# Patient Record
Sex: Female | Born: 1935 | Race: White | Hispanic: No | Smoking: Former smoker
Health system: Southern US, Community
[De-identification: ages and names within clinical notes are randomized; demographics above are authoritative.]

## PROBLEM LIST (undated history)

## (undated) DIAGNOSIS — R600 Localized edema: Secondary | ICD-10-CM

## (undated) DIAGNOSIS — K5909 Other constipation: Secondary | ICD-10-CM

## (undated) DIAGNOSIS — M810 Age-related osteoporosis without current pathological fracture: Secondary | ICD-10-CM

## (undated) DIAGNOSIS — L570 Actinic keratosis: Secondary | ICD-10-CM

## (undated) DIAGNOSIS — I340 Nonrheumatic mitral (valve) insufficiency: Secondary | ICD-10-CM

## (undated) DIAGNOSIS — M659 Unspecified synovitis and tenosynovitis, unspecified site: Secondary | ICD-10-CM

## (undated) DIAGNOSIS — M1990 Unspecified osteoarthritis, unspecified site: Secondary | ICD-10-CM

## (undated) DIAGNOSIS — H3530 Unspecified macular degeneration: Secondary | ICD-10-CM

## (undated) DIAGNOSIS — I5032 Chronic diastolic (congestive) heart failure: Secondary | ICD-10-CM

## (undated) DIAGNOSIS — K219 Gastro-esophageal reflux disease without esophagitis: Secondary | ICD-10-CM

## (undated) DIAGNOSIS — E871 Hypo-osmolality and hyponatremia: Secondary | ICD-10-CM

## (undated) DIAGNOSIS — E785 Hyperlipidemia, unspecified: Secondary | ICD-10-CM

## (undated) DIAGNOSIS — I10 Essential (primary) hypertension: Secondary | ICD-10-CM

## (undated) DIAGNOSIS — M549 Dorsalgia, unspecified: Secondary | ICD-10-CM

## (undated) HISTORY — DX: Localized edema: R60.0

## (undated) HISTORY — PX: INGUINAL HERNIA REPAIR: SUR1180

## (undated) HISTORY — DX: Actinic keratosis: L57.0

## (undated) HISTORY — DX: Other constipation: K59.09

## (undated) HISTORY — PX: SYNOVECTOMY: SHX133

## (undated) HISTORY — DX: Unspecified synovitis and tenosynovitis, unspecified site: M65.90

## (undated) HISTORY — DX: Nonrheumatic mitral (valve) insufficiency: I34.0

## (undated) HISTORY — PX: REPLACEMENT TOTAL KNEE BILATERAL: SUR1225

## (undated) HISTORY — DX: Age-related osteoporosis without current pathological fracture: M81.0

## (undated) HISTORY — DX: Dorsalgia, unspecified: M54.9

## (undated) HISTORY — DX: Unspecified macular degeneration: H35.30

## (undated) HISTORY — PX: OTHER SURGICAL HISTORY: SHX169

## (undated) HISTORY — DX: Unspecified osteoarthritis, unspecified site: M19.90

## (undated) HISTORY — DX: Synovitis and tenosynovitis, unspecified: M65.9

## (undated) HISTORY — PX: CARPAL TUNNEL RELEASE: SHX101

## (undated) HISTORY — DX: Hyperlipidemia, unspecified: E78.5

## (undated) HISTORY — PX: TONSILLECTOMY: SUR1361

## (undated) HISTORY — DX: Gastro-esophageal reflux disease without esophagitis: K21.9

## (undated) HISTORY — DX: Hypo-osmolality and hyponatremia: E87.1

---

## 1998-05-09 ENCOUNTER — Ambulatory Visit (HOSPITAL_COMMUNITY): Admission: RE | Admit: 1998-05-09 | Discharge: 1998-05-09 | Payer: Self-pay | Admitting: Internal Medicine

## 1998-06-03 ENCOUNTER — Emergency Department (HOSPITAL_COMMUNITY): Admission: EM | Admit: 1998-06-03 | Discharge: 1998-06-03 | Payer: Self-pay | Admitting: Emergency Medicine

## 1998-06-14 ENCOUNTER — Ambulatory Visit (HOSPITAL_COMMUNITY): Admission: RE | Admit: 1998-06-14 | Discharge: 1998-06-14 | Payer: Self-pay | Admitting: *Deleted

## 1999-07-23 ENCOUNTER — Encounter: Payer: Self-pay | Admitting: Internal Medicine

## 1999-07-23 ENCOUNTER — Ambulatory Visit (HOSPITAL_COMMUNITY): Admission: RE | Admit: 1999-07-23 | Discharge: 1999-07-23 | Payer: Self-pay | Admitting: Internal Medicine

## 1999-08-18 ENCOUNTER — Ambulatory Visit (HOSPITAL_COMMUNITY): Admission: RE | Admit: 1999-08-18 | Discharge: 1999-08-18 | Payer: Self-pay | Admitting: *Deleted

## 1999-09-29 ENCOUNTER — Encounter: Payer: Self-pay | Admitting: *Deleted

## 1999-09-29 ENCOUNTER — Ambulatory Visit (HOSPITAL_COMMUNITY): Admission: RE | Admit: 1999-09-29 | Discharge: 1999-09-29 | Payer: Self-pay | Admitting: *Deleted

## 2000-05-20 ENCOUNTER — Ambulatory Visit (HOSPITAL_COMMUNITY): Admission: RE | Admit: 2000-05-20 | Discharge: 2000-05-20 | Payer: Self-pay | Admitting: *Deleted

## 2000-05-20 ENCOUNTER — Encounter: Payer: Self-pay | Admitting: *Deleted

## 2001-06-09 ENCOUNTER — Encounter: Payer: Self-pay | Admitting: Orthopedic Surgery

## 2001-06-14 ENCOUNTER — Inpatient Hospital Stay (HOSPITAL_COMMUNITY): Admission: RE | Admit: 2001-06-14 | Discharge: 2001-06-20 | Payer: Self-pay | Admitting: Orthopedic Surgery

## 2001-06-20 ENCOUNTER — Inpatient Hospital Stay
Admission: RE | Admit: 2001-06-20 | Discharge: 2001-06-27 | Payer: Self-pay | Admitting: Physical Medicine & Rehabilitation

## 2001-07-13 ENCOUNTER — Encounter: Admission: RE | Admit: 2001-07-13 | Discharge: 2001-08-05 | Payer: Self-pay | Admitting: Orthopedic Surgery

## 2001-12-15 ENCOUNTER — Encounter: Payer: Self-pay | Admitting: Orthopedic Surgery

## 2001-12-20 ENCOUNTER — Inpatient Hospital Stay (HOSPITAL_COMMUNITY): Admission: RE | Admit: 2001-12-20 | Discharge: 2001-12-26 | Payer: Self-pay | Admitting: Orthopedic Surgery

## 2001-12-26 ENCOUNTER — Inpatient Hospital Stay (HOSPITAL_COMMUNITY)
Admission: RE | Admit: 2001-12-26 | Discharge: 2002-01-03 | Payer: Self-pay | Admitting: Physical Medicine & Rehabilitation

## 2002-01-18 ENCOUNTER — Encounter: Admission: RE | Admit: 2002-01-18 | Discharge: 2002-04-18 | Payer: Self-pay | Admitting: Orthopedic Surgery

## 2002-02-24 ENCOUNTER — Encounter: Admission: RE | Admit: 2002-02-24 | Discharge: 2002-02-24 | Payer: Self-pay | Admitting: Internal Medicine

## 2002-02-24 ENCOUNTER — Encounter: Payer: Self-pay | Admitting: Internal Medicine

## 2002-08-01 ENCOUNTER — Ambulatory Visit (HOSPITAL_BASED_OUTPATIENT_CLINIC_OR_DEPARTMENT_OTHER): Admission: RE | Admit: 2002-08-01 | Discharge: 2002-08-01 | Payer: Self-pay | Admitting: Orthopedic Surgery

## 2002-08-17 ENCOUNTER — Other Ambulatory Visit: Admission: RE | Admit: 2002-08-17 | Discharge: 2002-08-17 | Payer: Self-pay | Admitting: Internal Medicine

## 2003-12-21 ENCOUNTER — Observation Stay (HOSPITAL_COMMUNITY): Admission: RE | Admit: 2003-12-21 | Discharge: 2003-12-23 | Payer: Self-pay | Admitting: General Surgery

## 2004-04-24 ENCOUNTER — Ambulatory Visit (HOSPITAL_COMMUNITY): Admission: RE | Admit: 2004-04-24 | Discharge: 2004-04-24 | Payer: Self-pay | Admitting: Internal Medicine

## 2004-06-09 ENCOUNTER — Ambulatory Visit (HOSPITAL_COMMUNITY): Admission: RE | Admit: 2004-06-09 | Discharge: 2004-06-09 | Payer: Self-pay | Admitting: *Deleted

## 2004-07-27 ENCOUNTER — Emergency Department (HOSPITAL_COMMUNITY): Admission: EM | Admit: 2004-07-27 | Discharge: 2004-07-27 | Payer: Self-pay | Admitting: Emergency Medicine

## 2004-08-31 ENCOUNTER — Emergency Department (HOSPITAL_COMMUNITY): Admission: EM | Admit: 2004-08-31 | Discharge: 2004-08-31 | Payer: Self-pay | Admitting: Emergency Medicine

## 2005-10-09 ENCOUNTER — Other Ambulatory Visit: Admission: RE | Admit: 2005-10-09 | Discharge: 2005-10-09 | Payer: Self-pay | Admitting: Internal Medicine

## 2005-12-08 ENCOUNTER — Encounter: Admission: RE | Admit: 2005-12-08 | Discharge: 2005-12-08 | Payer: Self-pay | Admitting: Internal Medicine

## 2006-07-15 ENCOUNTER — Emergency Department (HOSPITAL_COMMUNITY): Admission: EM | Admit: 2006-07-15 | Discharge: 2006-07-15 | Payer: Self-pay | Admitting: Emergency Medicine

## 2007-01-04 ENCOUNTER — Ambulatory Visit (HOSPITAL_COMMUNITY): Admission: RE | Admit: 2007-01-04 | Discharge: 2007-01-04 | Payer: Self-pay | Admitting: *Deleted

## 2007-09-22 ENCOUNTER — Encounter: Admission: RE | Admit: 2007-09-22 | Discharge: 2007-09-22 | Payer: Self-pay | Admitting: Orthopedic Surgery

## 2007-09-27 ENCOUNTER — Encounter (INDEPENDENT_AMBULATORY_CARE_PROVIDER_SITE_OTHER): Payer: Self-pay | Admitting: Orthopedic Surgery

## 2007-09-27 ENCOUNTER — Ambulatory Visit (HOSPITAL_BASED_OUTPATIENT_CLINIC_OR_DEPARTMENT_OTHER): Admission: RE | Admit: 2007-09-27 | Discharge: 2007-09-27 | Payer: Self-pay | Admitting: Orthopedic Surgery

## 2009-02-19 ENCOUNTER — Ambulatory Visit (HOSPITAL_COMMUNITY): Admission: RE | Admit: 2009-02-19 | Discharge: 2009-02-19 | Payer: Self-pay | Admitting: *Deleted

## 2009-09-09 ENCOUNTER — Encounter: Payer: Self-pay | Admitting: Cardiovascular Disease

## 2010-01-07 ENCOUNTER — Encounter: Payer: Self-pay | Admitting: Cardiovascular Disease

## 2010-01-14 ENCOUNTER — Encounter: Admission: RE | Admit: 2010-01-14 | Discharge: 2010-01-14 | Payer: Self-pay | Admitting: Internal Medicine

## 2010-01-14 ENCOUNTER — Encounter: Payer: Self-pay | Admitting: Cardiovascular Disease

## 2010-01-15 DIAGNOSIS — K219 Gastro-esophageal reflux disease without esophagitis: Secondary | ICD-10-CM

## 2010-01-15 DIAGNOSIS — Z8719 Personal history of other diseases of the digestive system: Secondary | ICD-10-CM

## 2010-01-15 DIAGNOSIS — R0789 Other chest pain: Secondary | ICD-10-CM

## 2010-01-15 DIAGNOSIS — M810 Age-related osteoporosis without current pathological fracture: Secondary | ICD-10-CM

## 2010-01-15 DIAGNOSIS — M1990 Unspecified osteoarthritis, unspecified site: Secondary | ICD-10-CM

## 2010-01-15 DIAGNOSIS — M549 Dorsalgia, unspecified: Secondary | ICD-10-CM | POA: Insufficient documentation

## 2010-01-16 ENCOUNTER — Ambulatory Visit: Payer: Self-pay | Admitting: Cardiovascular Disease

## 2010-01-16 DIAGNOSIS — R011 Cardiac murmur, unspecified: Secondary | ICD-10-CM | POA: Insufficient documentation

## 2010-01-22 ENCOUNTER — Encounter: Admission: RE | Admit: 2010-01-22 | Discharge: 2010-01-22 | Payer: Self-pay | Admitting: Gastroenterology

## 2010-02-03 ENCOUNTER — Telehealth (INDEPENDENT_AMBULATORY_CARE_PROVIDER_SITE_OTHER): Payer: Self-pay | Admitting: Radiology

## 2010-02-04 ENCOUNTER — Ambulatory Visit: Payer: Self-pay

## 2010-02-04 ENCOUNTER — Encounter: Payer: Self-pay | Admitting: Cardiovascular Disease

## 2010-02-04 ENCOUNTER — Ambulatory Visit: Payer: Self-pay | Admitting: Cardiovascular Disease

## 2010-02-04 ENCOUNTER — Ambulatory Visit (HOSPITAL_COMMUNITY): Admission: RE | Admit: 2010-02-04 | Discharge: 2010-02-04 | Payer: Self-pay | Admitting: Cardiovascular Disease

## 2010-02-10 ENCOUNTER — Ambulatory Visit: Payer: Self-pay | Admitting: Cardiovascular Disease

## 2010-02-10 DIAGNOSIS — I059 Rheumatic mitral valve disease, unspecified: Secondary | ICD-10-CM | POA: Insufficient documentation

## 2010-02-24 ENCOUNTER — Emergency Department (HOSPITAL_COMMUNITY): Admission: EM | Admit: 2010-02-24 | Discharge: 2010-02-25 | Payer: Self-pay | Admitting: Emergency Medicine

## 2010-12-21 ENCOUNTER — Encounter: Payer: Self-pay | Admitting: *Deleted

## 2010-12-30 NOTE — Assessment & Plan Note (Signed)
Summary: NP6/ CHESTPRESSURE/ PT HAS MEDICARE. & CONT/ GD   Visit Type:  new pt visit Primary Provider:  Merri Brunette  CC:  chest tightness...sob....denies any edema.  History of Present Illness: 77 yo WF with history of hyperlipidemia and multiple medical problems as listed below who is here today for further evaluation of chest pressure. She first noticed it three weeks ago. It is described as a pressure that occurs at rest. There is no radiation of the pain. She has noticed some SOB lately although this is not related to the chest pressure. She seems to be more easily fatigued now with minimal exertion.  She denies any dizziness, near syncope or syncope. There has been some nausea. She has had no previous coronary workup but per report she had an echo in 2003 that showed mild mitral stenosis. She is seeing GI tomorrow for further workup of abdominal pain.   Current Medications (verified): 1)  Hydrocodone-Acetaminophen 7.5-500 Mg Tabs (Hydrocodone-Acetaminophen) .Marland Kitchen.. 1 Tab Four Times Daily 2)  Prevacid 30 Mg Cpdr (Lansoprazole) .Marland Kitchen.. 1 Tab Once Daily 3)  Zyrtec Allergy 10 Mg Tabs (Cetirizine Hcl) .Marland Kitchen.. 1 Tab Once Daily 4)  Preservision/lutein  Caps (Multiple Vitamins-Minerals) .... 2 Caps Once Daily 5)  Citrucel 500 Mg Tabs (Methylcellulose (Laxative)) .... 2 Tab Once Daily 6)  Vitamin D 1000 Unit Tabs (Cholecalciferol) .Marland Kitchen.. 1 Tab Once Daily 7)  Multivitamins   Tabs (Multiple Vitamin) .Marland Kitchen.. 1 Tab Once Daily 8)  Vitamin B-12 500 Mcg Tabs (Cyanocobalamin) .Marland Kitchen.. 1 Tab Once Daily 9)  Magnesium 250 Mg Tabs (Magnesium) .Marland Kitchen.. 1 Tab Once Daily 10)  Fish Oil 1000 Mg Caps (Omega-3 Fatty Acids) .Marland Kitchen.. 1 Cap Once Daily  Allergies: 1)  ! Asa 2)  ! Penicillin 3)  ! Sulfa 4)  ! Morphine  Past History:  Past Medical History: Current Problems:  Hyperlipidemia Mild mitral regurgitation by echo 2003 Precancerous area of cervix. Allergic rhinitis Actinic keratosis Myxomatous tenosynovitis Chronic  constipation GERD (ICD-530.81) OSTEOARTHRITIS (ICD-715.90) OSTEOPOROSIS (ICD-733.00) BACK PAIN, CHRONIC (ICD-724.5) Macular degeneration  Past Surgical History: Synovectomy of right long finger Laparoscopic preperitoneal repair of bilateral inguinal hernias Bilateral carpal tunnel repair Left and  right knee replacement Tonsillectomy  Family History: Reviewed history from 01/15/2010 and no changes required.  Her mother passed away at age 52 with old age after fracturing her hip.   Her father died at the age of 36 with heart attack. She has two brothers who passed away both from MI; one at age 25, one at age 55. She has two sisters who are alive and well.  Social History: Reviewed history from 01/15/2010 and no changes required. Divorced.  Four children No tobacco use. 2 glasses of wine per week.  No illicit drug use.   Born in [place].  She currently lives with her son.  Review of Systems       The patient complains of fatigue, chest pain, shortness of breath, abdominal pain, nausea, and joint pain.  The patient denies malaise, fever, weight gain/loss, vision loss, decreased hearing, hoarseness, palpitations, prolonged cough, wheezing, sleep apnea, coughing up blood, blood in stool, vomiting, diarrhea, heartburn, incontinence, blood in urine, muscle weakness, leg swelling, rash, skin lesions, headache, fainting, dizziness, depression, anxiety, enlarged lymph nodes, easy bruising or bleeding, and environmental allergies.    Vital Signs:  Patient profile:   77 year old female Height:      61.5 inches Weight:      155 pounds BMI:     28.92 Pulse  rate:   88 / minute Pulse rhythm:   regular BP sitting:   130 / 90  (left arm) Cuff size:   regular  Vitals Entered By: Danielle Rankin, CMA (January 16, 2010 10:17 AM)  Physical Exam  General:  General: Well developed, well nourished, NAD HEENT: OP clear, mucus membranes moist SKIN: warm, dry Neuro: No focal  deficits Musculoskeletal: Muscle strength 5/5 all ext Psychiatric: Mood and affect normal Neck: No JVD, no carotid bruits, no thyromegaly, no lymphadenopathy. Lungs:Clear bilaterally, no wheezes, rhonci, crackles CV: RRR with soft systolic  murmur at the LSB. NO  gallops rubs Abdomen: soft, NT, ND, BS present Extremities: No edema, pulses 2+.    EKG  Procedure date:  01/14/2010  Findings:      NSR, rate 76 bpm. LAD. No ischemic changes.   Impression & Recommendations:  Problem # 1:  CHEST DISCOMFORT (ICD-786.59) Typical and atypical features. She does have a history of hyperlipidemia and a strong family history of CAD. Will arrange Lexiscan stress myoview to assess for coronary ischemia. Her symptoms may be related to her chronic GI issues which include GERD.  Orders: Echocardiogram (Echo) Nuclear Stress Test (Nuc Stress Test)  Problem # 2:  MURMUR (ICD-785.2) Previous documentation of mild mitral regurgitation. Currently has soft systolic murmur. Will get echocardiogram to evaluate further.  Patient Instructions: 1)  Your physician recommends that you schedule a follow-up appointment in: 3-4 weeks 2)  Your physician has requested that you have an echocardiogram.  Echocardiography is a painless test that uses sound waves to create images of your heart. It provides your doctor with information about the size and shape of your heart and how well your heart's chambers and valves are working.  This procedure takes approximately one hour. There are no restrictions for this procedure. 3)  Your physician has requested that you have an adenosine myoview.  For further information please visit [email or web address]  Please follow instruction sheet, as given.

## 2010-12-30 NOTE — Letter (Signed)
Summary: [facility] Office Note  [facility] Office Note   Imported By: Roderic Ovens 02/26/2010 12:01:35  _____________________________________________________________________  External Attachment:    Type:   Image     Comment:   External Document

## 2010-12-30 NOTE — Letter (Signed)
Summary: [facility] Office Note  [facility] Office Note   Imported By: Roderic Ovens 02/26/2010 11:59:01  _____________________________________________________________________  External Attachment:    Type:   Image     Comment:   External Document

## 2010-12-30 NOTE — Assessment & Plan Note (Signed)
Summary: f/u echo/myoview done 02-04-10 ok per pat   Visit Type:  f/u echo/myoview Primary Provider:  Merri Brunette  CC:  Occasional chest pain.  History of Present Illness: 77 yo WF with history of hyperlipidemia and multiple medical problems as listed below who is here today for further evaluation of chest pressure. She first noticed it three weeks ago. It is described as a pressure that occurs at rest. There is no radiation of the pain. She has noticed some SOB lately although this is not related to the chest pressure. She seems to be more easily fatigued now with minimal exertion.  She denies any dizziness, near syncope or syncope. There has been some nausea. She has had no previous coronary workup but per report she had an echo in 2003 that showed mild mitral stenosis.   She is here today for follow up. She has had no change in her symptoms. She is still having occasional burning in her chest related to meals. She is seeing Dr. Dulce Sellar from GI for further workup. Her stress test showed no ischemia. Her echo is outlined below.    Current Medications (verified): 1)  Hydrocodone-Acetaminophen 7.5-500 Mg Tabs (Hydrocodone-Acetaminophen) .Marland Kitchen.. 1 Tab Four Times Daily 2)  Zyrtec Allergy 10 Mg Tabs (Cetirizine Hcl) .Marland Kitchen.. 1 Tab Once Daily 3)  Preservision/lutein  Caps (Multiple Vitamins-Minerals) .Marland Kitchen.. 1 Cap Two Times A Day 4)  Citracal/vitamin D 250-200 Mg-Unit Tabs (Calcium Citrate-Vitamin D) .Marland Kitchen.. 1 Tab Two Times A Day 5)  Vitamin D 1000 Unit Tabs (Cholecalciferol) .Marland Kitchen.. 1 Tab Once Daily 6)  Multivitamins   Tabs (Multiple Vitamin) .Marland Kitchen.. 1 Tab Once Daily 7)  Vitamin B-12 500 Mcg Tabs (Cyanocobalamin) .Marland Kitchen.. 1 Tab Once Daily 8)  Magnesium 250 Mg Tabs (Magnesium) .Marland Kitchen.. 1 Tab Once Daily 9)  Fish Oil 1000 Mg Caps (Omega-3 Fatty Acids) .Marland Kitchen.. 1 Cap Once Daily 10)  Benefiber  Powd (Wheat Dextrin) .... 2 Tsp Two Times A Day  Allergies: 1)  ! Asa 2)  ! Penicillin 3)  ! Sulfa 4)  ! Morphine  Past  History:  Past Medical History: Reviewed history from 01/16/2010 and no changes required. Current Problems:  Hyperlipidemia Mild mitral regurgitation by echo 2003 Precancerous area of cervix. Allergic rhinitis Actinic keratosis Myxomatous tenosynovitis Chronic constipation GERD (ICD-530.81) OSTEOARTHRITIS (ICD-715.90) OSTEOPOROSIS (ICD-733.00) BACK PAIN, CHRONIC (ICD-724.5) Macular degeneration  Social History: Reviewed history from 01/16/2010 and no changes required. Divorced.  Four children No tobacco use. 2 glasses of wine per week.  No illicit drug use.   Born in [place].  She currently lives with her son.  Review of Systems       The patient complains of heartburn.  The patient denies fatigue, malaise, fever, weight gain/loss, vision loss, decreased hearing, hoarseness, chest pain, palpitations, shortness of breath, prolonged cough, wheezing, sleep apnea, coughing up blood, abdominal pain, blood in stool, nausea, vomiting, diarrhea, incontinence, blood in urine, muscle weakness, joint pain, leg swelling, rash, skin lesions, headache, fainting, dizziness, depression, anxiety, enlarged lymph nodes, easy bruising or bleeding, and environmental allergies.    Vital Signs:  Patient profile:   77 year old female Height:      61.5 inches Weight:      155 pounds BMI:     28.92 Pulse rate:   88 / minute Pulse rhythm:   regular BP sitting:   114 / 80  (left arm) Cuff size:   large  Vitals Entered By: Danielle Rankin, CMA (February 10, 2010 3:42 PM)  Physical Exam  General:  General: Well developed, well nourished, NAD Psychiatric: Mood and affect normal Neck: No JVD, no carotid bruits, no thyromegaly, no lymphadenopathy. Lungs:Clear bilaterally, no wheezes, rhonci, crackles CV: RRR no murmurs, gallops rubs Abdomen: soft, NT, ND, BS present Extremities: No edema, pulses 2+.    Echocardiogram  Procedure date:  02/04/2010  Findings:      - Left ventricle: The cavity size was  normal. There was mild focal       basal hypertrophy of the septum. Systolic function was normal. The       estimated ejection fraction was in the range of 55% to 60%. Wall       motion was normal; there were no regional wall motion       abnormalities. Doppler parameters are consistent with abnormal       left ventricular relaxation (grade 1 diastolic dysfunction).     - Mitral valve: Mildly to moderately calcified annulus. Mild       regurgitation.     - Atrial septum: A patent foramen ovale cannot be excluded.     - Pulmonary arteries: PA peak pressure: 32mm Hg (S).  Nuclear Study  Procedure date:  02/04/2010  Findings:      Stress Procedure   The patient received IV Lexiscan 0.4 mg over 15-seconds.  Myoview injected at 30-seconds.  There were no significant changes and freq pvs in pretest and recovery with infusion.  Quantitative spect images were obtained after a 45 minute delay.  QPS  Raw Data Images:  Normal; no motion artifact; normal heart/lung ratio. Stress Images:  NI: Uniform and normal uptake of tracer in all myocardial segments. Rest Images:  Normal homogeneous uptake in all areas of the myocardium. Subtraction (SDS):  Normal Transient Ischemic Dilatation:  1.03  (Normal <1.22)  Lung/Heart Ratio:  .30  (Normal <0.45)  Quantitative Gated Spect Images  QGS EDV:  65 ml QGS ESV:  18 ml QGS EF:  72 % QGS cine images:  normal  Findings  Normal nuclear study   Overall Impression   Exercise Capacity: Lexiscan BP Response: Normal blood pressure response. Clinical Symptoms: Dyspnea ECG Impression: No significant ST segment change suggestive of ischemia. Overall Impression: Normal stress nuclear study.  Impression & Recommendations:  Problem # 1:  CHEST DISCOMFORT (ICD-786.59) No evidence of ischemia on stress testing. She does have mild, grade 1 diastolic dysfunction but normal LV systolic function. Her blood pressure is well controlled. No further workup at this  time.   Problem # 2:  MITRAL VALVE DISORDERS (ICD-424.0) Moderate annular calcification with mild regurgitation. Will repeat echo one year.   Patient Instructions: 1)  Your physician recommends that you schedule a follow-up appointment in: 12 months 2)  Your physician has requested that you have an echocardiogram.  Echocardiography is a painless test that uses sound waves to create images of your heart. It provides your doctor with information about the size and shape of your heart and how well your heart's chambers and valves are working.  This procedure takes approximately one hour. There are no restrictions for this procedure. To be done in 12 months

## 2010-12-30 NOTE — Assessment & Plan Note (Signed)
Summary: Cardiology Nuclear Study  Nuclear Med Background Indications for Stress Test: Evaluation for Ischemia   History: Echo  History Comments: 2003- Echo- Mild MR  Symptoms: Chest Pressure, Fatigue with Exertion, Palpitations, SOB    Nuclear Pre-Procedure Cardiac Risk Factors: Family History - CAD, Lipids Caffeine/Decaff Intake: None NPO After: 6:00 PM Lungs: clear IV 0.9% NS with Angio Cath: 22g     IV Site: (R) AC IV Started by: Irean Hong RN Chest Size (in) 38     Cup Size D     Height (in): 61.5 Weight (lb): 150 BMI: 27.98  Nuclear Med Study 1 or 2 day study:  1 day     Stress Test Type:  Eugenie Birks Reading MD:  Charlton Haws, MD     Referring MD:  C.McAlhany Resting Radionuclide:  Technetium 57m Tetrofosmin     Resting Radionuclide Dose:  11.0 mCi  Stress Radionuclide:  Technetium 37m Tetrofosmin     Stress Radionuclide Dose:  33.0 mCi   Stress Protocol   Lexiscan: 0.4 mg   Stress Test Technologist:  Milana Na EMT-P     Nuclear Technologist:  Burna Mortimer Deal RT-N  Rest Procedure  Myocardial perfusion imaging was performed at rest 45 minutes following the intravenous administration of Myoview Technetium 74m Tetrofosmin.  Stress Procedure  The patient received IV Lexiscan 0.4 mg over 15-seconds.  Myoview injected at 30-seconds.  There were no significant changes and freq pvs in pretest and recovery with infusion.  Quantitative spect images were obtained after a 45 minute delay.  QPS Raw Data Images:  Normal; no motion artifact; normal heart/lung ratio. Stress Images:  NI: Uniform and normal uptake of tracer in all myocardial segments. Rest Images:  Normal homogeneous uptake in all areas of the myocardium. Subtraction (SDS):  Normal Transient Ischemic Dilatation:  1.03  (Normal <1.22)  Lung/Heart Ratio:  .30  (Normal <0.45)  Quantitative Gated Spect Images QGS EDV:  65 ml QGS ESV:  18 ml QGS EF:  72 % QGS cine images:  normal  Findings Normal nuclear  study      Overall Impression  Exercise Capacity: Lexiscan BP Response: Normal blood pressure response. Clinical Symptoms: Dyspnea ECG Impression: No significant ST segment change suggestive of ischemia. Overall Impression: Normal stress nuclear study.  Appended Document: Cardiology Nuclear Study Normal. cdm  Appended Document: Cardiology Nuclear Study Dr. Clifton James reviewed with pt at office visit 02/10/10

## 2010-12-30 NOTE — Progress Notes (Signed)
Summary: Nuc Pre-Procedure  Phone Note Outgoing Call Call back at Home Phone [phone]   Call placed by: Harlow Asa, CNMT Call placed to: Patient Reason for Call: Confirm/change Appt Summary of Call: Left message with information on Myoview Information Sheet (see scanned document for details).      Nuclear Med Background Indications for Stress Test: Evaluation for Ischemia   History: Echo  History Comments: 2003- Echo- Mild MR  Symptoms: Chest Pressure, Fatigue with Exertion, SOB    Nuclear Pre-Procedure Cardiac Risk Factors: Family History - CAD, Lipids Height (in): 61.5

## 2011-02-03 ENCOUNTER — Ambulatory Visit (HOSPITAL_COMMUNITY): Payer: Medicare Other | Attending: Cardiovascular Disease

## 2011-02-03 DIAGNOSIS — I059 Rheumatic mitral valve disease, unspecified: Secondary | ICD-10-CM | POA: Insufficient documentation

## 2011-02-03 DIAGNOSIS — R079 Chest pain, unspecified: Secondary | ICD-10-CM | POA: Insufficient documentation

## 2011-02-03 DIAGNOSIS — E785 Hyperlipidemia, unspecified: Secondary | ICD-10-CM | POA: Insufficient documentation

## 2011-02-03 DIAGNOSIS — R011 Cardiac murmur, unspecified: Secondary | ICD-10-CM | POA: Insufficient documentation

## 2011-02-03 DIAGNOSIS — R072 Precordial pain: Secondary | ICD-10-CM

## 2011-02-18 ENCOUNTER — Encounter: Payer: Self-pay | Admitting: Cardiovascular Disease

## 2011-02-22 LAB — URINE CULTURE
Colony Count: NO GROWTH
Culture: NO GROWTH

## 2011-02-22 LAB — URINE MICROSCOPIC-ADD ON

## 2011-02-22 LAB — POCT I-STAT, CHEM 8
BUN: 11 mg/dL (ref 6–23)
Hemoglobin: 13.6 g/dL (ref 12.0–15.0)
Potassium: 3.3 mEq/L — ABNORMAL LOW (ref 3.5–5.1)
Sodium: 133 mEq/L — ABNORMAL LOW (ref 135–145)
TCO2: 26 mmol/L (ref 0–100)

## 2011-02-22 LAB — URINALYSIS, ROUTINE W REFLEX MICROSCOPIC
Bilirubin Urine: NEGATIVE
Glucose, UA: NEGATIVE mg/dL
Ketones, ur: NEGATIVE mg/dL
Nitrite: NEGATIVE
Protein, ur: NEGATIVE mg/dL
Specific Gravity, Urine: 1.011 (ref 1.005–1.030)
Urobilinogen, UA: 0.2 mg/dL (ref 0.0–1.0)
pH: 8 (ref 5.0–8.0)

## 2011-02-22 LAB — DIFFERENTIAL
Basophils Absolute: 0.1 10*3/uL (ref 0.0–0.1)
Eosinophils Relative: 0 % (ref 0–5)
Lymphocytes Relative: 19 % (ref 12–46)
Lymphs Abs: 1.8 10*3/uL (ref 0.7–4.0)
Neutrophils Relative %: 73 % (ref 43–77)

## 2011-02-22 LAB — CBC
HCT: 37.2 % (ref 36.0–46.0)
Hemoglobin: 12.8 g/dL (ref 12.0–15.0)
MCHC: 34.4 g/dL (ref 30.0–36.0)
MCV: 91 fL (ref 78.0–100.0)
Platelets: 304 10*3/uL (ref 150–400)
RBC: 4.09 MIL/uL (ref 3.87–5.11)
RDW: 12.6 % (ref 11.5–15.5)
WBC: 9.4 10*3/uL (ref 4.0–10.5)

## 2011-02-25 ENCOUNTER — Ambulatory Visit: Payer: Medicare Other | Admitting: Cardiovascular Disease

## 2011-03-02 ENCOUNTER — Ambulatory Visit (INDEPENDENT_AMBULATORY_CARE_PROVIDER_SITE_OTHER): Payer: Medicare Other | Admitting: Cardiovascular Disease

## 2011-03-02 ENCOUNTER — Encounter: Payer: Self-pay | Admitting: Cardiovascular Disease

## 2011-03-02 VITALS — BP 130/70 | HR 76 | Resp 18 | Ht 61.0 in | Wt 153.0 lb

## 2011-03-02 DIAGNOSIS — I059 Rheumatic mitral valve disease, unspecified: Secondary | ICD-10-CM

## 2011-03-02 NOTE — Assessment & Plan Note (Signed)
Stable mitral valve disease. No change since last year. I would not recommend any repeat echo as her mitral valve disease is mild. I will see her back as needed.

## 2011-03-02 NOTE — Progress Notes (Signed)
History of Present Illness:77 yo WF with history of hyperlipidemia and multiple medical problems who is here today for follow up.  I saw her one year ago at which time she had c/o resting chest pressure.  It was  described as a pressure that occured at rest. There was no radiation of the pain. Stress test showed no ischemia. Echo showed normal LV systolic function with mitral annular calcification and mild MR. She had a repeat echo last month that shows normal LV function and stable mitral valve disease. She tells me that she feels well. She denies any chest pain, SOB, dizziness, near syncope or syncope. She is mostly limited by her back pain. She recently had cataract surgery on her right eye and it went well.    Past Medical History  Diagnosis Date  . Hyperlipidemia   . Mitral regurgitation     mild by echo 2003  . Allergic rhinitis   . Actinic keratosis   . Tenosynovitis     myxomatous  . Chronic constipation   . GERD (gastroesophageal reflux disease)   . Osteoarthritis   . Back pain   . Macular degeneration     Past Surgical History  Procedure Date  . Synovectomy     of right long finger  . Hernial repain   . Inguinal hernia repair     laparoscopic preperitoneal repair of bilateral inguinal hernias  . Carpal tunnel release     bilateral  . Replacement total knee bilateral   . Tonisellectomy   . Tonisillectomy   . Tonsillectomy     Current Outpatient Prescriptions  Medication Sig Dispense Refill  . Calcium-Magnesium-Vitamin D (CITRACAL CALCIUM+D) 600-40-500 MG-MG-UNIT TB24 Take 2 capsules by mouth 2 (two) times daily.        . cetirizine (ZYRTEC) 10 MG tablet Take 10 mg by mouth daily.        . fish oil-omega-3 fatty acids 1000 MG capsule Take 1 g by mouth daily.        Marland Kitchen HYDROcodone-acetaminophen (LORTAB) 7.5-500 MG per tablet 1 tablet. 1 tab four times daily       . Magnesium 250 MG TABS Take by mouth.        . Multiple Vitamin (MULTIVITAMIN) capsule Take 1 capsule by  mouth daily.        . Multiple Vitamins-Minerals (VISION-VITE PRESERVE PO) Take by mouth.        . olmesartan (BENICAR) 20 MG tablet Take 20 mg by mouth daily.        Marland Kitchen omeprazole (PRILOSEC) 20 MG capsule Take 20 mg by mouth daily.        . Ranibizumab (LUCENTIS) 0.5 MG/0.05ML SOLN Inject into the eye. Every 6 weeks        . vitamin B-12 (CYANOCOBALAMIN) 500 MCG tablet Take 500 mcg by mouth daily.        . Cholecalciferol (VITAMIN D3) 1000 UNITS tablet Take 1,000 Units by mouth daily.        . Multiple Vitamins-Minerals (PRESERVISION AREDS 2 PO) Take by mouth 2 (two) times daily.        . Wheat Dextrin (BENEFIBER DRINK MIX PO) Take by mouth. 2 tsp two times a day       . DISCONTD: Calcium Citrate-Vitamin D 250-200 MG-UNIT TABS Take by mouth. 1 tab two times a day         Allergies  Allergen Reactions  . Aspirin   . Morphine   . Penicillins   . Sulfonamide  Derivatives     History   Social History  . Marital Status: Divorced    Spouse Name: N/A    Number of Children: 4  . Years of Education: N/A   Occupational History  . Not on file.   Social History Main Topics  . Smoking status: Never Smoker   . Smokeless tobacco: Not on file  . Alcohol Use: 1.2 oz/week    2 Glasses of wine per week     2 glasses per week  . Drug Use: No  . Sexually Active:    Other Topics Concern  . Not on file   Social History Narrative  . No narrative on file    Family History  Problem Relation Age of Onset  . Heart attack Father   . Heart attack Brother     Review of Systems:  No chest pain, SOB, palpitations, dizziness,  near syncope or syncope.  No PND, orthopnea, or Lower extremity edema.   BP 160/79  Pulse 76  Resp 18  Ht 5\' 1"  (1.549 m)  Wt 153 lb (69.4 kg)  BMI 28.91 kg/m2  Physical Examination: General: Well developed, well nourished, NAD HEENT: OP clear, mucus membranes moist Musculoskeletal: Muscle strength 5/5 all ext Psychiatric: Mood and affect normal Neck: No JVD,  no carotid bruits, no thyromegaly, no lymphadenopathy. Lungs:Clear bilaterally, no wheezes, rhonci, crackles Cardiovascular: Regular rate and rhythm. No murmurs, gallops or rubs. Abdomen:Soft. Bowel sounds present. Non-tender.  Extremities: No lower extremity edema. Pulses are 1 + in the bilateral DP/PT.  Echo: 02/03/11 Left ventricle: The cavity size was normal. Wall thickness was       normal. Systolic function was vigorous. The estimated ejection       fraction was in the range of 65% to 70%. Wall motion was normal;       there were no regional wall motion abnormalities. Doppler       parameters are consistent with abnormal left ventricular       relaxation (grade 1 diastolic dysfunction).     - Mitral valve: Mild regurgitation.     - Left atrium: The atrium was mildly dilated.

## 2011-03-05 ENCOUNTER — Other Ambulatory Visit: Payer: Self-pay | Admitting: Surgery

## 2011-04-14 NOTE — Op Note (Signed)
[name], [name]               ACCOUNT NO.:  [id number]   MEDICAL RECORD NO.:  [id number]          PATIENT TYPE:  AMB   LOCATION:  ENDO                         FACILITY:  [facility]   PHYSICIAN:  Georgiana Spinner, M.D.    DATE OF BIRTH:  03-04-1926   DATE OF PROCEDURE:  02/19/2009  DATE OF DISCHARGE:                               OPERATIVE REPORT   PROCEDURE:  Upper endoscopy.   INDICATIONS:  GI bleeding.   ANESTHESIA:  Fentanyl 50 mcg, Versed 4 mg.   PROCEDURE:  With the patient mildly sedated in the left lateral  decubitus position, the Pentax videoscopic endoscope was inserted in the  mouth passed under direct vision through the esophagus which appeared  normal into the stomach through a large hiatal hernia.  Fundus, body,  antrum, duodenal bulb, second portion duodenum all appeared normal.  From this point the endoscope was slowly withdrawn taking  circumferential views of duodenal mucosa until the endoscope pulled back  into stomach, placed in retroflexion to view the stomach from below.  The endoscope was straightened and withdrawn taking circumferential  views remaining gastric and esophageal mucosa.  The patient's vital  signs, pulse oximeter remained stable.  The patient tolerated procedure  well without apparent complication.   FINDINGS:  Large hiatal hernia, otherwise negative examination.   PLAN:  Have patient follow up with me as an outpatient.           ______________________________  Georgiana Spinner, M.D.     GMO/MEDQ  D:  02/19/2009  T:  02/19/2009  Job:  [id number]

## 2011-04-14 NOTE — Op Note (Signed)
[name], [name]               ACCOUNT NO.:  [id number]   MEDICAL RECORD NO.:  [id number]          PATIENT TYPE:  AMB   LOCATION:  DSC                          FACILITY:  [facility]   PHYSICIAN:  Alexandra Fitch. Cox, M.D. DATE OF BIRTH:  01-16-1926   DATE OF PROCEDURE:  09/27/2007  DATE OF DISCHARGE:                               OPERATIVE REPORT   DIAGNOSIS:  Chronic synovitis, right long finger with significant loss  of range of motion of right long finger interphalangeal joints due to  background osteoarthrosis coupled with profound synovitis.   POSTOPERATIVE DIAGNOSIS:  Rule out atypical mycobacterial infection  versus chronic inflammatory tenosynovitis.   OPERATION:  Synovectomy of right long finger superficialis and profundus  tendons with synovial biopsy and cultures of synovium for AFB and fungal  smear and culture.   OPERATING SURGEON:  Josephine Igo, M.D.   ASSISTANT:  Alexandra Maduro Dasnoit PA-C.   ANESTHESIA:  General by LMA.   SUPERVISING ANESTHESIOLOGIST:  Dr. Gypsy Balsam.   INDICATIONS:  [name] is a 77 year old woman who is referred for  evaluation and management of a chronic right long finger stiffness.  She  was noted to have swelling and a soft tissue mass in her palm overlying  the flexor sheath.  She was referred by primary care physician, Dr.  Renne Cox for evaluation and management of this predicament.   In the office she was noted to have profound synovitis and marked  stiffness of her fingers.  She has rather dramatic osteoarthrosis of the  small joints of her hands with hypertrophic osteophytes limiting flexion  of all fingers.   Due to her mass and our concern that she may have an atypical infection  versus mitotic lesion, she is brought to the operating room at this time  for synovial biopsy and culture.  Preoperatively she was advised of the  potential risks, benefits of surgery.  She understands our goal was to  sample her synovium and send it for both  histopathologic evaluation and  culture to rule out atypical mycobacteria or fungal infection.  There  are not any significant signs of an acute bacterial infection.  We will  also perform a mechanical synovectomy in an effort to improve range of  motion.   After questions were invited and answered.  She is brought to the  operating room at this time.   PROCEDURE:  [name] was brought to the operating room and placed  in supine position on the operating room and placed supine position on  the table.   Following induction of general anesthesia by LMA technique, the right  arm was prepped with Betadine soap solution, sterilely draped.  Preoperative antibiotics were not provided pending cultures.   Following routine Betadine scrub and paint, the right arm was draped  with a sterile stockinette and impervious arthroscopy drapes.  The arm  was exsanguinated with Esmarch bandage and an arterial tourniquet on the  proximal brachium inflated to 240 mmHg and later elevated to 260 mmHg  due to mild systolic hypertension.   The procedure commenced with planning Brunner zigzag incision exposing  the right long finger flexor sheath from the base of the A4 pulley  distally to the mid palm.   Subcutaneous tissues were carefully divided taking care to release the  palmar fascia.  The flexor sheath was noted to be invested with a  inflammatory tenosynovium and areas of myxoid degenerative change and  cystic myxoid collection.   Several of the myxoid cysts were dissected free en bloc and sent for  pathologic evaluation in formalin.   The flexor tendons were identified proximal to the A1 pulley and a  complete tenosynovectomy accomplished between the synovial fold proximal  to the A1 pulley and the C1 pulley distally.   The superficialis tendon was noted be very edematous with synovium  infiltrating in the inter bundle regions typical of a rheumatoid  insurgency.   The synovium on the  superficialis and profundus tendons was collected  and a portion between saline for the AFB and fungal smears/ cultures in  formalin for routine histopathology.   After the complete synovectomy range of motion the finger was improved,  however, the tendons remained rather swollen and did not move well to  the pulleys.   The synovium had the appearance of either chronic tenosynovitis with  inflammatory even granulomatous features or an atypical infection.   The wound was checked for bleeding points followed by repair of the skin  with corner sutures of 5-0 nylon and mattress sutures of 5-0 nylon.   The wound was dressed with Xeroflo sterile gauze and a voluminous Kerlix  and Ace wrap dressing.   We will begin [name] on doxycycline 100 mg p.o. b.i.d. as a  therapeutic antibiotic pending our culture results.  We may need to  speak to the infectious disease service pending after the laboratory has  a chance to process for AFB fungal smears and cultures.   She was awakened from general anesthesia and transferred to recovery  with stable signs.  She will be discharged with prescriptions for  Vicodin 5 mg one p.o. 4 to 6 hours as needed for pain, 20 tablets  without refill and doxycycline 100 mg one p.o. b.i.d. times 15 days  pending culture results.      [name], M.D.  Electronically Signed     RVS/MEDQ  D:  09/27/2007  T:  09/27/2007  Job:  [id number]   cc:   Soyla Murphy. Renne Cox, M.D.

## 2011-04-17 NOTE — Op Note (Signed)
[place]  Patient:    [name], [name] Visit Number: [id number] MRN: [id number]          Service Type: SUR Location: [id number] Attending Physician:  Cornell Barman Dictated by:   Lenard Galloway Chaney Malling, M.D. Proc. Date: 12/20/01 Admit Date:  12/20/2001                             Operative Report  PREOPERATIVE DIAGNOSIS:  Severe osteoarthritis, left knee.  POSTOPERATIVE DIAGNOSIS:  Severe osteoarthritis, left knee.  PROCEDURE:  Total knee replacement on the left using DePuy components.  All components glued in.  A #3 cemented key tibial tray with a large left cemented femoral component and a rotating three-peg cemented patella, large size, with a 10 mm poly insert.  ANESTHESIA:  General.  SURGEON:  Rodney A. Chaney Malling, M.D.  ASSISTANT:  Arnoldo Morale, P.A.  DRAINS:  Hemovac.  COMPLICATIONS:  None.  DESCRIPTION OF PROCEDURE:  Patient placed on the operating table in a supine position with the pneumatic tourniquet about the left upper thigh.  The entire left lower extremity was prepped with Duraprep and draped out in the usual manner.  The leg was then wrapped out with an Esmarch and the tourniquet was elevated.  An incision made starting above the patella and then carried down to the tibial tubercle.  Skin edges were retracted.  Bleeders were coagulated. A long medial parapatellar incision was then made, and the patella was everted laterally.  Massive osteophytes were seen around the margins of the patella and on both the medial and lateral femoral condyle.  Rongeurs were used to debride this area.  The knee was flexed 90 degrees.  The fat pad was excised. Both the medial and lateral meniscus were excised.  Tibial guide #1 was placed over the anterior aspect of the tibia and placed at what was felt to be the appropriate cutting level.  This was fixed with fixation pins.  The alignment tower showed good alignment of the tibia.   The cutting block was put in place. Then the capture guide was applied.  The proximal tibia was then amputated, and excellent flush cut was achieved.  Minimum bone was suctioned.  Loose bodies were seen, and these were removed.  Femoral guide #1 was placed over the anterior aspect of the femur, and a drill hole was placed.  The intramedullary rod was inserted, and the C clamp was applied to the cutting block with one end placed against the flush cut surface of the tibia.  This lined up in the proper rotation the femoral component.  This was fixed with pin fixation pins.  Anterior and posterior cuts made on the distal femur. Femoral guide #2 was placed over the anterior aspect of the femur, held in position with the intramedullary rod.  Four degrees of valgus was set into this guide.  This was fixed in position with the guide pins, and the femur was amputated.  Once this was completed, the spacer block was inserted and there was an excellent balancing of the ligaments, both in flexion and extension. Collateral ligaments were equally balanced.  Femoral guide #3 was then placed over the distal end of the femur.  This was held in position and locked in position with the fixation pins.  Appropriate cuts were made, and the femoral guide was removed.  Using curved osteotomes, the posterior condyles were removed and large osteophytes removed, and  large loose bodies in the popliteal area above the condyles were then removed.  The tibia was then subluxed anteriorly with a McCale, and a size 3 trial was placed on the proximal end of the tibia.  This was fixed in position with fixation pins.  The tower was inserted, and a drill hole was placed in the proximal tibia.  The tower was removed, and the trial tibial peg was inserted.  A 10 mm spacer trial was put in place and the femoral component placed over the distal end of the femur, and the knee was articulated.  The knee had full flexion, full  extension, excellent stability to varus and valgus stress.  There was no tendency to sublux or stick the poly insert.  The patella was then everted and the patellar guide clamp was placed over the posterior aspect of the patella, and the posterior portion of the patella was amputated.  Three drill holes were placed in the cut surface of the patella, and the patella trial was inserted. The knee was then put through a full range of motion and the knee completely articulated.  A lateral release was not felt to be necessary, as the patella tracked very nicely.  Excellent position of the components and excellent stability were noted.  All the trial components were removed.  A pulsating lavage was inserted, all the debris was removed.  Glue was mixed, and each of the components was inserted sequentially.  Glue was placed on the proximal cut end of the tibia and the tibial component impacted and excess glue removed.  Glue was then placed on the posterior runners of the femoral component and on the distal femur.  The femoral component was then inserted and excess glue was removed.  The trial poly was inserted, and the knee was articulated and held in extension.  Glue was placed in the posterior aspect of the patella and the patellar prosthesis was then inserted and held in place with fixation clamp.  All excess glue was removed. Once the glue had hardened, an osteotome was used to remove all glue that had seeped out from the sides of the components.  All debris was removed very nicely.  At this point the trial poly was removed, the tourniquet was dropped, and all bleeders were coagulated.  A fairly dry field was obtained.  A Hemovac drain was then placed in the knee.  The final poly was then inserted and articulated with all the components.  Again the knee was put through a full range of motion.  There was excellent stability with a full range of motion. With the knee flexed 90 degrees,  interrupted heavy Ethibond suture was used to close the long median parapatellar incision.  Vicryl was used to close the subcutaneous tissue, and the skin was closed with stainless steel staples.  Throughout the procedure the knee had been irrigated with copious amounts of antibiotic solution.  Sterile dressings were applied.  The patient returned to the recovery room in excellent condition.  Technically this procedure went extremely well. Dictated by:   Lenard Galloway Chaney Malling, M.D. Attending Physician:  Cornell Barman DD:  12/20/01 TD:  12/21/01 Job: [id number]

## 2011-04-17 NOTE — Op Note (Signed)
[name], [name]                         ACCOUNT NO.:  [id number]   MEDICAL RECORD NO.:  [id number]                   PATIENT TYPE:  AMB   LOCATION:  DAY                                  FACILITY:  [facility]   PHYSICIAN:  Angelia Mould. Derrell Lolling, M.D.             DATE OF BIRTH:  09/11/1926   DATE OF PROCEDURE:  12/21/2003  DATE OF DISCHARGE:                                 OPERATIVE REPORT   PREOPERATIVE DIAGNOSIS:  Bilateral inguinal hernias.   POSTOPERATIVE DIAGNOSIS:  Bilateral inguinal hernias.   OPERATION PERFORMED:  Laparoscopic preperitoneal repair of bilateral  inguinal hernias with polypropylene mesh.   SURGEON:  Angelia Mould. Derrell Lolling, M.D.   OPERATIVE INDICATIONS:  This is a 77 year old white female, who was recently  diagnosed as having left inguinal hernia.  She had noted a bulge and some  burning sensation in the left groin.  On exam, she has a moderately enlarged  left inguinal hernia that is reducible and when standing, she has a smaller  right inguinal hernia.  She is brought to the operating room electively for  repair.   OPERATIVE TECHNIQUE:  Following the induction of general endotracheal  anesthesia, the patient's bladder was emptied with a Foley catheter.  The  abdomen and groin areas were prepped and draped in a sterile fashion.  Marcaine 0.5% was used as a local infiltration anesthetic.  A transverse  incision was made at the inferior rim of the umbilicus.  The fascia was  exposed and incised transversely, exposing the medial border of the left  rectus muscle.  The left rectus muscle was retracted laterally, and we were  able to bluntly dissect into the left rectus sheath.  A dissector balloon  was inserted into the left rectus sheath in the midline and down to the  symphysis pubis.  The video cam was inserted, and the dissector balloon was  inflated under direct vision.  The balloon deployed nicely bilaterally.  We  held the balloon in place for about 5 minutes  and then removed the balloon.  Insufflating trocar was inserted, and the __________ balloon was inflated,  and it was secured.  It was connected to the insufflator at 12 mmHg.  Preperitoneal space insufflated nicely, and we had good visualization.  We  had to put a 5 mm trocar in the midline just below the umbilicus and had to  use that to sweep the peritoneal edge away from the abdominal wall laterally  on both sides.  We then put 5 mm trocars, one in the right lower quadrant  and one in the left lower quadrant.   We identified the symphysis pubis and then cleaned off Cooper's ligaments on  both sides.  We cleaned the peritoneal edge away from the abdominal wall  muscles in the right lower quadrant and the left lower quadrant.  We then  identified the hernias on both sides.  She had a very  large indirect hernia  on the left side and a smaller indirect hernia on the right.  I really did  not see any evidence of direct hernia.  On the left side, we very carefully  dissected the hernia sac out of the inguinal canal.  This took a lot of  work, but ultimately we were able to dissect this without injuring the  peritoneum, and we were able to dissect the peritoneum all the way back up  above the level of the anterior-superior iliac spine.  The round ligament  was identified, isolated, secured with metal clips, and divided.  On the  right side, the dissection was similar, but the hernia sac was smaller.  We  were able to dissect the hernia sac on the right side out of the inguinal  canal and completely dissect it up superiorly above the level of the  anterior-superior iliac spine.  On the right side, we also secured the round  ligament with multiple metal clips and divided it.   On the left side, we had to sacrifice the inferior epigastric vessels  because they were draping into the operative field.  It was secured with  metal clips and divided.   At this point, we had both hernias reduced and  the anatomy dissected out.  We used a large size sheet of Bard 3D polypropylene mesh on each side.  We  placed the mesh on the left side first, making sure to overlap the midline a  little bit and overlap down on top of and below the Cooper's ligament.  The  mesh was deployed laterally as well.  We secured the mesh to the superior  rim of the symphysis pubis and the superior rim of Cooper's ligament.  We  then secured the mesh laterally, being very careful to palpate the tacker  through the abdominal wall to be sure that we stay above the ileopubic  tract.  The 5 mm tacker was then used to secure the superior rim of the mesh  across the posterior belly of the rectus muscle medially and the transversus  abdominis and internal oblique laterally.  On the right side, we inserted  another large sheet of 3D polypropylene mesh.  We overlapped the mesh in the  midline a little bit and secured it with the 5 mm tacker.  We secured this  mesh to the superior rim of Cooper's ligaments with the 5 mm tacker.  The  mesh on this side was also secured laterally with a similar palpation  technique and then the superior rim of this mesh was secured with the 5 mm  tacker.   We examined the repair, and we had excellent coverage of the mesh on both  sides.  There was no bleeding.  The pneumoperitoneum was released.  The  trocars were removed.  It should be noted that during the case, we got a  small hole in the peritoneum high on the right side, and we had to release  the pneumoperitoneum with a Veress needle in the right upper quadrant, and  that worked quite well.   Once all the pneumoperitoneum was released, we closed the fascia at the  umbilicus with two interrupted figure-of-eight sutures of 0 Vicryl.  The  skin incisions were closed with subcuticular sutures of 4-0 Vicryl and Steri-  Strips.  Clean bandages were placed and the patient taken to recovery room in stable condition.  The estimated blood loss  was about 20 mL.  Complications none.  Sponge, needle, and instrument counts were correct.                                               Angelia Mould. Derrell Lolling, M.D.    HMI/MEDQ  D:  12/21/2003  T:  12/21/2003  Job:  [id number]   cc:   Soyla Murphy. Renne Crigler, M.D.  [address]  [place]  [place] [address]  Fax: [phone]

## 2011-04-17 NOTE — Discharge Summary (Signed)
[place]  Patient:    [name], [name]                        MRN: [id number] Adm. Date:  [id number] Disc. Date: [id number] Attending:  Faith Rogue T Dictator:   Junie Bame, P.A. CC:         Lenard Galloway Chaney Malling, M.D.  Soyla Murphy. Renne Crigler, M.D.   Discharge Summary  DISCHARGE DIAGNOSES: 1. Status post right total knee replacement. 2. Anemia. 3. Hiatal hernia.  HISTORY OF PRESENT ILLNESS:  The patient is a 77 year old, white female admitted on July 16, with progressive bilateral knee pain, right greater than left.  The patient underwent right total knee replacement on July 16, by Dr. Chaney Malling due to end-stage DJD.  The patient was placed on Arixtra for DVT prophylaxis.  The patients postoperative complications include anemia requiring transfusion significant for left extremity edema.  PT report indicated that the patient is 50% postural weightbearing.  She is able to move three feet with standard walker with knee immobilizer.  PAST MEDICAL HISTORY: 1. Hiatal hernia. 2. DJD.  PAST SURGICAL HISTORY:  Tonsillectomy.  ALLERGIES:  PENICILLIN, ASPIRIN and SULFA.  PRIMARY CARE [place] [facility]:  Dr. Renne Crigler.  FAMILY HISTORY:  Noncontributory.  MEDICATIONS: 1. Zantac q.d. 2. Calcitrex 600 mg q.d. 3. Multivitamin.  SOCIAL HISTORY:  The patient lives with son in [place].  She was independent prior to admission.  She lives in a two-level home with four steps to entry.  She cannot stay on the first floor.  Her sister can aide and assist as needed.  Her son works mostly during the day.  HOSPITAL COURSE:  [name] was admitted to [facility] where she received PT and OT as tolerated.  Her hospital course was significant for anemia, constipation and elevated LFTs.  The patient remained on Trinsicon b.i.d. during the entire stay in rehabilitation for anemia.  She was on Arixtra for approximately seven days for DVT prophylaxis.  She was placed on Senokot S  two tablets q.h.s. for constipation.  At the time of admission, her LFTs were elevated with SGOT 46 and SGPT 65.  Repeat comprehensive metabolic panel was performed and SGOTs began to decrease.  There was no other major complications during her hospital stay while she was in rehabilitation.  Latest labs indicated that her potassium was 4.0, sodium 134, glucose 102, BUN 10, creatinine 0.6.  AST 36, ALT 46, Alk phos 117.  White count was 9.8, hemoglobin 9.8, hematocrit 28.2 and platelet count of 734.  At the time of discharge, her vital signs were stable.  Physical exam was unremarkable.  Her wound demonstrated no signs of infection.  Her staples were removed and Steri-Strips in place.  At the time of discharge, CPM indicated that she had 90 degrees of flexion, 0 extension.  PT report indicated that the patient was ambulating modified independently 100 feet with standard walker and could transfer sit to stand with modified independent with standard walker.  She would perform all ADLs modified independently.  She was discharged to home with her family.  DISCHARGE MEDICATIONS: 1. Ranitidine 300 mg in a.m. 2. Trinsicon one tablet twice daily. 3. Oxycodone 5-10 mg one to two tablets every four to six hours as needed for    pain. 4. Caltrate 600 mg with Vitamin D two tablets daily.  ACTIVITY:  She is to use her walker.  DIET:  No restrictions.  SPECIAL INSTRUCTIONS:  She is to  have [facility] for PT and OT.  FOLLOWUP:  Follow up with Dr. Chaney Malling within two weeks.  Follow up with Dr. Riley Kill as needed.  Follow up with Dr. Renne Crigler, her primary care physician, in four to six weeks. DD:  07/03/01 TD:  07/05/01 Job: [id number]

## 2011-04-17 NOTE — Op Note (Signed)
[place]. [facility]  Patient:    [name], [name]                        MRN: [id number] Proc. Date: 06/14/01 Adm. Date:  [id number] Attending:  Cornell Barman                           Operative Report  PREOPERATIVE DIAGNOSIS:  Severe osteoarthritis right knee.  POSTOPERATIVE DIAGNOSIS:  Severe osteoarthritis right knee.  OPERATION:  Total knee replacement on the right.  All components glued in.  A large right cemented femoral LCS component with a size 4 cement Kiel tibial tray with a 10 mm LCS poly insert and a rotating three peg cemented patellar insert.  SURGEON:  Lenard Galloway. Chaney Malling, M.D.  ASSISTANT:  Jamelle Rushing, P.A.  ANESTHESIA:  General  DESCRIPTION OF PROCEDURE:  The patient was placed on the operative table in the supine position.  The pneumatic tourniquet right upper thigh.  Right leg was prepped with Duraprep and draped out in the usual manner.  Vi-drape were placed over the operative site.  Right lower extremity was then wrapped out with an Esmarch and tourniquet was elevated.  A long incision was made starting above the patella and carried down to the tip of the tibial tubercle. Skin edges were retracted.  A long median parapatellar incision was then made. The patella was everted laterally.  There were huge osteophytes on both femoral condyles around the margin of the patella.  This was all debrided with a saw and rongeurs.  Once this was completed, the knee was flexed 90 degrees. Both the medial and lateral meniscus were excised.  A cutting block for the proximal tibia was applied with the external guide.  This put it at appropriate level and held the position. Fixation pins were then used to fix the cutting block. The external guide was removed and the alignment rod was inserted.  This showed excellent alignment of the tibial cutting tray. Retraction were placed on both sides of the knee and proximal end of the tibia was  amputated.  An excellent flush cut was achieved.  Attention was then turned to the distal femur.  The knee flexed 90 degrees the femoral cutting block #1 was placed over the anterior aspect of the femur. A drill hole was placed and intermedullary rod inserted.  Using the C clamp into the distal femoral cutting block, this was placed on the cut surface of the tibia.  The knee flexed 90 degrees.  This set up a rotation and a distal femoral block very nicely.  The block was then pinned into position. Using a capture guide the anterior and posterior cuts were made.  The knee flexed, a 10 mm spacer block was inserted and there was excellent balancing of both the medial and lateral ligaments.  Cutting block #2 was then placed on the anterior surface of the femur held in position with intramedullary rod.  This up to 4 degrees with obvious good fixation pins were applied.  The distal end of the femur was then amputated and the spacer block was inserted.  In full extension both the medial and lateral collateral ligaments were again perfectly balanced both in flexion and pin extension.  Femoral guide #3 was placed over the distal end of the femur locked in place with fixation pins and appropriate cuts were made. Attention was then turned to the  proximal tibia which was subluxed forward with a McKale retractor.  A size 4 tray seemed to fit properly.  This was held in position with fixation pins.  The tower was applied and the hole was made for the Kiel.  The Kiel wedge then passed down to slots in the plate and driven home.  All the components were removed.  Using pulse lavage all debride was removed.  The trial femoral component and trial tibial plate was put in place and 10 mm insert was applied.  The knee was put through a full range of motion.  There was slight hyperextension and full flexion, excellent stability.  No tendency of the poly to spurt out.  Attention was then turned to the posterior  aspect of the patella.  Patella clamps were then placed and the posterior aspect of the patella was amputated.  Three holes were placed over the posterior aspect of the patella using a patella drill guide.  The trial patella was inserted and the knee was put through a full range of motion.  A lateral release was done and there was excellent tracking of the patella.  All the components fit beautifully.  All the components were then removed.  A pulse lavage was used again. All debride was removed.  Glue was mixed.  Glue was placed over the proximal portion of the tibia first.  The #4 Kiel tibial tray was then inserted and this was impacted.  Excess glue was removed.  The poly was inserted over the proximal end of the tibial tray. Glue was placed over the distal end of the femur and a posterior runners of the femoral component.  The femoral component was then driven home and a nice tight flush fit was achieved.  Excess glue was removed.  The knee was put in full extension and excess glue was removed.  The patella prosthesis was then placed on the posterior aspect of the cut surface of the patella and glue was applied.  Patella clamp applied.  Excess glue was removed.  Once the glue had hardened, any excess glue was removed with a small osteotome.  Any excess glue was removed with a small osteotome and a small rongeur.  The knee was irrigated with copious amounts of antibiotic solution.  Excellent range of motion and full flexion and full extension and excellent balance of the ligaments was achieved.  The poly trial was then removed and tourniquet was deflated and all bleeders were coagulated.  The final poly was then inserted and the knee was put through a full range of motion once again.  Hemovac drain was inserted.  The long median parapatellar incision was closed with interrupted Ethibon sutures.  Vicryl was used to close the subcutaneous tissues and stainless steel staples were used to  close the skin.  Sterile dressings were applied and the patient returned to the recovery room in excellent condition.  The procedure went extremely well.  Drains were Hemovac. Complications were none. DD:  06/14/01 TD:  06/14/01  Job: [id number]

## 2011-04-17 NOTE — Procedures (Signed)
[facility]  Patient:    [name], [name]                        MRN: [id number] Adm. Date:  [id number] Disc. Date: [id number] Attending:  Sabino Gasser                           Procedure Report  PROCEDURE:  Endoscopy with Savary dilation.  INDICATIONS:  Dysphagia with previous dilation a few years ago.  ANESTHESIA:  Demerol 40 mg, Versed 5 mg intravenously in divided dose. Operative antibiotics were given.  DESCRIPTION OF PROCEDURE:  With the patient mildly sedated in the left lateral decubitus position in room #10 of radiology at [facility], the Olympus videoscopic endoscope was inserted in the mouth and passed easily under direct vision through the esophagus.  Distal esophagus was approached, and the stricture was seen and photographed.  We advanced through this into the stomach.  Hiatal hernia, fundus, body, antrum all were well visualized and appeared normal.  Photographs were taken as we entered into the duodenum.  The bulb and second portion also appeared normal, were photographed.  From this point, the endoscope was slowly withdrawn, taking circumferential views of the entire duodenal mucosa until the endoscope had been pulled back into the stomach, placed in retroflexion to view the stomach from below, and a hiatal hernia was seen and photographed.  The endoscope was then straightened and a guidewire was passed.  The endoscope was withdrawn.  Subsequently a 14, 17, and 19 Savary dilator were passed easily.  There was heme seen on the last dilator.  The guidewire was removed with the last.  The endoscope was reinserted.  There was mild amount of blood in the esophagus but otherwise no trauma seen.  The endoscope was withdrawn, taking circumferential views of the remaining esophageal mucosa, which otherwise appeared normal.  The patients vital signs and pulse oximetry remained stable.  The patient tolerated the procedure well without apparent  complications.  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  PLAN:  Clear liquids for today and advance diet as followed.  Follow patient as an outpatient. DD:  05/20/00 TD:  05/22/00 Job: [id number]

## 2011-04-17 NOTE — Discharge Summary (Signed)
[place]. [facility]  Patient:    [name], RILES Visit Number: [id number] MRN: [id number]          Service Type: [facility] Location: [id number] Attending Physician:  Herold Harms Dictated by:   Arnoldo Morale, P.A.C. Admit Date:  12/26/2001 Discharge Date: 01/03/2002   CC:         Soyla Murphy. Renne Crigler, M.D.   Discharge Summary  ADMISSION DIAGNOSES: 1. End-stage osteoarthritis left knee with slight varus deformity. 2. History of right knee replacement. 3. Degenerative disc disease in cervical and lumbar spine. 4. Hiatal hernia. 5. Gastroesophageal reflux disease. 6. Questionable urinary tract infection on admission.  DISCHARGE DIAGNOSES: 1. End-stage osteoarthritis left knee with varus deformity. 2. Left lower extremity deep venous thrombosis. 3. Acute blood loss anemia secondary to surgery. 4. Constipation. 5. Nausea secondary to Percocet. 6. Degenerative disc disease of cervical and lumbar spine. 7. Hiatal hernia. 8. Gastroesophageal reflux disease.  SURGICAL PROCEDURE:  On December 20, 2001, Ms. Kalama underwent a left total knee arthroplasty by Lenard Galloway. Chaney Malling, M.D., assisted by Arnoldo Morale, P.A.C.  COMPLICATIONS:  None.  CONSULTS: 1. Anesthesia consult for femoral catheter analgesia on December 21, 2001. 2. Rehab medicine consult on December 21, 2001. 3. Occupational therapy consult December 24, 2001.  HISTORY OF PRESENT ILLNESS:  This 77 year old white female patient presented to Dr. Chaney Malling with a history of a right knee replacement by him in July of 2002.  She did well with the right knee but was complaining of increasing left knee pain. At this point the pain has been getting progressively worse for the last four years.  It is an intermittent ache which becomes sharp at times located diffusely about the joint with some radiation proximally into the thigh and distally into the calf.  It increases with any prolonged standing and  decreases with rest. The knee catches, grinds and locks.  There is no swelling or giving way. She is not currently using any assistive devices but the pain has gotten severe enough that it has failed conservative treatment and that is why she is presenting for a left total knee replacement.  HOSPITAL COURSE:  The patient tolerated her surgical procedure well without immediate postoperative complications. She was subsequently transferred to 5000.  On postop day #1, she did complain of some nausea.  Her medications were adjusted. She did complain of some mild left calf pain but that had been present preoperatively. Leg was otherwise neurovascularly intact.  Hemoglobin 8.1, hematocrit 23.4.  UA showed possible UTI on admission so her antibiotics were continued and she was transfused two units of autologous blood.  She was continued on the femoral catheter for pain control.  On postop day #2, T-max was 99.4, vitals were stable.  Hemoglobin had improved to 9.4 with hematocrit of 27.3.  Potassium was low at 3.2.  Left knee wound was well-approximated with staples. She did have a positive Homans sign and calf tenderness and Doppler was obtained which showed a left lower extremity DVT. She was switched from Arixtra to treatment of Lovenox until her Coumadin would be therapeutic. She was started on Coumadin at that time.  On postop day #3, T-max 99.5, vital signs stable.  The left knee incision was unchanged. Still positive Homans sign on the left leg.  K-pad was continued to the left leg and she was continued on therapy.  She continued to do well over the next several days. Her calf pain seemed to decrease. Her labs remained  stable.  Temperature remained low.  On December 26, 2001, she did complain of some nausea which we attributed to the Percocet and she was switched to Vicodin.  Her Coumadin was getting closer to therapeutic range and it was advised to discontinue the Lovenox once that  was therapeutic. Bed became available in rehab at that time and she was transferred to rehab.  DISCHARGE INSTRUCTIONS: 1. Diet: She can continue her current hospitalization diet. 2. Medications:  She is to continue her current hospitalization medications    with adjustments to be made per the rehab physicians.  Lovenox can be    discontinued once Coumadin is therapeutic. 3. Activity:  She is to be out of bed, weightbearing as tolerated on the left    leg with the use of the walker. She is to continue PT and OT per rehab    protocols. 4. Wound care:  Knee incision needs to be cleaned with Betadine q.d. and a    dry dressing applied. K-pad to the calf.  Staples can be removed on    postop day #14 with Steri-Strips with benzoin applied. If she is still    in the rehab at that time, it can be done there; otherwise she needs to    follow up with Dr. Chaney Malling at that time. 5. Follow-up:  She needs to follow up with Dr. Chaney Malling either on postop day    #14 if she still has staples in place, or about two weeks after her    discharge from rehab if staples are removed while she is in rehab. 6. Please notify Dr. Chaney Malling of temperature greater than 101.5, chills, pain    unrelieved by pain medications, or foul smelling drainage from the wound.  LABORATORY DATA:  Bilateral lower extremity venous Doppler done on December 22, 2001, showed a DVT visualized in the popliteal vein on the left.  On December 15, 2001, hemoglobin 11.9, hematocrit 34.7, platelets 435.  On December 21, 2001, hemoglobin 8.1, hematocrit 23.4.  On December 22, 2001, white count 11.5, hemoglobin 9.4, hematocrit 27.3.  On December 23, 2001, hemoglobin 9.5, hematocrit 28.  On December 26, 2001, white count 8.8, hemoglobin 8.9, hematocrit 25.9 and platelets 382.  On December 15, 2001, PT 13.3, INR 1.  On December 26, 2001, PT 18.2, INR 1.7.  On December 21, 2001, glucose 143, calcium 8.3.  On December 22, 2001, sodium 134, potassium  3.2, glucose 125, BUN 5, creatinine 0.6.  On December 23, 2001, BUN 4.   On December 20, 2001, urine was hazy with large leukocyte esterase, few epithelials, 7 to 10 white cells, 0 to 2 red cells and few bacteria.  Repeat UA on December 26, 2001, showed trace hemoglobin, large leukocyte esterase, too numerous to count white cells, no red cells and few bacteria.  Urine culture on December 26, 2001, showed greater than 100,000 colonies/mL of E. coli which was sensitive to all antibiotics tested. Dictated by:   Arnoldo Morale, P.A.C. Attending Physician:  Herold Harms DD:  01/26/02 TD:  01/26/02 Job: [id number]

## 2011-04-17 NOTE — H&P (Signed)
[place]. [facility]  Patient:    [name], [name] Visit Number: [id number] MRN: [id number]          Service Type: Attending:  Lenard Galloway. Chaney Malling, M.D. Dictated by:   Arnoldo Morale, P.A. Adm. Date:  12/20/01                           History and Physical  DATE OF BIRTH:  1926/06/09.  CHIEF COMPLAINT:  Progressively worsening left knee pain for the last four years.  HISTORY OF PRESENT ILLNESS:  This 77 year old white female patient presents to Dr. Chaney Malling with a history of a right knee replacement by Dr. Chaney Malling on June 14, 2001.  She has been doing well with her right knee but has been complaining of increasing left knee pain.  At this point, the left knee has been bothering her for about four years, but it has been getting progressively worse.  She has had no previous surgery or injury to her left knee.  The pain is described as an intermittent ache which becomes sharp at times located diffusely about the joint with occasional radiation proximally into the thigh and distally into the calf.  The pain increases with any prolonged standing and decreases with rest.  The knee does catch, grind and lock at times.  There is not swelling or giving away.  She does complain of occasional pain at night.  She is currently taking two Tylenol Arthritis twice a day and that provides a moderate amount of relief.  She is not currently using any assistant devices but does use a cane just when needed.  ALLERGIES: 1. PENICILLIN causes a rash. 2. ASPIRIN causes edema and a rash. 3. SULFA causes a rash. 4. MORPHINE SULFATE causes severe nausea and vomiting.  CURRENT MEDICATIONS: 1. Protonix 40 mg one tablet p.o. q.d. 2. Caltrate Plus D 600 mg one tablet p.o. b.i.d. 3. Tylenol Arthritis two tablets p.o. b.i.d. 4. One-A-Day Multivitamin one tablet p.o. q.d. 5. Ferrous sulfate 325 mg one tablet p.o. b.i.d.  PAST MEDICAL HISTORY:  She was diagnosed with a  hiatal hernia about 15 years ago.  She has been treated for gastroesophageal reflux disease for four years. She does have osteoporosis and arthritis in multiple joints.  She denies any history of diabetes mellitus, hypertension, thyroid disease, peptic ulcer disease, heart disease, asthma, or any other chronic medical condition other than noted previously.  PAST SURGICAL HISTORY: 1. Tonsillectomy in 1938. 2. Right total knee arthroplasty on June 14, 2001 at [facility] by    Dr. Rinaldo Ratel.  SOCIAL HISTORY:  She denies any history of cigarette smoking, alcohol use or drug use.  She is divorced and has four grown children.  She currently lives in a two-story house with her son, but she will be staying on the first floor after her surgery.  She is a retired housewife.  Her medical doctor is Dr. Merri Brunette and his phone number is [phone].  FAMILY HISTORY:  Her mother passed away at age 74 with old age after fracturing her hip.  Her father died at the age of 67 with heart attack.  She has two brothers who passed away both from MI; one at age 14, one at age 70. She has two sisters who are alive and well; one age 36 and one age 6.  Her four children are all alive and healthy; one daughter age 75, a son age  50 and 15 and a daughter age 86.  REVIEW OF SYSTEMS:  She does complain of occasional cough due to her hiatal hernia.  She has occasional cervical spine pain and low lumbar pain due to degenerative disc disease.  She has a full upper plate of dentures and a lower partial plate.  She uses glasses for reading. She does have problems with reflux.  She has occasional constipation which she treats with over-the-counter medications.  She does have small bilateral cataracts.  She complains of hay fever in the spring.  She is complaining recently of some mild shortness of breath with one flight of stairs.  She does have carpal tunnel syndrome in both wrists and wears splints at  night but says the left one is much worse than the right.  All other systems are negative and noncontributory at this time.  PHYSICAL EXAMINATION:  GENERAL:  This is a well-developed, well-nourished, overweight, white female in no acute distress.  She has a slight limp on the left.  Mood and affect are appropriate.  Talks easily with the examiner.  Height 5 feet 2-1/2 inches, weight 155 pounds.  BMI is 27.  VITAL SIGNS: Temperature 97.6 degrees Fahrenheit, pulse 80, respirations 14, and blood pressure 146/74.  HEENT:  Normocephalic, atraumatic without frontal or maxillary sinus tenderness to palpation.  Conjunctiva pink, sclera non-icteric.  PERRLA.  EOMs intact.  No visible external ear deformities.  Hearing grossly intact.  She has a lot of dry, flaky skin in the auricle of both ears.  Tympanic membranes are pearly gray bilaterally with good light reflex.  Nose - The nasal septum midline.  Nasal mucosa pink and moist without exudates or polyps noted. Buccal mucosa pink and moist.  Dentures intact to the upper jaw line.  No obvious bleeding or bruising in the mouth.  Tongue and uvula midline.  Tongue without fasciculations and uvula rises equally with phonation.  NECK:  No visible masses or lesions noted.  Trachea midline.  No palpable lymphadenopathy nor thyromegaly.  Carotids +2 bilaterally without bruits. She has slightly decreased lateral bending to both sides and that does cause pain but otherwise full range of motion.  Mild pain with palpation in the mid thoracic and also the cervical vertebra.  CARDIOVASCULAR:  Heart rate and rhythm regular.  S1, S2 present without rubs, clicks or murmurs noted.  RESPIRATORY:  Respirations even and unlabored.  Breath sounds clear to auscultation bilaterally without rales or wheezes noted.  ABDOMEN:  Rounded abdominal contour.  Bowel sounds present x 4 quadrants. Soft, nontender to palpation without hepatosplenomegaly nor CVA tenderness  to palpation.  Femoral pulses are +2 bilaterally.  Again, some mild tenderness to  palpation in the mid thoracic spine and also cervical and low lumbar spine.  BREASTS/GU/RECTAL/PELVIC:  These exams deferred at this time.  MUSCULOSKELETAL EXAM:  No obvious deformities.  Bilateral upper extremities with full range of motion in these extremities without pain.  Radial pulses +2 bilaterally.  She has full range of motion of her hips, ankles and toes bilaterally.  She has mild +1 edema or her lower extremities, but this is nonpitting.  DP and PT pulses are +2 bilaterally.  Her right knee has a well healed midline incision.  There is no erythema or ecchymosis.  There is no effusion of the knee.  No crepitus with range of motion.  She has some mild tenderness to palpation just distal to the lateral joint line on the right. She has full extension and  flexion to 115 degrees.  She opens just slightly both to medial and lateral joint line with varus and valgus stress.  Left knee appears to be in a slight varus position.  The skin is intact and there is no other deformity noted of the knee.  She has a large amount of crepitus with range of motion of the left knee and has full extension but flexion to 110 degrees.  She has pain with palpation both on the medial and lateral joint line but the medial is much more tender than the lateral.  She also seems to open up slightly more on the medial joint line than laterally.  NEUROLOGIC:  Alert and oriented x 3.  Cranial nerves II through XII grossly intact.  Strength 5/5 bilateral upper and lower extremities.  Rapid alternating movements intact.  Sensation intact to light touch.  Deep tendon reflexes 2+ bilateral upper and lower extremities.  RADIOLOGIC FINDINGS:  X-rays taken of the left knee in December 2002 show severe end-stage osteoarthritis with huge osteophytes noted throughout the knee.  IMPRESSION: 1. End-stage osteoarthritis, left knee with a  slight varus deformity. Status    post right knee replacement. 2. Degenerative disc disease both cervical and lumbar spine. 3. Hiatal hernia. 4. Gastroesophageal reflux disease.  PLAN:  Ms. Knickerbocker will be admitted to [facility] on December 20, 2001 where she will undergo a left total knee arthroplasty by Dr. Rinaldo Ratel. She will undergo all the routine preop of laboratory tests and studies prior to this procedure.  If we have any medical problems while she is hospitalized, we will consult Dr. Renne Crigler. Dictated by:   Arnoldo Morale, P.A. Attending:  Lenard Galloway Chaney Malling, M.D. DD:  12/15/01 TD:  12/15/01 Job: [id number]

## 2011-04-17 NOTE — Op Note (Signed)
[name], [name]                           ACCOUNT NO.:  [id number]   MEDICAL RECORD NO.:  [id number]                   PATIENT TYPE:  AMB   LOCATION:  DSC                                  FACILITY:  [facility]   PHYSICIAN:  Katy Fitch. Naaman Plummer., M.D.          DATE OF BIRTH:  1926/11/07   DATE OF PROCEDURE:  08/01/2002  DATE OF DISCHARGE:                                 OPERATIVE REPORT   PREOPERATIVE DIAGNOSIS:  Chronic entrapped neuropathy median nerve, left  carpal tunnel.   POSTOPERATIVE DIAGNOSIS:  Chronic entrapped neuropathy median nerve, left  carpal tunnel.   PROCEDURE:  Release of left transverse carpal ligament.   SURGEON:  Katy Fitch. Sypher, M.D.   ASSISTANT:  Jonni Sanger, P.A.   ANESTHESIA:  IV regional at proximal forearm level supervised by the  anesthesiologist, Dr. Katrinka Blazing.   INDICATIONS:  The patient is a 77 year old woman who has a history of  bilateral carpal tunnel syndrome.   She has recently undergone right carpal tunnel release.   Clinical examination at this time revealed evidence of similar entrapped  neuropathy on the left.  Due to  a failure to respond to nonoperative  measures, she is brought to the operating at this time for release of her  left transverse carpal ligament and revealed evidence of similar entrapped  neuropathy on the left.  Due to a failure to respond to nonoperative  measures she is brought to the operating room at this time for release of  her left transverse carpal ligament.   DESCRIPTION OF PROCEDURE:  The patient was brought to the operating room and  placed in the supine position on the operating table.  Following placement  of an IV regional block, the left arm was prepped with Betadine soap and  solution and sterilely draped.  When anesthesia was satisfactory, the  procedure commenced with a short incision in the line of the ring finger and  the palm.  The subcutaneous tissues were carefully divided in the line of  the palmar fascia.  This was split longitudinally to the level of the common  sensory branch of the median nerve.  These were followed back to the  transverse carpal ligament being careful to isolate the median nerve.   Bleeding points along the margin of the carpal ligament were  electrocauterized with bipolar current followed by repair of the skin with  intradermal 0 Prolene suture.   Compressive dressing was applied with volar plaster splint and the hand in 5  degrees of dorsiflexion.    AFTER CARE:  She chooses to use Tylenol and/or ibuprofen.  She has been  given prescription analgesic medication in the past and has not required  medication.  Katy Fitch Naaman Plummer., M.D.    RVS/MEDQ  D:  08/01/2002  T:  08/01/2002  Job:  [id number]   cc:   Anesthesia Department
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
## 2011-04-17 NOTE — Op Note (Signed)
[name], CARA                         ACCOUNT NO.:  [id number]   MEDICAL RECORD NO.:  [id number]                   PATIENT TYPE:  AMB   LOCATION:  ENDO                                 FACILITY:  [facility]   PHYSICIAN:  Georgiana Spinner, M.D.                 DATE OF BIRTH:  August 18, 1926   DATE OF PROCEDURE:  06/09/2004  DATE OF DISCHARGE:                                 OPERATIVE REPORT   PROCEDURE:  Upper endoscopy.   INDICATIONS:  Dysphagia.   ANESTHESIA:  Demerol 50 mg, Versed 5 mg.   DESCRIPTION OF PROCEDURE:  With the patient mildly sedated in the left  lateral decubitus position, the Olympus videoscopic endoscope was inserted  in the mouth and passed under direct vision through the esophagus, which  appeared normal except for the most distal part of the esophagus which  appeared actually somewhat inflamed.  The patient is on a PPI.  The  fundus,  body, antrum, duodenal bulb, and second portion of the duodenum all appeared  normal.  From this point, the endoscope was slowly withdrawn taking  circumferential views of the duodenal mucosa until the endoscope was pulled  back into the stomach, placed in retroflexion and viewed the stomach from  below.  The endoscope was then straightened, withdrawn, taking  circumferential views of the remaining gastric and esophageal mucosa.  The  endoscope was then advanced distally.  Under fluoroscopic control a  guidewire was passed and subsequently Savary dilators, 14 and 17, were  passed rather easily without heme on any of the dilators at the latter.  The  guidewire was removed.  The endoscope was reinserted.  Small amount of blood  in the distal esophagus.  That is to be expected with this amount of  dilation.  The endoscope was withdrawn.  The patient's vital signs and pulse  oximetry remained stable.  The patient tolerated the procedure well without  apparent complication.   FINDINGS:  Inflammatory changes in the distal esophagus from  what appeared  to be reflux esophagitis, dilated to 14 and 17 Savary.   PLAN:  Await clinical response.  The patient will call me for outpatient  followup.                                               Georgiana Spinner, M.D.    GMO/MEDQ  D:  06/09/2004  T:  06/09/2004  Job:  [id number]

## 2011-04-17 NOTE — Op Note (Signed)
NAMEROONEY, GLADWIN NO.:  [id number]   MEDICAL RECORD NO.:  [id number]          PATIENT TYPE:  AMB   LOCATION:  ENDO                         FACILITY:  [facility]   PHYSICIAN:  Georgiana Spinner, M.D.    DATE OF BIRTH:  19-Jul-1926   DATE OF PROCEDURE:  01/04/2007  DATE OF DISCHARGE:                               OPERATIVE REPORT   SURGEON:  Georgiana Spinner, M.D.   PROCEDURE:  Colonoscopy.   INDICATIONS:  Colon cancer screening.   ANESTHESIA:  Fentanyl 75 mcg, Versed 6 mg.   DESCRIPTION OF PROCEDURE:  With the patient mildly sedated in the left  lateral decubitus position, the Pentax videoscopic colonoscope was  inserted into the rectum and passed under direct vision, and with  pressure applied, we reached the cecum, identified by the ileocecal  valve and appendiceal orifice, both of which were photographed.  From  this point, the colonoscope was slowly withdrawn, taking circumferential  views of the colonic mucosa,  stopping in the rectum, which appeared  normal on direct, and showed hemorrhoids on retroflex view.  The  endoscope was straightened and withdrawn.  The patient's vital signs and  pulse oximetry remained stable.  The patient tolerated the procedure  well and without apparent complications.   FINDINGS:  Internal hemorrhoids, otherwise, an unremarkable examination.   PLAN:  Consider repeat examination in 5 to 10 years.           ______________________________  Georgiana Spinner, M.D.     GMO/MEDQ  D:  01/04/2007  T:  01/04/2007  Job:  [id number]

## 2011-04-17 NOTE — Discharge Summary (Signed)
[place]  Patient:    [name], [name] Visit Number: [id number] MRN: [id number]          Service Type: [facility] Location: 4100 [id number] Attending Physician:  Herold Harms Dictated by:   Dian Situ, PA Admit Date:  12/26/2001 Discharge Date: 01/03/2002   CC:         Soyla Murphy. Renne Crigler, M.D.  Rodney A. Chaney Malling, M.D.   Discharge Summary  DISCHARGE DIAGNOSES: 1. Left total knee replacement. 2. Left popliteal deep venous thrombosis. 3. Thrombocytosis. 4. Escherichia coli urinary tract infection.  HISTORY OF PRESENT ILLNESS:  [name] is a 77 year old female with history of GERD, OA, status post right total knee replacement and now with left knee pain secondary to severe OA.  The patient elected to undergo left total knee replacement on January 21, by Dr. Chaney Malling.  Postoperatively, she is weightbearing as tolerated and was on Arixtra initially for DVT prophylaxis. She did develop some calf pain with Dopplers revealing left popliteal DVT. She was changed over to Coumadin with cross-cover for Lovenox with an INR therapeutic.  INR continues to remain at 1.7 today.  Nausea has been an issue and the patient is treated with Reglan, questioned to be the cause of this, and the patients medications have been adjusted.  Currently, she is supervision for transfers, close supervision for ambulating 40 feet with standard walker, knee flexion at 55 degrees.  She was transferred for progressive therapies.  PAST MEDICAL HISTORY: 1. GERD. 2. OA. 3. Osteoporosis. 4. Shortness of breath with exertion. 5. Occasional hemorrhoids.  ALLERGIES:  ASPIRIN, PENICILLIN, SULFA AND MORPHINE.  SOCIAL HISTORY:  The patient is retired and lives in two-level home with three steps at entry.  She has a son who lives with her, but works during the day. She does not use any tobacco or alcohol.  HOSPITAL COURSE:  [name] was admitted to  rehabilitation on December 26, 2001, for inpatient therapies to consist of PT/OT daily.  Past admission, she was maintained on weightbearing as tolerated status on left lower extremity.  She has been cross-covered with Lovenox as her INR has been slow to rise.  Labs done past admission showed hemoglobin 9.6, hematocrit 28.6, white count 11.0, platelets 197.  Sodium was 133, potassium 3.6, chloride 96, CO2 29, BUN 7, creatinine 0.6, glucose 131.  Mild elevation of LFTs noted with SGOT at 41, SGPT 46, Alk phos 172.  Urine culture done showed a greater than 100,000 colonies of E. coli and she was started on Cipro and has been treated with seven-day course of antibiotics.  Her INR did become therapeutic, however, on February 1, it was noted to drop to 1.6.  She was resumed on Lovenox with INR slowly rising to 1.7 on February 4.  A repeat CBC from February 4, showed some thrombocytosis with platelets at 1027.  Pathology reviewed with smear and overall findings were consistent with reactive thrombocytosis.  Her H&H were noted to be improved at 11.8 and 34.6.  The patient will be discharged on Lovenox 70 mg subcu q.12h. and Coumadin 10 mg a day.  She is to follow up at Coumadin clinic on Thursday.  Repeat INR to be done by the pharmacist.  Follow and adjust Coumadin to at least March 20, 2002, for treatment of her DVT.  The patient progressed well in her therapy.  Knee flexion was at 84 degrees.  She was modified independent for transfers, modified independent for ambulating 150 feet  with standard walker.  She is able to navigate stairs with assist. She was modified independent for ADLs including light kitchen tasks.  Further followup therapies to include home health PT by [facility].  On January 03, 2002, the patient was discharged to home.  DISCHARGE MEDICATIONS: 1. Caltrate D one b.i.d. 2. Ferrous sulfate 225 mg b.i.d. 3. Robaxin 500 mg q.i.d. 4. Coumadin 5 mg p.o. in the evening  today, and 1.5 mg on Wednesday p.m. 5. Protonix 40 mg per day. 6. Vicodin 1-2 mg p.o. q.4-6h. p.r.n. pain. 7. Lovenox 70 mg subcu b.i.d.  SPECIAL INSTRUCTIONS:  Go by Dr. Shellia Cleverly office Thursday at 8:30 a.m. to Coumadin clinic for protime draw.  At that time, further instructions regarding Lovenox and Coumadin to be given.  FOLLOWUP:  The patient is to follow up with Dr. Chaney Malling in two weeks. Followup with Dr. Renne Crigler for a routine check. Dictated by:   Dian Situ, PA Attending Physician:  Herold Harms DD:  02/22/02 TD:  02/23/02 Job: [id number]

## 2011-09-09 LAB — FUNGUS CULTURE W SMEAR: Fungal Smear: NONE SEEN

## 2011-09-09 LAB — POCT HEMOGLOBIN-HEMACUE
Hemoglobin: 12.9
Operator id: 123881

## 2011-09-09 LAB — AFB CULTURE WITH SMEAR (NOT AT ARMC): Acid Fast Smear: NONE SEEN

## 2011-12-10 DIAGNOSIS — H353290 Exudative age-related macular degeneration, unspecified eye, stage unspecified: Secondary | ICD-10-CM | POA: Diagnosis not present

## 2011-12-10 DIAGNOSIS — H31019 Macula scars of posterior pole (postinflammatory) (post-traumatic), unspecified eye: Secondary | ICD-10-CM | POA: Diagnosis not present

## 2011-12-10 DIAGNOSIS — H2510 Age-related nuclear cataract, unspecified eye: Secondary | ICD-10-CM | POA: Diagnosis not present

## 2012-01-21 DIAGNOSIS — H31019 Macula scars of posterior pole (postinflammatory) (post-traumatic), unspecified eye: Secondary | ICD-10-CM | POA: Diagnosis not present

## 2012-01-21 DIAGNOSIS — H2510 Age-related nuclear cataract, unspecified eye: Secondary | ICD-10-CM | POA: Diagnosis not present

## 2012-01-21 DIAGNOSIS — H353290 Exudative age-related macular degeneration, unspecified eye, stage unspecified: Secondary | ICD-10-CM | POA: Diagnosis not present

## 2012-01-21 DIAGNOSIS — Z961 Presence of intraocular lens: Secondary | ICD-10-CM | POA: Diagnosis not present

## 2012-01-27 DIAGNOSIS — R609 Edema, unspecified: Secondary | ICD-10-CM | POA: Diagnosis not present

## 2012-01-27 DIAGNOSIS — I10 Essential (primary) hypertension: Secondary | ICD-10-CM | POA: Diagnosis not present

## 2012-02-02 DIAGNOSIS — I10 Essential (primary) hypertension: Secondary | ICD-10-CM | POA: Diagnosis not present

## 2012-02-09 DIAGNOSIS — Z1231 Encounter for screening mammogram for malignant neoplasm of breast: Secondary | ICD-10-CM | POA: Diagnosis not present

## 2012-02-22 DIAGNOSIS — M19019 Primary osteoarthritis, unspecified shoulder: Secondary | ICD-10-CM | POA: Diagnosis not present

## 2012-02-22 DIAGNOSIS — M810 Age-related osteoporosis without current pathological fracture: Secondary | ICD-10-CM | POA: Diagnosis not present

## 2012-03-02 DIAGNOSIS — N952 Postmenopausal atrophic vaginitis: Secondary | ICD-10-CM | POA: Diagnosis not present

## 2012-03-02 DIAGNOSIS — N3020 Other chronic cystitis without hematuria: Secondary | ICD-10-CM | POA: Diagnosis not present

## 2012-03-02 DIAGNOSIS — R3915 Urgency of urination: Secondary | ICD-10-CM | POA: Diagnosis not present

## 2012-03-03 DIAGNOSIS — Z961 Presence of intraocular lens: Secondary | ICD-10-CM | POA: Diagnosis not present

## 2012-03-03 DIAGNOSIS — H31019 Macula scars of posterior pole (postinflammatory) (post-traumatic), unspecified eye: Secondary | ICD-10-CM | POA: Diagnosis not present

## 2012-03-03 DIAGNOSIS — H2510 Age-related nuclear cataract, unspecified eye: Secondary | ICD-10-CM | POA: Diagnosis not present

## 2012-03-03 DIAGNOSIS — H353290 Exudative age-related macular degeneration, unspecified eye, stage unspecified: Secondary | ICD-10-CM | POA: Diagnosis not present

## 2012-03-08 DIAGNOSIS — M810 Age-related osteoporosis without current pathological fracture: Secondary | ICD-10-CM | POA: Diagnosis not present

## 2012-04-05 DIAGNOSIS — E039 Hypothyroidism, unspecified: Secondary | ICD-10-CM | POA: Diagnosis not present

## 2012-04-05 DIAGNOSIS — I10 Essential (primary) hypertension: Secondary | ICD-10-CM | POA: Diagnosis not present

## 2012-04-11 DIAGNOSIS — M1990 Unspecified osteoarthritis, unspecified site: Secondary | ICD-10-CM | POA: Diagnosis not present

## 2012-04-11 DIAGNOSIS — Z0000 Encounter for general adult medical examination without abnormal findings: Secondary | ICD-10-CM | POA: Diagnosis not present

## 2012-04-11 DIAGNOSIS — M899 Disorder of bone, unspecified: Secondary | ICD-10-CM | POA: Diagnosis not present

## 2012-04-11 DIAGNOSIS — I10 Essential (primary) hypertension: Secondary | ICD-10-CM | POA: Diagnosis not present

## 2012-04-11 DIAGNOSIS — M949 Disorder of cartilage, unspecified: Secondary | ICD-10-CM | POA: Diagnosis not present

## 2012-04-11 DIAGNOSIS — M545 Low back pain: Secondary | ICD-10-CM | POA: Diagnosis not present

## 2012-04-14 DIAGNOSIS — Z961 Presence of intraocular lens: Secondary | ICD-10-CM | POA: Diagnosis not present

## 2012-04-14 DIAGNOSIS — H2510 Age-related nuclear cataract, unspecified eye: Secondary | ICD-10-CM | POA: Diagnosis not present

## 2012-04-14 DIAGNOSIS — H353290 Exudative age-related macular degeneration, unspecified eye, stage unspecified: Secondary | ICD-10-CM | POA: Diagnosis not present

## 2012-04-14 DIAGNOSIS — H31019 Macula scars of posterior pole (postinflammatory) (post-traumatic), unspecified eye: Secondary | ICD-10-CM | POA: Diagnosis not present

## 2012-05-18 DIAGNOSIS — J019 Acute sinusitis, unspecified: Secondary | ICD-10-CM | POA: Diagnosis not present

## 2012-05-26 DIAGNOSIS — H31019 Macula scars of posterior pole (postinflammatory) (post-traumatic), unspecified eye: Secondary | ICD-10-CM | POA: Diagnosis not present

## 2012-05-26 DIAGNOSIS — H2510 Age-related nuclear cataract, unspecified eye: Secondary | ICD-10-CM | POA: Diagnosis not present

## 2012-05-26 DIAGNOSIS — H353290 Exudative age-related macular degeneration, unspecified eye, stage unspecified: Secondary | ICD-10-CM | POA: Diagnosis not present

## 2012-05-26 DIAGNOSIS — Z961 Presence of intraocular lens: Secondary | ICD-10-CM | POA: Diagnosis not present

## 2012-06-06 DIAGNOSIS — R300 Dysuria: Secondary | ICD-10-CM | POA: Diagnosis not present

## 2012-06-06 DIAGNOSIS — B373 Candidiasis of vulva and vagina: Secondary | ICD-10-CM | POA: Diagnosis not present

## 2012-06-06 DIAGNOSIS — R609 Edema, unspecified: Secondary | ICD-10-CM | POA: Diagnosis not present

## 2012-06-09 ENCOUNTER — Inpatient Hospital Stay (HOSPITAL_COMMUNITY)
Admission: EM | Admit: 2012-06-09 | Discharge: 2012-06-13 | DRG: 481 | Disposition: A | Discharge status: Skilled Nursing Facility | Payer: Medicare Other | Attending: Internal Medicine | Admitting: Internal Medicine

## 2012-06-09 ENCOUNTER — Encounter (HOSPITAL_COMMUNITY): Payer: Self-pay | Admitting: Emergency Medicine

## 2012-06-09 ENCOUNTER — Emergency Department (HOSPITAL_COMMUNITY): Payer: Medicare Other

## 2012-06-09 ENCOUNTER — Inpatient Hospital Stay (HOSPITAL_COMMUNITY): Payer: Medicare Other | Admitting: Anesthesiology

## 2012-06-09 ENCOUNTER — Encounter (HOSPITAL_COMMUNITY): Payer: Self-pay | Admitting: Anesthesiology

## 2012-06-09 ENCOUNTER — Inpatient Hospital Stay (HOSPITAL_COMMUNITY): Payer: Medicare Other

## 2012-06-09 ENCOUNTER — Encounter (HOSPITAL_COMMUNITY)
Admission: EM | Disposition: A | Discharge status: Skilled Nursing Facility | Payer: Self-pay | Source: Home / Self Care | Attending: Internal Medicine

## 2012-06-09 DIAGNOSIS — Z882 Allergy status to sulfonamides status: Secondary | ICD-10-CM

## 2012-06-09 DIAGNOSIS — D72829 Elevated white blood cell count, unspecified: Secondary | ICD-10-CM

## 2012-06-09 DIAGNOSIS — R0789 Other chest pain: Secondary | ICD-10-CM

## 2012-06-09 DIAGNOSIS — Z87891 Personal history of nicotine dependence: Secondary | ICD-10-CM

## 2012-06-09 DIAGNOSIS — Q233 Congenital mitral insufficiency: Secondary | ICD-10-CM | POA: Diagnosis not present

## 2012-06-09 DIAGNOSIS — M159 Polyosteoarthritis, unspecified: Secondary | ICD-10-CM | POA: Diagnosis not present

## 2012-06-09 DIAGNOSIS — Z880 Allergy status to penicillin: Secondary | ICD-10-CM

## 2012-06-09 DIAGNOSIS — R52 Pain, unspecified: Secondary | ICD-10-CM | POA: Diagnosis not present

## 2012-06-09 DIAGNOSIS — R011 Cardiac murmur, unspecified: Secondary | ICD-10-CM

## 2012-06-09 DIAGNOSIS — Z79899 Other long term (current) drug therapy: Secondary | ICD-10-CM

## 2012-06-09 DIAGNOSIS — E871 Hypo-osmolality and hyponatremia: Secondary | ICD-10-CM

## 2012-06-09 DIAGNOSIS — S72143A Displaced intertrochanteric fracture of unspecified femur, initial encounter for closed fracture: Secondary | ICD-10-CM | POA: Diagnosis not present

## 2012-06-09 DIAGNOSIS — K5900 Constipation, unspecified: Secondary | ICD-10-CM | POA: Diagnosis present

## 2012-06-09 DIAGNOSIS — W010XXA Fall on same level from slipping, tripping and stumbling without subsequent striking against object, initial encounter: Secondary | ICD-10-CM | POA: Diagnosis present

## 2012-06-09 DIAGNOSIS — M25559 Pain in unspecified hip: Secondary | ICD-10-CM | POA: Diagnosis not present

## 2012-06-09 DIAGNOSIS — H3530 Unspecified macular degeneration: Secondary | ICD-10-CM | POA: Diagnosis present

## 2012-06-09 DIAGNOSIS — I059 Rheumatic mitral valve disease, unspecified: Secondary | ICD-10-CM

## 2012-06-09 DIAGNOSIS — Z886 Allergy status to analgesic agent status: Secondary | ICD-10-CM

## 2012-06-09 DIAGNOSIS — M67919 Unspecified disorder of synovium and tendon, unspecified shoulder: Secondary | ICD-10-CM | POA: Diagnosis not present

## 2012-06-09 DIAGNOSIS — M810 Age-related osteoporosis without current pathological fracture: Secondary | ICD-10-CM

## 2012-06-09 DIAGNOSIS — Z8249 Family history of ischemic heart disease and other diseases of the circulatory system: Secondary | ICD-10-CM

## 2012-06-09 DIAGNOSIS — M549 Dorsalgia, unspecified: Secondary | ICD-10-CM | POA: Diagnosis not present

## 2012-06-09 DIAGNOSIS — Z01818 Encounter for other preprocedural examination: Secondary | ICD-10-CM | POA: Diagnosis not present

## 2012-06-09 DIAGNOSIS — D62 Acute posthemorrhagic anemia: Secondary | ICD-10-CM | POA: Diagnosis not present

## 2012-06-09 DIAGNOSIS — S72009A Fracture of unspecified part of neck of unspecified femur, initial encounter for closed fracture: Secondary | ICD-10-CM

## 2012-06-09 DIAGNOSIS — I517 Cardiomegaly: Secondary | ICD-10-CM | POA: Diagnosis not present

## 2012-06-09 DIAGNOSIS — Z9181 History of falling: Secondary | ICD-10-CM | POA: Diagnosis not present

## 2012-06-09 DIAGNOSIS — Z888 Allergy status to other drugs, medicaments and biological substances status: Secondary | ICD-10-CM | POA: Diagnosis not present

## 2012-06-09 DIAGNOSIS — Z96659 Presence of unspecified artificial knee joint: Secondary | ICD-10-CM | POA: Diagnosis not present

## 2012-06-09 DIAGNOSIS — Y9229 Other specified public building as the place of occurrence of the external cause: Secondary | ICD-10-CM

## 2012-06-09 DIAGNOSIS — S73006A Unspecified dislocation of unspecified hip, initial encounter: Secondary | ICD-10-CM | POA: Diagnosis not present

## 2012-06-09 DIAGNOSIS — Z8719 Personal history of other diseases of the digestive system: Secondary | ICD-10-CM

## 2012-06-09 DIAGNOSIS — R0989 Other specified symptoms and signs involving the circulatory and respiratory systems: Secondary | ICD-10-CM | POA: Diagnosis not present

## 2012-06-09 DIAGNOSIS — I10 Essential (primary) hypertension: Secondary | ICD-10-CM | POA: Diagnosis present

## 2012-06-09 DIAGNOSIS — M1990 Unspecified osteoarthritis, unspecified site: Secondary | ICD-10-CM

## 2012-06-09 DIAGNOSIS — S329XXA Fracture of unspecified parts of lumbosacral spine and pelvis, initial encounter for closed fracture: Secondary | ICD-10-CM | POA: Diagnosis not present

## 2012-06-09 DIAGNOSIS — G8929 Other chronic pain: Secondary | ICD-10-CM | POA: Diagnosis present

## 2012-06-09 DIAGNOSIS — J984 Other disorders of lung: Secondary | ICD-10-CM | POA: Diagnosis not present

## 2012-06-09 DIAGNOSIS — Z5189 Encounter for other specified aftercare: Secondary | ICD-10-CM | POA: Diagnosis not present

## 2012-06-09 DIAGNOSIS — K219 Gastro-esophageal reflux disease without esophagitis: Secondary | ICD-10-CM

## 2012-06-09 DIAGNOSIS — E785 Hyperlipidemia, unspecified: Secondary | ICD-10-CM | POA: Diagnosis present

## 2012-06-09 DIAGNOSIS — R296 Repeated falls: Secondary | ICD-10-CM | POA: Diagnosis not present

## 2012-06-09 DIAGNOSIS — K449 Diaphragmatic hernia without obstruction or gangrene: Secondary | ICD-10-CM | POA: Diagnosis not present

## 2012-06-09 DIAGNOSIS — M25519 Pain in unspecified shoulder: Secondary | ICD-10-CM | POA: Diagnosis not present

## 2012-06-09 DIAGNOSIS — S72142A Displaced intertrochanteric fracture of left femur, initial encounter for closed fracture: Secondary | ICD-10-CM

## 2012-06-09 DIAGNOSIS — S72009D Fracture of unspecified part of neck of unspecified femur, subsequent encounter for closed fracture with routine healing: Secondary | ICD-10-CM | POA: Diagnosis not present

## 2012-06-09 HISTORY — DX: Essential (primary) hypertension: I10

## 2012-06-09 LAB — URINALYSIS, ROUTINE W REFLEX MICROSCOPIC
Bilirubin Urine: NEGATIVE
Glucose, UA: NEGATIVE mg/dL
Ketones, ur: NEGATIVE mg/dL
Nitrite: NEGATIVE
pH: 6.5 (ref 5.0–8.0)

## 2012-06-09 LAB — COMPREHENSIVE METABOLIC PANEL
ALT: 11 U/L (ref 0–35)
AST: 20 U/L (ref 0–37)
Alkaline Phosphatase: 69 U/L (ref 39–117)
CO2: 25 mEq/L (ref 19–32)
Chloride: 97 mEq/L (ref 96–112)
GFR calc non Af Amer: 82 mL/min — ABNORMAL LOW (ref >90)
Potassium: 4.7 mEq/L (ref 3.5–5.1)
Sodium: 133 mEq/L — ABNORMAL LOW (ref 135–145)
Total Bilirubin: 0.3 mg/dL (ref 0.3–1.2)

## 2012-06-09 LAB — ABO/RH: ABO/RH(D): O POS

## 2012-06-09 LAB — CBC
Platelets: 307 10*3/uL (ref 150–400)
RBC: 3.86 MIL/uL — ABNORMAL LOW (ref 3.87–5.11)
RDW: 12.3 % (ref 11.5–15.5)
WBC: 16.7 10*3/uL — ABNORMAL HIGH (ref 4.0–10.5)

## 2012-06-09 LAB — DIFFERENTIAL
Basophils Absolute: 0 10*3/uL (ref 0.0–0.1)
Lymphocytes Relative: 7 % — ABNORMAL LOW (ref 12–46)
Neutro Abs: 14.4 10*3/uL — ABNORMAL HIGH (ref 1.7–7.7)

## 2012-06-09 SURGERY — FIXATION, FRACTURE, INTERTROCHANTERIC, WITH INTRAMEDULLARY ROD
Anesthesia: General | Site: Hip | Laterality: Left | Wound class: Clean

## 2012-06-09 MED ORDER — MORPHINE SULFATE 2 MG/ML IJ SOLN: 0.5000 mg | INTRAMUSCULAR | Status: DC | PRN

## 2012-06-09 MED ORDER — SCOPOLAMINE 1 MG/3DAYS TD PT72
1.0000 | MEDICATED_PATCH | TRANSDERMAL | Status: DC
  Administered 2012-06-12: 1.5 mg via TRANSDERMAL
  Filled 2012-06-09: qty 1

## 2012-06-09 MED ORDER — CEFAZOLIN SODIUM-DEXTROSE 2-3 GM-% IV SOLR
2.0000 g | Freq: Four times a day (QID) | INTRAVENOUS | Status: AC
  Administered 2012-06-10 (×2): 2 g via INTRAVENOUS
  Filled 2012-06-09 (×2): qty 50

## 2012-06-09 MED ORDER — ENOXAPARIN SODIUM 40 MG/0.4ML ~~LOC~~ SOLN
40.0000 mg | SUBCUTANEOUS | Status: DC
  Administered 2012-06-10 – 2012-06-13 (×4): 40 mg via SUBCUTANEOUS
  Filled 2012-06-09 (×4): qty 0.4

## 2012-06-09 MED ORDER — CEFAZOLIN SODIUM 1-5 GM-% IV SOLN
INTRAVENOUS | Status: DC | PRN
  Administered 2012-06-09: 2 g via INTRAVENOUS

## 2012-06-09 MED ORDER — FENTANYL CITRATE 0.05 MG/ML IJ SOLN
50.0000 ug | Freq: Once | INTRAMUSCULAR | Status: AC
  Administered 2012-06-09: 50 ug via INTRAVENOUS
  Filled 2012-06-09: qty 2

## 2012-06-09 MED ORDER — 0.9 % SODIUM CHLORIDE (POUR BTL) OPTIME
TOPICAL | Status: DC | PRN
  Administered 2012-06-09: 1000 mL

## 2012-06-09 MED ORDER — HYDROMORPHONE HCL PF 1 MG/ML IJ SOLN: 1.0000 mg | Freq: Once | INTRAMUSCULAR | Status: DC

## 2012-06-09 MED ORDER — ONDANSETRON HCL 4 MG PO TABS: 4.0000 mg | ORAL_TABLET | Freq: Four times a day (QID) | ORAL | Status: DC | PRN

## 2012-06-09 MED ORDER — ACETAMINOPHEN 325 MG PO TABS: 650.0000 mg | ORAL_TABLET | Freq: Four times a day (QID) | ORAL | Status: DC | PRN

## 2012-06-09 MED ORDER — SODIUM CHLORIDE 0.9 % IJ SOLN
3.0000 mL | Freq: Two times a day (BID) | INTRAMUSCULAR | Status: DC
  Administered 2012-06-10 – 2012-06-13 (×6): 3 mL via INTRAVENOUS

## 2012-06-09 MED ORDER — SCOPOLAMINE 1 MG/3DAYS TD PT72
MEDICATED_PATCH | TRANSDERMAL | Status: DC | PRN
  Administered 2012-06-09: 1 via TRANSDERMAL

## 2012-06-09 MED ORDER — HYDROCODONE-ACETAMINOPHEN 5-325 MG PO TABS: 1.0000 | ORAL_TABLET | Freq: Four times a day (QID) | ORAL | Status: DC | PRN

## 2012-06-09 MED ORDER — HYDROMORPHONE HCL PF 1 MG/ML IJ SOLN: 0.2500 mg | INTRAMUSCULAR | Status: DC | PRN

## 2012-06-09 MED ORDER — FENTANYL CITRATE 0.05 MG/ML IJ SOLN
INTRAMUSCULAR | Status: DC | PRN
  Administered 2012-06-09: 100 ug via INTRAVENOUS
  Administered 2012-06-09 (×7): 50 ug via INTRAVENOUS

## 2012-06-09 MED ORDER — ONDANSETRON HCL 4 MG/2ML IJ SOLN
INTRAMUSCULAR | Status: DC | PRN
  Administered 2012-06-09: 4 mg via INTRAVENOUS

## 2012-06-09 MED ORDER — HYDROCODONE-ACETAMINOPHEN 5-325 MG PO TABS
1.0000 | ORAL_TABLET | ORAL | Status: DC | PRN
  Administered 2012-06-10: 2 via ORAL
  Filled 2012-06-09 (×2): qty 2

## 2012-06-09 MED ORDER — PHENOL 1.4 % MT LIQD: 1.0000 | OROMUCOSAL | Status: DC | PRN

## 2012-06-09 MED ORDER — HYDROMORPHONE HCL PF 1 MG/ML IJ SOLN
0.5000 mg | INTRAMUSCULAR | Status: DC | PRN
  Administered 2012-06-09 – 2012-06-13 (×12): 0.5 mg via INTRAVENOUS
  Filled 2012-06-09 (×13): qty 1

## 2012-06-09 MED ORDER — DOCUSATE SODIUM 100 MG PO CAPS
100.0000 mg | ORAL_CAPSULE | Freq: Two times a day (BID) | ORAL | Status: DC
  Administered 2012-06-09 – 2012-06-13 (×7): 100 mg via ORAL
  Filled 2012-06-09 (×9): qty 1

## 2012-06-09 MED ORDER — EPHEDRINE SULFATE 50 MG/ML IJ SOLN
INTRAMUSCULAR | Status: DC | PRN
  Administered 2012-06-09: 10 mg via INTRAVENOUS

## 2012-06-09 MED ORDER — PHENYLEPHRINE HCL 10 MG/ML IJ SOLN
INTRAMUSCULAR | Status: DC | PRN
  Administered 2012-06-09 (×9): 80 ug via INTRAVENOUS

## 2012-06-09 MED ORDER — LORAZEPAM 2 MG/ML IJ SOLN: 1.0000 mg | Freq: Once | INTRAMUSCULAR | Status: DC | PRN

## 2012-06-09 MED ORDER — MENTHOL 3 MG MT LOZG
1.0000 | LOZENGE | OROMUCOSAL | Status: DC | PRN
  Administered 2012-06-10: 3 mg via ORAL
  Filled 2012-06-09 (×2): qty 9

## 2012-06-09 MED ORDER — CALCIUM CARBONATE-VITAMIN D 500-200 MG-UNIT PO TABS
2.0000 | ORAL_TABLET | Freq: Two times a day (BID) | ORAL | Status: DC
  Administered 2012-06-10 – 2012-06-13 (×6): 2 via ORAL
  Filled 2012-06-09 (×9): qty 2

## 2012-06-09 MED ORDER — FERROUS SULFATE 325 (65 FE) MG PO TABS
325.0000 mg | ORAL_TABLET | Freq: Three times a day (TID) | ORAL | Status: DC
  Administered 2012-06-10 – 2012-06-13 (×11): 325 mg via ORAL
  Filled 2012-06-09 (×13): qty 1

## 2012-06-09 MED ORDER — ACETAMINOPHEN 650 MG RE SUPP: 650.0000 mg | Freq: Four times a day (QID) | RECTAL | Status: DC | PRN

## 2012-06-09 MED ORDER — METOCLOPRAMIDE HCL 5 MG/ML IJ SOLN
5.0000 mg | Freq: Three times a day (TID) | INTRAMUSCULAR | Status: DC | PRN
  Administered 2012-06-12 – 2012-06-13 (×3): 10 mg via INTRAVENOUS
  Filled 2012-06-09 (×3): qty 2

## 2012-06-09 MED ORDER — SODIUM CHLORIDE 0.9 % IV SOLN
INTRAVENOUS | Status: DC
  Administered 2012-06-10 – 2012-06-13 (×3): via INTRAVENOUS

## 2012-06-09 MED ORDER — SODIUM CHLORIDE 0.9 % IV SOLN: 250.0000 mL | INTRAVENOUS | Status: DC | PRN

## 2012-06-09 MED ORDER — LACTATED RINGERS IV SOLN
INTRAVENOUS | Status: DC | PRN
  Administered 2012-06-09 (×2): via INTRAVENOUS

## 2012-06-09 MED ORDER — PROPOFOL 10 MG/ML IV EMUL
INTRAVENOUS | Status: DC | PRN
  Administered 2012-06-09: 140 mg via INTRAVENOUS

## 2012-06-09 MED ORDER — HALOPERIDOL LACTATE 5 MG/ML IJ SOLN: 1.0000 mg | Freq: Four times a day (QID) | INTRAMUSCULAR | Status: DC | PRN

## 2012-06-09 MED ORDER — ONDANSETRON HCL 4 MG/2ML IJ SOLN: 4.0000 mg | Freq: Four times a day (QID) | INTRAMUSCULAR | Status: DC | PRN

## 2012-06-09 MED ORDER — ONDANSETRON HCL 4 MG/2ML IJ SOLN
4.0000 mg | Freq: Four times a day (QID) | INTRAMUSCULAR | Status: DC | PRN
  Administered 2012-06-11 – 2012-06-13 (×6): 4 mg via INTRAVENOUS
  Filled 2012-06-09 (×8): qty 2

## 2012-06-09 MED ORDER — LIDOCAINE HCL (CARDIAC) 20 MG/ML IV SOLN
INTRAVENOUS | Status: DC | PRN
  Administered 2012-06-09: 80 mg via INTRAVENOUS

## 2012-06-09 MED ORDER — SUCCINYLCHOLINE CHLORIDE 20 MG/ML IJ SOLN
INTRAMUSCULAR | Status: DC | PRN
  Administered 2012-06-09: 100 mg via INTRAVENOUS

## 2012-06-09 MED ORDER — POLYETHYLENE GLYCOL 3350 17 G PO PACK
17.0000 g | PACK | Freq: Every day | ORAL | Status: DC | PRN
  Administered 2012-06-12: 17 g via ORAL
  Filled 2012-06-09: qty 1

## 2012-06-09 MED ORDER — METHOCARBAMOL 500 MG PO TABS
500.0000 mg | ORAL_TABLET | Freq: Four times a day (QID) | ORAL | Status: DC | PRN
  Administered 2012-06-11 – 2012-06-12 (×4): 500 mg via ORAL
  Filled 2012-06-09 (×3): qty 1

## 2012-06-09 MED ORDER — METHOCARBAMOL 100 MG/ML IJ SOLN
500.0000 mg | Freq: Four times a day (QID) | INTRAVENOUS | Status: DC | PRN
  Administered 2012-06-13 (×2): 500 mg via INTRAVENOUS
  Filled 2012-06-09 (×4): qty 5

## 2012-06-09 MED ORDER — PANTOPRAZOLE SODIUM 40 MG PO TBEC
40.0000 mg | DELAYED_RELEASE_TABLET | Freq: Every day | ORAL | Status: DC
  Administered 2012-06-10 – 2012-06-13 (×4): 40 mg via ORAL
  Filled 2012-06-09 (×2): qty 1

## 2012-06-09 MED ORDER — SENNA 8.6 MG PO TABS
1.0000 | ORAL_TABLET | Freq: Two times a day (BID) | ORAL | Status: DC
  Administered 2012-06-09 – 2012-06-13 (×7): 8.6 mg via ORAL
  Filled 2012-06-09 (×9): qty 1

## 2012-06-09 MED ORDER — SCOPOLAMINE 1 MG/3DAYS TD PT72
1.0000 | MEDICATED_PATCH | TRANSDERMAL | Status: DC
  Administered 2012-06-09: 1.5 mg via TRANSDERMAL

## 2012-06-09 MED ORDER — SODIUM CHLORIDE 0.9 % IJ SOLN
3.0000 mL | INTRAMUSCULAR | Status: DC | PRN
  Administered 2012-06-11 (×2): 3 mL via INTRAVENOUS

## 2012-06-09 MED ORDER — LACTATED RINGERS IV SOLN
INTRAVENOUS | Status: DC
  Administered 2012-06-09: 18:00:00 via INTRAVENOUS

## 2012-06-09 MED ORDER — CALCIUM-MAGNESIUM-VITAMIN D ER 600-40-500 MG-MG-UNIT PO TB24: 2.0000 | ORAL_TABLET | Freq: Two times a day (BID) | ORAL | Status: DC

## 2012-06-09 MED ORDER — MAGNESIUM CHLORIDE 64 MG PO TBEC
1.0000 | DELAYED_RELEASE_TABLET | Freq: Two times a day (BID) | ORAL | Status: DC
  Administered 2012-06-09 – 2012-06-13 (×7): 64 mg via ORAL
  Filled 2012-06-09 (×9): qty 1

## 2012-06-09 MED ORDER — TRIMETHOPRIM 100 MG PO TABS
100.0000 mg | ORAL_TABLET | Freq: Every day | ORAL | Status: DC
  Administered 2012-06-10 – 2012-06-13 (×4): 100 mg via ORAL
  Filled 2012-06-09 (×5): qty 1

## 2012-06-09 MED ORDER — METOCLOPRAMIDE HCL 10 MG PO TABS: 5.0000 mg | ORAL_TABLET | Freq: Three times a day (TID) | ORAL | Status: DC | PRN

## 2012-06-09 SURGICAL SUPPLY — 44 items
BANDAGE CONFORM 3  STR LF (GAUZE/BANDAGES/DRESSINGS) IMPLANT
BIT DRILL 4.3MMS DISTAL GRDTED (BIT) ×2 IMPLANT
BLADE SURG 15 STRL LF DISP TIS (BLADE) ×1 IMPLANT
BLADE SURG 15 STRL SS (BLADE) ×1
CHLORAPREP W/TINT 26ML (MISCELLANEOUS) ×2 IMPLANT
CLOTH BEACON ORANGE TIMEOUT ST (SAFETY) ×2 IMPLANT
COVER MAYO STAND STRL (DRAPES) ×2 IMPLANT
DRAPE STERI IOBAN 125X83 (DRAPES) ×2 IMPLANT
DRILL 4.3MMS DISTAL GRADUATED (BIT) ×4
DRSG ADAPTIC 3X8 NADH LF (GAUZE/BANDAGES/DRESSINGS) IMPLANT
DRSG MEPILEX BORDER 4X4 (GAUZE/BANDAGES/DRESSINGS) ×2 IMPLANT
DRSG MEPILEX BORDER 4X8 (GAUZE/BANDAGES/DRESSINGS) IMPLANT
ELECT REM PT RETURN 9FT ADLT (ELECTROSURGICAL) ×2
ELECTRODE REM PT RTRN 9FT ADLT (ELECTROSURGICAL) ×1 IMPLANT
GLOVE BIO SURGEON STRL SZ8 (GLOVE) ×2 IMPLANT
GLOVE BIOGEL PI IND STRL 8 (GLOVE) ×1 IMPLANT
GLOVE BIOGEL PI INDICATOR 8 (GLOVE) ×1
GOWN STRL NON-REIN LRG LVL3 (GOWN DISPOSABLE) ×4 IMPLANT
GOWN STRL REIN XL XLG (GOWN DISPOSABLE) ×2 IMPLANT
GUIDEPIN 3.2X17.5 THRD DISP (PIN) ×2 IMPLANT
GUIDEWIRE BALL NOSE 100CM (WIRE) ×2 IMPLANT
HIP FRAC NAIL LEFT 11X360MM (Orthopedic Implant) ×2 IMPLANT
KIT BASIN OR (CUSTOM PROCEDURE TRAY) ×2 IMPLANT
KIT ROOM TURNOVER OR (KITS) ×2 IMPLANT
MANIFOLD NEPTUNE II (INSTRUMENTS) ×2 IMPLANT
NAIL HIP FRAC LEFT 11X360MM (Orthopedic Implant) ×1 IMPLANT
NS IRRIG 1000ML POUR BTL (IV SOLUTION) ×2 IMPLANT
PACK GENERAL/GYN (CUSTOM PROCEDURE TRAY) IMPLANT
PACK TOTAL JOINT (CUSTOM PROCEDURE TRAY) ×2 IMPLANT
PAD ARMBOARD 7.5X6 YLW CONV (MISCELLANEOUS) ×4 IMPLANT
SCREW ANTI ROTATION 85MM (Screw) ×2 IMPLANT
SCREW BONE CORTICAL 5.0X38 (Screw) ×2 IMPLANT
SCREW DRILL BIT ANIT ROTATION (BIT) ×2 IMPLANT
SCREW LAG 10.5MMX105MM HFN (Screw) ×2 IMPLANT
SPONGE LAP 18X18 X RAY DECT (DISPOSABLE) ×2 IMPLANT
STAPLER VISISTAT 35W (STAPLE) ×4 IMPLANT
STRIP CLOSURE SKIN 1/2X4 (GAUZE/BANDAGES/DRESSINGS) ×2 IMPLANT
SUT MNCRL AB 3-0 PS2 18 (SUTURE) ×2 IMPLANT
SUT VIC AB 0 CT1 27 (SUTURE) ×1
SUT VIC AB 0 CT1 27XBRD ANBCTR (SUTURE) ×1 IMPLANT
SUT VIC AB 2-0 CT1 27 (SUTURE) ×1
SUT VIC AB 2-0 CT1 TAPERPNT 27 (SUTURE) ×1 IMPLANT
TRAY FOLEY CATH 14FR (SET/KITS/TRAYS/PACK) IMPLANT
WATER STERILE IRR 1000ML POUR (IV SOLUTION) ×2 IMPLANT

## 2012-06-09 NOTE — H&P (Signed)
[name] is an 78 y.o. female.   Chief Complaint: left hip pain HPI: 78 y/o female with left hip pain after a fall from standing height earlier this afternoon.  She c/o dull aching pain that is mild and constant but gets worse and sharp with motion.  No h/o previous hip fx.  No h/o smoking or diabetes.  She last ate at 10:00.  She takes no blood thinners.  Normally walks with a walker but was using a cane and carrying something at the time of her fall.  She denies pain elsewhere.  Her PCP is Virgilio Belling, MD.  Past Medical History  Diagnosis Date  . Hyperlipidemia   . Mitral regurgitation     mild by echo 2003  . Allergic rhinitis   . Actinic keratosis   . Tenosynovitis     myxomatous  . Chronic constipation   . GERD (gastroesophageal reflux disease)   . Osteoarthritis   . Back pain   . Macular degeneration   . Hypertension     Past Surgical History  Procedure Date  . Synovectomy     of right long finger  . Hernial repain   . Inguinal hernia repair     laparoscopic preperitoneal repair of bilateral inguinal hernias  . Carpal tunnel release     bilateral  . Replacement total knee bilateral   . Tonisellectomy   . Tonisillectomy   . Tonsillectomy     Family History  Problem Relation Age of Onset  . Heart attack Father   . Heart attack Brother    Social History:  reports that she has never smoked. She does not have any smokeless tobacco history on file. She reports that she drinks about 1.2 ounces of alcohol per week. She reports that she does not use illicit drugs.  Allergies:  Allergies  Allergen Reactions  . Aspirin     Unknown  . Boniva (Ibandronic Acid)     Unknown  . Morphine     Unknown  . Nitrofuran Derivatives     Unknown  . Penicillins     Unknown  . Sulfonamide Derivatives     Unknown     (Not in a hospital admission)  Results for orders placed during the hospital encounter of 06/09/12 (from the past 48 hour(s))  CBC     Status: Abnormal   Collection Time   06/09/12  4:02 PM      Component Value Range Comment   WBC 16.7 (*) 4.0 - 10.5 K/uL    RBC 3.86 (*) 3.87 - 5.11 MIL/uL    Hemoglobin 11.5 (*) 12.0 - 15.0 g/dL    HCT 11.9 (*) 14.7 - 46.0 %    MCV 86.8  78.0 - 100.0 fL    MCH 29.8  26.0 - 34.0 pg    MCHC 34.3  30.0 - 36.0 g/dL    RDW 82.9  56.2 - 13.0 %    Platelets 307  150 - 400 K/uL   DIFFERENTIAL     Status: Abnormal   Collection Time   06/09/12  4:02 PM      Component Value Range Comment   Neutrophils Relative 87 (*) 43 - 77 %    Neutro Abs 14.4 (*) 1.7 - 7.7 K/uL    Lymphocytes Relative 7 (*) 12 - 46 %    Lymphs Abs 1.2  0.7 - 4.0 K/uL    Monocytes Relative 6  3 - 12 %    Monocytes Absolute 1.0  0.1 - 1.0 K/uL    Eosinophils Relative 0  0 - 5 %    Eosinophils Absolute 0.0  0.0 - 0.7 K/uL    Basophils Relative 0  0 - 1 %    Basophils Absolute 0.0  0.0 - 0.1 K/uL   COMPREHENSIVE METABOLIC PANEL     Status: Abnormal   Collection Time   06/09/12  4:02 PM      Component Value Range Comment   Sodium 133 (*) 135 - 145 mEq/L    Potassium 4.7  3.5 - 5.1 mEq/L    Chloride 97  96 - 112 mEq/L    CO2 25  19 - 32 mEq/L    Glucose, Bld 140 (*) 70 - 99 mg/dL    BUN 12  6 - 23 mg/dL    Creatinine, Ser 5.95  0.50 - 1.10 mg/dL    Calcium 9.1  8.4 - 63.8 mg/dL    Total Protein 6.8  6.0 - 8.3 g/dL    Albumin 3.9  3.5 - 5.2 g/dL    AST 20  0 - 37 U/L    ALT 11  0 - 35 U/L    Alkaline Phosphatase 69  39 - 117 U/L    Total Bilirubin 0.3  0.3 - 1.2 mg/dL    GFR calc non Af Amer 82 (*) >90 mL/min    GFR calc Af Amer >90  >90 mL/min   URINALYSIS, ROUTINE W REFLEX MICROSCOPIC     Status: Normal   Collection Time   06/09/12  4:12 PM      Component Value Range Comment   Color, Urine YELLOW  YELLOW    APPearance CLEAR  CLEAR    Specific Gravity, Urine 1.010  1.005 - 1.030    pH 6.5  5.0 - 8.0    Glucose, UA NEGATIVE  NEGATIVE mg/dL    Hgb urine dipstick NEGATIVE  NEGATIVE    Bilirubin Urine NEGATIVE  NEGATIVE    Ketones,  ur NEGATIVE  NEGATIVE mg/dL    Protein, ur NEGATIVE  NEGATIVE mg/dL    Urobilinogen, UA 0.2  0.0 - 1.0 mg/dL    Nitrite NEGATIVE  NEGATIVE    Leukocytes, UA NEGATIVE  NEGATIVE MICROSCOPIC NOT DONE ON URINES WITH NEGATIVE PROTEIN, BLOOD, LEUKOCYTES, NITRITE, OR GLUCOSE <1000 mg/dL.   Dg Chest 1 View  06/09/2012  *RADIOLOGY REPORT*  Clinical Data: Recent fall, hip fracture  CHEST - 1 VIEW  Comparison: Chest x-ray of 09/22/2007  Findings: No active infiltrate or effusion is seen. There is cardiomegaly present.  There is a large retrocardiac density present most consistent with a large hiatal hernia which has increased in size in the intervening years.  There is a thoracolumbar scoliosis present with the bones being diffusely osteopenic.  Degenerative change is noted in the right shoulder as well.  IMPRESSION:  1.  No active lung disease. 2.  Moderate to large hiatal hernia. 3.  Cardiomegaly.  Original Report Authenticated By: Juline Patch, M.D.   Dg Hip Complete Left  06/09/2012  *RADIOLOGY REPORT*  Clinical Data: Fall and left hip pain.  LEFT HIP - COMPLETE 2+ VIEW  Comparison: Acute abdominal series 02/25/2010  Findings: AP view of the pelvis and two views of the left hip demonstrate a displaced and mildly comminuted fracture of the left proximal femur.  There is an intertrochanteric component to the fracture but there may also be a fracture involving the femoral neck.  The lateral view is limited but  the left hip appears to be located.  Marked degenerative changes at the pubic symphysis. Evidence for scoliosis in the lower lumbar spine and diffuse osteopenia.  Pelvic bony ring appears to be intact.  IMPRESSION: Displaced and mildly comminuted fracture of the proximal left femur as described.  Original Report Authenticated By: Richarda Overlie, M.D.   Ct Head Wo Contrast  14-Jun-2012  *RADIOLOGY REPORT*  Clinical Data: Larey Seat.  Fracture right hip.  CT HEAD WITHOUT CONTRAST  Technique:  Contiguous axial images  were obtained from the base of the skull through the vertex without contrast.  Comparison: 02/25/2010.  Findings: No skull fracture or intracranial hemorrhage.  Global atrophy without hydrocephalus.  Small vessel disease type changes without CT evidence of large acute infarct.  Vascular calcifications.  No intracranial mass lesion detected on this unenhanced exam.  IMPRESSION: No skull fracture or intracranial hemorrhage.  Original Report Authenticated By: Fuller Canada, M.D.    ROS  No recent f/c/n/v/wt loss.  Blood pressure 153/70, pulse 83, temperature 97 F (36.1 C), temperature source Oral, resp. rate 17, SpO2 97.00%. Physical Exam wn wd elderly female in nad.  A and O x 4.  Mood and affect normal. EOMI.  Respirations unlabored.  L LE shortened and externally rotated.  2+ dp and pt pulses.  Feels LT dorsally and plantarly at foot.  No lymphadenopathy.    Assessment/Plan Left hip intertroch - to OR for IM nail.  The risks and benefits of the alternative treatment options have been discussed in detail.  The patient wishes to proceed with surgery and specifically understands risks of bleeding, infection, nerve damage, blood clots, need for additional surgery, amputation and death.   Toni Arthurs June 14, 2012, 5:11 PM

## 2012-06-09 NOTE — ED Provider Notes (Signed)
I saw and evaluated the patient, reviewed the resident's note and I agree with the findings and plan.  Derwood Kaplan, MD 06/09/12 1952

## 2012-06-09 NOTE — H&P (Signed)
[facility] History and Physical  VALOREE AGENT ZOX:[id number] DOB: [birth year] DOA: 06/09/2012   PCP: Londell Moh, MD   Chief Complaint: fall  HPI:  There is an 78 year old female with past medical history of hyperlipidemia call mitral regurgitation and chronic constipation. She was she was walking out of the store when she slept to the floor. She was she did not lose consciousness: There was no loss of control sphincters, no loss of consciousness. She relates no trauma to the head appeared no prodromal symptoms. After fall she started having pain in her left hip. Not able to move secondary to pain she noticed that her leg was externally rotated and shorter than the right.  Review of Systems:  Constitutional:  No weight loss, night sweats, Fevers, chills, fatigue.  HEENT:  No headaches, Difficulty swallowing,Tooth/dental problems,Sore throat,  No sneezing, itching, ear ache, nasal congestion, post nasal drip,  Cardio-vascular:  No chest pain, Orthopnea, PND, swelling in lower extremities, anasarca, dizziness, palpitations  GI:  No heartburn, indigestion, abdominal pain, nausea, vomiting, diarrhea, change in bowel habits, loss of appetite  Resp:  No shortness of breath with exertion or at rest. No excess mucus, no productive cough, No non-productive cough, No coughing up of blood.No change in color of mucus.No wheezing.No chest wall deformity  Skin:  no rash or lesions.  GU:  no dysuria, change in color of urine, no urgency or frequency. No flank pain.  Musculoskeletal:  No joint pain or swelling. No decreased range of motion. No back pain.  Psych:  No change in mood or affect. No depression or anxiety. No memory loss.    Past Medical History  Diagnosis Date  . Hyperlipidemia   . Mitral regurgitation     mild by echo 2003  . Allergic rhinitis   . Actinic keratosis   . Tenosynovitis     myxomatous  . Chronic constipation   . GERD (gastroesophageal reflux  disease)   . Osteoarthritis   . Back pain   . Macular degeneration   . Hypertension    Past Surgical History  Procedure Date  . Synovectomy     of right long finger  . Hernial repain   . Inguinal hernia repair     laparoscopic preperitoneal repair of bilateral inguinal hernias  . Carpal tunnel release     bilateral  . Replacement total knee bilateral   . Tonisellectomy   . Tonisillectomy   . Tonsillectomy    Social History:  reports that she has quit smoking. Her smoking use included Cigarettes. She has a 2 pack-year smoking history. She does not have any smokeless tobacco history on file. She reports that she drinks about 1.2 ounces of alcohol per week. She reports that she does not use illicit drugs.  Allergies  Allergen Reactions  . Aspirin     Unknown  . Boniva (Ibandronic Acid)     Unknown  . Morphine     Unknown  . Nitrofuran Derivatives     Unknown  . Penicillins     Unknown  . Sulfonamide Derivatives     Unknown    Family History  Problem Relation Age of Onset  . Heart attack Father   . Heart attack Brother     Prior to Admission medications   Medication Sig Start Date End Date Taking? Authorizing Provider  Calcium-Magnesium-Vitamin D (CITRACAL CALCIUM+D) 600-40-500 MG-MG-UNIT TB24 Take 2 capsules by mouth 2 (two) times daily.     Yes Historical Provider, MD  cetirizine (  ZYRTEC) 10 MG tablet Take 10 mg by mouth daily.     Yes Historical Provider, MD  Cholecalciferol (VITAMIN D3) 1000 UNITS tablet Take 1,000 Units by mouth daily.     Yes Historical Provider, MD  fish oil-omega-3 fatty acids 1000 MG capsule Take 1 g by mouth daily.     Yes Historical Provider, MD  HYDROcodone-acetaminophen (LORTAB) 7.5-500 MG per tablet 1 tablet. 1 tab four times daily    Yes Historical Provider, MD  Magnesium 250 MG TABS Take 1 tablet by mouth.    Yes Historical Provider, MD  Multiple Vitamin (MULTIVITAMIN) capsule Take 1 capsule by mouth daily.    Yes Historical Provider,  MD  Multiple Vitamins-Minerals (PRESERVISION AREDS 2 PO) Take 1 tablet by mouth 2 (two) times daily.    Yes Historical Provider, MD  Multiple Vitamins-Minerals (VISION-VITE PRESERVE PO) Take by mouth.     Yes Historical Provider, MD  olmesartan (BENICAR) 20 MG tablet Take 20 mg by mouth daily.     Yes Historical Provider, MD  omeprazole (PRILOSEC) 20 MG capsule Take 20 mg by mouth daily.     Yes Historical Provider, MD  Ranibizumab (LUCENTIS) 0.5 MG/0.05ML SOLN Inject into the eye. Every 6 weeks     Yes Historical Provider, MD  trimethoprim (TRIMPEX) 100 MG tablet Take 100 mg by mouth daily.   Yes Historical Provider, MD  vitamin B-12 (CYANOCOBALAMIN) 500 MCG tablet Take 500 mcg by mouth daily.     Yes Historical Provider, MD  Wheat Dextrin (BENEFIBER DRINK MIX PO) Take by mouth. 2 tsp two times a day    Yes Historical Provider, MD   Physical Exam: Filed Vitals:   06/09/12 1423 06/09/12 1634 06/09/12 1700  BP: 181/101 153/70 141/61  Pulse: 75 83 89  Temp: 97 F (36.1 C)    TempSrc: Oral    Resp: 20 17 16   SpO2: 98% 97% 99%   BP 141/61  Pulse 89  Temp 97 F (36.1 C) (Oral)  Resp 16  SpO2 99%  General Appearance:    Alert, cooperative, no distress, appears stated age  Head:    Normocephalic, without obvious abnormality, atraumatic  Eyes:    PERRL, conjunctiva/corneas clear, EOM's intact, fundi    benign, both eyes  Ears:    Normal TM's and external ear canals, both ears  Nose:   Nares normal, septum midline, mucosa normal, no drainage    or sinus tenderness  Throat:   Lips, mucosa, and tongue normal; teeth and gums normal  Neck:   Supple, symmetrical, trachea midline, no adenopathy;    thyroid:  no enlargement/tenderness/nodules; no carotid   bruit or JVD  Back:     Symmetric, no curvature, ROM normal, no CVA tenderness  Lungs:     Clear to auscultation bilaterally, respirations unlabored  Chest Wall:    No tenderness or deformity   Heart:    Regular rate and rhythm, S1 and S2  normal, no murmur, rub   or gallop     Abdomen:     Soft, non-tender, bowel sounds active all four quadrants,    no masses, no organomegaly        Extremities:   left leg is externally rotated and shorter than the right.   Pulses:   2+ and symmetric all extremities  Skin:   Skin color, texture, turgor normal, no rashes or lesions  Lymph nodes:   Cervical, supraclavicular, and axillary nodes normal  Neurologic:   CNII-XII intact, normal strength,  sensation and reflexes    throughout    Labs on Admission:  Basic Metabolic Panel:  Lab 06/09/12 1610  NA 133*  K 4.7  CL 97  CO2 25  GLUCOSE 140*  BUN 12  CREATININE 0.58  CALCIUM 9.1  MG --  PHOS --   Liver Function Tests:  Lab 06/09/12 1602  AST 20  ALT 11  ALKPHOS 69  BILITOT 0.3  PROT 6.8  ALBUMIN 3.9   No results found for this basename: LIPASE:5,AMYLASE:5 in the last 168 hours No results found for this basename: AMMONIA:5 in the last 168 hours CBC:  Lab 06/09/12 1602  WBC 16.7*  NEUTROABS 14.4*  HGB 11.5*  HCT 33.5*  MCV 86.8  PLT 307   Cardiac Enzymes: No results found for this basename: CKTOTAL:5,CKMB:5,CKMBINDEX:5,TROPONINI:5 in the last 168 hours BNP: No components found with this basename: POCBNP:5 CBG: No results found for this basename: GLUCAP:5 in the last 168 hours  Radiological Exams on Admission: Dg Chest 1 View  06/09/2012  *RADIOLOGY REPORT*  Clinical Data: Recent fall, hip fracture  CHEST - 1 VIEW  Comparison: Chest x-ray of 09/22/2007  Findings: No active infiltrate or effusion is seen. There is cardiomegaly present.  There is a large retrocardiac density present most consistent with a large hiatal hernia which has increased in size in the intervening years.  There is a thoracolumbar scoliosis present with the bones being diffusely osteopenic.  Degenerative change is noted in the right shoulder as well.  IMPRESSION:  1.  No active lung disease. 2.  Moderate to large hiatal hernia. 3.   Cardiomegaly.  Original Report Authenticated By: Juline Patch, M.D.   Dg Hip Complete Left  06/09/2012  *RADIOLOGY REPORT*  Clinical Data: Fall and left hip pain.  LEFT HIP - COMPLETE 2+ VIEW  Comparison: Acute abdominal series 02/25/2010  Findings: AP view of the pelvis and two views of the left hip demonstrate a displaced and mildly comminuted fracture of the left proximal femur.  There is an intertrochanteric component to the fracture but there may also be a fracture involving the femoral neck.  The lateral view is limited but the left hip appears to be located.  Marked degenerative changes at the pubic symphysis. Evidence for scoliosis in the lower lumbar spine and diffuse osteopenia.  Pelvic bony ring appears to be intact.  IMPRESSION: Displaced and mildly comminuted fracture of the proximal left femur as described.  Original Report Authenticated By: Richarda Overlie, M.D.   Ct Head Wo Contrast  06/09/2012  *RADIOLOGY REPORT*  Clinical Data: Larey Seat.  Fracture right hip.  CT HEAD WITHOUT CONTRAST  Technique:  Contiguous axial images were obtained from the base of the skull through the vertex without contrast.  Comparison: 02/25/2010.  Findings: No skull fracture or intracranial hemorrhage.  Global atrophy without hydrocephalus.  Small vessel disease type changes without CT evidence of large acute infarct.  Vascular calcifications.  No intracranial mass lesion detected on this unenhanced exam.  IMPRESSION: No skull fracture or intracranial hemorrhage.  Original Report Authenticated By: Fuller Canada, M.D.    EKG: Independently reviewed. Normal sinus rhythm with axis deviation with T wave inversions  Assessment/Plan: Principal Problem:  *Hip fx: I agree with the orthopedic to proceed with surgery. Further management per orthopedic. Anticoagulation, pain management per orthopedic service.  Preop evaluation: Patient is an 78 year old female no anginal symptoms, no past medical history of coronary artery  disease. No arrhythmias or past medical history of arrhythmias. She  can walk more than a block without any difficulties or shortness of breath. She does have a past medical history of tobacco abuse. She is not on home oxygen. Her renal function and liver functions are within normal limits. She is in low to moderate risk for any cardiopulmonary complications. I've discussed with the family the risk and benefits. The family understands and would like to proceed with surgery. I think the benefits of surgery outweighed the risk of any complications.  Also start her on MiraLax here in the hospital. She's had problems with her constipation. She is on narcotics and we'll proceed with surgery. She might get additional records which might make her constipation worse.  -She is also an 78 year old female stool have to monitor for delirium. Each eye Zobel when necessary if needed at a low dose.  Leukocytosis: -This most likely stress de margination she relates no dysuria, shortness or breath, or cough. They'll continue to monitor her fever curve. Will not start antibiotics at this time.  BACK PAIN, CHRONIC She is whole or in part from 4 times a day for chronic back pain.   Time spend: Greater than 45 minutes Code Status: Full code Family Communication: Patient and family Disposition Plan: Inpatient  Marinda Elk, MD  [facility] Pager [phone]  If 7PM-7AM, please contact night-coverage [email or web address] Password [facility] 06/09/2012, 5:53 PM

## 2012-06-09 NOTE — ED Notes (Signed)
Pt was walking at the drug store with her cane. She was carrying a package and stumbled, falling to the floor. Pt states that she did not hit her head and denies LOC.Pt states that she landed on her left hip. Pt complains of left hip pain.

## 2012-06-09 NOTE — ED Notes (Signed)
Pt received 150 mcg fentanyl PTA for pain by EMS

## 2012-06-09 NOTE — ED Notes (Signed)
Ortho MD, Dr. Hewitt at bedside. 

## 2012-06-09 NOTE — ED Notes (Signed)
Hospitalist MD at bedside. 

## 2012-06-09 NOTE — Progress Notes (Signed)
[name], is a 77 y.o. female,   MRN: [id number]  -  DOB - 04-Jan-1926  Outpatient Primary MD for the patient is Londell Moh, MD  in for    Chief Complaint  Patient presents with  . Fall     Blood pressure 153/70, pulse 83, temperature 97 F (36.1 C), temperature source Oral, resp. rate 17, SpO2 97.00%.  Principal Problem:  *Hip fx Active Problems:  GERD  BACK PAIN, CHRONIC   78 yo female fall from home after tripping while carrying packages and not using her walker. Xray yields fx femur. Hx HTN, mild mitral regurgitation and gerd. PCP Dr. Renne Crigler. Ortho consulting and plan to take to OR 6pm.  VSS, pain controlled.

## 2012-06-09 NOTE — ED Notes (Signed)
Placed a foley cath in patient clear yellow urine in return a 18 french cath was placed

## 2012-06-09 NOTE — Anesthesia Preprocedure Evaluation (Addendum)
Anesthesia Evaluation  Patient identified by MRN, date of birth, ID band Patient awake    Reviewed: Allergy & Precautions, H&P , NPO status , Patient's Chart, lab work & pertinent test results  History of Anesthesia Complications (+) PONV  Airway Mallampati: I TM Distance: >3 FB Neck ROM: Full    Dental  (+) Upper Dentures   Pulmonary    Pulmonary exam normal       Cardiovascular hypertension, + Valvular Problems/Murmurs  Mitral calcification mild MR stable   Ef NL    Neuro/Psych  Neuromuscular disease    GI/Hepatic GERD-  ,  Endo/Other    Renal/GU      Musculoskeletal   Abdominal (+) + obese,   Peds  Hematology   Anesthesia Other Findings   Reproductive/Obstetrics                         Anesthesia Physical Anesthesia Plan  ASA: III  Anesthesia Plan: General   Post-op Pain Management:    Induction: Intravenous  Airway Management Planned: Oral ETT  Additional Equipment:   Intra-op Plan:   Post-operative Plan: Extubation in OR  Informed Consent: I have reviewed the patients History and Physical, chart, labs and discussed the procedure including the risks, benefits and alternatives for the proposed anesthesia with the patient or authorized representative who has indicated his/her understanding and acceptance.     Plan Discussed with: CRNA, Surgeon and Anesthesiologist  Anesthesia Plan Comments:        Anesthesia Quick Evaluation

## 2012-06-09 NOTE — ED Notes (Addendum)
Pt fell from standing position after loosing her balance no loc. Pt is know c/o left hip pain. Pt has rotation of that leg. EMS gave total fentanyl 150 mcg IV enroute

## 2012-06-09 NOTE — Brief Op Note (Signed)
06/09/2012  9:07 PM  PATIENT:  [name]  78 y.o. female  PRE-OPERATIVE DIAGNOSIS: left hip intertrochanteric fracture  POST-OPERATIVE DIAGNOSIS: same  Procedure(s): 1.  IM nailing of left hip intertroch fracture 2.  Fluoro > 1 hour  SURGEON:  Toni Arthurs, MD  ASSISTANT: n/a  ANESTHESIA:   General  EBL:  300 cc  TOURNIQUET:  n/a  COMPLICATIONS:  None apparent  DISPOSITION:  Extubated, awake and stable to recovery.  DICTATION ID:   [id number]

## 2012-06-09 NOTE — ED Provider Notes (Signed)
History     CSN: [id number]  Arrival date & time 06/09/12  1412   First MD Initiated Contact with Patient 06/09/12 1422      Chief Complaint  Patient presents with  . Fall    (Consider location/radiation/quality/duration/timing/severity/associated sxs/prior treatment) HPI  Past Medical History  Diagnosis Date  . Hyperlipidemia   . Mitral regurgitation     mild by echo 2003  . Allergic rhinitis   . Actinic keratosis   . Tenosynovitis     myxomatous  . Chronic constipation   . GERD (gastroesophageal reflux disease)   . Osteoarthritis   . Back pain   . Macular degeneration   . Hypertension     Past Surgical History  Procedure Date  . Synovectomy     of right long finger  . Hernial repain   . Inguinal hernia repair     laparoscopic preperitoneal repair of bilateral inguinal hernias  . Carpal tunnel release     bilateral  . Replacement total knee bilateral   . Tonisellectomy   . Tonisillectomy   . Tonsillectomy     Family History  Problem Relation Age of Onset  . Heart attack Father   . Heart attack Brother     History  Substance Use Topics  . Smoking status: Never Smoker   . Smokeless tobacco: Not on file  . Alcohol Use: 1.2 oz/week    2 Glasses of wine per week     2 glasses per week    OB History    Grav Para Term Preterm Abortions TAB SAB Ect Mult Living                  Review of Systems  Allergies  Aspirin; Boniva; Morphine; Nitrofuran derivatives; Penicillins; and Sulfonamide derivatives  Home Medications   Current Outpatient Rx  Name Route Sig Dispense Refill  . CALCIUM-MAGNESIUM-VITAMIN D ER 600-40-500 MG-MG-UNIT PO TB24 Oral Take 2 capsules by mouth 2 (two) times daily.      Marland Kitchen CETIRIZINE HCL 10 MG PO TABS Oral Take 10 mg by mouth daily.      Marland Kitchen VITAMIN D3 1000 UNITS PO TABS Oral Take 1,000 Units by mouth daily.      . OMEGA-3 FATTY ACIDS 1000 MG PO CAPS Oral Take 1 g by mouth daily.      Marland Kitchen HYDROCODONE-ACETAMINOPHEN 7.5-500 MG PO  TABS  1 tablet. 1 tab four times daily     . MAGNESIUM 250 MG PO TABS Oral Take 1 tablet by mouth.     . MULTIVITAMINS PO CAPS Oral Take 1 capsule by mouth daily.     Marland Kitchen PRESERVISION AREDS 2 PO Oral Take 1 tablet by mouth 2 (two) times daily.     Marland Kitchen VISION-VITE PRESERVE PO Oral Take by mouth.      . OLMESARTAN MEDOXOMIL 20 MG PO TABS Oral Take 20 mg by mouth daily.      Marland Kitchen OMEPRAZOLE 20 MG PO CPDR Oral Take 20 mg by mouth daily.      Marland Kitchen RANIBIZUMAB 0.5 MG/0.05ML IO SOLN Intraocular Inject into the eye. Every 6 weeks      . TRIMETHOPRIM 100 MG PO TABS Oral Take 100 mg by mouth daily.    Marland Kitchen VITAMIN B-12 500 MCG PO TABS Oral Take 500 mcg by mouth daily.      . BENEFIBER DRINK MIX PO Oral Take by mouth. 2 tsp two times a day       BP 153/70  Pulse  83  Temp 97 F (36.1 C) (Oral)  Resp 17  SpO2 97%  Physical Exam  ED Course  Procedures (including critical care time)  Labs Reviewed  CBC - Abnormal; Notable for the following:    WBC 16.7 (*)     RBC 3.86 (*)     Hemoglobin 11.5 (*)     HCT 33.5 (*)     All other components within normal limits  DIFFERENTIAL - Abnormal; Notable for the following:    Neutrophils Relative 87 (*)     Neutro Abs 14.4 (*)     Lymphocytes Relative 7 (*)     All other components within normal limits  URINALYSIS, ROUTINE W REFLEX MICROSCOPIC  COMPREHENSIVE METABOLIC PANEL  TYPE AND SCREEN   Dg Chest 1 View  06/09/2012  *RADIOLOGY REPORT*  Clinical Data: Recent fall, hip fracture  CHEST - 1 VIEW  Comparison: Chest x-ray of 09/22/2007  Findings: No active infiltrate or effusion is seen. There is cardiomegaly present.  There is a large retrocardiac density present most consistent with a large hiatal hernia which has increased in size in the intervening years.  There is a thoracolumbar scoliosis present with the bones being diffusely osteopenic.  Degenerative change is noted in the right shoulder as well.  IMPRESSION:  1.  No active lung disease. 2.  Moderate to  large hiatal hernia. 3.  Cardiomegaly.  Original Report Authenticated By: Juline Patch, M.D.   Dg Hip Complete Left  06/09/2012  *RADIOLOGY REPORT*  Clinical Data: Fall and left hip pain.  LEFT HIP - COMPLETE 2+ VIEW  Comparison: Acute abdominal series 02/25/2010  Findings: AP view of the pelvis and two views of the left hip demonstrate a displaced and mildly comminuted fracture of the left proximal femur.  There is an intertrochanteric component to the fracture but there may also be a fracture involving the femoral neck.  The lateral view is limited but the left hip appears to be located.  Marked degenerative changes at the pubic symphysis. Evidence for scoliosis in the lower lumbar spine and diffuse osteopenia.  Pelvic bony ring appears to be intact.  IMPRESSION: Displaced and mildly comminuted fracture of the proximal left femur as described.  Original Report Authenticated By: Richarda Overlie, M.D.   Ct Head Wo Contrast  06/09/2012  *RADIOLOGY REPORT*  Clinical Data: Larey Seat.  Fracture right hip.  CT HEAD WITHOUT CONTRAST  Technique:  Contiguous axial images were obtained from the base of the skull through the vertex without contrast.  Comparison: 02/25/2010.  Findings: No skull fracture or intracranial hemorrhage.  Global atrophy without hydrocephalus.  Small vessel disease type changes without CT evidence of large acute infarct.  Vascular calcifications.  No intracranial mass lesion detected on this unenhanced exam.  IMPRESSION: No skull fracture or intracranial hemorrhage.  Original Report Authenticated By: Fuller Canada, M.D.     1. Intertrochanteric fracture of left hip       MDM  Pt comes in post fall. Pt has left lower extremity deformity and her hip is externally rotated. X-ray shows comminuted fracture of the left femur. Ortho consulted. Neuro-vascularly intact.  Derwood Kaplan, MD 06/09/12 1909

## 2012-06-09 NOTE — Transfer of Care (Signed)
Immediate Anesthesia Transfer of Care Note  Patient: [name]  Procedure(s) Performed: Procedure(s) (LRB): INTRAMEDULLARY (IM) NAIL INTERTROCHANTERIC (Left)  Patient Location: PACU  Anesthesia Type: General  Level of Consciousness: awake, alert , oriented and patient cooperative  Airway & Oxygen Therapy: Patient Spontanous Breathing and Patient connected to nasal cannula oxygen  Post-op Assessment: Report given to PACU RN, Post -op Vital signs reviewed and stable and Patient moving all extremities  Post vital signs: Reviewed and stable  Complications: No apparent anesthesia complications

## 2012-06-09 NOTE — ED Notes (Signed)
Left leg is shortened and internally rotated. Left pedal pulses intact and strong. Sensation present in left lower extremity and pt can move toes and foot. Pt cannot move left leg due to pain.

## 2012-06-09 NOTE — Anesthesia Postprocedure Evaluation (Signed)
  Anesthesia Post-op Note  Patient: [name]  Procedure(s) Performed: Procedure(s) (LRB): INTRAMEDULLARY (IM) NAIL INTERTROCHANTERIC (Left)  Patient Location: PACU  Anesthesia Type: General  Level of Consciousness: awake  Airway and Oxygen Therapy: Patient Spontanous Breathing  Post-op Pain: mild  Post-op Assessment: Post-op Vital signs reviewed, Patient's Cardiovascular Status Stable, Respiratory Function Stable, Patent Airway, No signs of Nausea or vomiting and Pain level controlled  Post-op Vital Signs: stable  Complications: No apparent anesthesia complications

## 2012-06-09 NOTE — ED Provider Notes (Signed)
History     CSN: [id number]  Arrival date & time 06/09/12  1412   First MD Initiated Contact with Patient 06/09/12 1422      Chief Complaint  Patient presents with  . Fall    (Consider location/radiation/quality/duration/timing/severity/associated sxs/prior treatment) Patient is a 78 y.o. female presenting with fall. The history is provided by the patient.  Fall The accident occurred less than 1 hour ago. The fall occurred while walking. Distance fallen: from standing. She landed on concrete. There was no blood loss. The point of impact was the left hip. The pain is present in the left hip. The pain is moderate. She was not ambulatory at the scene. Pertinent negatives include no fever, no numbness, no abdominal pain, no nausea, no vomiting, no headaches, no loss of consciousness and no tingling. The symptoms are aggravated by pressure on the injury. Treatments tried: narcotics. The treatment provided moderate relief.    Past Medical History  Diagnosis Date  . Hyperlipidemia   . Mitral regurgitation     mild by echo 2003  . Allergic rhinitis   . Actinic keratosis   . Tenosynovitis     myxomatous  . Chronic constipation   . GERD (gastroesophageal reflux disease)   . Osteoarthritis   . Back pain   . Macular degeneration   . Hypertension     Past Surgical History  Procedure Date  . Synovectomy     of right long finger  . Hernial repain   . Inguinal hernia repair     laparoscopic preperitoneal repair of bilateral inguinal hernias  . Carpal tunnel release     bilateral  . Replacement total knee bilateral   . Tonisellectomy   . Tonisillectomy   . Tonsillectomy     Family History  Problem Relation Age of Onset  . Heart attack Father   . Heart attack Brother     History  Substance Use Topics  . Smoking status: Never Smoker   . Smokeless tobacco: Not on file  . Alcohol Use: 1.2 oz/week    2 Glasses of wine per week     2 glasses per week    OB History    Grav  Para Term Preterm Abortions TAB SAB Ect Mult Living                  Review of Systems  Constitutional: Negative for fever and chills.  HENT: Negative for neck pain and neck stiffness.   Eyes: Negative for visual disturbance.  Respiratory: Negative for shortness of breath.   Cardiovascular: Negative for chest pain.  Gastrointestinal: Negative for nausea, vomiting and abdominal pain.  Musculoskeletal: Negative for back pain.  Skin: Negative for pallor and wound.  Neurological: Negative for tingling, loss of consciousness, syncope, weakness, numbness and headaches.  All other systems reviewed and are negative.    Allergies  Aspirin; Morphine; Penicillins; and Sulfonamide derivatives  Home Medications   Current Outpatient Rx  Name Route Sig Dispense Refill  . CALCIUM-MAGNESIUM-VITAMIN D ER 600-40-500 MG-MG-UNIT PO TB24 Oral Take 2 capsules by mouth 2 (two) times daily.      Marland Kitchen CETIRIZINE HCL 10 MG PO TABS Oral Take 10 mg by mouth daily.      Marland Kitchen VITAMIN D3 1000 UNITS PO TABS Oral Take 1,000 Units by mouth daily.      . OMEGA-3 FATTY ACIDS 1000 MG PO CAPS Oral Take 1 g by mouth daily.      Marland Kitchen HYDROCODONE-ACETAMINOPHEN 7.5-500 MG PO TABS  1 tablet. 1 tab four times daily     . MAGNESIUM 250 MG PO TABS Oral Take by mouth.      . MULTIVITAMINS PO CAPS Oral Take 1 capsule by mouth daily.      Marland Kitchen PRESERVISION AREDS 2 PO Oral Take by mouth 2 (two) times daily.      Marland Kitchen VISION-VITE PRESERVE PO Oral Take by mouth.      . OLMESARTAN MEDOXOMIL 20 MG PO TABS Oral Take 20 mg by mouth daily.      Marland Kitchen OMEPRAZOLE 20 MG PO CPDR Oral Take 20 mg by mouth daily.      Marland Kitchen RANIBIZUMAB 0.5 MG/0.05ML IO SOLN Intraocular Inject into the eye. Every 6 weeks      . VITAMIN B-12 500 MCG PO TABS Oral Take 500 mcg by mouth daily.      . BENEFIBER DRINK MIX PO Oral Take by mouth. 2 tsp two times a day       BP 181/101  Pulse 75  Temp 97 F (36.1 C) (Oral)  Resp 20  SpO2 98%  Physical Exam  Nursing note and  vitals reviewed. Constitutional: She is oriented to person, place, and time. She appears well-developed and well-nourished. No distress.  HENT:  Head: Normocephalic and atraumatic.  Eyes: Pupils are equal, round, and reactive to light.  Neck: Full passive range of motion without pain. No spinous process tenderness present.  Cardiovascular: Normal rate, regular rhythm, normal heart sounds and intact distal pulses.   Pulmonary/Chest: Effort normal and breath sounds normal. No respiratory distress.  Abdominal: Soft. Bowel sounds are normal. She exhibits no distension. There is no tenderness.  Musculoskeletal:       Legs: Neurological: She is alert and oriented to person, place, and time.  Skin: Skin is warm and dry.  Psychiatric: She has a normal mood and affect.    ED Course  Procedures (including critical care time)  Labs Reviewed  CBC - Abnormal; Notable for the following:    WBC 16.7 (*)     RBC 3.86 (*)     Hemoglobin 11.5 (*)     HCT 33.5 (*)     All other components within normal limits  DIFFERENTIAL - Abnormal; Notable for the following:    Neutrophils Relative 87 (*)     Neutro Abs 14.4 (*)     Lymphocytes Relative 7 (*)     All other components within normal limits  COMPREHENSIVE METABOLIC PANEL  TYPE AND SCREEN  URINALYSIS, ROUTINE W REFLEX MICROSCOPIC   Dg Chest 1 View  06/09/2012  *RADIOLOGY REPORT*  Clinical Data: Recent fall, hip fracture  CHEST - 1 VIEW  Comparison: Chest x-ray of 09/22/2007  Findings: No active infiltrate or effusion is seen. There is cardiomegaly present.  There is a large retrocardiac density present most consistent with a large hiatal hernia which has increased in size in the intervening years.  There is a thoracolumbar scoliosis present with the bones being diffusely osteopenic.  Degenerative change is noted in the right shoulder as well.  IMPRESSION:  1.  No active lung disease. 2.  Moderate to large hiatal hernia. 3.  Cardiomegaly.  Original  Report Authenticated By: Juline Patch, M.D.   Dg Hip Complete Left  06/09/2012  *RADIOLOGY REPORT*  Clinical Data: Fall and left hip pain.  LEFT HIP - COMPLETE 2+ VIEW  Comparison: Acute abdominal series 02/25/2010  Findings: AP view of the pelvis and two views of the left hip demonstrate  a displaced and mildly comminuted fracture of the left proximal femur.  There is an intertrochanteric component to the fracture but there may also be a fracture involving the femoral neck.  The lateral view is limited but the left hip appears to be located.  Marked degenerative changes at the pubic symphysis. Evidence for scoliosis in the lower lumbar spine and diffuse osteopenia.  Pelvic bony ring appears to be intact.  IMPRESSION: Displaced and mildly comminuted fracture of the proximal left femur as described.  Original Report Authenticated By: Richarda Overlie, M.D.   Ct Head Wo Contrast  06/09/2012  *RADIOLOGY REPORT*  Clinical Data: Larey Seat.  Fracture right hip.  CT HEAD WITHOUT CONTRAST  Technique:  Contiguous axial images were obtained from the base of the skull through the vertex without contrast.  Comparison: 02/25/2010.  Findings: No skull fracture or intracranial hemorrhage.  Global atrophy without hydrocephalus.  Small vessel disease type changes without CT evidence of large acute infarct.  Vascular calcifications.  No intracranial mass lesion detected on this unenhanced exam.  IMPRESSION: No skull fracture or intracranial hemorrhage.  Original Report Authenticated By: Fuller Canada, M.D.     1. Intertrochanteric fracture of left hip       MDM  This is an 78 year old female who presents EMS after a fall. The patient states that she was using her tea instead of her walker because she was carrying a package from the drug store and it was raining out, she lost her balance and fell to the left side landing on her left hip. She was unable to stand back up to 2 her left hip pain. She denies hitting her head or any  other injuries at this point in time. Exam is remarkable for tenderness to the anterior left hip which is shortened and externally rotated. She is neurovascularly intact distally to this. Will get x-rays of the hip to evaluate, as well as a CT of the head due to her age and having a traumatic mechanism.  Tissues acute hip fracture, CT head unremarkable. We'll remainder of labs as well as chest x-ray for preop workup. Orthopedics was consulted regarding operative repair, and [facility] was admitted regarding admission. The patient and her family were updated with this plan.      Theotis Burrow, MD 06/09/12 1654

## 2012-06-10 LAB — CBC
HCT: 24.9 % — ABNORMAL LOW (ref 36.0–46.0)
MCH: 30 pg (ref 26.0–34.0)
MCHC: 34.5 g/dL (ref 30.0–36.0)
MCV: 86.8 fL (ref 78.0–100.0)
Platelets: 248 10*3/uL (ref 150–400)
RDW: 12.5 % (ref 11.5–15.5)
WBC: 9.1 10*3/uL (ref 4.0–10.5)

## 2012-06-10 LAB — COMPREHENSIVE METABOLIC PANEL
ALT: 9 U/L (ref 0–35)
Albumin: 3 g/dL — ABNORMAL LOW (ref 3.5–5.2)
Alkaline Phosphatase: 52 U/L (ref 39–117)
BUN: 12 mg/dL (ref 6–23)
Chloride: 95 mEq/L — ABNORMAL LOW (ref 96–112)
Glucose, Bld: 135 mg/dL — ABNORMAL HIGH (ref 70–99)
Potassium: 4.7 mEq/L (ref 3.5–5.1)
Sodium: 129 mEq/L — ABNORMAL LOW (ref 135–145)
Total Bilirubin: 0.5 mg/dL (ref 0.3–1.2)
Total Protein: 5.4 g/dL — ABNORMAL LOW (ref 6.0–8.3)

## 2012-06-10 MED ORDER — OXYCODONE-ACETAMINOPHEN 5-325 MG PO TABS
1.0000 | ORAL_TABLET | ORAL | Status: DC | PRN
  Administered 2012-06-10 – 2012-06-12 (×5): 2 via ORAL
  Administered 2012-06-12: 1 via ORAL
  Filled 2012-06-10 (×2): qty 2
  Filled 2012-06-10: qty 1
  Filled 2012-06-10 (×4): qty 2

## 2012-06-10 NOTE — Clinical Social Work Psychosocial (Signed)
     Clinical Social Work Department BRIEF PSYCHOSOCIAL ASSESSMENT 06/10/2012  Patient:  [name], [name]     Account Number:  [id number]     Admit date:  06/09/2012  Clinical Social Worker:  Burnard Hawthorne  Date/Time:  06/10/2012 03:48 PM  Referred by:  Physician  Date Referred:  06/10/2012 Referred for  SNF Placement   Other Referral:   Interview type:  Other - See comment Other interview type:   Met with patent and daughter [name]    PSYCHOSOCIAL DATA Living Status:  WITH ADULT CHILDREN Admitted from facility:   Level of care:   Primary support name:  [name] Primary support relationship to patient:  CHILD, ADULT Degree of support available:   Very invovled and supportive;  patinet lived with son who is also very supportive but he works during the day.    CURRENT CONCERNS Current Concerns  Post-Acute Placement   Other Concerns:    SOCIAL WORK ASSESSMENT / PLAN CSW referred for short term SNF. Met with patient and her daughter [name] this afernoon. Patient lives with her son but has maintained a high level of independence-- still drives etc.  Patient requests short term SNF. Discussed bed search process and patient/daughter requrests [facility] .  Fl2 placed on chart for MD's signature. Bed search will be initated.  Spoke with Dr. Victorino Dike- orthopedics- anticipates stability for d/c on Monday 06/12/12.   Assessment/plan status:  Psychosocial Support/Ongoing Assessment of Needs Other assessment/ plan:   Information/referral to community resources:   SNF bed list provided  Discussed after care issues once she completes SNF rehab- she states she has all DME at home. May need HH at d/c.    PATIENTS/FAMILYS RESPONSE TO PLAN OF CARE: Patient is alert and oriented- very pleasant lady. She requests short term SNF and plans to return home with her son after rehab. Daughter is fully supportive of this plan and states siblings are also in agreement.

## 2012-06-10 NOTE — Progress Notes (Signed)
[facility] PROGRESS NOTE  [name] ZOX:[id number] DOB: 27-Nov-1926 DOA: 06/09/2012   Assessment/Plan: Patient Active Hospital Problem List: Hip fx (06/09/2012) -s/p intramedullary nailing 7/11. -pain controllled.  GERD (01/15/2010) -continue PPI.  Leukocytosis (06/09/2012) Resolved.  Chronic Back pain  -continue narcotics.  Disposition Plan: SNF     LOS: 1 day   Procedures:  intramedullary nailing 7/11.  Antibiotics:  none   Subjective: Pain under controlled. Able to move with PT.very confident  Objective: Filed Vitals:   06/09/12 2145 06/09/12 2156 06/10/12 0558 06/10/12 0800  BP:  156/90 108/63   Pulse:  102 94   Temp: 97.8 F (36.6 C) 97.8 F (36.6 C) 97.6 F (36.4 C)   TempSrc:   Oral   Resp:  16 16 16   SpO2:  100% 98% 96%    Intake/Output Summary (Last 24 hours) at 06/10/12 1156 Last data filed at 06/10/12 0600  Gross per 24 hour  Intake   2400 ml  Output   1600 ml  Net    800 ml   Weight change:   Exam:  General: Alert, awake, oriented x3, in no acute distress.  HEENT: No bruits, no goiter.  Heart: Regular rate and rhythm, without murmurs, rubs, gallops.  Lungs: Good air movement, bilateral air movement.  Abdomen: Soft, nontender, nondistended, positive bowel sounds.  Neuro: Grossly intact, nonfocal.   Data Reviewed: Basic Metabolic Panel:  Lab 06/10/12 8119 06/09/12 1602  NA 129* 133*  K 4.7 4.7  CL 95* 97  CO2 25 25  GLUCOSE 135* 140*  BUN 12 12  CREATININE 0.55 0.58  CALCIUM 8.1* 9.1  MG -- --  PHOS -- --   Liver Function Tests:  Lab 06/10/12 0620 06/09/12 1602  AST 21 20  ALT 9 11  ALKPHOS 52 69  BILITOT 0.5 0.3  PROT 5.4* 6.8  ALBUMIN 3.0* 3.9   No results found for this basename: LIPASE:5,AMYLASE:5 in the last 168 hours No results found for this basename: AMMONIA:5 in the last 168 hours CBC:  Lab 06/10/12 0620 06/09/12 1602  WBC 9.1 16.7*  NEUTROABS -- 14.4*  HGB 8.6* 11.5*  HCT 24.9* 33.5*    MCV 86.8 86.8  PLT 248 307   Cardiac Enzymes: No results found for this basename: CKTOTAL:5,CKMB:5,CKMBINDEX:5,TROPONINI:5 in the last 168 hours BNP: No components found with this basename: POCBNP:5 CBG: No results found for this basename: GLUCAP:5 in the last 168 hours  No results found for this or any previous visit (from the past 240 hour(s)).   Studies: Dg Chest 1 View  06/09/2012  *RADIOLOGY REPORT*  Clinical Data: Recent fall, hip fracture  CHEST - 1 VIEW  Comparison: Chest x-ray of 09/22/2007  Findings: No active infiltrate or effusion is seen. There is cardiomegaly present.  There is a large retrocardiac density present most consistent with a large hiatal hernia which has increased in size in the intervening years.  There is a thoracolumbar scoliosis present with the bones being diffusely osteopenic.  Degenerative change is noted in the right shoulder as well.  IMPRESSION:  1.  No active lung disease. 2.  Moderate to large hiatal hernia. 3.  Cardiomegaly.  Original Report Authenticated By: Juline Patch, M.D.   Dg Hip Complete Left  06/09/2012  *RADIOLOGY REPORT*  Clinical Data: Fall and left hip pain.  LEFT HIP - COMPLETE 2+ VIEW  Comparison: Acute abdominal series 02/25/2010  Findings: AP view of the pelvis and two views of the left hip demonstrate a  displaced and mildly comminuted fracture of the left proximal femur.  There is an intertrochanteric component to the fracture but there may also be a fracture involving the femoral neck.  The lateral view is limited but the left hip appears to be located.  Marked degenerative changes at the pubic symphysis. Evidence for scoliosis in the lower lumbar spine and diffuse osteopenia.  Pelvic bony ring appears to be intact.  IMPRESSION: Displaced and mildly comminuted fracture of the proximal left femur as described.  Original Report Authenticated By: Richarda Overlie, M.D.   Dg Hip Operative Left  06/09/2012  *RADIOLOGY REPORT*  Clinical Data:  Left-sided intramedullary nail placement.  OPERATIVE LEFT HIP  Comparison: Left hip radiographs performed earlier today at 02:55 p.m.  Findings: There has been interval placement of an intramedullary rod and screws through the proximal left femur.  The hardware appears intact.  Alignment is improved, though there is persistent posterior displacement at the fracture site.  No new fractures are seen.  The soft tissues are difficult to fully assess.  A total of four fluoroscopic C-arm images are provided for evaluation.  IMPRESSION: Successful placement of intramedullary rod and screws through the proximal left femur.  Hardware appears intact, with improved alignment, though mild posterior displacement is seen.  No new fractures seen.  Original Report Authenticated By: Tonia Ghent, M.D.   Ct Head Wo Contrast  06/09/2012  *RADIOLOGY REPORT*  Clinical Data: Larey Seat.  Fracture right hip.  CT HEAD WITHOUT CONTRAST  Technique:  Contiguous axial images were obtained from the base of the skull through the vertex without contrast.  Comparison: 02/25/2010.  Findings: No skull fracture or intracranial hemorrhage.  Global atrophy without hydrocephalus.  Small vessel disease type changes without CT evidence of large acute infarct.  Vascular calcifications.  No intracranial mass lesion detected on this unenhanced exam.  IMPRESSION: No skull fracture or intracranial hemorrhage.  Original Report Authenticated By: Fuller Canada, M.D.    Scheduled Meds:   . calcium-vitamin D  2 tablet Oral BID   And  . magnesium chloride  1 tablet Oral BID  .  ceFAZolin (ANCEF) IV  2 g Intravenous Q6H  . docusate sodium  100 mg Oral BID  . enoxaparin (LOVENOX) injection  40 mg Subcutaneous Q24H  . fentaNYL  50 mcg Intravenous Once  . ferrous sulfate  325 mg Oral TID PC  . pantoprazole  40 mg Oral Q1200  . scopolamine  1 patch Transdermal Q72H  . senna  1 tablet Oral BID  . sodium chloride  3 mL Intravenous Q12H  . trimethoprim  100  mg Oral Daily  . DISCONTD: Calcium-Magnesium-Vitamin D  2 capsule Oral BID  . DISCONTD:  HYDROmorphone (DILAUDID) injection  1 mg Intravenous Once  . DISCONTD: scopolamine  1 patch Transdermal Q72H   Continuous Infusions:   . sodium chloride    . DISCONTD: lactated ringers 50 mL/hr at 06/09/12 1810    [name], MD  [facility] Pager [phone]  If 7PM-7AM, please contact night-coverage [email or web address] Password [facility] 06/10/2012, 11:56 AM

## 2012-06-10 NOTE — Evaluation (Signed)
Physical Therapy Evaluation Patient Details Name: [name] MRN: [id number] DOB: May 11, 1926 Today's Date: 06/10/2012 Time: 4098-1191 PT Time Calculation (min): 29 min  PT Assessment / Plan / Recommendation Clinical Impression  Pt s/p fall presenting wit IM nail to L hip. Pt OOB mobilty tolerance limited by pain and fear of falling. Patient and family agree to rehab facility due to patient independent PTA but now requires maxA for all mobility. Social Work notified.    PT Assessment  Patient needs continued PT services    Follow Up Recommendations  Skilled nursing facility;Supervision/Assistance - 24 hour    Barriers to Discharge Decreased caregiver support pt alone during the day, pt independent PTA    Equipment Recommendations  Defer to next venue    Recommendations for Other Services     Frequency Min 5X/week    Precautions / Restrictions Precautions Precautions: Fall Restrictions LLE Weight Bearing: Weight bearing as tolerated   Pertinent Vitals/Pain 5/10 L hip pain, increased with mobility however pt did not rate      Mobility  Bed Mobility Bed Mobility: Supine to Sit Supine to Sit: 1: +2 Total assist;With rails;HOB elevated Supine to Sit: Patient Percentage: 40% Details for Bed Mobility Assistance: max directional v/c's, assist for trunk elevation and L LE management Transfers Transfers: Sit to Stand;Stand to Sit;Stand Pivot Transfers Sit to Stand: 1: +2 Total assist;With upper extremity assist;From bed Sit to Stand: Patient Percentage: 50% Stand to Sit: 1: +2 Total assist;Without upper extremity assist;To chair/3-in-1 Stand to Sit: Patient Percentage: 50% Stand Pivot Transfers: 1: +2 Total assist (with RW) Stand Pivot Transfers: Patient Percentage: 50% Details for Transfer Assistance: pt with increased anxiety re: falling, max directional v/c's for walker management and sequencing. max v/c's to increase UE WBing, assist to move L  LE Ambulation/Gait Ambulation/Gait Assistance: Not tested (comment) (pt unable to tolerate at this time)    Exercises General Exercises - Lower Extremity Quad Sets: AROM;Left;10 reps;Supine   PT Diagnosis: Difficulty walking;Generalized weakness;Acute pain  PT Problem List: Decreased strength;Decreased range of motion;Decreased activity tolerance;Decreased balance;Decreased mobility PT Treatment Interventions: DME instruction;Gait training;Stair training;Functional mobility training;Therapeutic activities;Therapeutic exercise   PT Goals Acute Rehab PT Goals PT Goal Formulation: With patient Time For Goal Achievement: 06/24/12 Potential to Achieve Goals: Good Pt will go Supine/Side to Sit: with supervision;with HOB 0 degrees PT Goal: Supine/Side to Sit - Progress: Goal set today Pt will go Sit to Stand: with supervision;with upper extremity assist (up to RW.) PT Goal: Sit to Stand - Progress: Goal set today Pt will Transfer Bed to Chair/Chair to Bed: with supervision (with RW.) PT Transfer Goal: Bed to Chair/Chair to Bed - Progress: Goal set today Pt will Ambulate: 16 - 50 feet;with supervision;with rolling walker PT Goal: Ambulate - Progress: Goal set today  Visit Information  Last PT Received On: 06/10/12 Assistance Needed: +2    Subjective Data  Subjective: Pt received supine in bed  with report of 5/10 L hip pain.   Prior Functioning  Home Living Lives With: Son Available Help at Discharge:  (son works during the day, only there at night) Type of Home: House Home Access: Stairs to enter Secretary/administrator of Steps: 4 Entrance Stairs-Rails: Can reach both Home Layout: Able to live on main level with bedroom/bathroom Bathroom Shower/Tub: Health visitor: Handicapped height Bathroom Accessibility: Yes How Accessible: Accessible via walker Home Adaptive Equipment: Walker - rolling;Bedside commode/3-in-1 Additional Comments: pt used RW and cane at home  PTA Prior Function Level of  Independence: Independent with assistive device(s) Able to Take Stairs?: Yes Driving: Yes Vocation: Retired Musician: No difficulties Dominant Hand: Right    Cognition  Overall Cognitive Status: Appears within functional limits for tasks assessed/performed Arousal/Alertness: Awake/alert Orientation Level: Appears intact for tasks assessed Behavior During Session: [facility] for tasks performed    Extremity/Trunk Assessment Right Upper Extremity Assessment RUE ROM/Strength/Tone: Within functional levels Left Upper Extremity Assessment LUE ROM/Strength/Tone: Within functional levels Right Lower Extremity Assessment RLE ROM/Strength/Tone: Within functional levels Left Lower Extremity Assessment LLE ROM/Strength/Tone: Deficits;Due to pain LLE ROM/Strength/Tone Deficits: minimal quad set, limited voluntary L LE mvmt due to pain Trunk Assessment Trunk Assessment: Kyphotic   Balance    End of Session PT - End of Session Equipment Utilized During Treatment: Gait belt Activity Tolerance: Patient limited by pain Patient left: in chair;with call bell/phone within reach;with family/visitor present Nurse Communication: Mobility status;Weight bearing status  GP     Marcene Brawn 06/10/2012, 9:44 AM  Lewis Shock, PT, DPT Pager #: [phone] Office #: [phone]

## 2012-06-10 NOTE — Op Note (Signed)
[name], [name] NO.:  [id number]  MEDICAL RECORD NO.:  [id number]  LOCATION:  5N25C                        FACILITY:  [facility]  PHYSICIAN:  Toni Arthurs, MD        DATE OF BIRTH:  February 11, 1926  DATE OF PROCEDURE:  06/09/2012 DATE OF DISCHARGE:                              OPERATIVE REPORT   PREOPERATIVE DIAGNOSIS:  Left hip intertrochanteric fracture.  POSTOPERATIVE DIAGNOSIS:  Left hip intertrochanteric fracture.  PROCEDURES: 1. Intramedullary nailing of left hip intertrochanteric fracture. 2. Intraoperative interpretation of fluoroscopic imaging greater than     1 hour.  SURGEON:  Toni Arthurs, MD  ANESTHESIA:  General.  ESTIMATED BLOOD LOSS:  300 mL.  TOURNIQUET TIME:  Zero.  COMPLICATIONS:  None apparent.  DISPOSITION:  Extubated, awake and stable to recovery.  INDICATIONS FOR PROCEDURE:  The patient is an 78 year old woman who fell today at the drug store from standing height.  She was brought to the emergency room by EMS.  X-rays revealed a left hip intertrochanteric fracture.  She presents now for operative treatment of this displaced hip fracture.  She understands the risks and benefits, the alternative treatment options and elects surgical treatment.  She specifically understands the risks of bleeding, infection, nerve damage, blood clots, need for additional surgery, amputation, and death.  PROCEDURE IN DETAIL:  After preoperative consent was obtained and the correct operative site was identified, the patient was brought to the operating room supine on a gurney.  General anesthesia was induced. Preoperative antibiotics were administered.  The patient was then moved on the fracture table.  The left lower extremity was positioned in a traction boot.  The right lower extremity was positioned flexed, externally rotated and abducted in a well leg holder.  A reduction maneuver was performed with traction, external rotation, and abduction, and  then the hip was repositioned into internal rotation and adduction. AP and lateral fluoroscopic images were obtained showing appropriate alignment in the coronal plane, but flexion of the proximal fragment. The hip was flexed slightly more and posterior pressure was placed on the proximal portion of the thigh.  This reduced the fracture to an acceptable alignment.  The left lower extremity was then prepped and draped in standard sterile fashion.  A surgical time-out was taken.  A longitudinal incision was made 3 fingerbreadths proximal from the tip of the greater trochanter.  Blunt dissection was carried through the subcutaneous tissue.  The gluteal fascia was incised.  Blunt dissection was carried down through the gluteal musculature to the tip of the greater trochanter.  The awl was used to locate the tip of the greater trochanter and a guidewire was inserted in line with the femoral canal. The starter reamer was used to open the proximal end of the femoral canal.  At this point, a reduction tool was inserted into the medullary canal, and an attempt was made to position the flexed fragment in line with the distal fragment.  This was unsuccessful.  A stab incision was then made over the anterior aspect of the thigh.  Blunt dissection was carried down through the skin and subcutaneous tissue to the level of the proximal fragment.  A  ball-spike pusher was then inserted through the incision to the anterior aspect of the proximal femoral fragment. The fracture was then reduced and the guidepin was advanced across the fracture site.  A 12.5-mm reamer was then advanced down the femoral canal with ease.  The guidepin was measured and a 360-mm nail was selected.  It was inserted down through the femoral canal across the fracture site.  The fracture fragment was still slightly flexed and this was held in a reduced position as the nail was passed across the fracture site.  Once the fracture was  seated appropriately on the AP view, the lateral alignment was obtained on the lateral view.  A stab incision was then made at the lateral aspect of the thigh and the drill sleeve was inserted through the subcutaneous tissue at the lateral aspect of the femur.  The drill sleeve was seated.  A guidepin was then inserted up to the lateral aspect of the femur up to the femoral neck and into the femoral head.  Appropriate position was confirmed on AP and lateral fluoroscopic images.  A 105-mm screw was selected.  The guidepin was overdrilled and the screw was inserted.  It was inserted to the appropriate depth while holding the fracture in a reduced position. Given the rotational nature of this fracture, the decision was made at a second derotation screw.  The targeting jig was again used to align the more proximal screw hole.  It was drilled and 85-mm derotation screw was inserted.  Again, AP and lateral views confirmed appropriate position of both screws.  The traction was released and the fracture was allowed to impact.  Distally, the perfect circle technique was used to insert a single interlocking screw from lateral to medial in the static hole. Final AP and lateral views of the fracture and of the hip showed appropriate position and length of all hardware and acceptable reduction of the fracture.  Distally, the AP and lateral views were obtained showing appropriate position and length of the nail and the interlocking screw.  All wounds were irrigated copiously.  The gluteal fascia was repaired with 0 Vicryl simple sutures.  Subcutaneous tissue was approximated with inverted simple sutures of 2-0 Vicryl.  The skin was closed with staples.  The remaining incisions were closed in similar fashion.  Sterile dressings were applied.  The patient was awakened by Anesthesia and transported to recovery room in stable condition.  FOLLOWUP PLAN:  The patient will be weightbearing as tolerated on  her left lower extremity.  She will be admitted to the Internal Medicine Service and will begin physical therapy and occupational therapy tomorrow.     Toni Arthurs, MD     JH/MEDQ  D:  06/09/2012  T:  06/10/2012  Job:  [id number]  cc:   Soyla Murphy. Renne Crigler, M.D.

## 2012-06-10 NOTE — Progress Notes (Signed)
Orthopedic Tech Progress Note Patient Details:  [name] 1926-09-06 [id number]  Patient ID: [name], female   DOB: 1926-07-01, 78 y.o.   MRN: [id number] OHF applied to pt bed.  Mica Ramdass T 06/10/2012, 10:31 AM

## 2012-06-10 NOTE — Progress Notes (Signed)
CARE MANAGEMENT NOTE 06/10/2012  Patient:  [name], [name]   Account Number:  [id number]  Date Initiated:  06/10/2012  Documentation initiated by:  Vance Peper  Subjective/Objective Assessment:   78 yr old female s/p left hip fracture.     Action/Plan:   Patient wil be going to [facility] for shortterm rehab. Social worker aware.   Anticipated DC Date:  06/13/2012   Anticipated DC Plan:  SKILLED NURSING FACILITY      DC Planning Services  CM consult      Choice offered to / List presented to:             Status of service:  Completed, signed off  Discharge Disposition:  SKILLED NURSING FACILITY

## 2012-06-10 NOTE — Clinical Social Work Placement (Addendum)
    Clinical Social Work Department CLINICAL SOCIAL WORK PLACEMENT NOTE 06/10/2012  Patient:  ANGELICIA, LESSNER  Account Number:  [id number] Admit date:  06/09/2012  Clinical Social Worker:  Lupita Leash Abbegail Matuska, BSW  Date/time:  06/10/2012 04:00 PM  Clinical Social Work is seeking post-discharge placement for this patient at the following level of care:   SKILLED NURSING   (*CSW will update this form in Epic as items are completed)   06/10/2012  Patient/family provided with [facility] Department of Clinical Social Work's list of facilities offering this level of care within the geographic area requested by the patient (or if unable, by the patient's family).  06/10/2012  Patient/family informed of their freedom to choose among providers that offer the needed level of care, that participate in Medicare, Medicaid or managed care program needed by the patient, have an available bed and are willing to accept the patient.  06/10/2012  Patient/family informed of [facility]' ownership interest in [facility], as well as of the fact that they are under no obligation to receive care at this facility.  PASARR submitted to EDS on 06/10/2012 PASARR number received from EDS on 06/10/2012  FL2 transmitted to all facilities in geographic area requested by pt/family on  06/10/2012 FL2 transmitted to all facilities within larger geographic area on   Patient informed that his/her managed care company has contracts with or will negotiate with  certain facilities, including the following:   NA- has Medicare     Patient/family informed of bed offers received:  05/1212 Patient chooses bed at  [facility]  Physician recommends and patient chooses bed at    Patient to be transferred to [facility]  on  06/13/12 Patient to be transferred to facility by ambulance Sharin Mons)  The following physician request were entered in Epic:   Additional Comments: DC to SNF today,  Patient and daughter are very happy about d/c plan. Daughter went to SNF to sign paper work. Notified SNF and nursing of above.   Lorri Frederick. Janeene Sand, LCSWA  [phone]

## 2012-06-10 NOTE — Progress Notes (Signed)
UR COMPLETED  

## 2012-06-10 NOTE — Progress Notes (Signed)
Subjective: 1 Day Post-Op Procedure(s) (LRB): INTRAMEDULLARY (IM) NAIL INTERTROCHANTERIC (Left) Patient reports pain as mild.  Denies numbness, tingling or weakness in L LE.  Objective: Vital signs in last 24 hours: Temp:  [97.5 F (36.4 C)-97.8 F (36.6 C)] 97.6 F (36.4 C) (07/12 0558) Pulse Rate:  [83-102] 94  (07/12 0558) Resp:  [16-17] 17  (07/12 1200) BP: (108-156)/(61-90) 108/63 mmHg (07/12 0558) SpO2:  [96 %-100 %] 97 % (07/12 1200)  Intake/Output from previous day: 07/11 0701 - 07/12 0700 In: 2400 [I.V.:2350; IV Piggyback:50] Out: 1600 [Urine:1350; Blood:250] Intake/Output this shift:     Basename 06/10/12 0620 06/09/12 1602  HGB 8.6* 11.5*    Basename 06/10/12 0620 06/09/12 1602  WBC 9.1 16.7*  RBC 2.87* 3.86*  HCT 24.9* 33.5*  PLT 248 307    Basename 06/10/12 0620 06/09/12 1602  NA 129* 133*  K 4.7 4.7  CL 95* 97  CO2 25 25  BUN 12 12  CREATININE 0.55 0.58  GLUCOSE 135* 140*  CALCIUM 8.1* 9.1   No results found for this basename: LABPT:2,INR:2 in the last 72 hours  wound dressed and dry.  NVI L LE.  Assessment/Plan: 1 Day Post-Op Procedure(s) (LRB): INTRAMEDULLARY (IM) NAIL INTERTROCHANTERIC (Left) Up with therapy  WBAT on L LE.  OK to start anticoagulation.  Pt will need lovenox for 2 weeks post op.  From ortho perspective pt can be discharged when stable.  Will need to follow up with me in two weeks for staple removal.  Gemma Ruan 06/10/2012, 3:13 PM

## 2012-06-11 ENCOUNTER — Inpatient Hospital Stay (HOSPITAL_COMMUNITY): Payer: Medicare Other

## 2012-06-11 DIAGNOSIS — E871 Hypo-osmolality and hyponatremia: Secondary | ICD-10-CM | POA: Diagnosis present

## 2012-06-11 DIAGNOSIS — Z8719 Personal history of other diseases of the digestive system: Secondary | ICD-10-CM

## 2012-06-11 DIAGNOSIS — M549 Dorsalgia, unspecified: Secondary | ICD-10-CM

## 2012-06-11 LAB — URINALYSIS, ROUTINE W REFLEX MICROSCOPIC
Bilirubin Urine: NEGATIVE
Glucose, UA: NEGATIVE mg/dL
Hgb urine dipstick: NEGATIVE
Specific Gravity, Urine: 1.013 (ref 1.005–1.030)

## 2012-06-11 LAB — CBC
HCT: 19.7 % — ABNORMAL LOW (ref 36.0–46.0)
Hemoglobin: 6.8 g/dL — CL (ref 12.0–15.0)
MCHC: 34.5 g/dL (ref 30.0–36.0)
RBC: 2.26 MIL/uL — ABNORMAL LOW (ref 3.87–5.11)

## 2012-06-11 LAB — BASIC METABOLIC PANEL
BUN: 10 mg/dL (ref 6–23)
CO2: 25 mEq/L (ref 19–32)
Glucose, Bld: 120 mg/dL — ABNORMAL HIGH (ref 70–99)
Potassium: 4.1 mEq/L (ref 3.5–5.1)
Sodium: 126 mEq/L — ABNORMAL LOW (ref 135–145)

## 2012-06-11 MED ORDER — IRBESARTAN 75 MG PO TABS
75.0000 mg | ORAL_TABLET | Freq: Every day | ORAL | Status: DC
  Administered 2012-06-11 – 2012-06-12 (×2): 75 mg via ORAL
  Filled 2012-06-11 (×3): qty 1

## 2012-06-11 NOTE — Progress Notes (Signed)
Occupational Therapy Evaluation Patient Details Name: [name] MRN: [id number] DOB: 1926-07-14 Today's Date: 06/11/2012 Time: 1640-1700 OT Time Calculation (min): 20 min  OT Assessment / Plan / Recommendation Clinical Impression  78 yo s/p fall with resulting L hip fracture. IM nail. WBAT. Pt will bnefit from skilled OT services to max independence  with ADL and functional mobility for ADL to facilitate d/C to next venue of care. Pt will need rehab at SNF before returning home to live with son.    OT Assessment  Patient needs continued OT Services    Follow Up Recommendations  Skilled nursing facility    Barriers to Discharge Decreased caregiver support    Equipment Recommendations  Defer to next venue    Recommendations for Other Services    Frequency  Min 2X/week    Precautions / Restrictions Precautions Precautions: Fall Restrictions Weight Bearing Restrictions: Yes LLE Weight Bearing: Weight bearing as tolerated   Pertinent Vitals/Pain 3.    ADL  Eating/Feeding: Performed;Independent Where Assessed - Eating/Feeding: Chair Grooming: Simulated;Set up Where Assessed - Grooming: Supported sitting Upper Body Bathing: Simulated;Minimal assistance Where Assessed - Upper Body Bathing: Unsupported sitting Lower Body Bathing: Simulated;+1 Total assistance Where Assessed - Lower Body Bathing: Supported sit to stand Upper Body Dressing: Simulated;Minimal assistance Where Assessed - Upper Body Dressing: Unsupported sitting Lower Body Dressing: Simulated;+1 Total assistance Where Assessed - Lower Body Dressing: Supported sit to stand Toilet Transfer: Performed;+2 Total assistance Toilet Transfer: Patient Percentage: 70% Statistician Method: Surveyor, minerals: Materials engineer and Hygiene: Performed;+1 Total assistance Where Assessed - Glass blower/designer Manipulation and Hygiene: Standing Transfers/Ambulation  Related to ADLs: total A +2. Pt requires max vc to position self in RW. ADL Comments: A for LB ADL. Will benefit from use of AE    OT Diagnosis: Generalized weakness;Acute pain  OT Problem List: Decreased strength;Decreased range of motion;Decreased activity tolerance;Impaired balance (sitting and/or standing);Decreased knowledge of use of DME or AE;Decreased knowledge of precautions;Pain OT Treatment Interventions: Self-care/ADL training;Therapeutic exercise;Energy conservation;DME and/or AE instruction;Therapeutic activities;Patient/family education;Balance training   OT Goals Acute Rehab OT Goals OT Goal Formulation: With patient Time For Goal Achievement: 06/18/12 Potential to Achieve Goals: Good ADL Goals Pt Will Transfer to Toilet: with mod assist;3-in-1;with cueing (comment type and amount) ADL Goal: Toilet Transfer - Progress: Goal set today Pt Will Perform Toileting - Hygiene: with min assist;Sit to stand from 3-in-1/toilet ADL Goal: Toileting - Hygiene - Progress: Goal set today Additional ADL Goal #1: complete bed mobility with mod A in preparation for ADL retraining. ADL Goal: Additional Goal #1 - Progress: Goal set today  Visit Information  Last OT Received On: 06/11/12 Assistance Needed: +2    Subjective Data      Prior Functioning  Vision/Perception  Home Living Lives With: Son Available Help at Discharge:  (son works during the day, only there at night) Type of Home: House Home Access: Stairs to enter Secretary/administrator of Steps: 4 Entrance Stairs-Rails: Can reach both Home Layout: Able to live on main level with bedroom/bathroom Bathroom Shower/Tub: Health visitor: Handicapped height Bathroom Accessibility: Yes How Accessible: Accessible via walker Home Adaptive Equipment: Walker - rolling;Bedside commode/3-in-1 Additional Comments: pt used RW and cane at home PTA Prior Function Level of Independence: Independent with assistive  device(s) Able to Take Stairs?: Yes Driving: Yes Vocation: Retired Musician: No difficulties Dominant Hand: Right      Cognition  Overall Cognitive Status: Appears within functional limits  for tasks assessed/performed Arousal/Alertness: Awake/alert Orientation Level: Appears intact for tasks assessed Behavior During Session: [facility] for tasks performed Cognition - Other Comments: pt reports she has been experiencing some confusion - most likely related to pain meds    Extremity/Trunk Assessment Right Upper Extremity Assessment RUE ROM/Strength/Tone: [facility] for tasks assessed Left Upper Extremity Assessment LUE ROM/Strength/Tone: WFL for tasks assessed Trunk Assessment Trunk Assessment: Kyphotic (forward head)   Mobility Bed Mobility Bed Mobility: Not assessed Transfers Transfers: Sit to Stand;Stand to Sit Sit to Stand: 1: +2 Total assist;With upper extremity assist;From chair/3-in-1 Sit to Stand: Patient Percentage: 60% Stand to Sit: With upper extremity assist;To chair/3-in-1 Stand to Sit: Patient Percentage: 60% Details for Transfer Assistance: vc for technique, hand placement and position inside of walker   Exercise General Exercises - Lower Extremity Ankle Circles/Pumps: AROM;Both;10 reps;Seated Quad Sets: AROM;Left;10 reps;Supine Long Arc Quad: Left;AROM;10 reps;Seated  Balance Balance Balance Assessed:  (poor static standing)  End of Session OT - End of Session Equipment Utilized During Treatment: Gait belt Activity Tolerance: Patient limited by fatigue;Patient limited by pain Patient left: with call bell/phone within reach;with nursing in room;Other (comment) (3 in 1) Nurse Communication: Mobility status  GO     Rosabella Edgin,HILLARY 06/11/2012, 5:54 PM [facility], OTR/L  [phone] 06/11/2012

## 2012-06-11 NOTE — Progress Notes (Addendum)
[facility] PROGRESS NOTE  [name] NFA:[id number] DOB: [birth year] DOA: 06/09/2012   Assessment/Plan: Patient Active Hospital Problem List: Hip fx (06/09/2012) -s/p intramedullary nailing 7/11. -pain controllled. -Pt consulted, will need SNF.  Hyponatremia: -check Urinary sodium, creatinine, urine osmolarity and KVO IV fluids. -b-met in am.  GERD (01/15/2010) -continue PPI.  Leukocytosis (06/09/2012) Resolved.  Chronic Back pain  -continue narcotics.  Disposition Plan: SNF     LOS: 2 days   Procedures:  intramedullary nailing 7/11.  Antibiotics:  none   Subjective: Pain under controlled. No complains  Objective: Filed Vitals:   06/10/12 2056 06/11/12 0504 06/11/12 0900 06/11/12 0925  BP: 103/54 126/55 107/52 112/58  Pulse: 79 90 97 96  Temp: 98.4 F (36.9 C) 97.9 F (36.6 C) 98.7 F (37.1 C) 98.2 F (36.8 C)  TempSrc: Oral Oral Oral Oral  Resp: 18 18 18 18   SpO2: 97% 98%      Intake/Output Summary (Last 24 hours) at 06/11/12 1210 Last data filed at 06/11/12 4696  Gross per 24 hour  Intake    120 ml  Output    500 ml  Net   -380 ml   Weight change:   Exam:  General: Alert, awake, oriented x3, in no acute distress.  HEENT: No bruits, no goiter.  Heart: Regular rate and rhythm, without murmurs, rubs, gallops.  Lungs: Good air movement, bilateral air movement.     Data Reviewed: Basic Metabolic Panel:  Lab 06/11/12 2952 06/10/12 0620 06/09/12 1602  NA 126* 129* 133*  K 4.1 4.7 --  CL 93* 95* 97  CO2 25 25 25   GLUCOSE 120* 135* 140*  BUN 10 12 12   CREATININE 0.50 0.55 0.58  CALCIUM 8.0* 8.1* 9.1  MG -- -- --  PHOS -- -- --   Liver Function Tests:  Lab 06/10/12 0620 06/09/12 1602  AST 21 20  ALT 9 11  ALKPHOS 52 69  BILITOT 0.5 0.3  PROT 5.4* 6.8  ALBUMIN 3.0* 3.9   No results found for this basename: LIPASE:5,AMYLASE:5 in the last 168 hours No results found for this basename: AMMONIA:5 in the last 168  hours CBC:  Lab 06/11/12 0610 06/10/12 0620 06/09/12 1602  WBC 11.5* 9.1 16.7*  NEUTROABS -- -- 14.4*  HGB 6.8* 8.6* 11.5*  HCT 19.7* 24.9* 33.5*  MCV 87.2 86.8 86.8  PLT 202 248 307   Cardiac Enzymes: No results found for this basename: CKTOTAL:5,CKMB:5,CKMBINDEX:5,TROPONINI:5 in the last 168 hours BNP: No components found with this basename: POCBNP:5 CBG: No results found for this basename: GLUCAP:5 in the last 168 hours  No results found for this or any previous visit (from the past 240 hour(s)).   Studies: Dg Chest 1 View  06/09/2012  *RADIOLOGY REPORT*  Clinical Data: Recent fall, hip fracture  CHEST - 1 VIEW  Comparison: Chest x-ray of 09/22/2007  Findings: No active infiltrate or effusion is seen. There is cardiomegaly present.  There is a large retrocardiac density present most consistent with a large hiatal hernia which has increased in size in the intervening years.  There is a thoracolumbar scoliosis present with the bones being diffusely osteopenic.  Degenerative change is noted in the right shoulder as well.  IMPRESSION:  1.  No active lung disease. 2.  Moderate to large hiatal hernia. 3.  Cardiomegaly.  Original Report Authenticated By: Juline Patch, M.D.   Dg Hip Complete Left  06/09/2012  *RADIOLOGY REPORT*  Clinical Data: Fall and left hip pain.  LEFT HIP - COMPLETE 2+ VIEW  Comparison: Acute abdominal series 02/25/2010  Findings: AP view of the pelvis and two views of the left hip demonstrate a displaced and mildly comminuted fracture of the left proximal femur.  There is an intertrochanteric component to the fracture but there may also be a fracture involving the femoral neck.  The lateral view is limited but the left hip appears to be located.  Marked degenerative changes at the pubic symphysis. Evidence for scoliosis in the lower lumbar spine and diffuse osteopenia.  Pelvic bony ring appears to be intact.  IMPRESSION: Displaced and mildly comminuted fracture of the  proximal left femur as described.  Original Report Authenticated By: Richarda Overlie, M.D.   Dg Hip Operative Left  06/09/2012  *RADIOLOGY REPORT*  Clinical Data: Left-sided intramedullary nail placement.  OPERATIVE LEFT HIP  Comparison: Left hip radiographs performed earlier today at 02:55 p.m.  Findings: There has been interval placement of an intramedullary rod and screws through the proximal left femur.  The hardware appears intact.  Alignment is improved, though there is persistent posterior displacement at the fracture site.  No new fractures are seen.  The soft tissues are difficult to fully assess.  A total of four fluoroscopic C-arm images are provided for evaluation.  IMPRESSION: Successful placement of intramedullary rod and screws through the proximal left femur.  Hardware appears intact, with improved alignment, though mild posterior displacement is seen.  No new fractures seen.  Original Report Authenticated By: Tonia Ghent, M.D.   Ct Head Wo Contrast  06/09/2012  *RADIOLOGY REPORT*  Clinical Data: Larey Seat.  Fracture right hip.  CT HEAD WITHOUT CONTRAST  Technique:  Contiguous axial images were obtained from the base of the skull through the vertex without contrast.  Comparison: 02/25/2010.  Findings: No skull fracture or intracranial hemorrhage.  Global atrophy without hydrocephalus.  Small vessel disease type changes without CT evidence of large acute infarct.  Vascular calcifications.  No intracranial mass lesion detected on this unenhanced exam.  IMPRESSION: No skull fracture or intracranial hemorrhage.  Original Report Authenticated By: Fuller Canada, M.D.    Scheduled Meds:    . calcium-vitamin D  2 tablet Oral BID   And  . magnesium chloride  1 tablet Oral BID  . docusate sodium  100 mg Oral BID  . enoxaparin (LOVENOX) injection  40 mg Subcutaneous Q24H  . ferrous sulfate  325 mg Oral TID PC  . pantoprazole  40 mg Oral Q1200  . scopolamine  1 patch Transdermal Q72H  . senna  1  tablet Oral BID  . sodium chloride  3 mL Intravenous Q12H  . trimethoprim  100 mg Oral Daily   Continuous Infusions:    . sodium chloride 100 mL/hr at 06/10/12 1317    [name], MD  [facility] Pager [phone]  If 7PM-7AM, please contact night-coverage [email or web address] Password [email or web address] 06/11/2012, 12:10 PM

## 2012-06-11 NOTE — Progress Notes (Signed)
Subjective: 2 Days Post-Op Procedure(s) (LRB): INTRAMEDULLARY (IM) NAIL INTERTROCHANTERIC (Left) Patient reports pain as mild.    Objective: Vital signs in last 24 hours: Temp:  [97.9 F (36.6 C)-98.7 F (37.1 C)] 98.2 F (36.8 C) (07/13 0925) Pulse Rate:  [79-97] 96  (07/13 0925) Resp:  [16-18] 18  (07/13 0925) BP: (103-126)/(52-58) 112/58 mmHg (07/13 0925) SpO2:  [97 %-98 %] 98 % (07/13 0504)  Intake/Output from previous day: 07/12 0701 - 07/13 0700 In: -  Out: 500 [Urine:500] Intake/Output this shift: Total I/O In: 120 [P.O.:120] Out: -    Basename 06/11/12 0610 06/10/12 0620 06/09/12 1602  HGB 6.8* 8.6* 11.5*    Basename 06/11/12 0610 06/10/12 0620  WBC 11.5* 9.1  RBC 2.26* 2.87*  HCT 19.7* 24.9*  PLT 202 248    Basename 06/11/12 0610 06/10/12 0620  NA 126* 129*  K 4.1 4.7  CL 93* 95*  CO2 25 25  BUN 10 12  CREATININE 0.50 0.55  GLUCOSE 120* 135*  CALCIUM 8.0* 8.1*   No results found for this basename: LABPT:2,INR:2 in the last 72 hours  Neurologically intact Dorsiflexion/Plantar flexion intact Incision: no drainage Compartment soft  Assessment/Plan: 2 Days Post-Op Procedure(s) (LRB): INTRAMEDULLARY (IM) NAIL INTERTROCHANTERIC (Left) Advance diet Up with therapy Discharge to SNF early next week  Hidaya Daniel V 06/11/2012, 11:03 AM

## 2012-06-11 NOTE — Progress Notes (Signed)
Physical Therapy Treatment Patient Details Name: [name] MRN: [id number] DOB: 04/05/1926 Today's Date: 06/11/2012 Time: 6295-2841 PT Time Calculation (min): 43 min  PT Assessment / Plan / Recommendation Comments on Treatment Session  Pt continues to be limited by fear and pain.  Needs constant cues, assist and encouragement through out limited treatment.  Agree with need for SNF upon d/c.  Pt with decreased Hgb this am and received blood.    Follow Up Recommendations  Skilled nursing facility;Supervision/Assistance - 24 hour    Barriers to Discharge        Equipment Recommendations  Defer to next venue    Recommendations for Other Services    Frequency Min 5X/week   Plan Discharge plan remains appropriate;Frequency remains appropriate    Precautions / Restrictions Precautions Precautions: Fall Restrictions Weight Bearing Restrictions: Yes LLE Weight Bearing: Weight bearing as tolerated   Pertinent Vitals/Pain Premedicated-10/10 L hip with mobility.    Mobility  Bed Mobility Bed Mobility: Not assessed Transfers Transfers: Sit to Stand;Stand to Sit;Stand Pivot Transfers Sit to Stand: 1: +2 Total assist;With upper extremity assist;With armrests;From chair/3-in-1 Sit to Stand: Patient Percentage: 50% Stand to Sit: 1: +2 Total assist;Without upper extremity assist;To chair/3-in-1 Stand to Sit: Patient Percentage: 50% Stand Pivot Transfers: 1: +2 Total assist Stand Pivot Transfers: Patient Percentage: 50% Details for Transfer Assistance: Increased anxiety.  Constant cues for technique and safety as well as significant physical assist.  Leans posteriorly with standing.  Cues for upright posture.  Needs assist to advance L LE. Ambulation/Gait Ambulation/Gait Assistance: Not tested (comment) (Pt unable)    Exercises General Exercises - Lower Extremity Ankle Circles/Pumps: AROM;Both;10 reps;Seated Quad Sets: AROM;Left;10 reps;Supine Long Arc Quad: Left;AROM;10  reps;Seated  PT Goals Acute Rehab PT Goals Time For Goal Achievement: 06/24/12 Potential to Achieve Goals: Good PT Goal: Sit to Stand - Progress: Progressing toward goal PT Transfer Goal: Bed to Chair/Chair to Bed - Progress: Progressing toward goal  Visit Information  Last PT Received On: 06/11/12 Assistance Needed: +2    Subjective Data  Subjective: "I just don't think I can do it." LK:[id number]   Cognition  Overall Cognitive Status: Appears within functional limits for tasks assessed/performed Arousal/Alertness: Awake/alert Orientation Level: Appears intact for tasks assessed Behavior During Session: [facility] for tasks performed Cognition - Other Comments: Daughter reports pt has been having some confusion today with decreased orientation.         End of Session PT - End of Session Equipment Utilized During Treatment: Gait belt Activity Tolerance: Patient limited by pain Patient left: in chair;with call bell/phone within reach;with family/visitor present Nurse Communication: Mobility status   Newell Coral 06/11/2012, 2:26 PM  Newell Coral, PTA Acute Rehab [phone] (office)

## 2012-06-12 LAB — PREPARE RBC (CROSSMATCH)

## 2012-06-12 LAB — BASIC METABOLIC PANEL
BUN: 9 mg/dL (ref 6–23)
CO2: 27 mEq/L (ref 19–32)
CO2: 26 mEq/L (ref 19–32)
Chloride: 94 mEq/L — ABNORMAL LOW (ref 96–112)
Chloride: 92 mEq/L — ABNORMAL LOW (ref 96–112)
GFR calc Af Amer: >90 mL/min (ref >90)
GFR calc non Af Amer: 87 mL/min — ABNORMAL LOW (ref >90)
Glucose, Bld: 110 mg/dL — ABNORMAL HIGH (ref 70–99)
Glucose, Bld: 116 mg/dL — ABNORMAL HIGH (ref 70–99)
Potassium: 4.3 mEq/L (ref 3.5–5.1)
Potassium: 4 mEq/L (ref 3.5–5.1)
Sodium: 128 mEq/L — ABNORMAL LOW (ref 135–145)

## 2012-06-12 LAB — CBC
HCT: 19.2 % — ABNORMAL LOW (ref 36.0–46.0)
HCT: 20.8 % — ABNORMAL LOW (ref 36.0–46.0)
Hemoglobin: 6.7 g/dL — CL (ref 12.0–15.0)
Hemoglobin: 7.2 g/dL — ABNORMAL LOW (ref 12.0–15.0)
MCH: 30.3 pg (ref 26.0–34.0)
MCV: 87.7 fL (ref 78.0–100.0)
RBC: 2.19 MIL/uL — ABNORMAL LOW (ref 3.87–5.11)
RBC: 2.38 MIL/uL — ABNORMAL LOW (ref 3.87–5.11)
WBC: 10.4 10*3/uL (ref 4.0–10.5)

## 2012-06-12 LAB — OSMOLALITY, URINE: Osmolality, Ur: 343 mOsm/kg — ABNORMAL LOW (ref 390–1090)

## 2012-06-12 MED ORDER — ACETAMINOPHEN 325 MG PO TABS
650.0000 mg | ORAL_TABLET | Freq: Once | ORAL | Status: AC
  Administered 2012-06-12: 650 mg via ORAL
  Filled 2012-06-12: qty 2

## 2012-06-12 MED ORDER — DIPHENHYDRAMINE HCL 25 MG PO CAPS
25.0000 mg | ORAL_CAPSULE | Freq: Once | ORAL | Status: AC
  Administered 2012-06-12: 25 mg via ORAL
  Filled 2012-06-12: qty 1

## 2012-06-12 NOTE — Progress Notes (Signed)
Subjective: 3 Days Post-Op Procedure(s) (LRB): INTRAMEDULLARY (IM) NAIL INTERTROCHANTERIC (Left) Patient reports pain as mild.    Objective: Vital signs in last 24 hours: Temp:  [97.5 F (36.4 C)-98.9 F (37.2 C)] 97.5 F (36.4 C) (07/14 0520) Pulse Rate:  [90-97] 90  (07/14 0520) Resp:  [16-18] 16  (07/14 0520) BP: (107-128)/(51-67) 116/67 mmHg (07/14 0520) SpO2:  [93 %-99 %] 96 % (07/14 0520)  Intake/Output from previous day: 07/13 0701 - 07/14 0700 In: 600 [P.O.:600] Out: -  Intake/Output this shift:     Basename 06/12/12 0600 06/11/12 0610 06/10/12 0620 06/09/12 1602  HGB 6.7* 6.8* 8.6* 11.5*    Basename 06/12/12 0600 06/11/12 0610  WBC 10.4 11.5*  RBC 2.19* 2.26*  HCT 19.2* 19.7*  PLT 200 202    Basename 06/11/12 0610 06/10/12 0620  NA 126* 129*  K 4.1 4.7  CL 93* 95*  CO2 25 25  BUN 10 12  CREATININE 0.50 0.55  GLUCOSE 120* 135*  CALCIUM 8.0* 8.1*   No results found for this basename: LABPT:2,INR:2 in the last 72 hours  Neurologically intact Intact pulses distally Incision: dressing C/D/I No cellulitis present Compartment soft No significant swelling in thigh  Assessment/Plan: 3 Days Post-Op Procedure(s) (LRB): INTRAMEDULLARY (IM) NAIL INTERTROCHANTERIC (Left) Up with therapy Transfuse 2 more units PRBCs for post-op blood loss anemia. Hb did not improve despite 2 units yesterday. I do not feel that she has any active bleeding in her thigh as it is not excessively swollen. Does not need any further imaging of thigh. Will transfuse 2 additional units of PRBCs today and recheck Hb in AM.  Ollen Gross V 06/12/2012, 7:27 AM

## 2012-06-12 NOTE — Progress Notes (Signed)
PT Cancellation Note  Treatment cancelled today due to medical issues with patient which prohibited therapy. Hgb < 7.0 following transfusion previous date, 2 units pending today. Will follow up tomorrow and continue mobility retraining as pt able to medically tolerate.  Narda [facility] 06/12/2012, 10:23 AM

## 2012-06-13 DIAGNOSIS — S7290XA Unspecified fracture of unspecified femur, initial encounter for closed fracture: Secondary | ICD-10-CM | POA: Diagnosis not present

## 2012-06-13 DIAGNOSIS — S72009A Fracture of unspecified part of neck of unspecified femur, initial encounter for closed fracture: Secondary | ICD-10-CM | POA: Diagnosis not present

## 2012-06-13 DIAGNOSIS — I059 Rheumatic mitral valve disease, unspecified: Secondary | ICD-10-CM | POA: Diagnosis not present

## 2012-06-13 DIAGNOSIS — Q233 Congenital mitral insufficiency: Secondary | ICD-10-CM | POA: Diagnosis not present

## 2012-06-13 DIAGNOSIS — E785 Hyperlipidemia, unspecified: Secondary | ICD-10-CM | POA: Diagnosis not present

## 2012-06-13 DIAGNOSIS — K219 Gastro-esophageal reflux disease without esophagitis: Secondary | ICD-10-CM | POA: Diagnosis not present

## 2012-06-13 DIAGNOSIS — H3530 Unspecified macular degeneration: Secondary | ICD-10-CM | POA: Diagnosis present

## 2012-06-13 DIAGNOSIS — I10 Essential (primary) hypertension: Secondary | ICD-10-CM | POA: Diagnosis present

## 2012-06-13 DIAGNOSIS — M549 Dorsalgia, unspecified: Secondary | ICD-10-CM | POA: Diagnosis not present

## 2012-06-13 DIAGNOSIS — M25559 Pain in unspecified hip: Secondary | ICD-10-CM | POA: Diagnosis not present

## 2012-06-13 DIAGNOSIS — K5900 Constipation, unspecified: Secondary | ICD-10-CM | POA: Diagnosis not present

## 2012-06-13 DIAGNOSIS — Z79899 Other long term (current) drug therapy: Secondary | ICD-10-CM | POA: Diagnosis not present

## 2012-06-13 DIAGNOSIS — T84498A Other mechanical complication of other internal orthopedic devices, implants and grafts, initial encounter: Secondary | ICD-10-CM | POA: Diagnosis not present

## 2012-06-13 DIAGNOSIS — S72143A Displaced intertrochanteric fracture of unspecified femur, initial encounter for closed fracture: Secondary | ICD-10-CM | POA: Diagnosis not present

## 2012-06-13 DIAGNOSIS — Z87891 Personal history of nicotine dependence: Secondary | ICD-10-CM | POA: Diagnosis not present

## 2012-06-13 DIAGNOSIS — Z96659 Presence of unspecified artificial knee joint: Secondary | ICD-10-CM | POA: Diagnosis not present

## 2012-06-13 DIAGNOSIS — S72009D Fracture of unspecified part of neck of unspecified femur, subsequent encounter for closed fracture with routine healing: Secondary | ICD-10-CM | POA: Diagnosis not present

## 2012-06-13 DIAGNOSIS — S73006A Unspecified dislocation of unspecified hip, initial encounter: Secondary | ICD-10-CM | POA: Diagnosis not present

## 2012-06-13 DIAGNOSIS — S79919A Unspecified injury of unspecified hip, initial encounter: Secondary | ICD-10-CM | POA: Diagnosis not present

## 2012-06-13 DIAGNOSIS — D72829 Elevated white blood cell count, unspecified: Secondary | ICD-10-CM | POA: Diagnosis not present

## 2012-06-13 DIAGNOSIS — Z5189 Encounter for other specified aftercare: Secondary | ICD-10-CM | POA: Diagnosis not present

## 2012-06-13 DIAGNOSIS — E871 Hypo-osmolality and hyponatremia: Secondary | ICD-10-CM | POA: Diagnosis not present

## 2012-06-13 DIAGNOSIS — D62 Acute posthemorrhagic anemia: Secondary | ICD-10-CM | POA: Diagnosis not present

## 2012-06-13 DIAGNOSIS — T84049A Periprosthetic fracture around unspecified internal prosthetic joint, initial encounter: Secondary | ICD-10-CM | POA: Diagnosis not present

## 2012-06-13 LAB — TYPE AND SCREEN
Unit division: 0
Unit division: 0

## 2012-06-13 LAB — CBC
HCT: 26.7 % — ABNORMAL LOW (ref 36.0–46.0)
Hemoglobin: 9.4 g/dL — ABNORMAL LOW (ref 12.0–15.0)
MCHC: 35.2 g/dL (ref 30.0–36.0)
MCV: 87.5 fL (ref 78.0–100.0)

## 2012-06-13 MED ORDER — POLYETHYLENE GLYCOL 3350 17 G PO PACK: 17.0000 g | PACK | Freq: Every day | ORAL | Status: AC | PRN

## 2012-06-13 MED ORDER — OLMESARTAN MEDOXOMIL 20 MG PO TABS: 20.0000 mg | ORAL_TABLET | Freq: Every day | ORAL | Status: DC

## 2012-06-13 MED ORDER — SODIUM CHLORIDE 0.9 % IV BOLUS (SEPSIS)
500.0000 mL | Freq: Once | INTRAVENOUS | Status: AC
  Administered 2012-06-13: 500 mL via INTRAVENOUS

## 2012-06-13 MED ORDER — OXYCODONE-ACETAMINOPHEN 5-325 MG PO TABS: 1.0000 | ORAL_TABLET | ORAL | Status: AC | PRN

## 2012-06-13 MED ORDER — FERROUS SULFATE 325 (65 FE) MG PO TABS: 325.0000 mg | ORAL_TABLET | Freq: Three times a day (TID) | ORAL | Status: DC

## 2012-06-13 MED ORDER — WHITE PETROLATUM GEL
Status: AC
  Administered 2012-06-13: 01:00:00
  Filled 2012-06-13: qty 5

## 2012-06-13 NOTE — Progress Notes (Signed)
Subjective: 4 Days Post-Op Procedure(s) (LRB): INTRAMEDULLARY (IM) NAIL INTERTROCHANTERIC (Left) Patient reports pain as mild.  She has struggled with PT so far.  No SOB /CP.  2 u PRBCs yesterday.  Objective: Vital signs in last 24 hours: Temp:  [98.1 F (36.7 C)-98.9 F (37.2 C)] 98.1 F (36.7 C) (07/15 0625) Pulse Rate:  [73-88] 73  (07/15 0625) Resp:  [16-18] 16  (07/15 0625) BP: (116-130)/(55-65) 116/60 mmHg (07/15 0625) SpO2:  [93 %-98 %] 93 % (07/15 0625)  Intake/Output from previous day:   Intake/Output this shift:     Basename 06/13/12 0500 06/12/12 0914 06/12/12 0600 06/11/12 0610  HGB 9.4* 7.2* 6.7* 6.8*    Basename 06/13/12 0500 06/12/12 0914  WBC 9.1 11.3*  RBC 3.05* 2.38*  HCT 26.7* 20.8*  PLT 221 220    Basename 06/12/12 0914 06/12/12 0600  NA 128* 128*  K 4.0 4.3  CL 92* 94*  CO2 26 27  BUN 9 9  CREATININE 0.49* 0.52  GLUCOSE 116* 110*  CALCIUM 8.4 8.1*   No results found for this basename: LABPT:2,INR:2 in the last 72 hours  L thigh wounds healing well.  No sigsn of infection.  NVI at L foot.  Assessment/Plan: 4 Days Post-Op Procedure(s) (LRB): INTRAMEDULLARY (IM) NAIL INTERTROCHANTERIC (Left) Up with therapy  Pt is WBAT on L LE.  She'll need to f/u with me in two weeks in clinic.  F/u info in d/c section of epic.  She will need two weeks of lovenox 40 mg Robertson qday post op and should then switch to aspirin 325 mg po bid for a month.  I'll continue to follow while she's in house.  Toni Arthurs 06/13/2012, 7:47 AM

## 2012-06-13 NOTE — Progress Notes (Signed)
Physical Therapy Treatment Patient Details Name: [name] MRN: [id number] DOB: February 24, 1926 Today's Date: 06/13/2012 Time: 4098-1191 PT Time Calculation (min): 29 min  PT Assessment / Plan / Recommendation Comments on Treatment Session  Pt able to make progress and ambulate this session with max cues for proper sequence.  Continue to agree with SNF upon d/c.    Follow Up Recommendations  Skilled nursing facility;Supervision/Assistance - 24 hour    Barriers to Discharge        Equipment Recommendations  Defer to next venue    Recommendations for Other Services    Frequency Min 5X/week   Plan Discharge plan remains appropriate;Frequency remains appropriate    Precautions / Restrictions Precautions Precautions: Fall Restrictions Weight Bearing Restrictions: Yes LLE Weight Bearing: Weight bearing as tolerated   Pertinent Vitals/Pain 3/10 left LE pain    Mobility  Bed Mobility Bed Mobility: Supine to Sit Supine to Sit: 1: +2 Total assist;With rails;HOB elevated Supine to Sit: Patient Percentage: 40% Details for Bed Mobility Assistance: Max cues for proper technique and (A) with trunk and LE OOB Transfers Transfers: Sit to Stand;Stand to Sit;Stand Pivot Transfers Sit to Stand: 1: +2 Total assist;With upper extremity assist;From chair/3-in-1 Sit to Stand: Patient Percentage: 60% Stand to Sit: With upper extremity assist;To chair/3-in-1 Stand to Sit: Patient Percentage: 60% Details for Transfer Assistance: VCs for proper technique with hand placement and LE placement.   Ambulation/Gait Ambulation/Gait Assistance: 1: +2 Total assist Ambulation/Gait: Patient Percentage: 60% Ambulation Distance (Feet): 8 Feet Assistive device: Rolling walker Ambulation/Gait Assistance Details: (A) to maintain balance with max cues for RW placement and management, VCs for proper step sequence and LE placement. Gait Pattern: Step-to pattern;Decreased stance time - right;Decreased stride  length;Shuffle;Antalgic;Trunk flexed    Exercises General Exercises - Lower Extremity Ankle Circles/Pumps: AROM;Both;10 reps;Seated Quad Sets: AROM;Left;10 reps;Supine Short Arc Quad: Strengthening;Left;10 reps   PT Diagnosis:    PT Problem List:   PT Treatment Interventions:     PT Goals Acute Rehab PT Goals PT Goal Formulation: With patient Time For Goal Achievement: 06/24/12 Potential to Achieve Goals: Good Pt will go Supine/Side to Sit: with supervision;with HOB 0 degrees PT Goal: Supine/Side to Sit - Progress: Progressing toward goal Pt will go Sit to Stand: with supervision;with upper extremity assist PT Goal: Sit to Stand - Progress: Progressing toward goal Pt will Transfer Bed to Chair/Chair to Bed: with supervision PT Transfer Goal: Bed to Chair/Chair to Bed - Progress: Progressing toward goal Pt will Ambulate: 16 - 50 feet;with supervision;with rolling walker PT Goal: Ambulate - Progress: Progressing toward goal  Visit Information  Last PT Received On: 06/13/12 Assistance Needed: +2    Subjective Data      Cognition  Overall Cognitive Status: Appears within functional limits for tasks assessed/performed Arousal/Alertness: Awake/alert Orientation Level: Appears intact for tasks assessed Behavior During Session: [facility] for tasks performed    Balance  Balance Balance Assessed: Yes Static Sitting Balance Static Sitting - Balance Support: Feet supported Static Sitting - Level of Assistance: 5: Stand by assistance  End of Session PT - End of Session Equipment Utilized During Treatment: Gait belt Activity Tolerance: Patient limited by pain Patient left: in chair;with call bell/phone within reach;with family/visitor present Nurse Communication: Mobility status   GP     [name] 06/13/2012, 10:40 AM Jake Shark, PT DPT [phone]

## 2012-06-13 NOTE — Discharge Summary (Signed)
Physician Discharge Summary  [name]:[id number] DOB: 1925/12/25 DOA: 06/09/2012  PCP: Londell Moh, MD  Admit date: 06/09/2012 Discharge date: 06/13/2012  Discharge Diagnoses:  Principal Problem:  *Hyponatremia Active Problems:  GERD  BACK PAIN, CHRONIC  Hip fx  Leukocytosis   Discharge Condition: Stable ford/c  Disposition:  Follow-up Information    Follow up with Toni Arthurs, MD. Schedule an appointment as soon as possible for a visit in 2 weeks. (hospital follow up)    Contact information:   [address], [address] [place] [address] [phone]          Diet: Heart healthy diet  History of present illness:  78 year old female with past medical history of hyperlipidemia call mitral regurgitation and chronic constipation. She was she was walking out of the store when she slept to the floor. She was she did not lose consciousness: There was no loss of control sphincters, no loss of consciousness. She relates no trauma to the head appeared no prodromal symptoms.  After fall she started having pain in her left hip. Not able to move secondary to pain she noticed that her leg was externally rotated and shorter than the right.   Hospital Course:  Principal Problem:  *Hyponatremia: After surgery her sodium started to trend down as she was not eating and drinking. Her fractional excretion of sodium was done which was less than 1%. Her blood pressure was soft and her urine output was minimal. Her blood pressure medications were held she was given a bolus of normal saline. She had complained of some dizziness on the day of discharge upon standing. After the bolus of normal saline was given does resolve. We'll hold her Avapro for 3 or 4 more days and she will start this as an outpatient.   BACK PAIN, CHRONIC No changes were made she was continue narcotics.   Hip fx She was admitted to the hospital preop evaluation the her low to moderate  risk for any cardiopulmonary complications. This was discussed with the family orthopedic was consulted they recommended intramedullary nailing intertrochanteric on the left femur. After surgery her hemoglobin came down she she was transfused to packs of red blood cell was in her hemoglobin got was 6.7 after transfusion was 9.4. She tolerated this well physical therapy was consulted they recommended skilled nursing facility. The patient agreed. Discharge Exam: Filed Vitals:   06/13/12 0625  BP: 116/60  Pulse: 73  Temp: 98.1 F (36.7 C)  Resp: 16   Filed Vitals:   06/12/12 1300 06/12/12 1600 06/12/12 2045 06/13/12 0625  BP: 120/65  130/56 116/60  Pulse: 88  75 73  Temp: 98.7 F (37.1 C)  98.4 F (36.9 C) 98.1 F (36.7 C)  TempSrc:   Oral Oral  Resp: 16 18 16 16   SpO2:  98% 96% 93%   General: Awake alert and oriented times Cardiovascular: Regular rate and rhythm with positive S1 and S2 Respiratory: Good air movement clear to  Discharge Instructions  Discharge Orders    Future Orders Please Complete By Expires   Diet - low sodium heart healthy      Increase activity slowly        Medication List  As of 06/13/2012  9:54 AM   TAKE these medications         BENEFIBER DRINK MIX PO   Take by mouth. 2 tsp two times a day      cetirizine 10 MG tablet   Commonly known as:  ZYRTEC   Take 10 mg by mouth daily.      cholecalciferol 1000 UNITS tablet   Commonly known as: VITAMIN D   Take 1,000 Units by mouth daily.      CITRACAL CALCIUM+D 600-40-500 MG-MG-UNIT Tb24   Generic drug: Calcium-Magnesium-Vitamin D   Take 2 capsules by mouth 2 (two) times daily.      ferrous sulfate 325 (65 FE) MG tablet   Take 1 tablet (325 mg total) by mouth 3 (three) times daily with meals.      fish oil-omega-3 fatty acids 1000 MG capsule   Take 1 g by mouth daily.      HYDROcodone-acetaminophen 7.5-500 MG per tablet   Commonly known as: LORTAB   1 tablet. 1 tab four times daily       LUCENTIS 0.5 MG/0.05ML Soln   Generic drug: Ranibizumab   Inject into the eye. Every 6 weeks        Magnesium 250 MG Tabs   Take 1 tablet by mouth.      multivitamin capsule   Take 1 capsule by mouth daily.      olmesartan 20 MG tablet   Commonly known as: BENICAR   Take 1 tablet (20 mg total) by mouth daily.   Start taking on: 06/16/2012      omeprazole 20 MG capsule   Commonly known as: PRILOSEC   Take 20 mg by mouth daily.      oxyCODONE-acetaminophen 5-325 MG per tablet   Commonly known as: PERCOCET   Take 1-2 tablets by mouth every 4 (four) hours as needed.      polyethylene glycol packet   Commonly known as: MIRALAX / GLYCOLAX   Take 17 g by mouth daily as needed.      VISION-VITE PRESERVE PO   Take by mouth.      PRESERVISION AREDS 2 PO   Take 1 tablet by mouth 2 (two) times daily.      trimethoprim 100 MG tablet   Commonly known as: TRIMPEX   Take 100 mg by mouth daily.      vitamin B-12 500 MCG tablet   Commonly known as: CYANOCOBALAMIN   Take 500 mcg by mouth daily.              The results of significant diagnostics from this hospitalization (including imaging, microbiology, ancillary and laboratory) are listed below for reference.    Significant Diagnostic Studies: Dg Chest 1 View  06/09/2012  *RADIOLOGY REPORT*  Clinical Data: Recent fall, hip fracture  CHEST - 1 VIEW  Comparison: Chest x-ray of 09/22/2007  Findings: No active infiltrate or effusion is seen. There is cardiomegaly present.  There is a large retrocardiac density present most consistent with a large hiatal hernia which has increased in size in the intervening years.  There is a thoracolumbar scoliosis present with the bones being diffusely osteopenic.  Degenerative change is noted in the right shoulder as well.  IMPRESSION:  1.  No active lung disease. 2.  Moderate to large hiatal hernia. 3.  Cardiomegaly.  Original Report Authenticated By: Juline Patch, M.D.   Dg Hip Complete  Left  06/09/2012  *RADIOLOGY REPORT*  Clinical Data: Fall and left hip pain.  LEFT HIP - COMPLETE 2+ VIEW  Comparison: Acute abdominal series 02/25/2010  Findings: AP view of the pelvis and two views of the left hip demonstrate a displaced and mildly comminuted fracture of the left proximal femur.  There is an intertrochanteric component to  the fracture but there may also be a fracture involving the femoral neck.  The lateral view is limited but the left hip appears to be located.  Marked degenerative changes at the pubic symphysis. Evidence for scoliosis in the lower lumbar spine and diffuse osteopenia.  Pelvic bony ring appears to be intact.  IMPRESSION: Displaced and mildly comminuted fracture of the proximal left femur as described.  Original Report Authenticated By: Richarda Overlie, M.D.   Dg Hip Operative Left  06/09/2012  *RADIOLOGY REPORT*  Clinical Data: Left-sided intramedullary nail placement.  OPERATIVE LEFT HIP  Comparison: Left hip radiographs performed earlier today at 02:55 p.m.  Findings: There has been interval placement of an intramedullary rod and screws through the proximal left femur.  The hardware appears intact.  Alignment is improved, though there is persistent posterior displacement at the fracture site.  No new fractures are seen.  The soft tissues are difficult to fully assess.  A total of four fluoroscopic C-arm images are provided for evaluation.  IMPRESSION: Successful placement of intramedullary rod and screws through the proximal left femur.  Hardware appears intact, with improved alignment, though mild posterior displacement is seen.  No new fractures seen.  Original Report Authenticated By: Tonia Ghent, M.D.   Ct Head Wo Contrast  06/09/2012  *RADIOLOGY REPORT*  Clinical Data: Larey Seat.  Fracture right hip.  CT HEAD WITHOUT CONTRAST  Technique:  Contiguous axial images were obtained from the base of the skull through the vertex without contrast.  Comparison: 02/25/2010.  Findings:  No skull fracture or intracranial hemorrhage.  Global atrophy without hydrocephalus.  Small vessel disease type changes without CT evidence of large acute infarct.  Vascular calcifications.  No intracranial mass lesion detected on this unenhanced exam.  IMPRESSION: No skull fracture or intracranial hemorrhage.  Original Report Authenticated By: Fuller Canada, M.D.   Dg Chest Port 1 View  06/11/2012  *RADIOLOGY REPORT*  Clinical Data: Rule out aspiration.  Gastroesophageal reflux disease.  PORTABLE CHEST - 1 VIEW  Comparison: 06/09/2012  Findings: Mild hyperinflation. Midline trachea.  Mild S-shaped thoracolumbar spine curvature.  Mild cardiomegaly with atherosclerosis in the transverse aorta.  Moderate to large hiatal hernia. No pleural effusion or pneumothorax.  No congestive failure.  There is slight increased retrocardiac density when compared to the prior exam.  The right lung is clear.  IMPRESSION: Moderate to large hiatal hernia.  Increased density within the adjacent left lower lobe.  This could represent atelectasis/volume loss.  However, early infection or aspiration could look similar. Consider short-term radiographic follow-up.  Original Report Authenticated By: Consuello Bossier, M.D.    Microbiology: No results found for this or any previous visit (from the past 240 hour(s)).   Labs: Basic Metabolic Panel:  Lab 06/12/12 3086 06/12/12 0600 06/11/12 0610 06/10/12 0620 06/09/12 1602  NA 128* 128* 126* 129* 133*  K 4.0 4.3 -- -- --  CL 92* 94* 93* 95* 97  CO2 26 27 25 25 25   GLUCOSE 116* 110* 120* 135* 140*  BUN 9 9 10 12 12   CREATININE 0.49* 0.52 0.50 0.55 0.58  CALCIUM 8.4 8.1* 8.0* 8.1* 9.1  MG -- -- -- -- --  PHOS -- -- -- -- --   Liver Function Tests:  Lab 06/10/12 0620 06/09/12 1602  AST 21 20  ALT 9 11  ALKPHOS 52 69  BILITOT 0.5 0.3  PROT 5.4* 6.8  ALBUMIN 3.0* 3.9   No results found for this basename: LIPASE:5,AMYLASE:5 in the last 168  hours No results found for  this basename: AMMONIA:5 in the last 168 hours CBC:  Lab 06/13/12 0500 06/12/12 0914 06/12/12 0600 06/11/12 0610 06/10/12 0620 06/09/12 1602  WBC 9.1 11.3* 10.4 11.5* 9.1 --  NEUTROABS -- -- -- -- -- 14.4*  HGB 9.4* 7.2* 6.7* 6.8* 8.6* --  HCT 26.7* 20.8* 19.2* 19.7* 24.9* --  MCV 87.5 87.4 87.7 87.2 86.8 --  PLT 221 220 200 202 248 --   Cardiac Enzymes: No results found for this basename: CKTOTAL:5,CKMB:5,CKMBINDEX:5,TROPONINI:5 in the last 168 hours BNP: No components found with this basename: POCBNP:5 CBG: No results found for this basename: GLUCAP:5 in the last 168 hours  Time coordinating discharge: Greater than 45 minutes  Signed:  Marinda Elk  [facility] 06/13/2012, 9:54 AM

## 2012-06-16 DIAGNOSIS — S72009D Fracture of unspecified part of neck of unspecified femur, subsequent encounter for closed fracture with routine healing: Secondary | ICD-10-CM | POA: Diagnosis not present

## 2012-06-16 DIAGNOSIS — K219 Gastro-esophageal reflux disease without esophagitis: Secondary | ICD-10-CM | POA: Diagnosis not present

## 2012-06-16 DIAGNOSIS — M549 Dorsalgia, unspecified: Secondary | ICD-10-CM | POA: Diagnosis not present

## 2012-06-16 DIAGNOSIS — D72829 Elevated white blood cell count, unspecified: Secondary | ICD-10-CM | POA: Diagnosis not present

## 2012-06-17 DIAGNOSIS — S72143A Displaced intertrochanteric fracture of unspecified femur, initial encounter for closed fracture: Secondary | ICD-10-CM | POA: Diagnosis not present

## 2012-06-18 DIAGNOSIS — K5900 Constipation, unspecified: Secondary | ICD-10-CM | POA: Diagnosis not present

## 2012-06-18 DIAGNOSIS — S72009D Fracture of unspecified part of neck of unspecified femur, subsequent encounter for closed fracture with routine healing: Secondary | ICD-10-CM | POA: Diagnosis not present

## 2012-06-24 ENCOUNTER — Encounter (HOSPITAL_COMMUNITY): Payer: Self-pay | Admitting: Pharmacy Technician

## 2012-06-24 ENCOUNTER — Encounter (HOSPITAL_COMMUNITY): Payer: Self-pay | Admitting: *Deleted

## 2012-06-24 NOTE — Progress Notes (Signed)
Received call from Donney Rankins, LPN at [facility] that she received the pre-op instructions for patient.

## 2012-06-24 NOTE — H&P (Signed)
[name] is an 78 y.o. female.    Chief Complaint:  Left hip pain , post ORIF   HPI: Pt is a 78 y.o. female complaining of left hip pain.  Patient fell from standing height on 06/09/12. She c/o dull aching pain that was mild and constant but gets worse and sharp with motion. Dr Victorino Dike preformed a IM nailing of the left hip on 06/09/12, she tolerated the procedure well. After leaving the hospital she did well at first then, but then started to have pain in the left hip. The pain increased to the point of not being able to walk on the left leg. Dr. Charlann Boxer saw the patient in the office and it was decided that the only way to fix the hip was with a different procedure. Risks, benefits and expectations were discussed with the patient. Patient understand the risks, benefits and expectations and wishes to proceed with conversion of the left IM nailing to a left total hip arthroplasty.   PCP:  Londell Moh, MD  D/C Plans:   SNF - return to [facility]  Post-op Meds:  No Rx given   Tranexamic Acid:   Not to be given  Decadron:   To be given  PMH: Past Medical History  Diagnosis Date  . Hyperlipidemia   . Mitral regurgitation     mild by echo 2003  . Allergic rhinitis   . Actinic keratosis   . Tenosynovitis     myxomatous  . Chronic constipation   . GERD (gastroesophageal reflux disease)   . Osteoarthritis   . Back pain   . Macular degeneration   . Hypertension     PSH: Past Surgical History  Procedure Date  . Synovectomy     of right long finger  . Hernial repain   . Inguinal hernia repair     laparoscopic preperitoneal repair of bilateral inguinal hernias  . Carpal tunnel release     bilateral  . Replacement total knee bilateral   . Tonisellectomy   . Tonisillectomy   . Tonsillectomy     Social History:  reports that she has quit smoking. Her smoking use included Cigarettes. She has a 2 pack-year smoking history. She does not have any smokeless tobacco history on  file. She reports that she drinks about 1.2 ounces of alcohol per week. She reports that she does not use illicit drugs.  Allergies:  Allergies  Allergen Reactions  . Aspirin   . Boniva (Ibandronic Acid)   . Morphine   . Nitrofuran Derivatives   . Penicillins   . Sulfonamide Derivatives     Medications: No current facility-administered medications for this encounter.   Current Outpatient Prescriptions  Medication Sig Dispense Refill  . cetirizine (ZYRTEC) 10 MG tablet Take 10 mg by mouth daily.       . Cholecalciferol (VITAMIN D3) 1000 UNITS tablet Take 1,000 Units by mouth daily.       . fish oil-omega-3 fatty acids 1000 MG capsule Take 1 g by mouth daily.        Marland Kitchen HYDROcodone-acetaminophen (LORTAB) 7.5-500 MG per tablet 1 tablet. 1 tab four times daily      . Magnesium 250 MG TABS Take 1 tablet by mouth daily.       . Multiple Vitamin (MULTIVITAMIN) capsule Take 1 capsule by mouth daily.       . Multiple Vitamins-Minerals (PRESERVISION AREDS 2 PO) Take 1 tablet by mouth 2 (two) times daily.       Marland Kitchen  trimethoprim (TRIMPEX) 100 MG tablet Take 100 mg by mouth daily.      . vitamin B-12 (CYANOCOBALAMIN) 500 MCG tablet Take 500 mcg by mouth daily.       . Wheat Dextrin (BENEFIBER DRINK MIX PO) Take by mouth. 2 tsp two times a day      . enoxaparin (LOVENOX) 40 MG/0.4ML injection Inject 40 mg into the skin See admin instructions. Daily for 2 weeks-stop on 06/27/12      . esomeprazole (NEXIUM) 40 MG capsule Take 40 mg by mouth daily before breakfast.      . ferrous sulfate 325 (65 FE) MG tablet Take 1 tablet (325 mg total) by mouth 3 (three) times daily with meals.      Marland Kitchen olmesartan (BENICAR) 20 MG tablet Take 1 tablet (20 mg total) by mouth daily.      Marland Kitchen oxyCODONE-acetaminophen (PERCOCET) 5-325 MG per tablet Take 1-2 tablets by mouth every 4 (four) hours as needed.  30 tablet  0  . senna-docusate (SENOKOT-S) 8.6-50 MG per tablet Take 1 tablet by mouth 2 (two) times daily.          ROS: Review of Systems  Constitutional: Positive for malaise/fatigue.  HENT: Negative.   Respiratory: Negative.   Cardiovascular: Negative.   Gastrointestinal: Positive for constipation.  Genitourinary: Negative.   Musculoskeletal: Positive for joint pain (left hip).  Skin: Negative.   Neurological: Positive for weakness.  Endo/Heme/Allergies: Negative.   Psychiatric/Behavioral: Negative.      Physical Exam: Blood pressure 116/60, pulse 73, temperature 98.1 F (36.7 C), temperature source Oral, resp. rate 18, SpO2 99.00%. Physical Exam  Constitutional: She is oriented to person, place, and time and well-developed, well-nourished, and in no distress. No distress.  HENT:  Head: Normocephalic and atraumatic.  Nose: Nose normal.  Mouth/Throat: Oropharynx is clear and moist.  Eyes: Pupils are equal, round, and reactive to light.  Neck: Neck supple. No JVD present. No tracheal deviation present. No thyromegaly present.  Cardiovascular: Normal rate, regular rhythm and intact distal pulses.   Pulmonary/Chest: Effort normal and breath sounds normal. No stridor. No respiratory distress. She has no wheezes. She has no rales. She exhibits no tenderness.  Abdominal: Soft. Bowel sounds are normal. There is no tenderness. There is no guarding.  Musculoskeletal:       Left hip: She exhibits decreased range of motion, decreased strength, tenderness, bony tenderness, deformity and laceration. She exhibits no swelling.  Lymphadenopathy:    She has no cervical adenopathy.  Neurological: She is alert and oriented to person, place, and time.  Skin: Skin is warm and dry.  Psychiatric: Affect normal.      Assessment/Plan Assessment:   Left hip pain , post ORIF    Plan: Patient will undergo a conversion of left hip IM nailing to a total hip arthroplasty on 06/28/2012 per Dr. Charlann Boxer at [facility] Risks benefits and expectation were discussed with the patient. Patient understand  risks, benefits and expectation and wishes to proceed.   Anastasio Auerbach Taiwana Willison   PAC  06/24/2012, 3:33 PM

## 2012-06-24 NOTE — Pre-Procedure Instructions (Signed)
Used Nursing home Medication list and information from [facility] to do medical history-did not talk to patient.

## 2012-06-24 NOTE — Pre-Procedure Instructions (Signed)
Spoke with daughter Malachi Bonds Alcindor will Albertina Parr [phone], shortstay and will bring healthcare poa papers with her

## 2012-06-28 ENCOUNTER — Encounter (HOSPITAL_COMMUNITY): Payer: Self-pay | Admitting: *Deleted

## 2012-06-28 ENCOUNTER — Encounter (HOSPITAL_COMMUNITY)
Admission: RE | Disposition: A | Discharge status: Skilled Nursing Facility | Payer: Self-pay | Source: Ambulatory Visit | Attending: Orthopedic Surgery

## 2012-06-28 ENCOUNTER — Ambulatory Visit (HOSPITAL_COMMUNITY): Payer: Medicare Other

## 2012-06-28 ENCOUNTER — Ambulatory Visit (HOSPITAL_COMMUNITY): Payer: Medicare Other | Admitting: *Deleted

## 2012-06-28 ENCOUNTER — Inpatient Hospital Stay (HOSPITAL_COMMUNITY)
Admission: RE | Admit: 2012-06-28 | Discharge: 2012-07-01 | DRG: 470 | Disposition: A | Discharge status: Skilled Nursing Facility | Payer: Medicare Other | Source: Ambulatory Visit | Attending: Orthopedic Surgery | Admitting: Orthopedic Surgery

## 2012-06-28 DIAGNOSIS — Z87891 Personal history of nicotine dependence: Secondary | ICD-10-CM

## 2012-06-28 DIAGNOSIS — H3530 Unspecified macular degeneration: Secondary | ICD-10-CM | POA: Diagnosis present

## 2012-06-28 DIAGNOSIS — K5900 Constipation, unspecified: Secondary | ICD-10-CM | POA: Diagnosis not present

## 2012-06-28 DIAGNOSIS — Z79899 Other long term (current) drug therapy: Secondary | ICD-10-CM | POA: Diagnosis not present

## 2012-06-28 DIAGNOSIS — S72009D Fracture of unspecified part of neck of unspecified femur, subsequent encounter for closed fracture with routine healing: Secondary | ICD-10-CM | POA: Diagnosis not present

## 2012-06-28 DIAGNOSIS — S7290XA Unspecified fracture of unspecified femur, initial encounter for closed fracture: Secondary | ICD-10-CM | POA: Diagnosis not present

## 2012-06-28 DIAGNOSIS — D62 Acute posthemorrhagic anemia: Secondary | ICD-10-CM | POA: Diagnosis not present

## 2012-06-28 DIAGNOSIS — Z96659 Presence of unspecified artificial knee joint: Secondary | ICD-10-CM | POA: Diagnosis not present

## 2012-06-28 DIAGNOSIS — S79929A Unspecified injury of unspecified thigh, initial encounter: Secondary | ICD-10-CM | POA: Diagnosis not present

## 2012-06-28 DIAGNOSIS — T84049A Periprosthetic fracture around unspecified internal prosthetic joint, initial encounter: Secondary | ICD-10-CM | POA: Diagnosis not present

## 2012-06-28 DIAGNOSIS — J309 Allergic rhinitis, unspecified: Secondary | ICD-10-CM | POA: Diagnosis not present

## 2012-06-28 DIAGNOSIS — T84498A Other mechanical complication of other internal orthopedic devices, implants and grafts, initial encounter: Principal | ICD-10-CM | POA: Diagnosis present

## 2012-06-28 DIAGNOSIS — K219 Gastro-esophageal reflux disease without esophagitis: Secondary | ICD-10-CM | POA: Diagnosis present

## 2012-06-28 DIAGNOSIS — L570 Actinic keratosis: Secondary | ICD-10-CM | POA: Diagnosis not present

## 2012-06-28 DIAGNOSIS — D649 Anemia, unspecified: Secondary | ICD-10-CM | POA: Diagnosis not present

## 2012-06-28 DIAGNOSIS — I059 Rheumatic mitral valve disease, unspecified: Secondary | ICD-10-CM | POA: Diagnosis present

## 2012-06-28 DIAGNOSIS — M659 Synovitis and tenosynovitis, unspecified: Secondary | ICD-10-CM | POA: Diagnosis not present

## 2012-06-28 DIAGNOSIS — I10 Essential (primary) hypertension: Secondary | ICD-10-CM | POA: Diagnosis present

## 2012-06-28 DIAGNOSIS — Y831 Surgical operation with implant of artificial internal device as the cause of abnormal reaction of the patient, or of later complication, without mention of misadventure at the time of the procedure: Secondary | ICD-10-CM | POA: Diagnosis present

## 2012-06-28 DIAGNOSIS — S72009A Fracture of unspecified part of neck of unspecified femur, initial encounter for closed fracture: Secondary | ICD-10-CM | POA: Diagnosis not present

## 2012-06-28 DIAGNOSIS — Z5189 Encounter for other specified aftercare: Secondary | ICD-10-CM | POA: Diagnosis not present

## 2012-06-28 DIAGNOSIS — M549 Dorsalgia, unspecified: Secondary | ICD-10-CM | POA: Diagnosis not present

## 2012-06-28 DIAGNOSIS — M1990 Unspecified osteoarthritis, unspecified site: Secondary | ICD-10-CM | POA: Diagnosis not present

## 2012-06-28 DIAGNOSIS — E785 Hyperlipidemia, unspecified: Secondary | ICD-10-CM | POA: Diagnosis present

## 2012-06-28 DIAGNOSIS — M25559 Pain in unspecified hip: Secondary | ICD-10-CM | POA: Diagnosis not present

## 2012-06-28 DIAGNOSIS — Z96649 Presence of unspecified artificial hip joint: Secondary | ICD-10-CM

## 2012-06-28 LAB — HEMOGLOBIN AND HEMATOCRIT, BLOOD: Hemoglobin: 8.6 g/dL — ABNORMAL LOW (ref 12.0–15.0)

## 2012-06-28 LAB — URINALYSIS, ROUTINE W REFLEX MICROSCOPIC
Glucose, UA: NEGATIVE mg/dL
Protein, ur: 100 mg/dL — AB
Specific Gravity, Urine: 1.016 (ref 1.005–1.030)
pH: 7 (ref 5.0–8.0)

## 2012-06-28 LAB — BASIC METABOLIC PANEL
BUN: 15 mg/dL (ref 6–23)
CO2: 26 mEq/L (ref 19–32)
Chloride: 96 mEq/L (ref 96–112)
Creatinine, Ser: 0.57 mg/dL (ref 0.50–1.10)
Glucose, Bld: 102 mg/dL — ABNORMAL HIGH (ref 70–99)

## 2012-06-28 LAB — PROTIME-INR: Prothrombin Time: 13.4 seconds (ref 11.6–15.2)

## 2012-06-28 LAB — URINE MICROSCOPIC-ADD ON

## 2012-06-28 LAB — CBC
Platelets: 596 10*3/uL — ABNORMAL HIGH (ref 150–400)
RBC: 3.94 MIL/uL (ref 3.87–5.11)
RDW: 13.8 % (ref 11.5–15.5)
WBC: 9.9 10*3/uL (ref 4.0–10.5)

## 2012-06-28 SURGERY — CONVERSION, PREVIOUS HIP SURGERY, TO TOTAL HIP ARTHROPLASTY
Anesthesia: General | Site: Hip | Laterality: Left | Wound class: Clean

## 2012-06-28 MED ORDER — ACETAMINOPHEN 10 MG/ML IV SOLN
INTRAVENOUS | Status: AC
  Filled 2012-06-28: qty 100

## 2012-06-28 MED ORDER — MENTHOL 3 MG MT LOZG
1.0000 | LOZENGE | OROMUCOSAL | Status: DC | PRN
  Filled 2012-06-28: qty 9

## 2012-06-28 MED ORDER — DEXAMETHASONE SODIUM PHOSPHATE 10 MG/ML IJ SOLN
INTRAMUSCULAR | Status: DC | PRN
  Administered 2012-06-28: 10 mg via INTRAVENOUS

## 2012-06-28 MED ORDER — LACTATED RINGERS IV BOLUS (SEPSIS)
500.0000 mL | Freq: Once | INTRAVENOUS | Status: DC
  Administered 2012-06-28: 17:00:00 via INTRAVENOUS

## 2012-06-28 MED ORDER — HYDROMORPHONE HCL PF 1 MG/ML IJ SOLN
0.2500 mg | INTRAMUSCULAR | Status: DC | PRN
  Administered 2012-06-28 (×2): 0.5 mg via INTRAVENOUS

## 2012-06-28 MED ORDER — MAGNESIUM OXIDE 400 (241.3 MG) MG PO TABS
200.0000 mg | ORAL_TABLET | Freq: Every day | ORAL | Status: DC
  Administered 2012-06-28: 200 mg via ORAL
  Filled 2012-06-28 (×3): qty 0.5

## 2012-06-28 MED ORDER — LIDOCAINE HCL (CARDIAC) 20 MG/ML IV SOLN
INTRAVENOUS | Status: DC | PRN
  Administered 2012-06-28: 80 mg via INTRAVENOUS

## 2012-06-28 MED ORDER — MAGNESIUM 250 MG PO TABS: 1.0000 | ORAL_TABLET | Freq: Every day | ORAL | Status: DC

## 2012-06-28 MED ORDER — NEOSTIGMINE METHYLSULFATE 1 MG/ML IJ SOLN
INTRAMUSCULAR | Status: DC | PRN
  Administered 2012-06-28: 3 mg via INTRAVENOUS

## 2012-06-28 MED ORDER — HYDROMORPHONE HCL PF 1 MG/ML IJ SOLN
0.5000 mg | INTRAMUSCULAR | Status: DC | PRN
  Administered 2012-06-29 (×2): 0.5 mg via INTRAVENOUS
  Filled 2012-06-28 (×2): qty 1

## 2012-06-28 MED ORDER — ONDANSETRON HCL 4 MG/2ML IJ SOLN
INTRAMUSCULAR | Status: DC | PRN
  Administered 2012-06-28: 4 mg via INTRAVENOUS

## 2012-06-28 MED ORDER — CHLORHEXIDINE GLUCONATE 4 % EX LIQD: 60.0000 mL | Freq: Once | CUTANEOUS | Status: DC

## 2012-06-28 MED ORDER — METHOCARBAMOL 500 MG PO TABS
500.0000 mg | ORAL_TABLET | Freq: Four times a day (QID) | ORAL | Status: DC | PRN
  Administered 2012-06-29 – 2012-06-30 (×2): 500 mg via ORAL
  Filled 2012-06-28 (×2): qty 1

## 2012-06-28 MED ORDER — CLINDAMYCIN PHOSPHATE 600 MG/50ML IV SOLN
600.0000 mg | Freq: Four times a day (QID) | INTRAVENOUS | Status: AC
  Administered 2012-06-28 – 2012-06-29 (×2): 600 mg via INTRAVENOUS
  Filled 2012-06-28 (×2): qty 50

## 2012-06-28 MED ORDER — METHOCARBAMOL 100 MG/ML IJ SOLN
500.0000 mg | Freq: Four times a day (QID) | INTRAVENOUS | Status: DC | PRN
  Filled 2012-06-28: qty 5

## 2012-06-28 MED ORDER — EPHEDRINE SULFATE 50 MG/ML IJ SOLN
INTRAMUSCULAR | Status: DC | PRN
  Administered 2012-06-28: 5 mg via INTRAVENOUS

## 2012-06-28 MED ORDER — CIPROFLOXACIN IN D5W 400 MG/200ML IV SOLN
INTRAVENOUS | Status: AC
  Filled 2012-06-28: qty 200

## 2012-06-28 MED ORDER — FENTANYL CITRATE 0.05 MG/ML IJ SOLN
50.0000 ug | INTRAMUSCULAR | Status: DC | PRN
  Administered 2012-06-28: 25 ug via INTRAVENOUS

## 2012-06-28 MED ORDER — CHLORHEXIDINE GLUCONATE 4 % EX LIQD
60.0000 mL | Freq: Once | CUTANEOUS | Status: DC
  Filled 2012-06-28: qty 60

## 2012-06-28 MED ORDER — PROMETHAZINE HCL 25 MG/ML IJ SOLN: 6.2500 mg | INTRAMUSCULAR | Status: DC | PRN

## 2012-06-28 MED ORDER — LACTATED RINGERS IV SOLN: INTRAVENOUS | Status: DC

## 2012-06-28 MED ORDER — SODIUM CHLORIDE 0.9 % IV SOLN
100.0000 mL/h | INTRAVENOUS | Status: DC
  Administered 2012-06-28 – 2012-07-01 (×6): 100 mL/h via INTRAVENOUS
  Filled 2012-06-28 (×16): qty 1000

## 2012-06-28 MED ORDER — FENTANYL CITRATE 0.05 MG/ML IJ SOLN
INTRAMUSCULAR | Status: DC | PRN
  Administered 2012-06-28 (×3): 50 ug via INTRAVENOUS
  Administered 2012-06-28: 100 ug via INTRAVENOUS

## 2012-06-28 MED ORDER — HYDROMORPHONE HCL PF 1 MG/ML IJ SOLN
INTRAMUSCULAR | Status: AC
  Filled 2012-06-28: qty 1

## 2012-06-28 MED ORDER — METOCLOPRAMIDE HCL 5 MG/ML IJ SOLN
5.0000 mg | Freq: Three times a day (TID) | INTRAMUSCULAR | Status: DC | PRN
  Administered 2012-06-29: 10 mg via INTRAVENOUS
  Filled 2012-06-28: qty 2

## 2012-06-28 MED ORDER — BISACODYL 5 MG PO TBEC
5.0000 mg | DELAYED_RELEASE_TABLET | Freq: Every day | ORAL | Status: DC | PRN
  Filled 2012-06-28: qty 1

## 2012-06-28 MED ORDER — METOCLOPRAMIDE HCL 5 MG PO TABS
5.0000 mg | ORAL_TABLET | Freq: Three times a day (TID) | ORAL | Status: DC | PRN
  Filled 2012-06-28: qty 2

## 2012-06-28 MED ORDER — ONDANSETRON HCL 4 MG PO TABS
4.0000 mg | ORAL_TABLET | Freq: Four times a day (QID) | ORAL | Status: DC | PRN
  Filled 2012-06-28: qty 1

## 2012-06-28 MED ORDER — MIDAZOLAM HCL 2 MG/2ML IJ SOLN
1.0000 mg | INTRAMUSCULAR | Status: DC | PRN
  Filled 2012-06-28: qty 2

## 2012-06-28 MED ORDER — PHENYLEPHRINE HCL 10 MG/ML IJ SOLN
INTRAMUSCULAR | Status: DC | PRN
  Administered 2012-06-28: 80 ug via INTRAVENOUS
  Administered 2012-06-28: 40 ug via INTRAVENOUS

## 2012-06-28 MED ORDER — PROPOFOL 10 MG/ML IV BOLUS
INTRAVENOUS | Status: DC | PRN
  Administered 2012-06-28: 160 mg via INTRAVENOUS

## 2012-06-28 MED ORDER — 0.9 % SODIUM CHLORIDE (POUR BTL) OPTIME
TOPICAL | Status: DC | PRN
  Administered 2012-06-28: 2000 mL

## 2012-06-28 MED ORDER — CLINDAMYCIN PHOSPHATE 900 MG/50ML IV SOLN
INTRAVENOUS | Status: AC
  Filled 2012-06-28: qty 50

## 2012-06-28 MED ORDER — RIVAROXABAN 10 MG PO TABS
10.0000 mg | ORAL_TABLET | ORAL | Status: DC
  Administered 2012-06-29 – 2012-07-01 (×3): 10 mg via ORAL
  Filled 2012-06-28 (×4): qty 1

## 2012-06-28 MED ORDER — FENTANYL CITRATE 0.05 MG/ML IJ SOLN
INTRAMUSCULAR | Status: AC
  Filled 2012-06-28: qty 2

## 2012-06-28 MED ORDER — DOCUSATE SODIUM 100 MG PO CAPS
100.0000 mg | ORAL_CAPSULE | Freq: Two times a day (BID) | ORAL | Status: DC
  Administered 2012-06-28 – 2012-07-01 (×4): 100 mg via ORAL
  Filled 2012-06-28 (×7): qty 1

## 2012-06-28 MED ORDER — FLEET ENEMA 7-19 GM/118ML RE ENEM
1.0000 | ENEMA | Freq: Once | RECTAL | Status: AC | PRN
  Filled 2012-06-28: qty 1

## 2012-06-28 MED ORDER — IRBESARTAN 150 MG PO TABS
150.0000 mg | ORAL_TABLET | Freq: Every day | ORAL | Status: DC
  Administered 2012-06-28: 150 mg via ORAL
  Filled 2012-06-28 (×3): qty 1

## 2012-06-28 MED ORDER — OXYCODONE HCL 5 MG PO TABS
5.0000 mg | ORAL_TABLET | ORAL | Status: DC
  Administered 2012-06-28 – 2012-06-30 (×5): 5 mg via ORAL
  Administered 2012-06-30: 10 mg via ORAL
  Administered 2012-06-30: 5 mg via ORAL
  Administered 2012-06-30: 10 mg via ORAL
  Administered 2012-06-30: 5 mg via ORAL
  Administered 2012-06-30: 10 mg via ORAL
  Administered 2012-07-01 (×3): 5 mg via ORAL
  Filled 2012-06-28: qty 1
  Filled 2012-06-28 (×2): qty 2
  Filled 2012-06-28 (×6): qty 1
  Filled 2012-06-28: qty 2
  Filled 2012-06-28 (×5): qty 1
  Filled 2012-06-28: qty 2

## 2012-06-28 MED ORDER — DIPHENHYDRAMINE HCL 25 MG PO CAPS
25.0000 mg | ORAL_CAPSULE | Freq: Four times a day (QID) | ORAL | Status: DC | PRN
  Filled 2012-06-28: qty 1

## 2012-06-28 MED ORDER — POLYETHYLENE GLYCOL 3350 17 G PO PACK
17.0000 g | PACK | Freq: Two times a day (BID) | ORAL | Status: DC
  Administered 2012-06-29 – 2012-07-01 (×3): 17 g via ORAL
  Filled 2012-06-28 (×7): qty 1

## 2012-06-28 MED ORDER — MUPIROCIN 2 % EX OINT
1.0000 "application " | TOPICAL_OINTMENT | Freq: Once | CUTANEOUS | Status: AC
  Administered 2012-06-28: 1 via TOPICAL
  Filled 2012-06-28: qty 22

## 2012-06-28 MED ORDER — PHENOL 1.4 % MT LIQD: 1.0000 | OROMUCOSAL | Status: DC | PRN

## 2012-06-28 MED ORDER — HYDROMORPHONE HCL PF 1 MG/ML IJ SOLN
INTRAMUSCULAR | Status: DC | PRN
  Administered 2012-06-28: 0.5 mg via INTRAVENOUS
  Administered 2012-06-28: 1 mg via INTRAVENOUS
  Administered 2012-06-28: 0.5 mg via INTRAVENOUS

## 2012-06-28 MED ORDER — TRIMETHOPRIM 100 MG PO TABS
100.0000 mg | ORAL_TABLET | Freq: Every day | ORAL | Status: DC
  Administered 2012-06-28 – 2012-07-01 (×3): 100 mg via ORAL
  Filled 2012-06-28 (×5): qty 1

## 2012-06-28 MED ORDER — FERROUS SULFATE 325 (65 FE) MG PO TABS
325.0000 mg | ORAL_TABLET | Freq: Three times a day (TID) | ORAL | Status: DC
  Administered 2012-06-28 – 2012-07-01 (×8): 325 mg via ORAL
  Filled 2012-06-28 (×12): qty 1

## 2012-06-28 MED ORDER — SODIUM CHLORIDE 0.9 % IV SOLN: INTRAVENOUS | Status: DC

## 2012-06-28 MED ORDER — PANTOPRAZOLE SODIUM 40 MG PO PACK
80.0000 mg | PACK | Freq: Every day | ORAL | Status: DC
  Administered 2012-06-29 – 2012-06-30 (×2): 80 mg via ORAL
  Filled 2012-06-28 (×6): qty 40

## 2012-06-28 MED ORDER — PANTOPRAZOLE SODIUM 40 MG PO TBEC
80.0000 mg | DELAYED_RELEASE_TABLET | Freq: Every day | ORAL | Status: DC
  Filled 2012-06-28: qty 2

## 2012-06-28 MED ORDER — VANCOMYCIN HCL IN DEXTROSE 1-5 GM/200ML-% IV SOLN: 1000.0000 mg | INTRAVENOUS | Status: DC

## 2012-06-28 MED ORDER — CLINDAMYCIN PHOSPHATE 900 MG/50ML IV SOLN
900.0000 mg | INTRAVENOUS | Status: AC
  Administered 2012-06-28: 900 mg via INTRAVENOUS

## 2012-06-28 MED ORDER — ACETAMINOPHEN 10 MG/ML IV SOLN
1000.0000 mg | Freq: Four times a day (QID) | INTRAVENOUS | Status: AC
  Administered 2012-06-28 – 2012-06-29 (×4): 1000 mg via INTRAVENOUS
  Filled 2012-06-28 (×7): qty 100

## 2012-06-28 MED ORDER — ACETAMINOPHEN 10 MG/ML IV SOLN
INTRAVENOUS | Status: DC | PRN
  Administered 2012-06-28: 1000 mg via INTRAVENOUS

## 2012-06-28 MED ORDER — ZOLPIDEM TARTRATE 5 MG PO TABS
5.0000 mg | ORAL_TABLET | Freq: Every evening | ORAL | Status: DC | PRN
  Filled 2012-06-28: qty 1

## 2012-06-28 MED ORDER — ROCURONIUM BROMIDE 100 MG/10ML IV SOLN
INTRAVENOUS | Status: DC | PRN
  Administered 2012-06-28: 40 mg via INTRAVENOUS

## 2012-06-28 MED ORDER — LACTATED RINGERS IV SOLN
INTRAVENOUS | Status: DC
  Administered 2012-06-28 (×2): via INTRAVENOUS
  Administered 2012-06-28: 1000 mL via INTRAVENOUS
  Administered 2012-06-28: 15:00:00 via INTRAVENOUS

## 2012-06-28 MED ORDER — ALUM & MAG HYDROXIDE-SIMETH 200-200-20 MG/5ML PO SUSP
30.0000 mL | ORAL | Status: DC | PRN
  Administered 2012-06-29: 30 mL via ORAL
  Filled 2012-06-28 (×2): qty 30

## 2012-06-28 MED ORDER — CIPROFLOXACIN IN D5W 400 MG/200ML IV SOLN
INTRAVENOUS | Status: DC | PRN
  Administered 2012-06-28: 400 mg via INTRAVENOUS

## 2012-06-28 MED ORDER — MEPERIDINE HCL 50 MG/ML IJ SOLN: 6.2500 mg | INTRAMUSCULAR | Status: DC | PRN

## 2012-06-28 MED ORDER — ONDANSETRON HCL 4 MG/2ML IJ SOLN
4.0000 mg | Freq: Four times a day (QID) | INTRAMUSCULAR | Status: DC | PRN
  Administered 2012-06-28 – 2012-06-29 (×2): 4 mg via INTRAVENOUS
  Filled 2012-06-28 (×2): qty 2

## 2012-06-28 MED ORDER — GLYCOPYRROLATE 0.2 MG/ML IJ SOLN
INTRAMUSCULAR | Status: DC | PRN
  Administered 2012-06-28: .4 mg via INTRAVENOUS

## 2012-06-28 MED ORDER — LORATADINE 10 MG PO TABS
10.0000 mg | ORAL_TABLET | Freq: Every day | ORAL | Status: DC
  Administered 2012-06-30 – 2012-07-01 (×2): 10 mg via ORAL
  Filled 2012-06-28 (×4): qty 1

## 2012-06-28 SURGICAL SUPPLY — 53 items
BAG ZIPLOCK 12X15 (MISCELLANEOUS) ×2 IMPLANT
BLADE SAW SGTL 18X1.27X75 (BLADE) ×2 IMPLANT
BRUSH FEMORAL CANAL (MISCELLANEOUS) ×2 IMPLANT
CLOTH BEACON ORANGE TIMEOUT ST (SAFETY) ×2 IMPLANT
DRAPE INCISE IOBAN 85X60 (DRAPES) ×2 IMPLANT
DRAPE ORTHO SPLIT 77X108 STRL (DRAPES) ×2
DRAPE POUCH INSTRU U-SHP 10X18 (DRAPES) ×2 IMPLANT
DRAPE SURG 17X11 SM STRL (DRAPES) ×2 IMPLANT
DRAPE SURG ORHT 6 SPLT 77X108 (DRAPES) ×2 IMPLANT
DRAPE U-SHAPE 47X51 STRL (DRAPES) ×2 IMPLANT
DRSG EMULSION OIL 3X16 NADH (GAUZE/BANDAGES/DRESSINGS) ×2 IMPLANT
DRSG MEPILEX BORDER 4X12 (GAUZE/BANDAGES/DRESSINGS) ×2 IMPLANT
DRSG MEPILEX BORDER 4X4 (GAUZE/BANDAGES/DRESSINGS) ×2 IMPLANT
DRSG MEPILEX BORDER 4X8 (GAUZE/BANDAGES/DRESSINGS) ×2 IMPLANT
DURAPREP 26ML APPLICATOR (WOUND CARE) ×2 IMPLANT
ELECT BLADE TIP CTD 4 INCH (ELECTRODE) ×2 IMPLANT
ELECT REM PT RETURN 9FT ADLT (ELECTROSURGICAL) ×2
ELECTRODE REM PT RTRN 9FT ADLT (ELECTROSURGICAL) ×1 IMPLANT
EVACUATOR 1/8 PVC DRAIN (DRAIN) ×2 IMPLANT
FACESHIELD LNG OPTICON STERILE (SAFETY) ×8 IMPLANT
GLOVE BIOGEL PI IND STRL 7.5 (GLOVE) ×1 IMPLANT
GLOVE BIOGEL PI IND STRL 8 (GLOVE) ×1 IMPLANT
GLOVE BIOGEL PI INDICATOR 7.5 (GLOVE) ×1
GLOVE BIOGEL PI INDICATOR 8 (GLOVE) ×1
GLOVE ORTHO TXT STRL SZ7.5 (GLOVE) ×4 IMPLANT
GLOVE SURG ORTHO 8.0 STRL STRW (GLOVE) ×2 IMPLANT
GOWN BRE IMP PREV XXLGXLNG (GOWN DISPOSABLE) ×4 IMPLANT
GOWN STRL NON-REIN LRG LVL3 (GOWN DISPOSABLE) ×2 IMPLANT
HANDPIECE INTERPULSE COAX TIP (DISPOSABLE) ×1
KIT BASIN OR (CUSTOM PROCEDURE TRAY) ×2 IMPLANT
MANIFOLD NEPTUNE II (INSTRUMENTS) ×2 IMPLANT
NS IRRIG 1000ML POUR BTL (IV SOLUTION) ×4 IMPLANT
PACK TOTAL JOINT (CUSTOM PROCEDURE TRAY) ×2 IMPLANT
POSITIONER SURGICAL ARM (MISCELLANEOUS) ×2 IMPLANT
PRESSURIZER FEMORAL UNIV (MISCELLANEOUS) ×2 IMPLANT
REMOVER STAPLE SKIN (DISPOSABLE) ×2 IMPLANT
SET HNDPC FAN SPRY TIP SCT (DISPOSABLE) ×1 IMPLANT
SPONGE GAUZE 4X4 12PLY (GAUZE/BANDAGES/DRESSINGS) ×2 IMPLANT
SPONGE LAP 18X18 X RAY DECT (DISPOSABLE) ×2 IMPLANT
SPONGE LAP 4X18 X RAY DECT (DISPOSABLE) ×2 IMPLANT
STAPLER VISISTAT (STAPLE) ×2 IMPLANT
STAPLER VISISTAT 35W (STAPLE) ×2 IMPLANT
SUCTION FRAZIER TIP 10 FR DISP (SUCTIONS) ×2 IMPLANT
SUT VIC AB 1 CT1 36 (SUTURE) ×6 IMPLANT
SUT VIC AB 2-0 CT1 27 (SUTURE) ×6
SUT VIC AB 2-0 CT1 TAPERPNT 27 (SUTURE) ×3 IMPLANT
SUT VIC AB 2-0 SH 27 (SUTURE) ×1
SUT VIC AB 2-0 SH 27X BRD (SUTURE) ×1 IMPLANT
SUT VLOC 180 0 24IN GS25 (SUTURE) ×6 IMPLANT
TOWEL OR 17X26 10 PK STRL BLUE (TOWEL DISPOSABLE) ×4 IMPLANT
TOWER CARTRIDGE SMART MIX (DISPOSABLE) ×2 IMPLANT
TRAY FOLEY CATH 14FRSI W/METER (CATHETERS) ×2 IMPLANT
WATER STERILE IRR 1500ML POUR (IV SOLUTION) ×4 IMPLANT

## 2012-06-28 NOTE — Anesthesia Postprocedure Evaluation (Signed)
  Anesthesia Post-op Note  Patient: [name]  Procedure(s) Performed: Procedure(s) (LRB): CONVERSION TO TOTAL HIP (Left)  Patient Location: PACU  Anesthesia Type: General  Level of Consciousness: awake and alert   Airway and Oxygen Therapy: Patient Spontanous Breathing  Post-op Pain: mild  Post-op Assessment: Post-op Vital signs reviewed, Patient's Cardiovascular Status Stable, Respiratory Function Stable, Patent Airway and No signs of Nausea or vomiting  Post-op Vital Signs: stable  Complications: No apparent anesthesia complications

## 2012-06-28 NOTE — Brief Op Note (Signed)
06/28/2012  5:41 PM  PATIENT:  [name]  78 y.o. female  PRE-OPERATIVE DIAGNOSIS:  Failed Left Hip IM Nailing related to comminuted left greater trochanter  POST-OPERATIVE DIAGNOSIS:   Failed Left Hip IM Nailing related to comminuted left greater trochanter  PROCEDURE:  Procedure(s) (LRB): CONVERSION of failed previous left hip surgery TO left TOTAL HIP Replacement(Left)  SURGEON:  Surgeon(s) and Role:    * [name], MD - Primary  PHYSICIAN ASSISTANT: Lanney Gins, PA-C  ANESTHESIA:   general  EBL:  Total I/O In: 4590 [I.V.:3900; Other:190; IV Piggyback:500] Out: 900 [Urine:300; Blood:600]  BLOOD ADMINISTERED:none  DRAINS: (1 medium) Hemovact drain(s) in the left hip with  Suction Open   LOCAL MEDICATIONS USED:  NONE  SPECIMEN:  No Specimen  DISPOSITION OF SPECIMEN:  N/A  COUNTS:  YES  TOURNIQUET:  * No tourniquets in log *  DICTATION: .Other Dictation: Dictation Number [id number]  PLAN OF CARE: Admit to inpatient   PATIENT DISPOSITION:  PACU - hemodynamically stable.   Delay start of Pharmacological VTE agent (>24hrs) due to surgical blood loss or risk of bleeding: no

## 2012-06-28 NOTE — H&P (View-Only) (Signed)
[name] is an 78 y.o. female.    Chief Complaint:  Left hip pain , post ORIF   HPI: Pt is a 78 y.o. female complaining of left hip pain.  Patient fell from standing height on 06/09/12. She c/o dull aching pain that was mild and constant but gets worse and sharp with motion. Dr Hewitt preformed a IM nailing of the left hip on 06/09/12, she tolerated the procedure well. After leaving the hospital she did well at first then, but then started to have pain in the left hip. The pain increased to the point of not being able to walk on the left leg. Dr. Olin saw the patient in the office and it was decided that the only way to fix the hip was with a different procedure. Risks, benefits and expectations were discussed with the patient. Patient understand the risks, benefits and expectations and wishes to proceed with conversion of the left IM nailing to a left total hip arthroplasty.   PCP:  PHARR,WALTER DAVIDSON, MD  D/C Plans:   SNF - return to [facility]  Post-op Meds:  No Rx given   Tranexamic Acid:   Not to be given  Decadron:   To be given  PMH: Past Medical History  Diagnosis Date  . Hyperlipidemia   . Mitral regurgitation     mild by echo 2003  . Allergic rhinitis   . Actinic keratosis   . Tenosynovitis     myxomatous  . Chronic constipation   . GERD (gastroesophageal reflux disease)   . Osteoarthritis   . Back pain   . Macular degeneration   . Hypertension     PSH: Past Surgical History  Procedure Date  . Synovectomy     of right long finger  . Hernial repain   . Inguinal hernia repair     laparoscopic preperitoneal repair of bilateral inguinal hernias  . Carpal tunnel release     bilateral  . Replacement total knee bilateral   . Tonisellectomy   . Tonisillectomy   . Tonsillectomy     Social History:  reports that she has quit smoking. Her smoking use included Cigarettes. She has a 2 pack-year smoking history. She does not have any smokeless tobacco history on  file. She reports that she drinks about 1.2 ounces of alcohol per week. She reports that she does not use illicit drugs.  Allergies:  Allergies  Allergen Reactions  . Aspirin   . Boniva (Ibandronic Acid)   . Morphine   . Nitrofuran Derivatives   . Penicillins   . Sulfonamide Derivatives     Medications: No current facility-administered medications for this encounter.   Current Outpatient Prescriptions  Medication Sig Dispense Refill  . cetirizine (ZYRTEC) 10 MG tablet Take 10 mg by mouth daily.       . Cholecalciferol (VITAMIN D3) 1000 UNITS tablet Take 1,000 Units by mouth daily.       . fish oil-omega-3 fatty acids 1000 MG capsule Take 1 g by mouth daily.        . HYDROcodone-acetaminophen (LORTAB) 7.5-500 MG per tablet 1 tablet. 1 tab four times daily      . Magnesium 250 MG TABS Take 1 tablet by mouth daily.       . Multiple Vitamin (MULTIVITAMIN) capsule Take 1 capsule by mouth daily.       . Multiple Vitamins-Minerals (PRESERVISION AREDS 2 PO) Take 1 tablet by mouth 2 (two) times daily.       .   trimethoprim (TRIMPEX) 100 MG tablet Take 100 mg by mouth daily.      . vitamin B-12 (CYANOCOBALAMIN) 500 MCG tablet Take 500 mcg by mouth daily.       . Wheat Dextrin (BENEFIBER DRINK MIX PO) Take by mouth. 2 tsp two times a day      . enoxaparin (LOVENOX) 40 MG/0.4ML injection Inject 40 mg into the skin See admin instructions. Daily for 2 weeks-stop on 06/27/12      . esomeprazole (NEXIUM) 40 MG capsule Take 40 mg by mouth daily before breakfast.      . ferrous sulfate 325 (65 FE) MG tablet Take 1 tablet (325 mg total) by mouth 3 (three) times daily with meals.      . olmesartan (BENICAR) 20 MG tablet Take 1 tablet (20 mg total) by mouth daily.      . oxyCODONE-acetaminophen (PERCOCET) 5-325 MG per tablet Take 1-2 tablets by mouth every 4 (four) hours as needed.  30 tablet  0  . senna-docusate (SENOKOT-S) 8.6-50 MG per tablet Take 1 tablet by mouth 2 (two) times daily.          ROS: Review of Systems  Constitutional: Positive for malaise/fatigue.  HENT: Negative.   Respiratory: Negative.   Cardiovascular: Negative.   Gastrointestinal: Positive for constipation.  Genitourinary: Negative.   Musculoskeletal: Positive for joint pain (left hip).  Skin: Negative.   Neurological: Positive for weakness.  Endo/Heme/Allergies: Negative.   Psychiatric/Behavioral: Negative.      Physical Exam: Blood pressure 116/60, pulse 73, temperature 98.1 F (36.7 C), temperature source Oral, resp. rate 18, SpO2 99.00%. Physical Exam  Constitutional: She is oriented to person, place, and time and well-developed, well-nourished, and in no distress. No distress.  HENT:  Head: Normocephalic and atraumatic.  Nose: Nose normal.  Mouth/Throat: Oropharynx is clear and moist.  Eyes: Pupils are equal, round, and reactive to light.  Neck: Neck supple. No JVD present. No tracheal deviation present. No thyromegaly present.  Cardiovascular: Normal rate, regular rhythm and intact distal pulses.   Pulmonary/Chest: Effort normal and breath sounds normal. No stridor. No respiratory distress. She has no wheezes. She has no rales. She exhibits no tenderness.  Abdominal: Soft. Bowel sounds are normal. There is no tenderness. There is no guarding.  Musculoskeletal:       Left hip: She exhibits decreased range of motion, decreased strength, tenderness, bony tenderness, deformity and laceration. She exhibits no swelling.  Lymphadenopathy:    She has no cervical adenopathy.  Neurological: She is alert and oriented to person, place, and time.  Skin: Skin is warm and dry.  Psychiatric: Affect normal.      Assessment/Plan Assessment:   Left hip pain , post ORIF    Plan: Patient will undergo a conversion of left hip IM nailing to a total hip arthroplasty on 06/28/2012 per Dr. Olin at [facility]. Risks benefits and expectation were discussed with the patient. Patient understand  risks, benefits and expectation and wishes to proceed.   Virtie Bungert S. Kaelan Emami   PAC  06/24/2012, 3:33 PM   

## 2012-06-28 NOTE — Plan of Care (Signed)
Problem: Consults Goal: Total Joint Replacement Patient Education See Patient Education Module for education specifics. Outcome: Progressing progressing   Problem: Phase I Progression Outcomes Goal: Pain controlled with appropriate interventions Outcome: Progressing progressing  Goal: Initial discharge plan identified Outcome: Progressing SNF Goal: Hemodynamically stable Outcome: Progressing progressing

## 2012-06-28 NOTE — Transfer of Care (Signed)
Immediate Anesthesia Transfer of Care Note  Patient: [name]  Procedure(s) Performed: Procedure(s) (LRB): CONVERSION TO TOTAL HIP (Left)  Patient Location: PACU  Anesthesia Type: General  Level of Consciousness: awake, alert  and oriented  Airway & Oxygen Therapy: Patient Spontanous Breathing and Patient connected to face mask oxygen  Post-op Assessment: Report given to PACU RN and Post -op Vital signs reviewed and stable  Post vital signs: Reviewed and stable  Complications: No apparent anesthesia complications

## 2012-06-28 NOTE — Interval H&P Note (Signed)
History and Physical Interval Note:  06/28/2012 12:14 PM  [name]  has presented today for surgery, with the diagnosis of Failed Left Hip IM Nailing  The various methods of treatment have been discussed with the patient and family. After consideration of risks, benefits and other options for treatment, the patient has consented to  Procedure(s) (LRB): LEFT  CONVERSION OF PREVIOUS LEFT HIP SURGERY TO A TOTAL HIP REPLACEMENT (Left) as a surgical intervention .  The patient's history has been reviewed, patient examined, no change in status, stable for surgery.  I have reviewed the patient's chart and labs.  Questions were answered to the patient's satisfaction.     [name]

## 2012-06-28 NOTE — Anesthesia Preprocedure Evaluation (Addendum)
Anesthesia Evaluation  Patient identified by MRN, date of birth, ID band Patient awake    Reviewed: Allergy & Precautions, H&P , NPO status , Patient's Chart, lab work & pertinent test results  Airway Mallampati: II TM Distance: >3 FB Neck ROM: Full    Dental No notable dental hx. (+) Poor Dentition   Pulmonary neg pulmonary ROS,  breath sounds clear to auscultation  Pulmonary exam normal       Cardiovascular hypertension, Pt. on medications negative cardio ROS  - Valvular Problems/MurmursRhythm:Regular Rate:Normal     Neuro/Psych negative neurological ROS  negative psych ROS   GI/Hepatic negative GI ROS, Neg liver ROS,   Endo/Other  negative endocrine ROS  Renal/GU negative Renal ROS  negative genitourinary   Musculoskeletal negative musculoskeletal ROS (+)   Abdominal   Peds negative pediatric ROS (+)  Hematology negative hematology ROS (+)   Anesthesia Other Findings   Reproductive/Obstetrics negative OB ROS                          Anesthesia Physical Anesthesia Plan  ASA: II  Anesthesia Plan: General   Post-op Pain Management:    Induction: Intravenous  Airway Management Planned: Oral ETT  Additional Equipment:   Intra-op Plan:   Post-operative Plan: Extubation in OR  Informed Consent: I have reviewed the patients History and Physical, chart, labs and discussed the procedure including the risks, benefits and alternatives for the proposed anesthesia with the patient or authorized representative who has indicated his/her understanding and acceptance.   Dental advisory given  Plan Discussed with: CRNA  Anesthesia Plan Comments:         Anesthesia Quick Evaluation                                   Anesthesia Evaluation  Patient identified by MRN, date of birth, ID band Patient awake    Reviewed: Allergy & Precautions, H&P , NPO status , Patient's Chart, lab  work & pertinent test results  History of Anesthesia Complications (+) PONV  Airway Mallampati: I TM Distance: >3 FB Neck ROM: Full    Dental  (+) Upper Dentures   Pulmonary    Pulmonary exam normal       Cardiovascular hypertension, + Valvular Problems/Murmurs  Mitral calcification mild MR stable   Ef NL    Neuro/Psych  Neuromuscular disease    GI/Hepatic GERD-  ,  Endo/Other    Renal/GU      Musculoskeletal   Abdominal (+) + obese,   Peds  Hematology   Anesthesia Other Findings   Reproductive/Obstetrics                         Anesthesia Physical Anesthesia Plan  ASA: III  Anesthesia Plan: General   Post-op Pain Management:    Induction: Intravenous  Airway Management Planned: Oral ETT  Additional Equipment:   Intra-op Plan:   Post-operative Plan: Extubation in OR  Informed Consent: I have reviewed the patients History and Physical, chart, labs and discussed the procedure including the risks, benefits and alternatives for the proposed anesthesia with the patient or authorized representative who has indicated his/her understanding and acceptance.     Plan Discussed with: CRNA, Surgeon and Anesthesiologist  Anesthesia Plan Comments:        Anesthesia Quick Evaluation

## 2012-06-28 NOTE — Progress Notes (Signed)
Pt has been on the bedpan 2x while in short stay and had been unable to void. She stated she is not uncomfortable and that she voided last time at 0400 today

## 2012-06-29 LAB — BASIC METABOLIC PANEL
CO2: 27 mEq/L (ref 19–32)
Chloride: 96 mEq/L (ref 96–112)
Glucose, Bld: 145 mg/dL — ABNORMAL HIGH (ref 70–99)
Potassium: 5.3 mEq/L — ABNORMAL HIGH (ref 3.5–5.1)
Sodium: 129 mEq/L — ABNORMAL LOW (ref 135–145)

## 2012-06-29 LAB — CBC
Hemoglobin: 8.2 g/dL — ABNORMAL LOW (ref 12.0–15.0)
MCH: 30.1 pg (ref 26.0–34.0)
RBC: 2.72 MIL/uL — ABNORMAL LOW (ref 3.87–5.11)
WBC: 13.2 10*3/uL — ABNORMAL HIGH (ref 4.0–10.5)

## 2012-06-29 MED ORDER — MAGNESIUM OXIDE 400 (241.3 MG) MG PO TABS
200.0000 mg | ORAL_TABLET | Freq: Every day | ORAL | Status: DC
  Administered 2012-06-29 – 2012-06-30 (×2): 200 mg via ORAL
  Filled 2012-06-29 (×3): qty 0.5

## 2012-06-29 MED ORDER — PROMETHAZINE HCL 25 MG RE SUPP: 12.5000 mg | Freq: Four times a day (QID) | RECTAL | Status: DC | PRN

## 2012-06-29 MED ORDER — IRBESARTAN 150 MG PO TABS
150.0000 mg | ORAL_TABLET | Freq: Every day | ORAL | Status: DC
  Administered 2012-06-29 – 2012-06-30 (×2): 150 mg via ORAL
  Filled 2012-06-29 (×3): qty 1

## 2012-06-29 MED ORDER — PROMETHAZINE HCL 25 MG PO TABS: 12.5000 mg | ORAL_TABLET | Freq: Four times a day (QID) | ORAL | Status: DC | PRN

## 2012-06-29 MED ORDER — PROMETHAZINE HCL 25 MG/ML IJ SOLN: 12.5000 mg | Freq: Four times a day (QID) | INTRAMUSCULAR | Status: DC | PRN

## 2012-06-29 NOTE — Op Note (Signed)
[name], [name] NO.:  [id number]  MEDICAL RECORD NO.:  [id number]  LOCATION:  1540                         FACILITY:  [facility]  PHYSICIAN:  Madlyn Frankel. Charlann Boxer, M.D.  DATE OF BIRTH:  Jun 01, 1926  DATE OF PROCEDURE:  06/28/2012 DATE OF DISCHARGE:                              OPERATIVE REPORT   PREOPERATIVE DIAGNOSIS:  Failed left previous hip surgery related to advanced comminution of the greater trochanter portion of an intertroch fracture.  POSTOPERATIVE DIAGNOSIS:  Failed left previous hip surgery related to advanced comminution of the greater trochanter portion of an intertroch fracture.  PROCEDURE:  Conversion of previous left hip surgery to a left total hip replacement.  COMPONENTS USED:  A DePuy hip system, size 52 mm pinnacle shell 36+ 4 neutral AltrX liner and size 18 small stature AML prosthetic stem with 36+ 5 metal ball.  SURGEON:  Madlyn Frankel. Charlann Boxer, MD  ASSISTANT:  Lanney Gins, PA-C  Note, Mr. [name] was present for the entirety of the case, utilized for management of the operative extremity and protection of vital soft tissues, primary wound closure, and general facilitation of the case.  ANESTHESIA:  General.  SPECIMEN:  None.  BLOOD LOSS:  About 600 mL.  DRAINS:  One medium Hemovac.  COMPLICATION:  None apparent.  INDICATION FOR THE PROCEDURE:  Ms. [name] is an 78 year old female with a comminuted left peritrochanteric femur fractures fixed by my partners. She was seen in the office with early followup with early and advanced collapse and separation of fracture fragment.  After reviewing Ms. [name]'s family current situation, the options were discussed and continued observation versus proceeding with surgery to correct this problem.  Given the potential for nonunion, but also significant malunion with the painful hardware and the potential need for future surgery down the road.  They elected at this point to proceed with  conversion to a left total hip replacement in an earlier stage and discussed the risk of infection, DVT, component failure, dislocation, need for future revision surgery.  Consent was obtained for benefit of pain relief and improved function as she had been nonfunctioning for past 2 weeks or so.  PROCEDURE IN DETAIL:  The patient was brought to operative theater. Once adequate anesthesia, preop antibiotics administered, she was positioned into the right lateral decubitus position with left side up. The left lower extremity was then prepped and draped in sterile fashion. Time-out was performed identifying the patient, planned procedure, and extremity.  Please note the patient's old staples were removed.  Her hip wounds were cleaned and scrubbed of any potential fragmentation or of any skin debris.  The left hip was then prepped and draped in  sterile fashion.  Following a time-out, I basically demarcated an incision in the tendon of the other incisions that was for posterior approach, I did incorporate, the incision was used for the lag screw to revise the scar.  Soft tissue dissection was carried down to the iliotibial band, gluteal fascia which was then incised for posterior approach.  The area of the lag screw and anti-rotational screw was identified and the anti-rotational screw removed and other lag screw exposed.  Please note that prior to  this, larger proximal incision, a distal interlock screw was removed through a smaller incision distally and without difficulty.  At this point, the proximal femur was exposed.  The comminuted segments were identified but however with external rotation, I was able to expose the hip joint itself.  Once the hip was exposed and an L capsulotomy was made, I did went ahead and dislocated the hip joint to make certain that we dislocate easily related to the nonunited segments.  At this point, within the greater trochanteric segments, I was  able to identify very easily the proximal aspect of the femoral nail.  The extraction device was screwed into the top portion of the femoral nail. Once this was in place, the lag screw was removed without difficulty. The nail was then backed out without complication.  At this point, attention was now directed to preparation of the femoral canal with the femur exposed and retractors placed, I debrided a fibrinous material into the proximal femur and then began reaming with 11 mm reamer and actually I had reamed up to about a 16.5 mm before I got decent bite within the proximal femur to isthmus region. I reamed up to a 17.5 and selected 18 AML stem size.  I then broached with the 15 small statured broach, then a 16.5, then 18.  At this point, I packed off the femur and attended to the acetabulum.  Acetabular retractors were placed.  Labrum was debrided and began reaming with a 47 reamer, reamed up to 51 reamer with good bony bed preparation.  The 52 pinnacle cup was selected, impacted, and fixed with a single cancellous screw 36.  Hole eliminator was placed and a 36+ 4 neutral AltrX liner was positioned.  At this point, I did trial reduction with the 18 small stature broach in place, a standard neck.  Trial reduction was carried out with 36+ 1.5 ball.  Upon anteversion at about 50 degrees, the hip was very stable, hip flexion and internal rotation, I did not sense any evidence of any subluxation or impingement anteriorly.  There was none posteriorly with extension and external rotation.  Given all these findings, the trial components were removed.  The final 18 small stature AML stem was opened and following irrigation of the canal, it was impacted and eventually sat at the level where the broach was.  Based on this and a repeat trial reduction, I selected 36 +5 metal ball which was impacted on the clean and dry trunnion, the hip was reduced.  There was some comminuted portion in the  greater trochanter or maybe even some of the anteriorly comminuted trochanteric segment which I removed anteriorly to try to prevent impingement even though there is none, no signs of any subluxation or any further bony overgrowth that could cause complication.  This was removed through the posterior aspect of the exposure.  Following irrigation of the hip joint, I reapproximated the posterior capsule to the superior leaflet using #1 Vicryl.  The remaining wound was closed over the site of the iliotibial band and gluteal fascia were then reapproximated using #1 Vicryl and 0 V-Loc suture.  The remaining wound was closed with 2-0 Vicryl and the subcutaneous layer was placed on the distal interlock incision as well as in the proximal aspect.  The skin edges were reapproximated using staples.  The hip was then cleaned, dried, and dressed sterilely using Mepilex dressing.  She was then brought to the recovery room in stable condition, tolerating procedure well.  We will plan to have her being partial weightbearing for 4-6 weeks.  We will follow in the hospital anywhere from 2-3 days.  She will return to skilled nursing facility for postoperative therapy and recovery.     Madlyn Frankel Charlann Boxer, M.D.     MDO/MEDQ  D:  06/28/2012  T:  06/29/2012  Job:  [id number]

## 2012-06-29 NOTE — Progress Notes (Signed)
Clinical Social Work Department BRIEF PSYCHOSOCIAL ASSESSMENT 06/29/2012  Patient:  [name],[name]     Account Number:  [id number]     Admit date:  06/28/2012  Clinical Social Worker:  Catie Chiao, LCSW  Date/Time:  06/29/2012 02:12 PM  Referred by:  Physician  Date Referred:  06/29/2012 Referred for  SNF Placement   Other Referral:   Interview type:  Patient Other interview type:    PSYCHOSOCIAL DATA Living Status:  ALONE Admitted from facility:   Level of care:   Primary support name:  [name] Primary support relationship to patient:  CHILD, ADULT Degree of support available:   supportive    CURRENT CONCERNS Current Concerns  Post-Acute Placement   Other Concerns:    SOCIAL WORK ASSESSMENT / PLAN Pt is an 78 yr old female admitted from [facility] ( [facility]. CSW met with pt and family to assist with d/c planning. Pt plans to return to [facility] following hospital d/c. CSW contacted SNF and confirmed d/c plan. CSW will continue to follow to assist with d/c to SNF.   Assessment/plan status:  Psychosocial Support/Ongoing Assessment of Needs Other assessment/ plan:   Information/referral to community resources:   None needed at this time.    PATIENT'S/FAMILY'S RESPONSE TO PLAN OF CARE: Pt is looking forward to returning to [facility] when ready for d/c.     Sonny Anthes LCSW [phone]    

## 2012-06-29 NOTE — Progress Notes (Signed)
  Subjective: 1 Day Post-Op Procedure(s) (LRB): CONVERSION TO TOTAL HIP (Left)   Patient reports pain as mild, pain well controlled. Patient is having some nausea,but otherwise no events.  Objective:   VITALS:   Filed Vitals:   06/29/12 1200  BP: 99/44  Pulse: 85  Temp: 97.9 F (36.6 C)  Resp: 18    Neurovascular intact Dorsiflexion/Plantar flexion intact Incision: dressing C/D/I No cellulitis present Compartment soft  LABS  Basename 06/29/12 0412 06/28/12 1644 06/28/12 0945  HGB 8.2* 8.6* 12.1  HCT 24.8* 25.4* 35.7*  WBC 13.2* -- 9.9  PLT 453* -- 596*     Basename 06/29/12 0412 06/28/12 0945  NA 129* 133*  K 5.3* 4.5  BUN 13 15  CREATININE 0.55 0.57  GLUCOSE 145* 102*     Assessment/Plan: 1 Day Post-Op Procedure(s) (LRB): CONVERSION TO TOTAL HIP (Left)   HV drain d/c'ed Foley cath d/c'ed Advance diet Up with therapy D/C IV fluids Plan os eventually for discharge to SNF   Anastasio Auerbach. Catharina Pica   PAC  06/29/2012, 12:31 PM

## 2012-06-29 NOTE — Progress Notes (Signed)
Clinical Social Work Department BRIEF PSYCHOSOCIAL ASSESSMENT 06/29/2012  Patient:  [name], [name]     Account Number:  [id number]     Admit date:  06/28/2012  Clinical Social Worker:  Candie Chroman  Date/Time:  06/29/2012 02:12 PM  Referred by:  Physician  Date Referred:  06/29/2012 Referred for  SNF Placement   Other Referral:   Interview type:  Patient Other interview type:    PSYCHOSOCIAL DATA Living Status:  ALONE Admitted from facility:   Level of care:   Primary support name:  Ronalee Belts Primary support relationship to patient:  CHILD, ADULT Degree of support available:   supportive    CURRENT CONCERNS Current Concerns  Post-Acute Placement   Other Concerns:    SOCIAL WORK ASSESSMENT / PLAN Pt is an 78 yr old female admitted from [facility] ( [facility]. CSW met with pt and family to assist with d/c planning. Pt plans to return to [facility] following hospital d/c. CSW contacted SNF and confirmed d/c plan. CSW will continue to follow to assist with d/c to SNF.   Assessment/plan status:  Psychosocial Support/Ongoing Assessment of Needs Other assessment/ plan:   Information/referral to community resources:   None needed at this time.    PATIENT'S/FAMILY'S RESPONSE TO PLAN OF CARE: Pt is looking forward to returning to [facility] when ready for d/c.     Cori Razor LCSW [phone]

## 2012-06-29 NOTE — Evaluation (Signed)
Physical Therapy Evaluation Patient Details Name: [name] MRN: [id number] DOB: 12-05-1925 Today's Date: 06/29/2012 Time: 0913-0950 PT Time Calculation (min): 37 min  PT Assessment / Plan / Recommendation Clinical Impression  78 y.o. female who had a IM nailing of the left hip on 06/09/12 which failed.  She now is POD #1 for a conversion to L THA. Pt had been bed bound at [facility] for past 10 days, per daughters, after L hip failed. +2 total assist for supine to sit and sit to stand with a RW. Pt unable to pivot or take a step in standing. She became dizzy in standing so was returned to bed. She would benefit from acute PT to maximize safety and independence with mobility. Plan is to return to SNF.     PT Assessment  Patient needs continued PT services    Follow Up Recommendations  Skilled nursing facility    Barriers to Discharge None      Equipment Recommendations  Defer to next venue    Recommendations for Other Services OT consult   Frequency Min 6X/week    Precautions / Restrictions Precautions Precautions: Posterior Hip Restrictions Weight Bearing Restrictions: Yes LLE Weight Bearing: Partial weight bearing LLE Partial Weight Bearing Percentage or Pounds: 50   Pertinent Vitals/Pain *BP lying 107/62, HR 89, SaO2 99% on RA BP sitting 116/73, HR 86, SaO2 99% on RA** Noted Hgb 8.2 this morning Pain 4/10 L hip, ice applied      Mobility  Bed Mobility Bed Mobility: Supine to Sit Supine to Sit: 1: +2 Total assist Supine to Sit: Patient Percentage: 30% Transfers Transfers: Sit to Stand;Stand to Sit Sit to Stand: Patient Percentage: 30% Stand to Sit: 1: +2 Total assist Stand to Sit: Patient Percentage: 50% Details for Transfer Assistance: pt required assist to achieve upright standing position with RW, stood for 90 sec then became dizzy and was returned to bed. Unable to weight shift to take a step with RW. Assessed orthostatic BP (see docflowsheets), no drop in BP  with supine to sit. Ambulation/Gait Ambulation/Gait Assistance: Not tested (comment)    Exercises Total Joint Exercises Ankle Circles/Pumps: AROM;Both;10 reps;Supine Heel Slides: AAROM;Left;10 reps;Supine Hip ABduction/ADduction: AAROM;Left;10 reps;Supine   PT Diagnosis: Difficulty walking;Generalized weakness;Acute pain  PT Problem List: Decreased strength;Decreased activity tolerance;Decreased mobility;Pain;Decreased knowledge of precautions;Decreased knowledge of use of DME PT Treatment Interventions: Gait training;DME instruction;Therapeutic activities;Therapeutic exercise;Patient/family education   PT Goals Acute Rehab PT Goals PT Goal Formulation: With patient Time For Goal Achievement: 07/01/12 Potential to Achieve Goals: Fair Pt will go Supine/Side to Sit: with HOB 0 degrees;with mod assist PT Goal: Supine/Side to Sit - Progress: Goal set today Pt will go Sit to Stand: with upper extremity assist;with mod assist PT Goal: Sit to Stand - Progress: Goal set today Pt will Transfer Bed to Chair/Chair to Bed: with mod assist PT Transfer Goal: Bed to Chair/Chair to Bed - Progress: Goal set today Pt will Ambulate: with rolling walker;1 - 15 feet;with mod assist PT Goal: Ambulate - Progress: Goal set today  Visit Information  Last PT Received On: 06/29/12 Assistance Needed: +2    Subjective Data  Subjective: I've been nauseous all night.  Patient Stated Goal: to walk   Prior Functioning  Home Living Lives With: Son Available Help at Discharge:  (Pt was admitted from [facility] where she was receiving ST rehab) Type of Home: House Home Access: Stairs to enter [employer] of Steps: 4 Entrance Stairs-Rails: Can reach both Home Layout: Able to  live on main level with bedroom/bathroom Bathroom Shower/Tub: Health visitor: Handicapped height Bathroom Accessibility: Yes How Accessible: Accessible via walker Home Adaptive Equipment: Walker -  rolling;Bedside commode/3-in-1 Additional Comments: pt used RW and cane at home prior to hip fx earlier this month. She was at [facility] SNF for rehab  where about 10 days ago her hip ORIF failed and she's been on bedrest since then. Pt plans to return to SNF, then home. Prior Function Level of Independence: Independent with assistive device(s) Able to Take Stairs?: Yes Driving: Yes Vocation: Retired Musician: No difficulties Dominant Hand: Right    Cognition  Overall Cognitive Status: Appears within functional limits for tasks assessed/performed Arousal/Alertness: Awake/alert Orientation Level: Appears intact for tasks assessed Behavior During Session: [facility] for tasks performed    Extremity/Trunk Assessment Right Upper Extremity Assessment RUE ROM/Strength/Tone: Within functional levels Left Upper Extremity Assessment LUE ROM/Strength/Tone: Within functional levels Right Lower Extremity Assessment RLE ROM/Strength/Tone: Within functional levels Left Lower Extremity Assessment LLE ROM/Strength/Tone: Deficits;Due to pain LLE ROM/Strength/Tone Deficits: minimal quad set, limited voluntary L LE mvmt due to pain, AAROM L hip limited approx 50% due to pain Trunk Assessment Trunk Assessment: Kyphotic   Balance Balance Balance Assessed: Yes Static Sitting Balance Static Sitting - Balance Support: Feet supported;Bilateral upper extremity supported Static Sitting - Level of Assistance: 5: Stand by assistance Static Sitting - Comment/# of Minutes: 3  End of Session PT - End of Session Equipment Utilized During Treatment: Gait belt Activity Tolerance: Patient limited by fatigue;Patient limited by pain Patient left: in bed;with call bell/phone within reach;with family/visitor present Nurse Communication: Mobility status  GP     Ralene Bathe Kistler 06/29/2012, 11:29 AM  [phone]

## 2012-06-29 NOTE — Progress Notes (Signed)
Physical Therapy Treatment Patient Details Name: [name] MRN: [id number] DOB: 1926-02-10 Today's Date: 06/29/2012 Time: 3086-5784 PT Time Calculation (min): 10 min  PT Assessment / Plan / Recommendation Comments on Treatment Session  Ther ex performed in bed, pt too fatigued to get OOB. Will work on bed mobility and transfers tomorrow.    Follow Up Recommendations  Skilled nursing facility    Barriers to Discharge None      Equipment Recommendations  Defer to next venue    Recommendations for Other Services OT consult  Frequency Min 6X/week   Plan Discharge plan remains appropriate;Frequency remains appropriate    Precautions / Restrictions Precautions Precautions: Posterior Hip Restrictions Weight Bearing Restrictions: Yes LLE Weight Bearing: Partial weight bearing LLE Partial Weight Bearing Percentage or Pounds: 50   Pertinent Vitals/Pain **3/10 L hip, premedicated, ice applied*       Exercises Total Joint Exercises Ankle Circles/Pumps: AROM;Both;10 reps;Supine Quad Sets: AROM;Both;10 reps;Supine Towel Squeeze: AROM;Both;10 reps;Supine Short Arc Quad: AAROM;Left;10 reps;Supine Heel Slides: AAROM;Left;10 reps;Supine Hip ABduction/ADduction: AAROM;Left;10 reps;Supine   PT Diagnosis: Difficulty walking;Generalized weakness;Acute pain  PT Problem List: Decreased strength;Decreased activity tolerance;Decreased mobility;Pain;Decreased knowledge of precautions;Decreased knowledge of use of DME PT Treatment Interventions: Gait training;DME instruction;Therapeutic activities;Therapeutic exercise;Patient/family education   PT Goals Acute Rehab PT Goals PT Goal Formulation: With patient Time For Goal Achievement: 07/01/12 Potential to Achieve Goals: Fair Pt will go Supine/Side to Sit: with HOB 0 degrees;with mod assist PT Goal: Supine/Side to Sit - Progress: Goal set today Pt will go Sit to Stand: with upper extremity assist;with mod assist PT Goal: Sit to Stand -  Progress: Goal set today Pt will Transfer Bed to Chair/Chair to Bed: with mod assist PT Transfer Goal: Bed to Chair/Chair to Bed - Progress: Goal set today Pt will Ambulate: with rolling walker;1 - 15 feet;with mod assist PT Goal: Ambulate - Progress: Goal set today  Visit Information  Last PT Received On: 06/29/12 Assistance Needed: +2    Subjective Data  Subjective: I'm ready for a nap.  Patient Stated Goal: be able to walk   Cognition  Overall Cognitive Status: Appears within functional limits for tasks assessed/performed Arousal/Alertness: Awake/alert Orientation Level: Appears intact for tasks assessed Behavior During Session: [facility] for tasks performed    Balance  Balance Balance Assessed: Yes Static Sitting Balance Static Sitting - Balance Support: Feet supported;Bilateral upper extremity supported Static Sitting - Level of Assistance: 5: Stand by assistance Static Sitting - Comment/# of Minutes: 3  End of Session PT - End of Session Equipment Utilized During Treatment: Gait belt Activity Tolerance: Patient limited by fatigue;Patient limited by pain Patient left: in bed;with call bell/phone within reach;with family/visitor present Nurse Communication: Mobility status   GP     Ralene Bathe Kistler 06/29/2012, 1:41 PM [phone]

## 2012-06-30 LAB — BASIC METABOLIC PANEL
BUN: 8 mg/dL (ref 6–23)
GFR calc Af Amer: >90 mL/min (ref >90)
GFR calc non Af Amer: >90 mL/min (ref >90)
Potassium: 4 mEq/L (ref 3.5–5.1)
Sodium: 132 mEq/L — ABNORMAL LOW (ref 135–145)

## 2012-06-30 LAB — CBC
HCT: 23.9 % — ABNORMAL LOW (ref 36.0–46.0)
MCHC: 32.6 g/dL (ref 30.0–36.0)
Platelets: 461 10*3/uL — ABNORMAL HIGH (ref 150–400)
RDW: 14.1 % (ref 11.5–15.5)

## 2012-06-30 MED ORDER — CIPROFLOXACIN HCL 500 MG PO TABS
500.0000 mg | ORAL_TABLET | ORAL | Status: DC
  Administered 2012-06-30 – 2012-07-01 (×2): 500 mg via ORAL
  Filled 2012-06-30 (×4): qty 1

## 2012-06-30 MED ORDER — FLEET ENEMA 7-19 GM/118ML RE ENEM
1.0000 | ENEMA | Freq: Every day | RECTAL | Status: DC | PRN
  Administered 2012-06-30: 1 via RECTAL
  Filled 2012-06-30: qty 1

## 2012-06-30 NOTE — Plan of Care (Signed)
Problem: Phase II Progression Outcomes Goal: Ambulates Outcome: Not Progressing Pt only able to transfer to recliner.  Mobility limited by dizziness and L LE pain.

## 2012-06-30 NOTE — Progress Notes (Signed)
Physical Therapy Treatment Patient Details Name: [name] MRN: [id number] DOB: 04-Jun-1926 Today's Date: 06/30/2012 Time: 1012-1032 PT Time Calculation (min): 20 min  PT Assessment / Plan / Recommendation Comments on Treatment Session  Pt continues to report dizziness with standing however reporting dizziness resolved once pumping ankles in sitting/reclined.  Pt able to assist more with mobility today however still requiring +2 assist and limited by pain.  Pt assisted to [facility] however unable to void/BM so recliner brought behind pt.    Follow Up Recommendations  Skilled nursing facility    Barriers to Discharge        Equipment Recommendations  Defer to next venue    Recommendations for Other Services    Frequency     Plan Discharge plan remains appropriate;Frequency remains appropriate    Precautions / Restrictions Precautions Precautions: Posterior Hip Restrictions Weight Bearing Restrictions: Yes LLE Weight Bearing: Partial weight bearing LLE Partial Weight Bearing Percentage or Pounds: 50   Pertinent Vitals/Pain 6/10 L LE pain, repositioned, RN made aware    Mobility  Bed Mobility Bed Mobility: Supine to Sit Supine to Sit: HOB elevated;With rails;1: +2 Total assist Supine to Sit: Patient Percentage: 60% Details for Bed Mobility Assistance: verbal cues for technique and increased time to allow pt to assist, assist for L LE, trunk, and scooting hips to EOB Transfers Transfers: Sit to Stand;Stand to Sit Sit to Stand: 1: +2 Total assist;With upper extremity assist;From chair/3-in-1;From bed Sit to Stand: Patient Percentage: 50% Stand to Sit: 1: +2 Total assist;To chair/3-in-1;With upper extremity assist Stand to Sit: Patient Percentage: 40% Stand Pivot Transfers: 1: +2 Total assist Stand Pivot Transfers: Patient Percentage: 60% Details for Transfer Assistance: verbal cues for safe technique while keeping hip precautions, assist for weakness and pain, used RW for pivot,  pt mainly WBing through R LE with verbal cues to use UEs through RW to assist for L LE pain Ambulation/Gait Ambulation/Gait Assistance: Not tested (comment)    Exercises     PT Diagnosis:    PT Problem List:   PT Treatment Interventions:     PT Goals Acute Rehab PT Goals PT Goal: Supine/Side to Sit - Progress: Progressing toward goal PT Goal: Sit to Stand - Progress: Progressing toward goal PT Transfer Goal: Bed to Chair/Chair to Bed - Progress: Progressing toward goal  Visit Information  Last PT Received On: 06/30/12 Assistance Needed: +2    Subjective Data  Subjective: pt agreeable to use BSC then sit up in recliner   Cognition  Overall Cognitive Status: Appears within functional limits for tasks assessed/performed Arousal/Alertness: Awake/alert Orientation Level: Appears intact for tasks assessed Behavior During Session: [facility] for tasks performed    Balance     End of Session PT - End of Session Equipment Utilized During Treatment: Gait belt Activity Tolerance: Patient limited by fatigue;Patient limited by pain Patient left: in chair;with call bell/phone within reach;with family/visitor present Nurse Communication: Patient requests pain meds (lift pad under pt in recliner)   GP     Coriana Angello,KATHrine E 06/30/2012, 11:03 AM Pager: [phone]

## 2012-06-30 NOTE — Progress Notes (Signed)
Physical Therapy Treatment Note   06/30/12 1400  PT Visit Information  Last PT Received On 06/30/12  Assistance Needed +2  PT Time Calculation  PT Start Time 1350  PT Stop Time 1401  PT Time Calculation (min) 11 min  Subjective Data  Subjective Pt too tired for transfers but agreeable to exercises in recliner.  Precautions  Precautions Posterior Hip  Restrictions  Weight Bearing Restrictions Yes  LLE Weight Bearing PWB  LLE Partial Weight Bearing Percentage or Pounds 50  Cognition  Overall Cognitive Status Appears within functional limits for tasks assessed/performed  Total Joint Exercises  Ankle Circles/Pumps AROM;Both;10 reps  [employer] AROM;Strengthening;Both;20 reps  Short Arc [employer];Left;10 reps;Supine  Heel Slides AAROM;Left;10 reps;Supine;Limitations  Hip ABduction/ADduction AAROM;Left;10 reps;Supine  Gluteal Sets Strengthening;Both;10 reps  Heel Slides Limitations within precautions  PT - End of Session  Activity Tolerance Patient limited by fatigue  Patient left in chair;with call bell/phone within reach;with family/visitor present  PT - Assessment/Plan  Comments on Treatment Session Pt reports too fatigued for transfers but agreeable to exercises.  Reviewed posterior hip precautions with pt.  Also placed chair under leg rest to keep LEs elevated and for pt safety (not slip out of recliner).  Daughter in room for treatment.   Pillow also placed between pt's knees to cue for precautions as pt planned on taking a nap after therapy.  PT Plan Discharge plan remains appropriate;Frequency remains appropriate  Follow Up Recommendations Skilled nursing facility  Equipment Recommended Defer to next venue  Acute Rehab PT Goals  PT Goal: Supine/Side to Sit - Progress Progressing toward goal  PT General Charges  $$ ACUTE PT VISIT 1 Procedure  PT Treatments  $Therapeutic Exercise 8-22 mins    Zenovia Jarred, PT Pager: [phone]

## 2012-06-30 NOTE — Progress Notes (Signed)
Patient ID: [name], female   DOB: 1926/09/18, 78 y.o.   MRN: [id number] Subjective: 2 Days Post-Op Procedure(s) (LRB): CONVERSION TO TOTAL HIP (Left)    Patient reports pain as moderate in left hip area.  Limited activity yesterday, limited activity past 2 weeks   Objective:   VITALS:   Filed Vitals:   06/30/12 0541  BP: 141/58  Pulse: 96  Temp: 98.9 F (37.2 C)  Resp: 16    Neurologically intact Incision: dressing C/D/I  LABS  Basename 06/30/12 0418 06/29/12 0412 06/28/12 1644 06/28/12 0945  HGB 7.8* 8.2* 8.6* --  HCT 23.9* 24.8* 25.4* --  WBC 12.8* 13.2* -- 9.9  PLT 461* 453* -- 596*     Basename 06/30/12 0418 06/29/12 0412 06/28/12 0945  NA 132* 129* 133*  K 4.0 5.3* 4.5  BUN 8 13 15   CREATININE 0.43* 0.55 0.57  GLUCOSE 115* 145* 102*     Basename 06/28/12 0945  LABPT --  INR 1.00     Assessment/Plan: 2 Days Post-Op Procedure(s) (LRB): CONVERSION TO TOTAL HIP (Left)   Advance diet Up with therapy Discharge to SNF probably tomorrow

## 2012-06-30 NOTE — Progress Notes (Signed)
Occupational Therapy Note Order noted. See pt planning SNF. Will defer OT to SNF.  Judithann Sauger OTR/L [phone] 06/30/2012

## 2012-07-01 DIAGNOSIS — S72009A Fracture of unspecified part of neck of unspecified femur, initial encounter for closed fracture: Secondary | ICD-10-CM | POA: Diagnosis not present

## 2012-07-01 DIAGNOSIS — M1990 Unspecified osteoarthritis, unspecified site: Secondary | ICD-10-CM | POA: Diagnosis not present

## 2012-07-01 DIAGNOSIS — D649 Anemia, unspecified: Secondary | ICD-10-CM | POA: Diagnosis not present

## 2012-07-01 DIAGNOSIS — D62 Acute posthemorrhagic anemia: Secondary | ICD-10-CM

## 2012-07-01 DIAGNOSIS — K5900 Constipation, unspecified: Secondary | ICD-10-CM | POA: Diagnosis not present

## 2012-07-01 DIAGNOSIS — K219 Gastro-esophageal reflux disease without esophagitis: Secondary | ICD-10-CM | POA: Diagnosis not present

## 2012-07-01 DIAGNOSIS — J309 Allergic rhinitis, unspecified: Secondary | ICD-10-CM | POA: Diagnosis not present

## 2012-07-01 DIAGNOSIS — M549 Dorsalgia, unspecified: Secondary | ICD-10-CM | POA: Diagnosis not present

## 2012-07-01 DIAGNOSIS — M659 Synovitis and tenosynovitis, unspecified: Secondary | ICD-10-CM | POA: Diagnosis not present

## 2012-07-01 DIAGNOSIS — T84498A Other mechanical complication of other internal orthopedic devices, implants and grafts, initial encounter: Secondary | ICD-10-CM | POA: Diagnosis not present

## 2012-07-01 DIAGNOSIS — I059 Rheumatic mitral valve disease, unspecified: Secondary | ICD-10-CM | POA: Diagnosis not present

## 2012-07-01 DIAGNOSIS — Z96649 Presence of unspecified artificial hip joint: Secondary | ICD-10-CM | POA: Diagnosis not present

## 2012-07-01 DIAGNOSIS — S72009D Fracture of unspecified part of neck of unspecified femur, subsequent encounter for closed fracture with routine healing: Secondary | ICD-10-CM | POA: Diagnosis not present

## 2012-07-01 DIAGNOSIS — L270 Generalized skin eruption due to drugs and medicaments taken internally: Secondary | ICD-10-CM | POA: Diagnosis not present

## 2012-07-01 DIAGNOSIS — Z5189 Encounter for other specified aftercare: Secondary | ICD-10-CM | POA: Diagnosis not present

## 2012-07-01 DIAGNOSIS — E785 Hyperlipidemia, unspecified: Secondary | ICD-10-CM | POA: Diagnosis not present

## 2012-07-01 DIAGNOSIS — M25559 Pain in unspecified hip: Secondary | ICD-10-CM | POA: Diagnosis not present

## 2012-07-01 DIAGNOSIS — L570 Actinic keratosis: Secondary | ICD-10-CM | POA: Diagnosis not present

## 2012-07-01 DIAGNOSIS — I10 Essential (primary) hypertension: Secondary | ICD-10-CM | POA: Diagnosis not present

## 2012-07-01 LAB — URINE CULTURE: Colony Count: >=100000

## 2012-07-01 LAB — CBC
MCH: 31 pg (ref 26.0–34.0)
MCHC: 33.8 g/dL (ref 30.0–36.0)
Platelets: 381 10*3/uL (ref 150–400)
RBC: 2.13 MIL/uL — ABNORMAL LOW (ref 3.87–5.11)

## 2012-07-01 LAB — PREPARE RBC (CROSSMATCH)

## 2012-07-01 MED ORDER — ENOXAPARIN SODIUM 40 MG/0.4ML ~~LOC~~ SOLN: 40.0000 mg | SUBCUTANEOUS | Status: DC

## 2012-07-01 MED ORDER — ACETAMINOPHEN 325 MG PO TABS
650.0000 mg | ORAL_TABLET | Freq: Once | ORAL | Status: AC
  Administered 2012-07-01: 650 mg via ORAL
  Filled 2012-07-01: qty 2

## 2012-07-01 MED ORDER — DSS 100 MG PO CAPS: 100.0000 mg | ORAL_CAPSULE | Freq: Two times a day (BID) | ORAL | Status: AC

## 2012-07-01 MED ORDER — OXYCODONE HCL 5 MG PO TABS: 5.0000 mg | ORAL_TABLET | ORAL | Status: AC | PRN

## 2012-07-01 MED ORDER — CIPROFLOXACIN HCL 500 MG PO TABS: 500.0000 mg | ORAL_TABLET | Freq: Two times a day (BID) | ORAL | Status: AC

## 2012-07-01 MED ORDER — METHOCARBAMOL 500 MG PO TABS: 500.0000 mg | ORAL_TABLET | Freq: Four times a day (QID) | ORAL | Status: AC | PRN

## 2012-07-01 MED ORDER — DIPHENHYDRAMINE HCL 25 MG PO CAPS
25.0000 mg | ORAL_CAPSULE | Freq: Once | ORAL | Status: AC
  Administered 2012-07-01: 25 mg via ORAL
  Filled 2012-07-01: qty 1

## 2012-07-01 MED ORDER — FUROSEMIDE 10 MG/ML IJ SOLN
10.0000 mg | Freq: Once | INTRAMUSCULAR | Status: DC
  Filled 2012-07-01: qty 1

## 2012-07-01 MED ORDER — POLYETHYLENE GLYCOL 3350 17 G PO PACK: 17.0000 g | PACK | Freq: Two times a day (BID) | ORAL | Status: AC

## 2012-07-01 MED ORDER — FUROSEMIDE 10 MG/ML IJ SOLN
10.0000 mg | Freq: Once | INTRAMUSCULAR | Status: AC
  Administered 2012-07-01: 10 mg via INTRAVENOUS
  Filled 2012-07-01: qty 1

## 2012-07-01 MED ORDER — PROMETHAZINE HCL 12.5 MG PO TABS: 12.5000 mg | ORAL_TABLET | Freq: Four times a day (QID) | ORAL | Status: DC | PRN

## 2012-07-01 MED ORDER — DIPHENHYDRAMINE HCL 25 MG PO CAPS: 25.0000 mg | ORAL_CAPSULE | Freq: Four times a day (QID) | ORAL | Status: DC | PRN

## 2012-07-01 NOTE — Progress Notes (Signed)
Pt d/c to [facility] ( [facility] this afternoon via [facility] transport. Pt / family were aware of d/c plan .  Asher Muir Hyrum Shaneyfelt LCSW [phone]

## 2012-07-01 NOTE — Progress Notes (Signed)
Physical Therapy Treatment Patient Details Name: [name] MRN: [id number] DOB: 1926/09/03 Today's Date: 07/01/2012 Time: 1400-1410 PT Time Calculation (min): 10 min  PT Assessment / Plan / Recommendation Comments on Treatment Session  Pt on 2nd unit of blood but agreeable to exercises.  Pt reports d/c to [facility] after finishing blood.    Follow Up Recommendations  Skilled nursing facility    Barriers to Discharge        Equipment Recommendations  Defer to next venue    Recommendations for Other Services    Frequency     Plan Discharge plan remains appropriate;Frequency remains appropriate    Precautions / Restrictions Precautions Precautions: Posterior Hip Restrictions Weight Bearing Restrictions: Yes LLE Weight Bearing: Partial weight bearing LLE Partial Weight Bearing Percentage or Pounds: 50   Pertinent Vitals/Pain 2/10 L hip pain, RN medicated pt just prior to arrival, declined ice, pillow positioned between knees to help maintain posterior hip precautions    Mobility       Exercises Total Joint Exercises Ankle Circles/Pumps: AROM;Both;20 reps Quad Sets: Strengthening;Both;20 reps Gluteal Sets: Strengthening;Both;20 reps Short Arc Quad[employer]bara[employer] Cox;Left;Supine;15 reps Heel Slides: AAROM;Left;Supine;Limitations;15 reps Heel Slides Limitations: within precautions Hip ABduction/ADduction: AAROM;Left;Supine;15 reps   PT Diagnosis:    PT Problem List:   PT Treatment Interventions:     PT Goals Acute Rehab PT Goals PT Goal: Supine/Side to Sit - Progress: Progressing toward goal  Visit Information  Last PT Received On: 07/01/12 Assistance Needed: +2    Subjective Data  Subjective: Pt receiving 2nd unit of PRBCs   Cognition  Overall Cognitive Status: Appears within functional limits for tasks assessed/performed    Balance     End of Session PT - End of Session Activity Tolerance: Patient tolerated treatment well Patient left: in bed;with call bell/phone  within reach;with family/visitor present   GP     [name],KATHrine E 07/01/2012, 2:28 PM Pager: [phone]

## 2012-07-01 NOTE — Progress Notes (Signed)
Report called in to [facility]. Went over discharge instructions with patient. Patient verbalized understanding. Awaiting ambulance for transportation to [facility].

## 2012-07-01 NOTE — Progress Notes (Signed)
  Subjective: 3 Days Post-Op Procedure(s) (LRB): CONVERSION TO TOTAL HIP (Left)   Patient reports pain as mild, pain well controlled. Little weak and H&H down. Otherwise no other events. Transfuse to units of blood and if feeling good will be ready to be discharged to SNF.  Objective:   VITALS:   Filed Vitals:   07/01/12 0800  BP: 113/65  Pulse: 90  Temp: 97.7 F (36.5 C)   Resp: 16    Neurovascular intact Dorsiflexion/Plantar flexion intact Incision: dressing C/D/I No cellulitis present Compartment soft  LABS  Basename 07/01/12 0435 06/30/12 0418 06/29/12 0412  HGB 6.6* 7.8* 8.2*  HCT 19.5* 23.9* 24.8*  WBC 13.2* 12.8* 13.2*  PLT 381 461* 453*     Basename 06/30/12 0418 06/29/12 0412 06/28/12 0945  NA 132* 129* 133*  K 4.0 5.3* 4.5  BUN 8 13 15   CREATININE 0.43* 0.55 0.57  GLUCOSE 115* 145* 102*     Assessment/Plan: 3 Days Post-Op Procedure(s) (LRB): CONVERSION TO TOTAL HIP (Left)  Transfuse 2 units of blood Up with therapy Discharge to SNF Follow up in 2 weeks at [facility].  Follow-up Information    Follow up with OLIN,Raylon Lamson D in 2 weeks.   Contact information:   [facility] [address] [place] [address] [phone]             Anastasio Auerbach. Laurissa Cowper   PAC  07/01/2012, 9:12 AM

## 2012-07-01 NOTE — Discharge Summary (Signed)
Physician Discharge Summary  Patient ID: JOELEE SNOKE MRN: [id number] DOB/AGE: 76/04/1926 76 y.o.  Admit date: 06/28/2012 Discharge date:  07/01/2012  Procedures:  Procedure(s) (LRB): CONVERSION TO TOTAL HIP (Left)  Attending Physician:  Dr. Durene Romans   Admission Diagnoses:   Left hip pain , post ORIF   Discharge Diagnoses:  Principal Problem:  *S/P left TH revision Active Problems:  Acute blood loss anemia Hyperlipidemia   Mitral regurgitation   Allergic rhinitis   Actinic keratosis   Tenosynovitis - myxomatous   Chronic constipation   GERD (gastroesophageal reflux disease)   Osteoarthritis   Back pain   Macular degeneration   Hypertension  HPI: Pt is a 78 y.o. female complaining of left hip pain. Patient fell from standing height on 06/09/12. She c/o dull aching pain that was mild and constant but gets worse and sharp with motion. Dr Victorino Dike preformed a IM nailing of the left hip on 06/09/12, she tolerated the procedure well. After leaving the hospital she did well at first then, but then started to have pain in the left hip. The pain increased to the point of not being able to walk on the left leg. Dr. Charlann Boxer saw the patient in the office and it was decided that the only way to fix the hip was with a different procedure. Risks, benefits and expectations were discussed with the patient. Patient understand the risks, benefits and expectations and wishes to proceed with conversion of the left IM nailing to a left total hip arthroplasty.   PCP: Londell Moh, MD   Discharged Condition: good  Hospital Course:  Patient underwent the above stated procedure on 06/28/2012. Patient tolerated the procedure well and brought to the recovery room in good condition and subsequently to the floor.  POD #1 BP: 99/44 ; Pulse: 85 ; Temp: 97.9 F (36.6 C) ; Resp: 18  Pt's foley was removed, as well as the hemovac drain removed. IV was changed to a saline lock. Patient reports pain as  mild, pain well controlled. Patient is having some nausea,but otherwise no events. Neurovascular intact, dorsiflexion/plantar flexion intact, incision: dressing C/D/I, no cellulitis present and compartment soft.   LABS  Basename  06/29/12  0412  HGB  8.2  HCT  24.8   POD #2  BP: 141/58 ; Pulse: 96 ; Temp: 98.9 F (37.2 C) ; Resp: 16 Patient reports pain as moderate in left hip area. Limited activity yesterday, limited activity past 2 weeks  Neurovascular intact, dorsiflexion/plantar flexion intact, incision: dressing C/D/I, no cellulitis present and compartment soft.   LABS  Basename  06/30/12  0418  HGB  7.8  HCT  23.9   POD #3  BP: 113/65 ; Pulse: 90 ; Temp: 97.7 F (36.5 C) ; Resp: 16  Patient reports pain as mild, pain well controlled. Little weak and H&H down. Otherwise no other events. Transfuse to units of blood and if feeling good will be ready to be discharged to SNF. Neurovascular intact, dorsiflexion/plantar flexion intact, incision: dressing C/D/I, no cellulitis present and compartment soft.   LABS  Basename  07/01/12 0435   HGB  6.6  HCT  19.5   Discharge Exam: General appearance: alert, cooperative and no distress Extremities: Homans sign is negative, no sign of DVT, no edema, redness or tenderness in the calves or thighs and no ulcers, gangrene or trophic changes  Disposition: Skilled Nursing Facility with follow up in 2 weeks   Follow-up Information    Follow up with [name],  MD. Schedule an appointment as soon as possible for a visit in 2 weeks.   Contact information:   [facility] [address], [address] [place] [address] [phone]          Discharge Orders    Future Orders Please Complete By Expires   Diet - low sodium heart healthy      Call MD / Call 911      Comments:   If you experience chest pain or shortness of breath, CALL 911 and be transported to the hospital emergency room.  If you develope  a fever above 101 F, pus (white drainage) or increased drainage or redness at the wound, or calf pain, call your surgeon's office.   Discharge instructions      Comments:   Daily dressing changes with gauze and tape. Keep the area dry and clean until follow up. Follow up in 2 weeks at [facility]. Call with any questions or concerns.   Constipation Prevention      Comments:   Drink plenty of fluids.  Prune juice may be helpful.  You may use a stool softener, such as Colace (over the counter) 100 mg twice a day.  Use MiraLax (over the counter) for constipation as needed.   Increase activity slowly as tolerated      Driving restrictions      Comments:   No driving for 4 weeks   Change dressing      Comments:   Daily dressing changes with 4x4 guaze and tape. Keep the area dry and clean.   TED hose      Comments:   Use stockings (TED hose) for 2 weeks on both leg(s).  You may remove them at night for sleeping.      Current Discharge Medication List    START taking these medications   Details  ciprofloxacin (CIPRO) 500 MG tablet Take 1 tablet (500 mg total) by mouth 2 (two) times daily. Qty: 10 tablet, Refills: 0    diphenhydrAMINE (BENADRYL) 25 mg capsule Take 1 capsule (25 mg total) by mouth every 6 (six) hours as needed for itching, allergies or sleep. Qty: 30 capsule    docusate sodium 100 MG CAPS Take 100 mg by mouth 2 (two) times daily. Qty: 10 capsule    methocarbamol (ROBAXIN) 500 MG tablet Take 1 tablet (500 mg total) by mouth every 6 (six) hours as needed (muscle spasms). Qty: 50 tablet, Refills: 0    oxyCODONE (OXY IR/ROXICODONE) 5 MG immediate release tablet Take 1-3 tablets (5-15 mg total) by mouth every 4 (four) hours as needed for pain. Qty: 120 tablet, Refills: 0    polyethylene glycol (MIRALAX / GLYCOLAX) packet Take 17 g by mouth 2 (two) times daily. Qty: 14 each    promethazine (PHENERGAN) 12.5 MG tablet Take 1-2 tablets (12.5-25 mg total) by mouth  every 6 (six) hours as needed for nausea. Qty: 30 tablet      CONTINUE these medications which have CHANGED   Details  enoxaparin (LOVENOX) 40 MG/0.4ML injection Inject 0.4 mLs (40 mg total) into the skin See admin instructions. Daily for 2 weeks-stop on 06/27/12 Qty: 14 Syringe, Refills: 0   Comments: For 2 weeks      CONTINUE these medications which have NOT CHANGED   Details  cetirizine (ZYRTEC) 10 MG tablet Take 10 mg by mouth daily.     Cholecalciferol (VITAMIN D3) 1000 UNITS tablet Take 1,000 Units by mouth daily.  esomeprazole (NEXIUM) 40 MG capsule Take 40 mg by mouth daily before breakfast.    ferrous sulfate 325 (65 FE) MG tablet Take 1 tablet (325 mg total) by mouth 3 (three) times daily with meals.    fish oil-omega-3 fatty acids 1000 MG capsule Take 1 g by mouth daily.      Magnesium 250 MG TABS Take 1 tablet by mouth daily.     Multiple Vitamin (MULTIVITAMIN) capsule Take 1 capsule by mouth daily.     Multiple Vitamins-Minerals (PRESERVISION AREDS 2 PO) Take 1 tablet by mouth 2 (two) times daily.     olmesartan (BENICAR) 20 MG tablet Take 1 tablet (20 mg total) by mouth daily.    senna-docusate (SENOKOT-S) 8.6-50 MG per tablet Take 1 tablet by mouth 2 (two) times daily.    trimethoprim (TRIMPEX) 100 MG tablet Take 100 mg by mouth daily.    vitamin B-12 (CYANOCOBALAMIN) 500 MCG tablet Take 500 mcg by mouth daily.     Wheat Dextrin (BENEFIBER DRINK MIX PO) Take by mouth. 2 tsp two times a day      STOP taking these medications     HYDROcodone-acetaminophen (LORTAB) 7.5-500 MG per tablet Comments:  Reason for Stopping:           Signed: Anastasio Auerbach. Becka Lagasse   PAC  07/01/2012, 9:27 AM

## 2012-07-02 LAB — TYPE AND SCREEN
ABO/RH(D): O POS
Unit division: 0

## 2012-07-14 DIAGNOSIS — K219 Gastro-esophageal reflux disease without esophagitis: Secondary | ICD-10-CM | POA: Diagnosis not present

## 2012-07-14 DIAGNOSIS — Z96649 Presence of unspecified artificial hip joint: Secondary | ICD-10-CM | POA: Diagnosis not present

## 2012-07-14 DIAGNOSIS — I10 Essential (primary) hypertension: Secondary | ICD-10-CM | POA: Diagnosis not present

## 2012-08-04 DIAGNOSIS — L270 Generalized skin eruption due to drugs and medicaments taken internally: Secondary | ICD-10-CM | POA: Diagnosis not present

## 2012-08-10 DIAGNOSIS — M25559 Pain in unspecified hip: Secondary | ICD-10-CM | POA: Diagnosis not present

## 2012-08-24 DIAGNOSIS — S72009D Fracture of unspecified part of neck of unspecified femur, subsequent encounter for closed fracture with routine healing: Secondary | ICD-10-CM | POA: Diagnosis not present

## 2012-08-26 DIAGNOSIS — IMO0002 Reserved for concepts with insufficient information to code with codable children: Secondary | ICD-10-CM | POA: Diagnosis not present

## 2012-08-26 DIAGNOSIS — R269 Unspecified abnormalities of gait and mobility: Secondary | ICD-10-CM | POA: Diagnosis not present

## 2012-08-26 DIAGNOSIS — M545 Low back pain: Secondary | ICD-10-CM | POA: Diagnosis not present

## 2012-08-26 DIAGNOSIS — M6281 Muscle weakness (generalized): Secondary | ICD-10-CM | POA: Diagnosis not present

## 2012-08-26 DIAGNOSIS — Z471 Aftercare following joint replacement surgery: Secondary | ICD-10-CM | POA: Diagnosis not present

## 2012-08-30 DIAGNOSIS — IMO0002 Reserved for concepts with insufficient information to code with codable children: Secondary | ICD-10-CM | POA: Diagnosis not present

## 2012-08-30 DIAGNOSIS — R269 Unspecified abnormalities of gait and mobility: Secondary | ICD-10-CM | POA: Diagnosis not present

## 2012-08-30 DIAGNOSIS — Z471 Aftercare following joint replacement surgery: Secondary | ICD-10-CM | POA: Diagnosis not present

## 2012-08-30 DIAGNOSIS — M6281 Muscle weakness (generalized): Secondary | ICD-10-CM | POA: Diagnosis not present

## 2012-08-30 DIAGNOSIS — M545 Low back pain: Secondary | ICD-10-CM | POA: Diagnosis not present

## 2012-08-31 DIAGNOSIS — M545 Low back pain: Secondary | ICD-10-CM | POA: Diagnosis not present

## 2012-08-31 DIAGNOSIS — M6281 Muscle weakness (generalized): Secondary | ICD-10-CM | POA: Diagnosis not present

## 2012-08-31 DIAGNOSIS — Z471 Aftercare following joint replacement surgery: Secondary | ICD-10-CM | POA: Diagnosis not present

## 2012-08-31 DIAGNOSIS — R269 Unspecified abnormalities of gait and mobility: Secondary | ICD-10-CM | POA: Diagnosis not present

## 2012-08-31 DIAGNOSIS — IMO0002 Reserved for concepts with insufficient information to code with codable children: Secondary | ICD-10-CM | POA: Diagnosis not present

## 2012-09-02 DIAGNOSIS — M545 Low back pain: Secondary | ICD-10-CM | POA: Diagnosis not present

## 2012-09-02 DIAGNOSIS — R269 Unspecified abnormalities of gait and mobility: Secondary | ICD-10-CM | POA: Diagnosis not present

## 2012-09-02 DIAGNOSIS — IMO0002 Reserved for concepts with insufficient information to code with codable children: Secondary | ICD-10-CM | POA: Diagnosis not present

## 2012-09-02 DIAGNOSIS — M6281 Muscle weakness (generalized): Secondary | ICD-10-CM | POA: Diagnosis not present

## 2012-09-02 DIAGNOSIS — Z471 Aftercare following joint replacement surgery: Secondary | ICD-10-CM | POA: Diagnosis not present

## 2012-09-06 DIAGNOSIS — IMO0002 Reserved for concepts with insufficient information to code with codable children: Secondary | ICD-10-CM | POA: Diagnosis not present

## 2012-09-06 DIAGNOSIS — Z471 Aftercare following joint replacement surgery: Secondary | ICD-10-CM | POA: Diagnosis not present

## 2012-09-06 DIAGNOSIS — M545 Low back pain: Secondary | ICD-10-CM | POA: Diagnosis not present

## 2012-09-06 DIAGNOSIS — M6281 Muscle weakness (generalized): Secondary | ICD-10-CM | POA: Diagnosis not present

## 2012-09-06 DIAGNOSIS — R269 Unspecified abnormalities of gait and mobility: Secondary | ICD-10-CM | POA: Diagnosis not present

## 2012-09-07 DIAGNOSIS — R269 Unspecified abnormalities of gait and mobility: Secondary | ICD-10-CM | POA: Diagnosis not present

## 2012-09-07 DIAGNOSIS — Z471 Aftercare following joint replacement surgery: Secondary | ICD-10-CM | POA: Diagnosis not present

## 2012-09-07 DIAGNOSIS — M6281 Muscle weakness (generalized): Secondary | ICD-10-CM | POA: Diagnosis not present

## 2012-09-07 DIAGNOSIS — IMO0002 Reserved for concepts with insufficient information to code with codable children: Secondary | ICD-10-CM | POA: Diagnosis not present

## 2012-09-07 DIAGNOSIS — M545 Low back pain: Secondary | ICD-10-CM | POA: Diagnosis not present

## 2012-09-08 ENCOUNTER — Other Ambulatory Visit: Payer: Self-pay | Admitting: Rheumatology

## 2012-09-08 DIAGNOSIS — M7989 Other specified soft tissue disorders: Secondary | ICD-10-CM | POA: Diagnosis not present

## 2012-09-08 DIAGNOSIS — M79606 Pain in leg, unspecified: Secondary | ICD-10-CM

## 2012-09-08 DIAGNOSIS — Z471 Aftercare following joint replacement surgery: Secondary | ICD-10-CM | POA: Diagnosis not present

## 2012-09-08 DIAGNOSIS — M6281 Muscle weakness (generalized): Secondary | ICD-10-CM | POA: Diagnosis not present

## 2012-09-08 DIAGNOSIS — R269 Unspecified abnormalities of gait and mobility: Secondary | ICD-10-CM | POA: Diagnosis not present

## 2012-09-08 DIAGNOSIS — M545 Low back pain: Secondary | ICD-10-CM | POA: Diagnosis not present

## 2012-09-08 DIAGNOSIS — M79609 Pain in unspecified limb: Secondary | ICD-10-CM | POA: Diagnosis not present

## 2012-09-08 DIAGNOSIS — IMO0002 Reserved for concepts with insufficient information to code with codable children: Secondary | ICD-10-CM | POA: Diagnosis not present

## 2012-09-09 ENCOUNTER — Ambulatory Visit
Admission: RE | Admit: 2012-09-09 | Discharge: 2012-09-09 | Disposition: A | Discharge status: Home / Self Care | Payer: Medicare Other | Source: Ambulatory Visit | Attending: Rheumatology | Admitting: Rheumatology

## 2012-09-09 DIAGNOSIS — M79606 Pain in leg, unspecified: Secondary | ICD-10-CM

## 2012-09-09 DIAGNOSIS — L03039 Cellulitis of unspecified toe: Secondary | ICD-10-CM | POA: Diagnosis not present

## 2012-09-09 DIAGNOSIS — M79609 Pain in unspecified limb: Secondary | ICD-10-CM | POA: Diagnosis not present

## 2012-09-12 DIAGNOSIS — M6281 Muscle weakness (generalized): Secondary | ICD-10-CM | POA: Diagnosis not present

## 2012-09-12 DIAGNOSIS — Z471 Aftercare following joint replacement surgery: Secondary | ICD-10-CM | POA: Diagnosis not present

## 2012-09-12 DIAGNOSIS — M545 Low back pain: Secondary | ICD-10-CM | POA: Diagnosis not present

## 2012-09-12 DIAGNOSIS — IMO0002 Reserved for concepts with insufficient information to code with codable children: Secondary | ICD-10-CM | POA: Diagnosis not present

## 2012-09-12 DIAGNOSIS — R269 Unspecified abnormalities of gait and mobility: Secondary | ICD-10-CM | POA: Diagnosis not present

## 2012-09-14 DIAGNOSIS — Z471 Aftercare following joint replacement surgery: Secondary | ICD-10-CM | POA: Diagnosis not present

## 2012-09-14 DIAGNOSIS — R269 Unspecified abnormalities of gait and mobility: Secondary | ICD-10-CM | POA: Diagnosis not present

## 2012-09-14 DIAGNOSIS — M545 Low back pain: Secondary | ICD-10-CM | POA: Diagnosis not present

## 2012-09-14 DIAGNOSIS — M6281 Muscle weakness (generalized): Secondary | ICD-10-CM | POA: Diagnosis not present

## 2012-09-14 DIAGNOSIS — IMO0002 Reserved for concepts with insufficient information to code with codable children: Secondary | ICD-10-CM | POA: Diagnosis not present

## 2012-09-19 DIAGNOSIS — Z9660 Presence of unspecified orthopedic joint implant: Secondary | ICD-10-CM | POA: Diagnosis not present

## 2012-09-19 DIAGNOSIS — T84049A Periprosthetic fracture around unspecified internal prosthetic joint, initial encounter: Secondary | ICD-10-CM | POA: Diagnosis not present

## 2012-09-20 DIAGNOSIS — I10 Essential (primary) hypertension: Secondary | ICD-10-CM | POA: Diagnosis not present

## 2012-09-20 DIAGNOSIS — R609 Edema, unspecified: Secondary | ICD-10-CM | POA: Diagnosis not present

## 2012-09-21 DIAGNOSIS — M6281 Muscle weakness (generalized): Secondary | ICD-10-CM | POA: Diagnosis not present

## 2012-09-21 DIAGNOSIS — IMO0002 Reserved for concepts with insufficient information to code with codable children: Secondary | ICD-10-CM | POA: Diagnosis not present

## 2012-09-21 DIAGNOSIS — M545 Low back pain: Secondary | ICD-10-CM | POA: Diagnosis not present

## 2012-09-21 DIAGNOSIS — Z471 Aftercare following joint replacement surgery: Secondary | ICD-10-CM | POA: Diagnosis not present

## 2012-09-21 DIAGNOSIS — R269 Unspecified abnormalities of gait and mobility: Secondary | ICD-10-CM | POA: Diagnosis not present

## 2012-09-22 DIAGNOSIS — H353290 Exudative age-related macular degeneration, unspecified eye, stage unspecified: Secondary | ICD-10-CM | POA: Diagnosis not present

## 2012-09-24 DIAGNOSIS — IMO0002 Reserved for concepts with insufficient information to code with codable children: Secondary | ICD-10-CM | POA: Diagnosis not present

## 2012-09-24 DIAGNOSIS — M6281 Muscle weakness (generalized): Secondary | ICD-10-CM | POA: Diagnosis not present

## 2012-09-24 DIAGNOSIS — Z471 Aftercare following joint replacement surgery: Secondary | ICD-10-CM | POA: Diagnosis not present

## 2012-09-24 DIAGNOSIS — R269 Unspecified abnormalities of gait and mobility: Secondary | ICD-10-CM | POA: Diagnosis not present

## 2012-09-24 DIAGNOSIS — M545 Low back pain: Secondary | ICD-10-CM | POA: Diagnosis not present

## 2012-10-04 DIAGNOSIS — R609 Edema, unspecified: Secondary | ICD-10-CM | POA: Diagnosis not present

## 2012-10-04 DIAGNOSIS — Z79899 Other long term (current) drug therapy: Secondary | ICD-10-CM | POA: Diagnosis not present

## 2012-10-10 DIAGNOSIS — R609 Edema, unspecified: Secondary | ICD-10-CM | POA: Diagnosis not present

## 2012-10-10 DIAGNOSIS — Z79899 Other long term (current) drug therapy: Secondary | ICD-10-CM | POA: Diagnosis not present

## 2012-11-01 DIAGNOSIS — M25519 Pain in unspecified shoulder: Secondary | ICD-10-CM | POA: Diagnosis not present

## 2012-11-01 DIAGNOSIS — M1990 Unspecified osteoarthritis, unspecified site: Secondary | ICD-10-CM | POA: Diagnosis not present

## 2012-11-10 DIAGNOSIS — H353290 Exudative age-related macular degeneration, unspecified eye, stage unspecified: Secondary | ICD-10-CM | POA: Diagnosis not present

## 2012-11-15 DIAGNOSIS — I10 Essential (primary) hypertension: Secondary | ICD-10-CM | POA: Diagnosis not present

## 2012-11-17 DIAGNOSIS — I10 Essential (primary) hypertension: Secondary | ICD-10-CM | POA: Diagnosis not present

## 2012-11-17 DIAGNOSIS — E871 Hypo-osmolality and hyponatremia: Secondary | ICD-10-CM | POA: Diagnosis not present

## 2012-11-17 DIAGNOSIS — R609 Edema, unspecified: Secondary | ICD-10-CM | POA: Diagnosis not present

## 2012-12-15 DIAGNOSIS — M1990 Unspecified osteoarthritis, unspecified site: Secondary | ICD-10-CM | POA: Diagnosis not present

## 2012-12-15 DIAGNOSIS — M25519 Pain in unspecified shoulder: Secondary | ICD-10-CM | POA: Diagnosis not present

## 2013-01-05 DIAGNOSIS — H353290 Exudative age-related macular degeneration, unspecified eye, stage unspecified: Secondary | ICD-10-CM | POA: Diagnosis not present

## 2013-01-06 DIAGNOSIS — K625 Hemorrhage of anus and rectum: Secondary | ICD-10-CM | POA: Diagnosis not present

## 2013-01-06 DIAGNOSIS — B373 Candidiasis of vulva and vagina: Secondary | ICD-10-CM | POA: Diagnosis not present

## 2013-01-06 DIAGNOSIS — L538 Other specified erythematous conditions: Secondary | ICD-10-CM | POA: Diagnosis not present

## 2013-01-06 DIAGNOSIS — K644 Residual hemorrhoidal skin tags: Secondary | ICD-10-CM | POA: Diagnosis not present

## 2013-01-10 ENCOUNTER — Encounter (INDEPENDENT_AMBULATORY_CARE_PROVIDER_SITE_OTHER): Payer: Self-pay

## 2013-01-24 ENCOUNTER — Ambulatory Visit (INDEPENDENT_AMBULATORY_CARE_PROVIDER_SITE_OTHER): Payer: Medicare Other | Admitting: General Surgery

## 2013-01-24 ENCOUNTER — Encounter (INDEPENDENT_AMBULATORY_CARE_PROVIDER_SITE_OTHER): Payer: Self-pay | Admitting: General Surgery

## 2013-01-24 VITALS — BP 178/88 | HR 106 | Temp 99.0°F | Resp 18 | Ht 61.0 in | Wt 161.0 lb

## 2013-01-24 DIAGNOSIS — K645 Perianal venous thrombosis: Secondary | ICD-10-CM | POA: Insufficient documentation

## 2013-01-24 NOTE — Patient Instructions (Signed)
You have a resolving thrombosed external hemorrhoid on the left side. Apply witch hazel to this area 2-4 times a day until the swelling decreases. Continue to use the hemorrhoid medicine. Clean the area after bowel movements with warm water on very soft tissue paper.

## 2013-01-24 NOTE — Progress Notes (Signed)
Patient ID: [name], female   DOB: 1926-05-14, 79 y.o.   MRN: [id number]  No chief complaint on file.   HPI [name] is a 79 y.o. female.   HPI  She is referred by Dr. Renne Crigler for further evaluation and treatment of bleeding external hemorrhoids. She had an episode very early this month where she was having some rectal bleeding after bowel movements. She saw him and was put on some hemorrhoid medication. Since that time she has improved and now no longer has any bleeding. She stated that the process started when she felt a firm, hard, painful swelling in the anal area prior to the bleeding.  Past Medical History  Diagnosis Date  . Hyperlipidemia   . Mitral regurgitation     mild by echo 2003  . Allergic rhinitis   . Actinic keratosis   . Tenosynovitis     myxomatous  . Chronic constipation   . GERD (gastroesophageal reflux disease)   . Osteoarthritis   . Back pain   . Macular degeneration   . Hypertension   . Osteoarthrosis   . Osteoporosis   . Hyponatremia   . Leg edema     Past Surgical History  Procedure Laterality Date  . Synovectomy      of right long finger  . Hernial repain    . Inguinal hernia repair      laparoscopic preperitoneal repair of bilateral inguinal hernias  . Carpal tunnel release      bilateral  . Replacement total knee bilateral    . Tonisellectomy    . Tonisillectomy    . Tonsillectomy      Family History  Problem Relation Age of Onset  . Heart attack Father   . Heart attack Brother     Social History History  Substance Use Topics  . Smoking status: Former Smoker -- 1.00 packs/day for 2 years    Types: Cigarettes  . Smokeless tobacco: Not on file  . Alcohol Use: 1.2 oz/week    2 Glasses of wine per week     Comment: 2 glasses per week    Allergies  Allergen Reactions  . Aspirin     Unknown  . Boniva (Ibandronic Acid)     Unknown  . Morphine     Unknown  . Nitrofuran Derivatives     Unknown  . Penicillins    Unknown  . Sulfonamide Derivatives     Unknown    Current Outpatient Prescriptions  Medication Sig Dispense Refill  . cetirizine (ZYRTEC) 10 MG tablet Take 10 mg by mouth daily.       . Cholecalciferol (VITAMIN D3) 1000 UNITS tablet Take 1,000 Units by mouth daily.       Marland Kitchen enoxaparin (LOVENOX) 40 MG/0.4ML injection Inject 0.4 mLs (40 mg total) into the skin See admin instructions. Daily for 2 weeks-stop on 06/27/12  14 Syringe  0  . esomeprazole (NEXIUM) 40 MG capsule Take 40 mg by mouth daily before breakfast.      . ferrous sulfate 325 (65 FE) MG tablet Take 1 tablet (325 mg total) by mouth 3 (three) times daily with meals.      . fish oil-omega-3 fatty acids 1000 MG capsule Take 1 g by mouth daily.        . Magnesium 250 MG TABS Take 1 tablet by mouth daily.       . Multiple Vitamin (MULTIVITAMIN) capsule Take 1 capsule by mouth daily.       Marland Kitchen  Multiple Vitamins-Minerals (PRESERVISION AREDS 2 PO) Take 1 tablet by mouth 2 (two) times daily.       Marland Kitchen olmesartan (BENICAR) 20 MG tablet Take 1 tablet (20 mg total) by mouth daily.      . Ranibizumab (LUCENTIS IO) Inject into the eye. Injection in right eye      . senna-docusate (SENOKOT-S) 8.6-50 MG per tablet Take 1 tablet by mouth 2 (two) times daily.      Marland Kitchen trimethoprim (TRIMPEX) 100 MG tablet Take 100 mg by mouth daily.      . vitamin B-12 (CYANOCOBALAMIN) 500 MCG tablet Take 500 mcg by mouth daily.       . Wheat Dextrin (BENEFIBER DRINK MIX PO) Take by mouth. 2 tsp two times a day      . diphenhydrAMINE (BENADRYL) 25 mg capsule Take 1 capsule (25 mg total) by mouth every 6 (six) hours as needed for itching, allergies or sleep.  30 capsule    . promethazine (PHENERGAN) 12.5 MG tablet Take 1-2 tablets (12.5-25 mg total) by mouth every 6 (six) hours as needed for nausea.  30 tablet     No current facility-administered medications for this visit.    Review of Systems Review of Systems  Cardiovascular: Negative.   Gastrointestinal: Positive  for constipation and anal bleeding.    Blood pressure 178/88, pulse 106, temperature 99 F (37.2 C), temperature source Temporal, resp. rate 18, height 5\' 1"  (1.549 m), weight 161 lb (73.029 kg).  Physical Exam Physical Exam  Constitutional:  Elderly female in NAD.  Genitourinary:  Left-sided external swelling with no redness and no tenderness. No fissure. No mass on digital rectal exam.  Anoscopy demonstrates a small internal hemorrhoid left lateral position. No mass.  Musculoskeletal:  Limited mobility-uses rolling walker.    Data Reviewed Notes from Dr. Renne Crigler  Assessment    Resolving thrombosed external hemorrhoid     Plan    Apply witch hazel to area 2-4 times a day. Continue using hemorrhoidal cream. No need for any procedures at this time. Return visit as needed.        Clide Remmers J 01/24/2013, 2:28 PM

## 2013-02-16 DIAGNOSIS — M19019 Primary osteoarthritis, unspecified shoulder: Secondary | ICD-10-CM | POA: Diagnosis not present

## 2013-02-16 DIAGNOSIS — M25519 Pain in unspecified shoulder: Secondary | ICD-10-CM | POA: Diagnosis not present

## 2013-02-28 DIAGNOSIS — Z1231 Encounter for screening mammogram for malignant neoplasm of breast: Secondary | ICD-10-CM | POA: Diagnosis not present

## 2013-03-02 DIAGNOSIS — H353290 Exudative age-related macular degeneration, unspecified eye, stage unspecified: Secondary | ICD-10-CM | POA: Diagnosis not present

## 2013-04-11 DIAGNOSIS — Z0000 Encounter for general adult medical examination without abnormal findings: Secondary | ICD-10-CM | POA: Diagnosis not present

## 2013-04-11 DIAGNOSIS — E7800 Pure hypercholesterolemia, unspecified: Secondary | ICD-10-CM | POA: Diagnosis not present

## 2013-04-11 DIAGNOSIS — I10 Essential (primary) hypertension: Secondary | ICD-10-CM | POA: Diagnosis not present

## 2013-04-11 DIAGNOSIS — Z79899 Other long term (current) drug therapy: Secondary | ICD-10-CM | POA: Diagnosis not present

## 2013-04-11 DIAGNOSIS — N390 Urinary tract infection, site not specified: Secondary | ICD-10-CM | POA: Diagnosis not present

## 2013-04-12 DIAGNOSIS — N3020 Other chronic cystitis without hematuria: Secondary | ICD-10-CM | POA: Diagnosis not present

## 2013-04-13 DIAGNOSIS — M810 Age-related osteoporosis without current pathological fracture: Secondary | ICD-10-CM | POA: Diagnosis not present

## 2013-04-18 DIAGNOSIS — M19019 Primary osteoarthritis, unspecified shoulder: Secondary | ICD-10-CM | POA: Diagnosis not present

## 2013-04-18 DIAGNOSIS — J309 Allergic rhinitis, unspecified: Secondary | ICD-10-CM | POA: Diagnosis not present

## 2013-04-18 DIAGNOSIS — M159 Polyosteoarthritis, unspecified: Secondary | ICD-10-CM | POA: Diagnosis not present

## 2013-04-18 DIAGNOSIS — I10 Essential (primary) hypertension: Secondary | ICD-10-CM | POA: Diagnosis not present

## 2013-04-18 DIAGNOSIS — M810 Age-related osteoporosis without current pathological fracture: Secondary | ICD-10-CM | POA: Diagnosis not present

## 2013-04-20 DIAGNOSIS — M25519 Pain in unspecified shoulder: Secondary | ICD-10-CM | POA: Diagnosis not present

## 2013-04-20 DIAGNOSIS — M19019 Primary osteoarthritis, unspecified shoulder: Secondary | ICD-10-CM | POA: Diagnosis not present

## 2013-04-27 DIAGNOSIS — H353290 Exudative age-related macular degeneration, unspecified eye, stage unspecified: Secondary | ICD-10-CM | POA: Diagnosis not present

## 2013-05-23 DIAGNOSIS — Z79899 Other long term (current) drug therapy: Secondary | ICD-10-CM | POA: Diagnosis not present

## 2013-05-23 DIAGNOSIS — E871 Hypo-osmolality and hyponatremia: Secondary | ICD-10-CM | POA: Diagnosis not present

## 2013-05-30 DIAGNOSIS — Z79899 Other long term (current) drug therapy: Secondary | ICD-10-CM | POA: Diagnosis not present

## 2013-05-30 DIAGNOSIS — E871 Hypo-osmolality and hyponatremia: Secondary | ICD-10-CM | POA: Diagnosis not present

## 2013-06-07 DIAGNOSIS — E871 Hypo-osmolality and hyponatremia: Secondary | ICD-10-CM | POA: Diagnosis not present

## 2013-06-09 DIAGNOSIS — E871 Hypo-osmolality and hyponatremia: Secondary | ICD-10-CM | POA: Diagnosis not present

## 2013-06-20 DIAGNOSIS — D485 Neoplasm of uncertain behavior of skin: Secondary | ICD-10-CM | POA: Diagnosis not present

## 2013-06-20 DIAGNOSIS — L570 Actinic keratosis: Secondary | ICD-10-CM | POA: Diagnosis not present

## 2013-06-21 DIAGNOSIS — E871 Hypo-osmolality and hyponatremia: Secondary | ICD-10-CM | POA: Diagnosis not present

## 2013-07-06 DIAGNOSIS — H353290 Exudative age-related macular degeneration, unspecified eye, stage unspecified: Secondary | ICD-10-CM | POA: Diagnosis not present

## 2013-07-27 DIAGNOSIS — M25519 Pain in unspecified shoulder: Secondary | ICD-10-CM | POA: Diagnosis not present

## 2013-07-27 DIAGNOSIS — M19019 Primary osteoarthritis, unspecified shoulder: Secondary | ICD-10-CM | POA: Diagnosis not present

## 2013-08-14 DIAGNOSIS — H353190 Nonexudative age-related macular degeneration, unspecified eye, stage unspecified: Secondary | ICD-10-CM | POA: Diagnosis not present

## 2013-08-14 DIAGNOSIS — Z961 Presence of intraocular lens: Secondary | ICD-10-CM | POA: Diagnosis not present

## 2013-08-14 DIAGNOSIS — H31019 Macula scars of posterior pole (postinflammatory) (post-traumatic), unspecified eye: Secondary | ICD-10-CM | POA: Diagnosis not present

## 2013-08-14 DIAGNOSIS — Z23 Encounter for immunization: Secondary | ICD-10-CM | POA: Diagnosis not present

## 2013-08-14 DIAGNOSIS — H2510 Age-related nuclear cataract, unspecified eye: Secondary | ICD-10-CM | POA: Diagnosis not present

## 2013-08-28 DIAGNOSIS — B373 Candidiasis of vulva and vagina: Secondary | ICD-10-CM | POA: Diagnosis not present

## 2013-08-31 DIAGNOSIS — N390 Urinary tract infection, site not specified: Secondary | ICD-10-CM | POA: Diagnosis not present

## 2013-08-31 DIAGNOSIS — H2510 Age-related nuclear cataract, unspecified eye: Secondary | ICD-10-CM | POA: Diagnosis not present

## 2013-08-31 DIAGNOSIS — R350 Frequency of micturition: Secondary | ICD-10-CM | POA: Diagnosis not present

## 2013-08-31 DIAGNOSIS — H31019 Macula scars of posterior pole (postinflammatory) (post-traumatic), unspecified eye: Secondary | ICD-10-CM | POA: Diagnosis not present

## 2013-08-31 DIAGNOSIS — H353290 Exudative age-related macular degeneration, unspecified eye, stage unspecified: Secondary | ICD-10-CM | POA: Diagnosis not present

## 2013-08-31 DIAGNOSIS — Z961 Presence of intraocular lens: Secondary | ICD-10-CM | POA: Diagnosis not present

## 2013-09-21 DIAGNOSIS — M48061 Spinal stenosis, lumbar region without neurogenic claudication: Secondary | ICD-10-CM | POA: Diagnosis not present

## 2013-09-21 DIAGNOSIS — G894 Chronic pain syndrome: Secondary | ICD-10-CM | POA: Diagnosis not present

## 2013-09-21 DIAGNOSIS — M5137 Other intervertebral disc degeneration, lumbosacral region: Secondary | ICD-10-CM | POA: Diagnosis not present

## 2013-09-21 DIAGNOSIS — Z79899 Other long term (current) drug therapy: Secondary | ICD-10-CM | POA: Diagnosis not present

## 2013-09-28 ENCOUNTER — Other Ambulatory Visit: Payer: Self-pay | Admitting: Pain Medicine

## 2013-09-28 DIAGNOSIS — M79604 Pain in right leg: Secondary | ICD-10-CM

## 2013-09-28 DIAGNOSIS — M545 Low back pain, unspecified: Secondary | ICD-10-CM

## 2013-09-28 DIAGNOSIS — M1990 Unspecified osteoarthritis, unspecified site: Secondary | ICD-10-CM

## 2013-10-11 ENCOUNTER — Ambulatory Visit
Admission: RE | Admit: 2013-10-11 | Discharge: 2013-10-11 | Disposition: A | Discharge status: Home / Self Care | Payer: Private Health Insurance - Indemnity | Source: Ambulatory Visit | Attending: Pain Medicine | Admitting: Pain Medicine

## 2013-10-11 DIAGNOSIS — M5126 Other intervertebral disc displacement, lumbar region: Secondary | ICD-10-CM | POA: Diagnosis not present

## 2013-10-11 DIAGNOSIS — M47817 Spondylosis without myelopathy or radiculopathy, lumbosacral region: Secondary | ICD-10-CM | POA: Diagnosis not present

## 2013-10-11 DIAGNOSIS — M545 Low back pain: Secondary | ICD-10-CM

## 2013-10-11 DIAGNOSIS — M79604 Pain in right leg: Secondary | ICD-10-CM

## 2013-10-11 DIAGNOSIS — M1990 Unspecified osteoarthritis, unspecified site: Secondary | ICD-10-CM

## 2013-10-19 DIAGNOSIS — H31019 Macula scars of posterior pole (postinflammatory) (post-traumatic), unspecified eye: Secondary | ICD-10-CM | POA: Diagnosis not present

## 2013-10-19 DIAGNOSIS — Z961 Presence of intraocular lens: Secondary | ICD-10-CM | POA: Diagnosis not present

## 2013-10-19 DIAGNOSIS — H2510 Age-related nuclear cataract, unspecified eye: Secondary | ICD-10-CM | POA: Diagnosis not present

## 2013-10-19 DIAGNOSIS — H353290 Exudative age-related macular degeneration, unspecified eye, stage unspecified: Secondary | ICD-10-CM | POA: Diagnosis not present

## 2013-10-20 DIAGNOSIS — Z79899 Other long term (current) drug therapy: Secondary | ICD-10-CM | POA: Diagnosis not present

## 2013-10-20 DIAGNOSIS — G894 Chronic pain syndrome: Secondary | ICD-10-CM | POA: Diagnosis not present

## 2013-10-20 DIAGNOSIS — G5790 Unspecified mononeuropathy of unspecified lower limb: Secondary | ICD-10-CM | POA: Diagnosis not present

## 2013-10-20 DIAGNOSIS — M48061 Spinal stenosis, lumbar region without neurogenic claudication: Secondary | ICD-10-CM | POA: Diagnosis not present

## 2013-10-25 DIAGNOSIS — M19019 Primary osteoarthritis, unspecified shoulder: Secondary | ICD-10-CM | POA: Diagnosis not present

## 2013-10-25 DIAGNOSIS — M25519 Pain in unspecified shoulder: Secondary | ICD-10-CM | POA: Diagnosis not present

## 2013-11-13 DIAGNOSIS — M48062 Spinal stenosis, lumbar region with neurogenic claudication: Secondary | ICD-10-CM | POA: Diagnosis not present

## 2013-11-13 DIAGNOSIS — M5137 Other intervertebral disc degeneration, lumbosacral region: Secondary | ICD-10-CM | POA: Diagnosis not present

## 2013-11-13 DIAGNOSIS — M546 Pain in thoracic spine: Secondary | ICD-10-CM | POA: Diagnosis not present

## 2013-12-14 DIAGNOSIS — Z961 Presence of intraocular lens: Secondary | ICD-10-CM | POA: Diagnosis not present

## 2013-12-14 DIAGNOSIS — H31019 Macula scars of posterior pole (postinflammatory) (post-traumatic), unspecified eye: Secondary | ICD-10-CM | POA: Diagnosis not present

## 2013-12-14 DIAGNOSIS — H2510 Age-related nuclear cataract, unspecified eye: Secondary | ICD-10-CM | POA: Diagnosis not present

## 2013-12-14 DIAGNOSIS — H353290 Exudative age-related macular degeneration, unspecified eye, stage unspecified: Secondary | ICD-10-CM | POA: Diagnosis not present

## 2013-12-19 DIAGNOSIS — M4120 Other idiopathic scoliosis, site unspecified: Secondary | ICD-10-CM | POA: Diagnosis not present

## 2013-12-19 DIAGNOSIS — Z79899 Other long term (current) drug therapy: Secondary | ICD-10-CM | POA: Diagnosis not present

## 2013-12-19 DIAGNOSIS — G894 Chronic pain syndrome: Secondary | ICD-10-CM | POA: Diagnosis not present

## 2013-12-19 DIAGNOSIS — M48062 Spinal stenosis, lumbar region with neurogenic claudication: Secondary | ICD-10-CM | POA: Diagnosis not present

## 2014-01-15 DIAGNOSIS — M25519 Pain in unspecified shoulder: Secondary | ICD-10-CM | POA: Diagnosis not present

## 2014-01-15 DIAGNOSIS — M1990 Unspecified osteoarthritis, unspecified site: Secondary | ICD-10-CM | POA: Diagnosis not present

## 2014-01-30 DIAGNOSIS — M25519 Pain in unspecified shoulder: Secondary | ICD-10-CM | POA: Diagnosis not present

## 2014-01-30 DIAGNOSIS — M1990 Unspecified osteoarthritis, unspecified site: Secondary | ICD-10-CM | POA: Diagnosis not present

## 2014-02-08 DIAGNOSIS — H353290 Exudative age-related macular degeneration, unspecified eye, stage unspecified: Secondary | ICD-10-CM | POA: Diagnosis not present

## 2014-02-15 DIAGNOSIS — IMO0001 Reserved for inherently not codable concepts without codable children: Secondary | ICD-10-CM | POA: Diagnosis not present

## 2014-02-15 DIAGNOSIS — M48062 Spinal stenosis, lumbar region with neurogenic claudication: Secondary | ICD-10-CM | POA: Diagnosis not present

## 2014-02-15 DIAGNOSIS — M1990 Unspecified osteoarthritis, unspecified site: Secondary | ICD-10-CM | POA: Diagnosis not present

## 2014-02-15 DIAGNOSIS — G894 Chronic pain syndrome: Secondary | ICD-10-CM | POA: Diagnosis not present

## 2014-02-20 DIAGNOSIS — M169 Osteoarthritis of hip, unspecified: Secondary | ICD-10-CM | POA: Diagnosis not present

## 2014-02-20 DIAGNOSIS — M25559 Pain in unspecified hip: Secondary | ICD-10-CM | POA: Diagnosis not present

## 2014-02-20 DIAGNOSIS — M1610 Unilateral primary osteoarthritis, unspecified hip: Secondary | ICD-10-CM | POA: Diagnosis not present

## 2014-02-20 DIAGNOSIS — M5137 Other intervertebral disc degeneration, lumbosacral region: Secondary | ICD-10-CM | POA: Diagnosis not present

## 2014-03-07 DIAGNOSIS — M25559 Pain in unspecified hip: Secondary | ICD-10-CM | POA: Diagnosis not present

## 2014-03-27 DIAGNOSIS — M5137 Other intervertebral disc degeneration, lumbosacral region: Secondary | ICD-10-CM | POA: Diagnosis not present

## 2014-04-05 DIAGNOSIS — H353290 Exudative age-related macular degeneration, unspecified eye, stage unspecified: Secondary | ICD-10-CM | POA: Diagnosis not present

## 2014-04-11 DIAGNOSIS — N952 Postmenopausal atrophic vaginitis: Secondary | ICD-10-CM | POA: Diagnosis not present

## 2014-04-11 DIAGNOSIS — R3915 Urgency of urination: Secondary | ICD-10-CM | POA: Diagnosis not present

## 2014-04-11 DIAGNOSIS — N3020 Other chronic cystitis without hematuria: Secondary | ICD-10-CM | POA: Diagnosis not present

## 2014-04-12 DIAGNOSIS — IMO0001 Reserved for inherently not codable concepts without codable children: Secondary | ICD-10-CM | POA: Diagnosis not present

## 2014-04-12 DIAGNOSIS — R609 Edema, unspecified: Secondary | ICD-10-CM | POA: Diagnosis not present

## 2014-04-12 DIAGNOSIS — M5137 Other intervertebral disc degeneration, lumbosacral region: Secondary | ICD-10-CM | POA: Diagnosis not present

## 2014-04-12 DIAGNOSIS — G894 Chronic pain syndrome: Secondary | ICD-10-CM | POA: Diagnosis not present

## 2014-04-12 DIAGNOSIS — M47817 Spondylosis without myelopathy or radiculopathy, lumbosacral region: Secondary | ICD-10-CM | POA: Diagnosis not present

## 2014-04-19 DIAGNOSIS — N390 Urinary tract infection, site not specified: Secondary | ICD-10-CM | POA: Diagnosis not present

## 2014-04-19 DIAGNOSIS — R300 Dysuria: Secondary | ICD-10-CM | POA: Diagnosis not present

## 2014-04-19 DIAGNOSIS — I10 Essential (primary) hypertension: Secondary | ICD-10-CM | POA: Diagnosis not present

## 2014-04-19 DIAGNOSIS — M810 Age-related osteoporosis without current pathological fracture: Secondary | ICD-10-CM | POA: Diagnosis not present

## 2014-04-26 DIAGNOSIS — E871 Hypo-osmolality and hyponatremia: Secondary | ICD-10-CM | POA: Diagnosis not present

## 2014-04-26 DIAGNOSIS — R609 Edema, unspecified: Secondary | ICD-10-CM | POA: Diagnosis not present

## 2014-04-26 DIAGNOSIS — I10 Essential (primary) hypertension: Secondary | ICD-10-CM | POA: Diagnosis not present

## 2014-05-03 DIAGNOSIS — M25519 Pain in unspecified shoulder: Secondary | ICD-10-CM | POA: Diagnosis not present

## 2014-05-03 DIAGNOSIS — M810 Age-related osteoporosis without current pathological fracture: Secondary | ICD-10-CM | POA: Diagnosis not present

## 2014-05-03 DIAGNOSIS — M19019 Primary osteoarthritis, unspecified shoulder: Secondary | ICD-10-CM | POA: Diagnosis not present

## 2014-05-05 ENCOUNTER — Emergency Department (HOSPITAL_COMMUNITY): Payer: Medicare Other

## 2014-05-05 ENCOUNTER — Inpatient Hospital Stay (HOSPITAL_COMMUNITY)
Admission: EM | Admit: 2014-05-05 | Discharge: 2014-05-10 | DRG: 483 | Disposition: A | Discharge status: Skilled Nursing Facility | Payer: Medicare Other | Attending: Internal Medicine | Admitting: Internal Medicine

## 2014-05-05 ENCOUNTER — Encounter (HOSPITAL_COMMUNITY): Payer: Self-pay | Admitting: Emergency Medicine

## 2014-05-05 DIAGNOSIS — S51009A Unspecified open wound of unspecified elbow, initial encounter: Secondary | ICD-10-CM | POA: Diagnosis not present

## 2014-05-05 DIAGNOSIS — Z96659 Presence of unspecified artificial knee joint: Secondary | ICD-10-CM

## 2014-05-05 DIAGNOSIS — E785 Hyperlipidemia, unspecified: Secondary | ICD-10-CM | POA: Diagnosis present

## 2014-05-05 DIAGNOSIS — K449 Diaphragmatic hernia without obstruction or gangrene: Secondary | ICD-10-CM | POA: Diagnosis present

## 2014-05-05 DIAGNOSIS — D62 Acute posthemorrhagic anemia: Secondary | ICD-10-CM

## 2014-05-05 DIAGNOSIS — R42 Dizziness and giddiness: Secondary | ICD-10-CM | POA: Diagnosis present

## 2014-05-05 DIAGNOSIS — M810 Age-related osteoporosis without current pathological fracture: Secondary | ICD-10-CM | POA: Diagnosis present

## 2014-05-05 DIAGNOSIS — R112 Nausea with vomiting, unspecified: Secondary | ICD-10-CM | POA: Diagnosis not present

## 2014-05-05 DIAGNOSIS — E871 Hypo-osmolality and hyponatremia: Secondary | ICD-10-CM | POA: Diagnosis not present

## 2014-05-05 DIAGNOSIS — M25529 Pain in unspecified elbow: Secondary | ICD-10-CM | POA: Diagnosis not present

## 2014-05-05 DIAGNOSIS — S42309A Unspecified fracture of shaft of humerus, unspecified arm, initial encounter for closed fracture: Secondary | ICD-10-CM

## 2014-05-05 DIAGNOSIS — K219 Gastro-esophageal reflux disease without esophagitis: Secondary | ICD-10-CM | POA: Diagnosis present

## 2014-05-05 DIAGNOSIS — Z87891 Personal history of nicotine dependence: Secondary | ICD-10-CM | POA: Diagnosis not present

## 2014-05-05 DIAGNOSIS — R404 Transient alteration of awareness: Secondary | ICD-10-CM | POA: Diagnosis not present

## 2014-05-05 DIAGNOSIS — S59909A Unspecified injury of unspecified elbow, initial encounter: Secondary | ICD-10-CM | POA: Diagnosis not present

## 2014-05-05 DIAGNOSIS — D72829 Elevated white blood cell count, unspecified: Secondary | ICD-10-CM

## 2014-05-05 DIAGNOSIS — I059 Rheumatic mitral valve disease, unspecified: Secondary | ICD-10-CM | POA: Diagnosis present

## 2014-05-05 DIAGNOSIS — S42293A Other displaced fracture of upper end of unspecified humerus, initial encounter for closed fracture: Secondary | ICD-10-CM | POA: Diagnosis not present

## 2014-05-05 DIAGNOSIS — Z96649 Presence of unspecified artificial hip joint: Secondary | ICD-10-CM

## 2014-05-05 DIAGNOSIS — S0993XA Unspecified injury of face, initial encounter: Secondary | ICD-10-CM | POA: Diagnosis not present

## 2014-05-05 DIAGNOSIS — T4275XA Adverse effect of unspecified antiepileptic and sedative-hypnotic drugs, initial encounter: Secondary | ICD-10-CM | POA: Diagnosis not present

## 2014-05-05 DIAGNOSIS — H3530 Unspecified macular degeneration: Secondary | ICD-10-CM | POA: Diagnosis present

## 2014-05-05 DIAGNOSIS — I519 Heart disease, unspecified: Secondary | ICD-10-CM | POA: Diagnosis present

## 2014-05-05 DIAGNOSIS — I209 Angina pectoris, unspecified: Secondary | ICD-10-CM | POA: Diagnosis not present

## 2014-05-05 DIAGNOSIS — M1990 Unspecified osteoarthritis, unspecified site: Secondary | ICD-10-CM

## 2014-05-05 DIAGNOSIS — I10 Essential (primary) hypertension: Secondary | ICD-10-CM | POA: Diagnosis not present

## 2014-05-05 DIAGNOSIS — G8918 Other acute postprocedural pain: Secondary | ICD-10-CM | POA: Diagnosis not present

## 2014-05-05 DIAGNOSIS — Z8249 Family history of ischemic heart disease and other diseases of the circulatory system: Secondary | ICD-10-CM

## 2014-05-05 DIAGNOSIS — S42143A Displaced fracture of glenoid cavity of scapula, unspecified shoulder, initial encounter for closed fracture: Secondary | ICD-10-CM | POA: Diagnosis not present

## 2014-05-05 DIAGNOSIS — Y92009 Unspecified place in unspecified non-institutional (private) residence as the place of occurrence of the external cause: Secondary | ICD-10-CM

## 2014-05-05 DIAGNOSIS — M25519 Pain in unspecified shoulder: Secondary | ICD-10-CM | POA: Diagnosis not present

## 2014-05-05 DIAGNOSIS — M549 Dorsalgia, unspecified: Secondary | ICD-10-CM

## 2014-05-05 DIAGNOSIS — S59919A Unspecified injury of unspecified forearm, initial encounter: Secondary | ICD-10-CM | POA: Diagnosis not present

## 2014-05-05 DIAGNOSIS — S42153A Displaced fracture of neck of scapula, unspecified shoulder, initial encounter for closed fracture: Secondary | ICD-10-CM | POA: Diagnosis not present

## 2014-05-05 DIAGNOSIS — S199XXA Unspecified injury of neck, initial encounter: Secondary | ICD-10-CM | POA: Diagnosis not present

## 2014-05-05 DIAGNOSIS — T502X5A Adverse effect of carbonic-anhydrase inhibitors, benzothiadiazides and other diuretics, initial encounter: Secondary | ICD-10-CM | POA: Diagnosis present

## 2014-05-05 DIAGNOSIS — S81009A Unspecified open wound, unspecified knee, initial encounter: Secondary | ICD-10-CM | POA: Diagnosis not present

## 2014-05-05 DIAGNOSIS — R0789 Other chest pain: Secondary | ICD-10-CM

## 2014-05-05 DIAGNOSIS — Z8719 Personal history of other diseases of the digestive system: Secondary | ICD-10-CM

## 2014-05-05 DIAGNOSIS — S0990XA Unspecified injury of head, initial encounter: Secondary | ICD-10-CM | POA: Diagnosis not present

## 2014-05-05 DIAGNOSIS — I517 Cardiomegaly: Secondary | ICD-10-CM | POA: Diagnosis not present

## 2014-05-05 DIAGNOSIS — T148XXA Other injury of unspecified body region, initial encounter: Secondary | ICD-10-CM | POA: Diagnosis not present

## 2014-05-05 DIAGNOSIS — W19XXXA Unspecified fall, initial encounter: Secondary | ICD-10-CM | POA: Diagnosis present

## 2014-05-05 DIAGNOSIS — N390 Urinary tract infection, site not specified: Secondary | ICD-10-CM | POA: Diagnosis present

## 2014-05-05 DIAGNOSIS — S72009A Fracture of unspecified part of neck of unspecified femur, initial encounter for closed fracture: Secondary | ICD-10-CM

## 2014-05-05 DIAGNOSIS — Z9181 History of falling: Secondary | ICD-10-CM | POA: Diagnosis not present

## 2014-05-05 DIAGNOSIS — S42209A Unspecified fracture of upper end of unspecified humerus, initial encounter for closed fracture: Secondary | ICD-10-CM | POA: Diagnosis not present

## 2014-05-05 DIAGNOSIS — K5900 Constipation, unspecified: Secondary | ICD-10-CM | POA: Diagnosis present

## 2014-05-05 DIAGNOSIS — S81809A Unspecified open wound, unspecified lower leg, initial encounter: Secondary | ICD-10-CM | POA: Diagnosis not present

## 2014-05-05 DIAGNOSIS — Z5189 Encounter for other specified aftercare: Secondary | ICD-10-CM | POA: Diagnosis not present

## 2014-05-05 DIAGNOSIS — S43429A Sprain of unspecified rotator cuff capsule, initial encounter: Secondary | ICD-10-CM | POA: Diagnosis present

## 2014-05-05 DIAGNOSIS — R011 Cardiac murmur, unspecified: Secondary | ICD-10-CM

## 2014-05-05 DIAGNOSIS — K645 Perianal venous thrombosis: Secondary | ICD-10-CM

## 2014-05-05 DIAGNOSIS — S298XXA Other specified injuries of thorax, initial encounter: Secondary | ICD-10-CM | POA: Diagnosis not present

## 2014-05-05 DIAGNOSIS — M7150 Other bursitis, not elsewhere classified, unspecified site: Secondary | ICD-10-CM | POA: Diagnosis present

## 2014-05-05 DIAGNOSIS — E782 Mixed hyperlipidemia: Secondary | ICD-10-CM | POA: Diagnosis not present

## 2014-05-05 DIAGNOSIS — S6990XA Unspecified injury of unspecified wrist, hand and finger(s), initial encounter: Secondary | ICD-10-CM | POA: Diagnosis not present

## 2014-05-05 DIAGNOSIS — S42409A Unspecified fracture of lower end of unspecified humerus, initial encounter for closed fracture: Secondary | ICD-10-CM | POA: Diagnosis not present

## 2014-05-05 DIAGNOSIS — S42309D Unspecified fracture of shaft of humerus, unspecified arm, subsequent encounter for fracture with routine healing: Secondary | ICD-10-CM | POA: Diagnosis not present

## 2014-05-05 LAB — CBC WITH DIFFERENTIAL/PLATELET
BASOS PCT: 0 % (ref 0–1)
Basophils Absolute: 0 10*3/uL (ref 0.0–0.1)
EOS PCT: 0 % (ref 0–5)
Eosinophils Absolute: 0 10*3/uL (ref 0.0–0.7)
HCT: 34.6 % — ABNORMAL LOW (ref 36.0–46.0)
HEMOGLOBIN: 12 g/dL (ref 12.0–15.0)
Lymphocytes Relative: 7 % — ABNORMAL LOW (ref 12–46)
Lymphs Abs: 1.1 10*3/uL (ref 0.7–4.0)
MCH: 31.7 pg (ref 26.0–34.0)
MCHC: 34.7 g/dL (ref 30.0–36.0)
MCV: 91.5 fL (ref 78.0–100.0)
MONOS PCT: 4 % (ref 3–12)
Monocytes Absolute: 0.6 10*3/uL (ref 0.1–1.0)
NEUTROS PCT: 89 % — AB (ref 43–77)
Neutro Abs: 13.3 10*3/uL — ABNORMAL HIGH (ref 1.7–7.7)
PLATELETS: 369 10*3/uL (ref 150–400)
RBC: 3.78 MIL/uL — AB (ref 3.87–5.11)
RDW: 12.4 % (ref 11.5–15.5)
WBC: 15 10*3/uL — AB (ref 4.0–10.5)

## 2014-05-05 LAB — URINALYSIS, ROUTINE W REFLEX MICROSCOPIC
Bilirubin Urine: NEGATIVE
GLUCOSE, UA: NEGATIVE mg/dL
Hgb urine dipstick: NEGATIVE
Ketones, ur: NEGATIVE mg/dL
Nitrite: NEGATIVE
Protein, ur: NEGATIVE mg/dL
Specific Gravity, Urine: 1.017 (ref 1.005–1.030)
Urobilinogen, UA: 0.2 mg/dL (ref 0.0–1.0)
pH: 6 (ref 5.0–8.0)

## 2014-05-05 LAB — BASIC METABOLIC PANEL
BUN: 23 mg/dL (ref 6–23)
CALCIUM: 9.2 mg/dL (ref 8.4–10.5)
CHLORIDE: 96 meq/L (ref 96–112)
CO2: 21 meq/L (ref 19–32)
Creatinine, Ser: 0.61 mg/dL (ref 0.50–1.10)
GFR calc Af Amer: >90 mL/min (ref >90)
GFR calc non Af Amer: 80 mL/min — ABNORMAL LOW (ref >90)
Glucose, Bld: 142 mg/dL — ABNORMAL HIGH (ref 70–99)
POTASSIUM: 4.1 meq/L (ref 3.7–5.3)
SODIUM: 131 meq/L — AB (ref 137–147)

## 2014-05-05 LAB — I-STAT TROPONIN, ED: TROPONIN I, POC: 0.02 ng/mL (ref 0.00–0.08)

## 2014-05-05 LAB — URINE MICROSCOPIC-ADD ON

## 2014-05-05 LAB — PRO B NATRIURETIC PEPTIDE: PRO B NATRI PEPTIDE: 535.7 pg/mL — AB (ref 0–450)

## 2014-05-05 MED ORDER — ENOXAPARIN SODIUM 40 MG/0.4ML ~~LOC~~ SOLN
40.0000 mg | SUBCUTANEOUS | Status: DC
  Administered 2014-05-05 – 2014-05-09 (×4): 40 mg via SUBCUTANEOUS
  Filled 2014-05-05 (×7): qty 0.4

## 2014-05-05 MED ORDER — FENTANYL CITRATE 0.05 MG/ML IJ SOLN
50.0000 ug | Freq: Once | INTRAMUSCULAR | Status: AC
  Administered 2014-05-05: 50 ug via INTRAVENOUS
  Filled 2014-05-05: qty 2

## 2014-05-05 MED ORDER — ONDANSETRON HCL 4 MG PO TABS: 4.0000 mg | ORAL_TABLET | Freq: Four times a day (QID) | ORAL | Status: DC | PRN

## 2014-05-05 MED ORDER — SODIUM CHLORIDE 0.9 % IV SOLN
INTRAVENOUS | Status: DC
  Administered 2014-05-05: 23:00:00 via INTRAVENOUS

## 2014-05-05 MED ORDER — FENTANYL CITRATE 0.05 MG/ML IJ SOLN
12.5000 ug | INTRAMUSCULAR | Status: DC | PRN
  Administered 2014-05-05 – 2014-05-07 (×6): 12.5 ug via INTRAVENOUS
  Filled 2014-05-05 (×10): qty 2

## 2014-05-05 MED ORDER — LORATADINE 10 MG PO TABS: 10.0000 mg | ORAL_TABLET | Freq: Every day | ORAL | Status: DC

## 2014-05-05 MED ORDER — CALCIUM CARBONATE-VITAMIN D 500-200 MG-UNIT PO TABS
2.0000 | ORAL_TABLET | Freq: Two times a day (BID) | ORAL | Status: DC
  Administered 2014-05-05 – 2014-05-10 (×9): 2 via ORAL
  Filled 2014-05-05 (×12): qty 2

## 2014-05-05 MED ORDER — OXYCODONE-ACETAMINOPHEN 5-325 MG PO TABS: 1.0000 | ORAL_TABLET | ORAL | Status: DC | PRN

## 2014-05-05 MED ORDER — LORATADINE 10 MG PO TABS
10.0000 mg | ORAL_TABLET | Freq: Every day | ORAL | Status: DC
  Administered 2014-05-05: 10 mg via ORAL
  Filled 2014-05-05 (×2): qty 1

## 2014-05-05 MED ORDER — CALCIUM CITRATE-VITAMIN D 315-200 MG-UNIT PO TABS: 2.0000 | ORAL_TABLET | Freq: Two times a day (BID) | ORAL | Status: DC

## 2014-05-05 MED ORDER — OXYCODONE-ACETAMINOPHEN 5-325 MG PO TABS
1.0000 | ORAL_TABLET | ORAL | Status: DC | PRN
  Administered 2014-05-07: 1 via ORAL
  Administered 2014-05-07 – 2014-05-09 (×3): 2 via ORAL
  Administered 2014-05-09: 1 via ORAL
  Filled 2014-05-05 (×3): qty 1
  Filled 2014-05-05 (×2): qty 2
  Filled 2014-05-05: qty 1
  Filled 2014-05-05: qty 2
  Filled 2014-05-05: qty 1
  Filled 2014-05-05: qty 2

## 2014-05-05 MED ORDER — HYDROMORPHONE HCL PF 1 MG/ML IJ SOLN
1.0000 mg | INTRAMUSCULAR | Status: DC | PRN
  Administered 2014-05-06: 1 mg via INTRAVENOUS
  Filled 2014-05-05: qty 1

## 2014-05-05 MED ORDER — PANTOPRAZOLE SODIUM 40 MG PO TBEC
40.0000 mg | DELAYED_RELEASE_TABLET | Freq: Every day | ORAL | Status: DC
  Administered 2014-05-06 – 2014-05-10 (×5): 40 mg via ORAL
  Filled 2014-05-05 (×5): qty 1

## 2014-05-05 MED ORDER — ONDANSETRON HCL 4 MG/2ML IJ SOLN
4.0000 mg | Freq: Four times a day (QID) | INTRAMUSCULAR | Status: DC | PRN
  Filled 2014-05-05: qty 2

## 2014-05-05 NOTE — ED Provider Notes (Signed)
Medical screening examination/treatment/procedure(s) were conducted as a shared visit with non-physician practitioner(s) and myself.  I personally evaluated the patient during the encounter.   EKG Interpretation   Date/Time:  Saturday May 05 2014 15:36:11 EDT Ventricular Rate:  110 PR Interval:  174 QRS Duration: 102 QT Interval:  325 QTC Calculation: 440 R Axis:   -49 Text Interpretation:  Sinus tachycardia Abnormal R-wave progression, late  transition Inferior infarct, old Baseline wander in lead(s) V3 V4 V5 V6  compared to prior, rate increased Confirmed by [facility]  MD, TREY ([id number]) on  05/05/2014 4:01:69 PM      80 year old female presenting after a fall. She states she has been dizzy since starting taking the Lasix, and this contributed to her fall today. She did not lose consciousness either causing fall or as a result of it. On exam, nontoxic, not distressed, heart sounds normal with tachycardic rate regular rhythm, lungs clear to auscultation bilaterally, soft and nontender, C-spine nontender, right shoulder arm and elbow are tender to palpation without obvious deformity. 2+ right radial pulses. Plan labs and imaging.  Plain film shows right humerus fracture. Admitted by hospitalist.  Clinical Impression: 1. Dizziness   2. Humeral head fracture       Houston Siren III, MD 05/06/14 [phone]

## 2014-05-05 NOTE — H&P (Signed)
[facility] History and Physical  [name] IOX:[id number] DOB: 01-03-1926 DOA: 05/05/2014  Referring physician: EDP PCP: Horatio Pel, MD   Chief Complaint:  Dizziness and fall today.  HPI: [name] is a 80 y.o. female with h/o MR, hyperlipidemia, hypertension, comes in for persistent dizziness since three weeks and a fall today. As per the patient, she reports the above symptoms and fell today. She denies loss of consciousness. She reports pain in the right arm On arrival to ED, she was found to have leukocytosis,. Her x rays of the right arm revealed comminuted fracture of the humeral head and neck. Her right arm was put in the sling and she was referred to medical service for admission for dizziness. She also reported that she had pedal edema since many months and her PCP started her on lasix three weeks and since then she has been feeling dizziness.  She currently denies any other complaints other than pain in the right arm. Orthopedics was consulted by EDP, who will see th epatient in the morning.  Review of Systems:  See hpi otherwise negative.   Past Medical History  Diagnosis Date  . Hyperlipidemia   . Mitral regurgitation     mild by echo 2003  . Allergic rhinitis   . Actinic keratosis   . Tenosynovitis     myxomatous  . Chronic constipation   . GERD (gastroesophageal reflux disease)   . Osteoarthritis   . Back pain   . Macular degeneration   . Hypertension   . Osteoarthrosis   . Osteoporosis   . Hyponatremia   . Leg edema    Past Surgical History  Procedure Laterality Date  . Synovectomy      of right long finger  . Hernial repain    . Inguinal hernia repair      laparoscopic preperitoneal repair of bilateral inguinal hernias  . Carpal tunnel release      bilateral  . Replacement total knee bilateral    . Tonisellectomy    . Tonisillectomy    . Tonsillectomy     Social History:  reports that she has quit smoking. Her smoking use  included Cigarettes. She has a 2 pack-year smoking history. She does not have any smokeless tobacco history on file. She reports that she drinks about 1.2 ounces of alcohol per week. She reports that she does not use illicit drugs.  Allergies  Allergen Reactions  . Aspirin     Unknown  . Boniva [Ibandronic Acid]     Unknown  . Morphine     Unknown  . Nitrofuran Derivatives     Unknown  . Penicillins     Unknown  . Sulfonamide Derivatives     Unknown    Family History  Problem Relation Age of Onset  . Heart attack Father   . Heart attack Brother      Prior to Admission medications   Medication Sig Start Date End Date Taking? Authorizing Provider  calcium citrate-vitamin D (CITRACAL+D) 315-200 MG-UNIT per tablet Take 2 tablets by mouth 2 (two) times daily.   Yes Historical Provider, MD  cetirizine (ZYRTEC) 10 MG tablet Take 10 mg by mouth daily.    Yes Historical Provider, MD  cetirizine (ZYRTEC) 10 MG tablet Take 5-10 mg by mouth 2 (two) times daily. Take 1/2 tablet ( 5mg ) every morning and 1 tablet (10mg ) at bedtime.   Yes Historical Provider, MD  diclofenac sodium (VOLTAREN) 1 % GEL Apply topically 2 (two)  times daily.   Yes Historical Provider, MD  furosemide (LASIX) 20 MG tablet Take 20 mg by mouth daily.   Yes Historical Provider, MD  magnesium hydroxide (PHILLIPS CHEWS) 311 MG CHEW chewable tablet Chew 311 mg by mouth daily as needed (constipation.).   Yes Historical Provider, MD  Multiple Vitamin (MULTIVITAMIN) capsule Take 1 capsule by mouth daily.    Yes Historical Provider, MD  Multiple Vitamins-Minerals (PRESERVISION AREDS 2 PO) Take 1 tablet by mouth 2 (two) times daily.    Yes Historical Provider, MD  omeprazole (PRILOSEC) 20 MG capsule Take 20 mg by mouth daily.   Yes Historical Provider, MD  oxyCODONE-acetaminophen (PERCOCET/ROXICET) 5-325 MG per tablet Take 1 tablet by mouth every 4 (four) hours as needed for severe pain.   Yes Historical Provider, MD  PRESCRIPTION  MEDICATION Apply topically daily as needed (vaginal yeast infection.).    Yes Historical Provider, MD  PRESCRIPTION MEDICATION Place 1 application around the anus daily as needed (itching).   Yes Historical Provider, MD  Probiotic Product ([place]) Take 1 tablet by mouth daily.   Yes Historical Provider, MD  Ranibizumab (LUCENTIS IO) Inject into the eye every 8 (eight) weeks. Injection in right eye   Yes Historical Provider, MD  trimethoprim (TRIMPEX) 100 MG tablet Take 100 mg by mouth daily. UTI prophylaxis.   Yes Historical Provider, MD  zoledronic acid (RECLAST) 5 MG/100ML SOLN injection Inject 5 mg into the vein. Once annually.   Yes Historical Provider, MD  diphenhydrAMINE (BENADRYL) 25 mg capsule Take 1 capsule (25 mg total) by mouth every 6 (six) hours as needed for itching, allergies or sleep. 07/01/12 07/11/12  Alexandra Passy Babish, PA-C  ferrous sulfate 325 (65 FE) MG tablet Take 1 tablet (325 mg total) by mouth 3 (three) times daily with meals. 06/13/12 06/13/13  Charlynne Cousins, MD  promethazine (PHENERGAN) 12.5 MG tablet Take 1-2 tablets (12.5-25 mg total) by mouth every 6 (six) hours as needed for nausea. 07/01/12 07/08/12  Pricilla Loveless, PA-C   Physical Exam: Filed Vitals:   05/05/14 2146  BP: 168/84  Pulse: 92  Temp: 98.2 F (36.8 C)  Resp: 20    BP 168/84  Pulse 92  Temp(Src) 98.2 F (36.8 C) (Oral)  Resp 20  Ht 5\' 1"  (1.549 m)  Wt 69.718 kg (153 lb 11.2 oz)  BMI 29.06 kg/m2  SpO2 98%  General:  Appears calm and comfortable Eyes: PERRL, normal lids, irises & conjunctiva Neck: no LAD, masses or thyromegaly Cardiovascular: RRR, no m/r/g. No LE edema. Respiratory: CTA bilaterally, no w/r/r. Normal respiratory effort. Abdomen: soft, ntnd Skin: no rash or induration seen on limited exam Musculoskeletal: Right UE in sling, painful RUE movements Neurologic: grossly non-focal.          Labs on Admission:  Basic Metabolic Panel:  Recent  Labs Lab 05/05/14 1610  NA 131*  K 4.1  CL 96  CO2 21  GLUCOSE 142*  BUN 23  CREATININE 0.61  CALCIUM 9.2   Liver Function Tests: No results found for this basename: AST, ALT, ALKPHOS, BILITOT, PROT, ALBUMIN,  in the last 168 hours No results found for this basename: LIPASE, AMYLASE,  in the last 168 hours No results found for this basename: AMMONIA,  in the last 168 hours CBC:  Recent Labs Lab 05/05/14 1610  WBC 15.0*  NEUTROABS 13.3*  HGB 12.0  HCT 34.6*  MCV 91.5  PLT 369   Cardiac Enzymes: No results found for  this basename: CKTOTAL, CKMB, CKMBINDEX, TROPONINI,  in the last 168 hours  BNP (last 3 results)  Recent Labs  05/05/14 1610  PROBNP 535.7*   CBG: No results found for this basename: GLUCAP,  in the last 168 hours  Radiological Exams on Admission: Dg Chest 2 View  05/05/2014   CLINICAL DATA:  Right shoulder pain status post fall history of previous tobacco use  EXAM: CHEST  2 VIEW  COMPARISON:  Portable chest x-ray of June 11, 2012 and June 09, 2012  FINDINGS: The right lung is adequately inflated and clear. There is chronically increased density at the left lung base likely reflecting a large hiatal hernia -partially intrathoracic stomach. The pulmonary vascularity is not engorged. There is stable significant dextroscoliosis of the mid thoracic spine. There are degenerative changes of the left shoulder. There is an acute impacted, comminuted, and displaced fracture of the humeral head on the right.  IMPRESSION: 1. There is no active cardiopulmonary disease. 2. There is an acute fracture of the humeral head and neck on the right. 3. A large hiatal hernia-partially intrathoracic stomach is suspected and appears stable.   Electronically Signed   By: David  Martinique   On: 05/05/2014 17:04   Dg Shoulder Right  05/05/2014   CLINICAL DATA:  Fall.  Right shoulder injury and pain.  EXAM: RIGHT SHOULDER - 2+ VIEW  COMPARISON:  None.  FINDINGS: A highly comminuted and  displaced fracture is seen involving the humeral head and neck. No evidence of dislocation. Generalized osteopenia noted. Degenerative changes also seen involving the glenohumeral and acromioclavicular joints.  IMPRESSION: Comminuted and displaced fracture involving the humeral head and neck.   Electronically Signed   By: Earle Gell M.D.   On: 05/05/2014 17:00   Dg Elbow 2 Views Right  05/05/2014   CLINICAL DATA:  Fall.  Right elbow injury and pain.  EXAM: RIGHT ELBOW - 2 VIEW  COMPARISON:  None.  FINDINGS: There is no evidence of acute fracture, dislocation, or joint effusion. Osteoarthritis seen mainly involving the lateral joint space. Soft tissues are unremarkable.  IMPRESSION: No acute findings.  Degenerative joint disease.   Electronically Signed   By: Earle Gell M.D.   On: 05/05/2014 17:02   Ct Head Wo Contrast  05/05/2014   CLINICAL DATA:  Status post fall with dizziness and right shoulder injury  EXAM: CT HEAD WITHOUT CONTRAST  CT CERVICAL SPINE WITHOUT CONTRAST  TECHNIQUE: Multidetector CT imaging of the head and cervical spine was performed following the standard protocol without intravenous contrast. Multiplanar CT image reconstructions of the cervical spine were also generated.  COMPARISON:  Noncontrast CT scan of brain dated June 09, 2012  FINDINGS: CT HEAD FINDINGS  The ventricles are normal in size and position. There is mild age appropriate diffuse cerebral and cerebellar atrophy. There is decreased density in the deep white matter of both cerebral hemispheres consistent with chronic small vessel ischemic type change. An old lacunar infarction in the anterior limb of the right internal capsule is present. There is no acute intracranial hemorrhage.  At bone window settings the observed portions of the paranasal sinuses and mastoid air cells are clear. There is no acute skull fracture.  CT CERVICAL SPINE FINDINGS  There is mild loss of the normal cervical lordosis. The vertebral bodies are  preserved in height. There is disc space narrowing at C3-4 through C6-7. Large anterior and smaller posterior osteophytes are present. The prevertebral soft tissue spaces are normal. There is facet  joint hypertrophy at multiple levels. The odontoid is intact. The bony ring at each cervical level is normal.  The pulmonary apices are clear.  IMPRESSION: 1. There is no evidence of an acute ischemic or hemorrhagic event. 2. There are stable age related small vessel ischemic changes. 3. There is no acute cervical spine fracture nor dislocation. Significant degenerative disc and facet joint changes are present at multiple levels.   Electronically Signed   By: David  [place]   On: 05/05/2014 17:19   Ct Cervical Spine Wo Contrast  05/05/2014   CLINICAL DATA:  Status post fall with dizziness and right shoulder injury  EXAM: CT HEAD WITHOUT CONTRAST  CT CERVICAL SPINE WITHOUT CONTRAST  TECHNIQUE: Multidetector CT imaging of the head and cervical spine was performed following the standard protocol without intravenous contrast. Multiplanar CT image reconstructions of the cervical spine were also generated.  COMPARISON:  Noncontrast CT scan of brain dated June 09, 2012  FINDINGS: CT HEAD FINDINGS  The ventricles are normal in size and position. There is mild age appropriate diffuse cerebral and cerebellar atrophy. There is decreased density in the deep white matter of both cerebral hemispheres consistent with chronic small vessel ischemic type change. An old lacunar infarction in the anterior limb of the right internal capsule is present. There is no acute intracranial hemorrhage.  At bone window settings the observed portions of the paranasal sinuses and mastoid air cells are clear. There is no acute skull fracture.  CT CERVICAL SPINE FINDINGS  There is mild loss of the normal cervical lordosis. The vertebral bodies are preserved in height. There is disc space narrowing at C3-4 through C6-7. Large anterior and smaller  posterior osteophytes are present. The prevertebral soft tissue spaces are normal. There is facet joint hypertrophy at multiple levels. The odontoid is intact. The bony ring at each cervical level is normal.  The pulmonary apices are clear.  IMPRESSION: 1. There is no evidence of an acute ischemic or hemorrhagic event. 2. There are stable age related small vessel ischemic changes. 3. There is no acute cervical spine fracture nor dislocation. Significant degenerative disc and facet joint changes are present at multiple levels.   Electronically Signed   By: David  [place]   On: 05/05/2014 17:19   Ct Shoulder Right Wo Contrast  05/05/2014   CLINICAL DATA:  Known right shoulder fracture  EXAM: CT OF THE RIGHT SHOULDER WITHOUT CONTRAST  TECHNIQUE: Multidetector CT imaging was performed according to the standard protocol. Multiplanar CT image reconstructions were also generated.  COMPARISON:  Right shoulder two-view series of today's date  FINDINGS: There is a comminuted impacted fracture of the humeral head and neck. There is a small free bone fragment just inferior to the acromion. There are pre-existing degenerative changes of the articular cortex of the humeral head. The bony glenoid exhibits degenerative change and a minimally distracted fracture of the anterior lip. There are degenerative changes of the [facility] joint. The acromion and visualized portions of the clavicle exhibit no acute abnormalities. There is degenerative change of the right sternoclavicular joint. The body of the scapula is intact where visualized.  There is a joint effusion.  There is a hematoma deep to the deltoid.  IMPRESSION: 1. There is a comminuted impacted fracture of the humeral head and neck. 2. There is a minimally distracted fracture of the anterior lip of the glenoid. 3. There is a hematoma deep to the deltoid muscle.   Electronically Signed   By: David  [place]  On: 05/05/2014 21:01   Dg Humerus Right  05/05/2014   CLINICAL DATA:  Pain  post trauma  EXAM: RIGHT HUMERUS - 2+ VIEW  COMPARISON:  None.  FINDINGS: Frontal, Y scapular, and axillary views were obtained. There is a comminuted fracture of the proximal humeral metaphysis with marked displacement of the humeral head inferior to the humeral shaft with rotation and multiple fragments surrounding the proximal humeral shaft. There is no gross dislocation. Bones appear osteoporotic.  IMPRESSION: Extensively comminuted fracture of the proximal right humerus near the epiphysis -metaphysis junction with marked rotation and inferior displacement of the humeral head with respect to the remainder the bone.   Electronically Signed   By: Lowella Grip M.D.   On: 05/05/2014 17:02    EKG: sinus tachycardia at 110/min  Assessment/Plan Active Problems:   GERD   Leukocytosis   Dizziness   Fall  1. Dizziness and fall: Admit to telemetry and evaluate for arrythmia's.  Patient denies any chest pain or sob or palpitations. Serial troponins and echocardiogram.  Stop lasix and get orthostatic vital signs.  Gentle hydration.   2. Hyponatremia: - ? Dehydration. Gentle hydration and repeat bmp in am.   3. Right humerus fractures: Right arm in sling. Pain control and orthopedics consulted.   4. Leukocytosis:  - she is afebrile, UA and CXR negative for infection.  - no indication of antibiotics.   DVT prophylaxis.      Code Status: presumed full code.  Family Communication: discussed with family members at bedside Disposition Plan: admit to telemetry  Time spent: 65 min  Hosie Poisson [facility] Pager [phone]  **Disclaimer: This note may have been dictated with voice recognition software. Similar sounding words can inadvertently be transcribed and this note may contain transcription errors which may not have been corrected upon publication of note.**

## 2014-05-05 NOTE — ED Notes (Signed)
PA at bedside.

## 2014-05-05 NOTE — ED Notes (Signed)
Bed: WA08 Expected date:  Expected time:  Means of arrival:  Comments: EMS-fall 

## 2014-05-05 NOTE — ED Notes (Addendum)
Pt from home via [facility] c/o right shoulder pain an swelling r/t to a fall. Pt became dizzy while leaning over trash can to throw away food. She fell onto right shoulder. She denies hitting head pt ha NO LOC. She does report that she just had fluid drained off of her right shoulder on Thursday in which she has done often. Pain 6/10. Arm in sling from [facility]. Pt alert and oriented and normally walks with a walker. 20g left hand and 150 mcg of fentanyl given en-route

## 2014-05-05 NOTE — ED Notes (Signed)
Pt sts that she started lasix 3 weeks ago and ever since has been having issues with dizziness and balance. Pt got dizzy today and lost her balance and fell onto the floor on her right side from a standing position. Pt denies LOC, hitting her head. Pt c/o R shoulder and arm pain. Pt denies hip pain. Pt. Has small skin tears on R elbow and L lower leg.

## 2014-05-05 NOTE — ED Provider Notes (Signed)
CSN: [id number]     Arrival date & time 05/05/14  1526 History   First MD Initiated Contact with Patient 05/05/14 1541     Chief Complaint  Patient presents with  . Fall  . Dizziness  . Shoulder Injury     (Consider location/radiation/quality/duration/timing/severity/associated sxs/prior Treatment) HPI Comments: Patient presents to the ED with a chief complaint of fall.  She states that she was walking with a walker today, when she let go, became dizzy, and fell to the ground.  She states that she has been feeling weaker than usual since starting lasix prescribed by her PCP for LE edema.  She states that she has also been feeling dizzy.  She states that she did not pass out, and does not recall hitting her head.  She complains of right shoulder, and arm pain.  She also complains of mild pain in her neck.  She has taken oxycodone with no relief.  The pain is worsened with movement and palpation.  It is improved with rest.  The pain does not radiate.  Currently, she rates the pain as an 8/10.  She denies chest pain, SOB, or abdominal pain.  The history is provided by the patient. No language interpreter was used.    Past Medical History  Diagnosis Date  . Hyperlipidemia   . Mitral regurgitation     mild by echo 2003  . Allergic rhinitis   . Actinic keratosis   . Tenosynovitis     myxomatous  . Chronic constipation   . GERD (gastroesophageal reflux disease)   . Osteoarthritis   . Back pain   . Macular degeneration   . Hypertension   . Osteoarthrosis   . Osteoporosis   . Hyponatremia   . Leg edema    Past Surgical History  Procedure Laterality Date  . Synovectomy      of right long finger  . Hernial repain    . Inguinal hernia repair      laparoscopic preperitoneal repair of bilateral inguinal hernias  . Carpal tunnel release      bilateral  . Replacement total knee bilateral    . Tonisellectomy    . Tonisillectomy    . Tonsillectomy     Family History  Problem Relation  Age of Onset  . Heart attack Father   . Heart attack Brother    History  Substance Use Topics  . Smoking status: Former Smoker -- 1.00 packs/day for 2 years    Types: Cigarettes  . Smokeless tobacco: Not on file  . Alcohol Use: 1.2 oz/week    2 Glasses of wine per week     Comment: 2 glasses per week   OB History   Grav Para Term Preterm Abortions TAB SAB Ect Mult Living                 Review of Systems  Constitutional: Negative for fever and chills.  Respiratory: Negative for shortness of breath.   Cardiovascular: Negative for chest pain.  Gastrointestinal: Negative for nausea, vomiting, diarrhea and constipation.  Genitourinary: Negative for dysuria.  Musculoskeletal: Positive for arthralgias, gait problem and joint swelling.  All other systems reviewed and are negative.     Allergies  Aspirin; Boniva; Morphine; Nitrofuran derivatives; Penicillins; and Sulfonamide derivatives  Home Medications   Prior to Admission medications   Medication Sig Start Date End Date Taking? Authorizing Provider  cetirizine (ZYRTEC) 10 MG tablet Take 10 mg by mouth daily.     Historical Provider,  MD  Cholecalciferol (VITAMIN D3) 1000 UNITS tablet Take 1,000 Units by mouth daily.     Historical Provider, MD  diphenhydrAMINE (BENADRYL) 25 mg capsule Take 1 capsule (25 mg total) by mouth every 6 (six) hours as needed for itching, allergies or sleep. 07/01/12 07/11/12  Lucille Passy Babish, PA-C  enoxaparin (LOVENOX) 40 MG/0.4ML injection Inject 0.4 mLs (40 mg total) into the skin See admin instructions. Daily for 2 weeks-stop on 06/27/12 07/01/12   Lucille Passy Babish, PA-C  esomeprazole (NEXIUM) 40 MG capsule Take 40 mg by mouth daily before breakfast.    Historical Provider, MD  ferrous sulfate 325 (65 FE) MG tablet Take 1 tablet (325 mg total) by mouth 3 (three) times daily with meals. 06/13/12 06/13/13  Charlynne Cousins, MD  fish oil-omega-3 fatty acids 1000 MG capsule Take 1 g by mouth daily.       Historical Provider, MD  Magnesium 250 MG TABS Take 1 tablet by mouth daily.     Historical Provider, MD  Multiple Vitamin (MULTIVITAMIN) capsule Take 1 capsule by mouth daily.     Historical Provider, MD  Multiple Vitamins-Minerals (PRESERVISION AREDS 2 PO) Take 1 tablet by mouth 2 (two) times daily.     Historical Provider, MD  olmesartan (BENICAR) 20 MG tablet Take 1 tablet (20 mg total) by mouth daily. 06/16/12   Charlynne Cousins, MD  promethazine (PHENERGAN) 12.5 MG tablet Take 1-2 tablets (12.5-25 mg total) by mouth every 6 (six) hours as needed for nausea. 07/01/12 07/08/12  Lucille Passy Babish, PA-C  Ranibizumab (LUCENTIS IO) Inject into the eye. Injection in right eye    Historical Provider, MD  senna-docusate (SENOKOT-S) 8.6-50 MG per tablet Take 1 tablet by mouth 2 (two) times daily.    Historical Provider, MD  trimethoprim (TRIMPEX) 100 MG tablet Take 100 mg by mouth daily.    Historical Provider, MD  vitamin B-12 (CYANOCOBALAMIN) 500 MCG tablet Take 500 mcg by mouth daily.     Historical Provider, MD  Wheat Dextrin (BENEFIBER DRINK MIX PO) Take by mouth. 2 tsp two times a day    Historical Provider, MD   BP 164/87  Pulse 117  Temp(Src) 98.7 F (37.1 C) (Oral)  Resp 17  SpO2 95% Physical Exam  Nursing note and vitals reviewed. Constitutional: She is oriented to person, place, and time. She appears well-developed and well-nourished.  HENT:  Head: Normocephalic and atraumatic.  Eyes: Conjunctivae and EOM are normal. Pupils are equal, round, and reactive to light.  Neck: Normal range of motion. Neck supple.  Cardiovascular: Regular rhythm.  Exam reveals no gallop and no friction rub.   No murmur heard. Tachycardic  Intact distal pulses with brisk cap refill  Pulmonary/Chest: Effort normal and breath sounds normal. No respiratory distress. She has no wheezes. She has no rales. She exhibits no tenderness.  Abdominal: Soft. She exhibits no distension and no mass. There is no  tenderness. There is no rebound and no guarding.  No focal abdominal tenderness, no RLQ tenderness or pain at McBurney's point, no RUQ tenderness or Murphy's sign, no left-sided abdominal tenderness, no fluid wave, or signs of peritonitis   Musculoskeletal: Normal range of motion. She exhibits no edema and no tenderness.  Right shoulder, upper arm, and elbow ttp, ROM and strength deferred, no obvious bony abnormality or deformity  Cervical spine mildly ttp, but no bony step-offs or other deformity  Moves all other extremities  Minimal LE edema  Neurological: She is alert and oriented  to person, place, and time.  Sensation intact throughout  Skin: Skin is warm and dry.  Psychiatric: She has a normal mood and affect. Her behavior is normal. Judgment and thought content normal.    ED Course  Procedures (including critical care time) Results for orders placed during the hospital encounter of 05/05/14  CBC WITH DIFFERENTIAL      Result Value Ref Range   WBC 15.0 (*) 4.0 - 10.5 K/uL   RBC 3.78 (*) 3.87 - 5.11 MIL/uL   Hemoglobin 12.0  12.0 - 15.0 g/dL   HCT 34.6 (*) 36.0 - 46.0 %   MCV 91.5  78.0 - 100.0 fL   MCH 31.7  26.0 - 34.0 pg   MCHC 34.7  30.0 - 36.0 g/dL   RDW 12.4  11.5 - 15.5 %   Platelets 369  150 - 400 K/uL   Neutrophils Relative % 89 (*) 43 - 77 %   Lymphocytes Relative 7 (*) 12 - 46 %   Monocytes Relative 4  3 - 12 %   Eosinophils Relative 0  0 - 5 %   Basophils Relative 0  0 - 1 %   Neutro Abs 13.3 (*) 1.7 - 7.7 K/uL   Lymphs Abs 1.1  0.7 - 4.0 K/uL   Monocytes Absolute 0.6  0.1 - 1.0 K/uL   Eosinophils Absolute 0.0  0.0 - 0.7 K/uL   Basophils Absolute 0.0  0.0 - 0.1 K/uL   Smear Review LARGE PLATELETS PRESENT    BASIC METABOLIC PANEL      Result Value Ref Range   Sodium 131 (*) 137 - 147 mEq/L   Potassium 4.1  3.7 - 5.3 mEq/L   Chloride 96  96 - 112 mEq/L   CO2 21  19 - 32 mEq/L   Glucose, Bld 142 (*) 70 - 99 mg/dL   BUN 23  6 - 23 mg/dL   Creatinine, Ser  0.61  0.50 - 1.10 mg/dL   Calcium 9.2  8.4 - 10.5 mg/dL   GFR calc non Af Amer 80 (*) >90 mL/min   GFR calc Af Amer >90  >90 mL/min  PRO B NATRIURETIC PEPTIDE      Result Value Ref Range   Pro B Natriuretic peptide (BNP) 535.7 (*) 0 - 450 pg/mL  I-STAT TROPOININ, ED      Result Value Ref Range   Troponin i, poc 0.02  0.00 - 0.08 ng/mL   Comment 3            Dg Chest 2 View  05/05/2014   CLINICAL DATA:  Right shoulder pain status post fall history of previous tobacco use  EXAM: CHEST  2 VIEW  COMPARISON:  Portable chest x-ray of June 11, 2012 and June 09, 2012  FINDINGS: The right lung is adequately inflated and clear. There is chronically increased density at the left lung base likely reflecting a large hiatal hernia -partially intrathoracic stomach. The pulmonary vascularity is not engorged. There is stable significant dextroscoliosis of the mid thoracic spine. There are degenerative changes of the left shoulder. There is an acute impacted, comminuted, and displaced fracture of the humeral head on the right.  IMPRESSION: 1. There is no active cardiopulmonary disease. 2. There is an acute fracture of the humeral head and neck on the right. 3. A large hiatal hernia-partially intrathoracic stomach is suspected and appears stable.   Electronically Signed   By: David  Martinique   On: 05/05/2014 17:04   Dg Shoulder  Right  05/05/2014   CLINICAL DATA:  Fall.  Right shoulder injury and pain.  EXAM: RIGHT SHOULDER - 2+ VIEW  COMPARISON:  None.  FINDINGS: A highly comminuted and displaced fracture is seen involving the humeral head and neck. No evidence of dislocation. Generalized osteopenia noted. Degenerative changes also seen involving the glenohumeral and acromioclavicular joints.  IMPRESSION: Comminuted and displaced fracture involving the humeral head and neck.   Electronically Signed   By: Earle Gell M.D.   On: 05/05/2014 17:00   Dg Elbow 2 Views Right  05/05/2014   CLINICAL DATA:  Fall.  Right elbow  injury and pain.  EXAM: RIGHT ELBOW - 2 VIEW  COMPARISON:  None.  FINDINGS: There is no evidence of acute fracture, dislocation, or joint effusion. Osteoarthritis seen mainly involving the lateral joint space. Soft tissues are unremarkable.  IMPRESSION: No acute findings.  Degenerative joint disease.   Electronically Signed   By: Earle Gell M.D.   On: 05/05/2014 17:02   Ct Head Wo Contrast  05/05/2014   CLINICAL DATA:  Status post fall with dizziness and right shoulder injury  EXAM: CT HEAD WITHOUT CONTRAST  CT CERVICAL SPINE WITHOUT CONTRAST  TECHNIQUE: Multidetector CT imaging of the head and cervical spine was performed following the standard protocol without intravenous contrast. Multiplanar CT image reconstructions of the cervical spine were also generated.  COMPARISON:  Noncontrast CT scan of brain dated June 09, 2012  FINDINGS: CT HEAD FINDINGS  The ventricles are normal in size and position. There is mild age appropriate diffuse cerebral and cerebellar atrophy. There is decreased density in the deep white matter of both cerebral hemispheres consistent with chronic small vessel ischemic type change. An old lacunar infarction in the anterior limb of the right internal capsule is present. There is no acute intracranial hemorrhage.  At bone window settings the observed portions of the paranasal sinuses and mastoid air cells are clear. There is no acute skull fracture.  CT CERVICAL SPINE FINDINGS  There is mild loss of the normal cervical lordosis. The vertebral bodies are preserved in height. There is disc space narrowing at C3-4 through C6-7. Large anterior and smaller posterior osteophytes are present. The prevertebral soft tissue spaces are normal. There is facet joint hypertrophy at multiple levels. The odontoid is intact. The bony ring at each cervical level is normal.  The pulmonary apices are clear.  IMPRESSION: 1. There is no evidence of an acute ischemic or hemorrhagic event. 2. There are stable age  related small vessel ischemic changes. 3. There is no acute cervical spine fracture nor dislocation. Significant degenerative disc and facet joint changes are present at multiple levels.   Electronically Signed   By: David  Martinique   On: 05/05/2014 17:19   Ct Cervical Spine Wo Contrast  05/05/2014   CLINICAL DATA:  Status post fall with dizziness and right shoulder injury  EXAM: CT HEAD WITHOUT CONTRAST  CT CERVICAL SPINE WITHOUT CONTRAST  TECHNIQUE: Multidetector CT imaging of the head and cervical spine was performed following the standard protocol without intravenous contrast. Multiplanar CT image reconstructions of the cervical spine were also generated.  COMPARISON:  Noncontrast CT scan of brain dated June 09, 2012  FINDINGS: CT HEAD FINDINGS  The ventricles are normal in size and position. There is mild age appropriate diffuse cerebral and cerebellar atrophy. There is decreased density in the deep white matter of both cerebral hemispheres consistent with chronic small vessel ischemic type change. An old lacunar infarction in  the anterior limb of the right internal capsule is present. There is no acute intracranial hemorrhage.  At bone window settings the observed portions of the paranasal sinuses and mastoid air cells are clear. There is no acute skull fracture.  CT CERVICAL SPINE FINDINGS  There is mild loss of the normal cervical lordosis. The vertebral bodies are preserved in height. There is disc space narrowing at C3-4 through C6-7. Large anterior and smaller posterior osteophytes are present. The prevertebral soft tissue spaces are normal. There is facet joint hypertrophy at multiple levels. The odontoid is intact. The bony ring at each cervical level is normal.  The pulmonary apices are clear.  IMPRESSION: 1. There is no evidence of an acute ischemic or hemorrhagic event. 2. There are stable age related small vessel ischemic changes. 3. There is no acute cervical spine fracture nor dislocation.  Significant degenerative disc and facet joint changes are present at multiple levels.   Electronically Signed   By: David  Martinique   On: 05/05/2014 17:19   Dg Humerus Right  05/05/2014   CLINICAL DATA:  Pain post trauma  EXAM: RIGHT HUMERUS - 2+ VIEW  COMPARISON:  None.  FINDINGS: Frontal, Y scapular, and axillary views were obtained. There is a comminuted fracture of the proximal humeral metaphysis with marked displacement of the humeral head inferior to the humeral shaft with rotation and multiple fragments surrounding the proximal humeral shaft. There is no gross dislocation. Bones appear osteoporotic.  IMPRESSION: Extensively comminuted fracture of the proximal right humerus near the epiphysis -metaphysis junction with marked rotation and inferior displacement of the humeral head with respect to the remainder the bone.   Electronically Signed   By: Lowella Grip M.D.   On: 05/05/2014 17:02   LACERATION REPAIR Performed by: Montine Circle Authorized by: Montine Circle Consent: Verbal consent obtained. Risks and benefits: risks, benefits and alternatives were discussed Consent given by: patient Patient identity confirmed: provided demographic data Prepped and Draped in normal sterile fashion Wound explored  Laceration Location:right elbow  Laceration Length: 3 cm  No Foreign Bodies seen or palpated  Anesthesia: local infiltration  Local anesthetic: lidocaine 2% without epinephrine  Anesthetic total: 3 ml  Irrigation method: syringe Amount of cleaning: standard  Skin closure: 4-0 prolene  Number of sutures: 4  Technique: interrupted  Patient tolerance: Patient tolerated the procedure well with no immediate complications.  LACERATION REPAIR Performed by: Montine Circle Authorized by: Montine Circle Consent: Verbal consent obtained. Risks and benefits: risks, benefits and alternatives were discussed Consent given by: patient Patient identity confirmed: provided  demographic data Prepped and Draped in normal sterile fashion Wound explored  Laceration Location: left shin  Laceration Length: 1cm  No Foreign Bodies seen or palpated  Anesthesia:none  Local anesthetic: none  Anesthetic total: none Irrigation method: syringe Amount of cleaning: standard  Skin closure: dermabond  Number of sutures: dermabond  Technique: dermabond  Patient tolerance: Patient tolerated the procedure well with no immediate complications.   EKG Interpretation   Date/Time:  Saturday May 05 2014 15:36:11 EDT Ventricular Rate:  110 PR Interval:  174 QRS Duration: 102 QT Interval:  325 QTC Calculation: 440 R Axis:   -49 Text Interpretation:  Sinus tachycardia Abnormal R-wave progression, late  transition Inferior infarct, old Baseline wander in lead(s) V3 V4 V5 V6  compared to prior, rate increased Confirmed by [facility]  MD, TREY ([id number]) on  05/05/2014 4:01:25 PM      MDM   Final diagnoses:  Dizziness  Humeral  head fracture   Patient with dizziness x2 weeks, also complaining of fall. Check labs, and will reevaluate.  Suspect that the patient's dizziness is medication related. She does have a right humerus fracture. Have discussed this with Dr. Doran Durand.  Will admit to medicine for the dizziness, I have also called Dr. Doran Durand back, and informed him that the patient will be admitted. Orthopedics will see the patient tomorrow sometime.     Montine Circle, PA-C 05/05/14 [place], PA-C 05/06/14 [phone]

## 2014-05-06 DIAGNOSIS — S42309A Unspecified fracture of shaft of humerus, unspecified arm, initial encounter for closed fracture: Secondary | ICD-10-CM | POA: Diagnosis present

## 2014-05-06 DIAGNOSIS — N390 Urinary tract infection, site not specified: Secondary | ICD-10-CM | POA: Diagnosis present

## 2014-05-06 DIAGNOSIS — I517 Cardiomegaly: Secondary | ICD-10-CM

## 2014-05-06 DIAGNOSIS — I10 Essential (primary) hypertension: Secondary | ICD-10-CM | POA: Diagnosis present

## 2014-05-06 LAB — BASIC METABOLIC PANEL
BUN: 15 mg/dL (ref 6–23)
CO2: 25 mEq/L (ref 19–32)
Calcium: 8.8 mg/dL (ref 8.4–10.5)
Chloride: 95 mEq/L — ABNORMAL LOW (ref 96–112)
Creatinine, Ser: 0.44 mg/dL — ABNORMAL LOW (ref 0.50–1.10)
GFR calc Af Amer: >90 mL/min (ref >90)
GFR, EST NON AFRICAN AMERICAN: 89 mL/min — AB (ref >90)
GLUCOSE: 92 mg/dL (ref 70–99)
POTASSIUM: 4 meq/L (ref 3.7–5.3)
Sodium: 131 mEq/L — ABNORMAL LOW (ref 137–147)

## 2014-05-06 LAB — TROPONIN I
Troponin I: <0.3 ng/mL (ref <0.30)
Troponin I: <0.3 ng/mL (ref <0.30)

## 2014-05-06 MED ORDER — SIMETHICONE 80 MG PO CHEW
80.0000 mg | CHEWABLE_TABLET | Freq: Four times a day (QID) | ORAL | Status: DC | PRN
Start: 2014-05-06 — End: 2014-05-08
  Administered 2014-05-06 (×2): 80 mg via ORAL
  Filled 2014-05-06: qty 1

## 2014-05-06 MED ORDER — IRBESARTAN 75 MG PO TABS
75.0000 mg | ORAL_TABLET | Freq: Every day | ORAL | Status: DC
  Administered 2014-05-06 – 2014-05-10 (×4): 75 mg via ORAL
  Filled 2014-05-06 (×5): qty 1

## 2014-05-06 MED ORDER — HYDRALAZINE HCL 20 MG/ML IJ SOLN
2.0000 mg | Freq: Four times a day (QID) | INTRAMUSCULAR | Status: DC | PRN
  Filled 2014-05-06: qty 0.1

## 2014-05-06 MED ORDER — SENNOSIDES-DOCUSATE SODIUM 8.6-50 MG PO TABS
1.0000 | ORAL_TABLET | Freq: Two times a day (BID) | ORAL | Status: DC
  Administered 2014-05-06: 1 via ORAL
  Filled 2014-05-06 (×3): qty 1

## 2014-05-06 MED ORDER — CETIRIZINE HCL 10 MG PO TABS
10.0000 mg | ORAL_TABLET | Freq: Every day | ORAL | Status: DC
  Administered 2014-05-06 – 2014-05-09 (×3): 10 mg via ORAL
  Filled 2014-05-06 (×8): qty 1

## 2014-05-06 MED ORDER — CETIRIZINE HCL 10 MG PO TABS
5.0000 mg | ORAL_TABLET | Freq: Every day | ORAL | Status: DC
  Administered 2014-05-07 – 2014-05-10 (×4): 5 mg via ORAL
  Filled 2014-05-06 (×5): qty 1

## 2014-05-06 MED ORDER — NON FORMULARY: 5.0000 mg | Freq: Two times a day (BID) | Status: DC

## 2014-05-06 MED ORDER — CIPROFLOXACIN IN D5W 400 MG/200ML IV SOLN
400.0000 mg | Freq: Two times a day (BID) | INTRAVENOUS | Status: DC
  Administered 2014-05-06 – 2014-05-09 (×7): 400 mg via INTRAVENOUS
  Filled 2014-05-06 (×9): qty 200

## 2014-05-06 NOTE — Plan of Care (Signed)
Problem: Phase I Progression Outcomes Goal: Voiding-avoid urinary catheter unless indicated Outcome: Progressing Foley cath for urinary retention

## 2014-05-06 NOTE — Progress Notes (Signed)
PROGRESS NOTE  [name] CVE:[id number] DOB: 09-15-1926 DOA: 05/05/2014 PCP: Horatio Pel, MD  HPI: [name] is a 80 y.o. female with h/o MR, hyperlipidemia, hypertension, comes in for persistent dizziness since three weeks and a fall today. As per the patient, she reports the above symptoms and fell today. She denies loss of consciousness. She reports pain in the right arm On arrival to ED, she was found to have leukocytosis,. Her x rays of the right arm revealed comminuted fracture of the humeral head and neck. Her right arm was put in the sling and she was referred to medical service for admission for dizziness. She also reported that she had pedal edema since many months and her PCP started her on lasix three weeks and since then she has been feeling dizziness. She currently denies any other complaints other than pain in the right arm. Orthopedics was consulted by EDP, who will see th epatient in the morning.   Assessment/Plan: Dizziness and fall:  - Admited to telemetry. Her dizziness started since starting her Lasix 2 weeks ago for LE swelling.  - stop Lasix, add back Avapro on which she was up until 2 weeks ago - cycle CEs, 2D echo pending.   HTN - add back Avapro since she is hypertensive this morning   Hyponatremia:  - ? Dehydration. Gentle hydration and repeat bmp today.   Right humerus fractures:  Right arm in sling. Pain control and orthopedics consulted.  - has nausea with dilaudid, will avoid and use fentanyl which she tolerates  Leukocytosis:  - she is afebrile, UA with leukocytes and patient with dysuria symptoms, will start Ciprofloxacin (allergy to penicillin), send urine culture - CXR without evidence of infection  Diet: regular Fluids: NS at 50 cc/h DVT Prophylaxis: Lovenox  Code Status: Full Family Communication: d/w patient  Disposition Plan: inpatient  Consultants:  Orthopedic surgery   Procedures:  None     Antibiotics Ciprofloxacin 6/7 >>  HPI/Subjective: - feeling a bit nauseated this morning after her pain medicine.  Objective: Filed Vitals:   05/05/14 1915 05/05/14 2121 05/05/14 2146 05/06/14 0614  BP: 149/76 157/76 168/84 176/91  Pulse: 96 92 92 77  Temp:  98.1 F (36.7 C) 98.2 F (36.8 C) 98.3 F (36.8 C)  TempSrc:  Oral Oral Oral  Resp: 18 18 20 20   Height:   5\' 1"  (1.549 m)   Weight:   69.718 kg (153 lb 11.2 oz)   SpO2: 97% 99% 98% 95%    Intake/Output Summary (Last 24 hours) at 05/06/14 0804 Last data filed at 05/06/14 0258  Gross per 24 hour  Intake   2150 ml  Output    900 ml  Net   1250 ml   Filed Weights   05/05/14 2146  Weight: 69.718 kg (153 lb 11.2 oz)    Exam:  General:  NAD, right arm in sling  Cardiovascular: regular rate and rhythm, without MRG  Respiratory: good air movement, clear to auscultation throughout, no wheezing, ronchi or rales  Abdomen: soft, not tender to palpation, positive bowel sounds  MSK: trace peripheral edema  Neuro: non focal  Data Reviewed: Basic Metabolic Panel:  Recent Labs Lab 05/05/14 1610  NA 131*  K 4.1  CL 96  CO2 21  GLUCOSE 142*  BUN 23  CREATININE 0.61  CALCIUM 9.2   CBC:  Recent Labs Lab 05/05/14 1610  WBC 15.0*  NEUTROABS 13.3*  HGB 12.0  HCT 34.6*  MCV 91.5  PLT 369   Cardiac Enzymes:  Recent Labs Lab 05/06/14 0041 05/06/14 0712  TROPONINI <0.30 <0.30   BNP (last 3 results)  Recent Labs  05/05/14 1610  PROBNP 535.7*   Studies: Dg Chest 2 View  05/05/2014   CLINICAL DATA:  Right shoulder pain status post fall history of previous tobacco use  EXAM: CHEST  2 VIEW  COMPARISON:  Portable chest x-ray of June 11, 2012 and June 09, 2012  FINDINGS: The right lung is adequately inflated and clear. There is chronically increased density at the left lung base likely reflecting a large hiatal hernia -partially intrathoracic stomach. The pulmonary vascularity is not engorged. There  is stable significant dextroscoliosis of the mid thoracic spine. There are degenerative changes of the left shoulder. There is an acute impacted, comminuted, and displaced fracture of the humeral head on the right.  IMPRESSION: 1. There is no active cardiopulmonary disease. 2. There is an acute fracture of the humeral head and neck on the right. 3. A large hiatal hernia-partially intrathoracic stomach is suspected and appears stable.   Electronically Signed   By: David  Martinique   On: 05/05/2014 17:04   Dg Shoulder Right  05/05/2014   CLINICAL DATA:  Fall.  Right shoulder injury and pain.  EXAM: RIGHT SHOULDER - 2+ VIEW  COMPARISON:  None.  FINDINGS: A highly comminuted and displaced fracture is seen involving the humeral head and neck. No evidence of dislocation. Generalized osteopenia noted. Degenerative changes also seen involving the glenohumeral and acromioclavicular joints.  IMPRESSION: Comminuted and displaced fracture involving the humeral head and neck.   Electronically Signed   By: Earle Gell M.D.   On: 05/05/2014 17:00   Dg Elbow 2 Views Right  05/05/2014   CLINICAL DATA:  Fall.  Right elbow injury and pain.  EXAM: RIGHT ELBOW - 2 VIEW  COMPARISON:  None.  FINDINGS: There is no evidence of acute fracture, dislocation, or joint effusion. Osteoarthritis seen mainly involving the lateral joint space. Soft tissues are unremarkable.  IMPRESSION: No acute findings.  Degenerative joint disease.   Electronically Signed   By: Earle Gell M.D.   On: 05/05/2014 17:02   Ct Head Wo Contrast  05/05/2014   CLINICAL DATA:  Status post fall with dizziness and right shoulder injury  EXAM: CT HEAD WITHOUT CONTRAST  CT CERVICAL SPINE WITHOUT CONTRAST  TECHNIQUE: Multidetector CT imaging of the head and cervical spine was performed following the standard protocol without intravenous contrast. Multiplanar CT image reconstructions of the cervical spine were also generated.  COMPARISON:  Noncontrast CT scan of brain dated  June 09, 2012  FINDINGS: CT HEAD FINDINGS  The ventricles are normal in size and position. There is mild age appropriate diffuse cerebral and cerebellar atrophy. There is decreased density in the deep white matter of both cerebral hemispheres consistent with chronic small vessel ischemic type change. An old lacunar infarction in the anterior limb of the right internal capsule is present. There is no acute intracranial hemorrhage.  At bone window settings the observed portions of the paranasal sinuses and mastoid air cells are clear. There is no acute skull fracture.  CT CERVICAL SPINE FINDINGS  There is mild loss of the normal cervical lordosis. The vertebral bodies are preserved in height. There is disc space narrowing at C3-4 through C6-7. Large anterior and smaller posterior osteophytes are present. The prevertebral soft tissue spaces are normal. There is facet joint hypertrophy at multiple levels. The odontoid is intact. The bony  ring at each cervical level is normal.  The pulmonary apices are clear.  IMPRESSION: 1. There is no evidence of an acute ischemic or hemorrhagic event. 2. There are stable age related small vessel ischemic changes. 3. There is no acute cervical spine fracture nor dislocation. Significant degenerative disc and facet joint changes are present at multiple levels.   Electronically Signed   By: David  Martinique   On: 05/05/2014 17:19   Ct Cervical Spine Wo Contrast  05/05/2014   CLINICAL DATA:  Status post fall with dizziness and right shoulder injury  EXAM: CT HEAD WITHOUT CONTRAST  CT CERVICAL SPINE WITHOUT CONTRAST  TECHNIQUE: Multidetector CT imaging of the head and cervical spine was performed following the standard protocol without intravenous contrast. Multiplanar CT image reconstructions of the cervical spine were also generated.  COMPARISON:  Noncontrast CT scan of brain dated June 09, 2012  FINDINGS: CT HEAD FINDINGS  The ventricles are normal in size and position. There is mild age  appropriate diffuse cerebral and cerebellar atrophy. There is decreased density in the deep white matter of both cerebral hemispheres consistent with chronic small vessel ischemic type change. An old lacunar infarction in the anterior limb of the right internal capsule is present. There is no acute intracranial hemorrhage.  At bone window settings the observed portions of the paranasal sinuses and mastoid air cells are clear. There is no acute skull fracture.  CT CERVICAL SPINE FINDINGS  There is mild loss of the normal cervical lordosis. The vertebral bodies are preserved in height. There is disc space narrowing at C3-4 through C6-7. Large anterior and smaller posterior osteophytes are present. The prevertebral soft tissue spaces are normal. There is facet joint hypertrophy at multiple levels. The odontoid is intact. The bony ring at each cervical level is normal.  The pulmonary apices are clear.  IMPRESSION: 1. There is no evidence of an acute ischemic or hemorrhagic event. 2. There are stable age related small vessel ischemic changes. 3. There is no acute cervical spine fracture nor dislocation. Significant degenerative disc and facet joint changes are present at multiple levels.   Electronically Signed   By: David  Martinique   On: 05/05/2014 17:19   Ct Shoulder Right Wo Contrast  05/05/2014   CLINICAL DATA:  Known right shoulder fracture  EXAM: CT OF THE RIGHT SHOULDER WITHOUT CONTRAST  TECHNIQUE: Multidetector CT imaging was performed according to the standard protocol. Multiplanar CT image reconstructions were also generated.  COMPARISON:  Right shoulder two-view series of today's date  FINDINGS: There is a comminuted impacted fracture of the humeral head and neck. There is a small free bone fragment just inferior to the acromion. There are pre-existing degenerative changes of the articular cortex of the humeral head. The bony glenoid exhibits degenerative change and a minimally distracted fracture of the  anterior lip. There are degenerative changes of the [facility] joint. The acromion and visualized portions of the clavicle exhibit no acute abnormalities. There is degenerative change of the right sternoclavicular joint. The body of the scapula is intact where visualized.  There is a joint effusion.  There is a hematoma deep to the deltoid.  IMPRESSION: 1. There is a comminuted impacted fracture of the humeral head and neck. 2. There is a minimally distracted fracture of the anterior lip of the glenoid. 3. There is a hematoma deep to the deltoid muscle.   Electronically Signed   By: David  Martinique   On: 05/05/2014 21:01   Dg Humerus Right  05/05/2014   CLINICAL DATA:  Pain post trauma  EXAM: RIGHT HUMERUS - 2+ VIEW  COMPARISON:  None.  FINDINGS: Frontal, Y scapular, and axillary views were obtained. There is a comminuted fracture of the proximal humeral metaphysis with marked displacement of the humeral head inferior to the humeral shaft with rotation and multiple fragments surrounding the proximal humeral shaft. There is no gross dislocation. Bones appear osteoporotic.  IMPRESSION: Extensively comminuted fracture of the proximal right humerus near the epiphysis -metaphysis junction with marked rotation and inferior displacement of the humeral head with respect to the remainder the bone.   Electronically Signed   By: Lowella Grip M.D.   On: 05/05/2014 17:02    Scheduled Meds: . calcium-vitamin D  2 tablet Oral BID  . enoxaparin (LOVENOX) injection  40 mg Subcutaneous Q24H  . loratadine  10 mg Oral Daily  . pantoprazole  40 mg Oral Daily   Continuous Infusions: . sodium chloride 50 mL/hr at 05/06/14 0400    Active Problems:   GERD   Leukocytosis   Hyponatremia   Dizziness   Fall   UTI (urinary tract infection)   Essential hypertension, benign   Humerus fracture  Time spent: 35  This note has been created with Surveyor, quantity. Any transcriptional  errors are unintentional.   Marzetta Board, MD [facility] Pager [phone]. If 7 PM - 7 AM, please contact night-coverage at [email or web address], password [facility] 05/06/2014, 8:04 AM  LOS: 1 day

## 2014-05-06 NOTE — Progress Notes (Signed)
  Echocardiogram 2D Echocardiogram has been performed.  [name] 05/06/2014, 9:14 AM

## 2014-05-06 NOTE — Progress Notes (Signed)
Pt has sling on Rt. Arm and elevated on pillow. Hand warm to touch can move fingers, no edema noted in the hand. Pt rating her pain in Rt. Arm 5-7. Appetite fair. Family at bedside. Power of attorney papers brought in by son and copy put in chart. Dsg to Rt. Shift clean and dry. Pt c/o a lot of gas, belching frequently. Mylicon given. Repositioned q 2 hr.

## 2014-05-06 NOTE — Plan of Care (Signed)
Problem: Phase I Progression Outcomes Goal: OOB as tolerated unless otherwise ordered Outcome: Progressing Ordered for bedrest today

## 2014-05-06 NOTE — Progress Notes (Signed)
Pt has tried to void three times without results.  On fourth try she voided 250.  She states she has not voided since fall around 2 pm yesterday.  Bladder scan showed about 500 left in bladder after she did void.  Left message with on call NP for possible in and out cath.  Waiting for return call. In and out cath done in ED due to her being unable to void at that time too.

## 2014-05-06 NOTE — Consult Note (Signed)
Reason for Consult: Right proximal humerus fracture Referring Physician:  Cruzita Lederer, MD  [name] is an 80 y.o. female.  HPI: [name] is a 80 y.o. female with h/o MR, hyperlipidemia, hypertension, comes in for persistent dizziness since three weeks and a fall today. As per the patient, she reports the above symptoms and fell today. She denies loss of consciousness. She reports pain in the right arm On arrival to ED, she was found to have leukocytosis,. Her x rays of the right arm revealed comminuted fracture of the humeral head and neck. Her right arm was put in the sling and she was referred to medical service for admission for dizziness. She also reported that she had pedal edema since many months and her PCP started her on lasix three weeks and since then she has been feeling dizziness. She currently denies any other complaints other than pain in the right arm.   Orthopaedics consulted. [name] patient of mine for lower extremity issues  Review of Systems:  See HPI otherwise negative.    Past Medical History  Diagnosis Date  . Hyperlipidemia   . Mitral regurgitation     mild by echo 2003  . Allergic rhinitis   . Actinic keratosis   . Tenosynovitis     myxomatous  . Chronic constipation   . GERD (gastroesophageal reflux disease)   . Osteoarthritis   . Back pain   . Macular degeneration   . Hypertension   . Osteoarthrosis   . Osteoporosis   . Hyponatremia   . Leg edema     Past Surgical History  Procedure Laterality Date  . Synovectomy      of right long finger  . Hernial repain    . Inguinal hernia repair      laparoscopic preperitoneal repair of bilateral inguinal hernias  . Carpal tunnel release      bilateral  . Replacement total knee bilateral    . Tonisellectomy    . Tonisillectomy    . Tonsillectomy      Family History  Problem Relation Age of Onset  . Heart attack Father   . Heart attack Brother     Social History:  reports that she  has quit smoking. Her smoking use included Cigarettes. She has a 2 pack-year smoking history. She does not have any smokeless tobacco history on file. She reports that she drinks about 1.2 ounces of alcohol per week. She reports that she does not use illicit drugs.  Allergies:  Allergies  Allergen Reactions  . Aspirin     Unknown  . Boniva [Ibandronic Acid]     Unknown  . Morphine     Unknown  . Nitrofuran Derivatives     Unknown  . Penicillins     Unknown  . Sulfonamide Derivatives     Unknown    Medications:  I have reviewed the patient's current medications. Scheduled: . calcium-vitamin D  2 tablet Oral BID  . enoxaparin (LOVENOX) injection  40 mg Subcutaneous Q24H  . loratadine  10 mg Oral Daily  . pantoprazole  40 mg Oral Daily    Results for orders placed during the hospital encounter of 05/05/14 (from the past 24 hour(s))  CBC WITH DIFFERENTIAL     Status: Abnormal   Collection Time    05/05/14  4:10 PM      Result Value Ref Range   WBC 15.0 (*) 4.0 - 10.5 K/uL   RBC 3.78 (*) 3.87 - 5.11 MIL/uL  Hemoglobin 12.0  12.0 - 15.0 g/dL   HCT 34.6 (*) 36.0 - 46.0 %   MCV 91.5  78.0 - 100.0 fL   MCH 31.7  26.0 - 34.0 pg   MCHC 34.7  30.0 - 36.0 g/dL   RDW 12.4  11.5 - 15.5 %   Platelets 369  150 - 400 K/uL   Neutrophils Relative % 89 (*) 43 - 77 %   Lymphocytes Relative 7 (*) 12 - 46 %   Monocytes Relative 4  3 - 12 %   Eosinophils Relative 0  0 - 5 %   Basophils Relative 0  0 - 1 %   Neutro Abs 13.3 (*) 1.7 - 7.7 K/uL   Lymphs Abs 1.1  0.7 - 4.0 K/uL   Monocytes Absolute 0.6  0.1 - 1.0 K/uL   Eosinophils Absolute 0.0  0.0 - 0.7 K/uL   Basophils Absolute 0.0  0.0 - 0.1 K/uL   Smear Review LARGE PLATELETS PRESENT    BASIC METABOLIC PANEL     Status: Abnormal   Collection Time    05/05/14  4:10 PM      Result Value Ref Range   Sodium 131 (*) 137 - 147 mEq/L   Potassium 4.1  3.7 - 5.3 mEq/L   Chloride 96  96 - 112 mEq/L   CO2 21  19 - 32 mEq/L   Glucose, Bld  142 (*) 70 - 99 mg/dL   BUN 23  6 - 23 mg/dL   Creatinine, Ser 0.61  0.50 - 1.10 mg/dL   Calcium 9.2  8.4 - 10.5 mg/dL   GFR calc non Af Amer 80 (*) >90 mL/min   GFR calc Af Amer >90  >90 mL/min  PRO B NATRIURETIC PEPTIDE     Status: Abnormal   Collection Time    05/05/14  4:10 PM      Result Value Ref Range   Pro B Natriuretic peptide (BNP) 535.7 (*) 0 - 450 pg/mL  I-STAT TROPOININ, ED     Status: None   Collection Time    05/05/14  4:32 PM      Result Value Ref Range   Troponin i, poc 0.02  0.00 - 0.08 ng/mL   Comment 3           URINALYSIS, ROUTINE W REFLEX MICROSCOPIC     Status: Abnormal   Collection Time    05/05/14  8:14 PM      Result Value Ref Range   Color, Urine YELLOW  YELLOW   APPearance CLEAR  CLEAR   Specific Gravity, Urine 1.017  1.005 - 1.030   pH 6.0  5.0 - 8.0   Glucose, UA NEGATIVE  NEGATIVE mg/dL   Hgb urine dipstick NEGATIVE  NEGATIVE   Bilirubin Urine NEGATIVE  NEGATIVE   Ketones, ur NEGATIVE  NEGATIVE mg/dL   Protein, ur NEGATIVE  NEGATIVE mg/dL   Urobilinogen, UA 0.2  0.0 - 1.0 mg/dL   Nitrite NEGATIVE  NEGATIVE   Leukocytes, UA TRACE (*) NEGATIVE  URINE MICROSCOPIC-ADD ON     Status: None   Collection Time    05/05/14  8:14 PM      Result Value Ref Range   Squamous Epithelial / LPF RARE  RARE   WBC, UA 7-10  <3 WBC/hpf   Bacteria, UA RARE  RARE   Urine-Other RARE YEAST    TROPONIN I     Status: None   Collection Time    05/06/14 12:41 AM  Result Value Ref Range   Troponin I <0.30  <0.30 ng/mL  TROPONIN I     Status: None   Collection Time    05/06/14  7:12 AM      Result Value Ref Range   Troponin I <0.30  <0.30 ng/mL    X-ray: CLINICAL DATA: Fall. Right shoulder injury and pain.  EXAM:  RIGHT SHOULDER - 2+ VIEW  COMPARISON: None.  FINDINGS:  A highly comminuted and displaced fracture is seen involving the  humeral head and neck. No evidence of dislocation. Generalized  osteopenia noted. Degenerative changes also seen  involving the  glenohumeral and acromioclavicular joints.  IMPRESSION:  Comminuted and displaced fracture involving the humeral head and  neck.  Electronically Signed  By: Earle Gell M.D.   CLINICAL DATA: Known right shoulder fracture  EXAM:  CT OF THE RIGHT SHOULDER WITHOUT CONTRAST  TECHNIQUE:  Multidetector CT imaging was performed according to the standard  protocol. Multiplanar CT image reconstructions were also generated.  COMPARISON: Right shoulder two-view series of today's date  FINDINGS:  There is a comminuted impacted fracture of the humeral head and  neck. There is a small free bone fragment just inferior to the  acromion. There are pre-existing degenerative changes of the  articular cortex of the humeral head. The bony glenoid exhibits  degenerative change and a minimally distracted fracture of the  anterior lip. There are degenerative changes of the [facility] joint. The  acromion and visualized portions of the clavicle exhibit no acute  abnormalities. There is degenerative change of the right  sternoclavicular joint. The body of the scapula is intact where  visualized.  There is a joint effusion. There is a hematoma deep to the deltoid.  IMPRESSION:  1. There is a comminuted impacted fracture of the humeral head and  neck.  2. There is a minimally distracted fracture of the anterior lip of  the glenoid.  3. There is a hematoma deep to the deltoid muscle.  Electronically Signed  By: David Martinique  On: 05/05/2014 21:01   ROS: as per admitting H&P, dizziness  Blood pressure 176/91, pulse 77, temperature 98.3 F (36.8 C), temperature source Oral, resp. rate 20, height 5\' 1"  (1.549 m), weight 69.718 kg (153 lb 11.2 oz), SpO2 95.00%.  Physical Exam Awake alert General medical exam reviewed and noted  Right arm in sling, pain with movement, NVI  Assessment/Plan: Comminuted right proximal humerus fracture  Will ask Dr. Veverly Fells to review radiographic studies to  determine if she would be best treated non-operatively versus operatively Regular diet today Plan to follow pending medical clearance  Mauri Pole 05/06/2014, 7:58 AM

## 2014-05-06 NOTE — Progress Notes (Signed)
   Subjective: Right proximal humerus fracture  Patient reports pain as mild, controlled. No events throughout the night. Denies any CP or SOB. Denies any hip pain.  States that she became dizzy and fell and caught herself with her right arm.  Objective:   VITALS:   Filed Vitals:   05/06/14 0614  BP: 176/91  Pulse: 77  Temp: 98.3 F (36.8 C)  Resp: 20    Neurovascular intact No cellulitis present Compartment soft Good sensation and motion of the fingers of the right ha   LABS  Recent Labs  05/05/14 1610  HGB 12.0  HCT 34.6*  WBC 15.0*  PLT 369     Recent Labs  05/05/14 1610  NA 131*  K 4.1  BUN 23  CREATININE 0.61  GLUCOSE 142*     Assessment/Plan: Right proximal humerus fracture    She is under the care of medicine do the the dizzy even that caused her to fall.  She denies any hip pain on exam, previous THA.  Maintain sling on the right arm until further evaluation. Discussed that we will talk with one of the shoulder doctors in the group to evaluate her humerus fx.       West Pugh Gerrard Crystal   PAC  05/06/2014, 10:22 AM

## 2014-05-07 LAB — CBC
HEMATOCRIT: 30.1 % — AB (ref 36.0–46.0)
Hemoglobin: 10.1 g/dL — ABNORMAL LOW (ref 12.0–15.0)
MCH: 31.1 pg (ref 26.0–34.0)
MCHC: 33.6 g/dL (ref 30.0–36.0)
MCV: 92.6 fL (ref 78.0–100.0)
Platelets: 275 10*3/uL (ref 150–400)
RBC: 3.25 MIL/uL — ABNORMAL LOW (ref 3.87–5.11)
RDW: 12.4 % (ref 11.5–15.5)
WBC: 11.2 10*3/uL — ABNORMAL HIGH (ref 4.0–10.5)

## 2014-05-07 LAB — URINE CULTURE
Colony Count: NO GROWTH
Culture: NO GROWTH

## 2014-05-07 LAB — BASIC METABOLIC PANEL
BUN: 10 mg/dL (ref 6–23)
CHLORIDE: 96 meq/L (ref 96–112)
CO2: 26 meq/L (ref 19–32)
Calcium: 9 mg/dL (ref 8.4–10.5)
Creatinine, Ser: 0.45 mg/dL — ABNORMAL LOW (ref 0.50–1.10)
GFR calc non Af Amer: 88 mL/min — ABNORMAL LOW (ref >90)
GLUCOSE: 102 mg/dL — AB (ref 70–99)
Potassium: 3.7 mEq/L (ref 3.7–5.3)
Sodium: 133 mEq/L — ABNORMAL LOW (ref 137–147)

## 2014-05-07 MED ORDER — SENNOSIDES-DOCUSATE SODIUM 8.6-50 MG PO TABS
2.0000 | ORAL_TABLET | Freq: Two times a day (BID) | ORAL | Status: DC
  Administered 2014-05-07 – 2014-05-10 (×7): 2 via ORAL
  Filled 2014-05-07 (×8): qty 2

## 2014-05-07 MED ORDER — GLYCERIN (LAXATIVE) 2.1 G RE SUPP
1.0000 | Freq: Every day | RECTAL | Status: DC | PRN
  Filled 2014-05-07: qty 1

## 2014-05-07 NOTE — Consult Note (Signed)
Reason for Consult:right proximal humerus fracture Referring Physician: Alvan Dame  HPI: [name] is an 81 y.o. female living at home with her son as primary caregiver and POA, fell this weekend sustaining severely displaced and comminuted right proximal humerus fracture. Has been under the care of Dr. 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  Past Medical History  Diagnosis Date  . Hyperlipidemia   . Mitral regurgitation     mild by echo 2003  . Allergic rhinitis   . Actinic keratosis   . Tenosynovitis     myxomatous  . Chronic constipation   . GERD (gastroesophageal reflux disease)   . Osteoarthritis   . Back pain   . Macular degeneration   . Hypertension   . Osteoarthrosis   . Osteoporosis   . Hyponatremia   . Leg edema     Past Surgical History  Procedure Laterality Date  . Synovectomy      of right long finger  . Hernial repain    . Inguinal hernia repair      laparoscopic preperitoneal repair of bilateral inguinal hernias  . Carpal tunnel release      bilateral  . Replacement total knee bilateral    . Tonisellectomy    . Tonisillectomy    . Tonsillectomy      Family History  Problem Relation Age of Onset  . Heart attack Father   . Heart attack Brother     Social History:  reports that she has quit smoking. Her smoking use included Cigarettes. She has a 2 pack-year smoking history. She does not have any smokeless tobacco history on file. She reports that she drinks about 1.2 ounces of alcohol per week. She reports that she does not use illicit drugs.  Allergies:  Allergies  Allergen Reactions  . Aspirin     Unknown  . Boniva [Ibandronic Acid]     Unknown  . Morphine     Unknown  . Nitrofuran Derivatives     Unknown  . Penicillins     Unknown  . Sulfonamide Derivatives     Unknown    Medications: I have reviewed the patient's current medications.  Results for orders placed during the  hospital encounter of 05/05/14 (from the past 48 hour(s))  CBC WITH DIFFERENTIAL     Status: Abnormal   Collection Time    05/05/14  4:10 PM      Result Value Ref Range   WBC 15.0 (*) 4.0 - 10.5 K/uL   RBC 3.78 (*) 3.87 - 5.11 MIL/uL   Hemoglobin 12.0  12.0 - 15.0 g/dL   HCT 34.6 (*) 36.0 - 46.0 %   MCV 91.5  78.0 - 100.0 fL   MCH 31.7  26.0 - 34.0 pg   MCHC 34.7  30.0 - 36.0 g/dL   RDW 12.4  11.5 - 15.5 %   Platelets 369  150 - 400 K/uL   Neutrophils Relative % 89 (*) 43 - 77 %   Lymphocytes Relative 7 (*) 12 - 46 %   Monocytes Relative 4  3 - 12 %   Eosinophils Relative 0  0 - 5 %   Basophils Relative 0  0 - 1 %   Neutro Abs 13.3 (*) 1.7 - 7.7 K/uL   Lymphs Abs 1.1  0.7 - 4.0 K/uL   Monocytes Absolute 0.6  0.1 - 1.0 K/uL   Eosinophils Absolute 0.0  0.0 - 0.7 K/uL   Basophils  Absolute 0.0  0.0 - 0.1 K/uL   Smear Review LARGE PLATELETS PRESENT    BASIC METABOLIC PANEL     Status: Abnormal   Collection Time    05/05/14  4:10 PM      Result Value Ref Range   Sodium 131 (*) 137 - 147 mEq/L   Potassium 4.1  3.7 - 5.3 mEq/L   Chloride 96  96 - 112 mEq/L   CO2 21  19 - 32 mEq/L   Glucose, Bld 142 (*) 70 - 99 mg/dL   BUN 23  6 - 23 mg/dL   Creatinine, Ser 0.61  0.50 - 1.10 mg/dL   Calcium 9.2  8.4 - 10.5 mg/dL   GFR calc non Af Amer 80 (*) >90 mL/min   GFR calc Af Amer >90  >90 mL/min   Comment: (NOTE)     The eGFR has been calculated using the CKD EPI equation.     This calculation has not been validated in all clinical situations.     eGFR's persistently <90 mL/min signify possible Chronic Kidney     Disease.  PRO B NATRIURETIC PEPTIDE     Status: Abnormal   Collection Time    05/05/14  4:10 PM      Result Value Ref Range   Pro B Natriuretic peptide (BNP) 535.7 (*) 0 - 450 pg/mL  I-STAT TROPOININ, ED     Status: None   Collection Time    05/05/14  4:32 PM      Result Value Ref Range   Troponin i, poc 0.02  0.00 - 0.08 ng/mL   Comment 3            Comment: Due to the  release kinetics of cTnI,     a negative result within the first hours     of the onset of symptoms does not rule out     myocardial infarction with certainty.     If myocardial infarction is still suspected,     repeat the test at appropriate intervals.  URINALYSIS, ROUTINE W REFLEX MICROSCOPIC     Status: Abnormal   Collection Time    05/05/14  8:14 PM      Result Value Ref Range   Color, Urine YELLOW  YELLOW   APPearance CLEAR  CLEAR   Specific Gravity, Urine 1.017  1.005 - 1.030   pH 6.0  5.0 - 8.0   Glucose, UA NEGATIVE  NEGATIVE mg/dL   Hgb urine dipstick NEGATIVE  NEGATIVE   Bilirubin Urine NEGATIVE  NEGATIVE   Ketones, ur NEGATIVE  NEGATIVE mg/dL   Protein, ur NEGATIVE  NEGATIVE mg/dL   Urobilinogen, UA 0.2  0.0 - 1.0 mg/dL   Nitrite NEGATIVE  NEGATIVE   Leukocytes, UA TRACE (*) NEGATIVE  URINE MICROSCOPIC-ADD ON     Status: None   Collection Time    05/05/14  8:14 PM      Result Value Ref Range   Squamous Epithelial / LPF RARE  RARE   WBC, UA 7-10  <3 WBC/hpf   Bacteria, UA RARE  RARE   Urine-Other RARE YEAST    TROPONIN I     Status: None   Collection Time    05/06/14 12:41 AM      Result Value Ref Range   Troponin I <0.30  <0.30 ng/mL   Comment:            Due to the release kinetics of cTnI,     a negative  result within the first hours     of the onset of symptoms does not rule out     myocardial infarction with certainty.     If myocardial infarction is still suspected,     repeat the test at appropriate intervals.  TROPONIN I     Status: None   Collection Time    05/06/14  7:12 AM      Result Value Ref Range   Troponin I <0.30  <0.30 ng/mL   Comment:            Due to the release kinetics of cTnI,     a negative result within the first hours     of the onset of symptoms does not rule out     myocardial infarction with certainty.     If myocardial infarction is still suspected,     repeat the test at appropriate intervals.  TROPONIN I     Status:  None   Collection Time    05/06/14 12:52 PM      Result Value Ref Range   Troponin I <0.30  <0.30 ng/mL   Comment:            Due to the release kinetics of cTnI,     a negative result within the first hours     of the onset of symptoms does not rule out     myocardial infarction with certainty.     If myocardial infarction is still suspected,     repeat the test at appropriate intervals.  BASIC METABOLIC PANEL     Status: Abnormal   Collection Time    05/06/14 12:52 PM      Result Value Ref Range   Sodium 131 (*) 137 - 147 mEq/L   Potassium 4.0  3.7 - 5.3 mEq/L   Chloride 95 (*) 96 - 112 mEq/L   CO2 25  19 - 32 mEq/L   Glucose, Bld 92  70 - 99 mg/dL   BUN 15  6 - 23 mg/dL   Creatinine, Ser 0.44 (*) 0.50 - 1.10 mg/dL   Calcium 8.8  8.4 - 10.5 mg/dL   GFR calc non Af Amer 89 (*) >90 mL/min   GFR calc Af Amer >90  >90 mL/min   Comment: (NOTE)     The eGFR has been calculated using the CKD EPI equation.     This calculation has not been validated in all clinical situations.     eGFR's persistently <90 mL/min signify possible Chronic Kidney     Disease.  BASIC METABOLIC PANEL     Status: Abnormal   Collection Time    05/07/14  4:53 AM      Result Value Ref Range   Sodium 133 (*) 137 - 147 mEq/L   Potassium 3.7  3.7 - 5.3 mEq/L   Chloride 96  96 - 112 mEq/L   CO2 26  19 - 32 mEq/L   Glucose, Bld 102 (*) 70 - 99 mg/dL   BUN 10  6 - 23 mg/dL   Creatinine, Ser 0.45 (*) 0.50 - 1.10 mg/dL   Calcium 9.0  8.4 - 10.5 mg/dL   GFR calc non Af Amer 88 (*) >90 mL/min   GFR calc Af Amer >90  >90 mL/min   Comment: (NOTE)     The eGFR has been calculated using the CKD EPI equation.     This calculation has not been validated in all clinical situations.  eGFR's persistently <90 mL/min signify possible Chronic Kidney     Disease.  CBC     Status: Abnormal   Collection Time    05/07/14  4:53 AM      Result Value Ref Range   WBC 11.2 (*) 4.0 - 10.5 K/uL   RBC 3.25 (*) 3.87 - 5.11  MIL/uL   Hemoglobin 10.1 (*) 12.0 - 15.0 g/dL   HCT 30.1 (*) 36.0 - 46.0 %   MCV 92.6  78.0 - 100.0 fL   MCH 31.1  26.0 - 34.0 pg   MCHC 33.6  30.0 - 36.0 g/dL   RDW 12.4  11.5 - 15.5 %   Platelets 275  150 - 400 K/uL   Comment: SPECIMEN CHECKED FOR CLOTS     DELTA CHECK NOTED    Dg Chest 2 View  05/05/2014   CLINICAL DATA:  Right shoulder pain status post fall history of previous tobacco use  EXAM: CHEST  2 VIEW  COMPARISON:  Portable chest x-ray of June 11, 2012 and June 09, 2012  FINDINGS: The right lung is adequately inflated and clear. There is chronically increased density at the left lung base likely reflecting a large hiatal hernia -partially intrathoracic stomach. The pulmonary vascularity is not engorged. There is stable significant dextroscoliosis of the mid thoracic spine. There are degenerative changes of the left shoulder. There is an acute impacted, comminuted, and displaced fracture of the humeral head on the right.  IMPRESSION: 1. There is no active cardiopulmonary disease. 2. There is an acute fracture of the humeral head and neck on the right. 3. A large hiatal hernia-partially intrathoracic stomach is suspected and appears stable.   Electronically Signed   By: David  [place]   On: 05/05/2014 17:04   Dg Shoulder Right  05/05/2014   CLINICAL DATA:  Fall.  Right shoulder injury and pain.  EXAM: RIGHT SHOULDER - 2+ VIEW  COMPARISON:  None.  FINDINGS: A highly comminuted and displaced fracture is seen involving the humeral head and neck. No evidence of dislocation. Generalized osteopenia noted. Degenerative changes also seen involving the glenohumeral and acromioclavicular joints.  IMPRESSION: Comminuted and displaced fracture involving the humeral head and neck.   Electronically Signed   By: Earle Gell M.D.   On: 05/05/2014 17:00   Dg Elbow 2 Views Right  05/05/2014   CLINICAL DATA:  Fall.  Right elbow injury and pain.  EXAM: RIGHT ELBOW - 2 VIEW  COMPARISON:  None.  FINDINGS: There  is no evidence of acute fracture, dislocation, or joint effusion. Osteoarthritis seen mainly involving the lateral joint space. Soft tissues are unremarkable.  IMPRESSION: No acute findings.  Degenerative joint disease.   Electronically Signed   By: Earle Gell M.D.   On: 05/05/2014 17:02   Ct Head Wo Contrast  05/05/2014   CLINICAL DATA:  Status post fall with dizziness and right shoulder injury  EXAM: CT HEAD WITHOUT CONTRAST  CT CERVICAL SPINE WITHOUT CONTRAST  TECHNIQUE: Multidetector CT imaging of the head and cervical spine was performed following the standard protocol without intravenous contrast. Multiplanar CT image reconstructions of the cervical spine were also generated.  COMPARISON:  Noncontrast CT scan of brain dated June 09, 2012  FINDINGS: CT HEAD FINDINGS  The ventricles are normal in size and position. There is mild age appropriate diffuse cerebral and cerebellar atrophy. There is decreased density in the deep white matter of both cerebral hemispheres consistent with chronic small vessel ischemic type change. An old  lacunar infarction in the anterior limb of the right internal capsule is present. There is no acute intracranial hemorrhage.  At bone window settings the observed portions of the paranasal sinuses and mastoid air cells are clear. There is no acute skull fracture.  CT CERVICAL SPINE FINDINGS  There is mild loss of the normal cervical lordosis. The vertebral bodies are preserved in height. There is disc space narrowing at C3-4 through C6-7. Large anterior and smaller posterior osteophytes are present. The prevertebral soft tissue spaces are normal. There is facet joint hypertrophy at multiple levels. The odontoid is intact. The bony ring at each cervical level is normal.  The pulmonary apices are clear.  IMPRESSION: 1. There is no evidence of an acute ischemic or hemorrhagic event. 2. There are stable age related small vessel ischemic changes. 3. There is no acute cervical spine  fracture nor dislocation. Significant degenerative disc and facet joint changes are present at multiple levels.   Electronically Signed   By: David  [place]   On: 05/05/2014 17:19   Ct Cervical Spine Wo Contrast  05/05/2014   CLINICAL DATA:  Status post fall with dizziness and right shoulder injury  EXAM: CT HEAD WITHOUT CONTRAST  CT CERVICAL SPINE WITHOUT CONTRAST  TECHNIQUE: Multidetector CT imaging of the head and cervical spine was performed following the standard protocol without intravenous contrast. Multiplanar CT image reconstructions of the cervical spine were also generated.  COMPARISON:  Noncontrast CT scan of brain dated June 09, 2012  FINDINGS: CT HEAD FINDINGS  The ventricles are normal in size and position. There is mild age appropriate diffuse cerebral and cerebellar atrophy. There is decreased density in the deep white matter of both cerebral hemispheres consistent with chronic small vessel ischemic type change. An old lacunar infarction in the anterior limb of the right internal capsule is present. There is no acute intracranial hemorrhage.  At bone window settings the observed portions of the paranasal sinuses and mastoid air cells are clear. There is no acute skull fracture.  CT CERVICAL SPINE FINDINGS  There is mild loss of the normal cervical lordosis. The vertebral bodies are preserved in height. There is disc space narrowing at C3-4 through C6-7. Large anterior and smaller posterior osteophytes are present. The prevertebral soft tissue spaces are normal. There is facet joint hypertrophy at multiple levels. The odontoid is intact. The bony ring at each cervical level is normal.  The pulmonary apices are clear.  IMPRESSION: 1. There is no evidence of an acute ischemic or hemorrhagic event. 2. There are stable age related small vessel ischemic changes. 3. There is no acute cervical spine fracture nor dislocation. Significant degenerative disc and facet joint changes are present at multiple  levels.   Electronically Signed   By: David  [place]   On: 05/05/2014 17:19   Ct Shoulder Right Wo Contrast  05/05/2014   CLINICAL DATA:  Known right shoulder fracture  EXAM: CT OF THE RIGHT SHOULDER WITHOUT CONTRAST  TECHNIQUE: Multidetector CT imaging was performed according to the standard protocol. Multiplanar CT image reconstructions were also generated.  COMPARISON:  Right shoulder two-view series of today's date  FINDINGS: There is a comminuted impacted fracture of the humeral head and neck. There is a small free bone fragment just inferior to the acromion. There are pre-existing degenerative changes of the articular cortex of the humeral head. The bony glenoid exhibits degenerative change and a minimally distracted fracture of the anterior lip. There are degenerative changes of the [facility] joint. The  acromion and visualized portions of the clavicle exhibit no acute abnormalities. There is degenerative change of the right sternoclavicular joint. The body of the scapula is intact where visualized.  There is a joint effusion.  There is a hematoma deep to the deltoid.  IMPRESSION: 1. There is a comminuted impacted fracture of the humeral head and neck. 2. There is a minimally distracted fracture of the anterior lip of the glenoid. 3. There is a hematoma deep to the deltoid muscle.   Electronically Signed   By: David  [place]   On: 05/05/2014 21:01   Dg Humerus Right  05/05/2014   CLINICAL DATA:  Pain post trauma  EXAM: RIGHT HUMERUS - 2+ VIEW  COMPARISON:  None.  FINDINGS: Frontal, Y scapular, and axillary views were obtained. There is a comminuted fracture of the proximal humeral metaphysis with marked displacement of the humeral head inferior to the humeral shaft with rotation and multiple fragments surrounding the proximal humeral shaft. There is no gross dislocation. Bones appear osteoporotic.  IMPRESSION: Extensively comminuted fracture of the proximal right humerus near the epiphysis -metaphysis junction with  marked rotation and inferior displacement of the humeral head with respect to the remainder the bone.   Electronically Signed   By: Lowella Grip M.D.   On: 05/05/2014 17:02     Vitals Temp:  [97.6 F (36.4 C)-98 F (36.7 C)] 97.7 F (36.5 C) (06/08 0700) Pulse Rate:  [71-101] 72 (06/08 0700) Resp:  [15-20] 15 (06/08 0700) BP: (147-178)/(76-86) 178/86 mmHg (06/08 0700) SpO2:  [95 %-98 %] 97 % (06/08 0521) Body mass index is 29.06 kg/(m^2).  Physical Exam: A and O, cooperative, c/o right shoulder and arm pain. Right shoulder diffusely swollen, tender, intact to light touch sensation. Compartments soft, bandage around elbow from reported abrasion/laceration, n/v intact distally.     Assessment/Plan: Impression: severely displaced and comminuted right proximal humerus fracture with underlying chronic right shoulder arthrosis Treatment: I have spoken with patient and her son [name]) regarding treatment options and risks vs benefits there of. They understand and accept and agree with plan for reverse shoulder arthroplasty, tentatively scheduled for noon tomorrow at [facility]. I have spoken with Dr. Cruzita Lederer who will arrange for transfer to hospitalist service at [facility].  [name] [name] 05/07/2014, 12:07 PM

## 2014-05-07 NOTE — Progress Notes (Signed)
Called [facility] [place], Report taken. Geraldo Docker, Student-RN

## 2014-05-07 NOTE — Progress Notes (Signed)
Clinical Social Work Department BRIEF PSYCHOSOCIAL ASSESSMENT 05/07/2014  Patient:  [name], [name]     Account Number:  [id number]     Admit date:  05/05/2014  Clinical Social Worker:  Earlie Server  Date/Time:  05/07/2014 02:30 PM  Referred by:  Physician  Date Referred:  05/07/2014 Referred for  SNF Placement   Other Referral:   Interview type:  Patient Other interview type:    PSYCHOSOCIAL DATA Living Status:  FAMILY Admitted from facility:   Level of care:   Primary support name:  Ted Primary support relationship to patient:  CHILD, ADULT Degree of support available:   Strong    CURRENT CONCERNS Current Concerns  Post-Acute Placement   Other Concerns:    SOCIAL WORK ASSESSMENT / PLAN CSW received referral in order to assist with DC planning. CSW reviewed chart and met with patient at bedside. CSW introduced myself and explained role. Patient had a friend that was visiting but reports that they have been friends for over 67 years and is happy to have her involved in assessment.    Patient reports that she lives at home with son. Patient's son works during the day and reports that she was doing well home alone during the day but is concerned about her safety now. Patient reports she worked with PT and is understanding that [place] [facility] might be needed. CSW provided SNF list and explained Medicare coverage for SNF placement. Patient has been to [facility] in the past and would prefer to return there if needed.    CSW completed FL2 and faxed out. CSW encouraged patient to review list and have alternative options in case [facility] is unable to offer. CSW will follow up with bed offers.   Assessment/plan status:  Psychosocial Support/Ongoing Assessment of Needs Other assessment/ plan:   Information/referral to community resources:   SNF list    PATIENT'S/FAMILY'S RESPONSE TO PLAN OF CARE: Patient alert and oriented. Patient engaged in assessment and reports that she is familiar  with process. Patient reports that all of her friends and family live nearby [facility] and will be able to visit. Patient reports it is important to have friends involved so that she remains positive. Patient thanked CSW for information and reports she will update son on DC plans after surgery.       [place], [place] [phone]

## 2014-05-07 NOTE — Progress Notes (Signed)
PROGRESS NOTE  [name] LTJ:[id number] DOB: 1926-07-10 DOA: 05/05/2014 PCP: Horatio Pel, MD  HPI: [name] comes in for persistent dizziness since three weeks and a fall today. As per the patient, she reports the above symptoms and fell today. She denies loss of consciousness. She reports pain in the right arm On arrival to ED, she was found to have leukocytosis,. Her x rays of the right arm revealed comminuted fracture of the humeral head and neck. Her right arm was put in the sling and she was referred to medical service for admission for dizziness. She also reported that she had pedal edema since many months and her PCP started her on lasix three weeks and since then she has been feeling dizziness. She currently denies any other complaints other than pain in the right arm. Orthopedics was consulted by EDP, who will see th epatient in the morning.   Assessment/Plan: Dizziness and fall:  - Admited to telemetry. Her dizziness started since starting her Lasix 2 weeks ago for LE swelling.  - stop Lasix, add back Avapro on which she was up until 2 weeks ago - CEs negative, 2D echo as below.   HTN - add back Avapro since she is hypertensive this morning   Hyponatremia:  - ? Dehydration. Gentle hydration and repeat bmp today.   Right humerus fractures:  Right arm in sling. Pain control and orthopedics consulted.  - has nausea with dilaudid, will avoid and use fentanyl which she tolerates - Dr. Onnie Graham plans arthroplasty at [facility] tomorrow, transfer tonight to the hospitalist service over there.   Leukocytosis:  - she is afebrile, UA with leukocytes and patient with dysuria symptoms, will start Ciprofloxacin (allergy to penicillin), send urine culture - CXR without evidence of infection - urine culture pending.   Diet: regular Fluids: NS KVO DVT Prophylaxis: Lovenox  Code Status: Full Family Communication: d/w patient and son Disposition Plan:  inpatient  Consultants:  Orthopedic surgery   Procedures:  2D echo Study Conclusions - Left ventricle: The cavity size was normal. Wall thickness was increased in a pattern of moderate LVH. Systolic function was normal. The estimated ejection fraction was in the range of 55% to 60%. Wall motion was normal; there were no regional wall motion abnormalities. Doppler parameters are consistent with abnormal left ventricular relaxation (grade 1 diastolic dysfunction). The E/e&' ratio is between 8-15, suggesting indeterminate LV filling pressure. - Aortic valve: Trileaflet; mildly thickened leaflets. There was no stenosis. There was no regurgitation. Valve area (Vmax): 2.89 cm^2. - Left atrium: The atrium was normal in size.    Antibiotics Ciprofloxacin 6/7 >>  HPI/Subjective: - feeling better this morning, right shoulder pain still  Objective: Filed Vitals:   05/06/14 0614 05/06/14 1416 05/06/14 2057 05/07/14 0521  BP: 176/91 147/78 152/76 178/83  Pulse: 77 101 78 71  Temp: 98.3 F (36.8 C) 98 F (36.7 C) 97.6 F (36.4 C) 97.8 F (36.6 C)  TempSrc: Oral Oral Oral Oral  Resp: 20 18 18 20   Height:      Weight:      SpO2: 95% 98% 95% 97%    Intake/Output Summary (Last 24 hours) at 05/07/14 0719 Last data filed at 05/07/14 0600  Gross per 24 hour  Intake 2648.33 ml  Output   1675 ml  Net 973.33 ml   Filed Weights   05/05/14 2146  Weight: 69.718 kg (153 lb 11.2 oz)    Exam:  General:  NAD, right arm in sling  Cardiovascular: regular rate and rhythm, without MRG  Respiratory: good air movement, clear to auscultation throughout, no wheezing, ronchi or rales  Abdomen: soft, not tender to palpation, positive bowel sounds  MSK: trace peripheral edema  Neuro: non focal  Data Reviewed: Basic Metabolic Panel:  Recent Labs Lab 05/05/14 1610 05/06/14 1252 05/07/14 0453  NA 131* 131* 133*  K 4.1 4.0 3.7  CL 96 95* 96  CO2 21 25 26   GLUCOSE 142* 92 102*  BUN 23 15  10   CREATININE 0.61 0.44* 0.45*  CALCIUM 9.2 8.8 9.0   CBC:  Recent Labs Lab 05/05/14 1610 05/07/14 0453  WBC 15.0* 11.2*  NEUTROABS 13.3*  --   HGB 12.0 10.1*  HCT 34.6* 30.1*  MCV 91.5 92.6  PLT 369 275   Cardiac Enzymes:  Recent Labs Lab 05/06/14 0041 05/06/14 0712 05/06/14 1252  TROPONINI <0.30 <0.30 <0.30   BNP (last 3 results)  Recent Labs  05/05/14 1610  PROBNP 535.7*   Studies: Dg Chest 2 View  05/05/2014   CLINICAL DATA:  Right shoulder pain status post fall history of previous tobacco use  EXAM: CHEST  2 VIEW  COMPARISON:  Portable chest x-ray of June 11, 2012 and June 09, 2012  FINDINGS: The right lung is adequately inflated and clear. There is chronically increased density at the left lung base likely reflecting a large hiatal hernia -partially intrathoracic stomach. The pulmonary vascularity is not engorged. There is stable significant dextroscoliosis of the mid thoracic spine. There are degenerative changes of the left shoulder. There is an acute impacted, comminuted, and displaced fracture of the humeral head on the right.  IMPRESSION: 1. There is no active cardiopulmonary disease. 2. There is an acute fracture of the humeral head and neck on the right. 3. A large hiatal hernia-partially intrathoracic stomach is suspected and appears stable.   Electronically Signed   By: David  Martinique   On: 05/05/2014 17:04   Dg Shoulder Right  05/05/2014   CLINICAL DATA:  Fall.  Right shoulder injury and pain.  EXAM: RIGHT SHOULDER - 2+ VIEW  COMPARISON:  None.  FINDINGS: A highly comminuted and displaced fracture is seen involving the humeral head and neck. No evidence of dislocation. Generalized osteopenia noted. Degenerative changes also seen involving the glenohumeral and acromioclavicular joints.  IMPRESSION: Comminuted and displaced fracture involving the humeral head and neck.   Electronically Signed   By: Earle Gell M.D.   On: 05/05/2014 17:00   Dg Elbow 2 Views  Right  05/05/2014   CLINICAL DATA:  Fall.  Right elbow injury and pain.  EXAM: RIGHT ELBOW - 2 VIEW  COMPARISON:  None.  FINDINGS: There is no evidence of acute fracture, dislocation, or joint effusion. Osteoarthritis seen mainly involving the lateral joint space. Soft tissues are unremarkable.  IMPRESSION: No acute findings.  Degenerative joint disease.   Electronically Signed   By: Earle Gell M.D.   On: 05/05/2014 17:02   Ct Head Wo Contrast  05/05/2014   CLINICAL DATA:  Status post fall with dizziness and right shoulder injury  EXAM: CT HEAD WITHOUT CONTRAST  CT CERVICAL SPINE WITHOUT CONTRAST  TECHNIQUE: Multidetector CT imaging of the head and cervical spine was performed following the standard protocol without intravenous contrast. Multiplanar CT image reconstructions of the cervical spine were also generated.  COMPARISON:  Noncontrast CT scan of brain dated June 09, 2012  FINDINGS: CT HEAD FINDINGS  The ventricles are normal in size and position. There is  mild age appropriate diffuse cerebral and cerebellar atrophy. There is decreased density in the deep white matter of both cerebral hemispheres consistent with chronic small vessel ischemic type change. An old lacunar infarction in the anterior limb of the right internal capsule is present. There is no acute intracranial hemorrhage.  At bone window settings the observed portions of the paranasal sinuses and mastoid air cells are clear. There is no acute skull fracture.  CT CERVICAL SPINE FINDINGS  There is mild loss of the normal cervical lordosis. The vertebral bodies are preserved in height. There is disc space narrowing at C3-4 through C6-7. Large anterior and smaller posterior osteophytes are present. The prevertebral soft tissue spaces are normal. There is facet joint hypertrophy at multiple levels. The odontoid is intact. The bony ring at each cervical level is normal.  The pulmonary apices are clear.  IMPRESSION: 1. There is no evidence of an acute  ischemic or hemorrhagic event. 2. There are stable age related small vessel ischemic changes. 3. There is no acute cervical spine fracture nor dislocation. Significant degenerative disc and facet joint changes are present at multiple levels.   Electronically Signed   By: David  Martinique   On: 05/05/2014 17:19   Ct Cervical Spine Wo Contrast  05/05/2014   CLINICAL DATA:  Status post fall with dizziness and right shoulder injury  EXAM: CT HEAD WITHOUT CONTRAST  CT CERVICAL SPINE WITHOUT CONTRAST  TECHNIQUE: Multidetector CT imaging of the head and cervical spine was performed following the standard protocol without intravenous contrast. Multiplanar CT image reconstructions of the cervical spine were also generated.  COMPARISON:  Noncontrast CT scan of brain dated June 09, 2012  FINDINGS: CT HEAD FINDINGS  The ventricles are normal in size and position. There is mild age appropriate diffuse cerebral and cerebellar atrophy. There is decreased density in the deep white matter of both cerebral hemispheres consistent with chronic small vessel ischemic type change. An old lacunar infarction in the anterior limb of the right internal capsule is present. There is no acute intracranial hemorrhage.  At bone window settings the observed portions of the paranasal sinuses and mastoid air cells are clear. There is no acute skull fracture.  CT CERVICAL SPINE FINDINGS  There is mild loss of the normal cervical lordosis. The vertebral bodies are preserved in height. There is disc space narrowing at C3-4 through C6-7. Large anterior and smaller posterior osteophytes are present. The prevertebral soft tissue spaces are normal. There is facet joint hypertrophy at multiple levels. The odontoid is intact. The bony ring at each cervical level is normal.  The pulmonary apices are clear.  IMPRESSION: 1. There is no evidence of an acute ischemic or hemorrhagic event. 2. There are stable age related small vessel ischemic changes. 3. There is no  acute cervical spine fracture nor dislocation. Significant degenerative disc and facet joint changes are present at multiple levels.   Electronically Signed   By: David  Martinique   On: 05/05/2014 17:19   Ct Shoulder Right Wo Contrast  05/05/2014   CLINICAL DATA:  Known right shoulder fracture  EXAM: CT OF THE RIGHT SHOULDER WITHOUT CONTRAST  TECHNIQUE: Multidetector CT imaging was performed according to the standard protocol. Multiplanar CT image reconstructions were also generated.  COMPARISON:  Right shoulder two-view series of today's date  FINDINGS: There is a comminuted impacted fracture of the humeral head and neck. There is a small free bone fragment just inferior to the acromion. There are pre-existing degenerative changes of  the articular cortex of the humeral head. The bony glenoid exhibits degenerative change and a minimally distracted fracture of the anterior lip. There are degenerative changes of the [facility] joint. The acromion and visualized portions of the clavicle exhibit no acute abnormalities. There is degenerative change of the right sternoclavicular joint. The body of the scapula is intact where visualized.  There is a joint effusion.  There is a hematoma deep to the deltoid.  IMPRESSION: 1. There is a comminuted impacted fracture of the humeral head and neck. 2. There is a minimally distracted fracture of the anterior lip of the glenoid. 3. There is a hematoma deep to the deltoid muscle.   Electronically Signed   By: David  Martinique   On: 05/05/2014 21:01   Dg Humerus Right  05/05/2014   CLINICAL DATA:  Pain post trauma  EXAM: RIGHT HUMERUS - 2+ VIEW  COMPARISON:  None.  FINDINGS: Frontal, Y scapular, and axillary views were obtained. There is a comminuted fracture of the proximal humeral metaphysis with marked displacement of the humeral head inferior to the humeral shaft with rotation and multiple fragments surrounding the proximal humeral shaft. There is no gross dislocation. Bones appear  osteoporotic.  IMPRESSION: Extensively comminuted fracture of the proximal right humerus near the epiphysis -metaphysis junction with marked rotation and inferior displacement of the humeral head with respect to the remainder the bone.   Electronically Signed   By: Lowella Grip M.D.   On: 05/05/2014 17:02    Scheduled Meds: . calcium-vitamin D  2 tablet Oral BID  . cetirizine  10 mg Oral QHS   And  . cetirizine  5 mg Oral Daily  . ciprofloxacin  400 mg Intravenous Q12H  . enoxaparin (LOVENOX) injection  40 mg Subcutaneous Q24H  . irbesartan  75 mg Oral Daily  . pantoprazole  40 mg Oral Daily  . senna-docusate  1 tablet Oral BID   Continuous Infusions: . sodium chloride 50 mL/hr at 05/07/14 0600    Active Problems:   GERD   Leukocytosis   Hyponatremia   Dizziness   Fall   UTI (urinary tract infection)   Essential hypertension, benign   Humerus fracture  Time spent: 35  This note has been created with Surveyor, quantity. Any transcriptional errors are unintentional.   Marzetta Board, MD [facility] Pager [phone]. If 7 PM - 7 AM, please contact night-coverage at [email or web address], password [facility] 05/07/2014, 7:19 AM  LOS: 2 days

## 2014-05-07 NOTE — Progress Notes (Addendum)
Clinical Social Work Department CLINICAL SOCIAL WORK PLACEMENT NOTE 05/07/2014  Patient:  [name], [name]  Account Number:  [id number] Admit date:  05/05/2014  Clinical Social Worker:  Sindy Messing, LCSW  Date/time:  05/07/2014 02:30 PM  Clinical Social Work is seeking post-discharge placement for this patient at the following level of care:   SKILLED NURSING   (*CSW will update this form in Epic as items are completed)   05/07/2014  Patient/family provided with [place] Department of Clinical Social Work's list of facilities offering this level of care within the geographic area requested by the patient (or if unable, by the patient's family).  05/07/2014  Patient/family informed of their freedom to choose among providers that offer the needed level of care, that participate in Medicare, Medicaid or managed care program needed by the patient, have an available bed and are willing to accept the patient.  05/07/2014  Patient/family informed of [facility]' ownership interest in [facility], as well as of the fact that they are under no obligation to receive care at this facility.  PASARR submitted to EDS on existing # PASARR number received on   FL2 transmitted to all facilities in geographic area requested by pt/family on  05/07/2014 FL2 transmitted to all facilities within larger geographic area on   Patient informed that his/her managed care company has contracts with or will negotiate with  certain facilities, including the following:     Patient/family informed of bed offers received: 05/10/14  Patient chooses bed at [facility] Physician recommends and patient chooses bed at    Patient to be transferred to [place] on 05/10/14   Patient to be transferred to facility by Ambulance  [facility])  Patient and family notified of transfer on 05/10/14/   Name of family member notified:  Rozann Lesches-- daughter  The following physician request  were entered in Epic:   Additional Comments: Earlie Server [phone]  05/10/14  Ok per MD for d/c today to SNF. CSW met with patient and spoke to her daughter Tonia Ghent via phone. Patient is very excited about going to [facility] and denied any concerns or questions. She stated that she has been a resident there several times so is very familiar with the placement process and denies any concerns or questions. Nursing notified to call report. CSW signing off Lorie Phenix. [place], [place]

## 2014-05-07 NOTE — Progress Notes (Signed)
Patient ID: [name], female   DOB: 1926/05/24, 80 y.o.   MRN: [id number]  Right proximal humerus fracture, comminuted Associated with dizziness at presentation  Have asked Dr. Onnie Graham to review her studies to provide disposition  For now maintain arm in sling for comfort I will provide disposition as soon as I hear back from [facility]  NWB Right upper extremity

## 2014-05-07 NOTE — Progress Notes (Signed)
Pt c/o lower abd. Pain.  Checked f/c for occulusion as there was no urine in bag.  Milked line for a while and pt had 1100 of urine drain out.  Changed drainage bag and appears to be draining properly.  Pt still c/o mild cramping and some burning at times.  Urine still light color but cloudy.

## 2014-05-07 NOTE — Evaluation (Signed)
Physical Therapy Evaluation Patient Details Name: [name] MRN: [id number] DOB: 08-Jan-1926 Today's Date: 05/07/2014   History of Present Illness  80yo female adm 05/05/14 after fall resulting in R comminuted humerus fx --decision regarding surgery still pending at time of this eval; PMHx: OA,  back pain, HTN, Bil TKAs  Clinical Impression  Pt will benefit from PT to address deficits below; surgery right UE pending input from Dr. Onnie Graham (per DR. Alvan Dame that did initial consult); Pt will likely need  SNF, she ambulated  with walker at her baseline and lives with her son but he does work; Will continue to follow;    Follow Up Recommendations SNF;Supervision/Assistance - 24 hour    Equipment Recommendations  None recommended by PT    Recommendations for Other Services       Precautions / Restrictions Precautions Precautions: Fall;Shoulder Shoulder Interventions: Shoulder sling/immobilizer Restrictions Weight Bearing Restrictions: Yes RUE Weight Bearing: Non weight bearing Other Position/Activity Restrictions: right shoulder immobilizer adjusted in bed and further adjjusted in sitting as it was placed incorrectly and  with very odd placement of straps; reviewed correct placement and NWB on R UE with pt      Mobility  Bed Mobility Overal bed mobility: Needs Assistance Bed Mobility: Supine to Sit     Supine to sit: Min assist;Mod assist     General bed mobility comments: cues for technique, NWB on RUE  Transfers Overall transfer level: Needs assistance Equipment used: 1 person hand held assist Transfers: Sit to/from [facility] Sit to Stand: Mod assist;+2 physical assistance;+2 safety/equipment;From elevated surface Stand pivot transfers: +2 physical assistance;Min assist       General transfer comment: cues for wt shift and hand placement, to control descent; pivotal steps to chair; pt fearful of falling  Ambulation/Gait                 Stairs            Wheelchair Mobility    Modified Rankin (Stroke Patients Only)       Balance Overall balance assessment: Needs assistance;History of Falls Sitting-balance support: Single extremity supported;No upper extremity supported Sitting balance-[name]: Fair     Standing balance support: Single extremity supported Standing balance-[name]: Poor                               Pertinent Vitals/Pain decr c/o pain after immobilizer put on correctly and pt able to relax into support of sling; pt was premedicated    Home Living Family/patient expects to be discharged to:: Unsure Living Arrangements: Children (with son)   Type of Home: House Home Access: Stairs to enter   [employer] of Steps: 3 Home Layout: Multi-level;Able to live on main level with bedroom/bathroom Home Equipment: Gilford Rile - 2 wheels;Bedside commode Additional Comments: amb with RW at baseline    Prior Function Level of Independence: Independent with assistive device(s)         Comments: son assist with meals, cleaning, son works     Journalist, newspaper        Extremity/Trunk Assessment   Upper Extremity Assessment: RUE deficits/detail   RUE: Unable to fully assess due to immobilization       Lower Extremity Assessment: Generalized weakness         Communication   Communication: No difficulties  Cognition Arousal/Alertness: Awake/alert Behavior During Therapy: WFL for tasks assessed/performed Overall Cognitive Status: Within Functional Limits for  tasks assessed                      General Comments      Exercises        Assessment/Plan    PT Assessment Patient needs continued PT services  PT Diagnosis Difficulty walking;Generalized weakness   PT Problem List Decreased strength;Decreased activity tolerance;Decreased balance;Decreased mobility;Decreased knowledge of precautions  PT Treatment Interventions DME instruction;Gait  training;Functional mobility training;Therapeutic activities;Therapeutic exercise;Patient/family education   PT Goals (Current goals can be found in the Care Plan section) Acute Rehab PT Goals PT Goal Formulation: With patient Time For Goal Achievement: 05/14/14 Potential to Achieve Goals: Good    Frequency Min 3X/week   Barriers to discharge        Co-evaluation               End of Session Equipment Utilized During Treatment: Gait belt;Other (comment) Activity Tolerance: Patient limited by fatigue Patient left: in chair;with call bell/phone within reach Nurse Communication: Mobility status         Time: 1601-0932 PT Time Calculation (min): 23 min   Charges:   PT Evaluation $Initial PT Evaluation Tier I: 1 Procedure PT Treatments $Therapeutic Activity: 23-37 mins   PT G Codes:          Neil Crouch 05/07/2014, 11:38 AM

## 2014-05-08 ENCOUNTER — Inpatient Hospital Stay (HOSPITAL_COMMUNITY): Payer: Medicare Other | Admitting: Anesthesiology

## 2014-05-08 ENCOUNTER — Encounter (HOSPITAL_COMMUNITY): Payer: Medicare Other | Admitting: Anesthesiology

## 2014-05-08 ENCOUNTER — Encounter (HOSPITAL_COMMUNITY): Payer: Self-pay | Admitting: Anesthesiology

## 2014-05-08 ENCOUNTER — Encounter (HOSPITAL_COMMUNITY)
Admission: EM | Disposition: A | Discharge status: Skilled Nursing Facility | Payer: Self-pay | Source: Home / Self Care | Attending: Internal Medicine

## 2014-05-08 DIAGNOSIS — I10 Essential (primary) hypertension: Secondary | ICD-10-CM

## 2014-05-08 HISTORY — PX: REVERSE SHOULDER ARTHROPLASTY: SHX5054

## 2014-05-08 LAB — SURGICAL PCR SCREEN
MRSA, PCR: NEGATIVE
Staphylococcus aureus: NEGATIVE

## 2014-05-08 SURGERY — ARTHROPLASTY, SHOULDER, TOTAL, REVERSE
Anesthesia: General | Site: Shoulder | Laterality: Right

## 2014-05-08 MED ORDER — ONDANSETRON HCL 4 MG/2ML IJ SOLN
4.0000 mg | Freq: Four times a day (QID) | INTRAMUSCULAR | Status: DC | PRN
  Administered 2014-05-08 – 2014-05-09 (×2): 4 mg via INTRAVENOUS
  Filled 2014-05-08 (×2): qty 2

## 2014-05-08 MED ORDER — OXYCODONE-ACETAMINOPHEN 5-325 MG PO TABS
1.0000 | ORAL_TABLET | ORAL | Status: DC | PRN
  Administered 2014-05-08 (×2): 2 via ORAL
  Administered 2014-05-09: 1 via ORAL
  Administered 2014-05-09 (×2): 2 via ORAL
  Administered 2014-05-10 (×2): 1 via ORAL
  Filled 2014-05-08: qty 2
  Filled 2014-05-08: qty 1
  Filled 2014-05-08 (×2): qty 2

## 2014-05-08 MED ORDER — DIPHENHYDRAMINE HCL 12.5 MG/5ML PO ELIX: 12.5000 mg | ORAL_SOLUTION | ORAL | Status: DC | PRN

## 2014-05-08 MED ORDER — CEFAZOLIN SODIUM 1-5 GM-% IV SOLN
1.0000 g | Freq: Four times a day (QID) | INTRAVENOUS | Status: AC
  Administered 2014-05-08 – 2014-05-09 (×3): 1 g via INTRAVENOUS
  Filled 2014-05-08 (×2): qty 50

## 2014-05-08 MED ORDER — LABETALOL HCL 5 MG/ML IV SOLN
INTRAVENOUS | Status: DC | PRN
  Administered 2014-05-08: 10 mg via INTRAVENOUS

## 2014-05-08 MED ORDER — ONDANSETRON HCL 4 MG/2ML IJ SOLN: 4.0000 mg | Freq: Once | INTRAMUSCULAR | Status: DC | PRN

## 2014-05-08 MED ORDER — LACTATED RINGERS IV SOLN: INTRAVENOUS | Status: DC

## 2014-05-08 MED ORDER — ONDANSETRON HCL 4 MG PO TABS: 4.0000 mg | ORAL_TABLET | Freq: Four times a day (QID) | ORAL | Status: DC | PRN

## 2014-05-08 MED ORDER — WHITE PETROLATUM GEL
Status: AC
  Administered 2014-05-08: 23:00:00
  Filled 2014-05-08: qty 5

## 2014-05-08 MED ORDER — LACTATED RINGERS IV SOLN
INTRAVENOUS | Status: DC | PRN
  Administered 2014-05-08: 12:00:00 via INTRAVENOUS

## 2014-05-08 MED ORDER — PHENOL 1.4 % MT LIQD
1.0000 | OROMUCOSAL | Status: DC | PRN
  Filled 2014-05-08: qty 177

## 2014-05-08 MED ORDER — FENTANYL CITRATE 0.05 MG/ML IJ SOLN: 25.0000 ug | INTRAMUSCULAR | Status: DC | PRN

## 2014-05-08 MED ORDER — ROCURONIUM BROMIDE 100 MG/10ML IV SOLN
INTRAVENOUS | Status: DC | PRN
  Administered 2014-05-08: 25 mg via INTRAVENOUS

## 2014-05-08 MED ORDER — PROPOFOL 10 MG/ML IV BOLUS
INTRAVENOUS | Status: AC
  Filled 2014-05-08: qty 20

## 2014-05-08 MED ORDER — BUPIVACAINE-EPINEPHRINE (PF) 0.5% -1:200000 IJ SOLN
INTRAMUSCULAR | Status: AC
  Filled 2014-05-08: qty 30

## 2014-05-08 MED ORDER — NEOSTIGMINE METHYLSULFATE 10 MG/10ML IV SOLN
INTRAVENOUS | Status: DC | PRN
  Administered 2014-05-08: 3 mg via INTRAVENOUS

## 2014-05-08 MED ORDER — DOCUSATE SODIUM 100 MG PO CAPS: 100.0000 mg | ORAL_CAPSULE | Freq: Two times a day (BID) | ORAL | Status: DC

## 2014-05-08 MED ORDER — ROCURONIUM BROMIDE 50 MG/5ML IV SOLN
INTRAVENOUS | Status: AC
  Filled 2014-05-08: qty 1

## 2014-05-08 MED ORDER — FLEET ENEMA 7-19 GM/118ML RE ENEM: 1.0000 | ENEMA | Freq: Once | RECTAL | Status: AC | PRN

## 2014-05-08 MED ORDER — BISACODYL 5 MG PO TBEC
5.0000 mg | DELAYED_RELEASE_TABLET | Freq: Every day | ORAL | Status: DC | PRN
Start: 2014-05-08 — End: 2014-05-10
  Administered 2014-05-09: 5 mg via ORAL
  Filled 2014-05-08: qty 1

## 2014-05-08 MED ORDER — FENTANYL CITRATE 0.05 MG/ML IJ SOLN
INTRAMUSCULAR | Status: AC
  Filled 2014-05-08: qty 5

## 2014-05-08 MED ORDER — LIDOCAINE HCL (CARDIAC) 20 MG/ML IV SOLN
INTRAVENOUS | Status: AC
  Filled 2014-05-08: qty 5

## 2014-05-08 MED ORDER — FENTANYL CITRATE 0.05 MG/ML IJ SOLN
INTRAMUSCULAR | Status: AC
Start: 2014-05-08 — End: 2014-05-08
  Administered 2014-05-08: 25 ug
  Filled 2014-05-08: qty 2

## 2014-05-08 MED ORDER — ACETAMINOPHEN 650 MG RE SUPP: 650.0000 mg | Freq: Four times a day (QID) | RECTAL | Status: DC | PRN

## 2014-05-08 MED ORDER — METOCLOPRAMIDE HCL 5 MG PO TABS
5.0000 mg | ORAL_TABLET | Freq: Three times a day (TID) | ORAL | Status: DC | PRN
  Filled 2014-05-08: qty 2

## 2014-05-08 MED ORDER — SUCCINYLCHOLINE CHLORIDE 20 MG/ML IJ SOLN
INTRAMUSCULAR | Status: AC
  Filled 2014-05-08: qty 1

## 2014-05-08 MED ORDER — LIDOCAINE HCL (CARDIAC) 20 MG/ML IV SOLN
INTRAVENOUS | Status: DC | PRN
  Administered 2014-05-08: 20 mg via INTRAVENOUS

## 2014-05-08 MED ORDER — POLYETHYLENE GLYCOL 3350 17 G PO PACK
17.0000 g | PACK | Freq: Every day | ORAL | Status: DC | PRN
  Administered 2014-05-09: 17 g via ORAL
  Filled 2014-05-08: qty 1

## 2014-05-08 MED ORDER — ONDANSETRON HCL 4 MG/2ML IJ SOLN
INTRAMUSCULAR | Status: DC | PRN
  Administered 2014-05-08 (×2): 4 mg via INTRAVENOUS

## 2014-05-08 MED ORDER — GLYCOPYRROLATE 0.2 MG/ML IJ SOLN
INTRAMUSCULAR | Status: DC | PRN
  Administered 2014-05-08: 0.4 mg via INTRAVENOUS

## 2014-05-08 MED ORDER — LACTATED RINGERS IV SOLN
INTRAVENOUS | Status: DC
  Administered 2014-05-08: 11:00:00 via INTRAVENOUS

## 2014-05-08 MED ORDER — BUPIVACAINE-EPINEPHRINE (PF) 0.25% -1:200000 IJ SOLN
INTRAMUSCULAR | Status: AC
  Filled 2014-05-08: qty 30

## 2014-05-08 MED ORDER — HYDROMORPHONE HCL PF 1 MG/ML IJ SOLN
0.2500 mg | INTRAMUSCULAR | Status: DC | PRN
  Administered 2014-05-08 – 2014-05-09 (×3): 1 mg via INTRAVENOUS
  Filled 2014-05-08 (×3): qty 1

## 2014-05-08 MED ORDER — METOCLOPRAMIDE HCL 5 MG/ML IJ SOLN
5.0000 mg | Freq: Three times a day (TID) | INTRAMUSCULAR | Status: DC | PRN
  Administered 2014-05-09: 10 mg via INTRAVENOUS
  Filled 2014-05-08: qty 2

## 2014-05-08 MED ORDER — ONDANSETRON HCL 4 MG/2ML IJ SOLN
INTRAMUSCULAR | Status: AC
  Filled 2014-05-08: qty 2

## 2014-05-08 MED ORDER — SODIUM CHLORIDE 0.9 % IR SOLN
Status: DC | PRN
  Administered 2014-05-08: 3000 mL

## 2014-05-08 MED ORDER — MENTHOL 3 MG MT LOZG
1.0000 | LOZENGE | OROMUCOSAL | Status: DC | PRN
  Filled 2014-05-08: qty 9

## 2014-05-08 MED ORDER — PHENYLEPHRINE HCL 10 MG/ML IJ SOLN
10.0000 mg | INTRAVENOUS | Status: DC | PRN
  Administered 2014-05-08: 50 ug/min via INTRAVENOUS

## 2014-05-08 MED ORDER — FENTANYL CITRATE 0.05 MG/ML IJ SOLN
INTRAMUSCULAR | Status: DC | PRN
  Administered 2014-05-08 (×4): 50 ug via INTRAVENOUS

## 2014-05-08 MED ORDER — CEFAZOLIN SODIUM-DEXTROSE 2-3 GM-% IV SOLR
INTRAVENOUS | Status: DC | PRN
  Administered 2014-05-08: 2 g via INTRAVENOUS

## 2014-05-08 MED ORDER — ALUM & MAG HYDROXIDE-SIMETH 200-200-20 MG/5ML PO SUSP
30.0000 mL | ORAL | Status: DC | PRN
  Filled 2014-05-08: qty 30

## 2014-05-08 MED ORDER — PROPOFOL 10 MG/ML IV BOLUS
INTRAVENOUS | Status: DC | PRN
  Administered 2014-05-08: 80 mg via INTRAVENOUS

## 2014-05-08 MED ORDER — ACETAMINOPHEN 325 MG PO TABS: 650.0000 mg | ORAL_TABLET | Freq: Four times a day (QID) | ORAL | Status: DC | PRN

## 2014-05-08 SURGICAL SUPPLY — 75 items
ADH SKN CLS LQ APL DERMABOND (GAUZE/BANDAGES/DRESSINGS) ×1
BIT DRILL 170X2.5X (BIT) IMPLANT
BIT DRL 170X2.5X (BIT)
BLADE SAW SGTL 83.5X18.5 (BLADE) ×3 IMPLANT
BRUSH FEMORAL CANAL (MISCELLANEOUS) IMPLANT
CAPT SHOULD DELTAXTEND CEM MOD ×3 IMPLANT
CEMENT BONE DEPUY (Cement) ×6 IMPLANT
CEMENT RESTRICTOR DEPUY SZ 3 (Cement) ×3 IMPLANT
CLOSURE WOUND 1/2 X4 (GAUZE/BANDAGES/DRESSINGS) ×1
COVER SURGICAL LIGHT HANDLE (MISCELLANEOUS) ×3 IMPLANT
DERMABOND ADHESIVE PROPEN (GAUZE/BANDAGES/DRESSINGS) ×2
DERMABOND ADVANCED .7 DNX6 (GAUZE/BANDAGES/DRESSINGS) ×1 IMPLANT
DRAPE INCISE IOBAN 66X45 STRL (DRAPES) ×3 IMPLANT
DRAPE SURG 17X11 SM STRL (DRAPES) ×3 IMPLANT
DRAPE U-SHAPE 47X51 STRL (DRAPES) ×3 IMPLANT
DRILL 2.5 (BIT)
DRILL BIT 7/64X5 (BIT) ×3 IMPLANT
DRSG AQUACEL AG ADV 3.5X10 (GAUZE/BANDAGES/DRESSINGS) ×3 IMPLANT
DRSG MEPILEX BORDER 4X8 (GAUZE/BANDAGES/DRESSINGS) IMPLANT
DURAPREP 26ML APPLICATOR (WOUND CARE) ×6 IMPLANT
ELECT BLADE 4.0 EZ CLEAN MEGAD (MISCELLANEOUS) ×3
ELECT CAUTERY BLADE 6.4 (BLADE) ×3 IMPLANT
ELECT REM PT RETURN 9FT ADLT (ELECTROSURGICAL) ×3
ELECTRODE BLDE 4.0 EZ CLN MEGD (MISCELLANEOUS) ×1 IMPLANT
ELECTRODE REM PT RTRN 9FT ADLT (ELECTROSURGICAL) ×1 IMPLANT
FACESHIELD WRAPAROUND (MASK) ×9 IMPLANT
GLOVE BIO SURGEON STRL SZ7.5 (GLOVE) ×3 IMPLANT
GLOVE BIO SURGEON STRL SZ8 (GLOVE) ×3 IMPLANT
GLOVE EUDERMIC 7 POWDERFREE (GLOVE) ×3 IMPLANT
GLOVE SS BIOGEL STRL SZ 7.5 (GLOVE) ×1 IMPLANT
GLOVE SUPERSENSE BIOGEL SZ 7.5 (GLOVE) ×2
GOWN STRL REUS W/ TWL LRG LVL3 (GOWN DISPOSABLE) IMPLANT
GOWN STRL REUS W/ TWL XL LVL3 (GOWN DISPOSABLE) ×2 IMPLANT
GOWN STRL REUS W/TWL LRG LVL3 (GOWN DISPOSABLE)
GOWN STRL REUS W/TWL XL LVL3 (GOWN DISPOSABLE) ×4
HANDPIECE INTERPULSE COAX TIP (DISPOSABLE)
KIT BASIN OR (CUSTOM PROCEDURE TRAY) ×3 IMPLANT
KIT ROOM TURNOVER OR (KITS) ×3 IMPLANT
MANIFOLD NEPTUNE II (INSTRUMENTS) ×3 IMPLANT
NDL SUT 6 .5 CRC .975X.05 MAYO (NEEDLE) IMPLANT
NEEDLE HYPO 25GX1X1/2 BEV (NEEDLE) IMPLANT
NEEDLE MAYO TAPER (NEEDLE)
NS IRRIG 1000ML POUR BTL (IV SOLUTION) ×3 IMPLANT
PACK SHOULDER (CUSTOM PROCEDURE TRAY) ×3 IMPLANT
PAD ARMBOARD 7.5X6 YLW CONV (MISCELLANEOUS) ×6 IMPLANT
PASSER SUT SWANSON 36MM LOOP (INSTRUMENTS) ×3 IMPLANT
PIN GUIDE 1.2 (PIN) IMPLANT
PIN GUIDE GLENOPHERE 1.5MX300M (PIN) IMPLANT
PIN METAGLENE 2.5 (PIN) IMPLANT
PRESSURIZER FEMORAL UNIV (MISCELLANEOUS) IMPLANT
SET HNDPC FAN SPRY TIP SCT (DISPOSABLE) IMPLANT
SLING ARM IMMOBILIZER LRG (SOFTGOODS) ×3 IMPLANT
SLING ARM LRG ADULT FOAM STRAP (SOFTGOODS) IMPLANT
SLING ARM XL FOAM STRAP (SOFTGOODS) ×3 IMPLANT
SPONGE LAP 18X18 X RAY DECT (DISPOSABLE) ×6 IMPLANT
SPONGE LAP 4X18 X RAY DECT (DISPOSABLE) IMPLANT
STRIP CLOSURE SKIN 1/2X4 (GAUZE/BANDAGES/DRESSINGS) ×2 IMPLANT
SUCTION FRAZIER TIP 10 FR DISP (SUCTIONS) ×3 IMPLANT
SUT BONE WAX W31G (SUTURE) IMPLANT
SUT FIBERWIRE #2 38 T-5 BLUE (SUTURE) ×12
SUT MNCRL AB 3-0 PS2 18 (SUTURE) ×3 IMPLANT
SUT VIC AB 1 CT1 27 (SUTURE) ×2
SUT VIC AB 1 CT1 27XBRD ANBCTR (SUTURE) ×1 IMPLANT
SUT VIC AB 2-0 CT1 27 (SUTURE) ×2
SUT VIC AB 2-0 CT1 TAPERPNT 27 (SUTURE) ×1 IMPLANT
SUT VIC AB 2-0 SH 27 (SUTURE)
SUT VIC AB 2-0 SH 27X BRD (SUTURE) IMPLANT
SUTURE FIBERWR #2 38 T-5 BLUE (SUTURE) ×4 IMPLANT
SYR 30ML SLIP (SYRINGE) ×3 IMPLANT
SYR CONTROL 10ML LL (SYRINGE) IMPLANT
TOWEL OR 17X24 6PK STRL BLUE (TOWEL DISPOSABLE) ×3 IMPLANT
TOWEL OR 17X26 10 PK STRL BLUE (TOWEL DISPOSABLE) ×3 IMPLANT
TOWER CARTRIDGE SMART MIX (DISPOSABLE) IMPLANT
TRAY FOLEY CATH 16FRSI W/METER (SET/KITS/TRAYS/PACK) IMPLANT
WATER STERILE IRR 1000ML POUR (IV SOLUTION) ×3 IMPLANT

## 2014-05-08 NOTE — ED Provider Notes (Signed)
Medical screening examination/treatment/procedure(s) were conducted as a shared visit with non-physician practitioner(s) and myself.  I personally evaluated the patient during the encounter.   Please see my separate note.     Houston Siren III, MD 05/08/14 [phone]

## 2014-05-08 NOTE — Progress Notes (Signed)
[facility] PROGRESS NOTE  [name] MOQ:[id number] DOB: [birth year] DOA: 05/05/2014 PCP: Horatio Pel, MD  Brief narrative 80 year old female with history of hypertension, hyperlipidemia, presented with persistent dizziness for 3 weeks duration with a fall on 6/ 6. He sustained a comminuted fracture of the right humerus. A sling was applied an orthopedic surgeon consulted. Patient her to [facility] and taken to OR on 6/9 for a right reverse shoulder arthroplasty .   Assessment/Plan Fall with dizziness Stable and angina PPD at likely dehydration secondary to Lasix started to recover for lower extremity swelling. Lasix has been discontinued. Continue avapro. Serial cardiac enzymes negative. 2-D echo shows normal EF with grade 1 diastolic dysfunction.  PT eval. Needs SNF.  Right humerus fracture Reverse arthroplasty   Hypertension Continue Avapro  Hyponatremia  Likely secondary to dehydration. improving with fluids  leucocytosis   on empiric cipro for dysuria symptoms with possible UTI. Will treat for 5 days.   DVT prophylaxis: Subcutaneous Lovenox   Code Status: full code Family Communication: will call Son to update  Disposition Plan: Needs skilled nursing facility.    Consultants:  Orthopedics (Dr. supple)  Procedures: Right reverse shoulder arthroplasty on 6/9   Antibiotics:  None  HPI/Subjective: Patient seen and examined this morning. complains of rt arm pain  Objective: Filed Vitals:   05/08/14 1419  BP:   Pulse: 77  Temp:   Resp: 15    Intake/Output Summary (Last 24 hours) at 05/08/14 1455 Last data filed at 05/08/14 1406  Gross per 24 hour  Intake   1170 ml  Output   2400 ml  Net  -1230 ml   Filed Weights   05/05/14 2146  Weight: 69.718 kg (153 lb 11.2 oz)    Exam:   General: Elderly female in no acute distress  HEENT:, No pallor, moist oral mucosa  Cardiovascular: Normal S1 and S2, no murmurs   Respiratory:  Clear to auscultation bilaterally, no added sounds  Abdomen: Soft, nontender, nondistended, bowel sounds present  Musculoskeletal: Warm, sling over right arm  CNS: AAOX3  Data Reviewed: Basic Metabolic Panel:  Recent Labs Lab 05/05/14 1610 05/06/14 1252 05/07/14 0453  NA 131* 131* 133*  K 4.1 4.0 3.7  CL 96 95* 96  CO2 21 25 26   GLUCOSE 142* 92 102*  BUN 23 15 10   CREATININE 0.61 0.44* 0.45*  CALCIUM 9.2 8.8 9.0   Liver Function Tests: No results found for this basename: AST, ALT, ALKPHOS, BILITOT, PROT, ALBUMIN,  in the last 168 hours No results found for this basename: LIPASE, AMYLASE,  in the last 168 hours No results found for this basename: AMMONIA,  in the last 168 hours CBC:  Recent Labs Lab 05/05/14 1610 05/07/14 0453  WBC 15.0* 11.2*  NEUTROABS 13.3*  --   HGB 12.0 10.1*  HCT 34.6* 30.1*  MCV 91.5 92.6  PLT 369 275   Cardiac Enzymes:  Recent Labs Lab 05/06/14 0041 05/06/14 0712 05/06/14 1252  TROPONINI <0.30 <0.30 <0.30   BNP (last 3 results)  Recent Labs  05/05/14 1610  PROBNP 535.7*   CBG: No results found for this basename: GLUCAP,  in the last 168 hours  Recent Results (from the past 240 hour(s))  URINE CULTURE     Status: None   Collection Time    05/06/14  9:41 AM      Result Value Ref Range Status   Specimen Description URINE, CLEAN CATCH   Final   Special Requests NONE  Final   Culture  Setup Time     Final   Value: 05/06/2014 21:03     Performed at [facility] Count     Final   Value: NO GROWTH     Performed at [facility]   Culture     Final   Value: NO GROWTH     Performed at [facility]   Report Status 05/07/2014 FINAL   Final  SURGICAL PCR SCREEN     Status: None   Collection Time    05/08/14  6:09 AM      Result Value Ref Range Status   MRSA, PCR NEGATIVE  NEGATIVE Final   Staphylococcus aureus NEGATIVE  NEGATIVE Final   Comment:            The Xpert SA Assay (FDA      approved for NASAL specimens     in patients over 86 years of age),     is one component of     a comprehensive surveillance     program.  Test performance has     been validated by [employer] for patients greater     than or equal to 61 year old.     It is not intended     to diagnose infection nor to     guide or monitor treatment.     Studies: No results found.  Scheduled Meds: . [MAR HOLD] calcium-vitamin D  2 tablet Oral BID  . [facility] HOLD] cetirizine  10 mg Oral QHS   And  . [MAR HOLD] cetirizine  5 mg Oral Daily  . [facility] HOLD] ciprofloxacin  400 mg Intravenous Q12H  . [MAR HOLD] enoxaparin (LOVENOX) injection  40 mg Subcutaneous Q24H  . [MAR HOLD] irbesartan  75 mg Oral Daily  . [MAR HOLD] pantoprazole  40 mg Oral Daily  . [facility] HOLD] senna-docusate  2 tablet Oral BID   Continuous Infusions: . sodium chloride 50 mL/hr at 05/07/14 0600  . lactated ringers 50 mL/hr at 05/08/14 1119      Time spent: 25 minutes    [name]  [facility] Pager [phone] If 7PM-7AM, please contact night-coverage at [email or web address], password [facility] 05/08/2014, 2:55 PM  LOS: 3 days

## 2014-05-08 NOTE — Anesthesia Preprocedure Evaluation (Signed)
Anesthesia Evaluation  Patient identified by MRN, date of birth, ID band Patient awake    Reviewed: Allergy & Precautions, H&P , NPO status , Patient's Chart, lab work & pertinent test results  Airway Mallampati: II TM Distance: >3 FB     Dental  (+) Edentulous Upper   Pulmonary former smoker,  breath sounds clear to auscultation        Cardiovascular hypertension, Rhythm:Regular Rate:Normal     Neuro/Psych    GI/Hepatic   Endo/Other    Renal/GU      Musculoskeletal   Abdominal   Peds  Hematology   Anesthesia Other Findings   Reproductive/Obstetrics                           Anesthesia Physical Anesthesia Plan  ASA: III  Anesthesia Plan: General   Post-op Pain Management:    Induction: Intravenous  Airway Management Planned: Oral ETT  Additional Equipment:   Intra-op Plan:   Post-operative Plan: Extubation in OR  Informed Consent: I have reviewed the patients History and Physical, chart, labs and discussed the procedure including the risks, benefits and alternatives for the proposed anesthesia with the patient or authorized representative who has indicated his/her understanding and acceptance.     Plan Discussed with: CRNA and Anesthesiologist  Anesthesia Plan Comments:         Anesthesia Quick Evaluation

## 2014-05-08 NOTE — Anesthesia Procedure Notes (Signed)
Anesthesia Regional Block:  Interscalene brachial plexus block  Pre-Anesthetic Checklist: ,, timeout performed, Correct Patient, Correct Site, Correct Laterality, Correct Procedure, Correct Position, site marked, Risks and benefits discussed,  Surgical consent,  Pre-op evaluation,  At surgeon's request and post-op pain management  Laterality: Right  Prep: chloraprep       Needles:  Injection technique: Single-shot  Needle Type: Echogenic Stimulator Needle     Needle Length: 9cm 9 cm Needle Gauge: 22 and 22 G    Additional Needles:  Procedures: nerve stimulator Interscalene brachial plexus block Narrative:  Start time: 05/08/2014 11:40 AM End time: 05/08/2014 11:45 AM Injection made incrementally with aspirations every 5 mL.  Performed by: Personally   Additional Notes: 15 cc 0.5% marcaine with 1:200 Epi injected easily

## 2014-05-08 NOTE — Discharge Instructions (Signed)
° °  Metta Clines. Supple, M.D., F.A.A.O.S. Orthopaedic Surgery Specializing in Arthroscopic and Reconstructive Surgery of the Shoulder and Knee [phone] [address] [place], [place] [address] - Fax [phone]   POST-OP TOTAL SHOULDER REPLACEMENT/SHOULDER HEMIARTHROPLASTY INSTRUCTIONS  1. Call the office at [phone] to schedule your first post-op appointment 10-14 days from the date of your surgery.  2. The bandage over your incision is waterproof. You may begin showering with this dressing on. You may leave this dressing on until first follow up appointment within 2 weeks. If you would like to remove it you may do so after the 5th day. Go slow and tug at the borders gently to break the bond the dressing has with the skin. The steri strips may come off with the dressing. At this point if there is no drainage it is okay to go without a bandage or you may cover it with a light guaze and tape. Leave the steri-strips in place over your incision. You can expect drainage that is bloody or yellow in nature that should gradually decrease from day of surgery. Change your dressing daily until drainage is completely resolved, then you may feel free to go without a bandage. You can also expect significant bruising around your shoulder that will drift down your arm and into your chest wall. This is very normal and should resolve over several days.   3. Wear your sling/immobilizer at all times except to perform the exercises below or to occasionally let your arm dangle by your side to stretch your elbow. You also need to sleep in your sling immobilizer until instructed otherwise.  4. Range of motion to your elbow, wrist, and hand are encouraged 3-5 times daily. Exercise to your hand and fingers helps to reduce swelling you may experience.  5. Utilize ice to the shoulder 3-5 times minimum a day and additionally if you are experiencing pain.  6. Prescriptions for a pain medication and a muscle  relaxant are provided for you. It is recommended that if you are experiencing pain that you pain medication alone is not controlling, add the muscle relaxant along with the pain medication which can give additional pain relief. The first 1-2 days is generally the most severe of your pain and then should gradually decrease. As your pain lessens it is recommended that you decrease your use of the pain medications to an "as needed basis'" only and to always comply with the recommended dosages of the pain medications.  7. Pain medications can produce constipation along with their use. If you experience this, the use of an over the counter stool softener or laxative daily is recommended.   8. For most patients, if insurance allows, home health services to include therapy has been arranged.  9. For additional questions or concerns, please do not hesitate to call the office. If after hours there is an answering service to forward your concerns to the physician on call.  POST-OP EXERCISES To be performed with therapist at facility

## 2014-05-08 NOTE — Op Note (Signed)
05/05/2014 - 05/08/2014  1:47 PM  PATIENT:   [name]  79 y.o. female  PRE-OPERATIVE DIAGNOSIS:  COMMINUTED, DISPLACED 4 PART RIGHT PROXIMAL HUMERUS FRACTURE  POST-OPERATIVE DIAGNOSIS:  same  PROCEDURE:  Right reverse shoulder arthroplasty #10 stem, +9 spacer, +9 poly, 38 eccentric glenosphere  SURGEON:  Kiarah Eckstein, Metta Clines M.D.  ASSISTANTS: Shuford pac   ANESTHESIA:   GET + ISB  EBL: 200  SPECIMEN:  none  Drains: none   PATIENT DISPOSITION:  PACU - hemodynamically stable.    PLAN OF CARE:  Admit to inpatient  Patient will require SNF post op, and son [name], [facility]) reports that she did well at [facility] after previous admission and they would like to return to [facility] if possible.  Dictation# [id number]

## 2014-05-08 NOTE — Progress Notes (Signed)
Patient has a bed at [facility] when medically stable.    [name], MSW, [place]

## 2014-05-08 NOTE — Transfer of Care (Signed)
Immediate Anesthesia Transfer of Care Note  Patient: [name]  Procedure(s) Performed: Procedure(s): REVERSE SHOULDER ARTHROPLASTY RIGHT  (Right)  Patient Location: PACU  Anesthesia Type:General and GA combined with regional for post-op pain  Level of Consciousness: awake, alert , oriented and patient cooperative  Airway & Oxygen Therapy: Patient Spontanous Breathing and Patient connected to nasal cannula oxygen  Post-op Assessment: Report given to PACU RN, Post -op Vital signs reviewed and stable and Patient moving all extremities  Post vital signs: Reviewed and stable  Complications: No apparent anesthesia complications and persistent nausea and vomiting

## 2014-05-09 ENCOUNTER — Inpatient Hospital Stay (HOSPITAL_COMMUNITY): Payer: Medicare Other

## 2014-05-09 DIAGNOSIS — D72829 Elevated white blood cell count, unspecified: Secondary | ICD-10-CM

## 2014-05-09 DIAGNOSIS — E871 Hypo-osmolality and hyponatremia: Secondary | ICD-10-CM

## 2014-05-09 LAB — HEMOGLOBIN AND HEMATOCRIT, BLOOD
HCT: 28.8 % — ABNORMAL LOW (ref 36.0–46.0)
HEMOGLOBIN: 9.7 g/dL — AB (ref 12.0–15.0)

## 2014-05-09 MED ORDER — ONDANSETRON HCL 4 MG/2ML IJ SOLN: 4.0000 mg | INTRAMUSCULAR | Status: DC | PRN

## 2014-05-09 MED ORDER — MAGNESIUM CITRATE PO SOLN
1.0000 | Freq: Once | ORAL | Status: AC
  Administered 2014-05-09: 1 via ORAL
  Filled 2014-05-09: qty 296

## 2014-05-09 MED ORDER — ONDANSETRON HCL 4 MG PO TABS
4.0000 mg | ORAL_TABLET | ORAL | Status: DC | PRN
  Administered 2014-05-09: 4 mg via ORAL
  Filled 2014-05-09: qty 1

## 2014-05-09 MED ORDER — ONDANSETRON HCL 4 MG/2ML IJ SOLN
4.0000 mg | Freq: Once | INTRAMUSCULAR | Status: AC
  Administered 2014-05-09: 4 mg via INTRAVENOUS
  Filled 2014-05-09: qty 2

## 2014-05-09 MED ORDER — CIPROFLOXACIN HCL 500 MG PO TABS
500.0000 mg | ORAL_TABLET | Freq: Two times a day (BID) | ORAL | Status: DC
  Filled 2014-05-09 (×2): qty 1

## 2014-05-09 NOTE — Progress Notes (Signed)
PT Cancellation Note  Patient Details Name: [name] MRN: [id number] DOB: 1926/02/29   Cancelled Treatment:    Reason Eval/Treat Not Completed: Medical issues which prohibited therapy. Patient politely declined OOB today due to nausea. RN stated that she was calling to get new orders for meds. Will follow up tomorrow   [name] 05/09/2014, 2:13 PM

## 2014-05-09 NOTE — Progress Notes (Signed)
[name]  MRN: [id number] DOB/Age: 08/29/1926 80 y.o. Physician: Ander Slade, M.D. 1 Day Post-Op Procedure(s) (LRB): REVERSE SHOULDER ARTHROPLASTY RIGHT  (Right)  Subjective: C/o nausea and no BM since Friday. Vomited x1 this am. Has received zofran and reglan Vital Signs Temp:  [97.6 F (36.4 C)-98.2 F (36.8 C)] 97.9 F (36.6 C) (06/10 0605) Pulse Rate:  [70-80] 75 (06/10 0605) Resp:  [10-20] 18 (06/10 0605) BP: (128-158)/(67-98) 150/72 mmHg (06/10 0605) SpO2:  [96 %-100 %] 99 % (06/10 0605)  Lab Results  Recent Labs  05/07/14 0453 05/09/14 0500  WBC 11.2*  --   HGB 10.1* 9.7*  HCT 30.1* 28.8*  PLT 275  --    BMET  Recent Labs  05/06/14 1252 05/07/14 0453  NA 131* 133*  K 4.0 3.7  CL 95* 96  CO2 25 26  GLUCOSE 92 102*  BUN 15 10  CREATININE 0.44* 0.45*  CALCIUM 8.8 9.0   INR  Date Value Ref Range Status  06/28/2012 1.00  0.00 - 1.49 Final     Exam  Right shoulder dressing dry, intact to light touch sensation about shoulder and distally, distal N/V intact  Plan Receiving bowel regimen, will d/c dilaudid and switch to po's, mobilize with PT, SW consult for d/c planning Phinneas Shakoor M 05/09/2014, 11:46 AM

## 2014-05-09 NOTE — Op Note (Signed)
[name], [name] NO.:  [id number]  MEDICAL RECORD NO.:  [id number]  LOCATION:  [id number]                        FACILITY:  [facility]  PHYSICIAN:  Metta Clines. Jatavia Keltner, M.D.  DATE OF BIRTH:  22-Jan-1926  DATE OF PROCEDURE:  05/08/2014 DATE OF DISCHARGE:                              OPERATIVE REPORT   PREOPERATIVE DIAGNOSIS:  Comminuted displaced right 4-part proximal humerus fracture.  POSTOPERATIVE DIAGNOSIS:  Comminuted displaced right 4-part proximal humerus fracture with additional findings suggestive of advanced arthrosis of the glenohumeral joint.  PROCEDURE:  Right shoulder reverse arthroplasty utilizing a cemented size 10 DePuy stem with a +9 metal spacer, +9 polyethylene insert, and a 38 eccentric glenosphere.  SURGEON:  Metta Clines. Algie Cales, M.D.  Terrence DupontOlivia Mackie A. Shuford, PA-C.  ANESTHESIA:  General endotracheal as well as an interscalene block.  ESTIMATED BLOOD LOSS:  200 mL.  DRAINS:  None.  HISTORY:  Ms. Savarino is an 80 year old female who lives independently at home, provided care by her son who unfortunately fell several days ago sustaining a severely displaced and comminuted 4-part right proximal humerus fracture.  She also shares with me preoperatively that she has been followed by Dr. Tobie Lords over the years, has had a number of injections about the right shoulder for suspected end-stage arthrosis versus perhaps rotator cuff tear arthropathy.  On physical examination, she has diffuse swelling about the right shoulder, but the skin is intact.  She did have laceration of the elbow, which has been cleaned and closed in the emergency room, appears grossly and neurovascularly intact in the right upper extremity.  Plain radiographs showed that the humeral head has been completely dislodged from the metaphysis of the humeral shaft with marked comminution and displacement.  Due to the degree of comminution and displacement, she is brought to  the operating room at this time for planned right shoulder reverse arthroplasty as described below.  I have clearly counseled Ms. Babineau as well as her son, Saba Gomm who is the power-of-attorney regarding treatment, options, and risks versus benefits thereof.  Possible surgical complications were reviewed including potential for bleeding, infection, neurovascular injury, failure of the implant, anesthetic complications, possible need for additional surgery.  They understands and accept and agree with the planned procedure.  PROCEDURE IN DETAIL:  After undergoing routine preop evaluation, the patient received prophylactic antibiotics.  An interscalene block was established in the holding area by the Anesthesia Department.  Placed supine on the operating table, underwent smooth induction of a general endotracheal anesthesia.  Placed into beach-chair position and appropriately padded and protected.  The right shoulder girdle region was then sterilely prepped and draped in standard fashion.  Time-out was called.  An anterior approach to the right shoulder was made through a 10-cm deltopectoral incision beginning at the coracoid and extending laterally and distally.  Skin flaps were elevated.  Dissection was carried deeply and electrocautery was used for hemostasis.  The cephalic vein was then identified.  The deltopectoral interval was then developed from proximal to distal with the vein retracted lateral to the deltoid. The upper cm of the pectoralis major was tenotomized to improve visualization and adhesions to the deltoid were divided  to place a Building control surveyor.  Immediately evident was significant swelling emanating from the subacromial/subdeltoid bursal region.  This is with chronic bursitis or perhaps rotator cuff tear arthropathy.  This capsule of tissue surrounding the bursa was incised and a large amount of viscous serosanguineous fluid was evacuated and significant amount of  blood clot ultimately was removed as well.  The pseudocapsule of the bursa was excised sharply.  This allowed [place] to visualize the fracture site and severe displacement of the articular segment and there was some evidence to suggest some degenerative changes involving the rotator cuff, but did not see an obvious rotator cuff tear arthropathy, but certainly the articular surface of the humeral head and glenoid suggested advanced osteoarthritis.  We were able to mobilize the tuberosities and I placed tag suture of #2 FiberWires at the bone-tendon junction of the lesser tuberosities and then used a rongeur to excise the extra bone and then used a towel clip to remove the bulk of the articular segment of the humeral head, which was markedly displaced and collapsed.  Multiple bone fragments were then excised.  Once we had the tuberosities tagged and mobilized, we then placed retractors about the glenoid and gained circumferential glenoid exposure removing the proximal stump of the biceps, which I should mention was intact and was tenotomized for later tenodesis and then performed a circumferential labral excision and gained visualization of periphery of the glenoid.  A guide pin was then placed in the center of the Metaglene, which was reamed both centrally and the hand peripheral reamer was used to remove peripheral osteophytes.  The central drill hole was then placed and our glenoid base plate was then impacted into position.  I should mention that the glenoid bone was markedly sclerotic.  The base plate was then successfully seated to the appropriate depth and then peripheral locking screws were placed inferiorly and superiorly, nonlocking screws anteriorly and posteriorly, all with excellent fixation.  The locking screws were then terminally tightened.  A 38 eccentric glenosphere was then placed over the glenoid base plate and terminally tightened much to our satisfaction.  At this point,  we returned our attention to the humeral shaft where we hand reamed up to size 10.  We performed a trial reduction with a size 10 implant showing appropriate soft tissue balance.  A distal cement plug was then placed in appropriate level, the canal was cleaned, irrigated, dried.  Cement was mixed and introduced into the canal in retrograde fashion and our size 10 stem was then introduced at 0 degrees retroversion alignment.  The cement was allowed to harden, all extra cement was removed.  We then performed trial reductions and ultimately our construct required a +9 metal insert, a +9 poly, and this was ultimately seated and a good soft tissue balance and good stability was achieved.  Once the final implants were reduced, I then repaired the tuberosities back to the metaphysis and the implant using the previously placed #2 FiberWires.  We had good reapposition. Irrigation was then completed.  Hemostasis was obtained.  The deltopectoral interval was then reapproximated with a series of figure- of-eight #1 Vicryl sutures.  2-0 Vicryl was used for the subcu layer and intracuticular 3-0 Monocryl for the skin followed by Dermabond and dry dressing.  The right arm was then placed in a sling and the patient was awakened, extubated, and taken to the recovery room in stable condition.  Jenetta Loges, PA-C was used as an Environmental consultant throughout this case and  essential for help with positioning of the patient, positioning of the extremity, management of the retractors, tissue manipulation, wound closure, and intraoperative decision making.    Metta Clines. Janette Harvie, M.D.    KMS/MEDQ  D:  05/08/2014  T:  05/09/2014  Job:  [id number]

## 2014-05-09 NOTE — Progress Notes (Signed)
PROGRESS NOTE  [name] HGD:[id number] DOB: Jan 16, 1926 DOA: 05/05/2014 PCP: Horatio Pel, MD  Assessment/Plan: Mechanical Fall with dizziness  -likely dehydration secondary to Lasix started to recover for lower extremity swelling. -Lasix has been discontinued. Continue avapro.  -Serial cardiac enzymes negative.  -2-D echo shows normal EF with grade 1 diastolic dysfunction.  -PT eval. Needs SNF.  Hyponatremia -chronic  -some improvement with IVF N/V -Likely combination of the patient's large hiatus hernia in addition to opioids -Symptomatic treatment -Small meals -Continue reglan prn and PPI -Abdominal x-ray Right humerus fracture  Reverse arthroplasty  -appreciate ortho Hypertension  -Continue Avapro  -given patient's age, aim for SBP in 150s leucocytosis  -Discontinue Cipro -No significant pyuria on UA -Likely due to acute medical condition -Repeat CBC in the morning    Family Communication:   Pt at beside Disposition Plan:   Family when medically stable       Procedures/Studies: Dg Chest 2 View  05/05/2014   CLINICAL DATA:  Right shoulder pain status post fall history of previous tobacco use  EXAM: CHEST  2 VIEW  COMPARISON:  Portable chest x-ray of June 11, 2012 and June 09, 2012  FINDINGS: The right lung is adequately inflated and clear. There is chronically increased density at the left lung base likely reflecting a large hiatal hernia -partially intrathoracic stomach. The pulmonary vascularity is not engorged. There is stable significant dextroscoliosis of the mid thoracic spine. There are degenerative changes of the left shoulder. There is an acute impacted, comminuted, and displaced fracture of the humeral head on the right.  IMPRESSION: 1. There is no active cardiopulmonary disease. 2. There is an acute fracture of the humeral head and neck on the right. 3. A large hiatal hernia-partially intrathoracic stomach is suspected and appears  stable.   Electronically Signed   By: Azavier Creson  [place]   On: 05/05/2014 17:04   Dg Shoulder Right  05/05/2014   CLINICAL DATA:  Fall.  Right shoulder injury and pain.  EXAM: RIGHT SHOULDER - 2+ VIEW  COMPARISON:  None.  FINDINGS: A highly comminuted and displaced fracture is seen involving the humeral head and neck. No evidence of dislocation. Generalized osteopenia noted. Degenerative changes also seen involving the glenohumeral and acromioclavicular joints.  IMPRESSION: Comminuted and displaced fracture involving the humeral head and neck.   Electronically Signed   By: Earle Gell M.D.   On: 05/05/2014 17:00   Dg Elbow 2 Views Right  05/05/2014   CLINICAL DATA:  Fall.  Right elbow injury and pain.  EXAM: RIGHT ELBOW - 2 VIEW  COMPARISON:  None.  FINDINGS: There is no evidence of acute fracture, dislocation, or joint effusion. Osteoarthritis seen mainly involving the lateral joint space. Soft tissues are unremarkable.  IMPRESSION: No acute findings.  Degenerative joint disease.   Electronically Signed   By: Earle Gell M.D.   On: 05/05/2014 17:02   Ct Head Wo Contrast  05/05/2014   CLINICAL DATA:  Status post fall with dizziness and right shoulder injury  EXAM: CT HEAD WITHOUT CONTRAST  CT CERVICAL SPINE WITHOUT CONTRAST  TECHNIQUE: Multidetector CT imaging of the head and cervical spine was performed following the standard protocol without intravenous contrast. Multiplanar CT image reconstructions of the cervical spine were also generated.  COMPARISON:  Noncontrast CT scan of brain dated June 09, 2012  FINDINGS: CT HEAD FINDINGS  The ventricles are normal in size and position. There is mild age  appropriate diffuse cerebral and cerebellar atrophy. There is decreased density in the deep white matter of both cerebral hemispheres consistent with chronic small vessel ischemic type change. An old lacunar infarction in the anterior limb of the right internal capsule is present. There is no acute intracranial  hemorrhage.  At bone window settings the observed portions of the paranasal sinuses and mastoid air cells are clear. There is no acute skull fracture.  CT CERVICAL SPINE FINDINGS  There is mild loss of the normal cervical lordosis. The vertebral bodies are preserved in height. There is disc space narrowing at C3-4 through C6-7. Large anterior and smaller posterior osteophytes are present. The prevertebral soft tissue spaces are normal. There is facet joint hypertrophy at multiple levels. The odontoid is intact. The bony ring at each cervical level is normal.  The pulmonary apices are clear.  IMPRESSION: 1. There is no evidence of an acute ischemic or hemorrhagic event. 2. There are stable age related small vessel ischemic changes. 3. There is no acute cervical spine fracture nor dislocation. Significant degenerative disc and facet joint changes are present at multiple levels.   Electronically Signed   By: Abygayle Deltoro  [place]   On: 05/05/2014 17:19   Ct Cervical Spine Wo Contrast  05/05/2014   CLINICAL DATA:  Status post fall with dizziness and right shoulder injury  EXAM: CT HEAD WITHOUT CONTRAST  CT CERVICAL SPINE WITHOUT CONTRAST  TECHNIQUE: Multidetector CT imaging of the head and cervical spine was performed following the standard protocol without intravenous contrast. Multiplanar CT image reconstructions of the cervical spine were also generated.  COMPARISON:  Noncontrast CT scan of brain dated June 09, 2012  FINDINGS: CT HEAD FINDINGS  The ventricles are normal in size and position. There is mild age appropriate diffuse cerebral and cerebellar atrophy. There is decreased density in the deep white matter of both cerebral hemispheres consistent with chronic small vessel ischemic type change. An old lacunar infarction in the anterior limb of the right internal capsule is present. There is no acute intracranial hemorrhage.  At bone window settings the observed portions of the paranasal sinuses and mastoid air cells  are clear. There is no acute skull fracture.  CT CERVICAL SPINE FINDINGS  There is mild loss of the normal cervical lordosis. The vertebral bodies are preserved in height. There is disc space narrowing at C3-4 through C6-7. Large anterior and smaller posterior osteophytes are present. The prevertebral soft tissue spaces are normal. There is facet joint hypertrophy at multiple levels. The odontoid is intact. The bony ring at each cervical level is normal.  The pulmonary apices are clear.  IMPRESSION: 1. There is no evidence of an acute ischemic or hemorrhagic event. 2. There are stable age related small vessel ischemic changes. 3. There is no acute cervical spine fracture nor dislocation. Significant degenerative disc and facet joint changes are present at multiple levels.   Electronically Signed   By: Evan Mackie  [place]   On: 05/05/2014 17:19   Ct Shoulder Right Wo Contrast  05/05/2014   CLINICAL DATA:  Known right shoulder fracture  EXAM: CT OF THE RIGHT SHOULDER WITHOUT CONTRAST  TECHNIQUE: Multidetector CT imaging was performed according to the standard protocol. Multiplanar CT image reconstructions were also generated.  COMPARISON:  Right shoulder two-view series of today's date  FINDINGS: There is a comminuted impacted fracture of the humeral head and neck. There is a small free bone fragment just inferior to the acromion. There are pre-existing degenerative changes of the articular  cortex of the humeral head. The bony glenoid exhibits degenerative change and a minimally distracted fracture of the anterior lip. There are degenerative changes of the [facility] joint. The acromion and visualized portions of the clavicle exhibit no acute abnormalities. There is degenerative change of the right sternoclavicular joint. The body of the scapula is intact where visualized.  There is a joint effusion.  There is a hematoma deep to the deltoid.  IMPRESSION: 1. There is a comminuted impacted fracture of the humeral head and neck. 2.  There is a minimally distracted fracture of the anterior lip of the glenoid. 3. There is a hematoma deep to the deltoid muscle.   Electronically Signed   By: Tameca Jerez  [place]   On: 05/05/2014 21:01   Dg Humerus Right  05/05/2014   CLINICAL DATA:  Pain post trauma  EXAM: RIGHT HUMERUS - 2+ VIEW  COMPARISON:  None.  FINDINGS: Frontal, Y scapular, and axillary views were obtained. There is a comminuted fracture of the proximal humeral metaphysis with marked displacement of the humeral head inferior to the humeral shaft with rotation and multiple fragments surrounding the proximal humeral shaft. There is no gross dislocation. Bones appear osteoporotic.  IMPRESSION: Extensively comminuted fracture of the proximal right humerus near the epiphysis -metaphysis junction with marked rotation and inferior displacement of the humeral head with respect to the remainder the bone.   Electronically Signed   By: Lowella Grip M.D.   On: 05/05/2014 17:02         Subjective: Patient had one episode of nausea and vomiting today. No hematemesis. Denies any chest pain, shortness breath, abdominal pain, dysuria, hematuria. No rashes. Denies fevers or chills.   Objective: Filed Vitals:   05/08/14 1619 05/08/14 2100 05/09/14 0605 05/09/14 1435  BP: 156/74 158/75 150/72 155/70  Pulse: 77 72 75 93  Temp: 97.8 F (36.6 C) 97.8 F (36.6 C) 97.9 F (36.6 C) 97.7 F (36.5 C)  TempSrc:      Resp: 14 16 18 18   Height:      Weight:      SpO2: 96% 99% 99% 100%    Intake/Output Summary (Last 24 hours) at 05/09/14 1447 Last data filed at 05/09/14 1300  Gross per 24 hour  Intake   1980 ml  Output   1500 ml  Net    480 ml   Weight change:  Exam:   General:  Pt is alert, follows commands appropriately, not in acute distress  HEENT: No icterus, No thrush,  [place]/AT  Cardiovascular: RRR, S1/S2, no rubs, no gallops  Respiratory: Bibasilar crackles, left greater than right. No wheezing. Good air  movement.  Abdomen: Soft/+BS, non tender, non distended, no guarding  Extremities: 1+LE edema, No lymphangitis, No petechiae, No rashes, no synovitis  Data Reviewed: Basic Metabolic Panel:  Recent Labs Lab 05/05/14 1610 05/06/14 1252 05/07/14 0453  NA 131* 131* 133*  K 4.1 4.0 3.7  CL 96 95* 96  CO2 21 25 26   GLUCOSE 142* 92 102*  BUN 23 15 10   CREATININE 0.61 0.44* 0.45*  CALCIUM 9.2 8.8 9.0   Liver Function Tests: No results found for this basename: AST, ALT, ALKPHOS, BILITOT, PROT, ALBUMIN,  in the last 168 hours No results found for this basename: LIPASE, AMYLASE,  in the last 168 hours No results found for this basename: AMMONIA,  in the last 168 hours CBC:  Recent Labs Lab 05/05/14 1610 05/07/14 0453 05/09/14 0500  WBC 15.0* 11.2*  --  NEUTROABS 13.3*  --   --   HGB 12.0 10.1* 9.7*  HCT 34.6* 30.1* 28.8*  MCV 91.5 92.6  --   PLT 369 275  --    Cardiac Enzymes:  Recent Labs Lab 05/06/14 0041 05/06/14 0712 05/06/14 1252  TROPONINI <0.30 <0.30 <0.30   BNP: No components found with this basename: POCBNP,  CBG: No results found for this basename: GLUCAP,  in the last 168 hours  Recent Results (from the past 240 hour(s))  URINE CULTURE     Status: None   Collection Time    05/06/14  9:41 AM      Result Value Ref Range Status   Specimen Description URINE, CLEAN CATCH   Final   Special Requests NONE   Final   Culture  Setup Time     Final   Value: 05/06/2014 21:03     Performed at [facility] Count     Final   Value: NO GROWTH     Performed at [facility]   Culture     Final   Value: NO GROWTH     Performed at [facility]   Report Status 05/07/2014 FINAL   Final  SURGICAL PCR SCREEN     Status: None   Collection Time    05/08/14  6:09 AM      Result Value Ref Range Status   MRSA, PCR NEGATIVE  NEGATIVE Final   Staphylococcus aureus NEGATIVE  NEGATIVE Final   Comment:            The Xpert SA Assay (FDA      approved for NASAL specimens     in patients over 11 years of age),     is one component of     a comprehensive surveillance     program.  Test performance has     been validated by [employer] for patients greater     than or equal to 39 year old.     It is not intended     to diagnose infection nor to     guide or monitor treatment.     Scheduled Meds: . calcium-vitamin D  2 tablet Oral BID  . cetirizine  10 mg Oral QHS   And  . cetirizine  5 mg Oral Daily  . enoxaparin (LOVENOX) injection  40 mg Subcutaneous Q24H  . irbesartan  75 mg Oral Daily  . magnesium citrate  1 Bottle Oral Once  . pantoprazole  40 mg Oral Daily  . senna-docusate  2 tablet Oral BID   Continuous Infusions: . sodium chloride 50 mL/hr at 05/07/14 0600  . lactated ringers 50 mL/hr at 05/08/14 1119  . lactated ringers 75 mL/hr at 05/08/14 1626     Durell Lofaso, DO  [facility] Pager [phone]  If 7PM-7AM, please contact night-coverage [email or web address] Password [email or web address] 05/09/2014, 2:47 PM   LOS: 4 days

## 2014-05-09 NOTE — Evaluation (Signed)
Occupational Therapy Evaluation Patient Details Name: CECILIA Cox MRN: [id number] DOB: 1926-10-21 Today's Date: 05/09/2014    History of Present Illness 80yo female adm 05/05/14 after fall resulting in R comminuted humerus fx --decision regarding surgery still pending at time of this eval; PMHx: OA,  back pain, HTN, Bil TKAs   Clinical Impression   [name] admitted with the above diagnoses and presents with below problem list. [name] will benefit from continued acute OT to address the below listed deficits and maximize independence with basic ADLs prior to d/c to next venue. [name] limited this session by dizziness, nausea, and pain in RUE and back. [name] sat EOB 2 minutes needing LUE to support sitting balance. Performed AROM of wrist and elbow only before [name] returned to supine. Currently at max A level for basic ADLs. Reviewed precautions, sling education, and safety recommendations with [name] and [name]. Educated on impact of precautions on ADL performance and techniques for assisting in completion of ADLs. OT to continue to follow and treat as appropriate.     Follow Up Recommendations  SNF    Equipment Recommendations  Other (comment) (defer needs to next venue)    Recommendations for Other Services [name] consult     Precautions / Restrictions Precautions Precautions: Fall;Shoulder Type of Shoulder Precautions: NWB, no AROM Shoulder Interventions: Shoulder sling/immobilizer;Off for dressing/bathing/exercises Precaution Comments: reviewed precautions with [name] and [name] Required Braces or Orthoses: Sling Restrictions Weight Bearing Restrictions: Yes RUE Weight Bearing: Non weight bearing      Mobility Bed Mobility Overal bed mobility: Needs Assistance Bed Mobility: Supine to Sit;Sit to Supine     Supine to sit: Max assist;HOB elevated Sit to supine: Max assist   General bed mobility comments: +2 physical A to scoot up in bed at end of session  Transfers                  General transfer comment: unable to stand due to increased nausea, pain and dizziness EOB    Balance Overall balance assessment: Needs assistance Sitting-balance support: Single extremity supported;Feet supported Sitting balance-[name]: Poor Sitting balance - Comments: unable to maintain sitting balance without LUE support       Standing balance comment: unable to assess                            ADL Overall ADL's : Needs assistance/impaired Eating/Feeding: Set up;Bed level   Grooming: Minimal assistance;Bed level   Upper Body Bathing: Bed level;Maximal assistance   Lower Body Bathing: Maximal assistance;Bed level   Upper Body Dressing : Maximal assistance;Sitting   Lower Body Dressing: Maximal assistance;Bed level     Toilet Transfer Details (indicate cue type and reason): catheter           General ADL Comments: [name] limited this session by dizziness, nausea, and pain in RUE and back. [name] sat EOB 2 minutes needing LUE to support sitting balance. Max A for basic ADLs. Reviewed precautions and safety recommendations with [name] and [name]. Educated on impact of precautions on ADL performance and techniques for assisting in completion of ADLs.      Vision                     Perception     Praxis      Pertinent Vitals/Pain 5/10. Ice applied. Notified nursing of [name] request for pain med.     Hand Dominance Right   Extremity/Trunk Assessment Upper  Extremity Assessment Upper Extremity Assessment: RUE deficits/detail RUE Deficits / Details: s/p reverse shoulder arthoplasty RUE: Unable to fully assess due to immobilization   Lower Extremity Assessment Lower Extremity Assessment: Defer to [name] evaluation   Cervical / Trunk Assessment Cervical / Trunk Assessment: Kyphotic   Communication Communication Communication: No difficulties   Cognition Arousal/Alertness: Awake/alert Behavior During Therapy: WFL for tasks assessed/performed Overall  Cognitive Status: Within Functional Limits for tasks assessed                     General Comments       Exercises Exercises: Shoulder     Shoulder Instructions Shoulder Instructions Donning/doffing shirt without moving shoulder: Maximal assistance Method for sponge bathing under operated UE: Maximal assistance Donning/doffing sling/immobilizer: Maximal assistance Correct positioning of sling/immobilizer: Maximal assistance Pendulum exercises (written home exercise program):  (n/a) ROM for elbow, wrist and digits of operated UE: Moderate assistance Sling wearing schedule (on at all times/off for ADL's): Maximal assistance Proper positioning of operated UE when showering: Moderate assistance Positioning of UE while sleeping: Maximal assistance    Home Living Family/patient expects to be discharged to:: [place]: Children;Other (Comment) (lives with son)   Type of Home: House   Entrance Stairs-Number of Steps: 3   Home Layout: Multi-level;Able to live on main level with bedroom/bathroom               Home Equipment: Gilford Rile - 2 wheels;Bedside commode   Additional Comments: used RW PTA      Prior Functioning/Environment Level of Independence: Independent with assistive device(s)        Comments: Independent with basic ADLs    OT Diagnosis: Acute pain   OT Problem List: Decreased strength;Decreased range of motion;Impaired balance (sitting and/or standing);Decreased knowledge of use of DME or AE;Decreased knowledge of precautions;Impaired UE functional use;Pain   OT Treatment/Interventions: Self-care/ADL training;Therapeutic exercise;DME and/or AE instruction;Patient/family education;Balance training;Therapeutic activities    OT Goals(Current goals can be found in the care plan section) Acute Rehab OT Goals Patient Stated Goal: not stated OT Goal Formulation: With patient/family Time For Goal Achievement:  05/16/14 Potential to Achieve Goals: Good ADL Goals [name] Will Perform Upper Body Bathing: sitting;with min guard assist [name] Will Perform Upper Body Dressing: sitting;with min guard assist [name] Will Transfer to Toilet: ambulating;with min assist [name] Will Perform Tub/Shower Transfer: with min assist;ambulating;3 in 1 [name]/caregiver will Perform Home Exercise Program: With minimal assist;Right Upper extremity Additional ADL Goal #1: [name] will perform supine<>EOB at minguard level to prepare for OOB ADLs.  OT Frequency: Min 3X/week   Barriers to D/C: Decreased caregiver support          Co-evaluation              End of Session Equipment Utilized During Treatment: Other (comment) (sling) Nurse Communication: Mobility status;Patient requests pain meds  Activity Tolerance: Patient limited by pain;Other (comment) (dizziness/nausea) Patient left: in bed;with call bell/phone within reach;with family/visitor present   Time: 0263-7858 OT Time Calculation (min): 31 min Charges:  OT General Charges $OT Visit: 1 Procedure OT Evaluation $Initial OT Evaluation Tier I: 1 Procedure OT Treatments $Self Care/Home Management : 8-22 mins G-Codes:    Hortencia Pilar June 02, 2014, 10:03 AM

## 2014-05-09 NOTE — Anesthesia Postprocedure Evaluation (Signed)
  Anesthesia Post-op Note  Patient: [name]  Procedure(s) Performed: Procedure(s): REVERSE SHOULDER ARTHROPLASTY RIGHT  (Right)  Patient Location: PACU  Anesthesia Type:General and GA combined with regional for post-op pain  Level of Consciousness: awake, alert  and oriented  Airway and Oxygen Therapy: Patient Spontanous Breathing and Patient connected to nasal cannula oxygen  Post-op Pain: none  Post-op Assessment: Post-op Vital signs reviewed, Patient's Cardiovascular Status Stable, Respiratory Function Stable, Patent Airway and Pain level controlled  Post-op Vital Signs: stable  Last Vitals:  Filed Vitals:   05/09/14 0605  BP: 150/72  Pulse: 75  Temp: 36.6 C  Resp: 18    Complications: No apparent anesthesia complications

## 2014-05-10 ENCOUNTER — Encounter (HOSPITAL_COMMUNITY): Payer: Self-pay | Admitting: Orthopedic Surgery

## 2014-05-10 DIAGNOSIS — E782 Mixed hyperlipidemia: Secondary | ICD-10-CM | POA: Diagnosis not present

## 2014-05-10 DIAGNOSIS — S42309D Unspecified fracture of shaft of humerus, unspecified arm, subsequent encounter for fracture with routine healing: Secondary | ICD-10-CM | POA: Diagnosis not present

## 2014-05-10 DIAGNOSIS — H2510 Age-related nuclear cataract, unspecified eye: Secondary | ICD-10-CM | POA: Diagnosis not present

## 2014-05-10 DIAGNOSIS — Z471 Aftercare following joint replacement surgery: Secondary | ICD-10-CM | POA: Diagnosis not present

## 2014-05-10 DIAGNOSIS — D649 Anemia, unspecified: Secondary | ICD-10-CM | POA: Diagnosis not present

## 2014-05-10 DIAGNOSIS — M25519 Pain in unspecified shoulder: Secondary | ICD-10-CM | POA: Diagnosis not present

## 2014-05-10 DIAGNOSIS — Z961 Presence of intraocular lens: Secondary | ICD-10-CM | POA: Diagnosis not present

## 2014-05-10 DIAGNOSIS — E785 Hyperlipidemia, unspecified: Secondary | ICD-10-CM | POA: Diagnosis not present

## 2014-05-10 DIAGNOSIS — E871 Hypo-osmolality and hyponatremia: Secondary | ICD-10-CM | POA: Diagnosis not present

## 2014-05-10 DIAGNOSIS — K5900 Constipation, unspecified: Secondary | ICD-10-CM | POA: Diagnosis not present

## 2014-05-10 DIAGNOSIS — Z96619 Presence of unspecified artificial shoulder joint: Secondary | ICD-10-CM | POA: Diagnosis not present

## 2014-05-10 DIAGNOSIS — S42293A Other displaced fracture of upper end of unspecified humerus, initial encounter for closed fracture: Secondary | ICD-10-CM | POA: Diagnosis not present

## 2014-05-10 DIAGNOSIS — R42 Dizziness and giddiness: Secondary | ICD-10-CM | POA: Diagnosis not present

## 2014-05-10 DIAGNOSIS — I10 Essential (primary) hypertension: Secondary | ICD-10-CM | POA: Diagnosis not present

## 2014-05-10 DIAGNOSIS — Z9181 History of falling: Secondary | ICD-10-CM | POA: Diagnosis not present

## 2014-05-10 DIAGNOSIS — D72829 Elevated white blood cell count, unspecified: Secondary | ICD-10-CM | POA: Diagnosis not present

## 2014-05-10 DIAGNOSIS — S72009D Fracture of unspecified part of neck of unspecified femur, subsequent encounter for closed fracture with routine healing: Secondary | ICD-10-CM | POA: Diagnosis not present

## 2014-05-10 DIAGNOSIS — S42309A Unspecified fracture of shaft of humerus, unspecified arm, initial encounter for closed fracture: Secondary | ICD-10-CM | POA: Diagnosis not present

## 2014-05-10 DIAGNOSIS — M549 Dorsalgia, unspecified: Secondary | ICD-10-CM | POA: Diagnosis not present

## 2014-05-10 DIAGNOSIS — N952 Postmenopausal atrophic vaginitis: Secondary | ICD-10-CM | POA: Diagnosis not present

## 2014-05-10 DIAGNOSIS — H31019 Macula scars of posterior pole (postinflammatory) (post-traumatic), unspecified eye: Secondary | ICD-10-CM | POA: Diagnosis not present

## 2014-05-10 DIAGNOSIS — M79609 Pain in unspecified limb: Secondary | ICD-10-CM | POA: Diagnosis not present

## 2014-05-10 DIAGNOSIS — G479 Sleep disorder, unspecified: Secondary | ICD-10-CM | POA: Diagnosis not present

## 2014-05-10 DIAGNOSIS — Z5189 Encounter for other specified aftercare: Secondary | ICD-10-CM | POA: Diagnosis not present

## 2014-05-10 DIAGNOSIS — H353290 Exudative age-related macular degeneration, unspecified eye, stage unspecified: Secondary | ICD-10-CM | POA: Diagnosis not present

## 2014-05-10 DIAGNOSIS — T148XXA Other injury of unspecified body region, initial encounter: Secondary | ICD-10-CM | POA: Diagnosis not present

## 2014-05-10 LAB — COMPREHENSIVE METABOLIC PANEL
ALBUMIN: 2.3 g/dL — AB (ref 3.5–5.2)
ALT: 11 U/L (ref 0–35)
AST: 18 U/L (ref 0–37)
Alkaline Phosphatase: 54 U/L (ref 39–117)
BUN: 9 mg/dL (ref 6–23)
CALCIUM: 8.2 mg/dL — AB (ref 8.4–10.5)
CO2: 28 mEq/L (ref 19–32)
CREATININE: 0.4 mg/dL — AB (ref 0.50–1.10)
Chloride: 90 mEq/L — ABNORMAL LOW (ref 96–112)
GFR calc non Af Amer: >90 mL/min (ref >90)
GLUCOSE: 105 mg/dL — AB (ref 70–99)
Potassium: 3.6 mEq/L — ABNORMAL LOW (ref 3.7–5.3)
Sodium: 128 mEq/L — ABNORMAL LOW (ref 137–147)
TOTAL PROTEIN: 5.2 g/dL — AB (ref 6.0–8.3)
Total Bilirubin: 0.5 mg/dL (ref 0.3–1.2)

## 2014-05-10 LAB — CBC
HEMATOCRIT: 23.7 % — AB (ref 36.0–46.0)
Hemoglobin: 8.2 g/dL — ABNORMAL LOW (ref 12.0–15.0)
MCH: 31.5 pg (ref 26.0–34.0)
MCHC: 34.6 g/dL (ref 30.0–36.0)
MCV: 91.2 fL (ref 78.0–100.0)
Platelets: 265 10*3/uL (ref 150–400)
RBC: 2.6 MIL/uL — AB (ref 3.87–5.11)
RDW: 12.3 % (ref 11.5–15.5)
WBC: 8.7 10*3/uL (ref 4.0–10.5)

## 2014-05-10 MED ORDER — IRBESARTAN 75 MG PO TABS: 75.0000 mg | ORAL_TABLET | Freq: Every day | ORAL | Status: DC

## 2014-05-10 MED ORDER — BISACODYL 5 MG PO TBEC: 5.0000 mg | DELAYED_RELEASE_TABLET | Freq: Every day | ORAL | Status: DC | PRN

## 2014-05-10 MED ORDER — SENNOSIDES-DOCUSATE SODIUM 8.6-50 MG PO TABS: 2.0000 | ORAL_TABLET | Freq: Two times a day (BID) | ORAL | Status: DC

## 2014-05-10 MED ORDER — POLYETHYLENE GLYCOL 3350 17 G PO PACK
17.0000 g | PACK | Freq: Every day | ORAL | Status: DC | PRN
Start: 2014-05-10 — End: 2016-05-20

## 2014-05-10 MED ORDER — OXYCODONE-ACETAMINOPHEN 5-325 MG PO TABS: 1.0000 | ORAL_TABLET | ORAL | Status: DC | PRN

## 2014-05-10 NOTE — Discharge Summary (Signed)
Physician Discharge Summary  [name] FBP:[id number] DOB: 04/08/1926 DOA: 05/05/2014  PCP: Horatio Pel, MD  Admit date: 05/05/2014 Discharge date: 05/10/2014  Recommendations for Outpatient Follow-up:  1. Pt will need to follow up with PCP in 2 weeks post discharge 2. Please obtain BMP  3. Please also check CBC    Discharge Diagnoses:  Mechanical Fall with dizziness  -likely dehydration secondary to Lasix started  for lower extremity swelling. -Lasix has been discontinued. Continue avapro.  -Serial cardiac enzymes negative.  -2-D echo shows normal EF with grade 1 diastolic dysfunction.  -PT eval. Needs SNF.  -Dizziness improved on discharge -Furosemide was discontinued and the patient's lower extremity edema actually improved during the hospitalization Hyponatremia  -chronic--128-133 -some improvement with IVF  N/V  -Likely combination of the patient's large hiatus hernia in addition to opioids  -Symptomatic treatment  -Small meals  -Continue reglan prn and PPI  -Abdominal x-ray--suggested constipation -The patient was given magnesium citrate which alleviated her abdominal discomfort after bowel movement -The patient's diet was advanced and she tolerated it  Right humerus fracture  Reverse arthroplasty  -appreciate ortho  Hypertension  -Continue Avapro  -given patient's age, aim for SBP in 150s  leucocytosis  -Discontinue Cipro  -No significant pyuria on UA  -Likely due to acute medical condition  -Repeat CBC in the morning--WBC 8.7 on the day of discharge  Discharge Condition: Stable  Disposition: Discharge to [facility] Follow-up Information   Follow up with Marin Shutter, MD. Schedule an appointment as soon as possible for a visit in 2 weeks. (For wound re-check)    Specialty:  Orthopedic Surgery   Contact information:   [address] [place] [address] [phone]       Diet: Heart healthy Wt Readings from Last 3  Encounters:  05/05/14 69.718 kg (153 lb 11.2 oz)  05/05/14 69.718 kg (153 lb 11.2 oz)  01/24/13 73.029 kg (161 lb)    History of present illness:  80 y.o. female with h/o  hyperlipidemia, hypertension, comes in for persistent dizziness since three weeks and a fall today. No syncope. As per the patient, she reports the above symptoms and fell today. She denies loss of consciousness. She reports pain in the right arm On arrival to ED, she was found to have leukocytosis,. Her x rays of the right arm revealed comminuted fracture of the humeral head and neck. Her right arm was put in the sling and she was referred to medical service for admission for dizziness. She also reported that she had pedal edema since many months and her PCP started her on lasix three weeks ago and since then she has been feeling dizziness. She currently denies any other complaints other than pain in the right arm. Orthopedics was consulted by EDP.  Dr. Onnie Graham saw pt and performed  Reverse arthroplasty on 05/08/2014. The patient did not have any operative complications. The patient participated with physical therapy. They recommended skilled nursing facility. The patient's dizziness improved after furosemide was discontinued.   her lower extremity edema actually improved during admission. Echocardiogram showed normal ejection fraction with grade 1 diastolic dysfunction.    Consultants: Ortho--Dr. Onnie Graham  Discharge Exam: Filed Vitals:   05/10/14 0617  BP: 150/76  Pulse: 82  Temp: 97.9 F (36.6 C)  Resp: 18   Filed Vitals:   05/09/14 0605 05/09/14 1435 05/09/14 2018 05/10/14 0617  BP: 150/72 155/70 152/74 150/76  Pulse: 75 93 84 82  Temp: 97.9 F (36.6 C) 97.7  F (36.5 C) 97.8 F (36.6 C) 97.9 F (36.6 C)  TempSrc:      Resp: 18 18 18 18   Height:      Weight:      SpO2: 99% 100% 99% 99%   General: A&O x 3, NAD, pleasant, cooperative Cardiovascular: RRR, no rub, no gallop, no S3 Respiratory: bibasilar  crackles. No wheezing good air movement  Abdomen:soft, nontender, nondistended, positive bowel sounds Extremities: 1+LE edema, No lymphangitis, no petechiae  Discharge Instructions      Discharge Instructions   Diet - low sodium heart healthy    Complete by:  As directed      Increase activity slowly    Complete by:  As directed             Medication List    STOP taking these medications       furosemide 20 MG tablet  Commonly known as:  LASIX      TAKE these medications       bisacodyl 5 MG EC tablet  Commonly known as:  DULCOLAX  Take 1 tablet (5 mg total) by mouth daily as needed for mild constipation or moderate constipation.     calcium citrate-vitamin D 315-200 MG-UNIT per tablet  Commonly known as:  CITRACAL+D  Take 2 tablets by mouth 2 (two) times daily.     cetirizine 10 MG tablet  Commonly known as:  ZYRTEC  Take 10 mg by mouth daily.     cetirizine 10 MG tablet  Commonly known as:  ZYRTEC  Take 5-10 mg by mouth 2 (two) times daily. Take 1/2 tablet ( 5mg ) every morning and 1 tablet (10mg ) at bedtime.     diclofenac sodium 1 % Gel  Commonly known as:  VOLTAREN  Apply topically 2 (two) times daily.     diphenhydrAMINE 25 mg capsule  Commonly known as:  BENADRYL  Take 1 capsule (25 mg total) by mouth every 6 (six) hours as needed for itching, allergies or sleep.     ferrous sulfate 325 (65 FE) MG tablet  Take 1 tablet (325 mg total) by mouth 3 (three) times daily with meals.     irbesartan 75 MG tablet  Commonly known as:  AVAPRO  Take 1 tablet (75 mg total) by mouth daily.     LUCENTIS IO  Inject into the eye every 8 (eight) weeks. Injection in right eye     magnesium hydroxide 311 MG Chew chewable tablet  Commonly known as:  PHILLIPS CHEWS  Chew 311 mg by mouth daily as needed (constipation.).     multivitamin capsule  Take 1 capsule by mouth daily.     omeprazole 20 MG capsule  Commonly known as:  PRILOSEC  Take 20 mg by mouth daily.      oxyCODONE-acetaminophen 5-325 MG per tablet  Commonly known as:  PERCOCET/ROXICET  Take 1 tablet by mouth every 4 (four) hours as needed for severe pain.     PHILLIPS COLON HEALTH PO  Take 1 tablet by mouth daily.     polyethylene glycol packet  Commonly known as:  MIRALAX / GLYCOLAX  Take 17 g by mouth daily as needed for mild constipation.     PRESCRIPTION MEDICATION  Apply topically daily as needed (vaginal yeast infection.).     PRESCRIPTION MEDICATION  Place 1 application around the anus daily as needed (itching).     PRESERVISION AREDS 2 PO  Take 1 tablet by mouth 2 (two) times daily.  promethazine 12.5 MG tablet  Commonly known as:  PHENERGAN  Take 1-2 tablets (12.5-25 mg total) by mouth every 6 (six) hours as needed for nausea.     RECLAST 5 MG/100ML Soln injection  Generic drug:  zoledronic acid  Inject 5 mg into the vein. Once annually.     senna-docusate 8.6-50 MG per tablet  Commonly known as:  Senokot-S  Take 2 tablets by mouth 2 (two) times daily.     trimethoprim 100 MG tablet  Commonly known as:  TRIMPEX  Take 100 mg by mouth daily. UTI prophylaxis.         The results of significant diagnostics from this hospitalization (including imaging, microbiology, ancillary and laboratory) are listed below for reference.    Significant Diagnostic Studies: Dg Chest 2 View  05/05/2014   CLINICAL DATA:  Right shoulder pain status post fall history of previous tobacco use  EXAM: CHEST  2 VIEW  COMPARISON:  Portable chest x-ray of June 11, 2012 and June 09, 2012  FINDINGS: The right lung is adequately inflated and clear. There is chronically increased density at the left lung base likely reflecting a large hiatal hernia -partially intrathoracic stomach. The pulmonary vascularity is not engorged. There is stable significant dextroscoliosis of the mid thoracic spine. There are degenerative changes of the left shoulder. There is an acute impacted, comminuted, and  displaced fracture of the humeral head on the right.  IMPRESSION: 1. There is no active cardiopulmonary disease. 2. There is an acute fracture of the humeral head and neck on the right. 3. A large hiatal hernia-partially intrathoracic stomach is suspected and appears stable.   Electronically Signed   By: Russel Morain  [place]   On: 05/05/2014 17:04   Dg Shoulder Right  05/05/2014   CLINICAL DATA:  Fall.  Right shoulder injury and pain.  EXAM: RIGHT SHOULDER - 2+ VIEW  COMPARISON:  None.  FINDINGS: A highly comminuted and displaced fracture is seen involving the humeral head and neck. No evidence of dislocation. Generalized osteopenia noted. Degenerative changes also seen involving the glenohumeral and acromioclavicular joints.  IMPRESSION: Comminuted and displaced fracture involving the humeral head and neck.   Electronically Signed   By: Earle Gell M.D.   On: 05/05/2014 17:00   Dg Elbow 2 Views Right  05/05/2014   CLINICAL DATA:  Fall.  Right elbow injury and pain.  EXAM: RIGHT ELBOW - 2 VIEW  COMPARISON:  None.  FINDINGS: There is no evidence of acute fracture, dislocation, or joint effusion. Osteoarthritis seen mainly involving the lateral joint space. Soft tissues are unremarkable.  IMPRESSION: No acute findings.  Degenerative joint disease.   Electronically Signed   By: Earle Gell M.D.   On: 05/05/2014 17:02   Ct Head Wo Contrast  05/05/2014   CLINICAL DATA:  Status post fall with dizziness and right shoulder injury  EXAM: CT HEAD WITHOUT CONTRAST  CT CERVICAL SPINE WITHOUT CONTRAST  TECHNIQUE: Multidetector CT imaging of the head and cervical spine was performed following the standard protocol without intravenous contrast. Multiplanar CT image reconstructions of the cervical spine were also generated.  COMPARISON:  Noncontrast CT scan of brain dated June 09, 2012  FINDINGS: CT HEAD FINDINGS  The ventricles are normal in size and position. There is mild age appropriate diffuse cerebral and cerebellar atrophy.  There is decreased density in the deep white matter of both cerebral hemispheres consistent with chronic small vessel ischemic type change. An old lacunar infarction in the anterior limb of  the right internal capsule is present. There is no acute intracranial hemorrhage.  At bone window settings the observed portions of the paranasal sinuses and mastoid air cells are clear. There is no acute skull fracture.  CT CERVICAL SPINE FINDINGS  There is mild loss of the normal cervical lordosis. The vertebral bodies are preserved in height. There is disc space narrowing at C3-4 through C6-7. Large anterior and smaller posterior osteophytes are present. The prevertebral soft tissue spaces are normal. There is facet joint hypertrophy at multiple levels. The odontoid is intact. The bony ring at each cervical level is normal.  The pulmonary apices are clear.  IMPRESSION: 1. There is no evidence of an acute ischemic or hemorrhagic event. 2. There are stable age related small vessel ischemic changes. 3. There is no acute cervical spine fracture nor dislocation. Significant degenerative disc and facet joint changes are present at multiple levels.   Electronically Signed   By: Nashiya Disbrow  [place]   On: 05/05/2014 17:19   Ct Cervical Spine Wo Contrast  05/05/2014   CLINICAL DATA:  Status post fall with dizziness and right shoulder injury  EXAM: CT HEAD WITHOUT CONTRAST  CT CERVICAL SPINE WITHOUT CONTRAST  TECHNIQUE: Multidetector CT imaging of the head and cervical spine was performed following the standard protocol without intravenous contrast. Multiplanar CT image reconstructions of the cervical spine were also generated.  COMPARISON:  Noncontrast CT scan of brain dated June 09, 2012  FINDINGS: CT HEAD FINDINGS  The ventricles are normal in size and position. There is mild age appropriate diffuse cerebral and cerebellar atrophy. There is decreased density in the deep white matter of both cerebral hemispheres consistent with chronic  small vessel ischemic type change. An old lacunar infarction in the anterior limb of the right internal capsule is present. There is no acute intracranial hemorrhage.  At bone window settings the observed portions of the paranasal sinuses and mastoid air cells are clear. There is no acute skull fracture.  CT CERVICAL SPINE FINDINGS  There is mild loss of the normal cervical lordosis. The vertebral bodies are preserved in height. There is disc space narrowing at C3-4 through C6-7. Large anterior and smaller posterior osteophytes are present. The prevertebral soft tissue spaces are normal. There is facet joint hypertrophy at multiple levels. The odontoid is intact. The bony ring at each cervical level is normal.  The pulmonary apices are clear.  IMPRESSION: 1. There is no evidence of an acute ischemic or hemorrhagic event. 2. There are stable age related small vessel ischemic changes. 3. There is no acute cervical spine fracture nor dislocation. Significant degenerative disc and facet joint changes are present at multiple levels.   Electronically Signed   By: Jahzara Slattery  [place]   On: 05/05/2014 17:19   Ct Shoulder Right Wo Contrast  05/05/2014   CLINICAL DATA:  Known right shoulder fracture  EXAM: CT OF THE RIGHT SHOULDER WITHOUT CONTRAST  TECHNIQUE: Multidetector CT imaging was performed according to the standard protocol. Multiplanar CT image reconstructions were also generated.  COMPARISON:  Right shoulder two-view series of today's date  FINDINGS: There is a comminuted impacted fracture of the humeral head and neck. There is a small free bone fragment just inferior to the acromion. There are pre-existing degenerative changes of the articular cortex of the humeral head. The bony glenoid exhibits degenerative change and a minimally distracted fracture of the anterior lip. There are degenerative changes of the [facility] joint. The acromion and visualized portions of the clavicle exhibit  no acute abnormalities. There is  degenerative change of the right sternoclavicular joint. The body of the scapula is intact where visualized.  There is a joint effusion.  There is a hematoma deep to the deltoid.  IMPRESSION: 1. There is a comminuted impacted fracture of the humeral head and neck. 2. There is a minimally distracted fracture of the anterior lip of the glenoid. 3. There is a hematoma deep to the deltoid muscle.   Electronically Signed   By: Ezra Marquess  [place]   On: 05/05/2014 21:01   Dg Abd 2 Views  05/09/2014   CLINICAL DATA:  Nausea and vomiting  EXAM: ABDOMEN - 2 VIEW  COMPARISON:  February 25, 2010  FINDINGS: Supine and upright images were obtained. There is moderate stool throughout the colon. The bowel gas pattern is unremarkable. No obstruction or free air. There is a sizable paraesophageal type hernia. There is a total hip replacement on the left. There are multiple clips in coils in the pelvis. There is advanced arthropathy in scoliosis in the lumbar spine.  IMPRESSION: Moderate stool throughout colon. Overall bowel gas pattern unremarkable.   Electronically Signed   By: Lowella Grip M.D.   On: 05/09/2014 17:30   Dg Humerus Right  05/05/2014   CLINICAL DATA:  Pain post trauma  EXAM: RIGHT HUMERUS - 2+ VIEW  COMPARISON:  None.  FINDINGS: Frontal, Y scapular, and axillary views were obtained. There is a comminuted fracture of the proximal humeral metaphysis with marked displacement of the humeral head inferior to the humeral shaft with rotation and multiple fragments surrounding the proximal humeral shaft. There is no gross dislocation. Bones appear osteoporotic.  IMPRESSION: Extensively comminuted fracture of the proximal right humerus near the epiphysis -metaphysis junction with marked rotation and inferior displacement of the humeral head with respect to the remainder the bone.   Electronically Signed   By: Lowella Grip M.D.   On: 05/05/2014 17:02     Microbiology: Recent Results (from the past 240 hour(s))    URINE CULTURE     Status: None   Collection Time    05/06/14  9:41 AM      Result Value Ref Range Status   Specimen Description URINE, CLEAN CATCH   Final   Special Requests NONE   Final   Culture  Setup Time     Final   Value: 05/06/2014 21:03     Performed at [facility] Count     Final   Value: NO GROWTH     Performed at [facility]   Culture     Final   Value: NO GROWTH     Performed at [facility]   Report Status 05/07/2014 FINAL   Final  SURGICAL PCR SCREEN     Status: None   Collection Time    05/08/14  6:09 AM      Result Value Ref Range Status   MRSA, PCR NEGATIVE  NEGATIVE Final   Staphylococcus aureus NEGATIVE  NEGATIVE Final   Comment:            The Xpert SA Assay (FDA     approved for NASAL specimens     in patients over 12 years of age),     is one component of     a comprehensive surveillance     program.  Test performance has     been validated by [employer] for patients greater  than or equal to 97 year old.     It is not intended     to diagnose infection nor to     guide or monitor treatment.     Labs: Basic Metabolic Panel:  Recent Labs Lab 05/05/14 1610 05/06/14 1252 05/07/14 0453 05/10/14 0604  NA 131* 131* 133* 128*  K 4.1 4.0 3.7 3.6*  CL 96 95* 96 90*  CO2 21 25 26 28   GLUCOSE 142* 92 102* 105*  BUN 23 15 10 9   CREATININE 0.61 0.44* 0.45* 0.40*  CALCIUM 9.2 8.8 9.0 8.2*   Liver Function Tests:  Recent Labs Lab 05/10/14 0604  AST 18  ALT 11  ALKPHOS 54  BILITOT 0.5  PROT 5.2*  ALBUMIN 2.3*   No results found for this basename: LIPASE, AMYLASE,  in the last 168 hours No results found for this basename: AMMONIA,  in the last 168 hours CBC:  Recent Labs Lab 05/05/14 1610 05/07/14 0453 05/09/14 0500 05/10/14 0604  WBC 15.0* 11.2*  --  8.7  NEUTROABS 13.3*  --   --   --   HGB 12.0 10.1* 9.7* 8.2*  HCT 34.6* 30.1* 28.8* 23.7*  MCV 91.5 92.6  --  91.2  PLT 369 275  --  265    Cardiac Enzymes:  Recent Labs Lab 05/06/14 0041 05/06/14 0712 05/06/14 1252  TROPONINI <0.30 <0.30 <0.30   BNP: No components found with this basename: POCBNP,  CBG: No results found for this basename: GLUCAP,  in the last 168 hours  Time coordinating discharge:  Greater than 30 minutes  Signed:  Westlynn Fifer, DO [facility] Pager: [phone] 05/10/2014, 9:44 AM

## 2014-05-10 NOTE — Progress Notes (Signed)
Physical Therapy Treatment Patient Details Name: [name] MRN: [id number] DOB: May 02, 1926 Today's Date: 05/10/2014    History of Present Illness 80yo female adm 05/05/14 after fall resulting in R comminuted humerus fx --decision regarding surgery still pending at time of this eval; PMHx: OA,  back pain, HTN, Bil TKAs    PT Comments    Patient agreeable to OOB to recliner. Patient very fearful of falling and requiring +2 assist for confidence. Patient planning to DC later today  Follow Up Recommendations  SNF;Supervision/Assistance - 24 hour     Equipment Recommendations  None recommended by PT    Recommendations for Other Services       Precautions / Restrictions Precautions Precautions: Fall;Shoulder Type of Shoulder Precautions: NWB, no AROM Shoulder Interventions: Shoulder sling/immobilizer;Off for dressing/bathing/exercises Required Braces or Orthoses: Sling Restrictions RUE Weight Bearing: Non weight bearing    Mobility  Bed Mobility Overal bed mobility: Needs Assistance Bed Mobility: Supine to Sit;Sit to Supine     Supine to sit: HOB elevated;Mod assist     General bed mobility comments: Mod A for LEs and for trunk support into upright sitting.   Transfers Overall transfer level: Needs assistance Equipment used: 2 person hand held assist Transfers: Sit to/from [facility] Sit to Stand: Mod assist;+2 physical assistance;+2 safety/equipment;From elevated surface Stand pivot transfers: Mod assist;+2 physical assistance       General transfer comment: A for power up into standing and for stabilization. Cues for technique and positioning with stand and sit. Patient fatiques quickly with SPT  Ambulation/Gait                 Stairs            Wheelchair Mobility    Modified Rankin (Stroke Patients Only)       Balance     Sitting balance-[name]: Fair       Standing balance-[name]: Poor                       Cognition Arousal/Alertness: Awake/alert Behavior During Therapy: WFL for tasks assessed/performed Overall Cognitive Status: Within Functional Limits for tasks assessed                      Exercises      General Comments        Pertinent Vitals/Pain no apparent distress     Home Living                      Prior Function            PT Goals (current goals can now be found in the care plan section) Progress towards PT goals: Progressing toward goals    Frequency  Min 3X/week    PT Plan Current plan remains appropriate    Co-evaluation             End of Session Equipment Utilized During Treatment: Gait belt Activity Tolerance: Patient tolerated treatment well;Patient limited by fatigue Patient left: with call bell/phone within reach;in chair     Time: 1020-1050 PT Time Calculation (min): 30 min  Charges:  $Therapeutic Activity: 23-37 mins                    G Codes:      [name] 05/10/2014, 12:42 PM 05/10/2014 [name] PTA [phone] pager [phone] office

## 2014-05-10 NOTE — Progress Notes (Signed)
Awaiting stability for d/c per MD. Patient will be placed at [facility] for short term SNF.  Fl2 on chart for MD's signature. Lorie Phenix. [place], [place]

## 2014-05-10 NOTE — Progress Notes (Signed)
[name]  MRN: [id number] DOB/Age: 02/10/1926 80 y.o. Physician: Ander Slade, M.D. 2 Days Post-Op Procedure(s) (LRB): REVERSE SHOULDER ARTHROPLASTY RIGHT  (Right)  Subjective: Feeling much better this am. "I had a good night". +BM. No nausea this am. Breakfast pending Vital Signs Temp:  [97.7 F (36.5 C)-97.9 F (36.6 C)] 97.9 F (36.6 C) (06/11 0617) Pulse Rate:  [82-93] 82 (06/11 0617) Resp:  [18] 18 (06/11 0617) BP: (150-155)/(70-76) 150/76 mmHg (06/11 0617) SpO2:  [99 %-100 %] 99 % (06/11 0617)  Lab Results  Recent Labs  05/09/14 0500 05/10/14 0604  WBC  --  8.7  HGB 9.7* 8.2*  HCT 28.8* 23.7*  PLT  --  265   BMET  Recent Labs  05/10/14 0604  NA 128*  K 3.6*  CL 90*  CO2 28  GLUCOSE 105*  BUN 9  CREATININE 0.40*  CALCIUM 8.2*   INR  Date Value Ref Range Status  06/28/2012 1.00  0.00 - 1.49 Final     Exam  RUE in sling, dressing ok, stable exam  Plan Doing well from ortho perspective. Will discuss with Dr. Carles Collet d/c plans.  Toni Hoffmeister M 05/10/2014, 7:41 AM

## 2014-05-12 DIAGNOSIS — G479 Sleep disorder, unspecified: Secondary | ICD-10-CM | POA: Diagnosis not present

## 2014-05-12 DIAGNOSIS — M79609 Pain in unspecified limb: Secondary | ICD-10-CM | POA: Diagnosis not present

## 2014-05-16 DIAGNOSIS — E871 Hypo-osmolality and hyponatremia: Secondary | ICD-10-CM | POA: Diagnosis not present

## 2014-05-21 DIAGNOSIS — Z96619 Presence of unspecified artificial shoulder joint: Secondary | ICD-10-CM | POA: Diagnosis not present

## 2014-05-31 DIAGNOSIS — Z961 Presence of intraocular lens: Secondary | ICD-10-CM | POA: Diagnosis not present

## 2014-05-31 DIAGNOSIS — H353290 Exudative age-related macular degeneration, unspecified eye, stage unspecified: Secondary | ICD-10-CM | POA: Diagnosis not present

## 2014-05-31 DIAGNOSIS — H2510 Age-related nuclear cataract, unspecified eye: Secondary | ICD-10-CM | POA: Diagnosis not present

## 2014-05-31 DIAGNOSIS — H31019 Macula scars of posterior pole (postinflammatory) (post-traumatic), unspecified eye: Secondary | ICD-10-CM | POA: Diagnosis not present

## 2014-05-31 DIAGNOSIS — G479 Sleep disorder, unspecified: Secondary | ICD-10-CM | POA: Diagnosis not present

## 2014-05-31 DIAGNOSIS — S42309A Unspecified fracture of shaft of humerus, unspecified arm, initial encounter for closed fracture: Secondary | ICD-10-CM | POA: Diagnosis not present

## 2014-05-31 DIAGNOSIS — I10 Essential (primary) hypertension: Secondary | ICD-10-CM | POA: Diagnosis not present

## 2014-05-31 DIAGNOSIS — D649 Anemia, unspecified: Secondary | ICD-10-CM | POA: Diagnosis not present

## 2014-06-18 DIAGNOSIS — Z96619 Presence of unspecified artificial shoulder joint: Secondary | ICD-10-CM | POA: Diagnosis not present

## 2014-06-18 DIAGNOSIS — Z471 Aftercare following joint replacement surgery: Secondary | ICD-10-CM | POA: Diagnosis not present

## 2014-06-21 DIAGNOSIS — K5900 Constipation, unspecified: Secondary | ICD-10-CM | POA: Diagnosis not present

## 2014-06-21 DIAGNOSIS — M549 Dorsalgia, unspecified: Secondary | ICD-10-CM | POA: Diagnosis not present

## 2014-06-21 DIAGNOSIS — N952 Postmenopausal atrophic vaginitis: Secondary | ICD-10-CM | POA: Diagnosis not present

## 2014-06-21 DIAGNOSIS — S42309A Unspecified fracture of shaft of humerus, unspecified arm, initial encounter for closed fracture: Secondary | ICD-10-CM | POA: Diagnosis not present

## 2014-07-16 DIAGNOSIS — Z471 Aftercare following joint replacement surgery: Secondary | ICD-10-CM | POA: Diagnosis not present

## 2014-07-16 DIAGNOSIS — Z96619 Presence of unspecified artificial shoulder joint: Secondary | ICD-10-CM | POA: Diagnosis not present

## 2014-07-22 DIAGNOSIS — E871 Hypo-osmolality and hyponatremia: Secondary | ICD-10-CM | POA: Diagnosis not present

## 2014-07-22 DIAGNOSIS — I10 Essential (primary) hypertension: Secondary | ICD-10-CM | POA: Diagnosis not present

## 2014-07-22 DIAGNOSIS — S72009D Fracture of unspecified part of neck of unspecified femur, subsequent encounter for closed fracture with routine healing: Secondary | ICD-10-CM | POA: Diagnosis not present

## 2014-07-22 DIAGNOSIS — N952 Postmenopausal atrophic vaginitis: Secondary | ICD-10-CM | POA: Diagnosis not present

## 2014-07-22 DIAGNOSIS — D649 Anemia, unspecified: Secondary | ICD-10-CM | POA: Diagnosis not present

## 2014-07-23 DIAGNOSIS — S42309A Unspecified fracture of shaft of humerus, unspecified arm, initial encounter for closed fracture: Secondary | ICD-10-CM | POA: Diagnosis not present

## 2014-07-23 DIAGNOSIS — I10 Essential (primary) hypertension: Secondary | ICD-10-CM | POA: Diagnosis not present

## 2014-07-26 DIAGNOSIS — H353290 Exudative age-related macular degeneration, unspecified eye, stage unspecified: Secondary | ICD-10-CM | POA: Diagnosis not present

## 2014-07-30 DIAGNOSIS — M25519 Pain in unspecified shoulder: Secondary | ICD-10-CM | POA: Diagnosis not present

## 2014-08-02 DIAGNOSIS — M5137 Other intervertebral disc degeneration, lumbosacral region: Secondary | ICD-10-CM | POA: Diagnosis not present

## 2014-08-07 DIAGNOSIS — M25519 Pain in unspecified shoulder: Secondary | ICD-10-CM | POA: Diagnosis not present

## 2014-08-09 DIAGNOSIS — M25519 Pain in unspecified shoulder: Secondary | ICD-10-CM | POA: Diagnosis not present

## 2014-08-13 DIAGNOSIS — M25519 Pain in unspecified shoulder: Secondary | ICD-10-CM | POA: Diagnosis not present

## 2014-08-15 DIAGNOSIS — M25519 Pain in unspecified shoulder: Secondary | ICD-10-CM | POA: Diagnosis not present

## 2014-08-20 DIAGNOSIS — M25519 Pain in unspecified shoulder: Secondary | ICD-10-CM | POA: Diagnosis not present

## 2014-08-22 DIAGNOSIS — M25519 Pain in unspecified shoulder: Secondary | ICD-10-CM | POA: Diagnosis not present

## 2014-08-22 DIAGNOSIS — Z23 Encounter for immunization: Secondary | ICD-10-CM | POA: Diagnosis not present

## 2014-08-30 DIAGNOSIS — M25511 Pain in right shoulder: Secondary | ICD-10-CM | POA: Diagnosis not present

## 2014-09-18 DIAGNOSIS — N390 Urinary tract infection, site not specified: Secondary | ICD-10-CM | POA: Diagnosis not present

## 2014-09-18 DIAGNOSIS — N3001 Acute cystitis with hematuria: Secondary | ICD-10-CM | POA: Diagnosis not present

## 2014-09-18 DIAGNOSIS — R300 Dysuria: Secondary | ICD-10-CM | POA: Diagnosis not present

## 2014-09-20 DIAGNOSIS — H3532 Exudative age-related macular degeneration: Secondary | ICD-10-CM | POA: Diagnosis not present

## 2014-10-04 DIAGNOSIS — R350 Frequency of micturition: Secondary | ICD-10-CM | POA: Diagnosis not present

## 2014-10-04 DIAGNOSIS — N390 Urinary tract infection, site not specified: Secondary | ICD-10-CM | POA: Diagnosis not present

## 2014-11-05 DIAGNOSIS — Z96611 Presence of right artificial shoulder joint: Secondary | ICD-10-CM | POA: Diagnosis not present

## 2014-11-05 DIAGNOSIS — Z471 Aftercare following joint replacement surgery: Secondary | ICD-10-CM | POA: Diagnosis not present

## 2014-11-15 DIAGNOSIS — H31012 Macula scars of posterior pole (postinflammatory) (post-traumatic), left eye: Secondary | ICD-10-CM | POA: Diagnosis not present

## 2014-11-15 DIAGNOSIS — Z961 Presence of intraocular lens: Secondary | ICD-10-CM | POA: Diagnosis not present

## 2014-11-15 DIAGNOSIS — H2512 Age-related nuclear cataract, left eye: Secondary | ICD-10-CM | POA: Diagnosis not present

## 2014-11-15 DIAGNOSIS — H3532 Exudative age-related macular degeneration: Secondary | ICD-10-CM | POA: Diagnosis not present

## 2014-12-12 ENCOUNTER — Other Ambulatory Visit: Payer: Self-pay | Admitting: Internal Medicine

## 2014-12-12 DIAGNOSIS — R1011 Right upper quadrant pain: Secondary | ICD-10-CM | POA: Diagnosis not present

## 2014-12-12 DIAGNOSIS — R1084 Generalized abdominal pain: Secondary | ICD-10-CM

## 2014-12-12 DIAGNOSIS — R1012 Left upper quadrant pain: Secondary | ICD-10-CM | POA: Diagnosis not present

## 2014-12-13 ENCOUNTER — Ambulatory Visit
Admission: RE | Admit: 2014-12-13 | Discharge: 2014-12-13 | Disposition: A | Discharge status: Home / Self Care | Payer: Medicare Other | Source: Ambulatory Visit | Attending: Internal Medicine | Admitting: Internal Medicine

## 2014-12-13 DIAGNOSIS — R1084 Generalized abdominal pain: Secondary | ICD-10-CM

## 2014-12-13 DIAGNOSIS — R110 Nausea: Secondary | ICD-10-CM | POA: Diagnosis not present

## 2014-12-13 DIAGNOSIS — R109 Unspecified abdominal pain: Secondary | ICD-10-CM | POA: Diagnosis not present

## 2014-12-27 DIAGNOSIS — M159 Polyosteoarthritis, unspecified: Secondary | ICD-10-CM | POA: Diagnosis not present

## 2014-12-27 DIAGNOSIS — M810 Age-related osteoporosis without current pathological fracture: Secondary | ICD-10-CM | POA: Diagnosis not present

## 2015-01-03 DIAGNOSIS — H3532 Exudative age-related macular degeneration: Secondary | ICD-10-CM | POA: Diagnosis not present

## 2015-01-22 DIAGNOSIS — M810 Age-related osteoporosis without current pathological fracture: Secondary | ICD-10-CM | POA: Diagnosis not present

## 2015-01-22 DIAGNOSIS — M159 Polyosteoarthritis, unspecified: Secondary | ICD-10-CM | POA: Diagnosis not present

## 2015-02-28 DIAGNOSIS — H3532 Exudative age-related macular degeneration: Secondary | ICD-10-CM | POA: Diagnosis not present

## 2015-03-05 DIAGNOSIS — R300 Dysuria: Secondary | ICD-10-CM | POA: Diagnosis not present

## 2015-04-11 DIAGNOSIS — R300 Dysuria: Secondary | ICD-10-CM | POA: Diagnosis not present

## 2015-04-11 DIAGNOSIS — N898 Other specified noninflammatory disorders of vagina: Secondary | ICD-10-CM | POA: Diagnosis not present

## 2015-04-16 DIAGNOSIS — N390 Urinary tract infection, site not specified: Secondary | ICD-10-CM | POA: Diagnosis not present

## 2015-04-16 DIAGNOSIS — N952 Postmenopausal atrophic vaginitis: Secondary | ICD-10-CM | POA: Diagnosis not present

## 2015-04-25 DIAGNOSIS — H3532 Exudative age-related macular degeneration: Secondary | ICD-10-CM | POA: Diagnosis not present

## 2015-04-30 DIAGNOSIS — M810 Age-related osteoporosis without current pathological fracture: Secondary | ICD-10-CM | POA: Diagnosis not present

## 2015-05-06 DIAGNOSIS — L940 Localized scleroderma [morphea]: Secondary | ICD-10-CM | POA: Diagnosis not present

## 2015-05-07 DIAGNOSIS — M810 Age-related osteoporosis without current pathological fracture: Secondary | ICD-10-CM | POA: Diagnosis not present

## 2015-05-28 DIAGNOSIS — M159 Polyosteoarthritis, unspecified: Secondary | ICD-10-CM | POA: Diagnosis not present

## 2015-06-04 DIAGNOSIS — N898 Other specified noninflammatory disorders of vagina: Secondary | ICD-10-CM | POA: Diagnosis not present

## 2015-06-04 DIAGNOSIS — L940 Localized scleroderma [morphea]: Secondary | ICD-10-CM | POA: Diagnosis not present

## 2015-06-04 DIAGNOSIS — N904 Leukoplakia of vulva: Secondary | ICD-10-CM | POA: Diagnosis not present

## 2015-06-11 DIAGNOSIS — Z0000 Encounter for general adult medical examination without abnormal findings: Secondary | ICD-10-CM | POA: Diagnosis not present

## 2015-06-11 DIAGNOSIS — E780 Pure hypercholesterolemia: Secondary | ICD-10-CM | POA: Diagnosis not present

## 2015-06-11 DIAGNOSIS — I10 Essential (primary) hypertension: Secondary | ICD-10-CM | POA: Diagnosis not present

## 2015-06-18 DIAGNOSIS — Z961 Presence of intraocular lens: Secondary | ICD-10-CM | POA: Diagnosis not present

## 2015-06-18 DIAGNOSIS — H2512 Age-related nuclear cataract, left eye: Secondary | ICD-10-CM | POA: Diagnosis not present

## 2015-06-18 DIAGNOSIS — I10 Essential (primary) hypertension: Secondary | ICD-10-CM | POA: Diagnosis not present

## 2015-06-18 DIAGNOSIS — E039 Hypothyroidism, unspecified: Secondary | ICD-10-CM | POA: Diagnosis not present

## 2015-06-18 DIAGNOSIS — H31012 Macula scars of posterior pole (postinflammatory) (post-traumatic), left eye: Secondary | ICD-10-CM | POA: Diagnosis not present

## 2015-06-18 DIAGNOSIS — I340 Nonrheumatic mitral (valve) insufficiency: Secondary | ICD-10-CM | POA: Diagnosis not present

## 2015-06-18 DIAGNOSIS — H3532 Exudative age-related macular degeneration: Secondary | ICD-10-CM | POA: Diagnosis not present

## 2015-06-18 DIAGNOSIS — Z0000 Encounter for general adult medical examination without abnormal findings: Secondary | ICD-10-CM | POA: Diagnosis not present

## 2015-07-08 DIAGNOSIS — N949 Unspecified condition associated with female genital organs and menstrual cycle: Secondary | ICD-10-CM | POA: Diagnosis not present

## 2015-07-08 DIAGNOSIS — N898 Other specified noninflammatory disorders of vagina: Secondary | ICD-10-CM | POA: Diagnosis not present

## 2015-07-08 DIAGNOSIS — R300 Dysuria: Secondary | ICD-10-CM | POA: Diagnosis not present

## 2015-07-16 DIAGNOSIS — R300 Dysuria: Secondary | ICD-10-CM | POA: Diagnosis not present

## 2015-07-16 DIAGNOSIS — R350 Frequency of micturition: Secondary | ICD-10-CM | POA: Diagnosis not present

## 2015-07-23 DIAGNOSIS — R300 Dysuria: Secondary | ICD-10-CM | POA: Diagnosis not present

## 2015-07-23 DIAGNOSIS — N952 Postmenopausal atrophic vaginitis: Secondary | ICD-10-CM | POA: Diagnosis not present

## 2015-07-23 DIAGNOSIS — N390 Urinary tract infection, site not specified: Secondary | ICD-10-CM | POA: Diagnosis not present

## 2015-07-30 DIAGNOSIS — M419 Scoliosis, unspecified: Secondary | ICD-10-CM | POA: Diagnosis not present

## 2015-07-30 DIAGNOSIS — M159 Polyosteoarthritis, unspecified: Secondary | ICD-10-CM | POA: Diagnosis not present

## 2015-07-30 DIAGNOSIS — Z79891 Long term (current) use of opiate analgesic: Secondary | ICD-10-CM | POA: Diagnosis not present

## 2015-07-30 DIAGNOSIS — M4800 Spinal stenosis, site unspecified: Secondary | ICD-10-CM | POA: Diagnosis not present

## 2015-08-04 ENCOUNTER — Observation Stay (HOSPITAL_COMMUNITY)
Admission: EM | Admit: 2015-08-04 | Discharge: 2015-08-06 | Disposition: A | Discharge status: Home / Self Care | Payer: Medicare Other | Attending: Internal Medicine | Admitting: Internal Medicine

## 2015-08-04 DIAGNOSIS — D72829 Elevated white blood cell count, unspecified: Secondary | ICD-10-CM | POA: Diagnosis not present

## 2015-08-04 DIAGNOSIS — M549 Dorsalgia, unspecified: Secondary | ICD-10-CM | POA: Diagnosis not present

## 2015-08-04 DIAGNOSIS — K449 Diaphragmatic hernia without obstruction or gangrene: Secondary | ICD-10-CM | POA: Diagnosis not present

## 2015-08-04 DIAGNOSIS — Z66 Do not resuscitate: Secondary | ICD-10-CM | POA: Insufficient documentation

## 2015-08-04 DIAGNOSIS — I10 Essential (primary) hypertension: Secondary | ICD-10-CM | POA: Diagnosis present

## 2015-08-04 DIAGNOSIS — E785 Hyperlipidemia, unspecified: Secondary | ICD-10-CM | POA: Diagnosis not present

## 2015-08-04 DIAGNOSIS — K219 Gastro-esophageal reflux disease without esophagitis: Secondary | ICD-10-CM | POA: Diagnosis not present

## 2015-08-04 DIAGNOSIS — M1990 Unspecified osteoarthritis, unspecified site: Secondary | ICD-10-CM | POA: Diagnosis present

## 2015-08-04 DIAGNOSIS — R079 Chest pain, unspecified: Secondary | ICD-10-CM | POA: Diagnosis present

## 2015-08-04 DIAGNOSIS — Z79891 Long term (current) use of opiate analgesic: Secondary | ICD-10-CM | POA: Insufficient documentation

## 2015-08-04 DIAGNOSIS — R112 Nausea with vomiting, unspecified: Secondary | ICD-10-CM | POA: Insufficient documentation

## 2015-08-04 DIAGNOSIS — Z87891 Personal history of nicotine dependence: Secondary | ICD-10-CM | POA: Diagnosis not present

## 2015-08-04 DIAGNOSIS — R0602 Shortness of breath: Secondary | ICD-10-CM | POA: Insufficient documentation

## 2015-08-04 DIAGNOSIS — Z791 Long term (current) use of non-steroidal anti-inflammatories (NSAID): Secondary | ICD-10-CM | POA: Insufficient documentation

## 2015-08-04 DIAGNOSIS — R52 Pain, unspecified: Secondary | ICD-10-CM

## 2015-08-04 DIAGNOSIS — R0789 Other chest pain: Principal | ICD-10-CM | POA: Insufficient documentation

## 2015-08-04 DIAGNOSIS — R110 Nausea: Secondary | ICD-10-CM | POA: Diagnosis not present

## 2015-08-04 DIAGNOSIS — Z79899 Other long term (current) drug therapy: Secondary | ICD-10-CM | POA: Diagnosis not present

## 2015-08-04 DIAGNOSIS — I340 Nonrheumatic mitral (valve) insufficiency: Secondary | ICD-10-CM | POA: Diagnosis not present

## 2015-08-04 MED ORDER — NITROGLYCERIN 2 % TD OINT
0.5000 [in_us] | TOPICAL_OINTMENT | Freq: Once | TRANSDERMAL | Status: AC
  Administered 2015-08-05: 0.5 [in_us] via TOPICAL
  Filled 2015-08-04: qty 1

## 2015-08-04 NOTE — ED Provider Notes (Signed)
CSN: [id number]     Arrival date & time 08/04/15  2350 History   First MD Initiated Contact with Patient 08/04/15 2352     Chief Complaint  Patient presents with  . Chest Pain     (Consider location/radiation/quality/duration/timing/severity/associated sxs/prior Treatment) HPI Comments: Patient is an 81 yo F PMHx significant for HLD, mild mitral regurgitation, HTN presenting to the ED for left sided chest pressure with radiation to the jaw that began at 9PM this evening. She endorses associated nausea without vomiting. Denies any shortness of breath. Endorses little improvement of pain after receiving 2 sublingual measures glycerin. Unable to receive aspirin due to allergy. Currently rates her pain a 9 out of 10. No history of cardiac catheterization or MIs.   Past Medical History  Diagnosis Date  . Hyperlipidemia   . Mitral regurgitation     mild by echo 2003  . Allergic rhinitis   . Actinic keratosis   . Tenosynovitis     myxomatous  . Chronic constipation   . GERD (gastroesophageal reflux disease)   . Osteoarthritis   . Back pain   . Macular degeneration   . Hypertension   . Osteoarthrosis   . Osteoporosis   . Hyponatremia   . Leg edema    Past Surgical History  Procedure Laterality Date  . Synovectomy      of right long finger  . Hernial repain    . Inguinal hernia repair      laparoscopic preperitoneal repair of bilateral inguinal hernias  . Carpal tunnel release      bilateral  . Replacement total knee bilateral    . Tonisellectomy    . Tonisillectomy    . Tonsillectomy    . Reverse shoulder arthroplasty Right 05/08/2014    Procedure: REVERSE SHOULDER ARTHROPLASTY RIGHT ;  Surgeon: Marin Shutter, MD;  Location: [facility];  Service: Orthopedics;  Laterality: Right;   Family History  Problem Relation Age of Onset  . Heart attack Father   . Heart attack Brother    Social History  Substance Use Topics  . Smoking status: Former Smoker -- 1.00 packs/day for 2 years     Types: Cigarettes  . Smokeless tobacco: None  . Alcohol Use: 1.2 oz/week    2 Glasses of wine per week     Comment: 2 glasses per week   OB History    No data available     Review of Systems  Respiratory: Negative for shortness of breath.   Cardiovascular: Positive for chest pain. Negative for leg swelling.  Gastrointestinal: Positive for nausea.  All other systems reviewed and are negative.     Allergies  Aspirin; Boniva; Morphine; Nitrofuran derivatives; Penicillins; and Sulfonamide derivatives  Home Medications   Prior to Admission medications   Medication Sig Start Date End Date Taking? Authorizing Provider  bisacodyl (DULCOLAX) 5 MG EC tablet Take 1 tablet (5 mg total) by mouth daily as needed for mild constipation or moderate constipation. 05/10/14  Yes Orson Eva, MD  calcium citrate-vitamin D (CITRACAL+D) 315-200 MG-UNIT per tablet Take 1 tablet by mouth daily.    Yes Historical Provider, MD  cetirizine (ZYRTEC) 10 MG tablet Take 15 mg by mouth daily.    Yes Historical Provider, MD  clobetasol cream (TEMOVATE) 2.50 % Apply 1 application topically 2 (two) times a week.   Yes Historical Provider, MD  conjugated estrogens (PREMARIN) vaginal cream Place 1 Applicatorful vaginally 3 (three) times a week.   Yes Historical Provider, MD  diclofenac sodium (VOLTAREN) 1 % GEL Apply 2 g topically 2 (two) times daily as needed (pain).    Yes Historical Provider, MD  irbesartan (AVAPRO) 75 MG tablet Take 1 tablet (75 mg total) by mouth daily. 05/10/14  Yes Orson Eva, MD  Multiple Vitamin (MULTIVITAMIN) capsule Take 1 capsule by mouth daily.    Yes Historical Provider, MD  Multiple Vitamins-Minerals (PRESERVISION AREDS 2 PO) Take 1 tablet by mouth 2 (two) times daily.    Yes Historical Provider, MD  oxyCODONE-acetaminophen (PERCOCET/ROXICET) 5-325 MG per tablet Take 1-2 tablets by mouth every 4 (four) hours as needed for severe pain. 05/10/14  Yes Orson Eva, MD  polyethylene glycol  (MIRALAX / GLYCOLAX) packet Take 17 g by mouth daily as needed for mild constipation. 05/10/14  Yes Orson Eva, MD  PRESCRIPTION MEDICATION Apply topically daily as needed (vaginal yeast infection.).    Yes Historical Provider, MD  PRESCRIPTION MEDICATION Place 1 application around the anus daily as needed (itching).   Yes Historical Provider, MD  Probiotic Product (Palo) Take 1 tablet by mouth daily.   Yes Historical Provider, MD  Ranibizumab (LUCENTIS IO) Inject into the eye every 8 (eight) weeks. Injection in right eye   Yes Historical Provider, MD  trimethoprim (TRIMPEX) 100 MG tablet Take 100 mg by mouth daily. UTI prophylaxis.   Yes Historical Provider, MD  zoledronic acid (RECLAST) 5 MG/100ML SOLN injection Inject 5 mg into the vein. Once annually.   Yes Historical Provider, MD  promethazine (PHENERGAN) 12.5 MG tablet Take 1-2 tablets (12.5-25 mg total) by mouth every 6 (six) hours as needed for nausea. 07/01/12 07/08/12  Danae Orleans, PA-C   BP 153/72 mmHg  Pulse 96  Temp(Src) 98.1 F (36.7 C) (Oral)  Resp 20  SpO2 96% Physical Exam  Constitutional: She is oriented to person, place, and time. She appears well-developed and well-nourished. She appears distressed.  HENT:  Head: Normocephalic and atraumatic.  Eyes: Conjunctivae are normal.  Neck: Neck supple.  Cardiovascular: Normal rate, regular rhythm and normal heart sounds.   Pulmonary/Chest: Effort normal and breath sounds normal.  Abdominal: Soft. There is no tenderness.  Neurological: She is alert and oriented to person, place, and time.  Skin: Skin is warm. She is diaphoretic.  Nursing note and vitals reviewed.   ED Course  Procedures (including critical care time) Medications  nitroGLYCERIN (NITROGLYN) 2 % ointment 0.5 inch (0.5 inches Topical Given 08/05/15 0013)  promethazine (PHENERGAN) injection 12.5 mg (12.5 mg Intravenous Given 08/05/15 0005)  fentaNYL (SUBLIMAZE) injection 100 mcg (50 mcg Intravenous  Given 08/05/15 0110)  fentaNYL (SUBLIMAZE) 100 MCG/2ML injection (  Duplicate 04/02/26 0623)  iohexol (OMNIPAQUE) 350 MG/ML injection 100 mL (100 mLs Intravenous Contrast Given 08/05/15 0156)  fentaNYL (SUBLIMAZE) injection 50 mcg ( Intravenous Duplicate 06/05/27 3151)  promethazine (PHENERGAN) injection 12.5 mg (12.5 mg Intravenous Given 08/05/15 0421)    Labs Review Labs Reviewed  COMPREHENSIVE METABOLIC PANEL - Abnormal; Notable for the following:    Sodium 134 (*)    Chloride 97 (*)    Glucose, Bld 149 (*)    All other components within normal limits  CBC WITH DIFFERENTIAL/PLATELET - Abnormal; Notable for the following:    WBC 11.3 (*)    Neutrophils Relative % 82 (*)    Neutro Abs 9.1 (*)    Lymphocytes Relative 11 (*)    All other components within normal limits  I-STAT TROPOININ, ED  I-STAT CREATININE, ED  Randolm Idol, ED  Imaging Review Dg Chest Port 1 View  08/05/2015   CLINICAL DATA:  Chest pain and shortness of breath tonight.  EXAM: PORTABLE CHEST - 1 VIEW  COMPARISON:  05/05/2014  FINDINGS: Shallow inspiration. Heart size and pulmonary vascularity are normal for technique. Large esophageal hiatal hernia behind the heart. Linear atelectasis in the lung bases. No focal consolidation. No blunting of costophrenic angles. No pneumothorax. Calcification of the aorta. Thoracic scoliosis convex towards the right. Postoperative changes in the right shoulder. Degenerative changes in the left shoulder.  IMPRESSION: No evidence of active pulmonary disease. Esophageal hiatal hernia behind the heart.   Electronically Signed   By: Lucienne Capers M.D.   On: 08/05/2015 01:32   Ct Angio Chest Aorta W/cm &/or Wo/cm  08/05/2015   CLINICAL DATA:  Chest pain and upper back pain.  EXAM: CT ANGIOGRAPHY CHEST, ABDOMEN AND PELVIS  TECHNIQUE: Multidetector CT imaging through the chest, abdomen and pelvis was performed using the standard protocol during bolus administration of intravenous contrast.  Multiplanar reconstructed images and MIPs were obtained and reviewed to evaluate the vascular anatomy.  CONTRAST:  142mL OMNIPAQUE IOHEXOL 350 MG/ML SOLN  COMPARISON:  None.  FINDINGS: CTA CHEST FINDINGS  Noncontrast axial images are obtained. Examination is limited due to motion artifact. Calcification of the thoracic aorta. Calcification of coronary arteries. No aortic dilatation. No evidence of intramural hematoma.  Subsequent images obtained during arterial phase of contrast administration. Heart size is normal. Normal caliber thoracic aorta. No evidence of aortic dissection. Great vessel origins are patent. Motion artifact limits examination. There is a large esophageal hiatal hernia with a large amount of fluid-filled stomach present in the left lower chest. Esophagus is mildly dilated with fluid in the esophagus possibly due to reflux. This does not appear to represent gastric volvulus.  Atelectasis or consolidation in the left lung base. Slight fibrosis in the lungs. No pleural effusions. No pneumothorax. Postoperative changes in the right shoulder.  Review of the MIP images confirms the above findings.  CTA ABDOMEN AND PELVIS FINDINGS  Tortuous abdominal aorta with scattered calcifications. Calcification in the major vessel origins. No evidence of aortic dissection or aneurysm. The abdominal aorta, celiac axis, superior mesenteric artery, single bilateral renal arteries, inferior mesenteric artery, and bilateral iliac, external iliac, internal iliac, and common femoral arteries are patent.  The liver, spleen, gallbladder, pancreas, adrenal glands, kidneys, inferior vena cava, and retroperitoneal lymph nodes are unremarkable. Small bowel and colon are not abnormally distended. No free air or free fluid in the abdomen.  Pelvis: Appendix is normal. Bladder wall is not thickened. No pelvic mass or lymphadenopathy. Focal loculated fluid collection in the left pelvis probably represents a bladder diverticulum  although visualization is limited due to streak artifact from left hip arthroplasty. Postoperative changes consistent with mesh repair of the anterior abdominal wall. No residual or recurrent hernia is identified.  Bones: Diffuse bone demineralization. Thoracolumbar scoliosis with prominent degenerative changes throughout. No destructive bone lesions.  Review of the MIP images confirms the above findings.  IMPRESSION: No evidence of dissection of the thoracic or of the abdominal aorta. No aneurysm or significant vascular occlusion.  Large hiatal hernia with most of the stomach in the chest. Stomach is fluid filled with possible reflux into the esophagus. No definite finding of obstruction or volvulus. Atelectasis or consolidation in the left lung base. Probable bladder diverticulum in the pelvis.   Electronically Signed   By: Lucienne Capers M.D.   On: 08/05/2015 03:07   Ct  Cta Abd/pel W/cm &/or W/o Cm  08/05/2015   CLINICAL DATA:  Chest pain and upper back pain.  EXAM: CT ANGIOGRAPHY CHEST, ABDOMEN AND PELVIS  TECHNIQUE: Multidetector CT imaging through the chest, abdomen and pelvis was performed using the standard protocol during bolus administration of intravenous contrast. Multiplanar reconstructed images and MIPs were obtained and reviewed to evaluate the vascular anatomy.  CONTRAST:  125mL OMNIPAQUE IOHEXOL 350 MG/ML SOLN  COMPARISON:  None.  FINDINGS: CTA CHEST FINDINGS  Noncontrast axial images are obtained. Examination is limited due to motion artifact. Calcification of the thoracic aorta. Calcification of coronary arteries. No aortic dilatation. No evidence of intramural hematoma.  Subsequent images obtained during arterial phase of contrast administration. Heart size is normal. Normal caliber thoracic aorta. No evidence of aortic dissection. Great vessel origins are patent. Motion artifact limits examination. There is a large esophageal hiatal hernia with a large amount of fluid-filled stomach present  in the left lower chest. Esophagus is mildly dilated with fluid in the esophagus possibly due to reflux. This does not appear to represent gastric volvulus.  Atelectasis or consolidation in the left lung base. Slight fibrosis in the lungs. No pleural effusions. No pneumothorax. Postoperative changes in the right shoulder.  Review of the MIP images confirms the above findings.  CTA ABDOMEN AND PELVIS FINDINGS  Tortuous abdominal aorta with scattered calcifications. Calcification in the major vessel origins. No evidence of aortic dissection or aneurysm. The abdominal aorta, celiac axis, superior mesenteric artery, single bilateral renal arteries, inferior mesenteric artery, and bilateral iliac, external iliac, internal iliac, and common femoral arteries are patent.  The liver, spleen, gallbladder, pancreas, adrenal glands, kidneys, inferior vena cava, and retroperitoneal lymph nodes are unremarkable. Small bowel and colon are not abnormally distended. No free air or free fluid in the abdomen.  Pelvis: Appendix is normal. Bladder wall is not thickened. No pelvic mass or lymphadenopathy. Focal loculated fluid collection in the left pelvis probably represents a bladder diverticulum although visualization is limited due to streak artifact from left hip arthroplasty. Postoperative changes consistent with mesh repair of the anterior abdominal wall. No residual or recurrent hernia is identified.  Bones: Diffuse bone demineralization. Thoracolumbar scoliosis with prominent degenerative changes throughout. No destructive bone lesions.  Review of the MIP images confirms the above findings.  IMPRESSION: No evidence of dissection of the thoracic or of the abdominal aorta. No aneurysm or significant vascular occlusion.  Large hiatal hernia with most of the stomach in the chest. Stomach is fluid filled with possible reflux into the esophagus. No definite finding of obstruction or volvulus. Atelectasis or consolidation in the left  lung base. Probable bladder diverticulum in the pelvis.   Electronically Signed   By: Lucienne Capers M.D.   On: 08/05/2015 03:07   I have personally reviewed and evaluated these images and lab results as part of my medical decision-making.   EKG Interpretation   Date/Time:  Monday August 05 2015 01:10:49 EDT Ventricular Rate:  89 PR Interval:  189 QRS Duration: 88 QT Interval:  368 QTC Calculation: 448 R Axis:   -45 Text Interpretation:  Sinus rhythm Ventricular premature complex Probable  left atrial enlargement Left anterior fascicular block Low voltage,  precordial leads Abnormal R-wave progression, early transition Consider  anterior infarct No significant change since last tracing Confirmed by  WARD,  DO, KRISTEN ([id number]) on 08/05/2015 3:16:26 AM      MDM   Final diagnoses:  Pain  Acute chest pain    Filed Vitals:  08/05/15 0531  BP: 153/72  Pulse: 96  Temp: 98.1 F (36.7 C)  Resp: 20   Afebrile, NAD, non-toxic appearing, AAOx4.   Concern for cardiac etiology of Chest Pain. Hospitalist has been consulted and will see patient in the ED for likely admit. Pt does not meet criteria for CP protocol and a further evaluation is recommended. Pt has been re-evaluated prior to consult and VSS, NAD, heart RRR,  lungs CTAB. CT scans without evidence of dissection. No acute abnormalities found on EKG and first round of cardiac enzymes negative. This case was discussed with Dr. Leonides Schanz who has seen the patient and agrees with plan to admit.     Baron Sane, PA-C 08/05/15 [id number]

## 2015-08-04 NOTE — ED Notes (Signed)
Per EMS: Pt was at home eating dinner with new onset chest pain that radiates to L arm and jaw. Pt has been N/V x 3, EMS gave 8mg  zofran enroute. Pt denies any SOB. EMS gave nitro x2, did NOT give ASA, due to allergy. Pt BP 207/113 upon EMS arrival, on arrival here, pt BP = 165/99. Pt at 9/10 pain.

## 2015-08-05 ENCOUNTER — Emergency Department (HOSPITAL_COMMUNITY): Payer: Medicare Other

## 2015-08-05 ENCOUNTER — Encounter (HOSPITAL_COMMUNITY): Payer: Self-pay

## 2015-08-05 DIAGNOSIS — R079 Chest pain, unspecified: Secondary | ICD-10-CM | POA: Diagnosis not present

## 2015-08-05 DIAGNOSIS — R0602 Shortness of breath: Secondary | ICD-10-CM | POA: Diagnosis not present

## 2015-08-05 DIAGNOSIS — M549 Dorsalgia, unspecified: Secondary | ICD-10-CM | POA: Diagnosis not present

## 2015-08-05 DIAGNOSIS — R0789 Other chest pain: Secondary | ICD-10-CM | POA: Diagnosis not present

## 2015-08-05 DIAGNOSIS — I10 Essential (primary) hypertension: Secondary | ICD-10-CM | POA: Diagnosis not present

## 2015-08-05 LAB — COMPREHENSIVE METABOLIC PANEL
ALT: 19 U/L (ref 14–54)
AST: 31 U/L (ref 15–41)
Albumin: 4 g/dL (ref 3.5–5.0)
Alkaline Phosphatase: 71 U/L (ref 38–126)
Anion gap: 10 (ref 5–15)
BILIRUBIN TOTAL: 0.4 mg/dL (ref 0.3–1.2)
BUN: 15 mg/dL (ref 6–20)
CO2: 27 mmol/L (ref 22–32)
Calcium: 9.3 mg/dL (ref 8.9–10.3)
Chloride: 97 mmol/L — ABNORMAL LOW (ref 101–111)
Creatinine, Ser: 0.66 mg/dL (ref 0.44–1.00)
GFR calc Af Amer: >60 mL/min (ref >60)
GFR calc non Af Amer: >60 mL/min (ref >60)
GLUCOSE: 149 mg/dL — AB (ref 65–99)
Potassium: 3.8 mmol/L (ref 3.5–5.1)
Sodium: 134 mmol/L — ABNORMAL LOW (ref 135–145)
TOTAL PROTEIN: 6.8 g/dL (ref 6.5–8.1)

## 2015-08-05 LAB — I-STAT TROPONIN, ED
Troponin i, poc: 0.01 ng/mL (ref 0.00–0.08)
Troponin i, poc: 0.01 ng/mL (ref 0.00–0.08)

## 2015-08-05 LAB — CBC WITH DIFFERENTIAL/PLATELET
BASOS ABS: 0 10*3/uL (ref 0.0–0.1)
Basophils Relative: 0 % (ref 0–1)
EOS PCT: 0 % (ref 0–5)
Eosinophils Absolute: 0 10*3/uL (ref 0.0–0.7)
HCT: 37.4 % (ref 36.0–46.0)
Hemoglobin: 12.6 g/dL (ref 12.0–15.0)
LYMPHS ABS: 1.2 10*3/uL (ref 0.7–4.0)
Lymphocytes Relative: 11 % — ABNORMAL LOW (ref 12–46)
MCH: 30.9 pg (ref 26.0–34.0)
MCHC: 33.7 g/dL (ref 30.0–36.0)
MCV: 91.7 fL (ref 78.0–100.0)
MONO ABS: 0.8 10*3/uL (ref 0.1–1.0)
Monocytes Relative: 7 % (ref 3–12)
Neutro Abs: 9.1 10*3/uL — ABNORMAL HIGH (ref 1.7–7.7)
Neutrophils Relative %: 82 % — ABNORMAL HIGH (ref 43–77)
PLATELETS: 300 10*3/uL (ref 150–400)
RBC: 4.08 MIL/uL (ref 3.87–5.11)
RDW: 12.7 % (ref 11.5–15.5)
WBC: 11.3 10*3/uL — ABNORMAL HIGH (ref 4.0–10.5)

## 2015-08-05 LAB — CBC
HCT: 36.8 % (ref 36.0–46.0)
HEMOGLOBIN: 12.6 g/dL (ref 12.0–15.0)
MCH: 30.9 pg (ref 26.0–34.0)
MCHC: 34.2 g/dL (ref 30.0–36.0)
MCV: 90.2 fL (ref 78.0–100.0)
Platelets: 286 10*3/uL (ref 150–400)
RBC: 4.08 MIL/uL (ref 3.87–5.11)
RDW: 12.7 % (ref 11.5–15.5)
WBC: 13.6 10*3/uL — ABNORMAL HIGH (ref 4.0–10.5)

## 2015-08-05 LAB — LIPASE, BLOOD: LIPASE: 23 U/L (ref 22–51)

## 2015-08-05 LAB — CREATININE, SERUM
CREATININE: 0.69 mg/dL (ref 0.44–1.00)
GFR calc Af Amer: >60 mL/min (ref >60)
GFR calc non Af Amer: >60 mL/min (ref >60)

## 2015-08-05 LAB — I-STAT CREATININE, ED: CREATININE: 0.7 mg/dL (ref 0.44–1.00)

## 2015-08-05 LAB — TROPONIN I
TROPONIN I: 0.05 ng/mL — AB (ref <0.031)
Troponin I: 0.03 ng/mL (ref <0.031)

## 2015-08-05 MED ORDER — POLYETHYLENE GLYCOL 3350 17 G PO PACK
17.0000 g | PACK | Freq: Every day | ORAL | Status: DC | PRN
Start: 2015-08-05 — End: 2015-08-06
  Administered 2015-08-05: 17 g via ORAL
  Filled 2015-08-05: qty 1

## 2015-08-05 MED ORDER — IRBESARTAN 75 MG PO TABS
75.0000 mg | ORAL_TABLET | Freq: Every day | ORAL | Status: DC
  Administered 2015-08-05 – 2015-08-06 (×2): 75 mg via ORAL
  Filled 2015-08-05 (×2): qty 1

## 2015-08-05 MED ORDER — CALCIUM CARBONATE-VITAMIN D 500-200 MG-UNIT PO TABS
1.0000 | ORAL_TABLET | Freq: Every day | ORAL | Status: DC
  Administered 2015-08-06: 1 via ORAL
  Filled 2015-08-05 (×3): qty 1

## 2015-08-05 MED ORDER — FENTANYL CITRATE (PF) 100 MCG/2ML IJ SOLN
50.0000 ug | Freq: Once | INTRAMUSCULAR | Status: AC
  Administered 2015-08-05: 50 ug via INTRAVENOUS

## 2015-08-05 MED ORDER — SUCRALFATE 1 G PO TABS
1.0000 g | ORAL_TABLET | Freq: Three times a day (TID) | ORAL | Status: DC
  Administered 2015-08-05 – 2015-08-06 (×4): 1 g via ORAL
  Filled 2015-08-05 (×8): qty 1

## 2015-08-05 MED ORDER — IOHEXOL 350 MG/ML SOLN
100.0000 mL | Freq: Once | INTRAVENOUS | Status: AC | PRN
  Administered 2015-08-05: 100 mL via INTRAVENOUS

## 2015-08-05 MED ORDER — FENTANYL CITRATE (PF) 100 MCG/2ML IJ SOLN
100.0000 ug | Freq: Once | INTRAMUSCULAR | Status: AC
  Administered 2015-08-05: 50 ug via INTRAVENOUS

## 2015-08-05 MED ORDER — CLOPIDOGREL BISULFATE 75 MG PO TABS
75.0000 mg | ORAL_TABLET | Freq: Every day | ORAL | Status: DC
  Administered 2015-08-05 – 2015-08-06 (×2): 75 mg via ORAL
  Filled 2015-08-05 (×3): qty 1

## 2015-08-05 MED ORDER — PROMETHAZINE HCL 25 MG/ML IJ SOLN
12.5000 mg | Freq: Once | INTRAMUSCULAR | Status: AC
  Administered 2015-08-05: 12.5 mg via INTRAVENOUS
  Filled 2015-08-05: qty 1

## 2015-08-05 MED ORDER — LORATADINE 10 MG PO TABS
10.0000 mg | ORAL_TABLET | Freq: Every day | ORAL | Status: DC
  Administered 2015-08-06: 10 mg via ORAL
  Filled 2015-08-05 (×2): qty 1

## 2015-08-05 MED ORDER — BISACODYL 5 MG PO TBEC: 5.0000 mg | DELAYED_RELEASE_TABLET | Freq: Every day | ORAL | Status: DC | PRN

## 2015-08-05 MED ORDER — PANTOPRAZOLE SODIUM 40 MG IV SOLR
40.0000 mg | Freq: Two times a day (BID) | INTRAVENOUS | Status: DC
  Administered 2015-08-05 – 2015-08-06 (×3): 40 mg via INTRAVENOUS
  Filled 2015-08-05 (×6): qty 40

## 2015-08-05 MED ORDER — OXYCODONE-ACETAMINOPHEN 5-325 MG PO TABS
1.0000 | ORAL_TABLET | ORAL | Status: DC | PRN
  Administered 2015-08-05 – 2015-08-06 (×3): 2 via ORAL
  Filled 2015-08-05 (×3): qty 2

## 2015-08-05 MED ORDER — FENTANYL CITRATE (PF) 100 MCG/2ML IJ SOLN: 25.0000 ug | INTRAMUSCULAR | Status: DC | PRN

## 2015-08-05 MED ORDER — ENOXAPARIN SODIUM 40 MG/0.4ML ~~LOC~~ SOLN
40.0000 mg | Freq: Every day | SUBCUTANEOUS | Status: DC
  Administered 2015-08-05 – 2015-08-06 (×2): 40 mg via SUBCUTANEOUS
  Filled 2015-08-05 (×3): qty 0.4

## 2015-08-05 MED ORDER — SODIUM CHLORIDE 0.9 % IV SOLN
INTRAVENOUS | Status: AC
  Administered 2015-08-05 – 2015-08-06 (×2): via INTRAVENOUS

## 2015-08-05 MED ORDER — DICLOFENAC SODIUM 1 % TD GEL
2.0000 g | Freq: Two times a day (BID) | TRANSDERMAL | Status: DC | PRN
  Filled 2015-08-05: qty 100

## 2015-08-05 MED ORDER — TRIMETHOPRIM 100 MG PO TABS
100.0000 mg | ORAL_TABLET | Freq: Every day | ORAL | Status: DC
  Administered 2015-08-05 – 2015-08-06 (×2): 100 mg via ORAL
  Filled 2015-08-05 (×2): qty 1

## 2015-08-05 MED ORDER — ACETAMINOPHEN 325 MG PO TABS
650.0000 mg | ORAL_TABLET | ORAL | Status: DC | PRN
  Administered 2015-08-05 – 2015-08-06 (×3): 650 mg via ORAL
  Filled 2015-08-05 (×3): qty 2

## 2015-08-05 MED ORDER — NITROGLYCERIN 0.4 MG SL SUBL: 0.4000 mg | SUBLINGUAL_TABLET | SUBLINGUAL | Status: DC | PRN

## 2015-08-05 MED ORDER — SODIUM CHLORIDE 0.9 % IV SOLN
8.0000 mg | Freq: Four times a day (QID) | INTRAVENOUS | Status: DC | PRN
  Administered 2015-08-05: 8 mg via INTRAVENOUS
  Filled 2015-08-05 (×2): qty 4

## 2015-08-05 MED ORDER — FENTANYL CITRATE (PF) 100 MCG/2ML IJ SOLN
INTRAMUSCULAR | Status: AC
  Filled 2015-08-05: qty 2

## 2015-08-05 MED ORDER — ONDANSETRON HCL 4 MG/2ML IJ SOLN
4.0000 mg | Freq: Four times a day (QID) | INTRAMUSCULAR | Status: DC | PRN
  Administered 2015-08-05: 4 mg via INTRAVENOUS
  Filled 2015-08-05: qty 2

## 2015-08-05 MED ORDER — CLOBETASOL PROPIONATE 0.05 % EX CREA
1.0000 "application " | TOPICAL_CREAM | CUTANEOUS | Status: DC
  Administered 2015-08-06: 1 via TOPICAL
  Filled 2015-08-05: qty 15

## 2015-08-05 NOTE — ED Notes (Signed)
Patient unable to get comfortable  Lying in the bed and will "jump" at times.  Continues to c/o chest pain that travels up to her left shoulder and neck.  Continues to dry heave and will "spit" out moderate amt of thick sputum occasionally becomes yellow tinged.  Patient and daughter and son states that the pain started after eating bar b que chicken, roll and apple pie.

## 2015-08-05 NOTE — Progress Notes (Signed)
Pt tolerating clear liquids without any further n/v. Will cont to monitor.

## 2015-08-05 NOTE — ED Notes (Signed)
PA at bedside.

## 2015-08-05 NOTE — Progress Notes (Signed)
08/05/2015 1030 Pt. With continued complaints of nausea. Noted MD increasing Zofran to 8 mg IV q 6 h prn, however, last dose administered at 0630. Verbal order Dr. Olevia Bowens ok to administer increased dose now due to pt. Continued symptoms. Orders enacted. Pt. Updated on plan of care. Will continue to closely monitor patient. Abiageal Blowe, Arville Lime

## 2015-08-05 NOTE — Progress Notes (Signed)
Pt nausea much improved and pt is resting comfortably. VSS. Pt NPO but if able to tolerate po's later will pg MD and try clear liquids.  MD Olevia Bowens pg'd about troponin 0.05. Will continue to monitor.  Darel Hong M

## 2015-08-05 NOTE — H&P (Signed)
[facility] History and Physical  SHERRE WOOTON LPF:[id number] DOB: [birth year] DOA: 08/04/2015  Referring physician: Dr. Leonides Schanz. PCP: Horatio Pel, MD  Specialists: None.  Chief Complaint: Chest pain.  HPI: [name] is a 81 y.o. female with history of hypertension and osteoarthritis presents to the ER from the nursing home after patient was complaining of chest pain. Patient's symptoms started last night around 9 PM with persistent chest pain which was retrosternal radiating to the back. Patient also has his pain increases on deep breathing. CT available chest and abdomen was unremarkable except for showing large hiatal hernia with almost whole of the stomach in it. Cardiac markers were negative EKG was unremarkable and patient will be admitted for further observation of chest pain. Patient states she has been having some nausea but denies any vomiting or diarrhea.   Review of Systems: As presented in the history of presenting illness, rest negative.  Past Medical History  Diagnosis Date  . Hyperlipidemia   . Mitral regurgitation     mild by echo 2003  . Allergic rhinitis   . Actinic keratosis   . Tenosynovitis     myxomatous  . Chronic constipation   . GERD (gastroesophageal reflux disease)   . Osteoarthritis   . Back pain   . Macular degeneration   . Hypertension   . Osteoarthrosis   . Osteoporosis   . Hyponatremia   . Leg edema    Past Surgical History  Procedure Laterality Date  . Synovectomy      of right long finger  . Hernial repain    . Inguinal hernia repair      laparoscopic preperitoneal repair of bilateral inguinal hernias  . Carpal tunnel release      bilateral  . Replacement total knee bilateral    . Tonisellectomy    . Tonisillectomy    . Tonsillectomy    . Reverse shoulder arthroplasty Right 05/08/2014    Procedure: REVERSE SHOULDER ARTHROPLASTY RIGHT ;  Surgeon: Marin Shutter, MD;  Location: [facility];  Service: Orthopedics;   Laterality: Right;   Social History:  reports that she has quit smoking. Her smoking use included Cigarettes. She has a 2 pack-year smoking history. She does not have any smokeless tobacco history on file. She reports that she drinks about 1.2 oz of alcohol per week. She reports that she does not use illicit drugs. Where does patient live nursing home. Can patient participate in ADLs? Yes.  Allergies  Allergen Reactions  . Aspirin     Unknown  . Boniva [Ibandronic Acid]     Unknown  . Morphine     Unknown  . Nitrofuran Derivatives     Unknown  . Penicillins     Unknown reaction . Received Ancef without problem   . Sulfonamide Derivatives     Unknown    Family History:  Family History  Problem Relation Age of Onset  . Heart attack Father   . Heart attack Brother       Prior to Admission medications   Medication Sig Start Date End Date Taking? Authorizing Provider  bisacodyl (DULCOLAX) 5 MG EC tablet Take 1 tablet (5 mg total) by mouth daily as needed for mild constipation or moderate constipation. 05/10/14  Yes Orson Eva, MD  calcium citrate-vitamin D (CITRACAL+D) 315-200 MG-UNIT per tablet Take 1 tablet by mouth daily.    Yes Historical Provider, MD  cetirizine (ZYRTEC) 10 MG tablet Take 15 mg by mouth daily.  Yes Historical Provider, MD  clobetasol cream (TEMOVATE) 9.60 % Apply 1 application topically 2 (two) times a week.   Yes Historical Provider, MD  conjugated estrogens (PREMARIN) vaginal cream Place 1 Applicatorful vaginally 3 (three) times a week.   Yes Historical Provider, MD  diclofenac sodium (VOLTAREN) 1 % GEL Apply 2 g topically 2 (two) times daily as needed (pain).    Yes Historical Provider, MD  irbesartan (AVAPRO) 75 MG tablet Take 1 tablet (75 mg total) by mouth daily. 05/10/14  Yes Orson Eva, MD  Multiple Vitamin (MULTIVITAMIN) capsule Take 1 capsule by mouth daily.    Yes Historical Provider, MD  Multiple Vitamins-Minerals (PRESERVISION AREDS 2 PO) Take 1  tablet by mouth 2 (two) times daily.    Yes Historical Provider, MD  oxyCODONE-acetaminophen (PERCOCET/ROXICET) 5-325 MG per tablet Take 1-2 tablets by mouth every 4 (four) hours as needed for severe pain. 05/10/14  Yes Orson Eva, MD  polyethylene glycol (MIRALAX / GLYCOLAX) packet Take 17 g by mouth daily as needed for mild constipation. 05/10/14  Yes Orson Eva, MD  PRESCRIPTION MEDICATION Apply topically daily as needed (vaginal yeast infection.).    Yes Historical Provider, MD  PRESCRIPTION MEDICATION Place 1 application around the anus daily as needed (itching).   Yes Historical Provider, MD  Probiotic Product ([place]) Take 1 tablet by mouth daily.   Yes Historical Provider, MD  Ranibizumab (LUCENTIS IO) Inject into the eye every 8 (eight) weeks. Injection in right eye   Yes Historical Provider, MD  trimethoprim (TRIMPEX) 100 MG tablet Take 100 mg by mouth daily. UTI prophylaxis.   Yes Historical Provider, MD  zoledronic acid (RECLAST) 5 MG/100ML SOLN injection Inject 5 mg into the vein. Once annually.   Yes Historical Provider, MD  promethazine (PHENERGAN) 12.5 MG tablet Take 1-2 tablets (12.5-25 mg total) by mouth every 6 (six) hours as needed for nausea. 07/01/12 07/08/12  Danae Orleans, PA-C    Physical Exam: Filed Vitals:   08/05/15 0315 08/05/15 0345 08/05/15 0415 08/05/15 0531  BP: 154/85 166/86 158/80 153/72  Pulse: 99 97 91 96  Temp:    98.1 F (36.7 C)  TempSrc:    Oral  Resp: 20 15 22 20   SpO2: 94% 100% 100% 96%     General:  Moderately built and nourished.  Eyes: Anicteric no pallor.  ENT: No discharge from the ears eyes nose and mouth.  Neck: No mass felt.  Cardiovascular: S1 and S2 heard.  Respiratory: No rhonchi or crepitations.  Abdomen: Soft nontender bowel sounds present.  Skin: No rash.  Musculoskeletal: No edema.  Psychiatric: Appears normal.  Neurologic: Alert awake oriented to time place and person. Moves all extremities.  Labs on  Admission:  Basic Metabolic Panel:  Recent Labs Lab 08/05/15 0012 08/05/15 0125  NA 134*  --   K 3.8  --   CL 97*  --   CO2 27  --   GLUCOSE 149*  --   BUN 15  --   CREATININE 0.66 0.70  CALCIUM 9.3  --    Liver Function Tests:  Recent Labs Lab 08/05/15 0012  AST 31  ALT 19  ALKPHOS 71  BILITOT 0.4  PROT 6.8  ALBUMIN 4.0   No results for input(s): LIPASE, AMYLASE in the last 168 hours. No results for input(s): AMMONIA in the last 168 hours. CBC:  Recent Labs Lab 08/05/15 0012  WBC 11.3*  NEUTROABS 9.1*  HGB 12.6  HCT 37.4  MCV  91.7  PLT 300   Cardiac Enzymes: No results for input(s): CKTOTAL, CKMB, CKMBINDEX, TROPONINI in the last 168 hours.  BNP (last 3 results) No results for input(s): BNP in the last 8760 hours.  ProBNP (last 3 results) No results for input(s): PROBNP in the last 8760 hours.  CBG: No results for input(s): GLUCAP in the last 168 hours.  Radiological Exams on Admission: Dg Chest Port 1 View  08/05/2015   CLINICAL DATA:  Chest pain and shortness of breath tonight.  EXAM: PORTABLE CHEST - 1 VIEW  COMPARISON:  05/05/2014  FINDINGS: Shallow inspiration. Heart size and pulmonary vascularity are normal for technique. Large esophageal hiatal hernia behind the heart. Linear atelectasis in the lung bases. No focal consolidation. No blunting of costophrenic angles. No pneumothorax. Calcification of the aorta. Thoracic scoliosis convex towards the right. Postoperative changes in the right shoulder. Degenerative changes in the left shoulder.  IMPRESSION: No evidence of active pulmonary disease. Esophageal hiatal hernia behind the heart.   Electronically Signed   By: Lucienne Capers M.D.   On: 08/05/2015 01:32   Ct Angio Chest Aorta W/cm &/or Wo/cm  08/05/2015   CLINICAL DATA:  Chest pain and upper back pain.  EXAM: CT ANGIOGRAPHY CHEST, ABDOMEN AND PELVIS  TECHNIQUE: Multidetector CT imaging through the chest, abdomen and pelvis was performed using the  standard protocol during bolus administration of intravenous contrast. Multiplanar reconstructed images and MIPs were obtained and reviewed to evaluate the vascular anatomy.  CONTRAST:  183mL OMNIPAQUE IOHEXOL 350 MG/ML SOLN  COMPARISON:  None.  FINDINGS: CTA CHEST FINDINGS  Noncontrast axial images are obtained. Examination is limited due to motion artifact. Calcification of the thoracic aorta. Calcification of coronary arteries. No aortic dilatation. No evidence of intramural hematoma.  Subsequent images obtained during arterial phase of contrast administration. Heart size is normal. Normal caliber thoracic aorta. No evidence of aortic dissection. Great vessel origins are patent. Motion artifact limits examination. There is a large esophageal hiatal hernia with a large amount of fluid-filled stomach present in the left lower chest. Esophagus is mildly dilated with fluid in the esophagus possibly due to reflux. This does not appear to represent gastric volvulus.  Atelectasis or consolidation in the left lung base. Slight fibrosis in the lungs. No pleural effusions. No pneumothorax. Postoperative changes in the right shoulder.  Review of the MIP images confirms the above findings.  CTA ABDOMEN AND PELVIS FINDINGS  Tortuous abdominal aorta with scattered calcifications. Calcification in the major vessel origins. No evidence of aortic dissection or aneurysm. The abdominal aorta, celiac axis, superior mesenteric artery, single bilateral renal arteries, inferior mesenteric artery, and bilateral iliac, external iliac, internal iliac, and common femoral arteries are patent.  The liver, spleen, gallbladder, pancreas, adrenal glands, kidneys, inferior vena cava, and retroperitoneal lymph nodes are unremarkable. Small bowel and colon are not abnormally distended. No free air or free fluid in the abdomen.  Pelvis: Appendix is normal. Bladder wall is not thickened. No pelvic mass or lymphadenopathy. Focal loculated fluid  collection in the left pelvis probably represents a bladder diverticulum although visualization is limited due to streak artifact from left hip arthroplasty. Postoperative changes consistent with mesh repair of the anterior abdominal wall. No residual or recurrent hernia is identified.  Bones: Diffuse bone demineralization. Thoracolumbar scoliosis with prominent degenerative changes throughout. No destructive bone lesions.  Review of the MIP images confirms the above findings.  IMPRESSION: No evidence of dissection of the thoracic or of the abdominal aorta. No aneurysm  or significant vascular occlusion.  Large hiatal hernia with most of the stomach in the chest. Stomach is fluid filled with possible reflux into the esophagus. No definite finding of obstruction or volvulus. Atelectasis or consolidation in the left lung base. Probable bladder diverticulum in the pelvis.   Electronically Signed   By: Lucienne Capers M.D.   On: 08/05/2015 03:07   Ct Cta Abd/pel W/cm &/or W/o Cm  08/05/2015   CLINICAL DATA:  Chest pain and upper back pain.  EXAM: CT ANGIOGRAPHY CHEST, ABDOMEN AND PELVIS  TECHNIQUE: Multidetector CT imaging through the chest, abdomen and pelvis was performed using the standard protocol during bolus administration of intravenous contrast. Multiplanar reconstructed images and MIPs were obtained and reviewed to evaluate the vascular anatomy.  CONTRAST:  174mL OMNIPAQUE IOHEXOL 350 MG/ML SOLN  COMPARISON:  None.  FINDINGS: CTA CHEST FINDINGS  Noncontrast axial images are obtained. Examination is limited due to motion artifact. Calcification of the thoracic aorta. Calcification of coronary arteries. No aortic dilatation. No evidence of intramural hematoma.  Subsequent images obtained during arterial phase of contrast administration. Heart size is normal. Normal caliber thoracic aorta. No evidence of aortic dissection. Great vessel origins are patent. Motion artifact limits examination. There is a large  esophageal hiatal hernia with a large amount of fluid-filled stomach present in the left lower chest. Esophagus is mildly dilated with fluid in the esophagus possibly due to reflux. This does not appear to represent gastric volvulus.  Atelectasis or consolidation in the left lung base. Slight fibrosis in the lungs. No pleural effusions. No pneumothorax. Postoperative changes in the right shoulder.  Review of the MIP images confirms the above findings.  CTA ABDOMEN AND PELVIS FINDINGS  Tortuous abdominal aorta with scattered calcifications. Calcification in the major vessel origins. No evidence of aortic dissection or aneurysm. The abdominal aorta, celiac axis, superior mesenteric artery, single bilateral renal arteries, inferior mesenteric artery, and bilateral iliac, external iliac, internal iliac, and common femoral arteries are patent.  The liver, spleen, gallbladder, pancreas, adrenal glands, kidneys, inferior vena cava, and retroperitoneal lymph nodes are unremarkable. Small bowel and colon are not abnormally distended. No free air or free fluid in the abdomen.  Pelvis: Appendix is normal. Bladder wall is not thickened. No pelvic mass or lymphadenopathy. Focal loculated fluid collection in the left pelvis probably represents a bladder diverticulum although visualization is limited due to streak artifact from left hip arthroplasty. Postoperative changes consistent with mesh repair of the anterior abdominal wall. No residual or recurrent hernia is identified.  Bones: Diffuse bone demineralization. Thoracolumbar scoliosis with prominent degenerative changes throughout. No destructive bone lesions.  Review of the MIP images confirms the above findings.  IMPRESSION: No evidence of dissection of the thoracic or of the abdominal aorta. No aneurysm or significant vascular occlusion.  Large hiatal hernia with most of the stomach in the chest. Stomach is fluid filled with possible reflux into the esophagus. No definite  finding of obstruction or volvulus. Atelectasis or consolidation in the left lung base. Probable bladder diverticulum in the pelvis.   Electronically Signed   By: Lucienne Capers M.D.   On: 08/05/2015 03:07    EKG: Independently reviewed. Normal sinus rhythm.  Assessment/Plan Principal Problem:   Chest pain Active Problems:   Osteoarthritis   Essential hypertension, benign   1. Chest pain - has both atypical and typical symptoms. Patient has painful also be from large hiatal hernia with almost whole stomach in it. At this time we will cycle  cardiac markers given the risk factors. I have also consult cardiology. I have placed patient on Protonix. Check lipase. Patient is allergic to aspirin so I have placed patient on Plavix. When necessary fentanyl and nitroglycerin. 2. Hypertension - continue present medications. 3. Osteoarthritis.  I have reviewed patient's old charts of labs.   DVT Prophylaxis Lovenox.  Code Status: DO NOT RESUSCITATE.  Family Communication: Discussed with patient.  Disposition Plan: Admit for observation.    KAKRAKANDY,ARSHAD N. [facility] Pager [phone].  If 7PM-7AM, please contact night-coverage [email or web address] Password [email or web address] 08/05/2015, 6:01 AM

## 2015-08-05 NOTE — ED Provider Notes (Signed)
  Medical screening examination/treatment/procedure(s) were conducted as a shared visit with non-physician practitioner(s) and myself.  I personally evaluated the patient during the encounter.   EKG Interpretation   Date/Time:  Sunday August 04 2015 23:54:55 EDT Ventricular Rate:  98 PR Interval:  208 QRS Duration: 89 QT Interval:  374 QTC Calculation: 477 R Axis:   -47 Text Interpretation:  Sinus rhythm Borderline prolonged PR interval  Probable left atrial enlargement Left anterior fascicular block Low  voltage, precordial leads Abnormal R-wave progression, early transition  Consider anterior infarct No significant change since last tracing  Confirmed by WARD,  DO, KRISTEN [phone]) on 08/05/2015 12:14:30 AM         EKG Interpretation  Date/Time:  Monday August 05 2015 01:10:49 EDT Ventricular Rate:  89 PR Interval:  189 QRS Duration: 88 QT Interval:  368 QTC Calculation: 448 R Axis:   -45 Text Interpretation:  Sinus rhythm Ventricular premature complex Probable left atrial enlargement Left anterior fascicular block Low voltage, precordial leads Abnormal R-wave progression, early transition Consider anterior infarct No significant change since last tracing Confirmed by WARD,  DO, KRISTEN [phone]) on 08/05/2015 3:16:26 AM         HPI Comments:  [name] is a 81 y.o. female with a PMHx hyperlipidemia, mitral regurgitation, and HTN who presents to the Emergency Department complaining of constant, ongoing chest pain that radiates into her back. Denies any aggravating or alleviating factors at this time. Pain initially started at 8:30-9:00 PM yesterday evening.  Denies any relief since onset. Family admits to a history of blood clot to the knee several years ago. Has associated shortness of breath, nausea and vomiting. Appears very uncomfortable on exam. Actively vomiting. Her lung sounds normal. Abdomen soft. EKG shows no new ischemic changes and initial troponin negative. CT  scan shows no dissection or aneurysm. She has a large hiatal hernia. Will admit for chest pain rule out.  [place], DO 08/05/15 [phone]

## 2015-08-05 NOTE — Consult Note (Signed)
CARDIOLOGY CONSULT NOTE       Patient ID: [name] MRN: [id number] DOB/AGE: 02/11/1926 81 y.o.  Admit date: 08/04/2015 Referring Physician: Olevia Bowens Primary Physician: Horatio Pel, MD Primary Cardiologist:  New Reason for Consultation: Chest Pain  Principal Problem:   Chest pain Active Problems:   Osteoarthritis   Essential hypertension, benign   HPI:  81 y.o. admitted with constant epigastric and retrosternal chest pain since 9:00pm.  Has r/o and has no acute ECG changes No previous history of CAD.  Pain has GI overtones. Large hiatal hernia on CT with no PE or other chest abnormalities.  Pain worse with drinking water and associated with nausea and feeling like she is going to vomit .  She is DNR and came from nursing home  Telemetry is benign NSR.  No ECG changes compared to 2013 with just poor R wave progression  ROS All other systems reviewed and negative except as noted above  Past Medical History  Diagnosis Date  . Hyperlipidemia   . Mitral regurgitation     mild by echo 2003  . Allergic rhinitis   . Actinic keratosis   . Tenosynovitis     myxomatous  . Chronic constipation   . GERD (gastroesophageal reflux disease)   . Osteoarthritis   . Back pain   . Macular degeneration   . Hypertension   . Osteoarthrosis   . Osteoporosis   . Hyponatremia   . Leg edema     Family History  Problem Relation Age of Onset  . Heart attack Father   . Heart attack Brother     Social History   Social History  . Marital Status: Divorced    Spouse Name: N/A  . Number of Children: 4  . Years of Education: N/A   Occupational History  . Not on file.   Social History Main Topics  . Smoking status: Former Smoker -- 1.00 packs/day for 2 years    Types: Cigarettes  . Smokeless tobacco: Not on file  . Alcohol Use: 1.2 oz/week    2 Glasses of wine per week     Comment: 2 glasses per week  . Drug Use: No  . Sexual Activity: No   Other Topics Concern  . Not  on file   Social History Narrative    Past Surgical History  Procedure Laterality Date  . Synovectomy      of right long finger  . Hernial repain    . Inguinal hernia repair      laparoscopic preperitoneal repair of bilateral inguinal hernias  . Carpal tunnel release      bilateral  . Replacement total knee bilateral    . Tonisellectomy    . Tonisillectomy    . Tonsillectomy    . Reverse shoulder arthroplasty Right 05/08/2014    Procedure: REVERSE SHOULDER ARTHROPLASTY RIGHT ;  Surgeon: Marin Shutter, MD;  Location: [facility];  Service: Orthopedics;  Laterality: Right;     . calcium-vitamin D  1 tablet Oral Q breakfast  . [START ON 08/06/2015] clobetasol cream  1 application Topical Once per day on Tue Fri  . clopidogrel  75 mg Oral Daily  . enoxaparin (LOVENOX) injection  40 mg Subcutaneous Daily  . irbesartan  75 mg Oral Daily  . loratadine  10 mg Oral Daily  . pantoprazole (PROTONIX) IV  40 mg Intravenous Q12H  . trimethoprim  100 mg Oral Daily      Physical Exam: Blood pressure 153/72, pulse 96,  temperature 98.1 F (36.7 C), temperature source Oral, resp. rate 20, SpO2 96 %.    Affect appropriate Elderly white female  HEENT: normal Neck supple with no adenopathy JVP normal no bruits no thyromegaly Lungs clear with no wheezing and good diaphragmatic motion Heart:  S1/S2 no murmur, no rub, gallop or click PMI normal Abdomen: benighn, BS positve, no tenderness, no AAA no bruit.  No HSM or HJR Distal pulses intact with no bruits No edema Neuro non-focal Skin warm and dry No muscular weakness   Labs:   Lab Results  Component Value Date   WBC 13.6* 08/05/2015   HGB 12.6 08/05/2015   HCT 36.8 08/05/2015   MCV 90.2 08/05/2015   PLT 286 08/05/2015    Recent Labs Lab 08/05/15 0012  08/05/15 0650  NA 134*  --   --   K 3.8  --   --   CL 97*  --   --   CO2 27  --   --   BUN 15  --   --   CREATININE 0.66  < > 0.69  CALCIUM 9.3  --   --   PROT 6.8  --   --     BILITOT 0.4  --   --   ALKPHOS 71  --   --   ALT 19  --   --   AST 31  --   --   GLUCOSE 149*  --   --   < > = values in this interval not displayed. Lab Results  Component Value Date   TROPONINI <0.03 08/05/2015     Radiology: Dg Chest Port 1 View  08/05/2015   CLINICAL DATA:  Chest pain and shortness of breath tonight.  EXAM: PORTABLE CHEST - 1 VIEW  COMPARISON:  05/05/2014  FINDINGS: Shallow inspiration. Heart size and pulmonary vascularity are normal for technique. Large esophageal hiatal hernia behind the heart. Linear atelectasis in the lung bases. No focal consolidation. No blunting of costophrenic angles. No pneumothorax. Calcification of the aorta. Thoracic scoliosis convex towards the right. Postoperative changes in the right shoulder. Degenerative changes in the left shoulder.  IMPRESSION: No evidence of active pulmonary disease. Esophageal hiatal hernia behind the heart.   Electronically Signed   By: Lucienne Capers M.D.   On: 08/05/2015 01:32   Ct Angio Chest Aorta W/cm &/or Wo/cm  08/05/2015   CLINICAL DATA:  Chest pain and upper back pain.  EXAM: CT ANGIOGRAPHY CHEST, ABDOMEN AND PELVIS  TECHNIQUE: Multidetector CT imaging through the chest, abdomen and pelvis was performed using the standard protocol during bolus administration of intravenous contrast. Multiplanar reconstructed images and MIPs were obtained and reviewed to evaluate the vascular anatomy.  CONTRAST:  154mL OMNIPAQUE IOHEXOL 350 MG/ML SOLN  COMPARISON:  None.  FINDINGS: CTA CHEST FINDINGS  Noncontrast axial images are obtained. Examination is limited due to motion artifact. Calcification of the thoracic aorta. Calcification of coronary arteries. No aortic dilatation. No evidence of intramural hematoma.  Subsequent images obtained during arterial phase of contrast administration. Heart size is normal. Normal caliber thoracic aorta. No evidence of aortic dissection. Great vessel origins are patent. Motion artifact limits  examination. There is a large esophageal hiatal hernia with a large amount of fluid-filled stomach present in the left lower chest. Esophagus is mildly dilated with fluid in the esophagus possibly due to reflux. This does not appear to represent gastric volvulus.  Atelectasis or consolidation in the left lung base. Slight fibrosis in the lungs.  No pleural effusions. No pneumothorax. Postoperative changes in the right shoulder.  Review of the MIP images confirms the above findings.  CTA ABDOMEN AND PELVIS FINDINGS  Tortuous abdominal aorta with scattered calcifications. Calcification in the major vessel origins. No evidence of aortic dissection or aneurysm. The abdominal aorta, celiac axis, superior mesenteric artery, single bilateral renal arteries, inferior mesenteric artery, and bilateral iliac, external iliac, internal iliac, and common femoral arteries are patent.  The liver, spleen, gallbladder, pancreas, adrenal glands, kidneys, inferior vena cava, and retroperitoneal lymph nodes are unremarkable. Small bowel and colon are not abnormally distended. No free air or free fluid in the abdomen.  Pelvis: Appendix is normal. Bladder wall is not thickened. No pelvic mass or lymphadenopathy. Focal loculated fluid collection in the left pelvis probably represents a bladder diverticulum although visualization is limited due to streak artifact from left hip arthroplasty. Postoperative changes consistent with mesh repair of the anterior abdominal wall. No residual or recurrent hernia is identified.  Bones: Diffuse bone demineralization. Thoracolumbar scoliosis with prominent degenerative changes throughout. No destructive bone lesions.  Review of the MIP images confirms the above findings.  IMPRESSION: No evidence of dissection of the thoracic or of the abdominal aorta. No aneurysm or significant vascular occlusion.  Large hiatal hernia with most of the stomach in the chest. Stomach is fluid filled with possible reflux  into the esophagus. No definite finding of obstruction or volvulus. Atelectasis or consolidation in the left lung base. Probable bladder diverticulum in the pelvis.   Electronically Signed   By: Lucienne Capers M.D.   On: 08/05/2015 03:07   Ct Cta Abd/pel W/cm &/or W/o Cm  08/05/2015   CLINICAL DATA:  Chest pain and upper back pain.  EXAM: CT ANGIOGRAPHY CHEST, ABDOMEN AND PELVIS  TECHNIQUE: Multidetector CT imaging through the chest, abdomen and pelvis was performed using the standard protocol during bolus administration of intravenous contrast. Multiplanar reconstructed images and MIPs were obtained and reviewed to evaluate the vascular anatomy.  CONTRAST:  13mL OMNIPAQUE IOHEXOL 350 MG/ML SOLN  COMPARISON:  None.  FINDINGS: CTA CHEST FINDINGS  Noncontrast axial images are obtained. Examination is limited due to motion artifact. Calcification of the thoracic aorta. Calcification of coronary arteries. No aortic dilatation. No evidence of intramural hematoma.  Subsequent images obtained during arterial phase of contrast administration. Heart size is normal. Normal caliber thoracic aorta. No evidence of aortic dissection. Great vessel origins are patent. Motion artifact limits examination. There is a large esophageal hiatal hernia with a large amount of fluid-filled stomach present in the left lower chest. Esophagus is mildly dilated with fluid in the esophagus possibly due to reflux. This does not appear to represent gastric volvulus.  Atelectasis or consolidation in the left lung base. Slight fibrosis in the lungs. No pleural effusions. No pneumothorax. Postoperative changes in the right shoulder.  Review of the MIP images confirms the above findings.  CTA ABDOMEN AND PELVIS FINDINGS  Tortuous abdominal aorta with scattered calcifications. Calcification in the major vessel origins. No evidence of aortic dissection or aneurysm. The abdominal aorta, celiac axis, superior mesenteric artery, single bilateral renal  arteries, inferior mesenteric artery, and bilateral iliac, external iliac, internal iliac, and common femoral arteries are patent.  The liver, spleen, gallbladder, pancreas, adrenal glands, kidneys, inferior vena cava, and retroperitoneal lymph nodes are unremarkable. Small bowel and colon are not abnormally distended. No free air or free fluid in the abdomen.  Pelvis: Appendix is normal. Bladder wall is not thickened. No pelvic mass  or lymphadenopathy. Focal loculated fluid collection in the left pelvis probably represents a bladder diverticulum although visualization is limited due to streak artifact from left hip arthroplasty. Postoperative changes consistent with mesh repair of the anterior abdominal wall. No residual or recurrent hernia is identified.  Bones: Diffuse bone demineralization. Thoracolumbar scoliosis with prominent degenerative changes throughout. No destructive bone lesions.  Review of the MIP images confirms the above findings.  IMPRESSION: No evidence of dissection of the thoracic or of the abdominal aorta. No aneurysm or significant vascular occlusion.  Large hiatal hernia with most of the stomach in the chest. Stomach is fluid filled with possible reflux into the esophagus. No definite finding of obstruction or volvulus. Atelectasis or consolidation in the left lung base. Probable bladder diverticulum in the pelvis.   Electronically Signed   By: Lucienne Capers M.D.   On: 08/05/2015 03:07    EKG: SR poor R wave progression no acute ST changes no change form 2013   ASSESSMENT AND PLAN:  Chest Pain: Appears non cardia.  Prolonged with negative enzymes.  Likely related to large hiatal hernia.  Given age and  DNR status no further w/u indicated.  Rx stomach issues with zofran, protonix consider GI consult.    Hitatal  Hernia:  Elevated head of bed.  Consider liquid diet until symptoms improve.  Zofran and protonix.     SignedJenkins Rouge 08/05/2015, 7:41 AM

## 2015-08-05 NOTE — Progress Notes (Addendum)
[facility] PROGRESS NOTE  Assessment/Plan: Atypical Chest pain likely due to esophagitis - Cardiac biomarkers negative 3, there is likely due to esophagitis she continues to vomit. - Continue Protonix and Carafate. - Cardiology was consulted and recommended no further workup. - To her age unless she is having a major GI bleed unlikely GI will proceed with any further procedures. - Continue IV hydration. - Leukocytosis likely stressed emargination. Repeat a CBC in the morning. She has remained afebrile.  Essential hypertension, benign Blood pressure seems siblings living, her blood pressure will fluctuate vomiting.    Code Status: full Family Communication: none  Disposition Plan: observation   Consultants:  Cardio  Procedures:  Ct angio  Ct abd and pelvis  Antibiotics:  None  HPI/Subjective: Now with abd pain  Objective: Filed Vitals:   08/05/15 0315 08/05/15 0345 08/05/15 0415 08/05/15 0531  BP: 154/85 166/86 158/80 153/72  Pulse: 99 97 91 96  Temp:    98.1 F (36.7 C)  TempSrc:    Oral  Resp: 20 15 22 20   SpO2: 94% 100% 100% 96%   No intake or output data in the 24 hours ending 08/05/15 0800 There were no vitals filed for this visit.  Exam:  General: Alert, awake, oriented x3, in no acute distress.  HEENT: No bruits, no goiter.  Heart: Regular rate and rhythm. Lungs: Good air movement, clear Abdomen: Soft, nontender, nondistended, positive bowel sounds.  Neuro: Grossly intact, nonfocal.   Data Reviewed: Basic Metabolic Panel:  Recent Labs Lab 08/05/15 0012 08/05/15 0125 08/05/15 0650  NA 134*  --   --   K 3.8  --   --   CL 97*  --   --   CO2 27  --   --   GLUCOSE 149*  --   --   BUN 15  --   --   CREATININE 0.66 0.70 0.69  CALCIUM 9.3  --   --    Liver Function Tests:  Recent Labs Lab 08/05/15 0012  AST 31  ALT 19  ALKPHOS 71  BILITOT 0.4  PROT 6.8  ALBUMIN 4.0    Recent Labs Lab 08/05/15 0650  LIPASE 23    No results for input(s): AMMONIA in the last 168 hours. CBC:  Recent Labs Lab 08/05/15 0012 08/05/15 0650  WBC 11.3* 13.6*  NEUTROABS 9.1*  --   HGB 12.6 12.6  HCT 37.4 36.8  MCV 91.7 90.2  PLT 300 286   Cardiac Enzymes:  Recent Labs Lab 08/05/15 0650  TROPONINI <0.03   BNP (last 3 results) No results for input(s): BNP in the last 8760 hours.  ProBNP (last 3 results) No results for input(s): PROBNP in the last 8760 hours.  CBG: No results for input(s): GLUCAP in the last 168 hours.  No results found for this or any previous visit (from the past 240 hour(s)).   Studies: Dg Chest Port 1 View  08/05/2015   CLINICAL DATA:  Chest pain and shortness of breath tonight.  EXAM: PORTABLE CHEST - 1 VIEW  COMPARISON:  05/05/2014  FINDINGS: Shallow inspiration. Heart size and pulmonary vascularity are normal for technique. Large esophageal hiatal hernia behind the heart. Linear atelectasis in the lung bases. No focal consolidation. No blunting of costophrenic angles. No pneumothorax. Calcification of the aorta. Thoracic scoliosis convex towards the right. Postoperative changes in the right shoulder. Degenerative changes in the left shoulder.  IMPRESSION: No evidence of active pulmonary disease. Esophageal hiatal hernia behind the heart.  Electronically Signed   By: Lucienne Capers M.D.   On: 08/05/2015 01:32   Ct Angio Chest Aorta W/cm &/or Wo/cm  08/05/2015   CLINICAL DATA:  Chest pain and upper back pain.  EXAM: CT ANGIOGRAPHY CHEST, ABDOMEN AND PELVIS  TECHNIQUE: Multidetector CT imaging through the chest, abdomen and pelvis was performed using the standard protocol during bolus administration of intravenous contrast. Multiplanar reconstructed images and MIPs were obtained and reviewed to evaluate the vascular anatomy.  CONTRAST:  126mL OMNIPAQUE IOHEXOL 350 MG/ML SOLN  COMPARISON:  None.  FINDINGS: CTA CHEST FINDINGS  Noncontrast axial images are obtained. Examination is limited due  to motion artifact. Calcification of the thoracic aorta. Calcification of coronary arteries. No aortic dilatation. No evidence of intramural hematoma.  Subsequent images obtained during arterial phase of contrast administration. Heart size is normal. Normal caliber thoracic aorta. No evidence of aortic dissection. Great vessel origins are patent. Motion artifact limits examination. There is a large esophageal hiatal hernia with a large amount of fluid-filled stomach present in the left lower chest. Esophagus is mildly dilated with fluid in the esophagus possibly due to reflux. This does not appear to represent gastric volvulus.  Atelectasis or consolidation in the left lung base. Slight fibrosis in the lungs. No pleural effusions. No pneumothorax. Postoperative changes in the right shoulder.  Review of the MIP images confirms the above findings.  CTA ABDOMEN AND PELVIS FINDINGS  Tortuous abdominal aorta with scattered calcifications. Calcification in the major vessel origins. No evidence of aortic dissection or aneurysm. The abdominal aorta, celiac axis, superior mesenteric artery, single bilateral renal arteries, inferior mesenteric artery, and bilateral iliac, external iliac, internal iliac, and common femoral arteries are patent.  The liver, spleen, gallbladder, pancreas, adrenal glands, kidneys, inferior vena cava, and retroperitoneal lymph nodes are unremarkable. Small bowel and colon are not abnormally distended. No free air or free fluid in the abdomen.  Pelvis: Appendix is normal. Bladder wall is not thickened. No pelvic mass or lymphadenopathy. Focal loculated fluid collection in the left pelvis probably represents a bladder diverticulum although visualization is limited due to streak artifact from left hip arthroplasty. Postoperative changes consistent with mesh repair of the anterior abdominal wall. No residual or recurrent hernia is identified.  Bones: Diffuse bone demineralization. Thoracolumbar  scoliosis with prominent degenerative changes throughout. No destructive bone lesions.  Review of the MIP images confirms the above findings.  IMPRESSION: No evidence of dissection of the thoracic or of the abdominal aorta. No aneurysm or significant vascular occlusion.  Large hiatal hernia with most of the stomach in the chest. Stomach is fluid filled with possible reflux into the esophagus. No definite finding of obstruction or volvulus. Atelectasis or consolidation in the left lung base. Probable bladder diverticulum in the pelvis.   Electronically Signed   By: Lucienne Capers M.D.   On: 08/05/2015 03:07   Ct Cta Abd/pel W/cm &/or W/o Cm  08/05/2015   CLINICAL DATA:  Chest pain and upper back pain.  EXAM: CT ANGIOGRAPHY CHEST, ABDOMEN AND PELVIS  TECHNIQUE: Multidetector CT imaging through the chest, abdomen and pelvis was performed using the standard protocol during bolus administration of intravenous contrast. Multiplanar reconstructed images and MIPs were obtained and reviewed to evaluate the vascular anatomy.  CONTRAST:  157mL OMNIPAQUE IOHEXOL 350 MG/ML SOLN  COMPARISON:  None.  FINDINGS: CTA CHEST FINDINGS  Noncontrast axial images are obtained. Examination is limited due to motion artifact. Calcification of the thoracic aorta. Calcification of coronary arteries. No aortic  dilatation. No evidence of intramural hematoma.  Subsequent images obtained during arterial phase of contrast administration. Heart size is normal. Normal caliber thoracic aorta. No evidence of aortic dissection. Great vessel origins are patent. Motion artifact limits examination. There is a large esophageal hiatal hernia with a large amount of fluid-filled stomach present in the left lower chest. Esophagus is mildly dilated with fluid in the esophagus possibly due to reflux. This does not appear to represent gastric volvulus.  Atelectasis or consolidation in the left lung base. Slight fibrosis in the lungs. No pleural effusions. No  pneumothorax. Postoperative changes in the right shoulder.  Review of the MIP images confirms the above findings.  CTA ABDOMEN AND PELVIS FINDINGS  Tortuous abdominal aorta with scattered calcifications. Calcification in the major vessel origins. No evidence of aortic dissection or aneurysm. The abdominal aorta, celiac axis, superior mesenteric artery, single bilateral renal arteries, inferior mesenteric artery, and bilateral iliac, external iliac, internal iliac, and common femoral arteries are patent.  The liver, spleen, gallbladder, pancreas, adrenal glands, kidneys, inferior vena cava, and retroperitoneal lymph nodes are unremarkable. Small bowel and colon are not abnormally distended. No free air or free fluid in the abdomen.  Pelvis: Appendix is normal. Bladder wall is not thickened. No pelvic mass or lymphadenopathy. Focal loculated fluid collection in the left pelvis probably represents a bladder diverticulum although visualization is limited due to streak artifact from left hip arthroplasty. Postoperative changes consistent with mesh repair of the anterior abdominal wall. No residual or recurrent hernia is identified.  Bones: Diffuse bone demineralization. Thoracolumbar scoliosis with prominent degenerative changes throughout. No destructive bone lesions.  Review of the MIP images confirms the above findings.  IMPRESSION: No evidence of dissection of the thoracic or of the abdominal aorta. No aneurysm or significant vascular occlusion.  Large hiatal hernia with most of the stomach in the chest. Stomach is fluid filled with possible reflux into the esophagus. No definite finding of obstruction or volvulus. Atelectasis or consolidation in the left lung base. Probable bladder diverticulum in the pelvis.   Electronically Signed   By: Lucienne Capers M.D.   On: 08/05/2015 03:07    Scheduled Meds: . calcium-vitamin D  1 tablet Oral Q breakfast  . [START ON 08/06/2015] clobetasol cream  1 application Topical  Once per day on Tue Fri  . clopidogrel  75 mg Oral Daily  . enoxaparin (LOVENOX) injection  40 mg Subcutaneous Daily  . irbesartan  75 mg Oral Daily  . loratadine  10 mg Oral Daily  . pantoprazole (PROTONIX) IV  40 mg Intravenous Q12H  . sucralfate  1 g Oral TID WC & HS  . trimethoprim  100 mg Oral Daily   Continuous Infusions:   Time Spent: 15 min   Charlynne Cousins  [facility] Pager [phone]. If 7PM-7AM, please contact night-coverage at [email or web address], password [facility] 08/05/2015, 8:00 AM

## 2015-08-06 DIAGNOSIS — I10 Essential (primary) hypertension: Secondary | ICD-10-CM

## 2015-08-06 DIAGNOSIS — R0789 Other chest pain: Secondary | ICD-10-CM | POA: Diagnosis not present

## 2015-08-06 DIAGNOSIS — R072 Precordial pain: Secondary | ICD-10-CM

## 2015-08-06 LAB — CBC
HCT: 32 % — ABNORMAL LOW (ref 36.0–46.0)
HEMOGLOBIN: 10.4 g/dL — AB (ref 12.0–15.0)
MCH: 30 pg (ref 26.0–34.0)
MCHC: 32.5 g/dL (ref 30.0–36.0)
MCV: 92.2 fL (ref 78.0–100.0)
Platelets: 261 10*3/uL (ref 150–400)
RBC: 3.47 MIL/uL — AB (ref 3.87–5.11)
RDW: 13.2 % (ref 11.5–15.5)
WBC: 8 10*3/uL (ref 4.0–10.5)

## 2015-08-06 MED ORDER — SUCRALFATE 1 GM/10ML PO SUSP: 1.0000 g | Freq: Two times a day (BID) | ORAL | Status: DC

## 2015-08-06 MED ORDER — INFLUENZA VAC SPLIT QUAD 0.5 ML IM SUSY: 0.5000 mL | PREFILLED_SYRINGE | INTRAMUSCULAR | Status: DC

## 2015-08-06 MED ORDER — PANTOPRAZOLE SODIUM 40 MG PO TBEC: 40.0000 mg | DELAYED_RELEASE_TABLET | Freq: Every day | ORAL | Status: DC

## 2015-08-06 NOTE — Progress Notes (Signed)
Patient ambulated from her chair which was by the window in her room to the bathroom and from the bathroom back to the bed with a walker and stand-by assistance.

## 2015-08-06 NOTE — Progress Notes (Signed)
Discharge Note:  Patient alert and oriented X 4 and in no apparent distress.  Patient's daughter present and both verbalized understanding of discharge instructions including information regarding medications, diet, activity, and upcoming appointments.  Patient and daughter confirmed that the patient had all of her personal belongings.  Telemetry discontinued. Peripheral IV discontinued.  Patient taken out by hospital volunteer via wheelchair.

## 2015-08-06 NOTE — Discharge Summary (Signed)
Physician Discharge Summary  [name]:[id number] DOB: 1926-09-07 DOA: 08/04/2015  PCP: Horatio Pel, MD  Admit date: 08/04/2015 Discharge date: 08/06/2015  Time spent: 35 minutes  Recommendations for Outpatient Follow-up:  1. Follow-up with primary care doctor in 2-4 weeks hospital follow-up.  Discharge Diagnoses:  Principal Problem:   Chest pain Active Problems:   Osteoarthritis   Essential hypertension, benign   Discharge Condition: stable  Diet recommendation: regular  Filed Weights   08/06/15 0400  Weight: 72.4 kg (159 lb 9.8 oz)    History of present illness:  81 year old with past medical history of hypertension osteophyte is no presents to the ED complaining of chest pain that started on the day of admission. Nausea and vomiting.  Hospital Course:  Atypical chest pain likely due to GERD/esophagitis: Cardio biomarkers were negative 3, cardiology was consulted recommending no further workup. She was started on IV Protonix and Carafate by the next day her pain was improved. She will continue at home on Protonix twice a day and Carafate. Follow-up with PCP in 2-4 weeks. Her leukocytosis resolved and was likely reactive.  Procedures:  Chest x-ray  CT imaging of the chest abdomen and pelvis  Consultations:  cards  Discharge Exam: Filed Vitals:   08/06/15 0454  BP: 134/61  Pulse: 70  Temp: 97.7 F (36.5 C)  Resp: 18    General: Awake alert and oriented 3 Cardiovascular: Regular rate and rhythm Respiratory: Good air movement and clear to auscultation.  Discharge Instructions   Discharge Instructions    Diet - low sodium heart healthy    Complete by:  As directed      Increase activity slowly    Complete by:  As directed           Current Discharge Medication List    START taking these medications   Details  pantoprazole (PROTONIX) 40 MG tablet Take 1 tablet (40 mg total) by mouth daily. Qty: 30 tablet, Refills: 0     sucralfate (CARAFATE) 1 GM/10ML suspension Take 10 mLs (1 g total) by mouth 2 (two) times daily. Qty: 420 mL, Refills: 0      CONTINUE these medications which have NOT CHANGED   Details  bisacodyl (DULCOLAX) 5 MG EC tablet Take 1 tablet (5 mg total) by mouth daily as needed for mild constipation or moderate constipation. Qty: 30 tablet, Refills: 0    calcium citrate-vitamin D (CITRACAL+D) 315-200 MG-UNIT per tablet Take 1 tablet by mouth daily.     cetirizine (ZYRTEC) 10 MG tablet Take 15 mg by mouth daily.     clobetasol cream (TEMOVATE) 0.03 % Apply 1 application topically 2 (two) times a week.    conjugated estrogens (PREMARIN) vaginal cream Place 1 Applicatorful vaginally 3 (three) times a week.    diclofenac sodium (VOLTAREN) 1 % GEL Apply 2 g topically 2 (two) times daily as needed (pain).     irbesartan (AVAPRO) 75 MG tablet Take 1 tablet (75 mg total) by mouth daily. Qty: 30 tablet, Refills: 0    Multiple Vitamin (MULTIVITAMIN) capsule Take 1 capsule by mouth daily.     Multiple Vitamins-Minerals (PRESERVISION AREDS 2 PO) Take 1 tablet by mouth 2 (two) times daily.     oxyCODONE-acetaminophen (PERCOCET/ROXICET) 5-325 MG per tablet Take 1-2 tablets by mouth every 4 (four) hours as needed for severe pain. Qty: 30 tablet, Refills: 0    polyethylene glycol (MIRALAX / GLYCOLAX) packet Take 17 g by mouth daily as needed for mild constipation. Qty:  14 each, Refills: 0    Probiotic Product (PHILLIPS COLON HEALTH PO) Take 1 tablet by mouth daily.    Ranibizumab (LUCENTIS IO) Inject into the eye every 8 (eight) weeks. Injection in right eye    trimethoprim (TRIMPEX) 100 MG tablet Take 100 mg by mouth daily. UTI prophylaxis.    zoledronic acid (RECLAST) 5 MG/100ML SOLN injection Inject 5 mg into the vein. Once annually.      STOP taking these medications     PRESCRIPTION MEDICATION      PRESCRIPTION MEDICATION      promethazine (PHENERGAN) 12.5 MG tablet         Allergies  Allergen Reactions  . Aspirin     Unknown  . Boniva [Ibandronic Acid]     Unknown  . Morphine     Unknown  . Nitrofuran Derivatives     Unknown  . Penicillins     Unknown reaction . Received Ancef without problem   . Sulfonamide Derivatives     Unknown   Follow-up Information    Follow up with Horatio Pel, MD In 2 weeks.   Specialty:  Internal Medicine   Why:  hospital follow up   Contact information:   [place] [address] [phone]        The results of significant diagnostics from this hospitalization (including imaging, microbiology, ancillary and laboratory) are listed below for reference.    Significant Diagnostic Studies: Dg Chest Port 1 View  08/05/2015   CLINICAL DATA:  Chest pain and shortness of breath tonight.  EXAM: PORTABLE CHEST - 1 VIEW  COMPARISON:  05/05/2014  FINDINGS: Shallow inspiration. Heart size and pulmonary vascularity are normal for technique. Large esophageal hiatal hernia behind the heart. Linear atelectasis in the lung bases. No focal consolidation. No blunting of costophrenic angles. No pneumothorax. Calcification of the aorta. Thoracic scoliosis convex towards the right. Postoperative changes in the right shoulder. Degenerative changes in the left shoulder.  IMPRESSION: No evidence of active pulmonary disease. Esophageal hiatal hernia behind the heart.   Electronically Signed   By: Lucienne Capers M.D.   On: 08/05/2015 01:32   Ct Angio Chest Aorta W/cm &/or Wo/cm  08/05/2015   CLINICAL DATA:  Chest pain and upper back pain.  EXAM: CT ANGIOGRAPHY CHEST, ABDOMEN AND PELVIS  TECHNIQUE: Multidetector CT imaging through the chest, abdomen and pelvis was performed using the standard protocol during bolus administration of intravenous contrast. Multiplanar reconstructed images and MIPs were obtained and reviewed to evaluate the vascular anatomy.  CONTRAST:  140mL OMNIPAQUE IOHEXOL 350 MG/ML SOLN   COMPARISON:  None.  FINDINGS: CTA CHEST FINDINGS  Noncontrast axial images are obtained. Examination is limited due to motion artifact. Calcification of the thoracic aorta. Calcification of coronary arteries. No aortic dilatation. No evidence of intramural hematoma.  Subsequent images obtained during arterial phase of contrast administration. Heart size is normal. Normal caliber thoracic aorta. No evidence of aortic dissection. Great vessel origins are patent. Motion artifact limits examination. There is a large esophageal hiatal hernia with a large amount of fluid-filled stomach present in the left lower chest. Esophagus is mildly dilated with fluid in the esophagus possibly due to reflux. This does not appear to represent gastric volvulus.  Atelectasis or consolidation in the left lung base. Slight fibrosis in the lungs. No pleural effusions. No pneumothorax. Postoperative changes in the right shoulder.  Review of the MIP images confirms the above findings.  CTA ABDOMEN AND PELVIS FINDINGS  Tortuous  abdominal aorta with scattered calcifications. Calcification in the major vessel origins. No evidence of aortic dissection or aneurysm. The abdominal aorta, celiac axis, superior mesenteric artery, single bilateral renal arteries, inferior mesenteric artery, and bilateral iliac, external iliac, internal iliac, and common femoral arteries are patent.  The liver, spleen, gallbladder, pancreas, adrenal glands, kidneys, inferior vena cava, and retroperitoneal lymph nodes are unremarkable. Small bowel and colon are not abnormally distended. No free air or free fluid in the abdomen.  Pelvis: Appendix is normal. Bladder wall is not thickened. No pelvic mass or lymphadenopathy. Focal loculated fluid collection in the left pelvis probably represents a bladder diverticulum although visualization is limited due to streak artifact from left hip arthroplasty. Postoperative changes consistent with mesh repair of the anterior  abdominal wall. No residual or recurrent hernia is identified.  Bones: Diffuse bone demineralization. Thoracolumbar scoliosis with prominent degenerative changes throughout. No destructive bone lesions.  Review of the MIP images confirms the above findings.  IMPRESSION: No evidence of dissection of the thoracic or of the abdominal aorta. No aneurysm or significant vascular occlusion.  Large hiatal hernia with most of the stomach in the chest. Stomach is fluid filled with possible reflux into the esophagus. No definite finding of obstruction or volvulus. Atelectasis or consolidation in the left lung base. Probable bladder diverticulum in the pelvis.   Electronically Signed   By: Lucienne Capers M.D.   On: 08/05/2015 03:07   Ct Cta Abd/pel W/cm &/or W/o Cm  08/05/2015   CLINICAL DATA:  Chest pain and upper back pain.  EXAM: CT ANGIOGRAPHY CHEST, ABDOMEN AND PELVIS  TECHNIQUE: Multidetector CT imaging through the chest, abdomen and pelvis was performed using the standard protocol during bolus administration of intravenous contrast. Multiplanar reconstructed images and MIPs were obtained and reviewed to evaluate the vascular anatomy.  CONTRAST:  135mL OMNIPAQUE IOHEXOL 350 MG/ML SOLN  COMPARISON:  None.  FINDINGS: CTA CHEST FINDINGS  Noncontrast axial images are obtained. Examination is limited due to motion artifact. Calcification of the thoracic aorta. Calcification of coronary arteries. No aortic dilatation. No evidence of intramural hematoma.  Subsequent images obtained during arterial phase of contrast administration. Heart size is normal. Normal caliber thoracic aorta. No evidence of aortic dissection. Great vessel origins are patent. Motion artifact limits examination. There is a large esophageal hiatal hernia with a large amount of fluid-filled stomach present in the left lower chest. Esophagus is mildly dilated with fluid in the esophagus possibly due to reflux. This does not appear to represent gastric  volvulus.  Atelectasis or consolidation in the left lung base. Slight fibrosis in the lungs. No pleural effusions. No pneumothorax. Postoperative changes in the right shoulder.  Review of the MIP images confirms the above findings.  CTA ABDOMEN AND PELVIS FINDINGS  Tortuous abdominal aorta with scattered calcifications. Calcification in the major vessel origins. No evidence of aortic dissection or aneurysm. The abdominal aorta, celiac axis, superior mesenteric artery, single bilateral renal arteries, inferior mesenteric artery, and bilateral iliac, external iliac, internal iliac, and common femoral arteries are patent.  The liver, spleen, gallbladder, pancreas, adrenal glands, kidneys, inferior vena cava, and retroperitoneal lymph nodes are unremarkable. Small bowel and colon are not abnormally distended. No free air or free fluid in the abdomen.  Pelvis: Appendix is normal. Bladder wall is not thickened. No pelvic mass or lymphadenopathy. Focal loculated fluid collection in the left pelvis probably represents a bladder diverticulum although visualization is limited due to streak artifact from left hip arthroplasty. Postoperative changes  consistent with mesh repair of the anterior abdominal wall. No residual or recurrent hernia is identified.  Bones: Diffuse bone demineralization. Thoracolumbar scoliosis with prominent degenerative changes throughout. No destructive bone lesions.  Review of the MIP images confirms the above findings.  IMPRESSION: No evidence of dissection of the thoracic or of the abdominal aorta. No aneurysm or significant vascular occlusion.  Large hiatal hernia with most of the stomach in the chest. Stomach is fluid filled with possible reflux into the esophagus. No definite finding of obstruction or volvulus. Atelectasis or consolidation in the left lung base. Probable bladder diverticulum in the pelvis.   Electronically Signed   By: Lucienne Capers M.D.   On: 08/05/2015 03:07     Microbiology: No results found for this or any previous visit (from the past 240 hour(s)).   Labs: Basic Metabolic Panel:  Recent Labs Lab 08/05/15 0012 08/05/15 0125 08/05/15 0650  NA 134*  --   --   K 3.8  --   --   CL 97*  --   --   CO2 27  --   --   GLUCOSE 149*  --   --   BUN 15  --   --   CREATININE 0.66 0.70 0.69  CALCIUM 9.3  --   --    Liver Function Tests:  Recent Labs Lab 08/05/15 0012  AST 31  ALT 19  ALKPHOS 71  BILITOT 0.4  PROT 6.8  ALBUMIN 4.0    Recent Labs Lab 08/05/15 0650  LIPASE 23   No results for input(s): AMMONIA in the last 168 hours. CBC:  Recent Labs Lab 08/05/15 0012 08/05/15 0650 08/06/15 0425  WBC 11.3* 13.6* 8.0  NEUTROABS 9.1*  --   --   HGB 12.6 12.6 10.4*  HCT 37.4 36.8 32.0*  MCV 91.7 90.2 92.2  PLT 300 286 261   Cardiac Enzymes:  Recent Labs Lab 08/05/15 0650 08/05/15 1135 08/05/15 1759  TROPONINI <0.03 0.05* 0.03   BNP: BNP (last 3 results) No results for input(s): BNP in the last 8760 hours.  ProBNP (last 3 results) No results for input(s): PROBNP in the last 8760 hours.  CBG: No results for input(s): GLUCAP in the last 168 hours.     Signed:  Charlynne Cousins  [facility] 08/06/2015, 8:53 AM

## 2015-08-08 DIAGNOSIS — H3532 Exudative age-related macular degeneration: Secondary | ICD-10-CM | POA: Diagnosis not present

## 2015-08-20 DIAGNOSIS — Z23 Encounter for immunization: Secondary | ICD-10-CM | POA: Diagnosis not present

## 2015-08-20 DIAGNOSIS — K2970 Gastritis, unspecified, without bleeding: Secondary | ICD-10-CM | POA: Diagnosis not present

## 2015-08-20 DIAGNOSIS — Z09 Encounter for follow-up examination after completed treatment for conditions other than malignant neoplasm: Secondary | ICD-10-CM | POA: Diagnosis not present

## 2015-09-26 DIAGNOSIS — H353211 Exudative age-related macular degeneration, right eye, with active choroidal neovascularization: Secondary | ICD-10-CM | POA: Diagnosis not present

## 2015-10-29 DIAGNOSIS — M159 Polyosteoarthritis, unspecified: Secondary | ICD-10-CM | POA: Diagnosis not present

## 2015-11-04 DIAGNOSIS — J069 Acute upper respiratory infection, unspecified: Secondary | ICD-10-CM | POA: Diagnosis not present

## 2015-11-04 DIAGNOSIS — J40 Bronchitis, not specified as acute or chronic: Secondary | ICD-10-CM | POA: Diagnosis not present

## 2015-11-04 DIAGNOSIS — R05 Cough: Secondary | ICD-10-CM | POA: Diagnosis not present

## 2015-11-04 DIAGNOSIS — L728 Other follicular cysts of the skin and subcutaneous tissue: Secondary | ICD-10-CM | POA: Diagnosis not present

## 2015-11-04 DIAGNOSIS — L718 Other rosacea: Secondary | ICD-10-CM | POA: Diagnosis not present

## 2015-11-14 DIAGNOSIS — H353211 Exudative age-related macular degeneration, right eye, with active choroidal neovascularization: Secondary | ICD-10-CM | POA: Diagnosis not present

## 2015-12-16 DIAGNOSIS — Z23 Encounter for immunization: Secondary | ICD-10-CM | POA: Diagnosis not present

## 2015-12-16 DIAGNOSIS — J40 Bronchitis, not specified as acute or chronic: Secondary | ICD-10-CM | POA: Diagnosis not present

## 2015-12-16 DIAGNOSIS — Z791 Long term (current) use of non-steroidal anti-inflammatories (NSAID): Secondary | ICD-10-CM | POA: Diagnosis not present

## 2015-12-16 DIAGNOSIS — K2900 Acute gastritis without bleeding: Secondary | ICD-10-CM | POA: Diagnosis not present

## 2015-12-31 DIAGNOSIS — L570 Actinic keratosis: Secondary | ICD-10-CM | POA: Diagnosis not present

## 2016-01-02 DIAGNOSIS — H353211 Exudative age-related macular degeneration, right eye, with active choroidal neovascularization: Secondary | ICD-10-CM | POA: Diagnosis not present

## 2016-01-06 DIAGNOSIS — K429 Umbilical hernia without obstruction or gangrene: Secondary | ICD-10-CM | POA: Diagnosis not present

## 2016-01-21 DIAGNOSIS — K432 Incisional hernia without obstruction or gangrene: Secondary | ICD-10-CM | POA: Diagnosis not present

## 2016-02-03 DIAGNOSIS — L299 Pruritus, unspecified: Secondary | ICD-10-CM | POA: Diagnosis not present

## 2016-02-03 DIAGNOSIS — G4700 Insomnia, unspecified: Secondary | ICD-10-CM | POA: Diagnosis not present

## 2016-02-06 DIAGNOSIS — L299 Pruritus, unspecified: Secondary | ICD-10-CM | POA: Diagnosis not present

## 2016-02-20 DIAGNOSIS — H353211 Exudative age-related macular degeneration, right eye, with active choroidal neovascularization: Secondary | ICD-10-CM | POA: Diagnosis not present

## 2016-02-24 DIAGNOSIS — M545 Low back pain: Secondary | ICD-10-CM | POA: Diagnosis not present

## 2016-03-02 DIAGNOSIS — I788 Other diseases of capillaries: Secondary | ICD-10-CM | POA: Diagnosis not present

## 2016-04-09 DIAGNOSIS — Z961 Presence of intraocular lens: Secondary | ICD-10-CM | POA: Diagnosis not present

## 2016-04-09 DIAGNOSIS — H353211 Exudative age-related macular degeneration, right eye, with active choroidal neovascularization: Secondary | ICD-10-CM | POA: Diagnosis not present

## 2016-04-09 DIAGNOSIS — H353223 Exudative age-related macular degeneration, left eye, with inactive scar: Secondary | ICD-10-CM | POA: Diagnosis not present

## 2016-04-09 DIAGNOSIS — H2512 Age-related nuclear cataract, left eye: Secondary | ICD-10-CM | POA: Diagnosis not present

## 2016-04-21 DIAGNOSIS — N390 Urinary tract infection, site not specified: Secondary | ICD-10-CM | POA: Diagnosis not present

## 2016-04-21 DIAGNOSIS — R3915 Urgency of urination: Secondary | ICD-10-CM | POA: Diagnosis not present

## 2016-04-21 DIAGNOSIS — N952 Postmenopausal atrophic vaginitis: Secondary | ICD-10-CM | POA: Diagnosis not present

## 2016-04-21 DIAGNOSIS — R8290 Unspecified abnormal findings in urine: Secondary | ICD-10-CM | POA: Diagnosis not present

## 2016-04-23 DIAGNOSIS — M47817 Spondylosis without myelopathy or radiculopathy, lumbosacral region: Secondary | ICD-10-CM | POA: Diagnosis not present

## 2016-04-23 DIAGNOSIS — G894 Chronic pain syndrome: Secondary | ICD-10-CM | POA: Diagnosis not present

## 2016-04-23 DIAGNOSIS — M4156 Other secondary scoliosis, lumbar region: Secondary | ICD-10-CM | POA: Diagnosis not present

## 2016-04-23 DIAGNOSIS — M5187 Other intervertebral disc disorders, lumbosacral region: Secondary | ICD-10-CM | POA: Diagnosis not present

## 2016-04-23 DIAGNOSIS — Z79891 Long term (current) use of opiate analgesic: Secondary | ICD-10-CM | POA: Diagnosis not present

## 2016-04-30 DIAGNOSIS — M810 Age-related osteoporosis without current pathological fracture: Secondary | ICD-10-CM | POA: Diagnosis not present

## 2016-05-07 DIAGNOSIS — M810 Age-related osteoporosis without current pathological fracture: Secondary | ICD-10-CM | POA: Diagnosis not present

## 2016-05-14 DIAGNOSIS — M5187 Other intervertebral disc disorders, lumbosacral region: Secondary | ICD-10-CM | POA: Diagnosis not present

## 2016-05-14 DIAGNOSIS — G894 Chronic pain syndrome: Secondary | ICD-10-CM | POA: Diagnosis not present

## 2016-05-14 DIAGNOSIS — M47817 Spondylosis without myelopathy or radiculopathy, lumbosacral region: Secondary | ICD-10-CM | POA: Diagnosis not present

## 2016-05-14 DIAGNOSIS — M4156 Other secondary scoliosis, lumbar region: Secondary | ICD-10-CM | POA: Diagnosis not present

## 2016-05-20 ENCOUNTER — Other Ambulatory Visit: Payer: Self-pay

## 2016-05-20 ENCOUNTER — Emergency Department (HOSPITAL_COMMUNITY): Payer: Medicare Other

## 2016-05-20 ENCOUNTER — Encounter (HOSPITAL_COMMUNITY): Payer: Self-pay

## 2016-05-20 ENCOUNTER — Emergency Department (HOSPITAL_COMMUNITY)
Admission: EM | Admit: 2016-05-20 | Discharge: 2016-05-21 | Disposition: A | Discharge status: Home / Self Care | Payer: Medicare Other | Attending: Emergency Medicine | Admitting: Emergency Medicine

## 2016-05-20 DIAGNOSIS — R1033 Periumbilical pain: Secondary | ICD-10-CM | POA: Diagnosis present

## 2016-05-20 DIAGNOSIS — K449 Diaphragmatic hernia without obstruction or gangrene: Secondary | ICD-10-CM | POA: Diagnosis not present

## 2016-05-20 DIAGNOSIS — Z87891 Personal history of nicotine dependence: Secondary | ICD-10-CM | POA: Diagnosis not present

## 2016-05-20 DIAGNOSIS — Z96653 Presence of artificial knee joint, bilateral: Secondary | ICD-10-CM | POA: Diagnosis not present

## 2016-05-20 DIAGNOSIS — K429 Umbilical hernia without obstruction or gangrene: Secondary | ICD-10-CM | POA: Insufficient documentation

## 2016-05-20 DIAGNOSIS — Z79899 Other long term (current) drug therapy: Secondary | ICD-10-CM | POA: Insufficient documentation

## 2016-05-20 DIAGNOSIS — E785 Hyperlipidemia, unspecified: Secondary | ICD-10-CM | POA: Insufficient documentation

## 2016-05-20 DIAGNOSIS — I10 Essential (primary) hypertension: Secondary | ICD-10-CM | POA: Diagnosis not present

## 2016-05-20 DIAGNOSIS — R1032 Left lower quadrant pain: Secondary | ICD-10-CM | POA: Diagnosis not present

## 2016-05-20 LAB — CBC WITH DIFFERENTIAL/PLATELET
BASOS ABS: 0.1 10*3/uL (ref 0.0–0.1)
Basophils Relative: 1 %
EOS ABS: 0.1 10*3/uL (ref 0.0–0.7)
EOS PCT: 1 %
HEMATOCRIT: 34.7 % — AB (ref 36.0–46.0)
Hemoglobin: 11.3 g/dL — ABNORMAL LOW (ref 12.0–15.0)
Lymphocytes Relative: 17 %
Lymphs Abs: 1.4 10*3/uL (ref 0.7–4.0)
MCH: 29.8 pg (ref 26.0–34.0)
MCHC: 32.6 g/dL (ref 30.0–36.0)
MCV: 91.6 fL (ref 78.0–100.0)
MONOS PCT: 11 %
Monocytes Absolute: 0.9 10*3/uL (ref 0.1–1.0)
NEUTROS PCT: 70 %
Neutro Abs: 5.5 10*3/uL (ref 1.7–7.7)
PLATELETS: 348 10*3/uL (ref 150–400)
RBC: 3.79 MIL/uL — AB (ref 3.87–5.11)
RDW: 11.9 % (ref 11.5–15.5)
WBC: 8 10*3/uL (ref 4.0–10.5)

## 2016-05-20 LAB — COMPREHENSIVE METABOLIC PANEL
ALBUMIN: 3.7 g/dL (ref 3.5–5.0)
ALK PHOS: 68 U/L (ref 38–126)
ALT: 14 U/L (ref 14–54)
ANION GAP: 7 (ref 5–15)
AST: 25 U/L (ref 15–41)
BILIRUBIN TOTAL: 0.3 mg/dL (ref 0.3–1.2)
BUN: 13 mg/dL (ref 6–20)
CO2: 28 mmol/L (ref 22–32)
Calcium: 9.3 mg/dL (ref 8.9–10.3)
Chloride: 97 mmol/L — ABNORMAL LOW (ref 101–111)
Creatinine, Ser: 0.72 mg/dL (ref 0.44–1.00)
GFR calc Af Amer: >60 mL/min (ref >60)
GFR calc non Af Amer: >60 mL/min (ref >60)
GLUCOSE: 121 mg/dL — AB (ref 65–99)
POTASSIUM: 4.4 mmol/L (ref 3.5–5.1)
SODIUM: 132 mmol/L — AB (ref 135–145)
TOTAL PROTEIN: 6.5 g/dL (ref 6.5–8.1)

## 2016-05-20 LAB — LACTIC ACID, PLASMA: Lactic Acid, Venous: 1.1 mmol/L (ref 0.5–2.0)

## 2016-05-20 MED ORDER — FENTANYL CITRATE (PF) 100 MCG/2ML IJ SOLN
50.0000 ug | Freq: Once | INTRAMUSCULAR | Status: AC
  Administered 2016-05-20: 50 ug via INTRAVENOUS
  Filled 2016-05-20: qty 2

## 2016-05-20 MED ORDER — IOPAMIDOL (ISOVUE-300) INJECTION 61%
INTRAVENOUS | Status: AC
  Administered 2016-05-20: 100 mL
  Filled 2016-05-20: qty 100

## 2016-05-20 NOTE — ED Notes (Signed)
Patient transported to CT 

## 2016-05-20 NOTE — Discharge Instructions (Signed)
Hernia, Adult A hernia is the bulging of an organ or tissue through a weak spot in the muscles of the abdomen (abdominal wall). Hernias develop most often near the navel or groin. There are many kinds of hernias. Common kinds include:  Femoral hernia. This kind of hernia develops under the groin in the upper thigh area.  Inguinal hernia. This kind of hernia develops in the groin or scrotum.  Umbilical hernia. This kind of hernia develops near the navel.  Hiatal hernia. This kind of hernia causes part of the stomach to be pushed up into the chest.  Incisional hernia. This kind of hernia bulges through a scar from an abdominal surgery. CAUSES This condition may be caused by:  Heavy lifting.  Coughing over a long period of time.  Straining to have a bowel movement.  An incision made during an abdominal surgery.  A birth defect (congenital defect).  Excess weight or obesity.  Smoking.  Poor nutrition.  Cystic fibrosis.  Excess fluid in the abdomen.  Undescended testicles. SYMPTOMS Symptoms of a hernia include:  A lump on the abdomen. This is the first sign of a hernia. The lump may become more obvious with standing, straining, or coughing. It may get bigger over time if it is not treated or if the condition causing it is not treated.  Pain. A hernia is usually painless, but it may become painful over time if treatment is delayed. The pain is usually dull and may get worse with standing or lifting heavy objects. Sometimes a hernia gets tightly squeezed in the weak spot (strangulated) or stuck there (incarcerated) and causes additional symptoms. These symptoms may include:  Vomiting.  Nausea.  Constipation.  Irritability. DIAGNOSIS A hernia may be diagnosed with:  A physical exam. During the exam your health care provider may ask you to cough or to make a specific movement, because a hernia is usually more visible when you move.  Imaging tests. These can  include:  X-rays.  Ultrasound.  CT scan. TREATMENT A hernia that is small and painless may not need to be treated. A hernia that is large or painful may be treated with surgery. Inguinal hernias may be treated with surgery to prevent incarceration or strangulation. Strangulated hernias are always treated with surgery, because lack of blood to the trapped organ or tissue can cause it to die. Surgery to treat a hernia involves pushing the bulge back into place and repairing the weak part of the abdomen. HOME CARE INSTRUCTIONS  Avoid straining.  Do not lift anything heavier than 10 lb (4.5 kg).  Lift with your leg muscles, not your back muscles. This helps avoid strain.  When coughing, try to cough gently.  Prevent constipation. Constipation leads to straining with bowel movements, which can make a hernia worse or cause a hernia repair to break down. You can prevent constipation by:  Eating a high-fiber diet that includes plenty of fruits and vegetables.  Drinking enough fluids to keep your urine clear or pale yellow. Aim to drink 6-8 glasses of water per day.  Using a stool softener as directed by your health care provider.  Lose weight, if you are overweight.  Do not use any tobacco products, including cigarettes, chewing tobacco, or electronic cigarettes. If you need help quitting, ask your health care provider.  Keep all follow-up visits as directed by your health care provider. This is important. Your health care provider may need to monitor your condition. SEEK MEDICAL CARE IF:  You have   swelling, redness, and pain in the affected area.  Your bowel habits change. SEEK IMMEDIATE MEDICAL CARE IF:  You have a fever.  You have abdominal pain that is getting worse.  You feel nauseous or you vomit.  You cannot push the hernia back in place by gently pressing on it while you are lying down.  The hernia:  Changes in shape or size.  Is stuck outside the  abdomen.  Becomes discolored.  Feels hard or tender.   This information is not intended to replace advice given to you by your health care provider. Make sure you discuss any questions you have with your health care provider.   Document Released: 11/16/2005 Document Revised: 12/07/2014 Document Reviewed: 09/26/2014 Else[employer] Interactive Patient Education 2016 [employer]  

## 2016-05-20 NOTE — ED Notes (Signed)
Pt has known umbilical and hiatal hernia that is causing pain x 3 hours. Denies n/v. Pt pain is located just above the umbilicus and tender to palpation

## 2016-05-20 NOTE — ED Provider Notes (Signed)
CSN: [id number]     Arrival date & time 05/20/16  2019 History   First MD Initiated Contact with Patient 05/20/16 2038     Chief Complaint  Patient presents with  . Abdominal Pain   HPI Patient presents with umbilical pain for the last 3 hours.. She states she has a medical and hiatal hernia and that the pain is present there. She does not know if she is able to reduce it typically she does not do that. She has pain exactly where the umbilicus is. She has not been vomiting and is not nauseous. No diarrhea no constipation. She is passing gas. Patient has had repair of inguinal hernias in the past but no other abdominal surgeries. Denies any fevers, no dysuria. She has not taken anything for pain. She states her pain is severe. Movement appears to make the pain worse. The pain is sharp.  Past Medical History  Diagnosis Date  . Hyperlipidemia   . Mitral regurgitation     mild by echo 2003  . Allergic rhinitis   . Actinic keratosis   . Tenosynovitis     myxomatous  . Chronic constipation   . GERD (gastroesophageal reflux disease)   . Osteoarthritis   . Back pain   . Macular degeneration   . Hypertension   . Osteoarthrosis   . Osteoporosis   . Hyponatremia   . Leg edema    Past Surgical History  Procedure Laterality Date  . Synovectomy      of right long finger  . Hernial repain    . Inguinal hernia repair      laparoscopic preperitoneal repair of bilateral inguinal hernias  . Carpal tunnel release      bilateral  . Replacement total knee bilateral    . Tonisellectomy    . Tonisillectomy    . Tonsillectomy    . Reverse shoulder arthroplasty Right 05/08/2014    Procedure: REVERSE SHOULDER ARTHROPLASTY RIGHT ;  Surgeon: Marin Shutter, MD;  Location: [facility];  Service: Orthopedics;  Laterality: Right;   Family History  Problem Relation Age of Onset  . Heart attack Father   . Heart attack Brother    Social History  Substance Use Topics  . Smoking status: Former Smoker -- 1.00  packs/day for 2 years    Types: Cigarettes  . Smokeless tobacco: None  . Alcohol Use: 1.2 oz/week    2 Glasses of wine per week     Comment: 2 glasses per week   OB History    No data available     Review of Systems  Constitutional: Negative for fever.  Allergic/Immunologic: Negative for immunocompromised state.  All other systems reviewed and are negative.     Allergies  Aspirin; Boniva; Morphine; Nitrofuran derivatives; Penicillins; and Sulfonamide derivatives  Home Medications   Prior to Admission medications   Medication Sig Start Date End Date Taking? Authorizing Provider  calcium citrate-vitamin D (CITRACAL+D) 315-200 MG-UNIT per tablet Take 1 tablet by mouth daily.    Yes Historical Provider, MD  cetirizine (ZYRTEC) 10 MG tablet Take 15 mg by mouth daily.    Yes Historical Provider, MD  conjugated estrogens (PREMARIN) vaginal cream Place 1 Applicatorful vaginally 3 (three) times a week.   Yes Historical Provider, MD  docusate sodium (COLACE) 100 MG capsule Take 100 mg by mouth 2 (two) times daily.   Yes Historical Provider, MD  gabapentin (NEURONTIN) 100 MG capsule Take 100 mg by mouth at bedtime.   Yes Historical  Provider, MD  HYDROcodone-acetaminophen (NORCO/VICODIN) 5-325 MG tablet Take 1 tablet by mouth 3 (three) times daily.   Yes Historical Provider, MD  irbesartan (AVAPRO) 75 MG tablet Take 1 tablet (75 mg total) by mouth daily. 05/10/14  Yes Orson Eva, MD  Multiple Vitamin (MULTIVITAMIN) capsule Take 1 capsule by mouth daily.    Yes Historical Provider, MD  Multiple Vitamins-Minerals (PRESERVISION AREDS 2 PO) Take 1 tablet by mouth 2 (two) times daily.    Yes Historical Provider, MD  omeprazole (PRILOSEC) 20 MG capsule Take 20 mg by mouth 2 (two) times daily before a meal.   Yes Historical Provider, MD  Probiotic Product ([place]) Take 1 tablet by mouth daily.   Yes Historical Provider, MD  Ranibizumab (LUCENTIS IO) Inject into the eye every 8  (eight) weeks. Injection in right eye   Yes Historical Provider, MD  trimethoprim (TRIMPEX) 100 MG tablet Take 100 mg by mouth daily. UTI prophylaxis.   Yes Historical Provider, MD  zoledronic acid (RECLAST) 5 MG/100ML SOLN injection Inject 5 mg into the vein See admin instructions. Once annually.   Yes Historical Provider, MD   BP 155/76 mmHg  Pulse 78  Temp(Src) 98 F (36.7 C) (Oral)  Resp 15  Ht 5\' 1"  (1.549 m)  Wt 68.04 kg  BMI 28.36 kg/m2  SpO2 98% Physical Exam  Constitutional: She is oriented to person, place, and time. She appears well-developed and well-nourished. No distress.  HENT:  Head: Normocephalic and atraumatic.  Eyes: Conjunctivae are normal. Right eye exhibits no discharge. Left eye exhibits no discharge.  Neck: Normal range of motion. Neck supple.  Cardiovascular: Normal rate and regular rhythm.   Pulmonary/Chest: Effort normal and breath sounds normal. No respiratory distress.  Abdominal: Soft. Bowel sounds are normal. She exhibits no distension and no mass. There is tenderness (Umbilical hernia palpated and tender, successfully able to reduce after gentle pressure and patient felt significantly better). There is guarding (Mild tenderness at hernia site). There is no rebound.  Neg cvat Neg murphys sign  Musculoskeletal: She exhibits no edema.  Neurological: She is alert and oriented to person, place, and time.  Skin: Skin is warm. No rash noted.  Psychiatric: She has a normal mood and affect.  Nursing note and vitals reviewed.   ED Course  Procedures (including critical care time) Labs Review Labs Reviewed  CBC WITH DIFFERENTIAL/PLATELET - Abnormal; Notable for the following:    RBC 3.79 (*)    Hemoglobin 11.3 (*)    HCT 34.7 (*)    All other components within normal limits  COMPREHENSIVE METABOLIC PANEL - Abnormal; Notable for the following:    Sodium 132 (*)    Chloride 97 (*)    Glucose, Bld 121 (*)    All other components within normal limits   LACTIC ACID, PLASMA  LIPASE, BLOOD    Imaging Review Ct Abdomen Pelvis W Contrast  05/20/2016  CLINICAL DATA:  Pt has known umbilical and hiatal hernia that is causing pain x 3 hours. Denies n/v. Pt pain is located just above the umbilicus and tender to palpation EXAM: CT ABDOMEN AND PELVIS WITH CONTRAST TECHNIQUE: Multidetector CT imaging of the abdomen and pelvis was performed using the standard protocol following bolus administration of intravenous contrast. CONTRAST:  152mL ISOVUE-300 IOPAMIDOL (ISOVUE-300) INJECTION 61% COMPARISON:  08/05/2015 FINDINGS: Lower chest: Large hiatal hernia involving most of the stomach. Adjacent atelectasis in the left lower lobe. Mitral annulus calcifications. Scattered coronary calcifications. No pleural or pericardial  effusion. Hepatobiliary: No masses or other significant abnormality. Pancreas: No mass, inflammatory changes, or other significant abnormality. Spleen: Within normal limits in size and appearance. Adrenals/Urinary Tract: No masses identified. No evidence of hydronephrosis. Urinary bladder is distended, with bilateral posterolateral diverticula. Stomach/Bowel: Most of the stomach is intrathoracic involved with the left hiatal hernia. No evidence of torsion or obstruction. Small bowel and colon are nondilated. Normal appendix. Vascular/Lymphatic: Tortuous atheromatous aorta without aneurysm or stenosis. Portal vein patent. No adenopathy localized. Bilateral pelvic phleboliths. Reproductive: No mass or other significant abnormality. Other: No ascites. No free air. Laparoscopic hernia repair sutures in the anterior pelvis. Small umbilical hernia containing on in only mesenteric fat, with mild inflammatory/ edematous change. Musculoskeletal: There is a thoracolumbar levoscoliosis apex L2 with advanced spondylitic changes throughout the lumbar spine. Left hip arthroplasty components are partially seen. Negative for fracture. IMPRESSION: 1. Large hiatal hernia  involving most of the stomach. 2. Small umbilical hernia containing mesenteric fat, with edema/inflammation. 3. Atherosclerosis, including Aortic Atherosclerosis (ICD10-170.0) and coronary artery disease. Please note that although the presence of coronary artery calcium documents the presence of coronary artery disease, the severity of this disease and any potential stenosis cannot be assessed on this non-gated CT examination. Assessment for potential risk factor modification, dietary therapy or pharmacologic therapy may be warranted, if clinically indicated. 4. Lumbar levoscoliosis with advanced spondylitic changes. 5. Diverticula from the urinary bladder. Electronically Signed   By: Lucrezia Europe M.D.   On: 05/20/2016 23:51   I have personally reviewed and evaluated these images and lab results as part of my medical decision-making.   EKG Interpretation None      MDM   Final diagnoses:  Umbilical hernia without obstruction and without gangrene    Patient felt significantly better after reduction of the hernia by myself. Labs unremarkable, no urinary symptoms to suggest UTI. CT of the abdomen is negative or small bowel obstruction or acute abdomen. Patient significantly better and vital signs stable. Return for intractable vomiting, inability to pass gas, severe abdominal pain or other concerning symptoms. Patient discharged in good condition   Karma Greaser, MD 05/21/16 0002  Jola Schmidt, MD 05/21/16 [phone]

## 2016-05-20 NOTE — ED Notes (Signed)
Pt back from CT

## 2016-05-21 NOTE — ED Notes (Signed)
Waiting for abd binder from central supply

## 2016-05-22 DIAGNOSIS — S299XXA Unspecified injury of thorax, initial encounter: Secondary | ICD-10-CM | POA: Diagnosis not present

## 2016-05-22 DIAGNOSIS — R0789 Other chest pain: Secondary | ICD-10-CM | POA: Diagnosis not present

## 2016-05-22 DIAGNOSIS — R0781 Pleurodynia: Secondary | ICD-10-CM | POA: Diagnosis not present

## 2016-05-28 DIAGNOSIS — H353211 Exudative age-related macular degeneration, right eye, with active choroidal neovascularization: Secondary | ICD-10-CM | POA: Diagnosis not present

## 2016-06-01 DIAGNOSIS — R600 Localized edema: Secondary | ICD-10-CM | POA: Diagnosis not present

## 2016-06-03 DIAGNOSIS — R600 Localized edema: Secondary | ICD-10-CM | POA: Diagnosis not present

## 2016-06-08 ENCOUNTER — Encounter: Payer: Self-pay | Admitting: Podiatry

## 2016-06-08 ENCOUNTER — Ambulatory Visit (INDEPENDENT_AMBULATORY_CARE_PROVIDER_SITE_OTHER): Payer: Medicare Other | Admitting: Podiatry

## 2016-06-08 VITALS — BP 142/83 | HR 88 | Resp 16

## 2016-06-08 DIAGNOSIS — M79675 Pain in left toe(s): Secondary | ICD-10-CM

## 2016-06-08 DIAGNOSIS — M79605 Pain in left leg: Secondary | ICD-10-CM

## 2016-06-08 DIAGNOSIS — M79674 Pain in right toe(s): Secondary | ICD-10-CM

## 2016-06-08 DIAGNOSIS — B351 Tinea unguium: Secondary | ICD-10-CM

## 2016-06-08 DIAGNOSIS — M79604 Pain in right leg: Secondary | ICD-10-CM

## 2016-06-08 DIAGNOSIS — R600 Localized edema: Secondary | ICD-10-CM

## 2016-06-08 NOTE — Progress Notes (Signed)
   Subjective:    Patient ID: [name], female    DOB: July 17, 1926, 82 y.o.   MRN: [id number]  HPI    Review of Systems  Cardiovascular: Positive for leg swelling.  Hematological: Bruises/bleeds easily.  All other systems reviewed and are negative.      Objective:   Physical Exam        Assessment & Plan:

## 2016-06-09 ENCOUNTER — Ambulatory Visit
Admission: RE | Admit: 2016-06-09 | Discharge: 2016-06-09 | Disposition: A | Discharge status: Home / Self Care | Payer: Medicare Other | Source: Ambulatory Visit | Attending: Internal Medicine | Admitting: Internal Medicine

## 2016-06-09 ENCOUNTER — Other Ambulatory Visit: Payer: Self-pay | Admitting: Internal Medicine

## 2016-06-09 DIAGNOSIS — M7989 Other specified soft tissue disorders: Secondary | ICD-10-CM | POA: Diagnosis not present

## 2016-06-09 DIAGNOSIS — R600 Localized edema: Secondary | ICD-10-CM | POA: Diagnosis not present

## 2016-06-09 DIAGNOSIS — M79604 Pain in right leg: Secondary | ICD-10-CM

## 2016-06-09 DIAGNOSIS — M79605 Pain in left leg: Principal | ICD-10-CM

## 2016-06-09 DIAGNOSIS — K649 Unspecified hemorrhoids: Secondary | ICD-10-CM | POA: Diagnosis not present

## 2016-06-10 NOTE — Progress Notes (Signed)
Subjective:     Patient ID: [name], female   DOB: [birth year], 82 y.o.   MRN: [id number]  HPI patient presents with significant edema in the forefoot and legs bilateral that's been treated by her family physician at this time and has nail disease 1-5 both feet that are thick yellow brittle and can be painful and they cannot cut   Review of Systems  All other systems reviewed and are negative.      Objective:   Physical Exam  Constitutional: She is oriented to person, place, and time.  Cardiovascular: Intact distal pulses.   Musculoskeletal: Normal range of motion.  Neurological: She is oriented to person, place, and time.  Skin: Skin is warm and dry.  Nursing note and vitals reviewed.  neurovascular status was found to be intact with muscle strength adequate and patient found to have significant edema in the forefoot bilateral and into the ankle and lower leg bilateral with negative Homans sign noted. Patient has been on spironolactone which is not adequately handle this and is due to see the family physician at this time. Patient has nail disease that are thick 1-5 both feet and are painful when palpated     Assessment:     Edema which we'll need to be addressed and I suggested the consideration for Unna boots that she will talk about with her family physician. Patient has very thick incurvated nailbeds 1-5 both feet that are painful    Plan:     H&P and conditions reviewed. I went ahead today debrided nailbeds 1-5 both feet with no iatrogenic bleeding and reappoint to recheck

## 2016-06-14 ENCOUNTER — Ambulatory Visit (HOSPITAL_BASED_OUTPATIENT_CLINIC_OR_DEPARTMENT_OTHER): Payer: PRIVATE HEALTH INSURANCE

## 2016-06-18 DIAGNOSIS — M47817 Spondylosis without myelopathy or radiculopathy, lumbosacral region: Secondary | ICD-10-CM | POA: Diagnosis not present

## 2016-06-18 DIAGNOSIS — M4156 Other secondary scoliosis, lumbar region: Secondary | ICD-10-CM | POA: Diagnosis not present

## 2016-06-18 DIAGNOSIS — G894 Chronic pain syndrome: Secondary | ICD-10-CM | POA: Diagnosis not present

## 2016-06-18 DIAGNOSIS — M5187 Other intervertebral disc disorders, lumbosacral region: Secondary | ICD-10-CM | POA: Diagnosis not present

## 2016-07-02 ENCOUNTER — Ambulatory Visit (HOSPITAL_BASED_OUTPATIENT_CLINIC_OR_DEPARTMENT_OTHER): Payer: Medicare Other | Attending: Physical Medicine and Rehabilitation | Admitting: Internal Medicine

## 2016-07-02 VITALS — Ht 61.0 in | Wt 150.0 lb

## 2016-07-02 DIAGNOSIS — G4700 Insomnia, unspecified: Secondary | ICD-10-CM | POA: Diagnosis not present

## 2016-07-02 DIAGNOSIS — G4733 Obstructive sleep apnea (adult) (pediatric): Secondary | ICD-10-CM | POA: Diagnosis not present

## 2016-07-02 DIAGNOSIS — Z79899 Other long term (current) drug therapy: Secondary | ICD-10-CM | POA: Insufficient documentation

## 2016-07-02 DIAGNOSIS — R5383 Other fatigue: Secondary | ICD-10-CM | POA: Insufficient documentation

## 2016-07-02 DIAGNOSIS — R0683 Snoring: Secondary | ICD-10-CM | POA: Insufficient documentation

## 2016-07-02 DIAGNOSIS — G2581 Restless legs syndrome: Secondary | ICD-10-CM | POA: Insufficient documentation

## 2016-07-11 DIAGNOSIS — G4733 Obstructive sleep apnea (adult) (pediatric): Secondary | ICD-10-CM | POA: Diagnosis not present

## 2016-07-11 NOTE — Procedures (Signed)
Patient Name: [name], [name] Date: 07/02/2016 Gender: Female D.O.B: [birth year] Age (years): 76 Referring Provider: Margaretha Sheffield Height (inches): 61 Interpreting Physician: Baird Lyons MD, ABSM Weight (lbs): 150 RPSGT: Baxter Flattery BMI: 28 MRN: [id number] Neck Size: 15.00 CLINICAL INFORMATION Sleep Study Type: Split Night CPAP Indication for sleep study: Fatigue, OSA, Snoring, Witnessed Apneas Epworth Sleepiness Score: 6  SLEEP STUDY TECHNIQUE As per the AASM Manual for the Scoring of Sleep and Associated Events v2.3 (April 2016) with a hypopnea requiring 4% desaturations. The channels recorded and monitored were frontal, central and occipital EEG, electrooculogram (EOG), submentalis EMG (chin), nasal and oral airflow, thoracic and abdominal wall motion, anterior tibialis EMG, snore microphone, electrocardiogram, and pulse oximetry. Continuous positive airway pressure (CPAP) was initiated when the patient met split night criteria and was titrated according to treat sleep-disordered breathing.  MEDICATIONS Medications taken by the patient : charted for review Medications administered by patient during sleep study : DOXEPIN, GABAPENTIN  RESPIRATORY PARAMETERS Diagnostic Total AHI (/hr): 32.5 RDI (/hr): 32.5 OA Index (/hr): 7.1 CA Index (/hr): 0.0 REM AHI (/hr): 83.5 NREM AHI (/hr): 28.0 Supine AHI (/hr): 32.5 Non-supine AHI (/hr): N/A Min O2 Sat (%): 80.00 Mean O2 (%): 93.59 Time below 88% (min): 8.5   Titration Optimal Pressure (cm): 9 AHI at Optimal Pressure (/hr): 0.0 Min O2 at Optimal Pressure (%): 94.0 Supine % at Optimal (%): 100 Sleep % at Optimal (%): 10    SLEEP ARCHITECTURE The recording time for the entire night was 371.2 minutes. During a baseline period of 190.5 minutes, the patient slept for 144.2 minutes in REM and nonREM, yielding a sleep efficiency of 75.7%. Sleep onset after lights out was 3.8 minutes with a REM latency of 74.5 minutes. The patient  spent 4.51% of the night in stage N1 sleep, 54.79% in stage N2 sleep, 32.73% in stage N3 and 7.98% in REM. During the titration period of 178.9 minutes, the patient slept for 36.0 minutes in REM and nonREM, yielding a sleep efficiency of 20.1%. Sleep onset after CPAP initiation was 20.5 minutes with a REM latency of 55.0 minutes. The patient spent 11.11% of the night in stage N1 sleep, 66.67% in stage N2 sleep, 0.00% in stage N3 and 22.22% in REM.  CARDIAC DATA The 2 lead EKG demonstrated sinus rhythm. The mean heart rate was 67.09 beats per minute. Other EKG findings include: None.  LEG MOVEMENT DATA The total Periodic Limb Movements of Sleep (PLMS) were 56. The PLMS index was 18.61 .  IMPRESSIONS - Severe obstructive sleep apnea occurred during the diagnostic portion of the study (AHI = 32.5/hour). An optimal PAP pressure was selected for this patient ( 9 cm of water) - No significant central sleep apnea occurred during the diagnostic portion of the study (CAI = 0.0/hour). - Moderate oxygen desaturation was noted during the diagnostic portion of the study (Min O2 =80.00%). - No snoring was audible during the diagnostic portion of the study. - No cardiac abnormalities were noted during this study. - Clinically significant periodic limb movements did not occur during sleep. - Patient had difficulty regaining sleep after each bathroom visit.  DIAGNOSIS - Obstructive Sleep Apnea (327.23 [G47.33 ICD-10])  RECOMMENDATIONS - Trial of CPAP therapy on 9 cm H2O with a Large size Resmed Full Face Mask AirFit F20 mask and heated humidification. - Avoid alcohol, sedatives and other CNS depressants that may worsen sleep apnea and disrupt normal sleep architecture. - Sleep hygiene should be reviewed to assess factors that may improve  sleep quality. - Weight management and regular exercise should be initiated or continued.  [Electronically signed] 07/11/2016 12:44 PM  Baird Lyons MD,  [place], American Board of Sleep Medicine   NPI: [id number] [place], American Board of Sleep Medicine  ELECTRONICALLY SIGNED ON:  07/11/2016, 12:45 PM [place] PH: [phone]   FX: [phone] [place]

## 2016-07-16 DIAGNOSIS — H353211 Exudative age-related macular degeneration, right eye, with active choroidal neovascularization: Secondary | ICD-10-CM | POA: Diagnosis not present

## 2016-07-16 DIAGNOSIS — H353223 Exudative age-related macular degeneration, left eye, with inactive scar: Secondary | ICD-10-CM | POA: Diagnosis not present

## 2016-07-28 ENCOUNTER — Ambulatory Visit (INDEPENDENT_AMBULATORY_CARE_PROVIDER_SITE_OTHER): Payer: Medicare Other | Admitting: Cardiovascular Disease

## 2016-07-28 ENCOUNTER — Encounter: Payer: Self-pay | Admitting: Cardiovascular Disease

## 2016-07-28 VITALS — BP 122/74 | HR 89 | Ht 61.0 in | Wt 153.4 lb

## 2016-07-28 DIAGNOSIS — I110 Hypertensive heart disease with heart failure: Secondary | ICD-10-CM | POA: Diagnosis not present

## 2016-07-28 DIAGNOSIS — R600 Localized edema: Secondary | ICD-10-CM

## 2016-07-28 NOTE — Patient Instructions (Signed)
Medication Instructions:  Your physician recommends that you continue on your current medications as directed. Please refer to the Current Medication list given to you today.   Labwork: none  Testing/Procedures: none  Follow-Up: Your physician recommends that you schedule a follow-up appointment as needed with Dr. McAlhany    Any Other Special Instructions Will Be Listed Below (If Applicable).     If you need a refill on your cardiac medications before your next appointment, please call your pharmacy.   

## 2016-07-28 NOTE — Progress Notes (Signed)
Chief Complaint  Patient presents with  . Leg Swelling     History of Present Illness: 82 yo female with history of HTN, HLD, GERD who is here today for cardiac follow up. I saw her in 2012 for c/o chest pain. Stress test showed no ischemia. Echo with normal LV systolic function with mitral annular calcification and mild MR. Most recent echo June 2015 with normal LV systolic function, moderate LVH. She was seen in September 2016 at [facility] by Dr. Johnsie Cancel for atypical chest pain felt to be GI related. A large hiatal hernia was noted the CT scan in 2016.   She is here today for follow up. She has had no chest pain or dyspnea. She has had recent bilateral lower extremity swelling. She has been seen in primary care. She was started on aldactone. Her LE edema resolved. Venous dopplers without evidence of DVT. She lives with her son. She has been elevating her legs. She is overall feeling well.   Primary Care Physician: Horatio Pel, MD   Past Medical History:  Diagnosis Date  . Actinic keratosis   . Allergic rhinitis   . Back pain   . Chronic constipation   . GERD (gastroesophageal reflux disease)   . Hyperlipidemia   . Hypertension   . Hyponatremia   . Leg edema   . Macular degeneration   . Mitral regurgitation    mild by echo 2003  . Osteoarthritis   . Osteoarthrosis   . Osteoporosis   . Tenosynovitis    myxomatous    Past Surgical History:  Procedure Laterality Date  . CARPAL TUNNEL RELEASE     bilateral  . hernial repain    . INGUINAL HERNIA REPAIR     laparoscopic preperitoneal repair of bilateral inguinal hernias  . REPLACEMENT TOTAL KNEE BILATERAL    . REVERSE SHOULDER ARTHROPLASTY Right 05/08/2014   Procedure: REVERSE SHOULDER ARTHROPLASTY RIGHT ;  Surgeon: Marin Shutter, MD;  Location: [facility];  Service: Orthopedics;  Laterality: Right;  . SYNOVECTOMY     of right long finger  . tonisellectomy    . tonisillectomy    . TONSILLECTOMY      Current Outpatient  Prescriptions  Medication Sig Dispense Refill  . calcium citrate-vitamin D (CITRACAL+D) 315-200 MG-UNIT per tablet Take 1 tablet by mouth daily.     . cetirizine (ZYRTEC) 10 MG tablet Take 15 mg by mouth daily.     Marland Kitchen conjugated estrogens (PREMARIN) vaginal cream Place 1 Applicatorful vaginally 3 (three) times a week.    . docusate sodium (COLACE) 100 MG capsule Take 100 mg by mouth 2 (two) times daily.    Marland Kitchen gabapentin (NEURONTIN) 100 MG capsule Take 100 mg by mouth at bedtime.    Marland Kitchen HYDROcodone-acetaminophen (NORCO/VICODIN) 5-325 MG tablet Take 1 tablet by mouth 3 (three) times daily.    . irbesartan (AVAPRO) 75 MG tablet Take 1 tablet (75 mg total) by mouth daily. 30 tablet 0  . Multiple Vitamin (MULTIVITAMIN) capsule Take 1 capsule by mouth daily.     . Multiple Vitamins-Minerals (PRESERVISION AREDS 2 PO) Take 1 tablet by mouth 2 (two) times daily.     Marland Kitchen omeprazole (PRILOSEC) 20 MG capsule Take 20 mg by mouth 2 (two) times daily before a meal.    . Probiotic Product ([place] COLON HEALTH PO) Take 1 tablet by mouth daily.    . Ranibizumab (LUCENTIS IO) Inject into the eye every 8 (eight) weeks. Injection in right eye    .  spironolactone (ALDACTONE) 25 MG tablet Take 25 mg by mouth 3 (three) times daily.     Marland Kitchen trimethoprim (TRIMPEX) 100 MG tablet Take 100 mg by mouth daily. UTI prophylaxis.    Marland Kitchen zoledronic acid (RECLAST) 5 MG/100ML SOLN injection Inject 5 mg into the vein See admin instructions. Once annually.     No current facility-administered medications for this visit.     Allergies  Allergen Reactions  . Aspirin     Unknown  . Boniva [Ibandronic Acid]     Unknown  . Morphine     Unknown  . Nitrofuran Derivatives     Unknown  . Penicillins     Unknown reaction . Received Ancef without problem   . Sulfonamide Derivatives     Unknown    Social History   Social History  . Marital status: Divorced    Spouse name: N/A  . Number of children: 4  . Years of education: N/A    Occupational History  . Not on file.   Social History Main Topics  . Smoking status: Former Smoker    Packs/day: 1.00    Years: 2.00    Types: Cigarettes  . Smokeless tobacco: Not on file  . Alcohol use 1.2 oz/week    2 Glasses of wine per week     Comment: 2 glasses per week  . Drug use: No  . Sexual activity: No   Other Topics Concern  . Not on file   Social History Narrative  . No narrative on file    Family History  Problem Relation Age of Onset  . Heart attack Father   . Heart attack Brother     Review of Systems:  As stated in the HPI and otherwise negative.   BP 122/74   Pulse 89   Ht 5\' 1"  (1.549 m)   Wt 153 lb 6.4 oz (69.6 kg)   SpO2 98%   BMI 28.98 kg/m   Physical Examination: General: Well developed, well nourished, NAD  HEENT: OP clear, mucus membranes moist  SKIN: warm, dry. No rashes. Neuro: No focal deficits  Musculoskeletal: Muscle strength 5/5 all ext  Psychiatric: Mood and affect normal  Neck: No JVD, no carotid bruits, no thyromegaly, no lymphadenopathy.  Lungs:Clear bilaterally, no wheezes, rhonci, crackles Cardiovascular: Regular rate and rhythm. No murmurs, gallops or rubs. Abdomen:Soft. Bowel sounds present. Non-tender.  Extremities: trace bilateral lower extremity edema with chronic venous stasis changes. Pulses are 2 + in the bilateral DP/PT.  Echo June 2015: Left ventricle: The cavity size was normal. Wall thickness was increased in a pattern of moderate LVH. Systolic function was normal. The estimated ejection fraction was in the range of 55% to 60%. Wall motion was normal; there were no regional wall motion abnormalities. Doppler parameters are consistent with abnormal left ventricular relaxation (grade 1 diastolic dysfunction). The E/e&' ratio is between 8-15, suggesting indeterminate LV filling pressure. - Aortic valve: Trileaflet; mildly thickened leaflets. There was no stenosis. There was no  regurgitation. Valve area (Vmax): 2.89 cm^2. - Left atrium: The atrium was normal in size.  EKG:  EKG is not ordered today. The ekg ordered today demonstrates   Recent Labs: 05/20/2016: ALT 14; BUN 13; Creatinine, Ser 0.72; Hemoglobin 11.3; Platelets 348; Potassium 4.4; Sodium 132   Lipid Panel No results found for: CHOL, TRIG, HDL, CHOLHDL, VLDL, LDLCALC, LDLDIRECT   Wt Readings from Last 3 Encounters:  07/28/16 153 lb 6.4 oz (69.6 kg)  07/02/16 150 lb (68 kg)  05/20/16 150 lb (68 kg)     Other studies Reviewed: Additional studies/ records that were reviewed today include: . Review of the above records demonstrates:   Assessment and Plan:   1. Hypertensive heart disease: Moderate LVH by echo 2015 with normal systolic function. She has diastolic dysfunction. Volume status is ok. BP is well controlled. No changes.   2. Chronic diastolic CHF/LE edema: Her LE edema is much improved on the diuretic under the guidance of Dr. Shelia Media. I have nothing to add today. She is doing much better. Continue aladactone. She is asking to see me as needed and have Dr. Shelia Media follow her edema. I agree with this. I can see her at any time that she wishes to come back.   Current medicines are reviewed at length with the patient today.  The patient does not have concerns regarding medicines.  The following changes have been made:  no change  Labs/ tests ordered today include:  No orders of the defined types were placed in this encounter.    Disposition:   FU with me as needed.    Signed, Lauree Chandler, MD 07/28/2016 10:27 AM    [address], [place], [place]  [address] Phone: [phone]; Fax: [phone]

## 2016-07-30 ENCOUNTER — Other Ambulatory Visit: Payer: Self-pay

## 2016-08-06 DIAGNOSIS — E559 Vitamin D deficiency, unspecified: Secondary | ICD-10-CM | POA: Diagnosis not present

## 2016-08-06 DIAGNOSIS — E7800 Pure hypercholesterolemia, unspecified: Secondary | ICD-10-CM | POA: Diagnosis not present

## 2016-08-06 DIAGNOSIS — I10 Essential (primary) hypertension: Secondary | ICD-10-CM | POA: Diagnosis not present

## 2016-08-06 DIAGNOSIS — Z0000 Encounter for general adult medical examination without abnormal findings: Secondary | ICD-10-CM | POA: Diagnosis not present

## 2016-08-06 DIAGNOSIS — M159 Polyosteoarthritis, unspecified: Secondary | ICD-10-CM | POA: Diagnosis not present

## 2016-08-06 DIAGNOSIS — Z791 Long term (current) use of non-steroidal anti-inflammatories (NSAID): Secondary | ICD-10-CM | POA: Diagnosis not present

## 2016-08-06 DIAGNOSIS — M810 Age-related osteoporosis without current pathological fracture: Secondary | ICD-10-CM | POA: Diagnosis not present

## 2016-08-12 DIAGNOSIS — K449 Diaphragmatic hernia without obstruction or gangrene: Secondary | ICD-10-CM | POA: Diagnosis not present

## 2016-08-12 DIAGNOSIS — H6122 Impacted cerumen, left ear: Secondary | ICD-10-CM | POA: Diagnosis not present

## 2016-08-12 DIAGNOSIS — M159 Polyosteoarthritis, unspecified: Secondary | ICD-10-CM | POA: Diagnosis not present

## 2016-08-12 DIAGNOSIS — Z23 Encounter for immunization: Secondary | ICD-10-CM | POA: Diagnosis not present

## 2016-08-12 DIAGNOSIS — K219 Gastro-esophageal reflux disease without esophagitis: Secondary | ICD-10-CM | POA: Diagnosis not present

## 2016-08-12 DIAGNOSIS — I110 Hypertensive heart disease with heart failure: Secondary | ICD-10-CM | POA: Diagnosis not present

## 2016-08-13 DIAGNOSIS — M47817 Spondylosis without myelopathy or radiculopathy, lumbosacral region: Secondary | ICD-10-CM | POA: Diagnosis not present

## 2016-08-13 DIAGNOSIS — M5187 Other intervertebral disc disorders, lumbosacral region: Secondary | ICD-10-CM | POA: Diagnosis not present

## 2016-08-13 DIAGNOSIS — G894 Chronic pain syndrome: Secondary | ICD-10-CM | POA: Diagnosis not present

## 2016-08-13 DIAGNOSIS — M4156 Other secondary scoliosis, lumbar region: Secondary | ICD-10-CM | POA: Diagnosis not present

## 2016-08-18 DIAGNOSIS — M6281 Muscle weakness (generalized): Secondary | ICD-10-CM | POA: Diagnosis not present

## 2016-08-18 DIAGNOSIS — R262 Difficulty in walking, not elsewhere classified: Secondary | ICD-10-CM | POA: Diagnosis not present

## 2016-08-18 DIAGNOSIS — R531 Weakness: Secondary | ICD-10-CM | POA: Diagnosis not present

## 2016-08-21 DIAGNOSIS — R262 Difficulty in walking, not elsewhere classified: Secondary | ICD-10-CM | POA: Diagnosis not present

## 2016-08-21 DIAGNOSIS — M6281 Muscle weakness (generalized): Secondary | ICD-10-CM | POA: Diagnosis not present

## 2016-08-21 DIAGNOSIS — R531 Weakness: Secondary | ICD-10-CM | POA: Diagnosis not present

## 2016-08-28 ENCOUNTER — Encounter: Payer: PRIVATE HEALTH INSURANCE | Admitting: Cardiovascular Disease

## 2016-08-31 DIAGNOSIS — M6281 Muscle weakness (generalized): Secondary | ICD-10-CM | POA: Diagnosis not present

## 2016-08-31 DIAGNOSIS — R531 Weakness: Secondary | ICD-10-CM | POA: Diagnosis not present

## 2016-08-31 DIAGNOSIS — R262 Difficulty in walking, not elsewhere classified: Secondary | ICD-10-CM | POA: Diagnosis not present

## 2016-09-02 DIAGNOSIS — M6281 Muscle weakness (generalized): Secondary | ICD-10-CM | POA: Diagnosis not present

## 2016-09-02 DIAGNOSIS — R269 Unspecified abnormalities of gait and mobility: Secondary | ICD-10-CM | POA: Diagnosis not present

## 2016-09-02 DIAGNOSIS — E875 Hyperkalemia: Secondary | ICD-10-CM | POA: Diagnosis not present

## 2016-09-02 DIAGNOSIS — R262 Difficulty in walking, not elsewhere classified: Secondary | ICD-10-CM | POA: Diagnosis not present

## 2016-09-02 DIAGNOSIS — R531 Weakness: Secondary | ICD-10-CM | POA: Diagnosis not present

## 2016-09-04 DIAGNOSIS — H9012 Conductive hearing loss, unilateral, left ear, with unrestricted hearing on the contralateral side: Secondary | ICD-10-CM | POA: Diagnosis not present

## 2016-09-04 DIAGNOSIS — H6122 Impacted cerumen, left ear: Secondary | ICD-10-CM | POA: Diagnosis not present

## 2016-09-09 DIAGNOSIS — R531 Weakness: Secondary | ICD-10-CM | POA: Diagnosis not present

## 2016-09-09 DIAGNOSIS — M6281 Muscle weakness (generalized): Secondary | ICD-10-CM | POA: Diagnosis not present

## 2016-09-09 DIAGNOSIS — R262 Difficulty in walking, not elsewhere classified: Secondary | ICD-10-CM | POA: Diagnosis not present

## 2016-09-10 DIAGNOSIS — M4156 Other secondary scoliosis, lumbar region: Secondary | ICD-10-CM | POA: Diagnosis not present

## 2016-09-10 DIAGNOSIS — M5187 Other intervertebral disc disorders, lumbosacral region: Secondary | ICD-10-CM | POA: Diagnosis not present

## 2016-09-10 DIAGNOSIS — G894 Chronic pain syndrome: Secondary | ICD-10-CM | POA: Diagnosis not present

## 2016-09-10 DIAGNOSIS — G2581 Restless legs syndrome: Secondary | ICD-10-CM | POA: Diagnosis not present

## 2016-09-10 DIAGNOSIS — K5900 Constipation, unspecified: Secondary | ICD-10-CM | POA: Diagnosis not present

## 2016-09-10 DIAGNOSIS — H353211 Exudative age-related macular degeneration, right eye, with active choroidal neovascularization: Secondary | ICD-10-CM | POA: Diagnosis not present

## 2016-09-10 DIAGNOSIS — G4700 Insomnia, unspecified: Secondary | ICD-10-CM | POA: Diagnosis not present

## 2016-09-10 DIAGNOSIS — M47817 Spondylosis without myelopathy or radiculopathy, lumbosacral region: Secondary | ICD-10-CM | POA: Diagnosis not present

## 2016-09-10 DIAGNOSIS — G4733 Obstructive sleep apnea (adult) (pediatric): Secondary | ICD-10-CM | POA: Diagnosis not present

## 2016-09-11 DIAGNOSIS — R531 Weakness: Secondary | ICD-10-CM | POA: Diagnosis not present

## 2016-09-11 DIAGNOSIS — R262 Difficulty in walking, not elsewhere classified: Secondary | ICD-10-CM | POA: Diagnosis not present

## 2016-09-11 DIAGNOSIS — M6281 Muscle weakness (generalized): Secondary | ICD-10-CM | POA: Diagnosis not present

## 2016-09-15 DIAGNOSIS — M6281 Muscle weakness (generalized): Secondary | ICD-10-CM | POA: Diagnosis not present

## 2016-09-15 DIAGNOSIS — R262 Difficulty in walking, not elsewhere classified: Secondary | ICD-10-CM | POA: Diagnosis not present

## 2016-09-15 DIAGNOSIS — R531 Weakness: Secondary | ICD-10-CM | POA: Diagnosis not present

## 2016-09-17 DIAGNOSIS — R262 Difficulty in walking, not elsewhere classified: Secondary | ICD-10-CM | POA: Diagnosis not present

## 2016-09-17 DIAGNOSIS — R531 Weakness: Secondary | ICD-10-CM | POA: Diagnosis not present

## 2016-09-17 DIAGNOSIS — M6281 Muscle weakness (generalized): Secondary | ICD-10-CM | POA: Diagnosis not present

## 2016-09-21 DIAGNOSIS — R531 Weakness: Secondary | ICD-10-CM | POA: Diagnosis not present

## 2016-09-21 DIAGNOSIS — M6281 Muscle weakness (generalized): Secondary | ICD-10-CM | POA: Diagnosis not present

## 2016-09-21 DIAGNOSIS — R262 Difficulty in walking, not elsewhere classified: Secondary | ICD-10-CM | POA: Diagnosis not present

## 2016-09-23 ENCOUNTER — Institutional Professional Consult (permissible substitution): Payer: Medicare Other | Admitting: Pulmonary Disease

## 2016-09-23 DIAGNOSIS — M6281 Muscle weakness (generalized): Secondary | ICD-10-CM | POA: Diagnosis not present

## 2016-09-23 DIAGNOSIS — R262 Difficulty in walking, not elsewhere classified: Secondary | ICD-10-CM | POA: Diagnosis not present

## 2016-09-23 DIAGNOSIS — R531 Weakness: Secondary | ICD-10-CM | POA: Diagnosis not present

## 2016-09-28 DIAGNOSIS — E875 Hyperkalemia: Secondary | ICD-10-CM | POA: Diagnosis not present

## 2016-09-28 DIAGNOSIS — Z79899 Other long term (current) drug therapy: Secondary | ICD-10-CM | POA: Diagnosis not present

## 2016-09-28 DIAGNOSIS — I110 Hypertensive heart disease with heart failure: Secondary | ICD-10-CM | POA: Diagnosis not present

## 2016-09-29 DIAGNOSIS — R531 Weakness: Secondary | ICD-10-CM | POA: Diagnosis not present

## 2016-09-29 DIAGNOSIS — R262 Difficulty in walking, not elsewhere classified: Secondary | ICD-10-CM | POA: Diagnosis not present

## 2016-09-29 DIAGNOSIS — M6281 Muscle weakness (generalized): Secondary | ICD-10-CM | POA: Diagnosis not present

## 2016-10-01 DIAGNOSIS — R531 Weakness: Secondary | ICD-10-CM | POA: Diagnosis not present

## 2016-10-01 DIAGNOSIS — R262 Difficulty in walking, not elsewhere classified: Secondary | ICD-10-CM | POA: Diagnosis not present

## 2016-10-01 DIAGNOSIS — M6281 Muscle weakness (generalized): Secondary | ICD-10-CM | POA: Diagnosis not present

## 2016-10-05 DIAGNOSIS — M6281 Muscle weakness (generalized): Secondary | ICD-10-CM | POA: Diagnosis not present

## 2016-10-05 DIAGNOSIS — R531 Weakness: Secondary | ICD-10-CM | POA: Diagnosis not present

## 2016-10-05 DIAGNOSIS — R262 Difficulty in walking, not elsewhere classified: Secondary | ICD-10-CM | POA: Diagnosis not present

## 2016-10-06 ENCOUNTER — Ambulatory Visit (INDEPENDENT_AMBULATORY_CARE_PROVIDER_SITE_OTHER): Payer: Medicare Other | Admitting: Pulmonary Disease

## 2016-10-06 ENCOUNTER — Encounter: Payer: Self-pay | Admitting: Pulmonary Disease

## 2016-10-06 DIAGNOSIS — G4733 Obstructive sleep apnea (adult) (pediatric): Secondary | ICD-10-CM | POA: Insufficient documentation

## 2016-10-06 NOTE — Assessment & Plan Note (Signed)
We discussed CPAP therapy and decided to hold off. She does not have any symptoms of daytime somnolence and fatigue. She is a fall risk-she is worried about adjusting to the machine and falling at night  Please call us again if you develop -Excessive somnolence -Witnessed periods of stopping breathing in your sleep -Increasing in pain medication dosing   I do not think alternative modes of treatment like oral appliance would be tolerated either OSA is very common in the elderly-cardiovascular implications are debatable at her age

## 2016-10-06 NOTE — Patient Instructions (Signed)
We discussed CPAP therapy and decided to hold off Please call us again if you develop -Excessive somnolence -Witnessed periods of stopping breathing in your sleep -Increasing in pain medication dosing

## 2016-10-06 NOTE — Progress Notes (Signed)
Subjective:    Patient ID: [name], female    DOB: 07/31/1926, 82 y.o.   MRN: [id number]  HPI  Chief Complaint  Patient presents with  . Sleep Consult    Referred by Dr. Corinna Capra, Pt. has had a sleep study 07/02/16 at [facility], Pt. has witnessed apneas, Pt. c/o of trouble falling asleep, Pt. has some daytime sleepiness   82 year old with chronic pain referred for evaluation of sleep-disordered breathing. She has severe osteoporosis and vertebral fractures and chronic pain on this basis. She is maintained on oxycodone for many years and her pain specialist has now decreased her to hydrocodone 3 times daily for the past 8 months Neurontin was added recently and this has helped her sleep better  a sleep study was ordered. This showed poor sleep efficiency. During the baseline period of 144 minutes of sleep, AHI was 32/hour mostly obstructive events without centrals with lowest desaturation of 80%. This was corrected by CPAP of 9 cm with a large full facemask. However the patient felt that she did not sleep well during the study and did not feel rested the next morning.  She is accompanied by her daughter today who does not live with her but provides and corroborates the history.  Epworth sleepiness score is 3 and she denies daytime naps. She is able to function during the day and read and interact with family members. Bedtime is between 10 and 10:30 PM, sleep latency is about 30 minutes, she sleeps on her side with one pillow, reports one to 2 nocturnal awakenings including nocturia and is out of bed by 7 AM feeling bad mostly due to pain with occasional dryness of mouth and denies headache. There is no history suggestive of cataplexy, sleep paralysis or parasomnias  I understand that Neurontin was added to her regimen due to insomnia and restless legs-however sleep study does not show any evidence of periodic leg movements    Past Medical History:  Diagnosis Date  . Actinic  keratosis   . Allergic rhinitis   . Back pain   . Chronic constipation   . GERD (gastroesophageal reflux disease)   . Hyperlipidemia   . Hypertension   . Hyponatremia   . Leg edema   . Macular degeneration   . Mitral regurgitation    mild by echo 2003  . Osteoarthritis   . Osteoarthrosis   . Osteoporosis   . Tenosynovitis    myxomatous   Past Surgical History:  Procedure Laterality Date  . CARPAL TUNNEL RELEASE     bilateral  . hernial repain    . INGUINAL HERNIA REPAIR     laparoscopic preperitoneal repair of bilateral inguinal hernias  . REPLACEMENT TOTAL KNEE BILATERAL    . REVERSE SHOULDER ARTHROPLASTY Right 05/08/2014   Procedure: REVERSE SHOULDER ARTHROPLASTY RIGHT ;  Surgeon: Marin Shutter, MD;  Location: [place];  Service: Orthopedics;  Laterality: Right;  . SYNOVECTOMY     of right long finger  . tonisellectomy    . tonisillectomy    . TONSILLECTOMY      Allergies  Allergen Reactions  . Aspirin     Unknown  . Boniva [Ibandronic Acid]     Unknown  . Morphine     Unknown  . Nitrofuran Derivatives     Unknown  . Penicillins     Unknown reaction . Received Ancef without problem   . Sulfonamide Derivatives     Unknown     Social History  Social History  . Marital status: Divorced    Spouse name: N/A  . Number of children: 4  . Years of education: N/A   Occupational History  . Not on file.   Social History Main Topics  . Smoking status: Former Smoker    Packs/day: 1.00    Years: 2.00    Types: Cigarettes    Quit date: 12/07/1967  . Smokeless tobacco: Never Used  . Alcohol use 1.2 oz/week    2 Glasses of wine per week     Comment: 2 glasses per week  . Drug use: No  . Sexual activity: No   Other Topics Concern  . Not on file   Social History Narrative  . No narrative on file    Family History  Problem Relation Age of Onset  . Heart attack Father   . Heart attack Brother       Review of Systems  Constitutional: Negative.   Negative for fever and unexpected weight change.  HENT: Positive for congestion. Negative for dental problem, ear pain, nosebleeds, postnasal drip, rhinorrhea, sinus pressure, sneezing, sore throat and trouble swallowing.   Eyes: Negative.  Negative for redness and itching.  Respiratory: Positive for cough. Negative for chest tightness, shortness of breath and wheezing.   Cardiovascular: Positive for leg swelling. Negative for palpitations.  Gastrointestinal: Negative.  Negative for nausea and vomiting.  Endocrine: Negative.   Genitourinary: Negative.  Negative for dysuria.  Musculoskeletal: Positive for joint swelling.  Skin: Negative.  Negative for rash.  Allergic/Immunologic: Positive for environmental allergies.  Neurological: Positive for headaches.  Hematological: Bruises/bleeds easily.  Psychiatric/Behavioral: Negative.  Negative for dysphoric mood. The patient is not nervous/anxious.        Objective:   Physical Exam  Gen. Pleasant, elderly, well-nourished, in no distress, normal affect ENT - no lesions, no post nasal drip Neck: No JVD, no thyromegaly, no carotid bruits Lungs: no use of accessory muscles, no dullness to percussion, clear without rales or rhonchi  Cardiovascular: Rhythm regular, heart sounds  normal, no murmurs or gallops, no peripheral edema Abdomen: soft and non-tender, no hepatosplenomegaly, BS normal. Musculoskeletal: No deformities, no cyanosis or clubbing Neuro:  alert, non focal       Assessment & Plan:

## 2016-10-07 DIAGNOSIS — M4156 Other secondary scoliosis, lumbar region: Secondary | ICD-10-CM | POA: Diagnosis not present

## 2016-10-07 DIAGNOSIS — G894 Chronic pain syndrome: Secondary | ICD-10-CM | POA: Diagnosis not present

## 2016-10-07 DIAGNOSIS — M47817 Spondylosis without myelopathy or radiculopathy, lumbosacral region: Secondary | ICD-10-CM | POA: Diagnosis not present

## 2016-10-07 DIAGNOSIS — M5187 Other intervertebral disc disorders, lumbosacral region: Secondary | ICD-10-CM | POA: Diagnosis not present

## 2016-10-08 DIAGNOSIS — R262 Difficulty in walking, not elsewhere classified: Secondary | ICD-10-CM | POA: Diagnosis not present

## 2016-10-08 DIAGNOSIS — M6281 Muscle weakness (generalized): Secondary | ICD-10-CM | POA: Diagnosis not present

## 2016-10-08 DIAGNOSIS — R531 Weakness: Secondary | ICD-10-CM | POA: Diagnosis not present

## 2016-10-26 DIAGNOSIS — M6281 Muscle weakness (generalized): Secondary | ICD-10-CM | POA: Diagnosis not present

## 2016-10-26 DIAGNOSIS — N390 Urinary tract infection, site not specified: Secondary | ICD-10-CM | POA: Diagnosis not present

## 2016-10-26 DIAGNOSIS — R531 Weakness: Secondary | ICD-10-CM | POA: Diagnosis not present

## 2016-10-26 DIAGNOSIS — R262 Difficulty in walking, not elsewhere classified: Secondary | ICD-10-CM | POA: Diagnosis not present

## 2016-10-29 DIAGNOSIS — H353211 Exudative age-related macular degeneration, right eye, with active choroidal neovascularization: Secondary | ICD-10-CM | POA: Diagnosis not present

## 2016-11-03 DIAGNOSIS — R531 Weakness: Secondary | ICD-10-CM | POA: Diagnosis not present

## 2016-11-03 DIAGNOSIS — M6281 Muscle weakness (generalized): Secondary | ICD-10-CM | POA: Diagnosis not present

## 2016-11-03 DIAGNOSIS — R262 Difficulty in walking, not elsewhere classified: Secondary | ICD-10-CM | POA: Diagnosis not present

## 2016-12-01 DIAGNOSIS — K429 Umbilical hernia without obstruction or gangrene: Secondary | ICD-10-CM | POA: Diagnosis not present

## 2016-12-10 DIAGNOSIS — M4156 Other secondary scoliosis, lumbar region: Secondary | ICD-10-CM | POA: Diagnosis not present

## 2016-12-10 DIAGNOSIS — M47817 Spondylosis without myelopathy or radiculopathy, lumbosacral region: Secondary | ICD-10-CM | POA: Diagnosis not present

## 2016-12-10 DIAGNOSIS — M5187 Other intervertebral disc disorders, lumbosacral region: Secondary | ICD-10-CM | POA: Diagnosis not present

## 2016-12-10 DIAGNOSIS — G894 Chronic pain syndrome: Secondary | ICD-10-CM | POA: Diagnosis not present

## 2016-12-22 DIAGNOSIS — H353211 Exudative age-related macular degeneration, right eye, with active choroidal neovascularization: Secondary | ICD-10-CM | POA: Diagnosis not present

## 2016-12-23 ENCOUNTER — Ambulatory Visit (INDEPENDENT_AMBULATORY_CARE_PROVIDER_SITE_OTHER): Payer: Medicare Other | Admitting: Podiatry

## 2016-12-23 ENCOUNTER — Encounter: Payer: Self-pay | Admitting: Podiatry

## 2016-12-23 VITALS — BP 117/73 | HR 92 | Resp 18

## 2016-12-23 DIAGNOSIS — M79675 Pain in left toe(s): Secondary | ICD-10-CM

## 2016-12-23 DIAGNOSIS — M79674 Pain in right toe(s): Secondary | ICD-10-CM

## 2016-12-23 DIAGNOSIS — B351 Tinea unguium: Secondary | ICD-10-CM | POA: Diagnosis not present

## 2016-12-24 NOTE — Progress Notes (Signed)
Patient ID: [name], female   DOB: January 21, 1926, 83 y.o.   MRN: [id number]   Subjective: This patient presents today complaining of uncomfortable toenails and requests toenail debridement. The patient daughter is present in the treatment room. The patient was last seen for similar service in 06/08/2016 by Dr. Paulla Dolly.  Objective: Orientated 3 DP and PT pulses 2/4 bilaterally Capillary reflex immediate bilaterally Sensation to 10 g monofilament wire intact 4/5 right 5/5 left Vibratory sensation nonreactive bilaterally Ankle reflex reactive bilaterally No open skin lesions bilaterally Atrophic skin bilaterally The toenails elongated, brittle, deformed, discolored and tender direct palpation 6-10 Patient walks slowly with a walker Manual motor testing dorsi flexion, plantar flexion 5/5 bilaterally  Assessment: Symptomatic onychomycoses 6-10  Plan: Debridement of toenails 6-10 mechanically and electrically without any bleeding  Reappoint 4 months

## 2017-02-01 DIAGNOSIS — G2581 Restless legs syndrome: Secondary | ICD-10-CM | POA: Diagnosis not present

## 2017-02-04 DIAGNOSIS — M4156 Other secondary scoliosis, lumbar region: Secondary | ICD-10-CM | POA: Diagnosis not present

## 2017-02-04 DIAGNOSIS — M47817 Spondylosis without myelopathy or radiculopathy, lumbosacral region: Secondary | ICD-10-CM | POA: Diagnosis not present

## 2017-02-04 DIAGNOSIS — M5187 Other intervertebral disc disorders, lumbosacral region: Secondary | ICD-10-CM | POA: Diagnosis not present

## 2017-02-04 DIAGNOSIS — G894 Chronic pain syndrome: Secondary | ICD-10-CM | POA: Diagnosis not present

## 2017-02-11 DIAGNOSIS — H353223 Exudative age-related macular degeneration, left eye, with inactive scar: Secondary | ICD-10-CM | POA: Diagnosis not present

## 2017-02-11 DIAGNOSIS — H353211 Exudative age-related macular degeneration, right eye, with active choroidal neovascularization: Secondary | ICD-10-CM | POA: Diagnosis not present

## 2017-04-01 DIAGNOSIS — H353211 Exudative age-related macular degeneration, right eye, with active choroidal neovascularization: Secondary | ICD-10-CM | POA: Diagnosis not present

## 2017-04-01 DIAGNOSIS — H353223 Exudative age-related macular degeneration, left eye, with inactive scar: Secondary | ICD-10-CM | POA: Diagnosis not present

## 2017-04-06 DIAGNOSIS — M4156 Other secondary scoliosis, lumbar region: Secondary | ICD-10-CM | POA: Diagnosis not present

## 2017-04-06 DIAGNOSIS — G894 Chronic pain syndrome: Secondary | ICD-10-CM | POA: Diagnosis not present

## 2017-04-06 DIAGNOSIS — M5187 Other intervertebral disc disorders, lumbosacral region: Secondary | ICD-10-CM | POA: Diagnosis not present

## 2017-04-06 DIAGNOSIS — M47817 Spondylosis without myelopathy or radiculopathy, lumbosacral region: Secondary | ICD-10-CM | POA: Diagnosis not present

## 2017-04-13 ENCOUNTER — Encounter: Payer: Self-pay | Admitting: Podiatry

## 2017-04-13 ENCOUNTER — Ambulatory Visit (INDEPENDENT_AMBULATORY_CARE_PROVIDER_SITE_OTHER): Payer: Medicare Other | Admitting: Podiatry

## 2017-04-13 DIAGNOSIS — M79675 Pain in left toe(s): Secondary | ICD-10-CM

## 2017-04-13 DIAGNOSIS — B351 Tinea unguium: Secondary | ICD-10-CM | POA: Diagnosis not present

## 2017-04-13 DIAGNOSIS — M79674 Pain in right toe(s): Secondary | ICD-10-CM

## 2017-04-13 NOTE — Progress Notes (Signed)
Patient ID: [name], female   DOB: 1926-08-29, 83 y.o.   MRN: [id number]    Subjective: This patient presents today complaining of uncomfortable toenails and requests toenail debridement. The patient daughter is present in the treatment room.   Objective: Orientated 3 DP and PT pulses 2/4 bilaterally Capillary reflex immediate bilaterally Sensation to 10 g monofilament wire intact 4/5 right 5/5 left Vibratory sensation nonreactive bilaterally Ankle reflex reactive bilaterally No open skin lesions bilaterally Atrophic skin bilaterally Absent hair growth bilaterally The toenails elongated, brittle, deformed, discolored and tender direct palpation 6-10 Patient walks slowly with a walker Manual motor testing dorsi flexion, plantar flexion 5/5 bilaterally  Assessment: Symptomatic onychomycoses 6-10  Plan: Debridement of toenails 6-10 mechanically and electrically without any bleeding  Reappoint 4 months

## 2017-04-22 DIAGNOSIS — N390 Urinary tract infection, site not specified: Secondary | ICD-10-CM | POA: Diagnosis not present

## 2017-04-22 DIAGNOSIS — R8290 Unspecified abnormal findings in urine: Secondary | ICD-10-CM | POA: Diagnosis not present

## 2017-05-20 DIAGNOSIS — H353211 Exudative age-related macular degeneration, right eye, with active choroidal neovascularization: Secondary | ICD-10-CM | POA: Diagnosis not present

## 2017-05-20 DIAGNOSIS — Z961 Presence of intraocular lens: Secondary | ICD-10-CM | POA: Diagnosis not present

## 2017-05-20 DIAGNOSIS — H353223 Exudative age-related macular degeneration, left eye, with inactive scar: Secondary | ICD-10-CM | POA: Diagnosis not present

## 2017-05-27 DIAGNOSIS — M810 Age-related osteoporosis without current pathological fracture: Secondary | ICD-10-CM | POA: Diagnosis not present

## 2017-06-01 DIAGNOSIS — G894 Chronic pain syndrome: Secondary | ICD-10-CM | POA: Diagnosis not present

## 2017-06-01 DIAGNOSIS — M4156 Other secondary scoliosis, lumbar region: Secondary | ICD-10-CM | POA: Diagnosis not present

## 2017-06-01 DIAGNOSIS — M5187 Other intervertebral disc disorders, lumbosacral region: Secondary | ICD-10-CM | POA: Diagnosis not present

## 2017-06-01 DIAGNOSIS — M47817 Spondylosis without myelopathy or radiculopathy, lumbosacral region: Secondary | ICD-10-CM | POA: Diagnosis not present

## 2017-07-08 DIAGNOSIS — H353211 Exudative age-related macular degeneration, right eye, with active choroidal neovascularization: Secondary | ICD-10-CM | POA: Diagnosis not present

## 2017-07-27 DIAGNOSIS — B373 Candidiasis of vulva and vagina: Secondary | ICD-10-CM | POA: Diagnosis not present

## 2017-07-27 DIAGNOSIS — N952 Postmenopausal atrophic vaginitis: Secondary | ICD-10-CM | POA: Diagnosis not present

## 2017-07-27 DIAGNOSIS — M47817 Spondylosis without myelopathy or radiculopathy, lumbosacral region: Secondary | ICD-10-CM | POA: Diagnosis not present

## 2017-07-27 DIAGNOSIS — N390 Urinary tract infection, site not specified: Secondary | ICD-10-CM | POA: Diagnosis not present

## 2017-07-27 DIAGNOSIS — Z01419 Encounter for gynecological examination (general) (routine) without abnormal findings: Secondary | ICD-10-CM | POA: Diagnosis not present

## 2017-07-27 DIAGNOSIS — M5187 Other intervertebral disc disorders, lumbosacral region: Secondary | ICD-10-CM | POA: Diagnosis not present

## 2017-07-27 DIAGNOSIS — M4156 Other secondary scoliosis, lumbar region: Secondary | ICD-10-CM | POA: Diagnosis not present

## 2017-07-27 DIAGNOSIS — G894 Chronic pain syndrome: Secondary | ICD-10-CM | POA: Diagnosis not present

## 2017-08-10 ENCOUNTER — Encounter: Payer: Self-pay | Admitting: Podiatry

## 2017-08-10 ENCOUNTER — Ambulatory Visit (INDEPENDENT_AMBULATORY_CARE_PROVIDER_SITE_OTHER): Payer: Medicare Other | Admitting: Podiatry

## 2017-08-10 DIAGNOSIS — B351 Tinea unguium: Secondary | ICD-10-CM | POA: Diagnosis not present

## 2017-08-10 DIAGNOSIS — M79674 Pain in right toe(s): Secondary | ICD-10-CM

## 2017-08-10 DIAGNOSIS — M79675 Pain in left toe(s): Secondary | ICD-10-CM

## 2017-08-10 NOTE — Progress Notes (Signed)
Patient ID: MAHOGANY TORRANCE, female   DOB: March 26, 1926, 83 y.o.   MRN: [id number]    Subjective: This patient presents today complaining of uncomfortable toenails and requests toenail debridement. The patient daughter is present in the treatment room.   Objective: Orientated 3 DP and PT pulses 2/4 bilaterally Capillary reflex immediate bilaterally Sensation to 10 g monofilament wire intact 4/5 right 5/5 left Vibratory sensation nonreactive bilaterally Ankle reflex reactive bilaterally No open skin lesions bilaterally Atrophic skin bilaterally Absent hair growth bilaterally The toenails elongated, brittle, deformed, discolored and tender direct palpation 6-10 Patient walks slowly with a walker Manual motor testing dorsi flexion, plantar flexion 5/5 bilaterally  Assessment: Symptomatic onychomycoses 6-10  Plan: Debridement of toenails 6-10 mechanically and electrically without any bleeding  Reappoint 4 months

## 2017-08-16 DIAGNOSIS — E7800 Pure hypercholesterolemia, unspecified: Secondary | ICD-10-CM | POA: Diagnosis not present

## 2017-08-16 DIAGNOSIS — Z0000 Encounter for general adult medical examination without abnormal findings: Secondary | ICD-10-CM | POA: Diagnosis not present

## 2017-08-16 DIAGNOSIS — N390 Urinary tract infection, site not specified: Secondary | ICD-10-CM | POA: Diagnosis not present

## 2017-08-16 DIAGNOSIS — Z23 Encounter for immunization: Secondary | ICD-10-CM | POA: Diagnosis not present

## 2017-08-16 DIAGNOSIS — I110 Hypertensive heart disease with heart failure: Secondary | ICD-10-CM | POA: Diagnosis not present

## 2017-08-16 DIAGNOSIS — Z791 Long term (current) use of non-steroidal anti-inflammatories (NSAID): Secondary | ICD-10-CM | POA: Diagnosis not present

## 2017-08-16 DIAGNOSIS — I10 Essential (primary) hypertension: Secondary | ICD-10-CM | POA: Diagnosis not present

## 2017-08-19 DIAGNOSIS — K449 Diaphragmatic hernia without obstruction or gangrene: Secondary | ICD-10-CM | POA: Diagnosis not present

## 2017-08-19 DIAGNOSIS — K649 Unspecified hemorrhoids: Secondary | ICD-10-CM | POA: Diagnosis not present

## 2017-08-19 DIAGNOSIS — K219 Gastro-esophageal reflux disease without esophagitis: Secondary | ICD-10-CM | POA: Diagnosis not present

## 2017-08-19 DIAGNOSIS — I10 Essential (primary) hypertension: Secondary | ICD-10-CM | POA: Diagnosis not present

## 2017-08-23 DIAGNOSIS — I509 Heart failure, unspecified: Secondary | ICD-10-CM | POA: Diagnosis not present

## 2017-08-23 DIAGNOSIS — R2689 Other abnormalities of gait and mobility: Secondary | ICD-10-CM | POA: Diagnosis not present

## 2017-08-23 DIAGNOSIS — M810 Age-related osteoporosis without current pathological fracture: Secondary | ICD-10-CM | POA: Diagnosis not present

## 2017-08-23 DIAGNOSIS — I110 Hypertensive heart disease with heart failure: Secondary | ICD-10-CM | POA: Diagnosis not present

## 2017-08-23 DIAGNOSIS — M159 Polyosteoarthritis, unspecified: Secondary | ICD-10-CM | POA: Diagnosis not present

## 2017-08-25 DIAGNOSIS — M810 Age-related osteoporosis without current pathological fracture: Secondary | ICD-10-CM | POA: Diagnosis not present

## 2017-08-25 DIAGNOSIS — I509 Heart failure, unspecified: Secondary | ICD-10-CM | POA: Diagnosis not present

## 2017-08-25 DIAGNOSIS — M159 Polyosteoarthritis, unspecified: Secondary | ICD-10-CM | POA: Diagnosis not present

## 2017-08-25 DIAGNOSIS — I110 Hypertensive heart disease with heart failure: Secondary | ICD-10-CM | POA: Diagnosis not present

## 2017-08-25 DIAGNOSIS — R2689 Other abnormalities of gait and mobility: Secondary | ICD-10-CM | POA: Diagnosis not present

## 2017-08-30 DIAGNOSIS — I509 Heart failure, unspecified: Secondary | ICD-10-CM | POA: Diagnosis not present

## 2017-08-30 DIAGNOSIS — M159 Polyosteoarthritis, unspecified: Secondary | ICD-10-CM | POA: Diagnosis not present

## 2017-08-30 DIAGNOSIS — I110 Hypertensive heart disease with heart failure: Secondary | ICD-10-CM | POA: Diagnosis not present

## 2017-08-30 DIAGNOSIS — R2689 Other abnormalities of gait and mobility: Secondary | ICD-10-CM | POA: Diagnosis not present

## 2017-08-30 DIAGNOSIS — M810 Age-related osteoporosis without current pathological fracture: Secondary | ICD-10-CM | POA: Diagnosis not present

## 2017-09-01 DIAGNOSIS — I509 Heart failure, unspecified: Secondary | ICD-10-CM | POA: Diagnosis not present

## 2017-09-01 DIAGNOSIS — M159 Polyosteoarthritis, unspecified: Secondary | ICD-10-CM | POA: Diagnosis not present

## 2017-09-01 DIAGNOSIS — I110 Hypertensive heart disease with heart failure: Secondary | ICD-10-CM | POA: Diagnosis not present

## 2017-09-01 DIAGNOSIS — M810 Age-related osteoporosis without current pathological fracture: Secondary | ICD-10-CM | POA: Diagnosis not present

## 2017-09-01 DIAGNOSIS — R2689 Other abnormalities of gait and mobility: Secondary | ICD-10-CM | POA: Diagnosis not present

## 2017-09-02 DIAGNOSIS — H353211 Exudative age-related macular degeneration, right eye, with active choroidal neovascularization: Secondary | ICD-10-CM | POA: Diagnosis not present

## 2017-09-08 DIAGNOSIS — I110 Hypertensive heart disease with heart failure: Secondary | ICD-10-CM | POA: Diagnosis not present

## 2017-09-08 DIAGNOSIS — I509 Heart failure, unspecified: Secondary | ICD-10-CM | POA: Diagnosis not present

## 2017-09-08 DIAGNOSIS — M810 Age-related osteoporosis without current pathological fracture: Secondary | ICD-10-CM | POA: Diagnosis not present

## 2017-09-08 DIAGNOSIS — M159 Polyosteoarthritis, unspecified: Secondary | ICD-10-CM | POA: Diagnosis not present

## 2017-09-08 DIAGNOSIS — R2689 Other abnormalities of gait and mobility: Secondary | ICD-10-CM | POA: Diagnosis not present

## 2017-09-13 DIAGNOSIS — I10 Essential (primary) hypertension: Secondary | ICD-10-CM | POA: Diagnosis not present

## 2017-09-13 DIAGNOSIS — S81812A Laceration without foreign body, left lower leg, initial encounter: Secondary | ICD-10-CM | POA: Diagnosis not present

## 2017-09-13 DIAGNOSIS — Z87891 Personal history of nicotine dependence: Secondary | ICD-10-CM | POA: Diagnosis not present

## 2017-09-13 DIAGNOSIS — S81802A Unspecified open wound, left lower leg, initial encounter: Secondary | ICD-10-CM | POA: Diagnosis not present

## 2017-09-13 DIAGNOSIS — W1839XA Other fall on same level, initial encounter: Secondary | ICD-10-CM | POA: Diagnosis not present

## 2017-09-13 DIAGNOSIS — Z96653 Presence of artificial knee joint, bilateral: Secondary | ICD-10-CM | POA: Diagnosis not present

## 2017-09-13 DIAGNOSIS — Y998 Other external cause status: Secondary | ICD-10-CM | POA: Diagnosis not present

## 2017-09-27 DIAGNOSIS — G894 Chronic pain syndrome: Secondary | ICD-10-CM | POA: Diagnosis not present

## 2017-09-27 DIAGNOSIS — M47817 Spondylosis without myelopathy or radiculopathy, lumbosacral region: Secondary | ICD-10-CM | POA: Diagnosis not present

## 2017-09-27 DIAGNOSIS — M4156 Other secondary scoliosis, lumbar region: Secondary | ICD-10-CM | POA: Diagnosis not present

## 2017-09-27 DIAGNOSIS — M5187 Other intervertebral disc disorders, lumbosacral region: Secondary | ICD-10-CM | POA: Diagnosis not present

## 2017-09-28 DIAGNOSIS — M159 Polyosteoarthritis, unspecified: Secondary | ICD-10-CM | POA: Diagnosis not present

## 2017-09-28 DIAGNOSIS — I110 Hypertensive heart disease with heart failure: Secondary | ICD-10-CM | POA: Diagnosis not present

## 2017-09-28 DIAGNOSIS — R2689 Other abnormalities of gait and mobility: Secondary | ICD-10-CM | POA: Diagnosis not present

## 2017-09-28 DIAGNOSIS — I509 Heart failure, unspecified: Secondary | ICD-10-CM | POA: Diagnosis not present

## 2017-09-28 DIAGNOSIS — M810 Age-related osteoporosis without current pathological fracture: Secondary | ICD-10-CM | POA: Diagnosis not present

## 2017-10-08 DIAGNOSIS — R2689 Other abnormalities of gait and mobility: Secondary | ICD-10-CM | POA: Diagnosis not present

## 2017-10-08 DIAGNOSIS — M159 Polyosteoarthritis, unspecified: Secondary | ICD-10-CM | POA: Diagnosis not present

## 2017-10-08 DIAGNOSIS — I110 Hypertensive heart disease with heart failure: Secondary | ICD-10-CM | POA: Diagnosis not present

## 2017-10-08 DIAGNOSIS — I509 Heart failure, unspecified: Secondary | ICD-10-CM | POA: Diagnosis not present

## 2017-10-28 DIAGNOSIS — H353231 Exudative age-related macular degeneration, bilateral, with active choroidal neovascularization: Secondary | ICD-10-CM | POA: Diagnosis not present

## 2017-10-28 DIAGNOSIS — H35361 Drusen (degenerative) of macula, right eye: Secondary | ICD-10-CM | POA: Diagnosis not present

## 2017-10-28 DIAGNOSIS — H353211 Exudative age-related macular degeneration, right eye, with active choroidal neovascularization: Secondary | ICD-10-CM | POA: Diagnosis not present

## 2017-11-24 DIAGNOSIS — M810 Age-related osteoporosis without current pathological fracture: Secondary | ICD-10-CM | POA: Diagnosis not present

## 2017-11-24 DIAGNOSIS — I110 Hypertensive heart disease with heart failure: Secondary | ICD-10-CM | POA: Diagnosis not present

## 2017-11-24 DIAGNOSIS — M159 Polyosteoarthritis, unspecified: Secondary | ICD-10-CM | POA: Diagnosis not present

## 2017-11-24 DIAGNOSIS — R2689 Other abnormalities of gait and mobility: Secondary | ICD-10-CM | POA: Diagnosis not present

## 2017-11-24 DIAGNOSIS — I509 Heart failure, unspecified: Secondary | ICD-10-CM | POA: Diagnosis not present

## 2017-11-25 DIAGNOSIS — M5187 Other intervertebral disc disorders, lumbosacral region: Secondary | ICD-10-CM | POA: Diagnosis not present

## 2017-11-25 DIAGNOSIS — M47817 Spondylosis without myelopathy or radiculopathy, lumbosacral region: Secondary | ICD-10-CM | POA: Diagnosis not present

## 2017-11-25 DIAGNOSIS — G894 Chronic pain syndrome: Secondary | ICD-10-CM | POA: Diagnosis not present

## 2017-11-25 DIAGNOSIS — M4156 Other secondary scoliosis, lumbar region: Secondary | ICD-10-CM | POA: Diagnosis not present

## 2017-12-13 ENCOUNTER — Ambulatory Visit (INDEPENDENT_AMBULATORY_CARE_PROVIDER_SITE_OTHER): Payer: Medicare Other | Admitting: Podiatry

## 2017-12-13 ENCOUNTER — Ambulatory Visit: Payer: Medicare Other | Admitting: Podiatry

## 2017-12-13 ENCOUNTER — Encounter: Payer: Self-pay | Admitting: Podiatry

## 2017-12-13 ENCOUNTER — Ambulatory Visit (INDEPENDENT_AMBULATORY_CARE_PROVIDER_SITE_OTHER): Payer: Medicare Other

## 2017-12-13 DIAGNOSIS — R609 Edema, unspecified: Secondary | ICD-10-CM

## 2017-12-13 DIAGNOSIS — M79675 Pain in left toe(s): Secondary | ICD-10-CM | POA: Diagnosis not present

## 2017-12-13 DIAGNOSIS — B351 Tinea unguium: Secondary | ICD-10-CM

## 2017-12-13 DIAGNOSIS — M79674 Pain in right toe(s): Secondary | ICD-10-CM

## 2017-12-16 DIAGNOSIS — H35361 Drusen (degenerative) of macula, right eye: Secondary | ICD-10-CM | POA: Diagnosis not present

## 2017-12-16 DIAGNOSIS — H353211 Exudative age-related macular degeneration, right eye, with active choroidal neovascularization: Secondary | ICD-10-CM | POA: Diagnosis not present

## 2017-12-16 DIAGNOSIS — H353231 Exudative age-related macular degeneration, bilateral, with active choroidal neovascularization: Secondary | ICD-10-CM | POA: Diagnosis not present

## 2018-01-25 DIAGNOSIS — M47817 Spondylosis without myelopathy or radiculopathy, lumbosacral region: Secondary | ICD-10-CM | POA: Diagnosis not present

## 2018-01-25 DIAGNOSIS — M5187 Other intervertebral disc disorders, lumbosacral region: Secondary | ICD-10-CM | POA: Diagnosis not present

## 2018-01-25 DIAGNOSIS — G894 Chronic pain syndrome: Secondary | ICD-10-CM | POA: Diagnosis not present

## 2018-01-25 DIAGNOSIS — M4156 Other secondary scoliosis, lumbar region: Secondary | ICD-10-CM | POA: Diagnosis not present

## 2018-02-03 DIAGNOSIS — R197 Diarrhea, unspecified: Secondary | ICD-10-CM | POA: Diagnosis not present

## 2018-02-03 DIAGNOSIS — H353211 Exudative age-related macular degeneration, right eye, with active choroidal neovascularization: Secondary | ICD-10-CM | POA: Diagnosis not present

## 2018-02-03 DIAGNOSIS — N390 Urinary tract infection, site not specified: Secondary | ICD-10-CM | POA: Diagnosis not present

## 2018-02-03 DIAGNOSIS — R195 Other fecal abnormalities: Secondary | ICD-10-CM | POA: Diagnosis not present

## 2018-02-03 DIAGNOSIS — H353223 Exudative age-related macular degeneration, left eye, with inactive scar: Secondary | ICD-10-CM | POA: Diagnosis not present

## 2018-02-03 DIAGNOSIS — D649 Anemia, unspecified: Secondary | ICD-10-CM | POA: Diagnosis not present

## 2018-02-03 DIAGNOSIS — R1030 Lower abdominal pain, unspecified: Secondary | ICD-10-CM | POA: Diagnosis not present

## 2018-02-28 DIAGNOSIS — M5187 Other intervertebral disc disorders, lumbosacral region: Secondary | ICD-10-CM | POA: Diagnosis not present

## 2018-02-28 DIAGNOSIS — G894 Chronic pain syndrome: Secondary | ICD-10-CM | POA: Diagnosis not present

## 2018-02-28 DIAGNOSIS — M4156 Other secondary scoliosis, lumbar region: Secondary | ICD-10-CM | POA: Diagnosis not present

## 2018-02-28 DIAGNOSIS — M47817 Spondylosis without myelopathy or radiculopathy, lumbosacral region: Secondary | ICD-10-CM | POA: Diagnosis not present

## 2018-03-14 ENCOUNTER — Ambulatory Visit (INDEPENDENT_AMBULATORY_CARE_PROVIDER_SITE_OTHER): Payer: Medicare Other | Admitting: Podiatry

## 2018-03-14 DIAGNOSIS — M79675 Pain in left toe(s): Secondary | ICD-10-CM | POA: Diagnosis not present

## 2018-03-14 DIAGNOSIS — B351 Tinea unguium: Secondary | ICD-10-CM | POA: Diagnosis not present

## 2018-03-14 DIAGNOSIS — M79674 Pain in right toe(s): Secondary | ICD-10-CM

## 2018-03-18 NOTE — Progress Notes (Signed)
   SUBJECTIVE Patient presents to office today complaining of elongated, thickened nails that cause pain while ambulating in shoes. She is unable to trim her own nails. Patient is here for further evaluation and treatment.  Past Medical History:  Diagnosis Date  . Actinic keratosis   . Allergic rhinitis   . Back pain   . Chronic constipation   . GERD (gastroesophageal reflux disease)   . Hyperlipidemia   . Hypertension   . Hyponatremia   . Leg edema   . Macular degeneration   . Mitral regurgitation    mild by echo 2003  . Osteoarthritis   . Osteoarthrosis   . Osteoporosis   . Tenosynovitis    myxomatous    OBJECTIVE General Patient is awake, alert, and oriented x 3 and in no acute distress. Derm Skin is dry and supple bilateral. Negative open lesions or macerations. Remaining integument unremarkable. Nails are tender, long, thickened and dystrophic with subungual debris, consistent with onychomycosis, 1-5 bilateral. No signs of infection noted. Vasc  DP and PT pedal pulses palpable bilaterally. Temperature gradient within normal limits.  Neuro Epicritic and protective threshold sensation grossly intact bilaterally.  Musculoskeletal Exam No symptomatic pedal deformities noted bilateral. Muscular strength within normal limits.  ASSESSMENT 1. Onychodystrophic nails 1-5 bilateral with hyperkeratosis of nails.  2. Onychomycosis of nail due to dermatophyte bilateral 3. Pain in foot bilateral  PLAN OF CARE 1. Patient evaluated today.  2. Instructed to maintain good pedal hygiene and foot care.  3. Mechanical debridement of nails 1-5 bilaterally performed using a nail nipper. Filed with dremel without incident.  4. Return to clinic in 3 mos.    Edrick Kins, DPM [facility]  Dr. Edrick Kins, [address]                                        [place], [place] [address]                Office [phone]  Fax [phone]

## 2018-03-19 ENCOUNTER — Inpatient Hospital Stay (HOSPITAL_COMMUNITY)
Admission: EM | Admit: 2018-03-19 | Discharge: 2018-03-22 | DRG: 918 | Disposition: A | Discharge status: Skilled Nursing Facility | Payer: Medicare Other | Attending: Internal Medicine | Admitting: Internal Medicine

## 2018-03-19 ENCOUNTER — Encounter (HOSPITAL_COMMUNITY): Payer: Self-pay | Admitting: *Deleted

## 2018-03-19 ENCOUNTER — Other Ambulatory Visit: Payer: Self-pay

## 2018-03-19 ENCOUNTER — Inpatient Hospital Stay (HOSPITAL_COMMUNITY): Payer: Medicare Other

## 2018-03-19 DIAGNOSIS — Z8249 Family history of ischemic heart disease and other diseases of the circulatory system: Secondary | ICD-10-CM

## 2018-03-19 DIAGNOSIS — M455 Ankylosing spondylitis of thoracolumbar region: Secondary | ICD-10-CM | POA: Diagnosis not present

## 2018-03-19 DIAGNOSIS — I340 Nonrheumatic mitral (valve) insufficiency: Secondary | ICD-10-CM | POA: Diagnosis present

## 2018-03-19 DIAGNOSIS — Z79899 Other long term (current) drug therapy: Secondary | ICD-10-CM | POA: Diagnosis not present

## 2018-03-19 DIAGNOSIS — Z886 Allergy status to analgesic agent status: Secondary | ICD-10-CM

## 2018-03-19 DIAGNOSIS — E785 Hyperlipidemia, unspecified: Secondary | ICD-10-CM | POA: Diagnosis present

## 2018-03-19 DIAGNOSIS — T391X4A Poisoning by 4-Aminophenol derivatives, undetermined, initial encounter: Secondary | ICD-10-CM | POA: Diagnosis not present

## 2018-03-19 DIAGNOSIS — K5900 Constipation, unspecified: Secondary | ICD-10-CM | POA: Insufficient documentation

## 2018-03-19 DIAGNOSIS — M5440 Lumbago with sciatica, unspecified side: Secondary | ICD-10-CM

## 2018-03-19 DIAGNOSIS — I10 Essential (primary) hypertension: Secondary | ICD-10-CM | POA: Diagnosis present

## 2018-03-19 DIAGNOSIS — R5383 Other fatigue: Secondary | ICD-10-CM | POA: Diagnosis not present

## 2018-03-19 DIAGNOSIS — H9190 Unspecified hearing loss, unspecified ear: Secondary | ICD-10-CM | POA: Diagnosis present

## 2018-03-19 DIAGNOSIS — E872 Acidosis: Secondary | ICD-10-CM | POA: Diagnosis present

## 2018-03-19 DIAGNOSIS — K439 Ventral hernia without obstruction or gangrene: Secondary | ICD-10-CM | POA: Diagnosis present

## 2018-03-19 DIAGNOSIS — Z881 Allergy status to other antibiotic agents status: Secondary | ICD-10-CM

## 2018-03-19 DIAGNOSIS — H3530 Unspecified macular degeneration: Secondary | ICD-10-CM | POA: Diagnosis present

## 2018-03-19 DIAGNOSIS — Z888 Allergy status to other drugs, medicaments and biological substances status: Secondary | ICD-10-CM | POA: Diagnosis not present

## 2018-03-19 DIAGNOSIS — T391X2A Poisoning by 4-Aminophenol derivatives, intentional self-harm, initial encounter: Secondary | ICD-10-CM | POA: Diagnosis not present

## 2018-03-19 DIAGNOSIS — K219 Gastro-esophageal reflux disease without esophagitis: Secondary | ICD-10-CM | POA: Diagnosis present

## 2018-03-19 DIAGNOSIS — R0902 Hypoxemia: Secondary | ICD-10-CM | POA: Diagnosis not present

## 2018-03-19 DIAGNOSIS — K5909 Other constipation: Secondary | ICD-10-CM | POA: Diagnosis present

## 2018-03-19 DIAGNOSIS — Z87891 Personal history of nicotine dependence: Secondary | ICD-10-CM | POA: Diagnosis not present

## 2018-03-19 DIAGNOSIS — K5903 Drug induced constipation: Secondary | ICD-10-CM | POA: Diagnosis not present

## 2018-03-19 DIAGNOSIS — F1099 Alcohol use, unspecified with unspecified alcohol-induced disorder: Secondary | ICD-10-CM | POA: Diagnosis not present

## 2018-03-19 DIAGNOSIS — R109 Unspecified abdominal pain: Secondary | ICD-10-CM | POA: Diagnosis not present

## 2018-03-19 DIAGNOSIS — J309 Allergic rhinitis, unspecified: Secondary | ICD-10-CM | POA: Diagnosis present

## 2018-03-19 DIAGNOSIS — Z96611 Presence of right artificial shoulder joint: Secondary | ICD-10-CM | POA: Diagnosis present

## 2018-03-19 DIAGNOSIS — Z885 Allergy status to narcotic agent status: Secondary | ICD-10-CM

## 2018-03-19 DIAGNOSIS — R1084 Generalized abdominal pain: Secondary | ICD-10-CM | POA: Diagnosis not present

## 2018-03-19 DIAGNOSIS — T17908A Unspecified foreign body in respiratory tract, part unspecified causing other injury, initial encounter: Secondary | ICD-10-CM

## 2018-03-19 DIAGNOSIS — G8929 Other chronic pain: Secondary | ICD-10-CM | POA: Diagnosis not present

## 2018-03-19 DIAGNOSIS — Z882 Allergy status to sulfonamides status: Secondary | ICD-10-CM

## 2018-03-19 DIAGNOSIS — E871 Hypo-osmolality and hyponatremia: Secondary | ICD-10-CM

## 2018-03-19 DIAGNOSIS — G4733 Obstructive sleep apnea (adult) (pediatric): Secondary | ICD-10-CM | POA: Diagnosis present

## 2018-03-19 DIAGNOSIS — T391X4D Poisoning by 4-Aminophenol derivatives, undetermined, subsequent encounter: Secondary | ICD-10-CM | POA: Diagnosis not present

## 2018-03-19 DIAGNOSIS — J9811 Atelectasis: Secondary | ICD-10-CM | POA: Diagnosis not present

## 2018-03-19 DIAGNOSIS — R5381 Other malaise: Secondary | ICD-10-CM | POA: Diagnosis not present

## 2018-03-19 DIAGNOSIS — Z96653 Presence of artificial knee joint, bilateral: Secondary | ICD-10-CM | POA: Diagnosis present

## 2018-03-19 DIAGNOSIS — M545 Low back pain: Secondary | ICD-10-CM | POA: Diagnosis present

## 2018-03-19 DIAGNOSIS — R41841 Cognitive communication deficit: Secondary | ICD-10-CM | POA: Diagnosis not present

## 2018-03-19 DIAGNOSIS — M549 Dorsalgia, unspecified: Secondary | ICD-10-CM | POA: Diagnosis not present

## 2018-03-19 DIAGNOSIS — M810 Age-related osteoporosis without current pathological fracture: Secondary | ICD-10-CM | POA: Diagnosis present

## 2018-03-19 DIAGNOSIS — Z66 Do not resuscitate: Secondary | ICD-10-CM | POA: Diagnosis present

## 2018-03-19 DIAGNOSIS — R278 Other lack of coordination: Secondary | ICD-10-CM | POA: Diagnosis not present

## 2018-03-19 DIAGNOSIS — M6281 Muscle weakness (generalized): Secondary | ICD-10-CM | POA: Diagnosis not present

## 2018-03-19 LAB — CBC WITH DIFFERENTIAL/PLATELET
BASOS PCT: 0 %
Basophils Absolute: 0 10*3/uL (ref 0.0–0.1)
Eosinophils Absolute: 0.1 10*3/uL (ref 0.0–0.7)
Eosinophils Relative: 1 %
HEMATOCRIT: 31.5 % — AB (ref 36.0–46.0)
Hemoglobin: 10.2 g/dL — ABNORMAL LOW (ref 12.0–15.0)
LYMPHS ABS: 1.8 10*3/uL (ref 0.7–4.0)
Lymphocytes Relative: 20 %
MCH: 30.1 pg (ref 26.0–34.0)
MCHC: 32.4 g/dL (ref 30.0–36.0)
MCV: 92.9 fL (ref 78.0–100.0)
MONO ABS: 0.6 10*3/uL (ref 0.1–1.0)
MONOS PCT: 7 %
Neutro Abs: 6.8 10*3/uL (ref 1.7–7.7)
Neutrophils Relative %: 72 %
Platelets: 329 10*3/uL (ref 150–400)
RBC: 3.39 MIL/uL — ABNORMAL LOW (ref 3.87–5.11)
RDW: 13.1 % (ref 11.5–15.5)
WBC: 9.4 10*3/uL (ref 4.0–10.5)

## 2018-03-19 LAB — COMPREHENSIVE METABOLIC PANEL
ALBUMIN: 3.9 g/dL (ref 3.5–5.0)
ALK PHOS: 84 U/L (ref 38–126)
ALT: 13 U/L — ABNORMAL LOW (ref 14–54)
ALT: 14 U/L (ref 14–54)
AST: 21 U/L (ref 15–41)
AST: 18 U/L (ref 15–41)
Albumin: 3.2 g/dL — ABNORMAL LOW (ref 3.5–5.0)
Alkaline Phosphatase: 78 U/L (ref 38–126)
Anion gap: 13 (ref 5–15)
Anion gap: 11 (ref 5–15)
BILIRUBIN TOTAL: 0.5 mg/dL (ref 0.3–1.2)
BUN: 28 mg/dL — AB (ref 6–20)
BUN: 20 mg/dL (ref 6–20)
CALCIUM: 9 mg/dL (ref 8.9–10.3)
CALCIUM: 8.4 mg/dL — AB (ref 8.9–10.3)
CHLORIDE: 101 mmol/L (ref 101–111)
CO2: 19 mmol/L — ABNORMAL LOW (ref 22–32)
CO2: 20 mmol/L — ABNORMAL LOW (ref 22–32)
CREATININE: 0.77 mg/dL (ref 0.44–1.00)
Chloride: 97 mmol/L — ABNORMAL LOW (ref 101–111)
Creatinine, Ser: 1.35 mg/dL — ABNORMAL HIGH (ref 0.44–1.00)
GFR calc Af Amer: 39 mL/min — ABNORMAL LOW (ref >60)
GFR calc non Af Amer: 34 mL/min — ABNORMAL LOW (ref >60)
GLUCOSE: 154 mg/dL — AB (ref 65–99)
Glucose, Bld: 125 mg/dL — ABNORMAL HIGH (ref 65–99)
Potassium: 4 mmol/L (ref 3.5–5.1)
Potassium: 4.4 mmol/L (ref 3.5–5.1)
Sodium: 129 mmol/L — ABNORMAL LOW (ref 135–145)
Sodium: 132 mmol/L — ABNORMAL LOW (ref 135–145)
TOTAL PROTEIN: 7.1 g/dL (ref 6.5–8.1)
TOTAL PROTEIN: 6.7 g/dL (ref 6.5–8.1)
Total Bilirubin: 0.5 mg/dL (ref 0.3–1.2)

## 2018-03-19 LAB — CBC
HCT: 30.2 % — ABNORMAL LOW (ref 36.0–46.0)
HEMOGLOBIN: 9.9 g/dL — AB (ref 12.0–15.0)
MCH: 30.4 pg (ref 26.0–34.0)
MCHC: 32.8 g/dL (ref 30.0–36.0)
MCV: 92.6 fL (ref 78.0–100.0)
Platelets: 314 10*3/uL (ref 150–400)
RBC: 3.26 MIL/uL — ABNORMAL LOW (ref 3.87–5.11)
RDW: 13.1 % (ref 11.5–15.5)
WBC: 7.2 10*3/uL (ref 4.0–10.5)

## 2018-03-19 LAB — CREATININE, SERUM
CREATININE: 1.22 mg/dL — AB (ref 0.44–1.00)
GFR calc Af Amer: 44 mL/min — ABNORMAL LOW (ref >60)
GFR, EST NON AFRICAN AMERICAN: 38 mL/min — AB (ref >60)

## 2018-03-19 LAB — ACETAMINOPHEN LEVEL
ACETAMINOPHEN (TYLENOL), SERUM: 80 ug/mL — AB (ref 10–30)
Acetaminophen (Tylenol), Serum: 103 ug/mL — ABNORMAL HIGH (ref 10–30)
Acetaminophen (Tylenol), Serum: 14 ug/mL (ref 10–30)

## 2018-03-19 LAB — LIPASE, BLOOD: Lipase: 26 U/L (ref 11–51)

## 2018-03-19 LAB — PROTIME-INR
INR: 1
INR: 1.17
Prothrombin Time: 13.1 seconds (ref 11.4–15.2)
Prothrombin Time: 14.8 seconds (ref 11.4–15.2)

## 2018-03-19 LAB — MRSA PCR SCREENING: MRSA BY PCR: NEGATIVE

## 2018-03-19 LAB — SALICYLATE LEVEL

## 2018-03-19 MED ORDER — DEXTROSE 5 % IV SOLN: 15.0000 mg/kg/h | INTRAVENOUS | Status: DC

## 2018-03-19 MED ORDER — ONDANSETRON HCL 4 MG/2ML IJ SOLN
4.0000 mg | INTRAMUSCULAR | Status: DC | PRN
  Administered 2018-03-19 (×2): 4 mg via INTRAVENOUS
  Filled 2018-03-19 (×3): qty 2

## 2018-03-19 MED ORDER — ENOXAPARIN SODIUM 30 MG/0.3ML ~~LOC~~ SOLN
30.0000 mg | SUBCUTANEOUS | Status: DC
  Administered 2018-03-19 – 2018-03-21 (×3): 30 mg via SUBCUTANEOUS
  Filled 2018-03-19 (×3): qty 0.3

## 2018-03-19 MED ORDER — ACETYLCYSTEINE LOAD VIA INFUSION: 150.0000 mg/kg | Freq: Once | INTRAVENOUS | Status: DC

## 2018-03-19 MED ORDER — SODIUM CHLORIDE 0.9 % IV BOLUS: 2000.0000 mL | Freq: Once | INTRAVENOUS | Status: DC

## 2018-03-19 MED ORDER — GABAPENTIN 100 MG PO CAPS
100.0000 mg | ORAL_CAPSULE | Freq: Every day | ORAL | Status: DC
  Administered 2018-03-19 – 2018-03-21 (×3): 100 mg via ORAL
  Filled 2018-03-19 (×3): qty 1

## 2018-03-19 MED ORDER — ACETYLCYSTEINE LOAD VIA INFUSION
150.0000 mg/kg | Freq: Once | INTRAVENOUS | Status: AC
  Administered 2018-03-19: 10200 mg via INTRAVENOUS
  Filled 2018-03-19: qty 255

## 2018-03-19 MED ORDER — TRIMETHOPRIM 100 MG PO TABS
100.0000 mg | ORAL_TABLET | Freq: Every day | ORAL | Status: DC
  Administered 2018-03-20 – 2018-03-22 (×3): 100 mg via ORAL
  Filled 2018-03-19 (×4): qty 1

## 2018-03-19 MED ORDER — DEXTROSE 5 % IV SOLN
15.0000 mg/kg/h | INTRAVENOUS | Status: DC
  Administered 2018-03-19: 15 mg/kg/h via INTRAVENOUS
  Filled 2018-03-19: qty 200

## 2018-03-19 MED ORDER — PANTOPRAZOLE SODIUM 40 MG PO TBEC
40.0000 mg | DELAYED_RELEASE_TABLET | Freq: Every day | ORAL | Status: DC
  Administered 2018-03-20 – 2018-03-22 (×3): 40 mg via ORAL
  Filled 2018-03-19 (×3): qty 1

## 2018-03-19 MED ORDER — OXYCODONE HCL 5 MG PO TABS
5.0000 mg | ORAL_TABLET | Freq: Four times a day (QID) | ORAL | Status: DC | PRN
  Administered 2018-03-22: 5 mg via ORAL
  Filled 2018-03-19: qty 1

## 2018-03-19 MED ORDER — SODIUM CHLORIDE 0.9 % IV SOLN
INTRAVENOUS | Status: DC
  Administered 2018-03-19 – 2018-03-21 (×4): via INTRAVENOUS

## 2018-03-19 NOTE — H&P (Signed)
History and Physical:    [name]   BSJ:[id number] DOB: 1926-06-03 DOA: 03/19/2018  Referring MD/provider: Dr Melina Copa PCP: Deland Pretty, MD   Patient coming from: Home  Chief Complaint: Percocet overdose  History of Present Illness:   [name] is an 84 y.o. female with past medical history significant for chronic pain who is brought in by her family for altered mental status. History is per patient, ED note and patient's family.  Patient has chronic lower back pain for many years for which she is on Percocet one tab by mouth 4 times a day. She lives by herself in her son's house and is able to take care of herself with the assistance of multiple assistive devices including a walker at baseline including getting out of bed, bathing and dressing and fixing her meals. Patient was in her usual state of relatively poor health until this morning when her son found her to be unusually asleep and was unable to arouse her. Patient states that she had some much pain last night that she took all of her Percocet tablets. This is estimated to be around 60 tablets. When I asked patient specifically whether she knew that this was a bad idea and could be dangerous she said yes she did know that. She had previously told her family that she "wanted to go to heaven and be with my son". Her son committed suicide in 2017. When I asked patient whether she knew that she wouldn't wake up she said yes and when I asked her if she wanted to wake up she said no she didn't want to wake up.  Patient's daughter and son are her healthcare power of attorney. They're both clear that patient is DO NOT RESUSCITATE and wants no heroic measures at all.However they do want her to get treatment for the Tylenol overdose. I discussed this with the patient and she said that was okay. She is willing to get IV fluids and antibiotics and any other IV medications as needed.   ED Course:  The patient was noted to have a  Tylenol level of 103 approximately 7 hours after probable ingestion. Patient and family thinks that she was fine until 2 and patient think she probably took all of her Percocets at 2 AM. Dr. Melina Copa called poison control who noted that she has "moderate levels" and does meet criteria for an acetylcysteine. Dr. Melina Copa ordered loading dose of an acetylcysteine at 150 mg/kg to be given over 60 minutes.  ROS:   ROS   Review of Systems: Unable to do due to somnolence of patient  Past Medical History:   Past Medical History:  Diagnosis Date  . Actinic keratosis   . Allergic rhinitis   . Back pain   . Chronic constipation   . GERD (gastroesophageal reflux disease)   . Hyperlipidemia   . Hypertension   . Hyponatremia   . Leg edema   . Macular degeneration   . Mitral regurgitation    mild by echo 2003  . Osteoarthritis   . Osteoarthrosis   . Osteoporosis   . Tenosynovitis    myxomatous    Past Surgical History:   Past Surgical History:  Procedure Laterality Date  . CARPAL TUNNEL RELEASE     bilateral  . hernial repain    . INGUINAL HERNIA REPAIR     laparoscopic preperitoneal repair of bilateral inguinal hernias  . REPLACEMENT TOTAL KNEE BILATERAL    . REVERSE SHOULDER ARTHROPLASTY  Right 05/08/2014   Procedure: REVERSE SHOULDER ARTHROPLASTY RIGHT ;  Surgeon: Marin Shutter, MD;  Location: [place];  Service: Orthopedics;  Laterality: Right;  . SYNOVECTOMY     of right long finger  . tonisellectomy    . tonisillectomy    . TONSILLECTOMY      Social History:   Social History   Socioeconomic History  . Marital status: Divorced    Spouse name: Not on file  . Number of children: 4  . Years of education: Not on file  . Highest education level: Not on file  Occupational History  . Not on file  Social Needs  . Financial resource strain: Not on file  . Food insecurity:    Worry: Not on file    Inability: Not on file  . Transportation needs:    Medical: Not on file     Non-medical: Not on file  Tobacco Use  . Smoking status: Former Smoker    Packs/day: 1.00    Years: 2.00    Pack years: 2.00    Types: Cigarettes    Last attempt to quit: 12/07/1967    Years since quitting: 50.3  . Smokeless tobacco: Never Used  Substance and Sexual Activity  . Alcohol use: Yes    Alcohol/week: 1.2 oz    Types: 2 Glasses of wine per week    Comment: 2 glasses per week  . Drug use: No  . Sexual activity: Never  Lifestyle  . Physical activity:    Days per week: Not on file    Minutes per session: Not on file  . Stress: Not on file  Relationships  . Social connections:    Talks on phone: Not on file    Gets together: Not on file    Attends religious service: Not on file    Active member of club or organization: Not on file    Attends meetings of clubs or organizations: Not on file    Relationship status: Not on file  . Intimate partner violence:    Fear of current or ex partner: Not on file    Emotionally abused: Not on file    Physically abused: Not on file    Forced sexual activity: Not on file  Other Topics Concern  . Not on file  Social History Narrative  . Not on file    Allergies   Aspirin; Boniva [ibandronic acid]; Morphine; Nitrofuran derivatives; Penicillins; and Sulfonamide derivatives  Family history:   Family History  Problem Relation Age of Onset  . Heart attack Father   . Heart attack Brother     Current Medications:   Prior to Admission medications   Medication Sig Start Date End Date Taking? Authorizing Provider  calcium citrate-vitamin D (CITRACAL+D) 315-200 MG-UNIT per tablet Take 1 tablet by mouth daily.     [provider]  cetirizine (ZYRTEC) 10 MG tablet Take 15 mg by mouth daily.     [provider]  conjugated estrogens (PREMARIN) vaginal cream Place 1 Applicatorful vaginally 3 (three) times a week.    [provider]  docusate sodium (COLACE) 100 MG capsule Take 100 mg by mouth 2 (two) times  daily.    [provider]  gabapentin (NEURONTIN) 100 MG capsule Take 100 mg by mouth at bedtime.    [provider]  HYDROcodone-acetaminophen (NORCO/VICODIN) 5-325 MG tablet Take 1 tablet by mouth 3 (three) times daily.    [provider]  irbesartan (AVAPRO) 75 MG tablet  Take 1 tablet (75 mg total) by mouth daily. 05/10/14   Orson Eva, MD  Multiple Vitamin (MULTIVITAMIN) capsule Take 1 capsule by mouth daily.     [provider]  Multiple Vitamins-Minerals (PRESERVISION AREDS 2 PO) Take 1 tablet by mouth 2 (two) times daily.     [provider]  Probiotic Product ([place]) Take 1 tablet by mouth daily.    [provider]  Ranibizumab (LUCENTIS IO) Inject into the eye every 8 (eight) weeks. Injection in right eye    [provider]  ranitidine (ZANTAC) 150 MG capsule  09/10/16   [provider]  spironolactone (ALDACTONE) 25 MG tablet Take 25 mg by mouth once.     [provider]  trimethoprim (TRIMPEX) 100 MG tablet Take 100 mg by mouth daily. UTI prophylaxis.    [provider]  zoledronic acid (RECLAST) 5 MG/100ML SOLN injection Inject 5 mg into the vein See admin instructions. Once annually.    [provider]    Physical Exam:   Vitals:   03/19/18 1000 03/19/18 1015 03/19/18 1030 03/19/18 1105  BP: (!) 85/49 (!) 93/59 (!) 103/53   Pulse: 63 65 63   Resp: 11 11 15    SpO2: 93% 95% 94%   Weight:    68 kg (150 lb)  Height:    5\' 6"  (1.676 m)     Physical Exam: Blood pressure (!) 103/53, pulse 63, resp. rate 15, height 5\' 6"  (1.676 m), weight 68 kg (150 lb), SpO2 94 %. Gen: frail elderly patient looking much older than stated age, gaunt with minimal hair. HEENT: Sclerae anicteric. Poor dentition. Dry mucous membranes. Some dried blood at her lips. Chest: Moderately good air entry bilaterally with no adventitious sounds.  CV: Distant, irregular, no audible  murmurs. Abdomen: patient does have bowel sounds. Her abdomen is minimally distended.She does have mild diffuse tenderness to palpation in all quadrants. There is slight guarding. No rebound tenderness Extremities: 2+ edema to knees bilaterally Skin: Warm and dry. Neuro: patient is somnolent but easily arousable by voice alone. She answers questions appropriately when she is awake. She falls asleep quickly thereafter. Psych: Patient is cooperative and seems to be logical and coherent.  Data Review:    Labs: Basic Metabolic Panel: Recent Labs  Lab 03/19/18 0905  NA 129*  K 4.0  CL 97*  CO2 19*  GLUCOSE 154*  BUN 28*  CREATININE 1.35*  CALCIUM 9.0   Liver Function Tests: Recent Labs  Lab 03/19/18 0905  AST 21  ALT 13*  ALKPHOS 84  BILITOT 0.5  PROT 7.1  ALBUMIN 3.9   Recent Labs  Lab 03/19/18 0905  LIPASE 26   No results for input(s): AMMONIA in the last 168 hours. CBC: Recent Labs  Lab 03/19/18 0905  WBC 9.4  NEUTROABS 6.8  HGB 10.2*  HCT 31.5*  MCV 92.9  PLT 329   Cardiac Enzymes: No results for input(s): CKTOTAL, CKMB, CKMBINDEX, TROPONINI in the last 168 hours.  BNP (last 3 results) No results for input(s): PROBNP in the last 8760 hours. CBG: No results for input(s): GLUCAP in the last 168 hours.  Urinalysis    Component Value Date/Time   COLORURINE YELLOW 05/05/2014 2014   APPEARANCEUR CLEAR 05/05/2014 2014   LABSPEC 1.017 05/05/2014 2014   PHURINE 6.0 05/05/2014 2014   GLUCOSEU NEGATIVE 05/05/2014 2014   HGBUR NEGATIVE 05/05/2014 2014   [place] NEGATIVE 05/05/2014 2014   [place] NEGATIVE 05/05/2014 2014  PROTEINUR NEGATIVE 05/05/2014 2014   UROBILINOGEN 0.2 05/05/2014 2014   NITRITE NEGATIVE 05/05/2014 2014   LEUKOCYTESUR TRACE (A) 05/05/2014 2014      Radiographic Studies: No results found.  EKG: Independently reviewed. A regular rhythm with wandering atrial pacemaker at 100. Rare PVC.poor voltage limb leads. No acute ST-T  wave changes.   Assessment/Plan:   Principal Problem:   Acetaminophen overdose of undetermined intent Active Problems:   Essential hypertension, benign   OSA (obstructive sleep apnea)   Chronic lower back pain   APAP OVERDOSE Patient with a APAP level of 103 approximately 7 hours after ingestion, falls in to treat category on nomogram.  At present patient has no evidence of liver damage with normal LFTs and normal PT/INR. Discussed with poison control and I have reviewed the recommendations on up-to-date.  N-acetylcysteine will be started 150 mg/kg loading dose to be given over 60 minutes and then an before meals drip per protocol. Pharmacy consultation has been requested. Tylenol level to be drawn 4 hours after initial level and then 21 hours after the initial dose of an before meals, these have been ordered. Will also follow liver function tests and PT/INR.  ABDOMINAL PAIN Patient with chronic constipation, I suspect patient has constipation versus fecal impaction which certainly could have worsened her back pain. Will get a KUB to assess stool burden.   HYPONATREMIA  Will provide gentle hydration and recheck in the morning  SUICIDE ATTEMPT  Patient clearly states that this was a suicide attempt and that she is not interested in living longer. She does however agreed to treatment for Tylenol overdose. When patient is more awake she can be assessed by psychiatry for further treatment as warranted.  NON ANION GAP METABOLIC ACIDOSIS  Likely secondary to Tylenol overdose. Will follow as we treat with hydration and NAC.  CHRONIC PAIN  I have written patient for oxycodone without the Tylenol when necessary to start at midnight tonight as I hope she will be more awake by then. Will need to continue when necessary narcotics to avoid withdrawal and unnecessary pain  HTN Hold irbesartan and spironolactone until blood pressure has rebounded    Other information:   DVT prophylaxis:  Lovenox ordered. Code Status: DNR Family Communication: patient's 2 daughters and son were at bedside and participated in the discussion Disposition Plan: to be determined Consults called: poison control Admission status: inpatient   The medical decision making on this patient was of high complexity and the patient is at high risk for clinical deterioration, therefore this is a level 3 visit.   Dewaine Oats Tublu Zaydrian Batta [facility]  If 7PM-7AM, please contact night-coverage [email or web address] Password [facility] 03/19/2018, 11:35 AM

## 2018-03-19 NOTE — Progress Notes (Signed)
15:00 [place] ([phone] [phone]) called for update on patient's status.

## 2018-03-19 NOTE — Progress Notes (Signed)
MEDICATION RELATED CONSULT NOTE -  Pharmacy Consult for N-Acetylcysteine Indication: Acetaminophen overdose  Allergies  Allergen Reactions  . Aspirin     Unknown  . Boniva [Ibandronic Acid]     Unknown  . Morphine     Unknown  . Nitrofuran Derivatives     Unknown  . Penicillins     Unknown reaction . Received Ancef without problem   . Sulfonamide Derivatives     Unknown   Labs: Recent Labs    03/19/18 0905 03/19/18 1141 03/19/18 2124  WBC 9.4 7.2  --   HGB 10.2* 9.9*  --   HCT 31.5* 30.2*  --   PLT 329 314  --   CREATININE 1.35* 1.22* 0.77  ALBUMIN 3.9  --  3.2*  PROT 7.1  --  6.7  AST 21  --  18  ALT 13*  --  14  ALKPHOS 84  --  78  BILITOT 0.5  --  0.5   Estimated Creatinine Clearance: 44.6 mL/min (by C-G formula based on SCr of 0.77 mg/dL).   Assessment: 84 YO female presenting to ED after overdose of Percocet tablets PTA around 0200, APAP lvl of 103 roughly 7h post-ingestion, normal LFTs and INR.  [facility] at 1120 03/19/2018 and received recommendations to treat with the following plan.  PM:  Labs are within normal limits, poison control contacted and recommended stopping acetylcysteine.  Case has been closed with poison control.  Plan:  Stop acetylcysteine Follow up labs in AM Monitor clinical progression  Thank you Anette Guarneri, PharmD [phone] 03/19/2018 10:21 PM

## 2018-03-19 NOTE — ED Notes (Signed)
Family in room at this time. 

## 2018-03-19 NOTE — ED Triage Notes (Signed)
Patient stated she took a "handfull" of Oxycodone APAP 5-325 pills because she is "tired of the pain".

## 2018-03-19 NOTE — ED Provider Notes (Signed)
[facility] EMERGENCY DEPARTMENT Provider Note   CSN: [id number] Arrival date & time: 03/19/18  5361     History   Chief Complaint Chief Complaint  Patient presents with  . Ingestion  . Suicide Attempt    HPI [name] is a 84 y.o. female.  She is brought in by ambulance and her son is giving most of the history.  At baseline she is hard of hearing but she lives independently in the room underneath her son.  She has chronic back pain for which she takes narcotic pain medicine.  She usually uses a walker at baseline.  Today she is found by her son to be moaning and less responsive.  By the time EMS got there they identified that there was a bottle of oxycodone that was empty.  Patient admits at 2 AM that she took a handful of pills because the pain was so bad.  She identifies the pain in her back and in her abdomen.  She does have a history of a ventral hernia that was to be repaired secondary to her comorbid illness. The patient herself is arousable and agrees that she took the medicine at 2 AM.  She denies any other coingestions that she took on with some hot tea.  The son states it was a little bit of vomit in the room.  Currently the patient is complaining of back pain and abdominal pain.  She is unable to rate rate the pain severity.  The history is provided by the patient and a relative.  Ingestion  This is a new problem. The current episode started 6 to 12 hours ago. The problem has not changed since onset.Associated symptoms include abdominal pain. Pertinent negatives include no chest pain, no headaches and no shortness of breath. Nothing aggravates the symptoms. Nothing relieves the symptoms. She has tried nothing for the symptoms. The treatment provided no relief.    Past Medical History:  Diagnosis Date  . Actinic keratosis   . Allergic rhinitis   . Back pain   . Chronic constipation   . GERD (gastroesophageal reflux disease)   . Hyperlipidemia   .  Hypertension   . Hyponatremia   . Leg edema   . Macular degeneration   . Mitral regurgitation    mild by echo 2003  . Osteoarthritis   . Osteoarthrosis   . Osteoporosis   . Tenosynovitis    myxomatous    Patient Active Problem List   Diagnosis Date Noted  . OSA (obstructive sleep apnea) 10/06/2016  . Chest pain 08/05/2015  . UTI (urinary tract infection) 05/06/2014  . Essential hypertension, benign 05/06/2014  . Humerus fracture 05/06/2014  . Dizziness 05/05/2014  . Fall 05/05/2014  . External hemorrhoid, thrombosed-left 01/24/2013  . Acute blood loss anemia 07/01/2012  . S/P left TH revision 06/28/2012  . Hyponatremia 06/11/2012  . Hip fx ([place]) 06/09/2012  . Leukocytosis 06/09/2012  . MITRAL VALVE DISORDERS 02/10/2010  . MURMUR 01/16/2010  . GERD 01/15/2010  . Osteoarthritis 01/15/2010  . BACK PAIN, CHRONIC 01/15/2010  . OSTEOPOROSIS 01/15/2010  . CHEST DISCOMFORT 01/15/2010  . GASTROINTESTINAL HEMORRHAGE, HX OF 01/15/2010    Past Surgical History:  Procedure Laterality Date  . CARPAL TUNNEL RELEASE     bilateral  . hernial repain    . INGUINAL HERNIA REPAIR     laparoscopic preperitoneal repair of bilateral inguinal hernias  . REPLACEMENT TOTAL KNEE BILATERAL    . REVERSE SHOULDER ARTHROPLASTY Right 05/08/2014  Procedure: REVERSE SHOULDER ARTHROPLASTY RIGHT ;  Surgeon: Marin Shutter, MD;  Location: [facility];  Service: Orthopedics;  Laterality: Right;  . SYNOVECTOMY     of right long finger  . tonisellectomy    . tonisillectomy    . TONSILLECTOMY       OB History   None      Home Medications    Prior to Admission medications   Medication Sig Start Date End Date Taking? Authorizing Provider  calcium citrate-vitamin D (CITRACAL+D) 315-200 MG-UNIT per tablet Take 1 tablet by mouth daily.     [provider]  cetirizine (ZYRTEC) 10 MG tablet Take 15 mg by mouth daily.     [provider]  conjugated estrogens (PREMARIN) vaginal cream  Place 1 Applicatorful vaginally 3 (three) times a week.    [provider]  docusate sodium (COLACE) 100 MG capsule Take 100 mg by mouth 2 (two) times daily.    [provider]  gabapentin (NEURONTIN) 100 MG capsule Take 100 mg by mouth at bedtime.    [provider]  HYDROcodone-acetaminophen (NORCO/VICODIN) 5-325 MG tablet Take 1 tablet by mouth 3 (three) times daily.    [provider]  irbesartan (AVAPRO) 75 MG tablet Take 1 tablet (75 mg total) by mouth daily. 05/10/14   Orson Eva, MD  Multiple Vitamin (MULTIVITAMIN) capsule Take 1 capsule by mouth daily.     [provider]  Multiple Vitamins-Minerals (PRESERVISION AREDS 2 PO) Take 1 tablet by mouth 2 (two) times daily.     [provider]  Probiotic Product ([place]) Take 1 tablet by mouth daily.    [provider]  Ranibizumab (LUCENTIS IO) Inject into the eye every 8 (eight) weeks. Injection in right eye    [provider]  ranitidine (ZANTAC) 150 MG capsule  09/10/16   [provider]  spironolactone (ALDACTONE) 25 MG tablet Take 25 mg by mouth once.     [provider]  trimethoprim (TRIMPEX) 100 MG tablet Take 100 mg by mouth daily. UTI prophylaxis.    [provider]  zoledronic acid (RECLAST) 5 MG/100ML SOLN injection Inject 5 mg into the vein See admin instructions. Once annually.    [provider]    Family History Family History  Problem Relation Age of Onset  . Heart attack Father   . Heart attack Brother     Social History Social History   Tobacco Use  . Smoking status: Former Smoker    Packs/day: 1.00    Years: 2.00    Pack years: 2.00    Types: Cigarettes    Last attempt to quit: 12/07/1967    Years since quitting: 50.3  . Smokeless tobacco: Never Used  Substance Use Topics  . Alcohol use: Yes    Alcohol/week: 1.2 oz    Types: 2 Glasses of wine per week    Comment: 2 glasses per week    . Drug use: No     Allergies   Aspirin; Boniva [ibandronic acid]; Morphine; Nitrofuran derivatives; Penicillins; and Sulfonamide derivatives   Review of Systems Review of Systems  Constitutional: Negative for chills and fever.  HENT: Negative for ear pain and sore throat.   Eyes: Negative for pain and visual disturbance.  Respiratory: Negative for cough and shortness of breath.   Cardiovascular: Negative for chest pain and palpitations.  Gastrointestinal: Positive for abdominal pain. Negative for vomiting.  Genitourinary: Negative for dysuria and hematuria.  Musculoskeletal: Positive for  back pain. Negative for arthralgias.  Skin: Negative for color change and rash.  Neurological: Negative for seizures, syncope and headaches.  All other systems reviewed and are negative.    Physical Exam Updated Vital Signs BP (!) 164/64 (BP Location: Right Arm)   Pulse (!) 57   Resp (!) 8   SpO2 95%   Physical Exam  Constitutional: She appears well-developed and well-nourished. No distress.  HENT:  Head: Normocephalic and atraumatic.  Eyes: Pupils are equal, round, and reactive to light. Conjunctivae and EOM are normal.  Neck: Normal range of motion. Neck supple.  Cardiovascular: Normal rate, regular rhythm, normal heart sounds and normal pulses. Frequent extrasystoles are present.  No murmur heard. Pulmonary/Chest: Effort normal and breath sounds normal. No stridor. No respiratory distress. She has no wheezes.  Abdominal: Soft. She exhibits no distension. There is no tenderness. A hernia (umbilical - no ecchymosis, soft) is present.  Musculoskeletal: Normal range of motion. She exhibits no edema, tenderness or deformity.  Neurological: She is alert. She has normal strength. No cranial nerve deficit or sensory deficit. GCS eye subscore is 4. GCS verbal subscore is 5. GCS motor subscore is 6.  Patient is sleepy but easily arousable to voice.  She is able to answer in full sentences.  She  has no focal findings on neuro exam.  Skin: Skin is warm and dry. Capillary refill takes less than 2 seconds. She is not diaphoretic.  Psychiatric: She has a normal mood and affect.  Nursing note and vitals reviewed.    ED Treatments / Results  Labs (all labs ordered are listed, but only abnormal results are displayed) Labs Reviewed  COMPREHENSIVE METABOLIC PANEL - Abnormal; Notable for the following components:      Result Value   Sodium 129 (*)    Chloride 97 (*)    CO2 19 (*)    Glucose, Bld 154 (*)    BUN 28 (*)    Creatinine, Ser 1.35 (*)    ALT 13 (*)    GFR calc non Af Amer 34 (*)    GFR calc Af Amer 39 (*)    All other components within normal limits  CBC WITH DIFFERENTIAL/PLATELET - Abnormal; Notable for the following components:   RBC 3.39 (*)    Hemoglobin 10.2 (*)    HCT 31.5 (*)    All other components within normal limits  ACETAMINOPHEN LEVEL - Abnormal; Notable for the following components:   Acetaminophen (Tylenol), Serum 103 (*)    All other components within normal limits  CBC - Abnormal; Notable for the following components:   RBC 3.26 (*)    Hemoglobin 9.9 (*)    HCT 30.2 (*)    All other components within normal limits  CREATININE, SERUM - Abnormal; Notable for the following components:   Creatinine, Ser 1.22 (*)    GFR calc non Af Amer 38 (*)    GFR calc Af Amer 44 (*)    All other components within normal limits  ACETAMINOPHEN LEVEL - Abnormal; Notable for the following components:   Acetaminophen (Tylenol), Serum 80 (*)    All other components within normal limits  MRSA PCR SCREENING  LIPASE, BLOOD  SALICYLATE LEVEL  PROTIME-INR  ACETAMINOPHEN LEVEL  PROTIME-INR  COMPREHENSIVE METABOLIC PANEL  ACETAMINOPHEN LEVEL  COMPREHENSIVE METABOLIC PANEL  PROTIME-INR    EKG EKG Interpretation  Date/Time:  Saturday March 19 2018 08:37:27 EDT Ventricular Rate:  100 PR Interval:    QRS Duration: 96 QT Interval:  381 QTC Calculation: 451 R  Axis:   11 Text Interpretation:  Sinus tachycardia Multiple premature complexes, vent & supraven Low voltage, extremity and precordial leads similar to prior except pvc and arrhythmia Confirmed by Aletta Edouard [phone]) on 03/19/2018 8:55:18 AM   Radiology No results found.  Procedures .Critical Care Performed by: Hayden Rasmussen, MD Authorized by: Hayden Rasmussen, MD   Critical care provider statement:    Critical care time (minutes):  30   Critical care time was exclusive of:  Separately billable procedures and treating other patients   Critical care was necessary to treat or prevent imminent or life-threatening deterioration of the following conditions:  [place]   Critical care was time spent personally by me on the following activities:  Discussions with consultants, development of treatment plan with patient or surrogate, evaluation of patient's response to treatment, examination of patient, obtaining history from patient or surrogate, ordering and performing treatments and interventions, ordering and review of laboratory studies, ordering and review of radiographic studies, pulse oximetry, re-evaluation of patient's condition and review of old charts   I assumed direction of critical care for this patient from another provider in my specialty: no     (including critical care time)  Medications Ordered in ED Medications - No data to display   Initial Impression / Assessment and Plan / ED Course  I have reviewed the triage vital signs and the nursing notes.  Pertinent labs & imaging results that were available during my care of the patient were reviewed by me and considered in my medical decision making (see chart for details).  Clinical Course as of Mar 19 1801  Sat Mar 19, 2018  1022 Acetaminophen (Tylenol), S(!): 103 [MB]  1024 Discussed with poison control Haynes Dage who follow the patient's time of ingestion is 7 hours without level that she would be in the group that  would potentially benefit from [place].  They are recommending 150 make per cake load over an hour and then maintenance of 15 make per cake per hour for 23 hours.  Repeat acetaminophen and LFTs at 23 hours and if they are at all trending up to please recontact them regarding continuous NAC ti follow.    [MB]  0867 Discussed with hospitalist Dr. Jamse Arn who will accept the patient to her service.  I have ordered the initial load of N-acetylcysteine.  I informed the patient and family of the plan and answered their questions.   [MB]    Clinical Course User Index [MB] Hayden Rasmussen, MD    Final Clinical Impressions(s) / ED Diagnoses   Final diagnoses:  Acetaminophen overdose of undetermined intent, initial encounter    ED Discharge Orders    None       Hayden Rasmussen, MD 03/19/18 [phone]

## 2018-03-19 NOTE — ED Notes (Signed)
Patient stated that she was just "ready to go to heaven".

## 2018-03-19 NOTE — Clinical Social Work Note (Signed)
Clinical Social Work Assessment  Patient Details  Name: [name] MRN: [id number] Date of Birth: 01/10/1926  Date of referral:  03/19/18               Reason for consult:  Other (Comment Required)(DNR paperwork. )                Permission sought to share information with:  Family Supports Permission granted to share information::  Yes, Verbal Permission Granted  Name::     Rozann Lesches  Agency::  family  Relationship::  daughter  Contact Information:  Rosalinda Seaman [phone]  Housing/Transportation Living arrangements for the past 2 months:  Single Family Home(with son ) Source of Information:  Adult Children Patient Interpreter Needed:  None Criminal Activity/Legal Involvement Pertinent to Current Situation/Hospitalization:  No - Comment as needed Significant Relationships:  Adult Children Lives with:  Adult Children Do you feel safe going back to the place where you live?  Yes Need for family participation in patient care:  Yes (Comment)  Care giving concerns:  CSW went to speak with pt at bedside. CSW observed that pt was asleep in bed therefore CSW spoke with pt's daughter and son at bedside. CSW was informed that family is concerned with pt as they expressed that this incident was pt's way of wanting to harm self. Pt's daughter expressed that pt has been in a great amount of pain and can not figure out how to get pain under control.    Social Worker assessment / plan:  CSW spoke with pt's daughter and son at bedside. During this time CSW was informed that pt is form home with son where pt has lived at for over 20 years. Per daughter reports pt has been in pain a lot more lately due to chronic back issues. Pt has support form daughter and son at this time. CSW spoke with family about placement options as family expressed that they think pt may need long term placement at the end of this hospital stay. CSW provided pt with resources and spoke with family about steps to obtain  Medicaid at this time. Family expressed verbal understanding and expressed that they would start this process.   During this assessment pt was lying in bed and groaning with pain. Pt was wearing hospital gown and resting in the bed.   Employment status:  Retired Forensic scientist:  Medicare PT Recommendations:  Not assessed at this time Information / Referral to community resources:  Skilled Nursing Facility(spoke with family about SNF placement.)  Patient/Family's Response to care:  Pt's daughter and son expressed verbal understanding and had a positive response to plan of care for pt at this time.   Patient/Family's Understanding of and Emotional Response to Diagnosis, Current Treatment, and Prognosis: No further questions or concerns have been presented to CSW at this time. CSW unsure if pt is able to understand care however pt's children were understanding ans sought further information form CSW on how to get pt into a SNF. CSW explained that CSW would assists with that as needed. Family agreeable and understanding at this time.   Emotional Assessment Appearance:  Appears stated age Attitude/Demeanor/Rapport:  Unable to Assess Affect (typically observed):    Orientation:  Fluctuating Orientation (Suspected and/or reported Sundowners)(pt was asleep. ) Alcohol / Substance use:  Not Applicable Psych involvement (Current and /or in the community):  No (Comment)(not at this time. )  Discharge Needs  Concerns to be addressed:  Home Safety Concerns,  Medication Concerns, Care Coordination, Cognitive Concerns Readmission within the last 30 days:  No Current discharge risk:  Dependent with Mobility, Other(misuse of medication.) Barriers to Discharge:  Continued Medical Work up   [employer], [place] 03/19/2018, 1:46 PM

## 2018-03-19 NOTE — Progress Notes (Signed)
MEDICATION RELATED CONSULT NOTE - INITIAL   Pharmacy Consult for N-Acetylcysteine Indication: Acetaminophen overdose  Allergies  Allergen Reactions  . Aspirin     Unknown  . Boniva [Ibandronic Acid]     Unknown  . Morphine     Unknown  . Nitrofuran Derivatives     Unknown  . Penicillins     Unknown reaction . Received Ancef without problem   . Sulfonamide Derivatives     Unknown    Patient Measurements: Height: 5\' 6"  (167.6 cm) Weight: 150 lb (68 kg) IBW/kg (Calculated) : 59.3  Vital Signs: BP: 168/73 (04/20 1145) Pulse Rate: 72 (04/20 1145) Intake/Output from previous day: No intake/output data recorded. Intake/Output from this shift: No intake/output data recorded.  Labs: Recent Labs    03/19/18 0905  WBC 9.4  HGB 10.2*  HCT 31.5*  PLT 329  CREATININE 1.35*  ALBUMIN 3.9  PROT 7.1  AST 21  ALT 13*  ALKPHOS 84  BILITOT 0.5   Estimated Creatinine Clearance: 26.4 mL/min (A) (by C-G formula based on SCr of 1.35 mg/dL (H)).   Microbiology: No results found for this or any previous visit (from the past 720 hour(s)).  Medical History: Past Medical History:  Diagnosis Date  . Actinic keratosis   . Allergic rhinitis   . Back pain   . Chronic constipation   . GERD (gastroesophageal reflux disease)   . Hyperlipidemia   . Hypertension   . Hyponatremia   . Leg edema   . Macular degeneration   . Mitral regurgitation    mild by echo 2003  . Osteoarthritis   . Osteoarthrosis   . Osteoporosis   . Tenosynovitis    myxomatous    Medications:  Infusions:  . sodium chloride    . acetylcysteine      Assessment: 84 YO female presenting to ED after overdose of Percocet tablets PTA around 0200, APAP lvl of 103 roughly 7h post-ingestion, normal LFTs and INR.  [place] Control at 1120 03/19/2018 and received recommendations to treat with the following plan.  Plan:  N-acetylcysteine 150mg /kg loading dose over 1 hour, followed by 15mg /kg/hr  infusion for 23 hours Repeat AST/ALT and APAP level at 2100 - If wnl can d/c treatment given she is borderline per Poison Control Monitor clinical progression  Bertis Ruddy, PharmD Pharmacy Resident Pager #: [phone] 03/19/2018 12:27 PM

## 2018-03-20 ENCOUNTER — Encounter (HOSPITAL_COMMUNITY): Payer: Self-pay | Admitting: *Deleted

## 2018-03-20 DIAGNOSIS — H9190 Unspecified hearing loss, unspecified ear: Secondary | ICD-10-CM

## 2018-03-20 DIAGNOSIS — M549 Dorsalgia, unspecified: Secondary | ICD-10-CM

## 2018-03-20 DIAGNOSIS — T391X4A Poisoning by 4-Aminophenol derivatives, undetermined, initial encounter: Secondary | ICD-10-CM

## 2018-03-20 DIAGNOSIS — R5383 Other fatigue: Secondary | ICD-10-CM

## 2018-03-20 DIAGNOSIS — F1099 Alcohol use, unspecified with unspecified alcohol-induced disorder: Secondary | ICD-10-CM

## 2018-03-20 DIAGNOSIS — Z87891 Personal history of nicotine dependence: Secondary | ICD-10-CM

## 2018-03-20 DIAGNOSIS — R5381 Other malaise: Secondary | ICD-10-CM

## 2018-03-20 LAB — COMPREHENSIVE METABOLIC PANEL
ALT: 13 U/L — AB (ref 14–54)
ANION GAP: 12 (ref 5–15)
AST: 22 U/L (ref 15–41)
Albumin: 3.3 g/dL — ABNORMAL LOW (ref 3.5–5.0)
Alkaline Phosphatase: 80 U/L (ref 38–126)
BUN: 17 mg/dL (ref 6–20)
CHLORIDE: 102 mmol/L (ref 101–111)
CO2: 18 mmol/L — ABNORMAL LOW (ref 22–32)
CREATININE: 0.76 mg/dL (ref 0.44–1.00)
Calcium: 8.3 mg/dL — ABNORMAL LOW (ref 8.9–10.3)
Glucose, Bld: 95 mg/dL (ref 65–99)
POTASSIUM: 4.1 mmol/L (ref 3.5–5.1)
SODIUM: 132 mmol/L — AB (ref 135–145)
Total Bilirubin: 0.5 mg/dL (ref 0.3–1.2)
Total Protein: 6.6 g/dL (ref 6.5–8.1)

## 2018-03-20 LAB — ACETAMINOPHEN LEVEL

## 2018-03-20 LAB — PROTIME-INR
INR: 1.12
PROTHROMBIN TIME: 14.3 s (ref 11.4–15.2)

## 2018-03-20 MED ORDER — BISACODYL 5 MG PO TBEC
10.0000 mg | DELAYED_RELEASE_TABLET | Freq: Every day | ORAL | Status: DC
  Administered 2018-03-22: 10 mg via ORAL
  Filled 2018-03-20 (×2): qty 2

## 2018-03-20 NOTE — Progress Notes (Signed)
MEDICATION RELATED CONSULT NOTE -  Pharmacy Consult for N-Acetylcysteine Indication: Acetaminophen overdose  Allergies  Allergen Reactions  . Aspirin     Unknown  . Boniva [Ibandronic Acid]     Unknown  . Morphine     Unknown  . Nitrofuran Derivatives     Unknown  . Penicillins     Unknown reaction . Received Ancef without problem   . Sulfonamide Derivatives     Unknown   Labs: Recent Labs    03/19/18 0905 03/19/18 1141 03/19/18 2124 03/20/18 0220  WBC 9.4 7.2  --   --   HGB 10.2* 9.9*  --   --   HCT 31.5* 30.2*  --   --   PLT 329 314  --   --   CREATININE 1.35* 1.22* 0.77 0.76  ALBUMIN 3.9  --  3.2* 3.3*  PROT 7.1  --  6.7 6.6  AST 21  --  18 22  ALT 13*  --  14 13*  ALKPHOS 84  --  78 80  BILITOT 0.5  --  0.5 0.5   Estimated Creatinine Clearance: 44.6 mL/min (by C-G formula based on SCr of 0.76 mg/dL).   Assessment: 84 YO female presenting to ED after overdose of Percocet tablets PTA around 0200, APAP lvl of 103 roughly 7h post-ingestion, normal LFTs and INR.  [place] Control at 1120 03/19/2018 and received recommendations to treat with the following plan.  Evening labs were stable, Poison Control recommended stopped acetylcysteine. Repeat labs this morning are wnl and APAP level undetectable.  Plan:  Pharmacy will sign off, please reconsult as needed  Arrie Senate, PharmD, BCPS PGY-2 Cardiology Pharmacy Resident Pager: [phone] 03/20/2018

## 2018-03-20 NOTE — Consult Note (Signed)
[facility] Face-to-Face Psychiatry Consult   Reason for Consult:  overdose Referring Physician:  Dr. Rodena Piety Patient Identification: [name] MRN:  [id number] Principal Diagnosis: Acetaminophen overdose of undetermined intent Diagnosis:   Patient Active Problem List   Diagnosis Date Noted  . Acetaminophen overdose of undetermined intent [T39.1X4A] 03/19/2018  . Chronic lower back pain [M54.5, G89.29] 03/19/2018  . Acetaminophen overdose, intentional self-harm, initial encounter ([place]) [T39.1X2A] 03/19/2018  . Constipation [K59.00]   . Generalized abdominal pain [R10.84]   . OSA (obstructive sleep apnea) [G47.33] 10/06/2016  . Chest pain [R07.9] 08/05/2015  . UTI (urinary tract infection) [N39.0] 05/06/2014  . Essential hypertension, benign [I10] 05/06/2014  . Humerus fracture [S42.309A] 05/06/2014  . Dizziness [R42] 05/05/2014  . Fall [W19.XXXA] 05/05/2014  . External hemorrhoid, thrombosed-left [K64.5] 01/24/2013  . Acute blood loss anemia [D62] 07/01/2012  . S/P left TH revision [Z96.649] 06/28/2012  . Hyponatremia [E87.1] 06/11/2012  . Hip fx ([place]) [Y65.993T] 06/09/2012  . Leukocytosis [D72.829] 06/09/2012  . MITRAL VALVE DISORDERS [I05.9] 02/10/2010  . MURMUR [R01.1] 01/16/2010  . GERD [K21.9] 01/15/2010  . Osteoarthritis [M19.90] 01/15/2010  . BACK PAIN, CHRONIC [M54.9] 01/15/2010  . OSTEOPOROSIS [M81.0] 01/15/2010  . CHEST DISCOMFORT [R07.89] 01/15/2010  . GASTROINTESTINAL HEMORRHAGE, HX OF [Z87.19] 01/15/2010    Total Time spent with patient: 45 minutes  Subjective:   [name] is a 84 y.o. female patient admitted after overdosing on her pain medication.  HPI: Patient with long history of Chronic back pain who was brought to [facility] hospital by her family for altered mental status after she overdosed on an unknown amount of her pain medication. Patient is a good historian but she was interviewed with her permission in the presence of her Adult daughter at her  bedside.She denies any prior history of mental illness or suicide attempt. She states that she lives with her son and able to take care of her self with the assistance of multiple assistive devices including a walker at baseline, she is able to take her medications, bath, dress and fix her meals. Patient reports that she took more percocet than prescribed because she was having severe pain that did not respond to her normal dose. She vehemently denies attempting to kill herself, even though she says she is tired of living with pain. Patient's daughter states that she believes that her mother was not trying to kill herself. However, she says the family will need to request for more assistance for her when she is medically stable. The family we want patient placed is skill nursing facility upon discharge. Patient is alert and oriented x3. She denies depression, psychosis, delusional thinking, suicidal/homicidal ideation, intent or plan.  Risk to Self: Is patient at risk for suicide?: denies Risk to Others:  denies Prior Inpatient Therapy:  none Prior Outpatient Therapy:  none  Past Medical History:  Past Medical History:  Diagnosis Date  . Actinic keratosis   . Allergic rhinitis   . Back pain   . Chronic constipation   . GERD (gastroesophageal reflux disease)   . Hyperlipidemia   . Hypertension   . Hyponatremia   . Leg edema   . Macular degeneration   . Mitral regurgitation    mild by echo 2003  . Osteoarthritis   . Osteoarthrosis   . Osteoporosis   . Tenosynovitis    myxomatous    Past Surgical History:  Procedure Laterality Date  . CARPAL TUNNEL RELEASE     bilateral  . hernial repain    .  INGUINAL HERNIA REPAIR     laparoscopic preperitoneal repair of bilateral inguinal hernias  . REPLACEMENT TOTAL KNEE BILATERAL    . REVERSE SHOULDER ARTHROPLASTY Right 05/08/2014   Procedure: REVERSE SHOULDER ARTHROPLASTY RIGHT ;  Surgeon: Marin Shutter, MD;  Location: [place];  Service:  Orthopedics;  Laterality: Right;  . SYNOVECTOMY     of right long finger  . tonisellectomy    . tonisillectomy    . TONSILLECTOMY     Family History:  Family History  Problem Relation Age of Onset  . Heart attack Father   . Heart attack Brother    Family Psychiatric  History: denies Social History:  Social History   Substance and Sexual Activity  Alcohol Use Yes  . Alcohol/week: 1.2 oz  . Types: 2 Glasses of wine per week   Comment: 2 glasses per week     Social History   Substance and Sexual Activity  Drug Use No    Social History   Socioeconomic History  . Marital status: Divorced    Spouse name: Not on file  . Number of children: 4  . Years of education: Not on file  . Highest education level: Not on file  Occupational History  . Not on file  Social Needs  . Financial resource strain: Not on file  . Food insecurity:    Worry: Not on file    Inability: Not on file  . Transportation needs:    Medical: Not on file    Non-medical: Not on file  Tobacco Use  . Smoking status: Former Smoker    Packs/day: 1.00    Years: 2.00    Pack years: 2.00    Types: Cigarettes    Last attempt to quit: 12/07/1967    Years since quitting: 50.3  . Smokeless tobacco: Never Used  Substance and Sexual Activity  . Alcohol use: Yes    Alcohol/week: 1.2 oz    Types: 2 Glasses of wine per week    Comment: 2 glasses per week  . Drug use: No  . Sexual activity: Never  Lifestyle  . Physical activity:    Days per week: Not on file    Minutes per session: Not on file  . Stress: Not on file  Relationships  . Social connections:    Talks on phone: Not on file    Gets together: Not on file    Attends religious service: Not on file    Active member of club or organization: Not on file    Attends meetings of clubs or organizations: Not on file    Relationship status: Not on file  Other Topics Concern  . Not on file  Social History Narrative  . Not on file   Additional Social  History:    Allergies:   Allergies  Allergen Reactions  . Aspirin     Unknown  . Boniva [Ibandronic Acid]     Unknown  . Morphine     Unknown  . Nitrofuran Derivatives     Unknown  . Penicillins     Unknown reaction . Received Ancef without problem   . Sulfonamide Derivatives     Unknown    Labs:  Results for orders placed or performed during the hospital encounter of 03/19/18 (from the past 48 hour(s))  Comprehensive metabolic panel     Status: Abnormal   Collection Time: 03/19/18  9:05 AM  Result Value Ref Range   Sodium 129 (L) 135 - 145 mmol/L  Potassium 4.0 3.5 - 5.1 mmol/L   Chloride 97 (L) 101 - 111 mmol/L   CO2 19 (L) 22 - 32 mmol/L   Glucose, Bld 154 (H) 65 - 99 mg/dL   BUN 28 (H) 6 - 20 mg/dL   Creatinine, Ser 1.35 (H) 0.44 - 1.00 mg/dL   Calcium 9.0 8.9 - 10.3 mg/dL   Total Protein 7.1 6.5 - 8.1 g/dL   Albumin 3.9 3.5 - 5.0 g/dL   AST 21 15 - 41 U/L   ALT 13 (L) 14 - 54 U/L   Alkaline Phosphatase 84 38 - 126 U/L   Total Bilirubin 0.5 0.3 - 1.2 mg/dL   GFR calc non Af Amer 34 (L) >60 mL/min   GFR calc Af Amer 39 (L) >60 mL/min    Comment: (NOTE) The eGFR has been calculated using the CKD EPI equation. This calculation has not been validated in all clinical situations. eGFR's persistently <60 mL/min signify possible Chronic Kidney Disease.    Anion gap 13 5 - 15    Comment: Performed at [facility] [address] [place], [place] [address]  Lipase, blood     Status: None   Collection Time: 03/19/18  9:05 AM  Result Value Ref Range   Lipase 26 11 - 51 U/L    Comment: Performed at [facility] [address] [place], [place] [address]  CBC with Differential     Status: Abnormal   Collection Time: 03/19/18  9:05 AM  Result Value Ref Range   WBC 9.4 4.0 - 10.5 K/uL   RBC 3.39 (L) 3.87 - 5.11 MIL/uL   Hemoglobin 10.2 (L) 12.0 - 15.0 g/dL   HCT 31.5 (L) 36.0 - 46.0 %   MCV 92.9 78.0 - 100.0 fL   MCH 30.1 26.0 - 34.0 pg   MCHC 32.4  30.0 - 36.0 g/dL   RDW 13.1 11.5 - 15.5 %   Platelets 329 150 - 400 K/uL   Neutrophils Relative % 72 %   Neutro Abs 6.8 1.7 - 7.7 K/uL   Lymphocytes Relative 20 %   Lymphs Abs 1.8 0.7 - 4.0 K/uL   Monocytes Relative 7 %   Monocytes Absolute 0.6 0.1 - 1.0 K/uL   Eosinophils Relative 1 %   Eosinophils Absolute 0.1 0.0 - 0.7 K/uL   Basophils Relative 0 %   Basophils Absolute 0.0 0.0 - 0.1 K/uL    Comment: Performed at [facility] [address], [place], [place] [address]  Acetaminophen level     Status: Abnormal   Collection Time: 03/19/18  9:05 AM  Result Value Ref Range   Acetaminophen (Tylenol), Serum 103 (H) 10 - 30 ug/mL    Comment:        THERAPEUTIC CONCENTRATIONS VARY SIGNIFICANTLY. A RANGE OF 10-30 ug/mL MAY BE AN EFFECTIVE CONCENTRATION FOR MANY PATIENTS. HOWEVER, SOME ARE BEST TREATED AT CONCENTRATIONS OUTSIDE THIS RANGE. ACETAMINOPHEN CONCENTRATIONS >150 ug/mL AT 4 HOURS AFTER INGESTION AND >50 ug/mL AT 12 HOURS AFTER INGESTION ARE OFTEN ASSOCIATED WITH TOXIC REACTIONS. Performed at [facility], [place] [address] [place], [place] [address]   Salicylate level     Status: None   Collection Time: 03/19/18  9:05 AM  Result Value Ref Range   Salicylate Lvl <0.3 2.8 - 30.0 mg/dL    Comment: Performed at [facility] [address], [place], [place] [address]  Protime-INR     Status: None   Collection  Time: 03/19/18  9:05 AM  Result Value Ref Range   Prothrombin Time 13.1 11.4 - 15.2 seconds   INR 1.00     Comment: Performed at [facility] [address] [place], [place] [address]  CBC     Status: Abnormal   Collection Time: 03/19/18 11:41 AM  Result Value Ref Range   WBC 7.2 4.0 - 10.5 K/uL   RBC 3.26 (L) 3.87 - 5.11 MIL/uL   Hemoglobin 9.9 (L) 12.0 - 15.0 g/dL   HCT 30.2 (L) 36.0 - 46.0 %   MCV 92.6 78.0 - 100.0 fL   MCH 30.4 26.0 - 34.0 pg   MCHC 32.8 30.0 - 36.0 g/dL   RDW 13.1 11.5 - 15.5 %   Platelets 314 150 - 400 K/uL     Comment: Performed at [facility], [place] [address], [place], [place] [address]  Creatinine, serum     Status: Abnormal   Collection Time: 03/19/18 11:41 AM  Result Value Ref Range   Creatinine, Ser 1.22 (H) 0.44 - 1.00 mg/dL   GFR calc non Af Amer 38 (L) >60 mL/min   GFR calc Af Amer 44 (L) >60 mL/min    Comment: (NOTE) The eGFR has been calculated using the CKD EPI equation. This calculation has not been validated in all clinical situations. eGFR's persistently <60 mL/min signify possible Chronic Kidney Disease. Performed at [facility], [place] [address], [place], [place] [address]   Acetaminophen level (recheck)     Status: Abnormal   Collection Time: 03/19/18 11:45 AM  Result Value Ref Range   Acetaminophen (Tylenol), Serum 80 (H) 10 - 30 ug/mL    Comment:        THERAPEUTIC CONCENTRATIONS VARY SIGNIFICANTLY. A RANGE OF 10-30 ug/mL MAY BE AN EFFECTIVE CONCENTRATION FOR MANY PATIENTS. HOWEVER, SOME ARE BEST TREATED AT CONCENTRATIONS OUTSIDE THIS RANGE. ACETAMINOPHEN CONCENTRATIONS >150 ug/mL AT 4 HOURS AFTER INGESTION AND >50 ug/mL AT 12 HOURS AFTER INGESTION ARE OFTEN ASSOCIATED WITH TOXIC REACTIONS. Performed at [facility], [place] [address], [place], [place] [address]   MRSA PCR Screening     Status: None   Collection Time: 03/19/18  5:06 PM  Result Value Ref Range   MRSA by PCR NEGATIVE NEGATIVE    Comment:        The GeneXpert MRSA Assay (FDA approved for NASAL specimens only), is one component of a comprehensive MRSA colonization surveillance program. It is not intended to diagnose MRSA infection nor to guide or monitor treatment for MRSA infections. Performed at [facility], [place] [address] [place], [place] [address]   Acetaminophen level     Status: None   Collection Time: 03/19/18  9:24 PM  Result Value Ref Range   Acetaminophen (Tylenol), Serum 14 10 - 30 ug/mL    Comment:        THERAPEUTIC CONCENTRATIONS VARY SIGNIFICANTLY.  A RANGE OF 10-30 ug/mL MAY BE AN EFFECTIVE CONCENTRATION FOR MANY PATIENTS. HOWEVER, SOME ARE BEST TREATED AT CONCENTRATIONS OUTSIDE THIS RANGE. ACETAMINOPHEN CONCENTRATIONS >150 ug/mL AT 4 HOURS AFTER INGESTION AND >50 ug/mL AT 12 HOURS AFTER INGESTION ARE OFTEN ASSOCIATED WITH TOXIC REACTIONS. Performed at [facility], [place] [address], [place], [place] [address]   Protime-INR     Status: None   Collection Time: 03/19/18  9:24 PM  Result Value Ref Range   Prothrombin Time 14.8 11.4 - 15.2 seconds   INR 1.17  Comment: Performed at [facility], [place] [address] [place], [place] [address]  Comprehensive metabolic panel     Status: Abnormal   Collection Time: 03/19/18  9:24 PM  Result Value Ref Range   Sodium 132 (L) 135 - 145 mmol/L   Potassium 4.4 3.5 - 5.1 mmol/L   Chloride 101 101 - 111 mmol/L   CO2 20 (L) 22 - 32 mmol/L   Glucose, Bld 125 (H) 65 - 99 mg/dL   BUN 20 6 - 20 mg/dL   Creatinine, Ser 0.77 0.44 - 1.00 mg/dL   Calcium 8.4 (L) 8.9 - 10.3 mg/dL   Total Protein 6.7 6.5 - 8.1 g/dL   Albumin 3.2 (L) 3.5 - 5.0 g/dL   AST 18 15 - 41 U/L   ALT 14 14 - 54 U/L   Alkaline Phosphatase 78 38 - 126 U/L   Total Bilirubin 0.5 0.3 - 1.2 mg/dL   GFR calc non Af Amer >60 >60 mL/min   GFR calc Af Amer >60 >60 mL/min    Comment: (NOTE) The eGFR has been calculated using the CKD EPI equation. This calculation has not been validated in all clinical situations. eGFR's persistently <60 mL/min signify possible Chronic Kidney Disease.    Anion gap 11 5 - 15    Comment: Performed at [facility] [address], [place], [place] [address]  Acetaminophen level     Status: Abnormal   Collection Time: 03/20/18  2:20 AM  Result Value Ref Range   Acetaminophen (Tylenol), Serum <10 (L) 10 - 30 ug/mL    Comment:        THERAPEUTIC CONCENTRATIONS VARY SIGNIFICANTLY. A RANGE OF 10-30 ug/mL MAY BE AN EFFECTIVE CONCENTRATION FOR MANY PATIENTS. HOWEVER, SOME ARE BEST  TREATED AT CONCENTRATIONS OUTSIDE THIS RANGE. ACETAMINOPHEN CONCENTRATIONS >150 ug/mL AT 4 HOURS AFTER INGESTION AND >50 ug/mL AT 12 HOURS AFTER INGESTION ARE OFTEN ASSOCIATED WITH TOXIC REACTIONS. Performed at [facility], [place] [address] [place], [place] [address]   Comprehensive metabolic panel     Status: Abnormal   Collection Time: 03/20/18  2:20 AM  Result Value Ref Range   Sodium 132 (L) 135 - 145 mmol/L   Potassium 4.1 3.5 - 5.1 mmol/L   Chloride 102 101 - 111 mmol/L   CO2 18 (L) 22 - 32 mmol/L   Glucose, Bld 95 65 - 99 mg/dL   BUN 17 6 - 20 mg/dL   Creatinine, Ser 0.76 0.44 - 1.00 mg/dL   Calcium 8.3 (L) 8.9 - 10.3 mg/dL   Total Protein 6.6 6.5 - 8.1 g/dL   Albumin 3.3 (L) 3.5 - 5.0 g/dL   AST 22 15 - 41 U/L   ALT 13 (L) 14 - 54 U/L   Alkaline Phosphatase 80 38 - 126 U/L   Total Bilirubin 0.5 0.3 - 1.2 mg/dL   GFR calc non Af Amer >60 >60 mL/min   GFR calc Af Amer >60 >60 mL/min    Comment: (NOTE) The eGFR has been calculated using the CKD EPI equation. This calculation has not been validated in all clinical situations. eGFR's persistently <60 mL/min signify possible Chronic Kidney Disease.    Anion gap 12 5 - 15    Comment: Performed at [facility] [address], [place], [place] [address]  Protime-INR     Status: None   Collection Time: 03/20/18  2:20 AM  Result Value Ref Range   Prothrombin Time 14.3 11.4 - 15.2  seconds   INR 1.12     Comment: Performed at [facility], [place] [address], [place], [place] [address]    Current Facility-Administered Medications  Medication Dose Route Frequency Provider Last Rate Last Dose  . 0.9 %  sodium chloride infusion   Intravenous Continuous Georgette Shell, MD 75 mL/hr at 03/20/18 1300    . bisacodyl (DULCOLAX) EC tablet 10 mg  10 mg Oral Daily Georgette Shell, MD      . enoxaparin (LOVENOX) injection 30 mg  30 mg Subcutaneous Q24H Bonnell Public Tublu, MD   30 mg at 03/19/18 2145   . gabapentin (NEURONTIN) capsule 100 mg  100 mg Oral QHS Bonnell Public Tublu, MD   100 mg at 03/19/18 2145  . ondansetron (ZOFRAN) injection 4 mg  4 mg Intravenous Q4H PRN Vashti Hey, MD   4 mg at 03/19/18 2145  . oxyCODONE (Oxy IR/ROXICODONE) immediate release tablet 5 mg  5 mg Oral Q6H PRN Bonnell Public Tublu, MD      . pantoprazole (PROTONIX) EC tablet 40 mg  40 mg Oral Daily Bonnell Public Tublu, MD   40 mg at 03/20/18 0844  . sodium chloride 0.9 % bolus 2,000 mL  2,000 mL Intravenous Once Bonnell Public Tublu, MD      . trimethoprim (TRIMPEX) tablet 100 mg  100 mg Oral Daily Bonnell Public Tublu, MD   100 mg at 03/20/18 0845    Musculoskeletal: Strength & Muscle Tone: not tested Gait & Station: unsteady Patient leans: N/A  Psychiatric Specialty Exam: Physical Exam  Psychiatric: She has a normal mood and affect. Her speech is normal and behavior is normal. Judgment and thought content normal. Cognition and memory are normal.    Review of Systems  Constitutional: Positive for malaise/fatigue.  HENT: Positive for hearing loss.   Eyes: Negative.   Respiratory: Negative.   Cardiovascular: Negative.   Gastrointestinal: Negative.   Genitourinary: Negative.   Musculoskeletal: Positive for back pain.  Skin: Negative.   Neurological: Negative.   Endo/Heme/Allergies: Negative.   Psychiatric/Behavioral: Negative.     Blood pressure 117/66, pulse 76, temperature 98.8 F (37.1 C), temperature source Oral, resp. rate 15, height 5' 6"  (1.676 m), weight 68 kg (150 lb), SpO2 98 %.Body mass index is 24.21 kg/m.  General Appearance: Casual  Eye Contact:  Good  Speech:  Clear and Coherent  Volume:  Normal  Mood:  Euthymic  Affect:  Appropriate  Thought Process:  Coherent and Descriptions of Associations: Intact  Orientation:  Full (Time, Place, and Person)  Thought Content:  Logical  Suicidal Thoughts:  No  Homicidal Thoughts:  No  Memory:   Immediate;   Good Recent;   Fair Remote;   Good  Judgement:  Intact  Insight:  Fair  Psychomotor Activity:  Psychomotor Retardation  Concentration:  Concentration: Good and Attention Span: Good  Recall:  Good  Fund of Knowledge:  Good  Language:  Good  Akathisia:  No  Handed:  Right  AIMS (if indicated):     Assets:  Communication Skills Desire for Improvement Housing Social Support  ADL's:  marginal  Cognition:  WNL  Sleep:   fair     Treatment Plan Summary: 84 year old woman who denies prior history of mental illness but she was brought to the hospital for treatment after she took more pain medication than prescribed due to severe pain. However, patient is alert, oriented x 3 and denies SI/HI. Daughter at the bedside does  not believe patient is a danger to herself or anybody else. Recommendations: -No psychiatric medication require at this time. -Patient may benefit from SNF placement upon discharge. -Psychiatric service is signing off.  Disposition: Re-consult psych service if needed in the future.  Corena Pilgrim, MD 03/20/2018 2:09 PM

## 2018-03-20 NOTE — Progress Notes (Signed)
PT Cancellation Note  Patient Details Name: [name] MRN: [id number] DOB: 12-Jan-1926   Cancelled Treatment:    Reason Eval/Treat Not Completed: Other (comment)(patient had just returned to bed wiht nursing, deferred eval today)   Shanna Cisco 03/20/2018, 3:43 PM

## 2018-03-20 NOTE — Progress Notes (Addendum)
PROGRESS NOTE    [name]  YPP:[id number] DOB: Jul 07, 1926 DOA: 03/19/2018 PCP: Deland Pretty, MD  Brief Narrative:  84 y.o. female with past medical history significant for chronic pain who is brought in by her family for altered mental status. History is per patient, ED note and patient's family.  Patient has chronic lower back pain for many years for which she is on Percocet one tab by mouth 4 times a day. She lives by herself in her son's house and is able to take care of herself with the assistance of multiple assistive devices including a walker at baseline including getting out of bed, bathing and dressing and fixing her meals. Patient was in her usual state of relatively poor health until this morning when her son found her to be unusually asleep and was unable to arouse her. Patient states that she had some much pain last night that she took all of her Percocet tablets. This is estimated to be around 60 tablets. When I asked patient specifically whether she knew that this was a bad idea and could be dangerous she said yes she did know that. She had previously told her family that she "wanted to go to heaven and be with my son". Her son committed suicide in 2017. When I asked patient whether she knew that she wouldn't wake up she said yes and when I asked her if she wanted to wake up she said no she didn't want to wake up.  Patient's daughter and son are her healthcare power of attorney. They're both clear that patient is DO NOT RESUSCITATE and wants no heroic measures at all.However they do want her to get treatment for the Tylenol overdose. I discussed this with the patient and she said that was okay. She is willing to get IV fluids and antibiotics and any other IV medications as needed. ED Course:  The patient was noted to have a Tylenol level of 103 approximately 7 hours after probable ingestion. Patient and family thinks that she was fine until 2 and patient think she probably took all  of her Percocets at 2 AM. Dr. Melina Copa called poison control who noted that she has "moderate levels" and does meet criteria for an acetylcysteine. Dr. Melina Copa ordered loading dose of an acetylcysteine at 150 mg/kg to be given over 60 minutes.      Assessment & Plan:   Principal Problem:   Acetaminophen overdose of undetermined intent Active Problems:   Essential hypertension, benign   OSA (obstructive sleep apnea)   Chronic lower back pain   Acetaminophen overdose, intentional self-harm, initial encounter ([place])  1] Tylenol overdose intentional status post treatment with N-acetylcysteine.  This morning her liver function test coagulation studies are normal.  Tylenol level is less than 10.  2] chronic pain currently on oxycodone as needed.  She denies any pain at this time when I saw her this morning.  3] status post suicide attempt with  Percocet.  Will consult psych.  4] hyponatremia stable avoid hypotonic fluids and follow-up labs as needed.  5] constipation dulcolax   DVT prophylaxis: lovenox Code Status:dnr Family Communication:none Disposition Plan:  tbd  Consultants: poison control  Procedures none Antimicrobials: none Subjective:feels ok.no pain.didint sleep well. No bm for 2 days. Objective: Vitals:   03/19/18 1918 03/19/18 2249 03/20/18 0420 03/20/18 0711  BP: (!) 163/82 (!) 156/91 123/74 124/65  Pulse: 91 91 93 81  Resp: 14 11 20 14   Temp: (!) 97.5 F (36.4 C) 98.1  F (36.7 C) 98.9 F (37.2 C) 98.8 F (37.1 C)  TempSrc: Oral Oral Oral Oral  SpO2: 99% 99% 99% 97%  Weight:      Height:        Intake/Output Summary (Last 24 hours) at 03/20/2018 0925 Last data filed at 03/20/2018 0610 Gross per 24 hour  Intake 2916.9 ml  Output 802 ml  Net 2114.9 ml   Filed Weights   03/19/18 1105  Weight: 68 kg (150 lb)    Examination:  General exam: Appears calm and comfortable  Respiratory system: Clear to auscultation. Respiratory effort  normal. Cardiovascular system: S1 & S2 heard, RRR. No JVD, murmurs, rubs, gallops or clicks. No pedal edema. Gastrointestinal system: Abdomen is nondistended, soft and nontender. No organomegaly or masses felt. Normal bowel sounds heard. Central nervous system: Alert and oriented. No focal neurological deficits. Extremities  trace edema b/l Skin: No rashes, lesions or ulcers Psychiatry: Judgement and insight appear normal. Mood & affect appropriate.     Data Reviewed: I have personally reviewed following labs and imaging studies  CBC: Recent Labs  Lab 03/19/18 0905 03/19/18 1141  WBC 9.4 7.2  NEUTROABS 6.8  --   HGB 10.2* 9.9*  HCT 31.5* 30.2*  MCV 92.9 92.6  PLT 329 725   Basic Metabolic Panel: Recent Labs  Lab 03/19/18 0905 03/19/18 1141 03/19/18 2124 03/20/18 0220  NA 129*  --  132* 132*  K 4.0  --  4.4 4.1  CL 97*  --  101 102  CO2 19*  --  20* 18*  GLUCOSE 154*  --  125* 95  BUN 28*  --  20 17  CREATININE 1.35* 1.22* 0.77 0.76  CALCIUM 9.0  --  8.4* 8.3*   GFR: Estimated Creatinine Clearance: 44.6 mL/min (by C-G formula based on SCr of 0.76 mg/dL). Liver Function Tests: Recent Labs  Lab 03/19/18 0905 03/19/18 2124 03/20/18 0220  AST 21 18 22   ALT 13* 14 13*  ALKPHOS 84 78 80  BILITOT 0.5 0.5 0.5  PROT 7.1 6.7 6.6  ALBUMIN 3.9 3.2* 3.3*   Recent Labs  Lab 03/19/18 0905  LIPASE 26   No results for input(s): AMMONIA in the last 168 hours. Coagulation Profile: Recent Labs  Lab 03/19/18 0905 03/19/18 2124 03/20/18 0220  INR 1.00 1.17 1.12   Cardiac Enzymes: No results for input(s): CKTOTAL, CKMB, CKMBINDEX, TROPONINI in the last 168 hours. BNP (last 3 results) No results for input(s): PROBNP in the last 8760 hours. HbA1C: No results for input(s): HGBA1C in the last 72 hours. CBG: No results for input(s): GLUCAP in the last 168 hours. Lipid Profile: No results for input(s): CHOL, HDL, LDLCALC, TRIG, CHOLHDL, LDLDIRECT in the last 72  hours. Thyroid Function Tests: No results for input(s): TSH, T4TOTAL, FREET4, T3FREE, THYROIDAB in the last 72 hours. Anemia Panel: No results for input(s): VITAMINB12, FOLATE, FERRITIN, TIBC, IRON, RETICCTPCT in the last 72 hours. Sepsis Labs: No results for input(s): PROCALCITON, LATICACIDVEN in the last 168 hours.  Recent Results (from the past 240 hour(s))  MRSA PCR Screening     Status: None   Collection Time: 03/19/18  5:06 PM  Result Value Ref Range Status   MRSA by PCR NEGATIVE NEGATIVE Final    Comment:        The GeneXpert MRSA Assay (FDA approved for NASAL specimens only), is one component of a comprehensive MRSA colonization surveillance program. It is not intended to diagnose MRSA infection nor to guide or monitor  treatment for MRSA infections. Performed at [facility], [place] [address] [place], [place] [address]          Radiology Studies: Dg Chest 1 View  Result Date: 03/19/2018 CLINICAL DATA:  Encounter for aspiration into the airway and constipation Per ED notes Patient stated she took a "hand full" of Oxycodone APAP 5-325 pills because she is "tired of the pain". EXAM: CHEST  1 VIEW COMPARISON:  05/22/2016, 11/04/2015 FINDINGS: Heart is enlarged. There is significant retrocardiac density. Five the prior studies, patient does have a large hiatal hernia and some of this opacity may be related to the hernia. Is difficult to know exclude LEFT LOWER lobe infiltrate. Suspect early RIGHT LOWER lobe infiltrate or atelectasis. There is no pulmonary edema. There is mild airway thickening. IMPRESSION: 1. Cardiomegaly without pulmonary edema. 2. RIGHT LOWER lobe atelectasis or early infiltrate. 3. Significant opacity at the LEFT lung base is partially due to the known hiatal hernia. Recommend PA and LATERAL chest x-ray to evaluate the LEFT LOWER lobe. Electronically Signed   By: Nolon Nations M.D.   On: 03/19/2018 12:31   Dg Abd 1 View  Result Date:  03/19/2018 CLINICAL DATA:  Encounter for aspiration into the airway and constipation Per ED notes Patient stated she took a "hand full" of Oxycodone APAP 5-325 pills because she is "tired of the pain". EXAM: ABDOMEN - 1 VIEW COMPARISON:  CT of the abdomen on 05/20/2016 FINDINGS: There is convex LEFT scoliosis of the midthoracic spine. Associated degenerative changes are present. Prior LEFT hip arthroplasty. Bowel gas pattern is nonobstructed. Minimal stool burden. There is no evidence for organomegaly. Grommets consistent with prior hernia repair overlies the LOWER abdomen. Consistent previous hernia repair IMPRESSION: No evidence for acute  abnormality. Electronically Signed   By: Nolon Nations M.D.   On: 03/19/2018 12:34        Scheduled Meds: . enoxaparin (LOVENOX) injection  30 mg Subcutaneous Q24H  . gabapentin  100 mg Oral QHS  . pantoprazole  40 mg Oral Daily  . trimethoprim  100 mg Oral Daily   Continuous Infusions: . sodium chloride 125 mL/hr at 03/19/18 1900  . sodium chloride       LOS: 1 day      Georgette Shell, MD [facility]  If 7PM-7AM, please contact night-coverage [email or web address] Password [facility] 03/20/2018, 9:25 AM

## 2018-03-21 ENCOUNTER — Encounter (HOSPITAL_COMMUNITY): Payer: Self-pay

## 2018-03-21 DIAGNOSIS — T391X4D Poisoning by 4-Aminophenol derivatives, undetermined, subsequent encounter: Secondary | ICD-10-CM

## 2018-03-21 LAB — COMPREHENSIVE METABOLIC PANEL
ALT: 13 U/L — AB (ref 14–54)
AST: 21 U/L (ref 15–41)
Albumin: 3 g/dL — ABNORMAL LOW (ref 3.5–5.0)
Alkaline Phosphatase: 68 U/L (ref 38–126)
Anion gap: 7 (ref 5–15)
BILIRUBIN TOTAL: 0.4 mg/dL (ref 0.3–1.2)
BUN: 10 mg/dL (ref 6–20)
CO2: 20 mmol/L — ABNORMAL LOW (ref 22–32)
Calcium: 8.2 mg/dL — ABNORMAL LOW (ref 8.9–10.3)
Chloride: 104 mmol/L (ref 101–111)
Creatinine, Ser: 0.61 mg/dL (ref 0.44–1.00)
GFR calc Af Amer: >60 mL/min (ref >60)
Glucose, Bld: 94 mg/dL (ref 65–99)
Potassium: 3.6 mmol/L (ref 3.5–5.1)
Sodium: 131 mmol/L — ABNORMAL LOW (ref 135–145)
TOTAL PROTEIN: 5.7 g/dL — AB (ref 6.5–8.1)

## 2018-03-21 LAB — CBC WITH DIFFERENTIAL/PLATELET
BASOS PCT: 1 %
Basophils Absolute: 0 10*3/uL (ref 0.0–0.1)
Eosinophils Absolute: 0.2 10*3/uL (ref 0.0–0.7)
Eosinophils Relative: 2 %
HEMATOCRIT: 29.3 % — AB (ref 36.0–46.0)
Hemoglobin: 9.8 g/dL — ABNORMAL LOW (ref 12.0–15.0)
Lymphocytes Relative: 23 %
Lymphs Abs: 1.7 10*3/uL (ref 0.7–4.0)
MCH: 30.5 pg (ref 26.0–34.0)
MCHC: 33.4 g/dL (ref 30.0–36.0)
MCV: 91.3 fL (ref 78.0–100.0)
MONOS PCT: 8 %
Monocytes Absolute: 0.6 10*3/uL (ref 0.1–1.0)
NEUTROS ABS: 4.7 10*3/uL (ref 1.7–7.7)
Neutrophils Relative %: 66 %
Platelets: 316 10*3/uL (ref 150–400)
RBC: 3.21 MIL/uL — ABNORMAL LOW (ref 3.87–5.11)
RDW: 13.4 % (ref 11.5–15.5)
WBC: 7.1 10*3/uL (ref 4.0–10.5)

## 2018-03-21 LAB — PROTIME-INR
INR: 1.11
PROTHROMBIN TIME: 14.2 s (ref 11.4–15.2)

## 2018-03-21 MED ORDER — PANTOPRAZOLE SODIUM 40 MG PO TBEC: 40.0000 mg | DELAYED_RELEASE_TABLET | Freq: Every day | ORAL | Status: DC

## 2018-03-21 NOTE — Progress Notes (Signed)
Patient will discharge to [facility] Anticipated discharge date: 4/23 Family notified: pt dtr [facility] by Corey Harold- scheduled for 1:30pm Report #: [id number]  [place] signing off.  Jorge Ny, LCSW Clinical Social Worker [phone]

## 2018-03-21 NOTE — Progress Notes (Signed)
PROGRESS NOTE    [name]  EXB:[id number] DOB: [birth year] DOA: 03/19/2018 PCP: Deland Pretty, MD  Brief Narrative:84 y.o.femalewith past medical history significant for chronic pain who is brought in by her family for altered mental status. History is per patient, ED note and patient's family.  Patient has chronic lower back pain for many years for which she is on Percocet one tab by mouth 4 times a day. She lives by herself in her son's house and is able to take care of herself with the assistance of multiple assistive devices including a walker at baseline including getting out of bed, bathing and dressing and fixing her meals. Patient was in her usual state of relatively poor health until this morning when her son found her to be unusually asleep and was unable to arouse her. Patient states that she had some much pain last night that she took all of her Percocet tablets. This is estimated to be around 60 tablets. When I asked patient specifically whether she knew that this was a bad idea and could be dangerous she said yes she did know that. She had previously told her family that she "wanted to go to heaven and be with my son". Her son committed suicide in 2017. When I asked patient whether she knew that she wouldn't wake up she said yes and when I asked her if she wanted to wake up she said no she didn't want to wake up. Patient's daughter and son are her healthcare power of attorney. They're both clear that patient is DO NOT RESUSCITATE and wants no heroic measures at all.However they do want her to get treatment for the Tylenol overdose. I discussed this with the patient and she said that was okay. She is willing to get IV fluids and antibiotics and any other IV medications as needed. ED Course:The patient was noted to have a Tylenol level of 103 approximately 7 hours after probable ingestion. Patient and family thinks that she was fine until 2 and patient think she probably took all of  her Percocets at 2 AM. Dr. Melina Copa called poison control who noted that she has "moderate levels" and does meet criteria for an acetylcysteine. Dr. Melina Copa ordered loading dose of an acetylcysteine at 150 mg/kg to be given over 60 minutes.      Assessment & Plan:   Principal Problem:   Acetaminophen overdose of undetermined intent Active Problems:   Essential hypertension, benign   OSA (obstructive sleep apnea)   Chronic lower back pain   Acetaminophen overdose, intentional self-harm, initial encounter ([place])  1] Tylenol overdose  status post treatment with N-acetylcysteine.  This morning her liver function test coagulation studies are normal.  Tylenol level is less than 10.appreciate psych eval.  2] chronic pain currently on oxycodone as needed.  She denies any pain at this time when I saw her this morning.  4] hyponatremia stable   5] constipation dulcolax      DVT prophylaxis: lovenox Code Status:dnr Family Communication: dw daughter Disposition Plan:to snf  Consultants:psych  Procedures: none Antimicrobials:none  Subjective: Had bowel movements after stool softners.  Objective: Vitals:   03/20/18 1934 03/20/18 2333 03/21/18 0307 03/21/18 1215  BP: 102/62 121/78 136/82 128/75  Pulse: 84 79 67 77  Resp: 16 14 13 17   Temp: 98 F (36.7 C) 98.5 F (36.9 C) 98.6 F (37 C) 98.1 F (36.7 C)  TempSrc: Oral Oral Oral Oral  SpO2: 96% 96% 96% 97%  Weight:  Height:        Intake/Output Summary (Last 24 hours) at 03/21/2018 1529 Last data filed at 03/21/2018 1400 Gross per 24 hour  Intake 1530 ml  Output -  Net 1530 ml   Filed Weights   03/19/18 1105  Weight: 68 kg (150 lb)    Examination:  General exam: Appears calm and comfortable  Respiratory system: Clear to auscultation. Respiratory effort normal. Cardiovascular system: S1 & S2 heard, RRR. No JVD, murmurs, rubs, gallops or clicks. No pedal edema. Gastrointestinal system: Abdomen is  nondistended, soft and nontender. No organomegaly or masses felt. Normal bowel sounds heard. Central nervous system: Alert and oriented. No focal neurological deficits. Extremities: Symmetric 5 x 5 power. Skin: No rashes, lesions or ulcers Psychiatry: Judgement and insight appear normal. Mood & affect appropriate.     Data Reviewed: I have personally reviewed following labs and imaging studies  CBC: Recent Labs  Lab 03/19/18 0905 03/19/18 1141 03/21/18 0230  WBC 9.4 7.2 7.1  NEUTROABS 6.8  --  4.7  HGB 10.2* 9.9* 9.8*  HCT 31.5* 30.2* 29.3*  MCV 92.9 92.6 91.3  PLT 329 314 086   Basic Metabolic Panel: Recent Labs  Lab 03/19/18 0905 03/19/18 1141 03/19/18 2124 03/20/18 0220 03/21/18 0230  NA 129*  --  132* 132* 131*  K 4.0  --  4.4 4.1 3.6  CL 97*  --  101 102 104  CO2 19*  --  20* 18* 20*  GLUCOSE 154*  --  125* 95 94  BUN 28*  --  20 17 10   CREATININE 1.35* 1.22* 0.77 0.76 0.61  CALCIUM 9.0  --  8.4* 8.3* 8.2*   GFR: Estimated Creatinine Clearance: 44.6 mL/min (by C-G formula based on SCr of 0.61 mg/dL). Liver Function Tests: Recent Labs  Lab 03/19/18 0905 03/19/18 2124 03/20/18 0220 03/21/18 0230  AST 21 18 22 21   ALT 13* 14 13* 13*  ALKPHOS 84 78 80 68  BILITOT 0.5 0.5 0.5 0.4  PROT 7.1 6.7 6.6 5.7*  ALBUMIN 3.9 3.2* 3.3* 3.0*   Recent Labs  Lab 03/19/18 0905  LIPASE 26   No results for input(s): AMMONIA in the last 168 hours. Coagulation Profile: Recent Labs  Lab 03/19/18 0905 03/19/18 2124 03/20/18 0220 03/21/18 0230  INR 1.00 1.17 1.12 1.11   Cardiac Enzymes: No results for input(s): CKTOTAL, CKMB, CKMBINDEX, TROPONINI in the last 168 hours. BNP (last 3 results) No results for input(s): PROBNP in the last 8760 hours. HbA1C: No results for input(s): HGBA1C in the last 72 hours. CBG: No results for input(s): GLUCAP in the last 168 hours. Lipid Profile: No results for input(s): CHOL, HDL, LDLCALC, TRIG, CHOLHDL, LDLDIRECT in the last  72 hours. Thyroid Function Tests: No results for input(s): TSH, T4TOTAL, FREET4, T3FREE, THYROIDAB in the last 72 hours. Anemia Panel: No results for input(s): VITAMINB12, FOLATE, FERRITIN, TIBC, IRON, RETICCTPCT in the last 72 hours. Sepsis Labs: No results for input(s): PROCALCITON, LATICACIDVEN in the last 168 hours.  Recent Results (from the past 240 hour(s))  MRSA PCR Screening     Status: None   Collection Time: 03/19/18  5:06 PM  Result Value Ref Range Status   MRSA by PCR NEGATIVE NEGATIVE Final    Comment:        The GeneXpert MRSA Assay (FDA approved for NASAL specimens only), is one component of a comprehensive MRSA colonization surveillance program. It is not intended to diagnose MRSA infection nor to guide or monitor treatment  for MRSA infections. Performed at [facility], [place] [address] [place], [place] [address]          Radiology Studies: No results found.      Scheduled Meds: . bisacodyl  10 mg Oral Daily  . enoxaparin (LOVENOX) injection  30 mg Subcutaneous Q24H  . gabapentin  100 mg Oral QHS  . pantoprazole  40 mg Oral Daily  . pantoprazole  40 mg Oral Daily  . trimethoprim  100 mg Oral Daily   Continuous Infusions: . sodium chloride 75 mL/hr at 03/21/18 0942  . sodium chloride       LOS: 2 days      Georgette Shell, MD [facility]  If 7PM-7AM, please contact night-coverage [email or web address] Password [facility] 03/21/2018, 3:29 PM

## 2018-03-21 NOTE — Discharge Summary (Addendum)
Physician Discharge Summary  [name]:[id number] DOB: [birth year] DOA: 03/19/2018  PCP: Deland Pretty, MD  Admit date: 03/19/2018 Discharge date: 03/22/2018  Admitted From: Home Disposition: Skilled nursing facility Recommendations for Outpatient Follow-up:  1. Follow up with PCP in 1-2 weeks 2. Please obtain BMP/CBC in one week: Home Health: None Equipment/Devices: None Discharge Condition: Stable  CODE STATUS: Full code Diet recommendation: Cardiac  Brief/Interim Summary:84 y.o.femalewith past medical history significant for chronic pain who is brought in by her family for altered mental status. History is per patient, ED note and patient's family.  Patient has chronic lower back pain for many years for which she is on Percocet one tab by mouth 4 times a day. She lives by herself in her son's house and is able to take care of herself with the assistance of multiple assistive devices including a walker at baseline including getting out of bed, bathing and dressing and fixing her meals. Patient was in her usual state of relatively poor health until this morning when her son found her to be unusually asleep and was unable to arouse her. Patient states that she had some much pain last night that she took all of her Percocet tablets. This is estimated to be around 60 tablets. When I asked patient specifically whether she knew that this was a bad idea and could be dangerous she said yes she did know that. She had previously told her family that she "wanted to go to heaven and be with my son". Her son committed suicide in 2017. When I asked patient whether she knew that she wouldn't wake up she said yes and when I asked her if she wanted to wake up she said no she didn't want to wake up.  Patient's daughter and son are her healthcare power of attorney. They're both clear that patient is DO NOT RESUSCITATE and wants no heroic measures at all.However they do want her to get treatment for the  Tylenol overdose. I discussed this with the patient and she said that was okay. She is willing to get IV fluids and antibiotics and any other IV medications as needed. ED Course:The patient was noted to have a Tylenol level of 103 approximately 7 hours after probable ingestion. Patient and family thinks that she was fine until 2 and patient think she probably took all of her Percocets at 2 AM. Dr. Melina Copa called poison control who noted that she has "moderate levels" and does meet criteria for an acetylcysteine. Dr. Melina Copa ordered loading dose of an acetylcysteine at 150 mg/kg to be given over 60 minutes.     Discharge Diagnoses:  Principal Problem:   Acetaminophen overdose of undetermined intent Active Problems:   Essential hypertension, benign   OSA (obstructive sleep apnea)   Chronic lower back pain   Acetaminophen overdose, intentional self-harm, initial encounter ([place])  1] acetaminophen overdose of undetermined intent-patient was treated with N-acetylcysteine.  Liver function tests remain normal on the day of discharge.  Patient was seen by psychiatry and was deemed that she is not suicidal or homicidal.  Patient will be discharged to skilled nursing facility.  Discharge Instructions  Discharge Instructions    Call MD for:  difficulty breathing, headache or visual disturbances   Complete by:  As directed    Call MD for:  extreme fatigue   Complete by:  As directed    Call MD for:  persistant dizziness or light-headedness   Complete by:  As directed    Call  MD for:  persistant nausea and vomiting   Complete by:  As directed    Call MD for:  redness, tenderness, or signs of infection (pain, swelling, redness, odor or green/yellow discharge around incision site)   Complete by:  As directed    Call MD for:  severe uncontrolled pain   Complete by:  As directed    Diet - low sodium heart healthy   Complete by:  As directed    Increase activity slowly   Complete by:  As directed       Allergies as of 03/21/2018      Reactions   Aspirin    Unknown   Boniva [ibandronic Acid]    Unknown   Morphine    Unknown   Nitrofuran Derivatives    Unknown   Penicillins    Unknown reaction . Received Ancef without problem    Sulfonamide Derivatives    Unknown      Medication List    STOP taking these medications   oxyCODONE-acetaminophen 5-325 MG tablet Commonly known as:  PERCOCET/ROXICET     TAKE these medications   calcium citrate-vitamin D 315-200 MG-UNIT tablet Commonly known as:  CITRACAL+D Take 2 tablets by mouth daily.   cetirizine 10 MG tablet Commonly known as:  ZYRTEC Take 10 mg by mouth daily.   docusate sodium 100 MG capsule Commonly known as:  COLACE Take 100 mg by mouth 2 (two) times daily.   estradiol 0.1 MG/GM vaginal cream Commonly known as:  ESTRACE Place 1 Applicatorful vaginally 2 (two) times a week.   gabapentin 100 MG capsule Commonly known as:  NEURONTIN Take 100 mg by mouth at bedtime.   irbesartan 75 MG tablet Commonly known as:  AVAPRO Take 1 tablet (75 mg total) by mouth daily.   LUCENTIS IO Inject 1 Syringe into the eye every 7 (seven) weeks. Injection in right eye   omeprazole 20 MG capsule Commonly known as:  PRILOSEC Take 20 mg by mouth daily.   CENTRUM SILVER ULTRA WOMENS PO Take 1 tablet by mouth daily.   PRESERVISION AREDS 2 PO Take 2 tablets by mouth daily.   PROBIOTIC DAILY PO Take 1 tablet by mouth daily. Refresh Pro B What changed:  Another medication with the same name was removed. Continue taking this medication, and follow the directions you see here.   RECLAST 5 MG/100ML Soln injection Generic drug:  zoledronic acid Inject 5 mg into the vein See admin instructions. Once annually.   spironolactone 25 MG tablet Commonly known as:  ALDACTONE Take 25 mg by mouth once.   trimethoprim 100 MG tablet Commonly known as:  TRIMPEX Take 100 mg by mouth daily. UTI prophylaxis.       Allergies   Allergen Reactions  . Aspirin     Unknown  . Boniva [Ibandronic Acid]     Unknown  . Morphine     Unknown  . Nitrofuran Derivatives     Unknown  . Penicillins     Unknown reaction . Received Ancef without problem   . Sulfonamide Derivatives     Unknown    Consultations:  Psychiatry   Procedures/Studies: Dg Chest 1 View  Result Date: 03/19/2018 CLINICAL DATA:  Encounter for aspiration into the airway and constipation Per ED notes Patient stated she took a "hand full" of Oxycodone APAP 5-325 pills because she is "tired of the pain". EXAM: CHEST  1 VIEW COMPARISON:  05/22/2016, 11/04/2015 FINDINGS: Heart is enlarged. There is significant retrocardiac density.  Five the prior studies, patient does have a large hiatal hernia and some of this opacity may be related to the hernia. Is difficult to know exclude LEFT LOWER lobe infiltrate. Suspect early RIGHT LOWER lobe infiltrate or atelectasis. There is no pulmonary edema. There is mild airway thickening. IMPRESSION: 1. Cardiomegaly without pulmonary edema. 2. RIGHT LOWER lobe atelectasis or early infiltrate. 3. Significant opacity at the LEFT lung base is partially due to the known hiatal hernia. Recommend PA and LATERAL chest x-ray to evaluate the LEFT LOWER lobe. Electronically Signed   By: Nolon Nations M.D.   On: 03/19/2018 12:31   Dg Abd 1 View  Result Date: 03/19/2018 CLINICAL DATA:  Encounter for aspiration into the airway and constipation Per ED notes Patient stated she took a "hand full" of Oxycodone APAP 5-325 pills because she is "tired of the pain". EXAM: ABDOMEN - 1 VIEW COMPARISON:  CT of the abdomen on 05/20/2016 FINDINGS: There is convex LEFT scoliosis of the midthoracic spine. Associated degenerative changes are present. Prior LEFT hip arthroplasty. Bowel gas pattern is nonobstructed. Minimal stool burden. There is no evidence for organomegaly. Grommets consistent with prior hernia repair overlies the LOWER abdomen.  Consistent previous hernia repair IMPRESSION: No evidence for acute  abnormality. Electronically Signed   By: Nolon Nations M.D.   On: 03/19/2018 12:34    (Echo, Carotid, EGD, Colonoscopy, ERCP)    Subjective:   Discharge Exam: Vitals:   03/21/18 0307 03/21/18 1215  BP: 136/82 128/75  Pulse: 67 77  Resp: 13 17  Temp: 98.6 F (37 C) 98.1 F (36.7 C)  SpO2: 96% 97%   Vitals:   03/20/18 1934 03/20/18 2333 03/21/18 0307 03/21/18 1215  BP: 102/62 121/78 136/82 128/75  Pulse: 84 79 67 77  Resp: 16 14 13 17   Temp: 98 F (36.7 C) 98.5 F (36.9 C) 98.6 F (37 C) 98.1 F (36.7 C)  TempSrc: Oral Oral Oral Oral  SpO2: 96% 96% 96% 97%  Weight:      Height:        General: Pt is alert, awake, not in acute distress Cardiovascular: RRR, S1/S2 +, no rubs, no gallops Respiratory: CTA bilaterally, no wheezing, no rhonchi Abdominal: Soft, NT, ND, bowel sounds + Extremities: no edema, no cyanosis    The results of significant diagnostics from this hospitalization (including imaging, microbiology, ancillary and laboratory) are listed below for reference.     Microbiology: Recent Results (from the past 240 hour(s))  MRSA PCR Screening     Status: None   Collection Time: 03/19/18  5:06 PM  Result Value Ref Range Status   MRSA by PCR NEGATIVE NEGATIVE Final    Comment:        The GeneXpert MRSA Assay (FDA approved for NASAL specimens only), is one component of a comprehensive MRSA colonization surveillance program. It is not intended to diagnose MRSA infection nor to guide or monitor treatment for MRSA infections. Performed at [facility], [place] [address], [place], [place] [address]      Labs: BNP (last 3 results) No results for input(s): BNP in the last 8760 hours. Basic Metabolic Panel: Recent Labs  Lab 03/19/18 0905 03/19/18 1141 03/19/18 2124 03/20/18 0220 03/21/18 0230  NA 129*  --  132* 132* 131*  K 4.0  --  4.4 4.1 3.6  CL 97*  --  101 102 104   CO2 19*  --  20* 18* 20*  GLUCOSE 154*  --  125* 95  94  BUN 28*  --  20 17 10   CREATININE 1.35* 1.22* 0.77 0.76 0.61  CALCIUM 9.0  --  8.4* 8.3* 8.2*   Liver Function Tests: Recent Labs  Lab 03/19/18 0905 03/19/18 2124 03/20/18 0220 03/21/18 0230  AST 21 18 22 21   ALT 13* 14 13* 13*  ALKPHOS 84 78 80 68  BILITOT 0.5 0.5 0.5 0.4  PROT 7.1 6.7 6.6 5.7*  ALBUMIN 3.9 3.2* 3.3* 3.0*   Recent Labs  Lab 03/19/18 0905  LIPASE 26   No results for input(s): AMMONIA in the last 168 hours. CBC: Recent Labs  Lab 03/19/18 0905 03/19/18 1141 03/21/18 0230  WBC 9.4 7.2 7.1  NEUTROABS 6.8  --  4.7  HGB 10.2* 9.9* 9.8*  HCT 31.5* 30.2* 29.3*  MCV 92.9 92.6 91.3  PLT 329 314 316   Cardiac Enzymes: No results for input(s): CKTOTAL, CKMB, CKMBINDEX, TROPONINI in the last 168 hours. BNP: Invalid input(s): POCBNP CBG: No results for input(s): GLUCAP in the last 168 hours. D-Dimer No results for input(s): DDIMER in the last 72 hours. Hgb A1c No results for input(s): HGBA1C in the last 72 hours. Lipid Profile No results for input(s): CHOL, HDL, LDLCALC, TRIG, CHOLHDL, LDLDIRECT in the last 72 hours. Thyroid function studies No results for input(s): TSH, T4TOTAL, T3FREE, THYROIDAB in the last 72 hours.  Invalid input(s): FREET3 Anemia work up No results for input(s): VITAMINB12, FOLATE, FERRITIN, TIBC, IRON, RETICCTPCT in the last 72 hours. Urinalysis    Component Value Date/Time   COLORURINE YELLOW 05/05/2014 2014   APPEARANCEUR CLEAR 05/05/2014 2014   LABSPEC 1.017 05/05/2014 2014   PHURINE 6.0 05/05/2014 2014   GLUCOSEU NEGATIVE 05/05/2014 2014   HGBUR NEGATIVE 05/05/2014 2014   [place] NEGATIVE 05/05/2014 2014   [place] 05/05/2014 2014   PROTEINUR NEGATIVE 05/05/2014 2014   UROBILINOGEN 0.2 05/05/2014 2014   NITRITE NEGATIVE 05/05/2014 2014   LEUKOCYTESUR TRACE (A) 05/05/2014 2014   Sepsis Labs Invalid input(s): PROCALCITONIN,  WBC,   LACTICIDVEN Microbiology Recent Results (from the past 240 hour(s))  MRSA PCR Screening     Status: None   Collection Time: 03/19/18  5:06 PM  Result Value Ref Range Status   MRSA by PCR NEGATIVE NEGATIVE Final    Comment:        The GeneXpert MRSA Assay (FDA approved for NASAL specimens only), is one component of a comprehensive MRSA colonization surveillance program. It is not intended to diagnose MRSA infection nor to guide or monitor treatment for MRSA infections. Performed at [facility], [place] [address], [place], [place] [address]      Time coordinating discharge: 89 MIN SIGNED:   Georgette Shell, MD  [facility] 03/21/2018, 12:22 PM Pager   If 7PM-7AM, please contact night-coverage [email or web address] Password [email or web address]

## 2018-03-21 NOTE — Evaluation (Signed)
Physical Therapy Evaluation Patient Details Name: [name] MRN: [id number] DOB: [birth year] Today's Date: 03/21/2018   History of Present Illness  84 yo admitted with AMS after OD of pain medicine. PMHx: back pain, GERD, HTN, HLD, macular degeneration, bil TKA  Clinical Impression  Pt pleasant and willing to mobilize with limitation by pain, impaired balance and fatigue. Pt reports no falls in the last year but lack of 24hr assist at home with progressive decline in functional mobility. Pt with decreased strength and function who will benefit from acute therapy to maximize safety, gait and independence to decrease burden of care.     Follow Up Recommendations SNF;Supervision/Assistance - 24 hour    Equipment Recommendations  None recommended by PT    Recommendations for Other Services       Precautions / Restrictions Precautions Precautions: Fall Restrictions Weight Bearing Restrictions: No      Mobility  Bed Mobility Overal bed mobility: Needs Assistance Bed Mobility: Supine to Sit     Supine to sit: Min guard     General bed mobility comments: guarding for safety with increased time  Transfers Overall transfer level: Needs assistance   Transfers: Sit to/from Stand Sit to Stand: Min guard         General transfer comment: guarding for lines and safety with cues for hand placement  Ambulation/Gait Ambulation/Gait assistance: Min assist Ambulation Distance (Feet): 35 Feet Assistive device: Rolling walker (2 wheeled) Gait Pattern/deviations: Step-through pattern;Decreased stride length;Trunk flexed   Gait velocity interpretation: <1.31 ft/sec, indicative of household ambulator General Gait Details: pt with kyphotic posture with limited gait due to pain and fatigue, cues for position and safety with min assist to control balance and direct RW  Stairs            Wheelchair Mobility    Modified Rankin (Stroke Patients Only)       Balance  Overall balance assessment: Needs assistance;History of Falls   Sitting balance-[name]: Good       Standing balance-[name]: Poor                               Pertinent Vitals/Pain Pain Assessment: 0-10 Pain Score: 5  Pain Location: back Pain Descriptors / Indicators: Aching Pain Intervention(s): Limited activity within patient's tolerance;Repositioned;Monitored during session;Premedicated before session    Home Living Family/patient expects to be discharged to:: Private residence Living Arrangements: Children Available Help at Discharge: Family;Available PRN/intermittently Type of Home: House Home Access: Stairs to enter Entrance Stairs-Rails: Left Entrance Stairs-Number of Steps: 3 Home Layout: One level Home Equipment: Walker - 4 wheels;Cane - single point;Shower seat;Bedside commode Additional Comments: uses rollator at all times    Prior Function Level of Independence: Independent with assistive device(s)         Comments: pt and son split cooking, she performs bathing and dressing from seated position, have assist for cleaning, does not drive or leave home other than church and MD     Hand Dominance        Extremity/Trunk Assessment   Upper Extremity Assessment Upper Extremity Assessment: Generalized weakness    Lower Extremity Assessment Lower Extremity Assessment: Generalized weakness    Cervical / Trunk Assessment Cervical / Trunk Assessment: Kyphotic  Communication   Communication: HOH  Cognition Arousal/Alertness: Awake/alert Behavior During Therapy: WFL for tasks assessed/performed Overall Cognitive Status: Within Functional Limits for tasks assessed  General Comments      Exercises     Assessment/Plan    PT Assessment Patient needs continued PT services  PT Problem List Decreased strength;Decreased mobility;Decreased activity tolerance;Decreased  balance;Decreased knowledge of use of DME;Pain       PT Treatment Interventions Gait training;Therapeutic exercise;Patient/family education;Stair training;Balance training;Functional mobility training;DME instruction;Therapeutic activities    PT Goals (Current goals can be found in the Care Plan section)  Acute Rehab PT Goals Patient Stated Goal: return to reading and church PT Goal Formulation: With patient Time For Goal Achievement: 04/04/18 Potential to Achieve Goals: Fair    Frequency Min 3X/week   Barriers to discharge Decreased caregiver support      Co-evaluation               AM-PAC PT "6 Clicks" Daily Activity  Outcome Measure Difficulty turning over in bed (including adjusting bedclothes, sheets and blankets)?: A Little Difficulty moving from lying on back to sitting on the side of the bed? : A Little Difficulty sitting down on and standing up from a chair with arms (e.g., wheelchair, bedside commode, etc,.)?: A Little Help needed moving to and from a bed to chair (including a wheelchair)?: A Little Help needed walking in hospital room?: A Little Help needed climbing 3-5 steps with a railing? : A Lot 6 Click Score: 17    End of Session Equipment Utilized During Treatment: Gait belt Activity Tolerance: Patient tolerated treatment well Patient left: in chair;with call bell/phone within reach;with chair alarm set Nurse Communication: Mobility status PT Visit Diagnosis: Other abnormalities of gait and mobility (R26.89);Muscle weakness (generalized) (M62.81);Unsteadiness on feet (R26.81)    Time: 8768-1157 PT Time Calculation (min) (ACUTE ONLY): 19 min   Charges:   PT Evaluation $PT Eval Moderate Complexity: 1 Mod     PT G Codes:        Elwyn Reach, PT [phone]   Nathanie Ottley B Trypp Heckmann 03/21/2018, 10:00 AM

## 2018-03-21 NOTE — NC FL2 (Signed)
[place] MEDICAID FL2 LEVEL OF CARE SCREENING TOOL     IDENTIFICATION  Patient Name: NORMAN BIER Birthdate: September 17, 1926 Sex: female Admission Date (Current Location): 03/19/2018  [facility] and [place] Number:  Herbalist and Address:  [facility] [place], [place] [address] [place], [place] [address]      Provider Number: [id number]  Attending Physician Name and Address:  Georgette Shell, MD  Relative Name and Phone Number:       Current Level of Care: Hospital Recommended Level of Care: [place] Prior Approval Number:    Date Approved/Denied:   PASRR Number: [id number]  Discharge Plan: SNF    Current Diagnoses: Patient Active Problem List   Diagnosis Date Noted  . Acetaminophen overdose of undetermined intent 03/19/2018  . Chronic lower back pain 03/19/2018  . Acetaminophen overdose, intentional self-harm, initial encounter ([place]) 03/19/2018  . Constipation   . Generalized abdominal pain   . OSA (obstructive sleep apnea) 10/06/2016  . Chest pain 08/05/2015  . UTI (urinary tract infection) 05/06/2014  . Essential hypertension, benign 05/06/2014  . Humerus fracture 05/06/2014  . Dizziness 05/05/2014  . Fall 05/05/2014  . External hemorrhoid, thrombosed-left 01/24/2013  . Acute blood loss anemia 07/01/2012  . S/P left TH revision 06/28/2012  . Hyponatremia 06/11/2012  . Hip fx ([place]) 06/09/2012  . Leukocytosis 06/09/2012  . MITRAL VALVE DISORDERS 02/10/2010  . MURMUR 01/16/2010  . GERD 01/15/2010  . Osteoarthritis 01/15/2010  . BACK PAIN, CHRONIC 01/15/2010  . OSTEOPOROSIS 01/15/2010  . CHEST DISCOMFORT 01/15/2010  . GASTROINTESTINAL HEMORRHAGE, HX OF 01/15/2010    Orientation RESPIRATION BLADDER Height & Weight     Self, Time, Situation, Place  Normal Continent Weight: 150 lb (68 kg) Height:  5\' 6"  (167.6 cm)  BEHAVIORAL SYMPTOMS/MOOD NEUROLOGICAL BOWEL NUTRITION STATUS      Continent Diet  AMBULATORY  STATUS COMMUNICATION OF NEEDS Skin   Limited Assist Verbally Normal                       Personal Care Assistance Level of Assistance  Bathing, Dressing Bathing Assistance: Limited assistance   Dressing Assistance: Limited assistance     Functional Limitations Info             SPECIAL CARE FACTORS FREQUENCY  PT (By licensed PT), OT (By licensed OT)     PT Frequency: 5/wk OT Frequency: 5/wk            Contractures      Additional Factors Info  Code Status, Allergies Code Status Info: DNR Allergies Info: Aspirin, Boniva Ibandronic Acid, Morphine, Nitrofuran Derivatives, Penicillins, Sulfonamide Derivatives           Current Medications (03/21/2018):  This is the current hospital active medication list Current Facility-Administered Medications  Medication Dose Route Frequency Provider Last Rate Last Dose  . 0.9 %  sodium chloride infusion   Intravenous Continuous Georgette Shell, MD 75 mL/hr at 03/21/18 [phone]    . bisacodyl (DULCOLAX) EC tablet 10 mg  10 mg Oral Daily Georgette Shell, MD      . enoxaparin (LOVENOX) injection 30 mg  30 mg Subcutaneous Q24H Bonnell Public Tublu, MD   30 mg at 03/20/18 1600  . gabapentin (NEURONTIN) capsule 100 mg  100 mg Oral QHS Bonnell Public Tublu, MD   100 mg at 03/20/18 2158  . ondansetron (ZOFRAN) injection 4 mg  4 mg Intravenous Q4H PRN Vashti Hey, MD  4 mg at 03/19/18 2145  . oxyCODONE (Oxy IR/ROXICODONE) immediate release tablet 5 mg  5 mg Oral Q6H PRN Bonnell Public Tublu, MD      . pantoprazole (PROTONIX) EC tablet 40 mg  40 mg Oral Daily Bonnell Public Tublu, MD   40 mg at 03/21/18 0942  . sodium chloride 0.9 % bolus 2,000 mL  2,000 mL Intravenous Once Bonnell Public Tublu, MD      . trimethoprim (TRIMPEX) tablet 100 mg  100 mg Oral Daily Bonnell Public Tublu, MD   100 mg at 03/21/18 9373     Discharge Medications: Please see discharge summary for a list of  discharge medications.  Relevant Imaging Results:  Relevant Lab Results:   Additional Information SS# [id number]  Jorge Ny, LCSW

## 2018-03-22 DIAGNOSIS — M545 Low back pain: Secondary | ICD-10-CM | POA: Diagnosis not present

## 2018-03-22 DIAGNOSIS — M6281 Muscle weakness (generalized): Secondary | ICD-10-CM | POA: Diagnosis not present

## 2018-03-22 DIAGNOSIS — H3530 Unspecified macular degeneration: Secondary | ICD-10-CM | POA: Diagnosis not present

## 2018-03-22 DIAGNOSIS — G4733 Obstructive sleep apnea (adult) (pediatric): Secondary | ICD-10-CM | POA: Diagnosis not present

## 2018-03-22 DIAGNOSIS — R41841 Cognitive communication deficit: Secondary | ICD-10-CM | POA: Diagnosis not present

## 2018-03-22 DIAGNOSIS — M455 Ankylosing spondylitis of thoracolumbar region: Secondary | ICD-10-CM | POA: Diagnosis not present

## 2018-03-22 DIAGNOSIS — R1084 Generalized abdominal pain: Secondary | ICD-10-CM | POA: Diagnosis not present

## 2018-03-22 DIAGNOSIS — I10 Essential (primary) hypertension: Secondary | ICD-10-CM | POA: Diagnosis not present

## 2018-03-22 DIAGNOSIS — G8929 Other chronic pain: Secondary | ICD-10-CM | POA: Diagnosis not present

## 2018-03-22 DIAGNOSIS — M810 Age-related osteoporosis without current pathological fracture: Secondary | ICD-10-CM | POA: Diagnosis not present

## 2018-03-22 DIAGNOSIS — H353231 Exudative age-related macular degeneration, bilateral, with active choroidal neovascularization: Secondary | ICD-10-CM | POA: Diagnosis not present

## 2018-03-22 DIAGNOSIS — T391X4D Poisoning by 4-Aminophenol derivatives, undetermined, subsequent encounter: Secondary | ICD-10-CM | POA: Diagnosis not present

## 2018-03-22 DIAGNOSIS — R278 Other lack of coordination: Secondary | ICD-10-CM | POA: Diagnosis not present

## 2018-03-22 DIAGNOSIS — K5900 Constipation, unspecified: Secondary | ICD-10-CM | POA: Diagnosis not present

## 2018-03-22 DIAGNOSIS — N3020 Other chronic cystitis without hematuria: Secondary | ICD-10-CM | POA: Diagnosis not present

## 2018-03-22 DIAGNOSIS — H353211 Exudative age-related macular degeneration, right eye, with active choroidal neovascularization: Secondary | ICD-10-CM | POA: Diagnosis not present

## 2018-03-22 LAB — COMPREHENSIVE METABOLIC PANEL
ALBUMIN: 2.8 g/dL — AB (ref 3.5–5.0)
ALK PHOS: 70 U/L (ref 38–126)
ALT: 13 U/L — AB (ref 14–54)
ANION GAP: 5 (ref 5–15)
AST: 21 U/L (ref 15–41)
BILIRUBIN TOTAL: 0.4 mg/dL (ref 0.3–1.2)
BUN: 9 mg/dL (ref 6–20)
CALCIUM: 8.3 mg/dL — AB (ref 8.9–10.3)
CO2: 22 mmol/L (ref 22–32)
Chloride: 106 mmol/L (ref 101–111)
Creatinine, Ser: 0.61 mg/dL (ref 0.44–1.00)
GFR calc Af Amer: >60 mL/min (ref >60)
GFR calc non Af Amer: >60 mL/min (ref >60)
GLUCOSE: 87 mg/dL (ref 65–99)
Potassium: 3.9 mmol/L (ref 3.5–5.1)
SODIUM: 133 mmol/L — AB (ref 135–145)
TOTAL PROTEIN: 5.2 g/dL — AB (ref 6.5–8.1)

## 2018-03-22 LAB — PROTIME-INR
INR: 1.06
Prothrombin Time: 13.7 seconds (ref 11.4–15.2)

## 2018-03-22 MED ORDER — GUAIFENESIN-DM 100-10 MG/5ML PO SYRP
5.0000 mL | ORAL_SOLUTION | ORAL | Status: DC | PRN
  Administered 2018-03-22: 5 mL via ORAL
  Filled 2018-03-22: qty 5

## 2018-03-22 NOTE — Progress Notes (Signed)
Discharged to [facility] by [facility] ambulance, discharge papers given to ambulance staff.

## 2018-03-22 NOTE — Clinical Social Work Placement (Signed)
   CLINICAL SOCIAL WORK PLACEMENT  NOTE  Date:  03/22/2018  Patient Details  Name: [name] MRN: [id number] Date of Birth: 1926/04/16  Clinical Social Work is seeking post-discharge placement for this patient at the [place] level of care (*CSW will initial, date and re-position this form in  chart as items are completed):  Yes   Patient/family provided with [facility] Work Department's list of facilities offering this level of care within the geographic area requested by the patient (or if unable, by the patient's family).  Yes   Patient/family informed of their freedom to choose among providers that offer the needed level of care, that participate in Medicare, Medicaid or managed care program needed by the patient, have an available bed and are willing to accept the patient.  Yes   Patient/family informed of [facility]'s ownership interest in [facility] and [facility], as well as of the fact that they are under no obligation to receive care at these facilities.  PASRR submitted to EDS on 03/21/18     PASRR number received on 03/21/18     Existing PASRR number confirmed on       FL2 transmitted to all facilities in geographic area requested by pt/family on 03/21/18     FL2 transmitted to all facilities within larger geographic area on       Patient informed that his/her managed care company has contracts with or will negotiate with certain facilities, including the following:        Yes   Patient/family informed of bed offers received.  Patient chooses bed at [facility]     Physician recommends and patient chooses bed at      Patient to be transferred to [facility] on 03/22/18.  Patient to be transferred to facility by ptar     Patient family notified on 03/22/18 of transfer.  Name of family member notified:  Rozann Lesches     PHYSICIAN Please sign FL2, Please sign DNR     Additional  Comment:    _______________________________________________ Jorge Ny, LCSW 03/22/2018, 10:37 AM

## 2018-03-22 NOTE — Consult Note (Signed)
            [facility] CM Primary Care Navigator  03/22/2018  [name] May 30, 1926 [id number]   Went to see patient at the bedside to identify possible discharge needs but she was alreadydischarged per staff report. Per chart review, patient was brought in by her family for altered mental status (acetaminophen overdose of undetermined intent).  Patient was discharged to skilled nursing facility (SNF) for rehabilitationper therapy recommendation.  Primary care provider's office is listed as providing transition of care (TOC) follow-up.  Patient has a discharge instruction to follow-up with primary care provider in 1- 2 weeks post discharge.   For additional questions please contact:  Edwena Felty A. Jammi Morrissette, BSN, RN-BC [facility] PRIMARY CARE Navigator Cell: [phone]

## 2018-03-23 DIAGNOSIS — G8929 Other chronic pain: Secondary | ICD-10-CM | POA: Diagnosis not present

## 2018-03-23 DIAGNOSIS — G4733 Obstructive sleep apnea (adult) (pediatric): Secondary | ICD-10-CM | POA: Diagnosis not present

## 2018-03-23 DIAGNOSIS — T391X4D Poisoning by 4-Aminophenol derivatives, undetermined, subsequent encounter: Secondary | ICD-10-CM | POA: Diagnosis not present

## 2018-03-29 DIAGNOSIS — N3020 Other chronic cystitis without hematuria: Secondary | ICD-10-CM | POA: Diagnosis not present

## 2018-03-29 DIAGNOSIS — I10 Essential (primary) hypertension: Secondary | ICD-10-CM | POA: Diagnosis not present

## 2018-03-29 DIAGNOSIS — M545 Low back pain: Secondary | ICD-10-CM | POA: Diagnosis not present

## 2018-03-29 DIAGNOSIS — T391X4D Poisoning by 4-Aminophenol derivatives, undetermined, subsequent encounter: Secondary | ICD-10-CM | POA: Diagnosis not present

## 2018-03-31 DIAGNOSIS — H353211 Exudative age-related macular degeneration, right eye, with active choroidal neovascularization: Secondary | ICD-10-CM | POA: Diagnosis not present

## 2018-03-31 DIAGNOSIS — H353231 Exudative age-related macular degeneration, bilateral, with active choroidal neovascularization: Secondary | ICD-10-CM | POA: Diagnosis not present

## 2018-04-05 DIAGNOSIS — M545 Low back pain: Secondary | ICD-10-CM | POA: Diagnosis not present

## 2018-04-05 DIAGNOSIS — T391X4D Poisoning by 4-Aminophenol derivatives, undetermined, subsequent encounter: Secondary | ICD-10-CM | POA: Diagnosis not present

## 2018-04-05 DIAGNOSIS — N3020 Other chronic cystitis without hematuria: Secondary | ICD-10-CM | POA: Diagnosis not present

## 2018-04-05 DIAGNOSIS — I10 Essential (primary) hypertension: Secondary | ICD-10-CM | POA: Diagnosis not present

## 2018-04-13 DIAGNOSIS — I10 Essential (primary) hypertension: Secondary | ICD-10-CM | POA: Diagnosis not present

## 2018-04-13 DIAGNOSIS — H3530 Unspecified macular degeneration: Secondary | ICD-10-CM | POA: Diagnosis not present

## 2018-04-13 DIAGNOSIS — T391X4D Poisoning by 4-Aminophenol derivatives, undetermined, subsequent encounter: Secondary | ICD-10-CM | POA: Diagnosis not present

## 2018-04-13 DIAGNOSIS — M545 Low back pain: Secondary | ICD-10-CM | POA: Diagnosis not present

## 2018-04-20 DIAGNOSIS — N3020 Other chronic cystitis without hematuria: Secondary | ICD-10-CM | POA: Diagnosis not present

## 2018-04-20 DIAGNOSIS — T391X4D Poisoning by 4-Aminophenol derivatives, undetermined, subsequent encounter: Secondary | ICD-10-CM | POA: Diagnosis not present

## 2018-04-20 DIAGNOSIS — I10 Essential (primary) hypertension: Secondary | ICD-10-CM | POA: Diagnosis not present

## 2018-04-20 DIAGNOSIS — M545 Low back pain: Secondary | ICD-10-CM | POA: Diagnosis not present

## 2018-04-29 DIAGNOSIS — K219 Gastro-esophageal reflux disease without esophagitis: Secondary | ICD-10-CM | POA: Diagnosis not present

## 2018-04-29 DIAGNOSIS — I10 Essential (primary) hypertension: Secondary | ICD-10-CM | POA: Diagnosis not present

## 2018-04-29 DIAGNOSIS — M545 Low back pain: Secondary | ICD-10-CM | POA: Diagnosis not present

## 2018-04-29 DIAGNOSIS — N3020 Other chronic cystitis without hematuria: Secondary | ICD-10-CM | POA: Diagnosis not present

## 2018-05-18 DIAGNOSIS — I10 Essential (primary) hypertension: Secondary | ICD-10-CM | POA: Diagnosis not present

## 2018-05-18 DIAGNOSIS — G4730 Sleep apnea, unspecified: Secondary | ICD-10-CM | POA: Diagnosis not present

## 2018-05-18 DIAGNOSIS — Z66 Do not resuscitate: Secondary | ICD-10-CM | POA: Diagnosis not present

## 2018-05-19 DIAGNOSIS — H353223 Exudative age-related macular degeneration, left eye, with inactive scar: Secondary | ICD-10-CM | POA: Diagnosis not present

## 2018-05-19 DIAGNOSIS — H35721 Serous detachment of retinal pigment epithelium, right eye: Secondary | ICD-10-CM | POA: Diagnosis not present

## 2018-05-19 DIAGNOSIS — H353211 Exudative age-related macular degeneration, right eye, with active choroidal neovascularization: Secondary | ICD-10-CM | POA: Diagnosis not present

## 2018-05-27 DIAGNOSIS — M25531 Pain in right wrist: Secondary | ICD-10-CM | POA: Diagnosis not present

## 2018-05-27 DIAGNOSIS — K219 Gastro-esophageal reflux disease without esophagitis: Secondary | ICD-10-CM | POA: Diagnosis not present

## 2018-05-27 DIAGNOSIS — I10 Essential (primary) hypertension: Secondary | ICD-10-CM | POA: Diagnosis not present

## 2018-05-27 DIAGNOSIS — M810 Age-related osteoporosis without current pathological fracture: Secondary | ICD-10-CM | POA: Diagnosis not present

## 2018-05-30 DIAGNOSIS — M810 Age-related osteoporosis without current pathological fracture: Secondary | ICD-10-CM | POA: Diagnosis not present

## 2018-05-30 DIAGNOSIS — M25531 Pain in right wrist: Secondary | ICD-10-CM | POA: Diagnosis not present

## 2018-05-30 DIAGNOSIS — H3530 Unspecified macular degeneration: Secondary | ICD-10-CM | POA: Diagnosis not present

## 2018-05-30 DIAGNOSIS — I10 Essential (primary) hypertension: Secondary | ICD-10-CM | POA: Diagnosis not present

## 2018-06-02 ENCOUNTER — Inpatient Hospital Stay (HOSPITAL_COMMUNITY)
Admission: EM | Admit: 2018-06-02 | Discharge: 2018-06-24 | DRG: 854 | Disposition: A | Discharge status: Skilled Nursing Facility | Payer: Medicare Other | Attending: Internal Medicine | Admitting: Internal Medicine

## 2018-06-02 ENCOUNTER — Encounter (HOSPITAL_COMMUNITY): Payer: Self-pay

## 2018-06-02 ENCOUNTER — Emergency Department (HOSPITAL_COMMUNITY): Payer: Medicare Other

## 2018-06-02 DIAGNOSIS — R0789 Other chest pain: Secondary | ICD-10-CM | POA: Diagnosis not present

## 2018-06-02 DIAGNOSIS — I248 Other forms of acute ischemic heart disease: Secondary | ICD-10-CM | POA: Diagnosis present

## 2018-06-02 DIAGNOSIS — R079 Chest pain, unspecified: Secondary | ICD-10-CM | POA: Diagnosis not present

## 2018-06-02 DIAGNOSIS — H3530 Unspecified macular degeneration: Secondary | ICD-10-CM | POA: Diagnosis not present

## 2018-06-02 DIAGNOSIS — Z96653 Presence of artificial knee joint, bilateral: Secondary | ICD-10-CM | POA: Diagnosis present

## 2018-06-02 DIAGNOSIS — I5032 Chronic diastolic (congestive) heart failure: Secondary | ICD-10-CM | POA: Diagnosis not present

## 2018-06-02 DIAGNOSIS — R000 Tachycardia, unspecified: Secondary | ICD-10-CM | POA: Diagnosis not present

## 2018-06-02 DIAGNOSIS — E860 Dehydration: Secondary | ICD-10-CM | POA: Diagnosis present

## 2018-06-02 DIAGNOSIS — R6510 Systemic inflammatory response syndrome (SIRS) of non-infectious origin without acute organ dysfunction: Secondary | ICD-10-CM | POA: Diagnosis present

## 2018-06-02 DIAGNOSIS — K5909 Other constipation: Secondary | ICD-10-CM | POA: Diagnosis present

## 2018-06-02 DIAGNOSIS — I10 Essential (primary) hypertension: Secondary | ICD-10-CM | POA: Diagnosis not present

## 2018-06-02 DIAGNOSIS — R110 Nausea: Secondary | ICD-10-CM | POA: Diagnosis not present

## 2018-06-02 DIAGNOSIS — D638 Anemia in other chronic diseases classified elsewhere: Secondary | ICD-10-CM | POA: Diagnosis present

## 2018-06-02 DIAGNOSIS — I110 Hypertensive heart disease with heart failure: Secondary | ICD-10-CM | POA: Diagnosis present

## 2018-06-02 DIAGNOSIS — N390 Urinary tract infection, site not specified: Secondary | ICD-10-CM | POA: Diagnosis not present

## 2018-06-02 DIAGNOSIS — A4151 Sepsis due to Escherichia coli [E. coli]: Principal | ICD-10-CM | POA: Diagnosis present

## 2018-06-02 DIAGNOSIS — R338 Other retention of urine: Secondary | ICD-10-CM

## 2018-06-02 DIAGNOSIS — G4489 Other headache syndrome: Secondary | ICD-10-CM | POA: Diagnosis not present

## 2018-06-02 DIAGNOSIS — Z79891 Long term (current) use of opiate analgesic: Secondary | ICD-10-CM

## 2018-06-02 DIAGNOSIS — Z993 Dependence on wheelchair: Secondary | ICD-10-CM

## 2018-06-02 DIAGNOSIS — K567 Ileus, unspecified: Secondary | ICD-10-CM

## 2018-06-02 DIAGNOSIS — E785 Hyperlipidemia, unspecified: Secondary | ICD-10-CM | POA: Diagnosis present

## 2018-06-02 DIAGNOSIS — R109 Unspecified abdominal pain: Secondary | ICD-10-CM | POA: Diagnosis present

## 2018-06-02 DIAGNOSIS — K219 Gastro-esophageal reflux disease without esophagitis: Secondary | ICD-10-CM | POA: Diagnosis not present

## 2018-06-02 DIAGNOSIS — R05 Cough: Secondary | ICD-10-CM | POA: Diagnosis not present

## 2018-06-02 DIAGNOSIS — Z96611 Presence of right artificial shoulder joint: Secondary | ICD-10-CM | POA: Diagnosis present

## 2018-06-02 DIAGNOSIS — I700 Atherosclerosis of aorta: Secondary | ICD-10-CM | POA: Diagnosis present

## 2018-06-02 DIAGNOSIS — K9189 Other postprocedural complications and disorders of digestive system: Secondary | ICD-10-CM

## 2018-06-02 DIAGNOSIS — Z886 Allergy status to analgesic agent status: Secondary | ICD-10-CM

## 2018-06-02 DIAGNOSIS — K449 Diaphragmatic hernia without obstruction or gangrene: Secondary | ICD-10-CM | POA: Diagnosis not present

## 2018-06-02 DIAGNOSIS — R112 Nausea with vomiting, unspecified: Secondary | ICD-10-CM | POA: Diagnosis not present

## 2018-06-02 DIAGNOSIS — R7989 Other specified abnormal findings of blood chemistry: Secondary | ICD-10-CM | POA: Diagnosis present

## 2018-06-02 DIAGNOSIS — R1110 Vomiting, unspecified: Secondary | ICD-10-CM

## 2018-06-02 DIAGNOSIS — I472 Ventricular tachycardia: Secondary | ICD-10-CM | POA: Diagnosis present

## 2018-06-02 DIAGNOSIS — Z79899 Other long term (current) drug therapy: Secondary | ICD-10-CM

## 2018-06-02 DIAGNOSIS — Z87891 Personal history of nicotine dependence: Secondary | ICD-10-CM

## 2018-06-02 DIAGNOSIS — K429 Umbilical hernia without obstruction or gangrene: Secondary | ICD-10-CM | POA: Diagnosis present

## 2018-06-02 DIAGNOSIS — Z931 Gastrostomy status: Secondary | ICD-10-CM

## 2018-06-02 DIAGNOSIS — R1013 Epigastric pain: Secondary | ICD-10-CM

## 2018-06-02 DIAGNOSIS — E440 Moderate protein-calorie malnutrition: Secondary | ICD-10-CM | POA: Diagnosis not present

## 2018-06-02 DIAGNOSIS — D72829 Elevated white blood cell count, unspecified: Secondary | ICD-10-CM | POA: Diagnosis present

## 2018-06-02 DIAGNOSIS — Z888 Allergy status to other drugs, medicaments and biological substances status: Secondary | ICD-10-CM

## 2018-06-02 DIAGNOSIS — I272 Pulmonary hypertension, unspecified: Secondary | ICD-10-CM | POA: Diagnosis present

## 2018-06-02 DIAGNOSIS — Z9889 Other specified postprocedural states: Secondary | ICD-10-CM

## 2018-06-02 DIAGNOSIS — Z0189 Encounter for other specified special examinations: Secondary | ICD-10-CM

## 2018-06-02 DIAGNOSIS — Z880 Allergy status to penicillin: Secondary | ICD-10-CM

## 2018-06-02 DIAGNOSIS — Z66 Do not resuscitate: Secondary | ICD-10-CM | POA: Diagnosis present

## 2018-06-02 DIAGNOSIS — K440 Diaphragmatic hernia with obstruction, without gangrene: Secondary | ICD-10-CM | POA: Insufficient documentation

## 2018-06-02 DIAGNOSIS — E876 Hypokalemia: Secondary | ICD-10-CM | POA: Diagnosis present

## 2018-06-02 HISTORY — DX: Chronic diastolic (congestive) heart failure: I50.32

## 2018-06-02 LAB — HEPATIC FUNCTION PANEL
ALK PHOS: 78 U/L (ref 38–126)
ALT: 14 U/L (ref 0–44)
AST: 23 U/L (ref 15–41)
Albumin: 3.6 g/dL (ref 3.5–5.0)
BILIRUBIN TOTAL: 0.7 mg/dL (ref 0.3–1.2)
Bilirubin, Direct: <0.1 mg/dL (ref 0.0–0.2)
Total Protein: 7 g/dL (ref 6.5–8.1)

## 2018-06-02 LAB — CBC
HEMATOCRIT: 36.4 % (ref 36.0–46.0)
Hemoglobin: 12 g/dL (ref 12.0–15.0)
MCH: 29.7 pg (ref 26.0–34.0)
MCHC: 33 g/dL (ref 30.0–36.0)
MCV: 90.1 fL (ref 78.0–100.0)
Platelets: 432 10*3/uL — ABNORMAL HIGH (ref 150–400)
RBC: 4.04 MIL/uL (ref 3.87–5.11)
RDW: 12.5 % (ref 11.5–15.5)
WBC: 16.5 10*3/uL — AB (ref 4.0–10.5)

## 2018-06-02 LAB — BASIC METABOLIC PANEL
Anion gap: 14 (ref 5–15)
BUN: 17 mg/dL (ref 8–23)
CO2: 24 mmol/L (ref 22–32)
Calcium: 9.2 mg/dL (ref 8.9–10.3)
Chloride: 94 mmol/L — ABNORMAL LOW (ref 98–111)
Creatinine, Ser: 0.8 mg/dL (ref 0.44–1.00)
GFR calc Af Amer: >60 mL/min (ref >60)
GFR calc non Af Amer: >60 mL/min (ref >60)
Glucose, Bld: 162 mg/dL — ABNORMAL HIGH (ref 70–99)
POTASSIUM: 4.3 mmol/L (ref 3.5–5.1)
Sodium: 132 mmol/L — ABNORMAL LOW (ref 135–145)

## 2018-06-02 LAB — LIPASE, BLOOD: LIPASE: 22 U/L (ref 11–51)

## 2018-06-02 LAB — I-STAT TROPONIN, ED: TROPONIN I, POC: 0.06 ng/mL (ref 0.00–0.08)

## 2018-06-02 LAB — I-STAT CG4 LACTIC ACID, ED: Lactic Acid, Venous: 2.51 mmol/L (ref 0.5–1.9)

## 2018-06-02 MED ORDER — DIPHENHYDRAMINE HCL 50 MG/ML IJ SOLN
12.5000 mg | Freq: Once | INTRAMUSCULAR | Status: AC
  Administered 2018-06-02: 12.5 mg via INTRAVENOUS
  Filled 2018-06-02: qty 1

## 2018-06-02 MED ORDER — LACTATED RINGERS IV BOLUS
1000.0000 mL | Freq: Once | INTRAVENOUS | Status: AC
  Administered 2018-06-02: 1000 mL via INTRAVENOUS

## 2018-06-02 MED ORDER — IOHEXOL 300 MG/ML  SOLN
100.0000 mL | Freq: Once | INTRAMUSCULAR | Status: AC | PRN
  Administered 2018-06-02: 100 mL via INTRAVENOUS

## 2018-06-02 MED ORDER — DIPHENHYDRAMINE HCL 25 MG PO CAPS: 25.0000 mg | ORAL_CAPSULE | Freq: Once | ORAL | Status: DC

## 2018-06-02 MED ORDER — METOCLOPRAMIDE HCL 5 MG/ML IJ SOLN
5.0000 mg | INTRAMUSCULAR | Status: AC
  Administered 2018-06-02: 5 mg via INTRAVENOUS
  Filled 2018-06-02: qty 2

## 2018-06-02 MED ORDER — PROCHLORPERAZINE EDISYLATE 10 MG/2ML IJ SOLN: 5.0000 mg | Freq: Once | INTRAMUSCULAR | Status: DC

## 2018-06-02 MED ORDER — PROCHLORPERAZINE EDISYLATE 10 MG/2ML IJ SOLN
2.5000 mg | Freq: Once | INTRAMUSCULAR | Status: AC
  Administered 2018-06-02: 2.5 mg via INTRAVENOUS
  Filled 2018-06-02: qty 2

## 2018-06-02 NOTE — ED Provider Notes (Signed)
[facility] EMERGENCY DEPARTMENT Provider Note   CSN: [id number] Arrival date & time: 06/02/18  2045   History   Chief Complaint Chief Complaint  Patient presents with  . Chest Pain  . Abdominal Pain    HPI [name] is a 84 y.o. female.  HPI  Patient is a 84 year old female with history of hiatal hernia and GERD presenting to the ED for vomiting, epigastric pain, and chest pain. Per patient and daughter, she had onset of vomiting and epigastric pain this morning which has persisted throughout the day. She also had an episode of dull chest pain rating to the neck earlier this morning after trying to eat. No significant dyspnea. No recent  Fever, diarrhea, or dysuria. Symptoms similar to prior episode in which she was admitted for hiatal hernia and symptom control per daughter. She has been unable to tolerate significant PO intake today. She had been in her usual state of health until this morning. No current chest pain. No other complaints.  Past Medical History:  Diagnosis Date  . Actinic keratosis   . Allergic rhinitis   . Back pain   . Chronic constipation   . Chronic diastolic CHF (congestive heart failure) ([place]) 06/03/2018  . GERD (gastroesophageal reflux disease)   . Hyperlipidemia   . Hypertension   . Hyponatremia   . Leg edema   . Macular degeneration   . Mitral regurgitation    mild by echo 2003  . Osteoarthritis   . Osteoarthrosis   . Osteoporosis   . Tenosynovitis    myxomatous    Patient Active Problem List   Diagnosis Date Noted  . Abdominal pain 06/03/2018  . Hiatal hernia 06/03/2018  . GERD (gastroesophageal reflux disease) 06/03/2018  . Elevated lactic acid level 06/03/2018  . Chronic diastolic CHF (congestive heart failure) ([place]) 06/03/2018  . SIRS (systemic inflammatory response syndrome) ([place]) 06/03/2018  . Acetaminophen overdose of undetermined intent 03/19/2018  . Chronic lower back pain 03/19/2018  . Acetaminophen  overdose, intentional self-harm, initial encounter ([place]) 03/19/2018  . Constipation   . Generalized abdominal pain   . OSA (obstructive sleep apnea) 10/06/2016  . Chest pain 08/05/2015  . UTI (urinary tract infection) 05/06/2014  . Essential hypertension, benign 05/06/2014  . Humerus fracture 05/06/2014  . Dizziness 05/05/2014  . Fall 05/05/2014  . External hemorrhoid, thrombosed-left 01/24/2013  . Acute blood loss anemia 07/01/2012  . S/P left TH revision 06/28/2012  . Hyponatremia 06/11/2012  . Hip fx ([place]) 06/09/2012  . Leukocytosis 06/09/2012  . MITRAL VALVE DISORDERS 02/10/2010  . MURMUR 01/16/2010  . GERD 01/15/2010  . Osteoarthritis 01/15/2010  . BACK PAIN, CHRONIC 01/15/2010  . OSTEOPOROSIS 01/15/2010  . CHEST DISCOMFORT 01/15/2010  . GASTROINTESTINAL HEMORRHAGE, HX OF 01/15/2010    Past Surgical History:  Procedure Laterality Date  . CARPAL TUNNEL RELEASE     bilateral  . hernial repain    . INGUINAL HERNIA REPAIR     laparoscopic preperitoneal repair of bilateral inguinal hernias  . REPLACEMENT TOTAL KNEE BILATERAL    . REVERSE SHOULDER ARTHROPLASTY Right 05/08/2014   Procedure: REVERSE SHOULDER ARTHROPLASTY RIGHT ;  Surgeon: Marin Shutter, MD;  Location: [place];  Service: Orthopedics;  Laterality: Right;  . SYNOVECTOMY     of right long finger  . tonisellectomy    . tonisillectomy    . TONSILLECTOMY       OB History   None      Home Medications  Prior to Admission medications   Medication Sig Start Date End Date Taking? Authorizing Provider  Calcium Carbonate-Vitamin D3 (CALCIUM 600+D3) 600-400 MG-UNIT TABS Take 1 tablet by mouth daily.   Yes [provider]  cetirizine (ZYRTEC) 10 MG tablet Take 10 mg by mouth daily.    Yes [provider]  docusate sodium (COLACE) 100 MG capsule Take 100 mg by mouth 2 (two) times daily.   Yes [provider]  estradiol (ESTRACE) 0.1 MG/GM vaginal cream Place 1 Applicatorful vaginally 2  (two) times a week. Mondays and Thursdays   Yes [provider]  gabapentin (NEURONTIN) 100 MG capsule Take 100 mg by mouth at bedtime.   Yes [provider]  irbesartan (AVAPRO) 75 MG tablet Take 1 tablet (75 mg total) by mouth daily. 05/10/14  Yes Tat, Shanon Brow, MD  Multiple Vitamins-Minerals (CENTRUM SILVER ULTRA WOMENS PO) Take 1 tablet by mouth daily.   Yes [provider]  Multiple Vitamins-Minerals (PRESERVISION AREDS 2 PO) Take 2 tablets by mouth daily.    Yes [provider]  omeprazole (PRILOSEC) 20 MG capsule Take 20 mg by mouth daily.   Yes [provider]  ondansetron (ZOFRAN) 4 MG tablet Take 4 mg by mouth every 6 (six) hours as needed for nausea or vomiting.   Yes [provider]  oxyCODONE (OXY IR/ROXICODONE) 5 MG immediate release tablet Take 5 mg by mouth every 8 (eight) hours as needed for severe pain.   Yes [provider]  Probiotic Product (PROBIOTIC DAILY) CAPS Take 250 mg by mouth daily.    Yes [provider]  spironolactone (ALDACTONE) 25 MG tablet Take 25 mg by mouth daily.    Yes [provider]  trimethoprim (TRIMPEX) 100 MG tablet Take 100 mg by mouth daily. UTI prophylaxis.   Yes [provider]  pseudoephedrine-guaifenesin (MUCINEX D) 60-600 MG 12 hr tablet Take 1 tablet by mouth every 12 (twelve) hours. 06/02/18 06/05/18  [provider]    Family History Family History  Problem Relation Age of Onset  . Heart attack Father   . Heart attack Brother     Social History Social History   Tobacco Use  . Smoking status: Former Smoker    Packs/day: 1.00    Years: 2.00    Pack years: 2.00    Types: Cigarettes    Last attempt to quit: 12/07/1967    Years since quitting: 50.5  . Smokeless tobacco: Never Used  Substance Use Topics  . Alcohol use: Yes    Alcohol/week: 1.2 oz    Types: 2 Glasses of wine per week    Comment: 2 glasses per week  . Drug use: No      Allergies   Aspirin; Boniva [ibandronic acid]; Morphine; Nitrofuran derivatives; Penicillins; and Sulfonamide derivatives   Review of Systems Review of Systems  Constitutional: Negative for fever.  HENT: Negative for congestion and sore throat.   Eyes: Negative for visual disturbance.  Respiratory: Negative for shortness of breath.   Cardiovascular: Positive for chest pain.  Gastrointestinal: Positive for abdominal pain. Negative for diarrhea and vomiting.  Genitourinary: Negative for dysuria.  Musculoskeletal: Negative for neck pain.  Neurological: Negative for syncope.  All other systems reviewed and are negative.    Physical Exam Updated Vital Signs BP (!) 156/87   Pulse 99   Temp 98.3 F (36.8 C) (Oral)   Resp 19   Ht 5\' 1"  (1.549 m)   Wt 70.3 kg (155 lb)  SpO2 94%   BMI 29.29 kg/m   Physical Exam  Constitutional: She is oriented to person, place, and time. No distress.  Appears elderly  HENT:  Head: Normocephalic and atraumatic.  Mouth/Throat: Mucous membranes are dry.  Eyes: Pupils are equal, round, and reactive to light. Conjunctivae are normal.  Neck: Neck supple. No tracheal deviation present.  Cardiovascular: Regular rhythm, normal heart sounds and intact distal pulses.  No murmur heard. Mildly tachycardic  Pulmonary/Chest: Effort normal and breath sounds normal. No stridor. No respiratory distress. She has no wheezes. She has no rales.  Abdominal: Soft. She exhibits no distension and no mass. There is tenderness (mild, epigastric). There is no guarding.  Musculoskeletal: She exhibits no edema or deformity.  Neurological: She is alert and oriented to person, place, and time.  Skin: Skin is warm and dry.  Psychiatric: She has a normal mood and affect. Her behavior is normal.  Nursing note and vitals reviewed.    ED Treatments / Results  Labs (all labs ordered are listed, but only abnormal results are displayed) Labs Reviewed  BASIC METABOLIC  PANEL - Abnormal; Notable for the following components:      Result Value   Sodium 132 (*)    Chloride 94 (*)    Glucose, Bld 162 (*)    All other components within normal limits  CBC - Abnormal; Notable for the following components:   WBC 16.5 (*)    Platelets 432 (*)    All other components within normal limits  URINALYSIS, ROUTINE W REFLEX MICROSCOPIC - Abnormal; Notable for the following components:   Ketones, ur 20 (*)    Leukocytes, UA TRACE (*)    All other components within normal limits  I-STAT CG4 LACTIC ACID, ED - Abnormal; Notable for the following components:   Lactic Acid, Venous 2.51 (*)    All other components within normal limits  CULTURE, BLOOD (ROUTINE X 2)  CULTURE, BLOOD (ROUTINE X 2)  LIPASE, BLOOD  HEPATIC FUNCTION PANEL  BRAIN NATRIURETIC PEPTIDE  TROPONIN I  TROPONIN I  TROPONIN I  BASIC METABOLIC PANEL  CBC  LACTIC ACID, PLASMA  LACTIC ACID, PLASMA  PROCALCITONIN  I-STAT TROPONIN, ED  I-STAT CG4 LACTIC ACID, ED    EKG EKG Interpretation  Date/Time:  Thursday June 02 2018 20:53:14 EDT Ventricular Rate:  109 PR Interval:    QRS Duration: 88 QT Interval:  345 QTC Calculation: 465 R Axis:   -55 Text Interpretation:  Sinus tachycardia Atrial premature complex Probable left atrial enlargement Left anterior fascicular block Low voltage, precordial leads Abnormal R-wave progression, early transition Consider anterior infarct since previous tracing, rate faster Confirmed by Theotis Burrow [id number]) on 06/02/2018 9:09:51 PM   Radiology Dg Chest 2 View  Result Date: 06/02/2018 CLINICAL DATA:  Chest pain and vomiting. EXAM: CHEST - 2 VIEW COMPARISON:  03/19/2018 and chest CT 08/05/2015 FINDINGS: Lungs are somewhat hypoinflated demonstrate mild opacification over the left base which may be due to small amount of pleural fluid/atelectasis. Mild prominence of the perihilar markings suggesting mild degree of vascular congestion. Mild stable cardiomegaly. Stable  elevation of the left hemidiaphragm with large hiatal hernia unchanged. Remainder of the exam is unchanged. IMPRESSION: Mild cardiomegaly with suggestion of mild vascular congestion. Mild left base opacification likely small effusion with atelectasis. Large hiatal hernia unchanged. Electronically Signed   By: Marin Olp M.D.   On: 06/02/2018 21:42   Ct Abdomen Pelvis W Contrast  Result Date: 06/02/2018 CLINICAL DATA:  84 year old female  with acute abdominal pain and vomiting today. EXAM: CT ABDOMEN AND PELVIS WITH CONTRAST TECHNIQUE: Multidetector CT imaging of the abdomen and pelvis was performed using the standard protocol following bolus administration of intravenous contrast. CONTRAST:  123mL OMNIPAQUE IOHEXOL 300 MG/ML  SOLN COMPARISON:  05/20/2016 and prior CTs FINDINGS: Lower chest: LEFT basilar atelectasis noted. Hepatobiliary: The liver and gallbladder are unremarkable. Pancreas: Unremarkable Spleen: Unremarkable Adrenals/Urinary Tract: Moderate to marked distention of bladder again noted. Kidneys and adrenal glands are unremarkable. Stomach/Bowel: A large hiatal hernia is noted with a distended stomach in the LOWER LEFT chest/UPPER abdomen-but unchanged in appearance and configuration since 08/05/2015 No dilated small bowel loops identified. No colonic abnormalities are identified. Vascular/Lymphatic: Aortic atherosclerosis. No enlarged abdominal or pelvic lymph nodes. Reproductive: Uterus and bilateral adnexa are unremarkable. Other: No ascites, pneumoperitoneum or abscess. A moderate paraumbilical hernia containing fat is present. Musculoskeletal: No acute abnormalities identified. Lumbar scoliosis and moderate to severe multilevel degenerative changes again noted. LEFT hip arthroplasty again identified. IMPRESSION: 1. Large hiatal hernia with distended stomach in the LOWER LEFT chest/UPPER abdomen with unchanged appearance and configuration of the stomach when compared to 08/05/2015. 2. Distended  bladder. 3. Moderate paraumbilical hernia containing fat 4.  Aortic Atherosclerosis (ICD10-I70.0). Electronically Signed   By: Margarette Canada M.D.   On: 06/02/2018 23:28    Procedures Procedures (including critical care time)  Medications Ordered in ED Medications  metoCLOPramide (REGLAN) injection 5 mg (has no administration in time range)  famotidine (PEPCID) IVPB 20 mg premix (has no administration in time range)  ondansetron (ZOFRAN) injection 4 mg (has no administration in time range)  hydrALAZINE (APRESOLINE) injection 5 mg (has no administration in time range)  acetaminophen (TYLENOL) suppository 650 mg (has no administration in time range)  0.9 %  sodium chloride infusion (has no administration in time range)  fentaNYL (SUBLIMAZE) injection 12.5 mcg (has no administration in time range)  enoxaparin (LOVENOX) injection 40 mg (has no administration in time range)  lactated ringers bolus 1,000 mL (0 mLs Intravenous Stopped 06/02/18 2248)  diphenhydrAMINE (BENADRYL) injection 12.5 mg (12.5 mg Intravenous Given 06/02/18 2213)  prochlorperazine (COMPAZINE) injection 2.5 mg (2.5 mg Intravenous Given 06/02/18 2214)  iohexol (OMNIPAQUE) 300 MG/ML solution 100 mL (100 mLs Intravenous Contrast Given 06/02/18 2307)  lactated ringers bolus 1,000 mL (1,000 mLs Intravenous New Bag/Given 06/02/18 2325)  metoCLOPramide (REGLAN) injection 5 mg (5 mg Intravenous Given 06/02/18 2340)     Initial Impression / Assessment and Plan / ED Course  I have reviewed the triage vital signs and the nursing notes.  Pertinent labs & imaging results that were available during my care of the patient were reviewed by me and considered in my medical decision making (see chart for details).  Clinical picture most consistent with intractable vomiting due to hiatal hernia. Abdominal pain is epigastric. CT shows large hiatal hernia with distended stomach similar to prior along with distended bladder. Significant lab results include  elevated lactic acid to 2.5, leukocytosis to 16.5, and normal lipase. CT also showed distended bladder. Straight cath returned small volume of urine. Will place Foley. UA not suggestive of infection. Good LVEF on most recent TTE. Patient hydrated with 2 L IV LR and given multiple antiemetics without relief.   Chest pain is most likely GI in etiology. EKG shows no signs of acute ischemia and initial troponin is negative. Will defer further ACS r/o to admitting team.   Case discussed with hospitalist who accepted patient for admission.  Final Clinical  Impressions(s) / ED Diagnoses   Final diagnoses:  Epigastric pain  Vomiting in adult  Hiatal hernia     Prescilla Sours, MD 06/03/18 0036    Rex Kras Wenda Overland, MD 06/05/18 1728

## 2018-06-02 NOTE — ED Triage Notes (Signed)
Patient BIB [facility] from [facility] for CP and vomiting. Per EMS patient started experiencing CP and vomiting this morning, PA from [facility] ordered chest x-ray and EKG (facility states they still haven't received the results from the EKG). Given oxycodone at facility and still c/o CP so they called EMS. Patient states the pain radiates down her LEFT neck into LEFT arm. Also c/o abdominal pain from vomiting all day. EMS gave 2 NTG with relief and 4mg  of Zofran.   Allergy to aspirin.

## 2018-06-03 ENCOUNTER — Observation Stay (HOSPITAL_BASED_OUTPATIENT_CLINIC_OR_DEPARTMENT_OTHER): Payer: Medicare Other

## 2018-06-03 ENCOUNTER — Other Ambulatory Visit: Payer: Self-pay

## 2018-06-03 ENCOUNTER — Encounter (HOSPITAL_COMMUNITY): Payer: Self-pay | Admitting: Internal Medicine

## 2018-06-03 DIAGNOSIS — D72829 Elevated white blood cell count, unspecified: Secondary | ICD-10-CM

## 2018-06-03 DIAGNOSIS — R1013 Epigastric pain: Secondary | ICD-10-CM

## 2018-06-03 DIAGNOSIS — R079 Chest pain, unspecified: Secondary | ICD-10-CM

## 2018-06-03 DIAGNOSIS — I340 Nonrheumatic mitral (valve) insufficiency: Secondary | ICD-10-CM | POA: Diagnosis not present

## 2018-06-03 DIAGNOSIS — R7989 Other specified abnormal findings of blood chemistry: Secondary | ICD-10-CM | POA: Diagnosis present

## 2018-06-03 DIAGNOSIS — I5032 Chronic diastolic (congestive) heart failure: Secondary | ICD-10-CM | POA: Diagnosis not present

## 2018-06-03 DIAGNOSIS — K219 Gastro-esophageal reflux disease without esophagitis: Secondary | ICD-10-CM | POA: Diagnosis not present

## 2018-06-03 DIAGNOSIS — I472 Ventricular tachycardia: Secondary | ICD-10-CM | POA: Diagnosis not present

## 2018-06-03 DIAGNOSIS — I110 Hypertensive heart disease with heart failure: Secondary | ICD-10-CM | POA: Diagnosis not present

## 2018-06-03 DIAGNOSIS — N390 Urinary tract infection, site not specified: Secondary | ICD-10-CM | POA: Diagnosis not present

## 2018-06-03 DIAGNOSIS — K449 Diaphragmatic hernia without obstruction or gangrene: Secondary | ICD-10-CM | POA: Diagnosis not present

## 2018-06-03 DIAGNOSIS — R6510 Systemic inflammatory response syndrome (SIRS) of non-infectious origin without acute organ dysfunction: Secondary | ICD-10-CM | POA: Diagnosis present

## 2018-06-03 DIAGNOSIS — I700 Atherosclerosis of aorta: Secondary | ICD-10-CM | POA: Diagnosis not present

## 2018-06-03 DIAGNOSIS — R109 Unspecified abdominal pain: Secondary | ICD-10-CM | POA: Diagnosis present

## 2018-06-03 DIAGNOSIS — A4151 Sepsis due to Escherichia coli [E. coli]: Secondary | ICD-10-CM | POA: Diagnosis not present

## 2018-06-03 DIAGNOSIS — I248 Other forms of acute ischemic heart disease: Secondary | ICD-10-CM | POA: Diagnosis not present

## 2018-06-03 DIAGNOSIS — E440 Moderate protein-calorie malnutrition: Secondary | ICD-10-CM | POA: Diagnosis not present

## 2018-06-03 DIAGNOSIS — I10 Essential (primary) hypertension: Secondary | ICD-10-CM

## 2018-06-03 DIAGNOSIS — K440 Diaphragmatic hernia with obstruction, without gangrene: Secondary | ICD-10-CM | POA: Insufficient documentation

## 2018-06-03 DIAGNOSIS — K567 Ileus, unspecified: Secondary | ICD-10-CM | POA: Diagnosis not present

## 2018-06-03 DIAGNOSIS — E860 Dehydration: Secondary | ICD-10-CM | POA: Diagnosis not present

## 2018-06-03 HISTORY — DX: Chronic diastolic (congestive) heart failure: I50.32

## 2018-06-03 LAB — URINALYSIS, ROUTINE W REFLEX MICROSCOPIC
BACTERIA UA: NONE SEEN
BILIRUBIN URINE: NEGATIVE
Glucose, UA: NEGATIVE mg/dL
Hgb urine dipstick: NEGATIVE
KETONES UR: 20 mg/dL — AB
Nitrite: NEGATIVE
PROTEIN: NEGATIVE mg/dL
SPECIFIC GRAVITY, URINE: 1.021 (ref 1.005–1.030)
pH: 7 (ref 5.0–8.0)

## 2018-06-03 LAB — ECHOCARDIOGRAM COMPLETE
HEIGHTINCHES: 61 in
WEIGHTICAEL: 2437.41 [oz_av]

## 2018-06-03 LAB — LIPID PANEL
CHOL/HDL RATIO: 3.7 ratio
CHOLESTEROL: 187 mg/dL (ref 0–200)
HDL: 51 mg/dL (ref >40)
LDL Cholesterol: 128 mg/dL — ABNORMAL HIGH (ref 0–99)
Triglycerides: 38 mg/dL (ref <150)
VLDL: 8 mg/dL (ref 0–40)

## 2018-06-03 LAB — BASIC METABOLIC PANEL
ANION GAP: 12 (ref 5–15)
BUN: 14 mg/dL (ref 8–23)
CALCIUM: 8.8 mg/dL — AB (ref 8.9–10.3)
CO2: 26 mmol/L (ref 22–32)
CREATININE: 0.75 mg/dL (ref 0.44–1.00)
Chloride: 97 mmol/L — ABNORMAL LOW (ref 98–111)
GFR calc non Af Amer: >60 mL/min (ref >60)
Glucose, Bld: 139 mg/dL — ABNORMAL HIGH (ref 70–99)
Potassium: 4 mmol/L (ref 3.5–5.1)
SODIUM: 135 mmol/L (ref 135–145)

## 2018-06-03 LAB — CBC
HCT: 35.7 % — ABNORMAL LOW (ref 36.0–46.0)
HEMOGLOBIN: 11.7 g/dL — AB (ref 12.0–15.0)
MCH: 29.3 pg (ref 26.0–34.0)
MCHC: 32.8 g/dL (ref 30.0–36.0)
MCV: 89.5 fL (ref 78.0–100.0)
PLATELETS: 381 10*3/uL (ref 150–400)
RBC: 3.99 MIL/uL (ref 3.87–5.11)
RDW: 12.5 % (ref 11.5–15.5)
WBC: 18 10*3/uL — AB (ref 4.0–10.5)

## 2018-06-03 LAB — TROPONIN I
TROPONIN I: 0.05 ng/mL — AB (ref <0.03)
TROPONIN I: 0.07 ng/mL — AB (ref <0.03)
Troponin I: 0.11 ng/mL (ref <0.03)

## 2018-06-03 LAB — MRSA PCR SCREENING: MRSA BY PCR: NEGATIVE

## 2018-06-03 LAB — GLUCOSE, CAPILLARY: Glucose-Capillary: 154 mg/dL — ABNORMAL HIGH (ref 70–99)

## 2018-06-03 LAB — PROCALCITONIN

## 2018-06-03 LAB — HEMOGLOBIN A1C
Hgb A1c MFr Bld: 5.5 % (ref 4.8–5.6)
MEAN PLASMA GLUCOSE: 111.15 mg/dL

## 2018-06-03 LAB — LACTIC ACID, PLASMA
Lactic Acid, Venous: 2.3 mmol/L (ref 0.5–1.9)
Lactic Acid, Venous: 2.1 mmol/L (ref 0.5–1.9)

## 2018-06-03 LAB — BRAIN NATRIURETIC PEPTIDE: B Natriuretic Peptide: 567.9 pg/mL — ABNORMAL HIGH (ref 0.0–100.0)

## 2018-06-03 MED ORDER — FAMOTIDINE IN NACL 20-0.9 MG/50ML-% IV SOLN
20.0000 mg | Freq: Two times a day (BID) | INTRAVENOUS | Status: DC
  Administered 2018-06-03 (×3): 20 mg via INTRAVENOUS
  Filled 2018-06-03 (×3): qty 50

## 2018-06-03 MED ORDER — SODIUM CHLORIDE 0.9 % IV SOLN
INTRAVENOUS | Status: AC
  Administered 2018-06-03 – 2018-06-05 (×3): via INTRAVENOUS

## 2018-06-03 MED ORDER — PROMETHAZINE HCL 25 MG/ML IJ SOLN
12.5000 mg | Freq: Once | INTRAMUSCULAR | Status: AC
  Administered 2018-06-03: 12.5 mg via INTRAVENOUS
  Filled 2018-06-03: qty 1

## 2018-06-03 MED ORDER — FENTANYL CITRATE (PF) 100 MCG/2ML IJ SOLN
12.5000 ug | INTRAMUSCULAR | Status: DC | PRN
  Filled 2018-06-03: qty 2

## 2018-06-03 MED ORDER — ONDANSETRON HCL 4 MG/2ML IJ SOLN
4.0000 mg | Freq: Three times a day (TID) | INTRAMUSCULAR | Status: DC | PRN
  Administered 2018-06-03 – 2018-06-15 (×7): 4 mg via INTRAVENOUS
  Filled 2018-06-03 (×6): qty 2

## 2018-06-03 MED ORDER — SODIUM CHLORIDE 0.9 % IV BOLUS
500.0000 mL | Freq: Once | INTRAVENOUS | Status: AC
  Administered 2018-06-03: 500 mL via INTRAVENOUS

## 2018-06-03 MED ORDER — METOCLOPRAMIDE HCL 5 MG/ML IJ SOLN
5.0000 mg | Freq: Three times a day (TID) | INTRAMUSCULAR | Status: DC
  Administered 2018-06-03 – 2018-06-22 (×57): 5 mg via INTRAVENOUS
  Filled 2018-06-03 (×57): qty 2

## 2018-06-03 MED ORDER — HYDRALAZINE HCL 20 MG/ML IJ SOLN
5.0000 mg | INTRAMUSCULAR | Status: DC | PRN
  Administered 2018-06-06: 5 mg via INTRAVENOUS
  Filled 2018-06-03: qty 1

## 2018-06-03 MED ORDER — ENOXAPARIN SODIUM 40 MG/0.4ML ~~LOC~~ SOLN
40.0000 mg | SUBCUTANEOUS | Status: DC
  Administered 2018-06-03: 40 mg via SUBCUTANEOUS
  Filled 2018-06-03 (×2): qty 0.4

## 2018-06-03 MED ORDER — SODIUM CHLORIDE 0.9 % IV SOLN
1.0000 g | INTRAVENOUS | Status: DC
  Administered 2018-06-03 – 2018-06-06 (×4): 1 g via INTRAVENOUS
  Filled 2018-06-03 (×4): qty 10

## 2018-06-03 MED ORDER — NITROGLYCERIN 0.4 MG SL SUBL: 0.4000 mg | SUBLINGUAL_TABLET | SUBLINGUAL | Status: DC | PRN

## 2018-06-03 MED ORDER — ACETAMINOPHEN 650 MG RE SUPP: 650.0000 mg | Freq: Four times a day (QID) | RECTAL | Status: DC | PRN

## 2018-06-03 MED ORDER — CIPROFLOXACIN IN D5W 400 MG/200ML IV SOLN: 400.0000 mg | Freq: Two times a day (BID) | INTRAVENOUS | Status: DC

## 2018-06-03 NOTE — Progress Notes (Signed)
  Echocardiogram 2D Echocardiogram has been performed.  [name] 06/03/2018, 11:07 AM

## 2018-06-03 NOTE — Progress Notes (Signed)
PROGRESS NOTE                                                                                                                                                                                                             Patient Demographics:    [name], is a 84 y.o. female, DOB - [birth year], TJQ:[id number]  Admit date - 06/02/2018   Admitting Physician Ivor Costa, MD  Outpatient Primary MD for the patient is Deland Pretty, MD  LOS - 0   Chief Complaint  Patient presents with  . Chest Pain  . Abdominal Pain       Brief Narrative    This is a no charge note as patient admitted earlier today. HPI: [name] is a 84 y.o. female with medical history significant of hypertension, hyperlipidemia, GERD, mitral valve regurg, dCHF, hiatal hernia, who presents with intractable nausea, vomiting, abdominal pain, and chest pain.  Per patient's daughter, patient started having intractable nausea vomiting and abdominal pain since this morning.  Patient vomited constantly for the whole day, without blood in the vomitus.  Patient does not have diarrhea.  Her abdominal pain is located in the upper abdomen, constant, sharp, moderate, radiating to lower chest and left neck.  Patient also had mild chest pain earlier, which has resolved.  Currently patient does not have chest pain, shortness of breath, cough.  No fever or chills.  Patient has mild burning on urination recently, but no dysuria or urinary frequency.  She is taking trimethoprim chronically for prophylaxis of her UTI.  No unilateral weakness.  Of note, patient has history of large hiatal hernia, and had similar presentation of nausea, vomiting and abdominal pain in the past.  Per her daughter, patient is not a good candidate for surgery.  ED Course: pt was found to have WBC 16.5, lactic acid 2.51, negative troponin, lipase 22, electrolytes renal function okay, pending urinalysis, temperature normal,  tachycardia, tachypnea, oxygen saturation 94% on room air.  CT abdomen/pelvis showed: 1. Large hiatal hernia with distended stomach in the LOWER LEFT chest/UPPER abdomen with unchanged appearance and configuration of the stomach when compared to 08/05/2015. 2. Distended bladder. 3. Moderate paraumbilical hernia containing fat 4. Aortic Atherosclerosis (ICD10-I70.0).     Subjective:    [name] today complains of some epigastric pain, had urinary retention this morning which  she had Foley catheter inserted .   Assessment  & Plan :    Principal Problem:   Hiatal hernia Active Problems:   Leukocytosis   Essential hypertension, benign   Chest pain   Abdominal pain   GERD (gastroesophageal reflux disease)   Elevated lactic acid level   Chronic diastolic CHF (congestive heart failure) (HCC)   SIRS (systemic inflammatory response syndrome) (HCC)    Hiatal hernia: Patient's intractable nausea vomiting and abdominal pain most likely caused by large hiatal hernia as shown by CT scanning.  This is a recurrent issue.  Per her daughter, patient is not a good candidate for surgery.  Will treat patient with supportive care, hoping symptoms will subside.    -will place on tele bed for obs -Reglan 5 mg every 8 hours, as needed Zofran -Pepcid 20 mg twice daily -IV fluid: 2 L Ringer's solution was ordered by EDP, will continue normal saline at 75 cc/h-->50 cc/h -Hold all oral medications until symptoms improves -prn low dose of Fentanyl 12.5 mg q4h (pt is allergic to morphine)  Chest pain with elevated troponins: -This pain most likely related to her abdominal pain nausea vomiting, troponin most likely related in the setting of demand ischemia, cardiology input greatly appreciated, echo with normal EF and without regional wall motion abnormalities, -pt is allergic to ASA  SIRS: pt elevated lactic acid 2.51, leukocytosis with WBC 15.5, tachycardia tachypnea.  No fever. Does not seem  to have infection except for pending UA due to mild burning sensation. Likely due to stress and dehydration. --will get Procalcitonin and trend lactic acid levels per sepsis protocol. -IVF: as above UTI, continue with Rocephin, as well with urinary retention, required Foley catheter insertion  Essential hypertension: Bp 156/87. -hold oral Meds  -IV hydralazine as needed  GERD (gastroesophageal reflux disease) -IV pepcid  Chronic diastolic CHF (congestive heart failure) ([facility]): 2D echo on 05/06/2014 showed EF of 55 with grade 1 diastolic dysfunction.  Patient does not have leg edema.  No shortness of breath.  CHF seems to be compensated. -Hold spironolactone - Check BNP         Code Status : DNR  Family Communication  : none at bedside  Disposition Plan  : SNF  Consults  :  cardiology  Procedures  : none  DVT Prophylaxis  :  [facility] lovenox  Lab Results  Component Value Date   PLT 381 06/03/2018    Antibiotics  :    Anti-infectives (From admission, onward)   Start     Dose/Rate Route Frequency Ordered Stop   06/03/18 0900  ciprofloxacin (CIPRO) IVPB 400 mg  Status:  Discontinued     400 mg 200 mL/hr over 60 Minutes Intravenous Every 12 hours 06/03/18 0757 06/03/18 0759   06/03/18 0800  cefTRIAXone (ROCEPHIN) 1 g in sodium chloride 0.9 % 100 mL IVPB     1 g 200 mL/hr over 30 Minutes Intravenous Every 24 hours 06/03/18 0759          Objective:   Vitals:   06/03/18 0135 06/03/18 0511 06/03/18 0737 06/03/18 1437  BP: (!) 157/102 (!) 163/96 (!) 151/87 (!) 156/91  Pulse: (!) 107 96 96 100  Resp: (!) 22 18  18   Temp: 99 F (37.2 C) 98.8 F (37.1 C)  98.9 F (37.2 C)  TempSrc: Oral Oral    SpO2: 95% 98%  96%  Weight: 69.1 kg (152 lb 5.4 oz)     Height: 5\' 1"  (1.549 m)  Wt Readings from Last 3 Encounters:  06/03/18 69.1 kg (152 lb 5.4 oz)  03/22/18 73.9 kg (163 lb)  10/06/16 70.9 kg (156 lb 6.4 oz)     Intake/Output Summary (Last 24 hours) at  06/03/2018 1541 Last data filed at 06/03/2018 1000 Gross per 24 hour  Intake 371.18 ml  Output 1100 ml  Net -728.82 ml     Physical Exam  Frail elderly female, laying in bed in some discomfort Symmetrical Chest wall movement, Good air movement bilaterally, CTAB RRR,No Gallops,Rubs or new Murmurs, No Parasternal Heave +ve B.Sounds, Abd Soft, has some generalized abdomen tenderness, mainly in suprapubic area with some fullness (before foley inseration) No rebound - guarding or rigidity. No Cyanosis, Clubbing or edema, No new Rash or bruise    Data Review:    CBC Recent Labs  Lab 06/02/18 2053 06/03/18 0606  WBC 16.5* 18.0*  HGB 12.0 11.7*  HCT 36.4 35.7*  PLT 432* 381  MCV 90.1 89.5  MCH 29.7 29.3  MCHC 33.0 32.8  RDW 12.5 12.5    Chemistries  Recent Labs  Lab 06/02/18 2053 06/02/18 2234 06/03/18 0606  NA 132*  --  135  K 4.3  --  4.0  CL 94*  --  97*  CO2 24  --  26  GLUCOSE 162*  --  139*  BUN 17  --  14  CREATININE 0.80  --  0.75  CALCIUM 9.2  --  8.8*  AST  --  23  --   ALT  --  14  --   ALKPHOS  --  78  --   BILITOT  --  0.7  --    ------------------------------------------------------------------------------------------------------------------ Recent Labs    06/03/18 0606  CHOL 187  HDL 51  LDLCALC 128*  TRIG 38  CHOLHDL 3.7    Lab Results  Component Value Date   HGBA1C 5.5 06/03/2018   ------------------------------------------------------------------------------------------------------------------ No results for input(s): TSH, T4TOTAL, T3FREE, THYROIDAB in the last 72 hours.  Invalid input(s): FREET3 ------------------------------------------------------------------------------------------------------------------ No results for input(s): VITAMINB12, FOLATE, FERRITIN, TIBC, IRON, RETICCTPCT in the last 72 hours.  Coagulation profile No results for input(s): INR, PROTIME in the last 168 hours.  No results for input(s): DDIMER in the last  72 hours.  Cardiac Enzymes Recent Labs  Lab 06/03/18 0254 06/03/18 0606 06/03/18 1204  TROPONINI 0.05* 0.07* 0.11*   ------------------------------------------------------------------------------------------------------------------    Component Value Date/Time   BNP 567.9 (H) 06/03/2018 0254    Inpatient Medications  Scheduled Meds: . enoxaparin (LOVENOX) injection  40 mg Subcutaneous Q24H  . metoCLOPramide (REGLAN) injection  5 mg Intravenous Q8H   Continuous Infusions: . sodium chloride 50 mL/hr at 06/03/18 0447  . cefTRIAXone (ROCEPHIN)  IV 1 g (06/03/18 1117)  . famotidine (PEPCID) IV 20 mg (06/03/18 1000)   PRN Meds:.acetaminophen, fentaNYL (SUBLIMAZE) injection, hydrALAZINE, nitroGLYCERIN, ondansetron (ZOFRAN) IV  Micro Results Recent Results (from the past 240 hour(s))  MRSA PCR Screening     Status: None   Collection Time: 06/03/18  4:54 AM  Result Value Ref Range Status   MRSA by PCR NEGATIVE NEGATIVE Final    Comment:        The GeneXpert MRSA Assay (FDA approved for NASAL specimens only), is one component of a comprehensive MRSA colonization surveillance program. It is not intended to diagnose MRSA infection nor to guide or monitor treatment for MRSA infections. Performed at [facility], [place] [address], [place], [place] [address]     Radiology  Reports Dg Chest 2 View  Result Date: 06/02/2018 CLINICAL DATA:  Chest pain and vomiting. EXAM: CHEST - 2 VIEW COMPARISON:  03/19/2018 and chest CT 08/05/2015 FINDINGS: Lungs are somewhat hypoinflated demonstrate mild opacification over the left base which may be due to small amount of pleural fluid/atelectasis. Mild prominence of the perihilar markings suggesting mild degree of vascular congestion. Mild stable cardiomegaly. Stable elevation of the left hemidiaphragm with large hiatal hernia unchanged. Remainder of the exam is unchanged. IMPRESSION: Mild cardiomegaly with suggestion of mild vascular  congestion. Mild left base opacification likely small effusion with atelectasis. Large hiatal hernia unchanged. Electronically Signed   By: Marin Olp M.D.   On: 06/02/2018 21:42   Ct Abdomen Pelvis W Contrast  Result Date: 06/02/2018 CLINICAL DATA:  84 year old female with acute abdominal pain and vomiting today. EXAM: CT ABDOMEN AND PELVIS WITH CONTRAST TECHNIQUE: Multidetector CT imaging of the abdomen and pelvis was performed using the standard protocol following bolus administration of intravenous contrast. CONTRAST:  154mL OMNIPAQUE IOHEXOL 300 MG/ML  SOLN COMPARISON:  05/20/2016 and prior CTs FINDINGS: Lower chest: LEFT basilar atelectasis noted. Hepatobiliary: The liver and gallbladder are unremarkable. Pancreas: Unremarkable Spleen: Unremarkable Adrenals/Urinary Tract: Moderate to marked distention of bladder again noted. Kidneys and adrenal glands are unremarkable. Stomach/Bowel: A large hiatal hernia is noted with a distended stomach in the LOWER LEFT chest/UPPER abdomen-but unchanged in appearance and configuration since 08/05/2015 No dilated small bowel loops identified. No colonic abnormalities are identified. Vascular/Lymphatic: Aortic atherosclerosis. No enlarged abdominal or pelvic lymph nodes. Reproductive: Uterus and bilateral adnexa are unremarkable. Other: No ascites, pneumoperitoneum or abscess. A moderate paraumbilical hernia containing fat is present. Musculoskeletal: No acute abnormalities identified. Lumbar scoliosis and moderate to severe multilevel degenerative changes again noted. LEFT hip arthroplasty again identified. IMPRESSION: 1. Large hiatal hernia with distended stomach in the LOWER LEFT chest/UPPER abdomen with unchanged appearance and configuration of the stomach when compared to 08/05/2015. 2. Distended bladder. 3. Moderate paraumbilical hernia containing fat 4.  Aortic Atherosclerosis (ICD10-I70.0). Electronically Signed   By: Margarette Canada M.D.   On: 06/02/2018 23:28      Phillips Climes M.D on 06/03/2018 at 3:41 PM  Between 7am to 7pm - Pager - [phone]  After 7pm go to [email or web address] - password [facility]  [facility] -  Office  [phone]

## 2018-06-03 NOTE — Progress Notes (Signed)
CRITICAL VALUE ALERT  Critical Value:  Potassium - 2.7  Date & Time Notied:  06/03/18 ; 6: 45  Provider Notified: Vicente Males, NP  Orders Received/Actions taken: Potassium replenishment

## 2018-06-03 NOTE — Progress Notes (Signed)
Bladder scan for 861mL. Pt with no urge to void, still having nausea and spitting up small amounts of brownish emesis. MD contacted re orders for Foley vs in and out.

## 2018-06-03 NOTE — Progress Notes (Signed)
CRITICAL VALUE ALERT  Critical Value:  Lactic Acid - 2.3  Date & Time Notied:  06/03/18 ; 3:45am2  Provider Notified: Silas Sacramento, NP  Orders Received/Actions taken: Will continue to monitor

## 2018-06-03 NOTE — H&P (Addendum)
History and Physical    [name] HMC:[id number] DOB: 01/08/1926 DOA: 06/02/2018  Referring MD/NP/PA:   PCP: Deland Pretty, MD   Patient coming from:  The patient is coming from SNF.  At baseline, pt is dependent for most of ADL.   Chief Complaint: Intractable nausea, vomiting and abdominal pain, and chest pain  HPI: [name] is a 84 y.o. female with medical history significant of hypertension, hyperlipidemia, GERD, mitral valve regurg, dCHF, hiatal hernia, who presents with intractable nausea, vomiting, abdominal pain, and chest pain.  Per patient's daughter, patient started having intractable nausea vomiting and abdominal pain since this morning.  Patient vomited constantly for the whole day, without blood in the vomitus.  Patient does not have diarrhea.  Her abdominal pain is located in the upper abdomen, constant, sharp, moderate, radiating to lower chest and left neck.  Patient also had mild chest pain earlier, which has resolved.  Currently patient does not have chest pain, shortness of breath, cough.  No fever or chills.  Patient has mild burning on urination recently, but no dysuria or urinary frequency.  She is taking trimethoprim chronically for prophylaxis of her UTI.  No unilateral weakness.  Of note, patient has history of large hiatal hernia, and had similar presentation of nausea, vomiting and abdominal pain in the past.  Per her daughter, patient is not a good candidate for surgery.  ED Course: pt was found to have WBC 16.5, lactic acid 2.51, negative troponin, lipase 22, electrolytes renal function okay, pending urinalysis, temperature normal, tachycardia, tachypnea, oxygen saturation 94% on room air.  CT abdomen/pelvis showed: 1. Large hiatal hernia with distended stomach in the LOWER LEFT chest/UPPER abdomen with unchanged appearance and configuration of the stomach when compared to 08/05/2015. 2. Distended bladder. 3. Moderate paraumbilical hernia containing  fat 4.  Aortic Atherosclerosis (ICD10-I70.0).  Review of Systems:   General: no fevers, chills, no body weight gain, has poor appetite, has fatigue HEENT: no blurry vision, hearing changes or sore throat Respiratory: no dyspnea, coughing, wheezing CV: had chest pain, no palpitations GI: has nausea, vomiting, abdominal pain, no diarrhea, constipation GU: no dysuria, has burning on urination, no increased urinary frequency, hematuria  Ext: no leg edema Neuro: no unilateral weakness, numbness, or tingling, no vision change or hearing loss Skin: no rash, no skin tear. MSK: No muscle spasm, no deformity, no limitation of range of movement in spin Heme: No easy bruising.  Travel history: No recent long distant travel.  Allergy:  Allergies  Allergen Reactions  . Aspirin Other (See Comments)    Unknown  . Boniva [Ibandronic Acid] Other (See Comments)    Unknown  . Morphine Other (See Comments)    Unknown  . Nitrofuran Derivatives Other (See Comments)    Unknown  . Penicillins Other (See Comments)    Received Ancef without problem  Has patient had a PCN reaction causing immediate rash, facial/tongue/throat swelling, SOB or lightheadedness with hypotension: Unknown Has patient had a PCN reaction causing severe rash involving mucus membranes or skin necrosis: Unknown Has patient had a PCN reaction that required hospitalization: Unknown Has patient had a PCN reaction occurring within the last 10 years: Unknown If all of the above answers are "NO", then may proceed with Cephalosporin use.   . Sulfonamide Derivatives Other (See Comments)    Unknown    Past Medical History:  Diagnosis Date  . Actinic keratosis   . Allergic rhinitis   . Back pain   . Chronic  constipation   . Chronic diastolic CHF (congestive heart failure) ([place]) 06/03/2018  . GERD (gastroesophageal reflux disease)   . Hyperlipidemia   . Hypertension   . Hyponatremia   . Leg edema   . Macular degeneration   .  Mitral regurgitation    mild by echo 2003  . Osteoarthritis   . Osteoarthrosis   . Osteoporosis   . Tenosynovitis    myxomatous    Past Surgical History:  Procedure Laterality Date  . CARPAL TUNNEL RELEASE     bilateral  . hernial repain    . INGUINAL HERNIA REPAIR     laparoscopic preperitoneal repair of bilateral inguinal hernias  . REPLACEMENT TOTAL KNEE BILATERAL    . REVERSE SHOULDER ARTHROPLASTY Right 05/08/2014   Procedure: REVERSE SHOULDER ARTHROPLASTY RIGHT ;  Surgeon: Marin Shutter, MD;  Location: [facility];  Service: Orthopedics;  Laterality: Right;  . SYNOVECTOMY     of right long finger  . tonisellectomy    . tonisillectomy    . TONSILLECTOMY      Social History:  reports that she quit smoking about 50 years ago. Her smoking use included cigarettes. She has a 2.00 pack-year smoking history. She has never used smokeless tobacco. She reports that she drinks about 1.2 oz of alcohol per week. She reports that she does not use drugs.  Family History:  Family History  Problem Relation Age of Onset  . Heart attack Father   . Heart attack Brother      Prior to Admission medications   Medication Sig Start Date End Date Taking? Authorizing Provider  Calcium Carbonate-Vitamin D3 (CALCIUM 600+D3) 600-400 MG-UNIT TABS Take 1 tablet by mouth daily.   Yes [provider]  cetirizine (ZYRTEC) 10 MG tablet Take 10 mg by mouth daily.    Yes [provider]  docusate sodium (COLACE) 100 MG capsule Take 100 mg by mouth 2 (two) times daily.   Yes [provider]  estradiol (ESTRACE) 0.1 MG/GM vaginal cream Place 1 Applicatorful vaginally 2 (two) times a week. Mondays and Thursdays   Yes [provider]  gabapentin (NEURONTIN) 100 MG capsule Take 100 mg by mouth at bedtime.   Yes [provider]  irbesartan (AVAPRO) 75 MG tablet Take 1 tablet (75 mg total) by mouth daily. 05/10/14  Yes Tat, Shanon Brow, MD  Multiple Vitamins-Minerals (CENTRUM SILVER  ULTRA WOMENS PO) Take 1 tablet by mouth daily.   Yes [provider]  Multiple Vitamins-Minerals (PRESERVISION AREDS 2 PO) Take 2 tablets by mouth daily.    Yes [provider]  omeprazole (PRILOSEC) 20 MG capsule Take 20 mg by mouth daily.   Yes [provider]  ondansetron (ZOFRAN) 4 MG tablet Take 4 mg by mouth every 6 (six) hours as needed for nausea or vomiting.   Yes [provider]  oxyCODONE (OXY IR/ROXICODONE) 5 MG immediate release tablet Take 5 mg by mouth every 8 (eight) hours as needed for severe pain.   Yes [provider]  Probiotic Product (PROBIOTIC DAILY) CAPS Take 250 mg by mouth daily.    Yes [provider]  spironolactone (ALDACTONE) 25 MG tablet Take 25 mg by mouth daily.    Yes [provider]  trimethoprim (TRIMPEX) 100 MG tablet Take 100 mg by mouth daily. UTI prophylaxis.   Yes [provider]  pseudoephedrine-guaifenesin (MUCINEX D) 60-600 MG 12 hr tablet Take 1 tablet by mouth every 12 (twelve) hours. 06/02/18 06/05/18  [provider]  Physical Exam: Vitals:   06/02/18 2315 06/02/18 2330 06/03/18 0015 06/03/18 0030  BP: (!) 156/87 (!) 113/93 (!) 151/93 (!) 162/83  Pulse: 99 (!) 105 (!) 103 (!) 106  Resp: 19 (!) 24 (!) 23 19  Temp:      TempSrc:      SpO2: 94% 95% 95% 97%  Weight:      Height:       General: Not in acute distress HEENT:       Eyes: PERRL, EOMI, no scleral icterus.       ENT: No discharge from the ears and nose, no pharynx injection, no tonsillar enlargement.        Neck: No JVD, no bruit, no mass felt. Heme: No neck lymph node enlargement. Cardiac: S1/S2, RRR, No murmurs, No gallops or rubs. Respiratory: No rales, wheezing, rhonchi or rubs. GI: Soft, nondistended, has tenderness in upper abdomen, no rebound pain, no organomegaly, BS present. Has a small reducible periumbilical hernia. GU: No hematuria Ext: No pitting leg edema bilaterally. 2+DP/PT pulse  bilaterally. Musculoskeletal: No joint deformities, No joint redness or warmth, no limitation of ROM in spin. Skin: No rashes.  Neuro: Alert, oriented X3, cranial nerves II-XII grossly intact, moves all extremities normally.  Psych: Patient is not psychotic, no suicidal or hemocidal ideation.  Labs on Admission: I have personally reviewed following labs and imaging studies  CBC: Recent Labs  Lab 06/02/18 2053  WBC 16.5*  HGB 12.0  HCT 36.4  MCV 90.1  PLT 841*   Basic Metabolic Panel: Recent Labs  Lab 06/02/18 2053  NA 132*  K 4.3  CL 94*  CO2 24  GLUCOSE 162*  BUN 17  CREATININE 0.80  CALCIUM 9.2   GFR: Estimated Creatinine Clearance: 41.9 mL/min (by C-G formula based on SCr of 0.8 mg/dL). Liver Function Tests: Recent Labs  Lab 06/02/18 2234  AST 23  ALT 14  ALKPHOS 78  BILITOT 0.7  PROT 7.0  ALBUMIN 3.6   Recent Labs  Lab 06/02/18 2053  LIPASE 22   No results for input(s): AMMONIA in the last 168 hours. Coagulation Profile: No results for input(s): INR, PROTIME in the last 168 hours. Cardiac Enzymes: No results for input(s): CKTOTAL, CKMB, CKMBINDEX, TROPONINI in the last 168 hours. BNP (last 3 results) No results for input(s): PROBNP in the last 8760 hours. HbA1C: No results for input(s): HGBA1C in the last 72 hours. CBG: No results for input(s): GLUCAP in the last 168 hours. Lipid Profile: No results for input(s): CHOL, HDL, LDLCALC, TRIG, CHOLHDL, LDLDIRECT in the last 72 hours. Thyroid Function Tests: No results for input(s): TSH, T4TOTAL, FREET4, T3FREE, THYROIDAB in the last 72 hours. Anemia Panel: No results for input(s): VITAMINB12, FOLATE, FERRITIN, TIBC, IRON, RETICCTPCT in the last 72 hours. Urine analysis:    Component Value Date/Time   COLORURINE YELLOW 06/02/2018 2352   APPEARANCEUR CLEAR 06/02/2018 2352   LABSPEC 1.021 06/02/2018 2352   PHURINE 7.0 06/02/2018 2352   GLUCOSEU NEGATIVE 06/02/2018 2352   HGBUR NEGATIVE 06/02/2018  2352   BILIRUBINUR NEGATIVE 06/02/2018 2352   KETONESUR 20 (A) 06/02/2018 2352   PROTEINUR NEGATIVE 06/02/2018 2352   UROBILINOGEN 0.2 05/05/2014 2014   NITRITE NEGATIVE 06/02/2018 2352   LEUKOCYTESUR TRACE (A) 06/02/2018 2352   Sepsis Labs: @LABRCNTIP (procalcitonin:4,lacticidven:4) )No results found for this or any previous visit (from the past 240 hour(s)).   Radiological Exams on Admission: Dg Chest 2 View  Result Date: 06/02/2018 CLINICAL DATA:  Chest pain and vomiting.  EXAM: CHEST - 2 VIEW COMPARISON:  03/19/2018 and chest CT 08/05/2015 FINDINGS: Lungs are somewhat hypoinflated demonstrate mild opacification over the left base which may be due to small amount of pleural fluid/atelectasis. Mild prominence of the perihilar markings suggesting mild degree of vascular congestion. Mild stable cardiomegaly. Stable elevation of the left hemidiaphragm with large hiatal hernia unchanged. Remainder of the exam is unchanged. IMPRESSION: Mild cardiomegaly with suggestion of mild vascular congestion. Mild left base opacification likely small effusion with atelectasis. Large hiatal hernia unchanged. Electronically Signed   By: Marin Olp M.D.   On: 06/02/2018 21:42   Ct Abdomen Pelvis W Contrast  Result Date: 06/02/2018 CLINICAL DATA:  84 year old female with acute abdominal pain and vomiting today. EXAM: CT ABDOMEN AND PELVIS WITH CONTRAST TECHNIQUE: Multidetector CT imaging of the abdomen and pelvis was performed using the standard protocol following bolus administration of intravenous contrast. CONTRAST:  116mL OMNIPAQUE IOHEXOL 300 MG/ML  SOLN COMPARISON:  05/20/2016 and prior CTs FINDINGS: Lower chest: LEFT basilar atelectasis noted. Hepatobiliary: The liver and gallbladder are unremarkable. Pancreas: Unremarkable Spleen: Unremarkable Adrenals/Urinary Tract: Moderate to marked distention of bladder again noted. Kidneys and adrenal glands are unremarkable. Stomach/Bowel: A large hiatal hernia is noted  with a distended stomach in the LOWER LEFT chest/UPPER abdomen-but unchanged in appearance and configuration since 08/05/2015 No dilated small bowel loops identified. No colonic abnormalities are identified. Vascular/Lymphatic: Aortic atherosclerosis. No enlarged abdominal or pelvic lymph nodes. Reproductive: Uterus and bilateral adnexa are unremarkable. Other: No ascites, pneumoperitoneum or abscess. A moderate paraumbilical hernia containing fat is present. Musculoskeletal: No acute abnormalities identified. Lumbar scoliosis and moderate to severe multilevel degenerative changes again noted. LEFT hip arthroplasty again identified. IMPRESSION: 1. Large hiatal hernia with distended stomach in the LOWER LEFT chest/UPPER abdomen with unchanged appearance and configuration of the stomach when compared to 08/05/2015. 2. Distended bladder. 3. Moderate paraumbilical hernia containing fat 4.  Aortic Atherosclerosis (ICD10-I70.0). Electronically Signed   By: Margarette Canada M.D.   On: 06/02/2018 23:28     EKG: Independently reviewed.  Sinus rhythm, QTC 465, low voltage, LAD, poor R wave progression.    Assessment/Plan Principal Problem:   Hiatal hernia Active Problems:   Leukocytosis   Essential hypertension, benign   Chest pain   Abdominal pain   GERD (gastroesophageal reflux disease)   Elevated lactic acid level   Chronic diastolic CHF (congestive heart failure) ([facility])   SIRS (systemic inflammatory response syndrome) ([facility])   Hiatal hernia: Patient's intractable nausea vomiting and abdominal pain most likely caused by large hiatal hernia as shown by CT scanning.  This is a recurrent issue.  Per her daughter, patient is not a good candidate for surgery.  Will treat patient with supportive care, hoping symptoms will subside.    -will place on tele bed for obs -Reglan 5 mg every 8 hours, as needed Zofran -Pepcid 20 mg twice daily -IV fluid: 2 L Ringer's solution was ordered by EDP, will continue normal  saline at 75 cc/h-->50 cc/h -Hold all oral medications until symptoms improves -prn low dose of Fentanyl 12.5 mg q4h (pt is allergic to morphine)  SIRS: pt elevated lactic acid 2.51, leukocytosis with WBC 15.5, tachycardia tachypnea.  No fever. Does not seem to have infection except for pending UA due to mild burning sensation. Likely due to stress and dehydration. --will get Procalcitonin and trend lactic acid levels per sepsis protocol. -IVF: as above -f/u UA and Bx.   Essential hypertension: Bp 156/87. -hold oral Meds  -  IV hydralazine as needed  GERD (gastroesophageal reflux disease) -IV pepcid  Chronic diastolic CHF (congestive heart failure) ([place]): 2D echo on 05/06/2014 showed EF of 55 with grade 1 diastolic dysfunction.  Patient does not have leg edema.  No shortness of breath.  CHF seems to be compensated. -Hold spironolactone - Check BNP  Chest pain: Patient had mild chest pain earlier, which has resolved.  Currently patient does not have any chest pain, shortness of breath.  Her chest pain is most likely due to upper abdominal pain radiating to the chest, initial troponin negative. -Follow-up troponin x3 to r/o ACS  -pt is allergic to ASA  Addendum: the repeated EKG is positive 0.05.  Patient does not have chest pain.  Possibly due to demand ischemia secondary to stress and SIRS - prn Nitroglycerin, fentanyl - Risk factor stratification: will check FLP and A1c - 2d echo - inpt card consult was requested via Epic   DVT ppx: SQ Lovenox Code Status: DNR (I discussed with patient in the presence of her daughter and son, and explained the meaning of CODE STATUS. Patient wants to be DNR) Family Communication: Yes, patient's daughter and son at bed side Disposition Plan:  Anticipate discharge back to previous home environment Consults called:  none Admission status: Obs / tele   Date of Service 06/03/2018    Ivor Costa [facility] Pager [phone]  If 7PM-7AM, please  contact night-coverage [email or web address] Password [email or web address] 06/03/2018, 12:55 AM

## 2018-06-03 NOTE — Progress Notes (Signed)
CRITICAL VALUE ALERT  Critical Value:  Troponin - 0.05  Date & Time Notied:  7/519  Provider Notified: Silas [place], NP  Orders Received/Actions taken: Will continue to monitor

## 2018-06-03 NOTE — Consult Note (Signed)
Cardiology Consultation:   Patient ID: [name]; [id number]; 11/04/1926   Admit date: 06/02/2018 Date of Consult: 06/03/2018  Primary Care Provider: Deland Pretty, MD Primary Cardiologist: Lauree Chandler, MD  Primary Electrophysiologist:  NA   Patient Profile:   [name] is a 84 y.o. female with a hx of moderate LVH and EF 55-60%, G1DD in 04/2014, large HH,   who is being seen today for the evaluation of chest pain and elevated troponin at the request of Dr. Waldron Labs.  History of Present Illness:   [name] has a hx of moderate LVH and EF 55-60%, G1DD in 04/2014, large HH, and stress test in 2012 neg for ischemia.    Hx of lower ext edema treated with aldactone with resolution.    Pt now admitted after presenting from [facility] for chest pain and vomiting.  Pain per EMS, into lt neck and into Lt arm and with abd pain and vomiting.  2 NTG with relief.    EKG   I personally reviewed ST with poor R wave progression ant. Leads. New from 03/19/18  But with today's EKG improved.  TELE I personally reviewed SR to ST with PVC   Troponin poc 0.06, and troponin 0.05 and 0.07  BNP 567 Na 135, k+ 4.0, CL 97, Cr. 0.75 WBC 16.5 and now 18K Hgb 12 HCT 36 Plts 432 T chol 187, HDL 51; LDL 128; TG 38 Lactic acid 2.51  Bld cultures pending  CXR 2 V Mild cardiomegaly with suggestion of mild vascular congestion. Mild left base opacification likely small effusion with atelectasis.  No change in large hiatal hernia  CT abd and pelvis. Large hiatal hernia with distended stomach in the LOWER LEFT chest/UPPER abdomen with unchanged appearance and configuration of the stomach when compared to 08/05/2015.  Distended Bladder.   Moderate paraumbilical hernia containing fat, + aortic atherosclerosis.   Since admit she has continued with vomiting.  She was found to have 838 ml in bladder.    Currently no chest pain, no SOB.  Her nausea and vomiting have improved with reglan.  She just had  foley catheter placed. BP is 163/96 to 151/87 T- pk is 99  ( In April admitted for AMS with taking too many percocet)   Yesterday her symptoms came on all at once.  Had not had any vomiting or chest pain prior to that.    Past Medical History:  Diagnosis Date  . Actinic keratosis   . Allergic rhinitis   . Back pain   . Chronic constipation   . Chronic diastolic CHF (congestive heart failure) ([place]) 06/03/2018  . GERD (gastroesophageal reflux disease)   . Hyperlipidemia   . Hypertension   . Hyponatremia   . Leg edema   . Macular degeneration   . Mitral regurgitation    mild by echo 2003  . Osteoarthritis   . Osteoarthrosis   . Osteoporosis   . Tenosynovitis    myxomatous    Past Surgical History:  Procedure Laterality Date  . CARPAL TUNNEL RELEASE     bilateral  . hernial repain    . INGUINAL HERNIA REPAIR     laparoscopic preperitoneal repair of bilateral inguinal hernias  . REPLACEMENT TOTAL KNEE BILATERAL    . REVERSE SHOULDER ARTHROPLASTY Right 05/08/2014   Procedure: REVERSE SHOULDER ARTHROPLASTY RIGHT ;  Surgeon: Marin Shutter, MD;  Location: [place];  Service: Orthopedics;  Laterality: Right;  . SYNOVECTOMY     of right long finger  .  tonisellectomy    . tonisillectomy    . TONSILLECTOMY       Home Medications:  Prior to Admission medications   Medication Sig Start Date End Date Taking? Authorizing Provider  Calcium Carbonate-Vitamin D3 (CALCIUM 600+D3) 600-400 MG-UNIT TABS Take 1 tablet by mouth daily.   Yes [provider]  cetirizine (ZYRTEC) 10 MG tablet Take 10 mg by mouth daily.    Yes [provider]  docusate sodium (COLACE) 100 MG capsule Take 100 mg by mouth 2 (two) times daily.   Yes [provider]  estradiol (ESTRACE) 0.1 MG/GM vaginal cream Place 1 Applicatorful vaginally 2 (two) times a week. Mondays and Thursdays   Yes [provider]  gabapentin (NEURONTIN) 100 MG capsule Take 100 mg by mouth at bedtime.   Yes  [provider]  irbesartan (AVAPRO) 75 MG tablet Take 1 tablet (75 mg total) by mouth daily. 05/10/14  Yes Tat, Alexandra Brow, MD  Multiple Vitamins-Minerals (CENTRUM SILVER ULTRA WOMENS PO) Take 1 tablet by mouth daily.   Yes [provider]  Multiple Vitamins-Minerals (PRESERVISION AREDS 2 PO) Take 2 tablets by mouth daily.    Yes [provider]  omeprazole (PRILOSEC) 20 MG capsule Take 20 mg by mouth daily.   Yes [provider]  ondansetron (ZOFRAN) 4 MG tablet Take 4 mg by mouth every 6 (six) hours as needed for nausea or vomiting.   Yes [provider]  oxyCODONE (OXY IR/ROXICODONE) 5 MG immediate release tablet Take 5 mg by mouth every 8 (eight) hours as needed for severe pain.   Yes [provider]  Probiotic Product (PROBIOTIC DAILY) CAPS Take 250 mg by mouth daily.    Yes [provider]  spironolactone (ALDACTONE) 25 MG tablet Take 25 mg by mouth daily.    Yes [provider]  trimethoprim (TRIMPEX) 100 MG tablet Take 100 mg by mouth daily. UTI prophylaxis.   Yes [provider]  pseudoephedrine-guaifenesin (MUCINEX D) 60-600 MG 12 hr tablet Take 1 tablet by mouth every 12 (twelve) hours. 06/02/18 06/05/18  [provider]    Inpatient Medications: Scheduled Meds: . enoxaparin (LOVENOX) injection  40 mg Subcutaneous Q24H  . metoCLOPramide (REGLAN) injection  5 mg Intravenous Q8H   Continuous Infusions: . sodium chloride 50 mL/hr at 06/03/18 0447  . cefTRIAXone (ROCEPHIN)  IV    . famotidine (PEPCID) IV 20 mg (06/03/18 1000)   PRN Meds: acetaminophen, fentaNYL (SUBLIMAZE) injection, hydrALAZINE, nitroGLYCERIN, ondansetron (ZOFRAN) IV  Allergies:    Allergies  Allergen Reactions  . Aspirin Other (See Comments)    Unknown  . Boniva [Ibandronic Acid] Other (See Comments)    Unknown  . Morphine Other (See Comments)    Unknown  . Nitrofuran Derivatives Other (See Comments)    Unknown  .  Penicillins Other (See Comments)    Received Ancef without problem  Has patient had a PCN reaction causing immediate rash, facial/tongue/throat swelling, SOB or lightheadedness with hypotension: Unknown Has patient had a PCN reaction causing severe rash involving mucus membranes or skin necrosis: Unknown Has patient had a PCN reaction that required hospitalization: Unknown Has patient had a PCN reaction occurring within the last 10 years: Unknown If all of the above answers are "NO", then may proceed with Cephalosporin use.   . Sulfonamide Derivatives Other (See Comments)    Unknown    Social History:   Social History   Socioeconomic History  . Marital status: Divorced    Spouse name: Not  on file  . Number of children: 4  . Years of education: Not on file  . Highest education level: Not on file  Occupational History  . Not on file  Social Needs  . Financial resource strain: Not on file  . Food insecurity:    Worry: Not on file    Inability: Not on file  . Transportation needs:    Medical: Not on file    Non-medical: Not on file  Tobacco Use  . Smoking status: Former Smoker    Packs/day: 1.00    Years: 2.00    Pack years: 2.00    Types: Cigarettes    Last attempt to quit: 12/07/1967    Years since quitting: 50.5  . Smokeless tobacco: Never Used  Substance and Sexual Activity  . Alcohol use: Yes    Alcohol/week: 1.2 oz    Types: 2 Glasses of wine per week    Comment: 2 glasses per week  . Drug use: No  . Sexual activity: Never  Lifestyle  . Physical activity:    Days per week: Not on file    Minutes per session: Not on file  . Stress: Not on file  Relationships  . Social connections:    Talks on phone: Not on file    Gets together: Not on file    Attends religious service: Not on file    Active member of club or organization: Not on file    Attends meetings of clubs or organizations: Not on file    Relationship status: Not on file  . Intimate partner violence:      Fear of current or ex partner: Not on file    Emotionally abused: Not on file    Physically abused: Not on file    Forced sexual activity: Not on file  Other Topics Concern  . Not on file  Social History Narrative  . Not on file    Family History:    Family History  Problem Relation Age of Onset  . Heart attack Father   . Heart attack Brother      ROS:  Please see the history of present illness.  General:no colds or fevers, no weight changes Skin:no rashes or ulcers HEENT:no blurred vision, no congestion CV:see HPI PUL:see HPI GI:no diarrhea constipation or melena, no indigestion- has a hx of abd pain.   GU:no hematuria, no dysuria MS:no joint pain, no claudication Neuro:no syncope, no lightheadedness Endo:no diabetes, no thyroid disease Psych:  Recent overdose percocet, purposeful.   All other ROS reviewed and negative.     Physical Exam/Data:   Vitals:   06/03/18 0030 06/03/18 0135 06/03/18 0511 06/03/18 0737  BP: (!) 162/83 (!) 157/102 (!) 163/96 (!) 151/87  Pulse: (!) 106 (!) 107 96 96  Resp: 19 (!) 22 18   Temp:  99 F (37.2 C) 98.8 F (37.1 C)   TempSrc:  Oral Oral   SpO2: 97% 95% 98%   Weight:  152 lb 5.4 oz (69.1 kg)    Height:  5\' 1"  (1.549 m)      Intake/Output Summary (Last 24 hours) at 06/03/2018 1101 Last data filed at 06/03/2018 1000 Gross per 24 hour  Intake 371.18 ml  Output 1100 ml  Net -728.82 ml   Filed Weights   06/02/18 2055 06/03/18 0135  Weight: 155 lb (70.3 kg) 152 lb 5.4 oz (69.1 kg)   Body mass index is 28.78 kg/m.  General:  Well nourished, well developed, in no acute  distress- just had foley cath placed.   HEENT: normal Lymph: no adenopathy Neck: no JVD Endocrine:  No thryomegaly Vascular: No carotid bruits; pedal pulses 2+ bilaterally Cardiac:  normal S1, S2; RRR; soft murmur no gallup rub or click Lungs:  clear to auscultation bilaterally, no wheezing, rhonchi or rales  Abd: soft, mild epigastric tenderness, no  hepatomegaly  Ext: no edema Musculoskeletal:  No deformities, BUE and BLE strength normal and equal Skin: warm and dry  Neuro:  Alert and oriented X 3 MAE follows commands, no focal abnormalities noted Psych:  Normal affect    Relevant CV Studies: Echo pending  Laboratory Data:  Chemistry Recent Labs  Lab 06/02/18 2053 06/03/18 0606  NA 132* 135  K 4.3 4.0  CL 94* 97*  CO2 24 26  GLUCOSE 162* 139*  BUN 17 14  CREATININE 0.80 0.75  CALCIUM 9.2 8.8*  GFRNONAA >60 >60  GFRAA >60 >60  ANIONGAP 14 12    Recent Labs  Lab 06/02/18 2234  PROT 7.0  ALBUMIN 3.6  AST 23  ALT 14  ALKPHOS 78  BILITOT 0.7   Hematology Recent Labs  Lab 06/02/18 2053 06/03/18 0606  WBC 16.5* 18.0*  RBC 4.04 3.99  HGB 12.0 11.7*  HCT 36.4 35.7*  MCV 90.1 89.5  MCH 29.7 29.3  MCHC 33.0 32.8  RDW 12.5 12.5  PLT 432* 381   Cardiac Enzymes Recent Labs  Lab 06/03/18 0254 06/03/18 0606  TROPONINI 0.05* 0.07*    Recent Labs  Lab 06/02/18 2108  TROPIPOC 0.06    BNP Recent Labs  Lab 06/03/18 0254  BNP 567.9*    DDimer No results for input(s): DDIMER in the last 168 hours.  Radiology/Studies:  Dg Chest 2 View  Result Date: 06/02/2018 CLINICAL DATA:  Chest pain and vomiting. EXAM: CHEST - 2 VIEW COMPARISON:  03/19/2018 and chest CT 08/05/2015 FINDINGS: Lungs are somewhat hypoinflated demonstrate mild opacification over the left base which may be due to small amount of pleural fluid/atelectasis. Mild prominence of the perihilar markings suggesting mild degree of vascular congestion. Mild stable cardiomegaly. Stable elevation of the left hemidiaphragm with large hiatal hernia unchanged. Remainder of the exam is unchanged. IMPRESSION: Mild cardiomegaly with suggestion of mild vascular congestion. Mild left base opacification likely small effusion with atelectasis. Large hiatal hernia unchanged. Electronically Signed   By: Marin Olp M.D.   On: 06/02/2018 21:42   Ct Abdomen Pelvis W  Contrast  Result Date: 06/02/2018 CLINICAL DATA:  84 year old female with acute abdominal pain and vomiting today. EXAM: CT ABDOMEN AND PELVIS WITH CONTRAST TECHNIQUE: Multidetector CT imaging of the abdomen and pelvis was performed using the standard protocol following bolus administration of intravenous contrast. CONTRAST:  172mL OMNIPAQUE IOHEXOL 300 MG/ML  SOLN COMPARISON:  05/20/2016 and prior CTs FINDINGS: Lower chest: LEFT basilar atelectasis noted. Hepatobiliary: The liver and gallbladder are unremarkable. Pancreas: Unremarkable Spleen: Unremarkable Adrenals/Urinary Tract: Moderate to marked distention of bladder again noted. Kidneys and adrenal glands are unremarkable. Stomach/Bowel: A large hiatal hernia is noted with a distended stomach in the LOWER LEFT chest/UPPER abdomen-but unchanged in appearance and configuration since 08/05/2015 No dilated small bowel loops identified. No colonic abnormalities are identified. Vascular/Lymphatic: Aortic atherosclerosis. No enlarged abdominal or pelvic lymph nodes. Reproductive: Uterus and bilateral adnexa are unremarkable. Other: No ascites, pneumoperitoneum or abscess. A moderate paraumbilical hernia containing fat is present. Musculoskeletal: No acute abnormalities identified. Lumbar scoliosis and moderate to severe multilevel degenerative changes again noted. LEFT hip arthroplasty again  identified. IMPRESSION: 1. Large hiatal hernia with distended stomach in the LOWER LEFT chest/UPPER abdomen with unchanged appearance and configuration of the stomach when compared to 08/05/2015. 2. Distended bladder. 3. Moderate paraumbilical hernia containing fat 4.  Aortic Atherosclerosis (ICD10-I70.0). Electronically Signed   By: Margarette Canada M.D.   On: 06/02/2018 23:28    Assessment and Plan:   1. Chest pain in combination with abd pain and N&V.  troponins mildly elevated.  She does have large hiatal hernia and distended bladder with >800 ml of urine.  Echo pending.   Mostly likely demand ischemia with acute SIRS.  Dr. Harrell Gave to see.  Currently pain free. 2. Lactic acid and WBC elevated.  Temp pk 99. U/A clear with trace of leukocytes.  3. HTN elevated, oral meds on hold, has IV hydralazine PRN. 4. GERD IV pepcid ordered.  5. Pt is DNR and allergic to ASA.    For questions or updates, please contact [place] Please consult [email or web address] for contact info under Cardiology/STEMI.   Signed, Cecilie Kicks, NP  06/03/2018 11:01 AM

## 2018-06-03 NOTE — Progress Notes (Addendum)
CRITICAL VALUE STICKER  CRITICAL VALUE: Lactic Acid 2.1  RECEIVER (on-site recipient of call): Octavio Graves, [place] NOTIFIED: 06/03/2018 0741  MD NOTIFIED: Elgergawy  TIME OF NOTIFICATION: 7354  RESPONSE:

## 2018-06-03 NOTE — Progress Notes (Signed)
Pt's still vomiting after Zofran. NP on call was notified. Patient has both active orders for Foley or In and Out Cath. NP on call was notified to clarify the orders. Will continue to monitor.

## 2018-06-03 NOTE — Progress Notes (Signed)
Order obtained for Foley after pt bladder scanned for >858mL urine with no urge to void. Pt given pericare - labia noted to be reddened. Sterile technique used, required two attempts. Peri area recleansed with iodine provided for each attempt. Balloon inflated to 10cc, return of clear, pale yellow urine noted. Sample sent. Catheter secured with included Statlock device on left leg. Bag dated, pt educated on Foley and care.  Pt more comfortable since administration of IV Reglan. Will continue to monitor.

## 2018-06-04 ENCOUNTER — Observation Stay (HOSPITAL_COMMUNITY): Payer: Medicare Other

## 2018-06-04 DIAGNOSIS — E785 Hyperlipidemia, unspecified: Secondary | ICD-10-CM | POA: Diagnosis present

## 2018-06-04 DIAGNOSIS — I110 Hypertensive heart disease with heart failure: Secondary | ICD-10-CM | POA: Diagnosis not present

## 2018-06-04 DIAGNOSIS — R1110 Vomiting, unspecified: Secondary | ICD-10-CM | POA: Diagnosis not present

## 2018-06-04 DIAGNOSIS — Z7401 Bed confinement status: Secondary | ICD-10-CM | POA: Diagnosis not present

## 2018-06-04 DIAGNOSIS — M6281 Muscle weakness (generalized): Secondary | ICD-10-CM | POA: Diagnosis not present

## 2018-06-04 DIAGNOSIS — R278 Other lack of coordination: Secondary | ICD-10-CM | POA: Diagnosis not present

## 2018-06-04 DIAGNOSIS — E860 Dehydration: Secondary | ICD-10-CM | POA: Diagnosis not present

## 2018-06-04 DIAGNOSIS — M455 Ankylosing spondylitis of thoracolumbar region: Secondary | ICD-10-CM | POA: Diagnosis not present

## 2018-06-04 DIAGNOSIS — N390 Urinary tract infection, site not specified: Secondary | ICD-10-CM | POA: Diagnosis not present

## 2018-06-04 DIAGNOSIS — Z931 Gastrostomy status: Secondary | ICD-10-CM | POA: Diagnosis not present

## 2018-06-04 DIAGNOSIS — I5032 Chronic diastolic (congestive) heart failure: Secondary | ICD-10-CM | POA: Diagnosis not present

## 2018-06-04 DIAGNOSIS — Z48815 Encounter for surgical aftercare following surgery on the digestive system: Secondary | ICD-10-CM | POA: Diagnosis not present

## 2018-06-04 DIAGNOSIS — R338 Other retention of urine: Secondary | ICD-10-CM | POA: Diagnosis not present

## 2018-06-04 DIAGNOSIS — A4151 Sepsis due to Escherichia coli [E. coli]: Secondary | ICD-10-CM | POA: Diagnosis not present

## 2018-06-04 DIAGNOSIS — K449 Diaphragmatic hernia without obstruction or gangrene: Secondary | ICD-10-CM

## 2018-06-04 DIAGNOSIS — R7989 Other specified abnormal findings of blood chemistry: Secondary | ICD-10-CM

## 2018-06-04 DIAGNOSIS — Z79891 Long term (current) use of opiate analgesic: Secondary | ICD-10-CM | POA: Diagnosis not present

## 2018-06-04 DIAGNOSIS — R1311 Dysphagia, oral phase: Secondary | ICD-10-CM | POA: Diagnosis not present

## 2018-06-04 DIAGNOSIS — K219 Gastro-esophageal reflux disease without esophagitis: Secondary | ICD-10-CM | POA: Diagnosis not present

## 2018-06-04 DIAGNOSIS — E440 Moderate protein-calorie malnutrition: Secondary | ICD-10-CM | POA: Diagnosis not present

## 2018-06-04 DIAGNOSIS — R1013 Epigastric pain: Secondary | ICD-10-CM | POA: Diagnosis not present

## 2018-06-04 DIAGNOSIS — I272 Pulmonary hypertension, unspecified: Secondary | ICD-10-CM | POA: Diagnosis present

## 2018-06-04 DIAGNOSIS — R262 Difficulty in walking, not elsewhere classified: Secondary | ICD-10-CM | POA: Diagnosis not present

## 2018-06-04 DIAGNOSIS — E46 Unspecified protein-calorie malnutrition: Secondary | ICD-10-CM | POA: Diagnosis not present

## 2018-06-04 DIAGNOSIS — G4733 Obstructive sleep apnea (adult) (pediatric): Secondary | ICD-10-CM | POA: Diagnosis not present

## 2018-06-04 DIAGNOSIS — K567 Ileus, unspecified: Secondary | ICD-10-CM | POA: Diagnosis not present

## 2018-06-04 DIAGNOSIS — Z993 Dependence on wheelchair: Secondary | ICD-10-CM | POA: Diagnosis not present

## 2018-06-04 DIAGNOSIS — I248 Other forms of acute ischemic heart disease: Secondary | ICD-10-CM

## 2018-06-04 DIAGNOSIS — E876 Hypokalemia: Secondary | ICD-10-CM | POA: Diagnosis present

## 2018-06-04 DIAGNOSIS — H3530 Unspecified macular degeneration: Secondary | ICD-10-CM | POA: Diagnosis not present

## 2018-06-04 DIAGNOSIS — Z96611 Presence of right artificial shoulder joint: Secondary | ICD-10-CM | POA: Diagnosis present

## 2018-06-04 DIAGNOSIS — E162 Hypoglycemia, unspecified: Secondary | ICD-10-CM | POA: Diagnosis not present

## 2018-06-04 DIAGNOSIS — K5909 Other constipation: Secondary | ICD-10-CM | POA: Diagnosis present

## 2018-06-04 DIAGNOSIS — Z431 Encounter for attention to gastrostomy: Secondary | ICD-10-CM | POA: Diagnosis not present

## 2018-06-04 DIAGNOSIS — M2550 Pain in unspecified joint: Secondary | ICD-10-CM | POA: Diagnosis not present

## 2018-06-04 DIAGNOSIS — R41841 Cognitive communication deficit: Secondary | ICD-10-CM | POA: Diagnosis not present

## 2018-06-04 DIAGNOSIS — R1010 Upper abdominal pain, unspecified: Secondary | ICD-10-CM | POA: Diagnosis not present

## 2018-06-04 DIAGNOSIS — Z66 Do not resuscitate: Secondary | ICD-10-CM | POA: Diagnosis present

## 2018-06-04 DIAGNOSIS — I472 Ventricular tachycardia: Secondary | ICD-10-CM | POA: Diagnosis not present

## 2018-06-04 DIAGNOSIS — K429 Umbilical hernia without obstruction or gangrene: Secondary | ICD-10-CM | POA: Diagnosis present

## 2018-06-04 DIAGNOSIS — I700 Atherosclerosis of aorta: Secondary | ICD-10-CM | POA: Diagnosis not present

## 2018-06-04 DIAGNOSIS — K920 Hematemesis: Secondary | ICD-10-CM | POA: Diagnosis not present

## 2018-06-04 DIAGNOSIS — Z96653 Presence of artificial knee joint, bilateral: Secondary | ICD-10-CM | POA: Diagnosis present

## 2018-06-04 DIAGNOSIS — R6510 Systemic inflammatory response syndrome (SIRS) of non-infectious origin without acute organ dysfunction: Secondary | ICD-10-CM | POA: Diagnosis not present

## 2018-06-04 DIAGNOSIS — Z4682 Encounter for fitting and adjustment of non-vascular catheter: Secondary | ICD-10-CM | POA: Diagnosis not present

## 2018-06-04 DIAGNOSIS — M810 Age-related osteoporosis without current pathological fracture: Secondary | ICD-10-CM | POA: Diagnosis not present

## 2018-06-04 LAB — BLOOD CULTURE ID PANEL (REFLEXED)
ACINETOBACTER BAUMANNII: NOT DETECTED
CANDIDA ALBICANS: NOT DETECTED
CANDIDA GLABRATA: NOT DETECTED
CANDIDA KRUSEI: NOT DETECTED
CANDIDA PARAPSILOSIS: NOT DETECTED
Candida tropicalis: NOT DETECTED
Enterobacter cloacae complex: NOT DETECTED
Enterobacteriaceae species: NOT DETECTED
Enterococcus species: NOT DETECTED
Escherichia coli: NOT DETECTED
Haemophilus influenzae: NOT DETECTED
Klebsiella oxytoca: NOT DETECTED
Klebsiella pneumoniae: NOT DETECTED
Listeria monocytogenes: NOT DETECTED
Methicillin resistance: NOT DETECTED
Neisseria meningitidis: NOT DETECTED
PSEUDOMONAS AERUGINOSA: NOT DETECTED
Proteus species: NOT DETECTED
SERRATIA MARCESCENS: NOT DETECTED
STAPHYLOCOCCUS AUREUS BCID: NOT DETECTED
STREPTOCOCCUS PNEUMONIAE: NOT DETECTED
STREPTOCOCCUS PYOGENES: NOT DETECTED
Staphylococcus species: DETECTED — AB
Streptococcus agalactiae: NOT DETECTED
Streptococcus species: NOT DETECTED

## 2018-06-04 LAB — BASIC METABOLIC PANEL WITH GFR
Anion gap: 13 (ref 5–15)
BUN: 15 mg/dL (ref 8–23)
CO2: 26 mmol/L (ref 22–32)
Calcium: 9.2 mg/dL (ref 8.9–10.3)
Chloride: 98 mmol/L (ref 98–111)
Creatinine, Ser: 0.8 mg/dL (ref 0.44–1.00)
GFR calc Af Amer: >60 mL/min
GFR calc non Af Amer: >60 mL/min
Glucose, Bld: 138 mg/dL — ABNORMAL HIGH (ref 70–99)
Potassium: 3.4 mmol/L — ABNORMAL LOW (ref 3.5–5.1)
Sodium: 137 mmol/L (ref 135–145)

## 2018-06-04 LAB — CBC
HCT: 36.8 % (ref 36.0–46.0)
HCT: 34.8 % — ABNORMAL LOW (ref 36.0–46.0)
Hemoglobin: 12.2 g/dL (ref 12.0–15.0)
Hemoglobin: 11.5 g/dL — ABNORMAL LOW (ref 12.0–15.0)
MCH: 29.8 pg (ref 26.0–34.0)
MCH: 29.6 pg (ref 26.0–34.0)
MCHC: 33.2 g/dL (ref 30.0–36.0)
MCHC: 33 g/dL (ref 30.0–36.0)
MCV: 89.8 fL (ref 78.0–100.0)
MCV: 89.7 fL (ref 78.0–100.0)
PLATELETS: 370 10*3/uL (ref 150–400)
PLATELETS: 352 10*3/uL (ref 150–400)
RBC: 4.1 MIL/uL (ref 3.87–5.11)
RBC: 3.88 MIL/uL (ref 3.87–5.11)
RDW: 12.8 % (ref 11.5–15.5)
RDW: 12.7 % (ref 11.5–15.5)
WBC: 16 10*3/uL — AB (ref 4.0–10.5)
WBC: 17.8 10*3/uL — ABNORMAL HIGH (ref 4.0–10.5)

## 2018-06-04 LAB — TYPE AND SCREEN
ABO/RH(D): O POS
Antibody Screen: NEGATIVE

## 2018-06-04 LAB — APTT: APTT: 29 s (ref 24–36)

## 2018-06-04 LAB — PROTIME-INR
INR: 1.21
Prothrombin Time: 15.2 seconds (ref 11.4–15.2)

## 2018-06-04 MED ORDER — PROMETHAZINE HCL 25 MG/ML IJ SOLN
12.5000 mg | Freq: Once | INTRAMUSCULAR | Status: AC
Start: 2018-06-04 — End: 2018-06-04
  Administered 2018-06-04: 12.5 mg via INTRAVENOUS
  Filled 2018-06-04: qty 1

## 2018-06-04 MED ORDER — PROCHLORPERAZINE EDISYLATE 10 MG/2ML IJ SOLN
10.0000 mg | Freq: Once | INTRAMUSCULAR | Status: AC
  Administered 2018-06-04: 10 mg via INTRAVENOUS
  Filled 2018-06-04: qty 2

## 2018-06-04 MED ORDER — SODIUM CHLORIDE 0.9 % IV SOLN
80.0000 mg | Freq: Once | INTRAVENOUS | Status: AC
  Administered 2018-06-04: 80 mg via INTRAVENOUS
  Filled 2018-06-04: qty 80

## 2018-06-04 MED ORDER — SODIUM CHLORIDE 0.9 % IV SOLN
8.0000 mg/h | INTRAVENOUS | Status: DC
  Administered 2018-06-04 (×2): 8 mg/h via INTRAVENOUS
  Filled 2018-06-04 (×3): qty 80

## 2018-06-04 MED ORDER — PANTOPRAZOLE SODIUM 40 MG IV SOLR: 40.0000 mg | Freq: Two times a day (BID) | INTRAVENOUS | Status: DC

## 2018-06-04 NOTE — Progress Notes (Signed)
Pt vomited after Reglan was given. RN administered PRN Zofran. No improvement pt vomited x 2 afterwards. MD notified.

## 2018-06-04 NOTE — Consult Note (Signed)
Referring Provider:  [place] Primary Care Physician:  Deland Pretty, MD Primary Gastroenterologist:  unassigned  Reason for Consultation:  Coffee-ground emesis, large hiatal hernia  HPI: [name] is a 84 y.o. female with multiple medical co morbidities as mentioned below presented to the hospital with chest pain, abdominal pain, nausea and vomiting. She was found to have a leukocytosis upon initial evaluation.  CT abdomen pelvis showed very large hiatal hernia.started having coffee-ground emesis this morning. GI is consulted for further evaluation.  Patient seen and examined at bedside. She is in mild distress from NG suction she started noticing worsening nausea vomiting yesterday followed by abdominal pain. Abdominal pain was located in epigastric area which is improving now. . She denies any black tarry stool.denied any NSAID use. Complaining of chest pain but denied any increasing shortness of breath.   NG suction in place draining coffee-ground liquid material.  Past Medical History:  Diagnosis Date  . Actinic keratosis   . Allergic rhinitis   . Back pain   . Chronic constipation   . Chronic diastolic CHF (congestive heart failure) ([place]) 06/03/2018  . GERD (gastroesophageal reflux disease)   . Hyperlipidemia   . Hypertension   . Hyponatremia   . Leg edema   . Macular degeneration   . Mitral regurgitation    mild by echo 2003  . Osteoarthritis   . Osteoarthrosis   . Osteoporosis   . Tenosynovitis    myxomatous    Past Surgical History:  Procedure Laterality Date  . CARPAL TUNNEL RELEASE     bilateral  . hernial repain    . INGUINAL HERNIA REPAIR     laparoscopic preperitoneal repair of bilateral inguinal hernias  . REPLACEMENT TOTAL KNEE BILATERAL    . REVERSE SHOULDER ARTHROPLASTY Right 05/08/2014   Procedure: REVERSE SHOULDER ARTHROPLASTY RIGHT ;  Surgeon: Marin Shutter, MD;  Location: [facility];  Service: Orthopedics;  Laterality: Right;  . SYNOVECTOMY     of right  long finger  . tonisellectomy    . tonisillectomy    . TONSILLECTOMY      Prior to Admission medications   Medication Sig Start Date End Date Taking? Authorizing Provider  Calcium Carbonate-Vitamin D3 (CALCIUM 600+D3) 600-400 MG-UNIT TABS Take 1 tablet by mouth daily.   Yes [provider]  cetirizine (ZYRTEC) 10 MG tablet Take 10 mg by mouth daily.    Yes [provider]  docusate sodium (COLACE) 100 MG capsule Take 100 mg by mouth 2 (two) times daily.   Yes [provider]  estradiol (ESTRACE) 0.1 MG/GM vaginal cream Place 1 Applicatorful vaginally 2 (two) times a week. Mondays and Thursdays   Yes [provider]  gabapentin (NEURONTIN) 100 MG capsule Take 100 mg by mouth at bedtime.   Yes [provider]  irbesartan (AVAPRO) 75 MG tablet Take 1 tablet (75 mg total) by mouth daily. 05/10/14  Yes Tat, Shanon Brow, MD  Multiple Vitamins-Minerals (CENTRUM SILVER ULTRA WOMENS PO) Take 1 tablet by mouth daily.   Yes [provider]  Multiple Vitamins-Minerals (PRESERVISION AREDS 2 PO) Take 2 tablets by mouth daily.    Yes [provider]  omeprazole (PRILOSEC) 20 MG capsule Take 20 mg by mouth daily.   Yes [provider]  ondansetron (ZOFRAN) 4 MG tablet Take 4 mg by mouth every 6 (six) hours as needed for nausea or vomiting.   Yes [provider]  oxyCODONE (OXY IR/ROXICODONE) 5 MG immediate release tablet Take 5 mg by  mouth every 8 (eight) hours as needed for severe pain.   Yes [provider]  Probiotic Product (PROBIOTIC DAILY) CAPS Take 250 mg by mouth daily.    Yes [provider]  spironolactone (ALDACTONE) 25 MG tablet Take 25 mg by mouth daily.    Yes [provider]  trimethoprim (TRIMPEX) 100 MG tablet Take 100 mg by mouth daily. UTI prophylaxis.   Yes [provider]  pseudoephedrine-guaifenesin (MUCINEX D) 60-600 MG 12 hr tablet Take 1 tablet by mouth every 12 (twelve) hours.  06/02/18 06/05/18  [provider]    Scheduled Meds: . metoCLOPramide (REGLAN) injection  5 mg Intravenous Q8H  . [START ON 06/07/2018] pantoprazole  40 mg Intravenous Q12H   Continuous Infusions: . sodium chloride 50 mL/hr at 06/03/18 0447  . cefTRIAXone (ROCEPHIN)  IV 1 g (06/03/18 1117)  . famotidine (PEPCID) IV 20 mg (06/03/18 2111)  . pantoprazole (PROTONIX) IVPB    . pantoprozole (PROTONIX) infusion     PRN Meds:.acetaminophen, fentaNYL (SUBLIMAZE) injection, hydrALAZINE, nitroGLYCERIN, ondansetron (ZOFRAN) IV  Allergies as of 06/02/2018 - Review Complete 06/02/2018  Allergen Reaction Noted  . Aspirin Other (See Comments) 01/15/2010  . Boniva [ibandronic acid] Other (See Comments) 06/09/2012  . Morphine Other (See Comments) 01/15/2010  . Nitrofuran derivatives Other (See Comments) 06/09/2012  . Penicillins Other (See Comments) 01/15/2010  . Sulfonamide derivatives Other (See Comments) 01/15/2010    Family History  Problem Relation Age of Onset  . Heart attack Father   . Heart attack Brother     Social History   Socioeconomic History  . Marital status: Divorced    Spouse name: Not on file  . Number of children: 4  . Years of education: Not on file  . Highest education level: Not on file  Occupational History  . Not on file  Social Needs  . Financial resource strain: Not on file  . Food insecurity:    Worry: Not on file    Inability: Not on file  . Transportation needs:    Medical: Not on file    Non-medical: Not on file  Tobacco Use  . Smoking status: Former Smoker    Packs/day: 1.00    Years: 2.00    Pack years: 2.00    Types: Cigarettes    Last attempt to quit: 12/07/1967    Years since quitting: 50.5  . Smokeless tobacco: Never Used  Substance and Sexual Activity  . Alcohol use: Yes    Alcohol/week: 1.2 oz    Types: 2 Glasses of wine per week    Comment: 2 glasses per week  . Drug use: No  . Sexual activity: Never  Lifestyle  . Physical  activity:    Days per week: Not on file    Minutes per session: Not on file  . Stress: Not on file  Relationships  . Social connections:    Talks on phone: Not on file    Gets together: Not on file    Attends religious service: Not on file    Active member of club or organization: Not on file    Attends meetings of clubs or organizations: Not on file    Relationship status: Not on file  . Intimate partner violence:    Fear of current or ex partner: Not on file    Emotionally abused: Not on file    Physically abused: Not on file    Forced sexual activity: Not on file  Other Topics Concern  .  Not on file  Social History Narrative  . Not on file    Review of Systems: Review of Systems  Constitutional: Negative for chills and fever.  HENT: Negative for hearing loss and tinnitus.   Eyes: Negative for blurred vision and double vision.  Respiratory: Negative for cough and hemoptysis.   Cardiovascular: Positive for chest pain. Negative for palpitations and orthopnea.  Gastrointestinal: Positive for abdominal pain, heartburn, nausea and vomiting. Negative for blood in stool and melena.  Genitourinary: Negative for dysuria and urgency.  Musculoskeletal: Positive for back pain and joint pain.  Skin: Negative for rash.  Neurological: Negative for seizures and loss of consciousness.  Endo/Heme/Allergies: Does not bruise/bleed easily.  Psychiatric/Behavioral: Negative for hallucinations and suicidal ideas.    Physical Exam: Vital signs: Vitals:   06/03/18 2059 06/04/18 0516  BP: (!) 158/95 (!) 150/87  Pulse: 95 97  Resp: 18 18  Temp: 98.5 F (36.9 C) (!) 97.4 F (36.3 C)  SpO2: 95% 97%   Last BM Date: 06/01/18 Physical Exam  Constitutional: She is oriented to person, place, and time. She appears well-developed and well-nourished. She appears distressed.  HENT:  NG tube in place.   Eyes: EOM are normal. No scleral icterus.  Neck: Normal range of motion. Neck supple.   Cardiovascular: Normal rate and regular rhythm.  Murmur heard. Pulmonary/Chest: No respiratory distress.  Basilar  crackles.  Abdominal: Soft. Bowel sounds are normal. She exhibits no distension. There is tenderness. There is no rebound and no guarding.  Epigastric tenderness to palpation  Musculoskeletal: Normal range of motion. She exhibits no edema.  Neurological: She is alert and oriented to person, place, and time.  Skin: Skin is warm. No erythema.  Psychiatric: She has a normal mood and affect. Judgment normal.  Vitals reviewed.   GI:  Lab Results: Recent Labs    06/02/18 2053 06/03/18 0606 06/04/18 0640  WBC 16.5* 18.0* 16.0*  HGB 12.0 11.7* 12.2  HCT 36.4 35.7* 36.8  PLT 432* 381 370   BMET Recent Labs    06/02/18 2053 06/03/18 0606 06/04/18 0640  NA 132* 135 137  K 4.3 4.0 3.4*  CL 94* 97* 98  CO2 24 26 26   GLUCOSE 162* 139* 138*  BUN 17 14 15   CREATININE 0.80 0.75 0.80  CALCIUM 9.2 8.8* 9.2   LFT Recent Labs    06/02/18 2234  PROT 7.0  ALBUMIN 3.6  AST 23  ALT 14  ALKPHOS 78  BILITOT 0.7  BILIDIR <0.1  IBILI NOT CALCULATED   PT/INR No results for input(s): LABPROT, INR in the last 72 hours.   Studies/Results: Dg Chest 2 View  Result Date: 06/02/2018 CLINICAL DATA:  Chest pain and vomiting. EXAM: CHEST - 2 VIEW COMPARISON:  03/19/2018 and chest CT 08/05/2015 FINDINGS: Lungs are somewhat hypoinflated demonstrate mild opacification over the left base which may be due to small amount of pleural fluid/atelectasis. Mild prominence of the perihilar markings suggesting mild degree of vascular congestion. Mild stable cardiomegaly. Stable elevation of the left hemidiaphragm with large hiatal hernia unchanged. Remainder of the exam is unchanged. IMPRESSION: Mild cardiomegaly with suggestion of mild vascular congestion. Mild left base opacification likely small effusion with atelectasis. Large hiatal hernia unchanged. Electronically Signed   By: Marin Olp M.D.   On: 06/02/2018 21:42   Ct Abdomen Pelvis W Contrast  Result Date: 06/02/2018 CLINICAL DATA:  84 year old female with acute abdominal pain and vomiting today. EXAM: CT ABDOMEN AND PELVIS WITH CONTRAST TECHNIQUE: Multidetector  CT imaging of the abdomen and pelvis was performed using the standard protocol following bolus administration of intravenous contrast. CONTRAST:  159mL OMNIPAQUE IOHEXOL 300 MG/ML  SOLN COMPARISON:  05/20/2016 and prior CTs FINDINGS: Lower chest: LEFT basilar atelectasis noted. Hepatobiliary: The liver and gallbladder are unremarkable. Pancreas: Unremarkable Spleen: Unremarkable Adrenals/Urinary Tract: Moderate to marked distention of bladder again noted. Kidneys and adrenal glands are unremarkable. Stomach/Bowel: A large hiatal hernia is noted with a distended stomach in the LOWER LEFT chest/UPPER abdomen-but unchanged in appearance and configuration since 08/05/2015 No dilated small bowel loops identified. No colonic abnormalities are identified. Vascular/Lymphatic: Aortic atherosclerosis. No enlarged abdominal or pelvic lymph nodes. Reproductive: Uterus and bilateral adnexa are unremarkable. Other: No ascites, pneumoperitoneum or abscess. A moderate paraumbilical hernia containing fat is present. Musculoskeletal: No acute abnormalities identified. Lumbar scoliosis and moderate to severe multilevel degenerative changes again noted. LEFT hip arthroplasty again identified. IMPRESSION: 1. Large hiatal hernia with distended stomach in the LOWER LEFT chest/UPPER abdomen with unchanged appearance and configuration of the stomach when compared to 08/05/2015. 2. Distended bladder. 3. Moderate paraumbilical hernia containing fat 4.  Aortic Atherosclerosis (ICD10-I70.0). Electronically Signed   By: Margarette Canada M.D.   On: 06/02/2018 23:28    Impression/Plan: - nausea, vomiting, coffee-ground emesis and epigastric pain in setting of very large hiatal hernia. - Large hiatal hernia -  Elevated troponins. No acute cardiac events. Most likely from underlying medical issues.  Recommendations --------------------------- - Continue NG suction. - Continue PPI - Plan for EGD tomorrow. - Appreciate cardiology input. - Nothing by mouth for now - recommended surgical consultation  Risks (bleeding, infection, bowel perforation that could require surgery, sedation-related changes in cardiopulmonary systems), benefits (identification and possible treatment of source of symptoms, exclusion of certain causes of symptoms), and alternatives (watchful waiting, radiographic imaging studies, empiric medical treatment)  were explained to patient in detail and patient wishes to proceed.   LOS: 0 days   Otis Brace  MD, [place] 06/04/2018, 10:13 AM  Contact #  [phone]

## 2018-06-04 NOTE — Progress Notes (Signed)
Progress Note  Patient Name: [name] Date of Encounter: 06/04/2018  Primary Cardiologist: Lauree Chandler, MD   Subjective   Still feels poorly. Felt "fluttering" in epigastrium after eating today. No chest pain. N/V persist.  Inpatient Medications    Scheduled Meds: . metoCLOPramide (REGLAN) injection  5 mg Intravenous Q8H  . [START ON 06/07/2018] pantoprazole  40 mg Intravenous Q12H   Continuous Infusions: . sodium chloride 50 mL/hr at 06/03/18 0447  . cefTRIAXone (ROCEPHIN)  IV 1 g (06/03/18 1117)  . famotidine (PEPCID) IV 20 mg (06/03/18 2111)  . pantoprazole (PROTONIX) IVPB    . pantoprozole (PROTONIX) infusion     PRN Meds: acetaminophen, fentaNYL (SUBLIMAZE) injection, hydrALAZINE, nitroGLYCERIN, ondansetron (ZOFRAN) IV   Vital Signs    Vitals:   06/03/18 0737 06/03/18 1437 06/03/18 2059 06/04/18 0516  BP: (!) 151/87 (!) 156/91 (!) 158/95 (!) 150/87  Pulse: 96 100 95 97  Resp:  18 18 18   Temp:  98.9 F (37.2 C) 98.5 F (36.9 C) (!) 97.4 F (36.3 C)  TempSrc:   Oral Oral  SpO2:  96% 95% 97%  Weight:      Height:        Intake/Output Summary (Last 24 hours) at 06/04/2018 1018 Last data filed at 06/04/2018 0200 Gross per 24 hour  Intake 2156.74 ml  Output -  Net 2156.74 ml   Filed Weights   06/02/18 2055 06/03/18 0135  Weight: 155 lb (70.3 kg) 152 lb 5.4 oz (69.1 kg)    Telemetry    NSR, sinus tach with occ PVCs - Personally Reviewed  ECG    NSR incomplete RBBB. LAFB. No acute changes - Personally Reviewed  Physical Exam   GEN: No acute distress. Elderly appears chronically ill   Neck: No JVD Cardiac: RRR, no murmurs, rubs, or gallops.  Respiratory: Clear to auscultation bilaterally. GI: Soft, nontender, non-distended  MS: No edema; No deformity. Neuro:  Nonfocal  Psych: Normal affect   Labs    Chemistry Recent Labs  Lab 06/02/18 2053 06/02/18 2234 06/03/18 0606 06/04/18 0640  NA 132*  --  135 137  K 4.3  --  4.0 3.4*    CL 94*  --  97* 98  CO2 24  --  26 26  GLUCOSE 162*  --  139* 138*  BUN 17  --  14 15  CREATININE 0.80  --  0.75 0.80  CALCIUM 9.2  --  8.8* 9.2  PROT  --  7.0  --   --   ALBUMIN  --  3.6  --   --   AST  --  23  --   --   ALT  --  14  --   --   ALKPHOS  --  78  --   --   BILITOT  --  0.7  --   --   GFRNONAA >60  --  >60 >60  GFRAA >60  --  >60 >60  ANIONGAP 14  --  12 13     Hematology Recent Labs  Lab 06/02/18 2053 06/03/18 0606 06/04/18 0640  WBC 16.5* 18.0* 16.0*  RBC 4.04 3.99 4.10  HGB 12.0 11.7* 12.2  HCT 36.4 35.7* 36.8  MCV 90.1 89.5 89.8  MCH 29.7 29.3 29.8  MCHC 33.0 32.8 33.2  RDW 12.5 12.5 12.8  PLT 432* 381 370    Cardiac Enzymes Recent Labs  Lab 06/03/18 0254 06/03/18 0606 06/03/18 1204  TROPONINI 0.05* 0.07* 0.11*  Recent Labs  Lab 06/02/18 2108  TROPIPOC 0.06     BNP Recent Labs  Lab 06/03/18 0254  BNP 567.9*     DDimer No results for input(s): DDIMER in the last 168 hours.   Radiology    Dg Chest 2 View  Result Date: 06/02/2018 CLINICAL DATA:  Chest pain and vomiting. EXAM: CHEST - 2 VIEW COMPARISON:  03/19/2018 and chest CT 08/05/2015 FINDINGS: Lungs are somewhat hypoinflated demonstrate mild opacification over the left base which may be due to small amount of pleural fluid/atelectasis. Mild prominence of the perihilar markings suggesting mild degree of vascular congestion. Mild stable cardiomegaly. Stable elevation of the left hemidiaphragm with large hiatal hernia unchanged. Remainder of the exam is unchanged. IMPRESSION: Mild cardiomegaly with suggestion of mild vascular congestion. Mild left base opacification likely small effusion with atelectasis. Large hiatal hernia unchanged. Electronically Signed   By: Marin Olp M.D.   On: 06/02/2018 21:42   Ct Abdomen Pelvis W Contrast  Result Date: 06/02/2018 CLINICAL DATA:  84 year old female with acute abdominal pain and vomiting today. EXAM: CT ABDOMEN AND PELVIS WITH CONTRAST  TECHNIQUE: Multidetector CT imaging of the abdomen and pelvis was performed using the standard protocol following bolus administration of intravenous contrast. CONTRAST:  144mL OMNIPAQUE IOHEXOL 300 MG/ML  SOLN COMPARISON:  05/20/2016 and prior CTs FINDINGS: Lower chest: LEFT basilar atelectasis noted. Hepatobiliary: The liver and gallbladder are unremarkable. Pancreas: Unremarkable Spleen: Unremarkable Adrenals/Urinary Tract: Moderate to marked distention of bladder again noted. Kidneys and adrenal glands are unremarkable. Stomach/Bowel: A large hiatal hernia is noted with a distended stomach in the LOWER LEFT chest/UPPER abdomen-but unchanged in appearance and configuration since 08/05/2015 No dilated small bowel loops identified. No colonic abnormalities are identified. Vascular/Lymphatic: Aortic atherosclerosis. No enlarged abdominal or pelvic lymph nodes. Reproductive: Uterus and bilateral adnexa are unremarkable. Other: No ascites, pneumoperitoneum or abscess. A moderate paraumbilical hernia containing fat is present. Musculoskeletal: No acute abnormalities identified. Lumbar scoliosis and moderate to severe multilevel degenerative changes again noted. LEFT hip arthroplasty again identified. IMPRESSION: 1. Large hiatal hernia with distended stomach in the LOWER LEFT chest/UPPER abdomen with unchanged appearance and configuration of the stomach when compared to 08/05/2015. 2. Distended bladder. 3. Moderate paraumbilical hernia containing fat 4.  Aortic Atherosclerosis (ICD10-I70.0). Electronically Signed   By: Margarette Canada M.D.   On: 06/02/2018 23:28    Cardiac Studies   Echo: Study Conclusions  - Left ventricle: The cavity size was normal. Wall thickness was   normal. Systolic function was normal. The estimated ejection   fraction was in the range of 55% to 60%. Wall motion was normal;   there were no regional wall motion abnormalities. Doppler   parameters are consistent with abnormal left  ventricular   relaxation (grade 1 diastolic dysfunction). - Mitral valve: There was mild regurgitation. - Left atrium: The atrium was severely dilated. - Pulmonary arteries: Systolic pressure was mildly increased. PA   peak pressure: 44 mm Hg (S).  Impressions:  - Normal LV systolic function; mild diastolic dysfunction; mild MR;   severe LAE; mild TR with mild pulmonary hypertension.   Patient Profile     84 y.o. female with a hx of moderate LVH and EF 55-60%, G1DD in 04/2014, large HH,   who is being seen  for the evaluation of chest pain and elevated troponin    Assessment & Plan    1. Elevated troponin in setting of acute infection, N/V and lactic acidosis. This is all c/w demand ischemia.  Echo with normal LV function. No further cardiac work up planned. ASA allergic. Focus on treating underlying medical conditions. Nothing further to add from a cardiology standpoint.  [facility] will sign off.   Medication Recommendations:  none Other recommendations (labs, testing, etc):  None  Follow up as an outpatient:  PRN  For questions or updates, please contact [facility] Please consult [email or web address] for contact info under Cardiology/STEMI.      Signed, Reigna Ruperto [place], MD  06/04/2018, 10:18 AM

## 2018-06-04 NOTE — Consult Note (Signed)
Reason for Consult: hiatal hernia Referring Physician: Elgergawy  [name] is an 84 y.o. female.  HPI: 84 yo female presented with nausea and vomiting and epigastric pain. She has known she had a hiatal hernia for a long time. She had a similar hospitalization 3 years ago where the nausea/vomiting resolved quickly and she was discharged 2 days later. This has been vomiting >5 times a day for the last week. She has not been able to keep any foods down. Her family notes she had an intentional percocet overdose earlier in the year and has been in SNF since. She is able to feed herself but otherwise wheelchair bound, unable to dress herself. She is able to carry conversation and interact with people socially.  Past Medical History:  Diagnosis Date  . Actinic keratosis   . Allergic rhinitis   . Back pain   . Chronic constipation   . Chronic diastolic CHF (congestive heart failure) ([facility]) 06/03/2018  . GERD (gastroesophageal reflux disease)   . Hyperlipidemia   . Hypertension   . Hyponatremia   . Leg edema   . Macular degeneration   . Mitral regurgitation    mild by echo 2003  . Osteoarthritis   . Osteoarthrosis   . Osteoporosis   . Tenosynovitis    myxomatous    Past Surgical History:  Procedure Laterality Date  . CARPAL TUNNEL RELEASE     bilateral  . hernial repain    . INGUINAL HERNIA REPAIR     laparoscopic preperitoneal repair of bilateral inguinal hernias  . REPLACEMENT TOTAL KNEE BILATERAL    . REVERSE SHOULDER ARTHROPLASTY Right 05/08/2014   Procedure: REVERSE SHOULDER ARTHROPLASTY RIGHT ;  Surgeon: Marin Shutter, MD;  Location: [facility];  Service: Orthopedics;  Laterality: Right;  . SYNOVECTOMY     of right long finger  . tonisellectomy    . tonisillectomy    . TONSILLECTOMY      Family History  Problem Relation Age of Onset  . Heart attack Father   . Heart attack Brother     Social History:  reports that she quit smoking about 50 years ago. Her smoking use  included cigarettes. She has a 2.00 pack-year smoking history. She has never used smokeless tobacco. She reports that she drinks about 1.2 oz of alcohol per week. She reports that she does not use drugs.  Allergies:  Allergies  Allergen Reactions  . Aspirin Other (See Comments)    Unknown  . Boniva [Ibandronic Acid] Other (See Comments)    Unknown  . Morphine Other (See Comments)    Unknown  . Nitrofuran Derivatives Other (See Comments)    Unknown  . Penicillins Other (See Comments)    Received Ancef without problem  Has patient had a PCN reaction causing immediate rash, facial/tongue/throat swelling, SOB or lightheadedness with hypotension: Unknown Has patient had a PCN reaction causing severe rash involving mucus membranes or skin necrosis: Unknown Has patient had a PCN reaction that required hospitalization: Unknown Has patient had a PCN reaction occurring within the last 10 years: Unknown If all of the above answers are "NO", then may proceed with Cephalosporin use.   . Sulfonamide Derivatives Other (See Comments)    Unknown    Medications: I have reviewed the patient's current medications.  Results for orders placed or performed during the hospital encounter of 06/02/18 (from the past 48 hour(s))  Basic metabolic panel     Status: Abnormal   Collection Time: 06/02/18  8:53  PM  Result Value Ref Range   Sodium 132 (L) 135 - 145 mmol/L   Potassium 4.3 3.5 - 5.1 mmol/L   Chloride 94 (L) 98 - 111 mmol/L    Comment: Please note change in reference range.   CO2 24 22 - 32 mmol/L   Glucose, Bld 162 (H) 70 - 99 mg/dL    Comment: Please note change in reference range.   BUN 17 8 - 23 mg/dL    Comment: Please note change in reference range.   Creatinine, Ser 0.80 0.44 - 1.00 mg/dL   Calcium 9.2 8.9 - 10.3 mg/dL   GFR calc non Af Amer >60 >60 mL/min   GFR calc Af Amer >60 >60 mL/min    Comment: (NOTE) The eGFR has been calculated using the CKD EPI equation. This calculation has  not been validated in all clinical situations. eGFR's persistently <60 mL/min signify possible Chronic Kidney Disease.    Anion gap 14 5 - 15    Comment: Performed at [facility] [address] [place], [place] [address]  CBC     Status: Abnormal   Collection Time: 06/02/18  8:53 PM  Result Value Ref Range   WBC 16.5 (H) 4.0 - 10.5 K/uL   RBC 4.04 3.87 - 5.11 MIL/uL   Hemoglobin 12.0 12.0 - 15.0 g/dL   HCT 36.4 36.0 - 46.0 %   MCV 90.1 78.0 - 100.0 fL   MCH 29.7 26.0 - 34.0 pg   MCHC 33.0 30.0 - 36.0 g/dL   RDW 12.5 11.5 - 15.5 %   Platelets 432 (H) 150 - 400 K/uL    Comment: Performed at [facility] [address] [place], [place] [address]  Lipase, blood     Status: None   Collection Time: 06/02/18  8:53 PM  Result Value Ref Range   Lipase 22 11 - 51 U/L    Comment: Performed at [facility] [address], [place], [place] [address]  I-stat troponin, ED     Status: None   Collection Time: 06/02/18  9:08 PM  Result Value Ref Range   Troponin i, poc 0.06 0.00 - 0.08 ng/mL   Comment 3            Comment: Due to the release kinetics of cTnI, a negative result within the first hours of the onset of symptoms does not rule out myocardial infarction with certainty. If myocardial infarction is still suspected, repeat the test at appropriate intervals.   Hepatic function panel     Status: None   Collection Time: 06/02/18 10:34 PM  Result Value Ref Range   Total Protein 7.0 6.5 - 8.1 g/dL   Albumin 3.6 3.5 - 5.0 g/dL   AST 23 15 - 41 U/L   ALT 14 0 - 44 U/L    Comment: Please note change in reference range.   Alkaline Phosphatase 78 38 - 126 U/L   Total Bilirubin 0.7 0.3 - 1.2 mg/dL   Bilirubin, Direct <0.1 0.0 - 0.2 mg/dL    Comment: Please note change in reference range.   Indirect Bilirubin NOT CALCULATED 0.3 - 0.9 mg/dL    Comment: Performed at [facility], [place] [address], [place], [place] [address]  I-Stat CG4 Lactic Acid, ED     Status:  Abnormal   Collection Time: 06/02/18 10:43 PM  Result Value Ref Range   Lactic Acid, Venous 2.51 (HH) 0.5 - 1.9 mmol/L  Comment NOTIFIED PHYSICIAN   Urinalysis, Routine w reflex microscopic     Status: Abnormal   Collection Time: 06/02/18 11:52 PM  Result Value Ref Range   Color, Urine YELLOW YELLOW   APPearance CLEAR CLEAR   Specific Gravity, Urine 1.021 1.005 - 1.030   pH 7.0 5.0 - 8.0   Glucose, UA NEGATIVE NEGATIVE mg/dL   Hgb urine dipstick NEGATIVE NEGATIVE   Bilirubin Urine NEGATIVE NEGATIVE   Ketones, ur 20 (A) NEGATIVE mg/dL   Protein, ur NEGATIVE NEGATIVE mg/dL   Nitrite NEGATIVE NEGATIVE   Leukocytes, UA TRACE (A) NEGATIVE   RBC / HPF 0-5 0 - 5 RBC/hpf   WBC, UA 6-10 0 - 5 WBC/hpf   Bacteria, UA NONE SEEN NONE SEEN   Squamous Epithelial / LPF 0-5 0 - 5   Mucus PRESENT    Hyaline Casts, UA PRESENT     Comment: Performed at [facility], [address], [place], [place] [address]  Culture, blood (x 2)     Status: None (Preliminary result)   Collection Time: 06/03/18  2:30 AM  Result Value Ref Range   Specimen Description BLOOD RIGHT ARM    Special Requests      BOTTLES DRAWN AEROBIC AND ANAEROBIC Blood Culture adequate volume   Culture      NO GROWTH 1 DAY Performed at [facility], [place] [address], [place], [place] [address]    Report Status PENDING   Culture, blood (x 2)     Status: None (Preliminary result)   Collection Time: 06/03/18  2:50 AM  Result Value Ref Range   Specimen Description BLOOD LEFT ARM    Special Requests      BOTTLES DRAWN AEROBIC AND ANAEROBIC Blood Culture adequate volume   Culture  Setup Time      AEROBIC BOTTLE ONLY GRAM POSITIVE COCCI Organism ID to follow CRITICAL RESULT CALLED TO, READ BACK BY AND VERIFIED WITH: PHARMD N BATCHELDER 06/04/18 AT 812 BY CM Performed at [facility], [place] [address], [place], [place] [address]    Culture GRAM POSITIVE COCCI    Report Status PENDING   Blood Culture ID Panel  (Reflexed)     Status: Abnormal   Collection Time: 06/03/18  2:50 AM  Result Value Ref Range   Enterococcus species NOT DETECTED NOT DETECTED   Listeria monocytogenes NOT DETECTED NOT DETECTED   Staphylococcus species DETECTED (A) NOT DETECTED    Comment: Methicillin (oxacillin) susceptible coagulase negative staphylococcus. Possible blood culture contaminant (unless isolated from more than one blood culture draw or clinical case suggests pathogenicity). No antibiotic treatment is indicated for blood  culture contaminants. CRITICAL RESULT CALLED TO, READ BACK BY AND VERIFIED WITH: PHARMD N BETCHELDER 06/04/18 AT 84 BY CM    Staphylococcus aureus NOT DETECTED NOT DETECTED   Methicillin resistance NOT DETECTED NOT DETECTED   Streptococcus species NOT DETECTED NOT DETECTED   Streptococcus agalactiae NOT DETECTED NOT DETECTED   Streptococcus pneumoniae NOT DETECTED NOT DETECTED   Streptococcus pyogenes NOT DETECTED NOT DETECTED   Acinetobacter baumannii NOT DETECTED NOT DETECTED   Enterobacteriaceae species NOT DETECTED NOT DETECTED   Enterobacter cloacae complex NOT DETECTED NOT DETECTED   Escherichia coli NOT DETECTED NOT DETECTED   Klebsiella oxytoca NOT DETECTED NOT DETECTED   Klebsiella pneumoniae NOT DETECTED NOT DETECTED   Proteus species NOT DETECTED NOT DETECTED   Serratia marcescens NOT DETECTED NOT DETECTED   Haemophilus influenzae NOT DETECTED NOT DETECTED  Neisseria meningitidis NOT DETECTED NOT DETECTED   Pseudomonas aeruginosa NOT DETECTED NOT DETECTED   Candida albicans NOT DETECTED NOT DETECTED   Candida glabrata NOT DETECTED NOT DETECTED   Candida krusei NOT DETECTED NOT DETECTED   Candida parapsilosis NOT DETECTED NOT DETECTED   Candida tropicalis NOT DETECTED NOT DETECTED    Comment: Performed at [facility], [place] [address], [place], [place] [address]  Brain natriuretic peptide     Status: Abnormal   Collection Time: 06/03/18  2:54 AM  Result Value Ref  Range   B Natriuretic Peptide 567.9 (H) 0.0 - 100.0 pg/mL    Comment: Performed at [facility] [address] [place], [place] [address]  Troponin I (q 6hr x 3)     Status: Abnormal   Collection Time: 06/03/18  2:54 AM  Result Value Ref Range   Troponin I 0.05 (HH) <0.03 ng/mL    Comment: CRITICAL RESULT CALLED TO, READ BACK BY AND VERIFIED WITH: ROBINSON J,RN 06/03/18 0350 WAYK Performed at [facility] [address] [place], [place] [address]   Lactic acid, plasma     Status: Abnormal   Collection Time: 06/03/18  2:54 AM  Result Value Ref Range   Lactic Acid, Venous 2.3 (HH) 0.5 - 1.9 mmol/L    Comment: CRITICAL RESULT CALLED TO, READ BACK BY AND VERIFIED WITH: Percell Miller 06/03/18 0339 WAYK Performed at [facility], [place] [address], [place], [place] [address]   Procalcitonin     Status: None   Collection Time: 06/03/18  2:54 AM  Result Value Ref Range   Procalcitonin <0.10 ng/mL    Comment:        Interpretation: PCT (Procalcitonin) <= 0.5 ng/mL: Systemic infection (sepsis) is not likely. Local bacterial infection is possible. (NOTE)       Sepsis PCT Algorithm           Lower Respiratory Tract                                      Infection PCT Algorithm    ----------------------------     ----------------------------         PCT < 0.25 ng/mL                PCT < 0.10 ng/mL         Strongly encourage             Strongly discourage   discontinuation of antibiotics    initiation of antibiotics    ----------------------------     -----------------------------       PCT 0.25 - 0.50 ng/mL            PCT 0.10 - 0.25 ng/mL               OR       >80% decrease in PCT            Discourage initiation of                                            antibiotics      Encourage discontinuation           of antibiotics    ----------------------------     -----------------------------  PCT >= 0.50 ng/mL              PCT 0.26 - 0.50 ng/mL                AND        <80% decrease in PCT             Encourage initiation of                                             antibiotics       Encourage continuation           of antibiotics    ----------------------------     -----------------------------        PCT >= 0.50 ng/mL                  PCT > 0.50 ng/mL               AND         increase in PCT                  Strongly encourage                                      initiation of antibiotics    Strongly encourage escalation           of antibiotics                                     -----------------------------                                           PCT <= 0.25 ng/mL                                                 OR                                        > 80% decrease in PCT                                     Discontinue / Do not initiate                                             antibiotics Performed at [facility], [address], [place], [place] [address]   MRSA PCR Screening     Status: None   Collection Time: 06/03/18  4:54 AM  Result Value Ref Range   MRSA by PCR NEGATIVE NEGATIVE    Comment:        The GeneXpert MRSA Assay (FDA approved for NASAL specimens only),  is one component of a comprehensive MRSA colonization surveillance program. It is not intended to diagnose MRSA infection nor to guide or monitor treatment for MRSA infections. Performed at [facility], [place] [address] [place], [place] [address]   Troponin I (q 6hr x 3)     Status: Abnormal   Collection Time: 06/03/18  6:06 AM  Result Value Ref Range   Troponin I 0.07 (HH) <0.03 ng/mL    Comment: CRITICAL VALUE NOTED.  VALUE IS CONSISTENT WITH PREVIOUSLY REPORTED AND CALLED VALUE. Performed at [facility], [place] [address], [place], [place] [address]   Basic metabolic panel     Status: Abnormal   Collection Time: 06/03/18  6:06 AM  Result Value Ref Range   Sodium 135 135 - 145 mmol/L   Potassium 4.0 3.5 - 5.1 mmol/L    Chloride 97 (L) 98 - 111 mmol/L    Comment: Please note change in reference range.   CO2 26 22 - 32 mmol/L   Glucose, Bld 139 (H) 70 - 99 mg/dL    Comment: Please note change in reference range.   BUN 14 8 - 23 mg/dL    Comment: Please note change in reference range.   Creatinine, Ser 0.75 0.44 - 1.00 mg/dL   Calcium 8.8 (L) 8.9 - 10.3 mg/dL   GFR calc non Af Amer >60 >60 mL/min   GFR calc Af Amer >60 >60 mL/min    Comment: (NOTE) The eGFR has been calculated using the CKD EPI equation. This calculation has not been validated in all clinical situations. eGFR's persistently <60 mL/min signify possible Chronic Kidney Disease.    Anion gap 12 5 - 15    Comment: Performed at [facility] [address] [place] [address] [place], [place] [address]  CBC     Status: Abnormal   Collection Time: 06/03/18  6:06 AM  Result Value Ref Range   WBC 18.0 (H) 4.0 - 10.5 K/uL   RBC 3.99 3.87 - 5.11 MIL/uL   Hemoglobin 11.7 (L) 12.0 - 15.0 g/dL   HCT 35.7 (L) 36.0 - 46.0 %   MCV 89.5 78.0 - 100.0 fL   MCH 29.3 26.0 - 34.0 pg   MCHC 32.8 30.0 - 36.0 g/dL   RDW 12.5 11.5 - 15.5 %   Platelets 381 150 - 400 K/uL    Comment: Performed at [facility], [place] [address], [place], [place] [address]  Lactic acid, plasma     Status: Abnormal   Collection Time: 06/03/18  6:06 AM  Result Value Ref Range   Lactic Acid, Venous 2.1 (HH) 0.5 - 1.9 mmol/L    Comment: CRITICAL RESULT CALLED TO, READ BACK BY AND VERIFIED WITH: Heloise Ochoa AT 4503 06/03/18 BY ZBEECH. Performed at [facility], [place] [address] [place], [place] [address]   Hemoglobin A1c     Status: None   Collection Time: 06/03/18  6:06 AM  Result Value Ref Range   Hgb A1c MFr Bld 5.5 4.8 - 5.6 %    Comment: (NOTE) Pre diabetes:          5.7%-6.4% Diabetes:              >6.4% Glycemic control for   <7.0% adults with diabetes    Mean Plasma Glucose 111.15 mg/dL    Comment: Performed at [facility] [address]  [place], [place] [address]  Lipid panel     Status: Abnormal  Collection Time: 06/03/18  6:06 AM  Result Value Ref Range   Cholesterol 187 0 - 200 mg/dL   Triglycerides 38 <150 mg/dL   HDL 51 >40 mg/dL   Total CHOL/HDL Ratio 3.7 RATIO   VLDL 8 0 - 40 mg/dL   LDL Cholesterol 128 (H) 0 - 99 mg/dL    Comment:        Total Cholesterol/HDL:CHD Risk Coronary Heart Disease Risk Table                     Men   Women  1/2 Average Risk   3.4   3.3  Average Risk       5.0   4.4  2 X Average Risk   9.6   7.1  3 X Average Risk  23.4   11.0        Use the calculated Patient Ratio above and the CHD Risk Table to determine the patient's CHD Risk.        ATP III CLASSIFICATION (LDL):  <100     mg/dL   Optimal  100-129  mg/dL   Near or Above                    Optimal  130-159  mg/dL   Borderline  160-189  mg/dL   High  >190     mg/dL   Very High Performed at [facility] [address] [place], [place] [address]   Culture, Urine     Status: Abnormal (Preliminary result)   Collection Time: 06/03/18 10:45 AM  Result Value Ref Range   Specimen Description URINE, CATHETERIZED    Special Requests      NONE Performed at [facility] [address] [place], [place] [address]    Culture 30,000 COLONIES/mL GRAM NEGATIVE RODS (A)    Report Status PENDING   Troponin I (q 6hr x 3)     Status: Abnormal   Collection Time: 06/03/18 12:04 PM  Result Value Ref Range   Troponin I 0.11 (HH) <0.03 ng/mL    Comment: CRITICAL VALUE NOTED.  VALUE IS CONSISTENT WITH PREVIOUSLY REPORTED AND CALLED VALUE. Performed at [facility], [place] [address] [place], [place] [address]   Glucose, capillary     Status: Abnormal   Collection Time: 06/03/18 10:23 PM  Result Value Ref Range   Glucose-Capillary 154 (H) 70 - 99 mg/dL  CBC     Status: Abnormal   Collection Time: 06/04/18  6:40 AM  Result Value Ref Range   WBC 16.0 (H) 4.0 - 10.5 K/uL   RBC 4.10 3.87 - 5.11 MIL/uL   Hemoglobin 12.2 12.0  - 15.0 g/dL   HCT 36.8 36.0 - 46.0 %   MCV 89.8 78.0 - 100.0 fL   MCH 29.8 26.0 - 34.0 pg   MCHC 33.2 30.0 - 36.0 g/dL   RDW 12.8 11.5 - 15.5 %   Platelets 370 150 - 400 K/uL    Comment: Performed at [facility], [place] [address], [place], [place] [address]  Basic metabolic panel     Status: Abnormal   Collection Time: 06/04/18  6:40 AM  Result Value Ref Range   Sodium 137 135 - 145 mmol/L   Potassium 3.4 (L) 3.5 - 5.1 mmol/L   Chloride 98 98 - 111 mmol/L    Comment: Please note change in reference range.   CO2 26 22 - 32 mmol/L  Glucose, Bld 138 (H) 70 - 99 mg/dL    Comment: Please note change in reference range.   BUN 15 8 - 23 mg/dL    Comment: Please note change in reference range.   Creatinine, Ser 0.80 0.44 - 1.00 mg/dL   Calcium 9.2 8.9 - 10.3 mg/dL   GFR calc non Af Amer >60 >60 mL/min   GFR calc Af Amer >60 >60 mL/min    Comment: (NOTE) The eGFR has been calculated using the CKD EPI equation. This calculation has not been validated in all clinical situations. eGFR's persistently <60 mL/min signify possible Chronic Kidney Disease.    Anion gap 13 5 - 15    Comment: Performed at [facility] [address], [place], [place] [address]    Dg Chest 2 View  Result Date: 06/02/2018 CLINICAL DATA:  Chest pain and vomiting. EXAM: CHEST - 2 VIEW COMPARISON:  03/19/2018 and chest CT 08/05/2015 FINDINGS: Lungs are somewhat hypoinflated demonstrate mild opacification over the left base which may be due to small amount of pleural fluid/atelectasis. Mild prominence of the perihilar markings suggesting mild degree of vascular congestion. Mild stable cardiomegaly. Stable elevation of the left hemidiaphragm with large hiatal hernia unchanged. Remainder of the exam is unchanged. IMPRESSION: Mild cardiomegaly with suggestion of mild vascular congestion. Mild left base opacification likely small effusion with atelectasis. Large hiatal hernia unchanged. Electronically Signed   By:  Marin Olp M.D.   On: 06/02/2018 21:42   Ct Abdomen Pelvis W Contrast  Result Date: 06/02/2018 CLINICAL DATA:  84 year old female with acute abdominal pain and vomiting today. EXAM: CT ABDOMEN AND PELVIS WITH CONTRAST TECHNIQUE: Multidetector CT imaging of the abdomen and pelvis was performed using the standard protocol following bolus administration of intravenous contrast. CONTRAST:  166m OMNIPAQUE IOHEXOL 300 MG/ML  SOLN COMPARISON:  05/20/2016 and prior CTs FINDINGS: Lower chest: LEFT basilar atelectasis noted. Hepatobiliary: The liver and gallbladder are unremarkable. Pancreas: Unremarkable Spleen: Unremarkable Adrenals/Urinary Tract: Moderate to marked distention of bladder again noted. Kidneys and adrenal glands are unremarkable. Stomach/Bowel: A large hiatal hernia is noted with a distended stomach in the LOWER LEFT chest/UPPER abdomen-but unchanged in appearance and configuration since 08/05/2015 No dilated small bowel loops identified. No colonic abnormalities are identified. Vascular/Lymphatic: Aortic atherosclerosis. No enlarged abdominal or pelvic lymph nodes. Reproductive: Uterus and bilateral adnexa are unremarkable. Other: No ascites, pneumoperitoneum or abscess. A moderate paraumbilical hernia containing fat is present. Musculoskeletal: No acute abnormalities identified. Lumbar scoliosis and moderate to severe multilevel degenerative changes again noted. LEFT hip arthroplasty again identified. IMPRESSION: 1. Large hiatal hernia with distended stomach in the LOWER LEFT chest/UPPER abdomen with unchanged appearance and configuration of the stomach when compared to 08/05/2015. 2. Distended bladder. 3. Moderate paraumbilical hernia containing fat 4.  Aortic Atherosclerosis (ICD10-I70.0). Electronically Signed   By: JMargarette CanadaM.D.   On: 06/02/2018 23:28    Review of Systems  Constitutional: Negative for chills and fever.  HENT: Negative for hearing loss.   Eyes: Negative for blurred vision  and double vision.  Respiratory: Negative for cough and hemoptysis.   Cardiovascular: Negative for chest pain and palpitations.  Gastrointestinal: Positive for abdominal pain, blood in stool, heartburn, nausea and vomiting.  Genitourinary: Negative for dysuria and urgency.  Musculoskeletal: Negative for myalgias and neck pain.  Skin: Negative for itching and rash.  Neurological: Negative for dizziness, tingling and headaches.  Endo/Heme/Allergies: Does not bruise/bleed easily.  Psychiatric/Behavioral: Negative for depression and suicidal ideas.   Blood  pressure (!) 150/87, pulse 97, temperature (!) 97.4 F (36.3 C), temperature source Oral, resp. rate 18, height 5' 1"  (1.549 m), weight 69.1 kg (152 lb 5.4 oz), SpO2 97 %. Physical Exam  Vitals reviewed. Constitutional: She appears well-developed and well-nourished.  HENT:  Head: Normocephalic and atraumatic.  Eyes: Pupils are equal, round, and reactive to light. Conjunctivae and EOM are normal.  Neck: Normal range of motion. Neck supple.  Cardiovascular: Normal rate and regular rhythm.  Respiratory: Effort normal and breath sounds normal.  GI: Soft. Bowel sounds are normal. She exhibits no distension. There is no tenderness.  Musculoskeletal: Normal range of motion.  Neurological: She is alert.  Skin: Skin is warm and dry.  Psychiatric: She has a normal mood and affect. Her behavior is normal.   Assessment/Plan: 84 yo female severely deconditioned with nausea vomiting. On Ct she was noted to have a very large hiatal hernia containing the majority of her stomach in the sac. The fluid in cannister is concerning for blood. She has no documented history of anticoagulation -will get coagulation panel -continue NG tube -we discussed briefly that if this is not able to resolve on its own the surgical option is for hernia reduction with gastric pexy with gastric feeding tube. The patient is DNR and previous discussions were that she did not  want a feeding tube and only wanted hydration if necessary as treatment. I am concerned that no matter what her treatment plan entails that she has a high likelihood for poor outcome and given multiple steps made to avoid high level medical interventions, I am not sure a surgical procedure is the best thing for her -will continue to follow and continue discussion  Arta Bruce Dniyah Grant 06/04/2018, 11:53 AM

## 2018-06-04 NOTE — Progress Notes (Signed)
Phenergan 12.5 given at [employer] [facility]. Pt vomited again at 0400, 200 cc of brown emesis. MD notified.

## 2018-06-04 NOTE — Progress Notes (Signed)
PHARMACY - PHYSICIAN COMMUNICATION CRITICAL VALUE ALERT - BLOOD CULTURE IDENTIFICATION (BCID)  [name] is an 84 y.o. female who presented to [facility] on 06/02/2018 with a chief complaint of N/V and abdominal pain  Assessment:   84 yo found to have hiatal hernia. Mild burning with urination. Day #2 of Rocephin for possible UTI. Afebrile, WBC down to 16. Urine cx sent.  Name of physician (or Provider) Contacted: D. Elgergawy  Current antibiotics: Rocephin  Changes to prescribed antibiotics recommended:  No changed needed F/U urine cx results  Results for orders placed or performed during the hospital encounter of 06/02/18  Blood Culture ID Panel (Reflexed) (Collected: 06/03/2018  2:50 AM)  Result Value Ref Range   Enterococcus species NOT DETECTED NOT DETECTED   Listeria monocytogenes NOT DETECTED NOT DETECTED   Staphylococcus species DETECTED (A) NOT DETECTED   Staphylococcus aureus NOT DETECTED NOT DETECTED   Methicillin resistance NOT DETECTED NOT DETECTED   Streptococcus species NOT DETECTED NOT DETECTED   Streptococcus agalactiae NOT DETECTED NOT DETECTED   Streptococcus pneumoniae NOT DETECTED NOT DETECTED   Streptococcus pyogenes NOT DETECTED NOT DETECTED   Acinetobacter baumannii NOT DETECTED NOT DETECTED   Enterobacteriaceae species NOT DETECTED NOT DETECTED   Enterobacter cloacae complex NOT DETECTED NOT DETECTED   Escherichia coli NOT DETECTED NOT DETECTED   Klebsiella oxytoca NOT DETECTED NOT DETECTED   Klebsiella pneumoniae NOT DETECTED NOT DETECTED   Proteus species NOT DETECTED NOT DETECTED   Serratia marcescens NOT DETECTED NOT DETECTED   Haemophilus influenzae NOT DETECTED NOT DETECTED   Neisseria meningitidis NOT DETECTED NOT DETECTED   Pseudomonas aeruginosa NOT DETECTED NOT DETECTED   Candida albicans NOT DETECTED NOT DETECTED   Candida glabrata NOT DETECTED NOT DETECTED   Candida krusei NOT DETECTED NOT DETECTED   Candida parapsilosis NOT DETECTED  NOT DETECTED   Candida tropicalis NOT DETECTED NOT DETECTED    [name] 06/04/2018  8:20 AM

## 2018-06-04 NOTE — H&P (View-Only) (Signed)
Referring Provider:  [place] Primary Care Physician:  Deland Pretty, MD Primary Gastroenterologist:  unassigned  Reason for Consultation:  Coffee-ground emesis, large hiatal hernia  HPI: [name] is a 84 y.o. female with multiple medical co morbidities as mentioned below presented to the hospital with chest pain, abdominal pain, nausea and vomiting. She was found to have a leukocytosis upon initial evaluation.  CT abdomen pelvis showed very large hiatal hernia.started having coffee-ground emesis this morning. GI is consulted for further evaluation.  Patient seen and examined at bedside. She is in mild distress from NG suction she started noticing worsening nausea vomiting yesterday followed by abdominal pain. Abdominal pain was located in epigastric area which is improving now. . She denies any black tarry stool.denied any NSAID use. Complaining of chest pain but denied any increasing shortness of breath.   NG suction in place draining coffee-ground liquid material.  Past Medical History:  Diagnosis Date  . Actinic keratosis   . Allergic rhinitis   . Back pain   . Chronic constipation   . Chronic diastolic CHF (congestive heart failure) ([place]) 06/03/2018  . GERD (gastroesophageal reflux disease)   . Hyperlipidemia   . Hypertension   . Hyponatremia   . Leg edema   . Macular degeneration   . Mitral regurgitation    mild by echo 2003  . Osteoarthritis   . Osteoarthrosis   . Osteoporosis   . Tenosynovitis    myxomatous    Past Surgical History:  Procedure Laterality Date  . CARPAL TUNNEL RELEASE     bilateral  . hernial repain    . INGUINAL HERNIA REPAIR     laparoscopic preperitoneal repair of bilateral inguinal hernias  . REPLACEMENT TOTAL KNEE BILATERAL    . REVERSE SHOULDER ARTHROPLASTY Right 05/08/2014   Procedure: REVERSE SHOULDER ARTHROPLASTY RIGHT ;  Surgeon: Marin Shutter, MD;  Location: [place];  Service: Orthopedics;  Laterality: Right;  . SYNOVECTOMY     of right  long finger  . tonisellectomy    . tonisillectomy    . TONSILLECTOMY      Prior to Admission medications   Medication Sig Start Date End Date Taking? Authorizing Provider  Calcium Carbonate-Vitamin D3 (CALCIUM 600+D3) 600-400 MG-UNIT TABS Take 1 tablet by mouth daily.   Yes [provider]  cetirizine (ZYRTEC) 10 MG tablet Take 10 mg by mouth daily.    Yes [provider]  docusate sodium (COLACE) 100 MG capsule Take 100 mg by mouth 2 (two) times daily.   Yes [provider]  estradiol (ESTRACE) 0.1 MG/GM vaginal cream Place 1 Applicatorful vaginally 2 (two) times a week. Mondays and Thursdays   Yes [provider]  gabapentin (NEURONTIN) 100 MG capsule Take 100 mg by mouth at bedtime.   Yes [provider]  irbesartan (AVAPRO) 75 MG tablet Take 1 tablet (75 mg total) by mouth daily. 05/10/14  Yes Tat, Shanon Brow, MD  Multiple Vitamins-Minerals (CENTRUM SILVER ULTRA WOMENS PO) Take 1 tablet by mouth daily.   Yes [provider]  Multiple Vitamins-Minerals (PRESERVISION AREDS 2 PO) Take 2 tablets by mouth daily.    Yes [provider]  omeprazole (PRILOSEC) 20 MG capsule Take 20 mg by mouth daily.   Yes [provider]  ondansetron (ZOFRAN) 4 MG tablet Take 4 mg by mouth every 6 (six) hours as needed for nausea or vomiting.   Yes [provider]  oxyCODONE (OXY IR/ROXICODONE) 5 MG immediate release tablet Take 5 mg by  mouth every 8 (eight) hours as needed for severe pain.   Yes [provider]  Probiotic Product (PROBIOTIC DAILY) CAPS Take 250 mg by mouth daily.    Yes [provider]  spironolactone (ALDACTONE) 25 MG tablet Take 25 mg by mouth daily.    Yes [provider]  trimethoprim (TRIMPEX) 100 MG tablet Take 100 mg by mouth daily. UTI prophylaxis.   Yes [provider]  pseudoephedrine-guaifenesin (MUCINEX D) 60-600 MG 12 hr tablet Take 1 tablet by mouth every 12 (twelve) hours.  06/02/18 06/05/18  [provider]    Scheduled Meds: . metoCLOPramide (REGLAN) injection  5 mg Intravenous Q8H  . [START ON 06/07/2018] pantoprazole  40 mg Intravenous Q12H   Continuous Infusions: . sodium chloride 50 mL/hr at 06/03/18 0447  . cefTRIAXone (ROCEPHIN)  IV 1 g (06/03/18 1117)  . famotidine (PEPCID) IV 20 mg (06/03/18 2111)  . pantoprazole (PROTONIX) IVPB    . pantoprozole (PROTONIX) infusion     PRN Meds:.acetaminophen, fentaNYL (SUBLIMAZE) injection, hydrALAZINE, nitroGLYCERIN, ondansetron (ZOFRAN) IV  Allergies as of 06/02/2018 - Review Complete 06/02/2018  Allergen Reaction Noted  . Aspirin Other (See Comments) 01/15/2010  . Boniva [ibandronic acid] Other (See Comments) 06/09/2012  . Morphine Other (See Comments) 01/15/2010  . Nitrofuran derivatives Other (See Comments) 06/09/2012  . Penicillins Other (See Comments) 01/15/2010  . Sulfonamide derivatives Other (See Comments) 01/15/2010    Family History  Problem Relation Age of Onset  . Heart attack Father   . Heart attack Brother     Social History   Socioeconomic History  . Marital status: Divorced    Spouse name: Not on file  . Number of children: 4  . Years of education: Not on file  . Highest education level: Not on file  Occupational History  . Not on file  Social Needs  . Financial resource strain: Not on file  . Food insecurity:    Worry: Not on file    Inability: Not on file  . Transportation needs:    Medical: Not on file    Non-medical: Not on file  Tobacco Use  . Smoking status: Former Smoker    Packs/day: 1.00    Years: 2.00    Pack years: 2.00    Types: Cigarettes    Last attempt to quit: 12/07/1967    Years since quitting: 50.5  . Smokeless tobacco: Never Used  Substance and Sexual Activity  . Alcohol use: Yes    Alcohol/week: 1.2 oz    Types: 2 Glasses of wine per week    Comment: 2 glasses per week  . Drug use: No  . Sexual activity: Never  Lifestyle  . Physical  activity:    Days per week: Not on file    Minutes per session: Not on file  . Stress: Not on file  Relationships  . Social connections:    Talks on phone: Not on file    Gets together: Not on file    Attends religious service: Not on file    Active member of club or organization: Not on file    Attends meetings of clubs or organizations: Not on file    Relationship status: Not on file  . Intimate partner violence:    Fear of current or ex partner: Not on file    Emotionally abused: Not on file    Physically abused: Not on file    Forced sexual activity: Not on file  Other Topics Concern  .  Not on file  Social History Narrative  . Not on file    Review of Systems: Review of Systems  Constitutional: Negative for chills and fever.  HENT: Negative for hearing loss and tinnitus.   Eyes: Negative for blurred vision and double vision.  Respiratory: Negative for cough and hemoptysis.   Cardiovascular: Positive for chest pain. Negative for palpitations and orthopnea.  Gastrointestinal: Positive for abdominal pain, heartburn, nausea and vomiting. Negative for blood in stool and melena.  Genitourinary: Negative for dysuria and urgency.  Musculoskeletal: Positive for back pain and joint pain.  Skin: Negative for rash.  Neurological: Negative for seizures and loss of consciousness.  Endo/Heme/Allergies: Does not bruise/bleed easily.  Psychiatric/Behavioral: Negative for hallucinations and suicidal ideas.    Physical Exam: Vital signs: Vitals:   06/03/18 2059 06/04/18 0516  BP: (!) 158/95 (!) 150/87  Pulse: 95 97  Resp: 18 18  Temp: 98.5 F (36.9 C) (!) 97.4 F (36.3 C)  SpO2: 95% 97%   Last BM Date: 06/01/18 Physical Exam  Constitutional: She is oriented to person, place, and time. She appears well-developed and well-nourished. She appears distressed.  HENT:  NG tube in place.   Eyes: EOM are normal. No scleral icterus.  Neck: Normal range of motion. Neck supple.   Cardiovascular: Normal rate and regular rhythm.  Murmur heard. Pulmonary/Chest: No respiratory distress.  Basilar  crackles.  Abdominal: Soft. Bowel sounds are normal. She exhibits no distension. There is tenderness. There is no rebound and no guarding.  Epigastric tenderness to palpation  Musculoskeletal: Normal range of motion. She exhibits no edema.  Neurological: She is alert and oriented to person, place, and time.  Skin: Skin is warm. No erythema.  Psychiatric: She has a normal mood and affect. Judgment normal.  Vitals reviewed.   GI:  Lab Results: Recent Labs    06/02/18 2053 06/03/18 0606 06/04/18 0640  WBC 16.5* 18.0* 16.0*  HGB 12.0 11.7* 12.2  HCT 36.4 35.7* 36.8  PLT 432* 381 370   BMET Recent Labs    06/02/18 2053 06/03/18 0606 06/04/18 0640  NA 132* 135 137  K 4.3 4.0 3.4*  CL 94* 97* 98  CO2 24 26 26   GLUCOSE 162* 139* 138*  BUN 17 14 15   CREATININE 0.80 0.75 0.80  CALCIUM 9.2 8.8* 9.2   LFT Recent Labs    06/02/18 2234  PROT 7.0  ALBUMIN 3.6  AST 23  ALT 14  ALKPHOS 78  BILITOT 0.7  BILIDIR <0.1  IBILI NOT CALCULATED   PT/INR No results for input(s): LABPROT, INR in the last 72 hours.   Studies/Results: Dg Chest 2 View  Result Date: 06/02/2018 CLINICAL DATA:  Chest pain and vomiting. EXAM: CHEST - 2 VIEW COMPARISON:  03/19/2018 and chest CT 08/05/2015 FINDINGS: Lungs are somewhat hypoinflated demonstrate mild opacification over the left base which may be due to small amount of pleural fluid/atelectasis. Mild prominence of the perihilar markings suggesting mild degree of vascular congestion. Mild stable cardiomegaly. Stable elevation of the left hemidiaphragm with large hiatal hernia unchanged. Remainder of the exam is unchanged. IMPRESSION: Mild cardiomegaly with suggestion of mild vascular congestion. Mild left base opacification likely small effusion with atelectasis. Large hiatal hernia unchanged. Electronically Signed   By: Marin Olp M.D.   On: 06/02/2018 21:42   Ct Abdomen Pelvis W Contrast  Result Date: 06/02/2018 CLINICAL DATA:  84 year old female with acute abdominal pain and vomiting today. EXAM: CT ABDOMEN AND PELVIS WITH CONTRAST TECHNIQUE: Multidetector  CT imaging of the abdomen and pelvis was performed using the standard protocol following bolus administration of intravenous contrast. CONTRAST:  166mL OMNIPAQUE IOHEXOL 300 MG/ML  SOLN COMPARISON:  05/20/2016 and prior CTs FINDINGS: Lower chest: LEFT basilar atelectasis noted. Hepatobiliary: The liver and gallbladder are unremarkable. Pancreas: Unremarkable Spleen: Unremarkable Adrenals/Urinary Tract: Moderate to marked distention of bladder again noted. Kidneys and adrenal glands are unremarkable. Stomach/Bowel: A large hiatal hernia is noted with a distended stomach in the LOWER LEFT chest/UPPER abdomen-but unchanged in appearance and configuration since 08/05/2015 No dilated small bowel loops identified. No colonic abnormalities are identified. Vascular/Lymphatic: Aortic atherosclerosis. No enlarged abdominal or pelvic lymph nodes. Reproductive: Uterus and bilateral adnexa are unremarkable. Other: No ascites, pneumoperitoneum or abscess. A moderate paraumbilical hernia containing fat is present. Musculoskeletal: No acute abnormalities identified. Lumbar scoliosis and moderate to severe multilevel degenerative changes again noted. LEFT hip arthroplasty again identified. IMPRESSION: 1. Large hiatal hernia with distended stomach in the LOWER LEFT chest/UPPER abdomen with unchanged appearance and configuration of the stomach when compared to 08/05/2015. 2. Distended bladder. 3. Moderate paraumbilical hernia containing fat 4.  Aortic Atherosclerosis (ICD10-I70.0). Electronically Signed   By: Margarette Canada M.D.   On: 06/02/2018 23:28    Impression/Plan: - nausea, vomiting, coffee-ground emesis and epigastric pain in setting of very large hiatal hernia. - Large hiatal hernia -  Elevated troponins. No acute cardiac events. Most likely from underlying medical issues.  Recommendations --------------------------- - Continue NG suction. - Continue PPI - Plan for EGD tomorrow. - Appreciate cardiology input. - Nothing by mouth for now - recommended surgical consultation  Risks (bleeding, infection, bowel perforation that could require surgery, sedation-related changes in cardiopulmonary systems), benefits (identification and possible treatment of source of symptoms, exclusion of certain causes of symptoms), and alternatives (watchful waiting, radiographic imaging studies, empiric medical treatment)  were explained to patient in detail and patient wishes to proceed.   LOS: 0 days   Otis Brace  MD, [place] 06/04/2018, 10:13 AM  Contact #  [phone]

## 2018-06-04 NOTE — Progress Notes (Signed)
PROGRESS NOTE                                                                                                                                                                                                             Patient Demographics:    [name], is a 84 y.o. female, DOB - [birth year], QPR:[id number]  Admit date - 06/02/2018   Admitting Physician Ivor Costa, MD  Outpatient Primary MD for the patient is Deland Pretty, MD  LOS - 0   Chief Complaint  Patient presents with  . Chest Pain  . Abdominal Pain       Brief Narrative    [name] is a 84 y.o. female with medical history significant of hypertension, hyperlipidemia, GERD, mitral valve regurg, dCHF, hiatal hernia, who presents with intractable nausea, vomiting, abdominal pain, and chest pain. Work-up significant for urinary retention, mild UTI, she continues to have nausea and vomiting during hospital stay, apparently this is secondary to large hiatal hernia most of the stomach and her sac, as well she did have coffee-ground emesis 06/04/2018, where she required NGT insertion on suction, has been seen by GI, cardiology, and General surgery.    Subjective:    [name] today complaining of abdominal pain, continues to have nausea and vomiting overnight and this morning, had NGT inserted, has more than 1000 of coffee-ground material  3 hours .   Assessment  & Plan :    Principal Problem:   Hiatal hernia Active Problems:   Leukocytosis   Essential hypertension, benign   Chest pain   Abdominal pain   GERD (gastroesophageal reflux disease)   Elevated lactic acid level   Chronic diastolic CHF (congestive heart failure) (HCC)   SIRS (systemic inflammatory response syndrome) (HCC)    Intractable nausea and vomiting secondary to large hiatal hernia -Elderly, frail, no improvement over last 24 hours with PRN nausea medication, actually her nausea and vomiting is getting  worse, so NGT was inserted today, with significant output of coffee-ground emesis material. -She remains wide symptomatic before NG tube suction, general surgery were consulted for recommendation, for now keep n.p.o. on IV fluids. -Coffee-ground NGT output and emesis overnight, her hemoglobin remained stable, I have stopped her DVT prophylaxis, and start on Protonix drip.  Chest pain with elevated troponins: -Neurology input greatly appreciated, this is most  likely in the setting of mild ischemia, 2D echo with normal EF, and without any regional wall motion abnormalities, no further work-up per cardiology . -pt is allergic to ASA  SIRS:  -Cytosis most likely related to her vomiting, she had elevated lactic acid as well, work-up significant for UTI urine culture growing E. coli, continue with Rocephin .  Essential hypertension: Bp 156/87. -hold oral Meds  -IV hydralazine as needed  GERD (gastroesophageal reflux disease) -IV pepcid  Chronic diastolic CHF (congestive heart failure) ([place]): 2D echo on 05/06/2014 showed EF of 55 with grade 1 diastolic dysfunction.  Patient does not have leg edema.  No shortness of breath.  CHF seems to be compensated. -Hold spironolactone      Code Status : DNR  Family Communication  : Discussed with daughter via phone  Disposition Plan  : SNF  Consults  :  cardiology, GI, general surgery  Procedures  : none  DVT Prophylaxis  : Subcu Lovenox and started on SCD  Lab Results  Component Value Date   PLT 352 06/04/2018    Antibiotics  :    Anti-infectives (From admission, onward)   Start     Dose/Rate Route Frequency Ordered Stop   06/03/18 0900  ciprofloxacin (CIPRO) IVPB 400 mg  Status:  Discontinued     400 mg 200 mL/hr over 60 Minutes Intravenous Every 12 hours 06/03/18 0757 06/03/18 0759   06/03/18 0800  cefTRIAXone (ROCEPHIN) 1 g in sodium chloride 0.9 % 100 mL IVPB     1 g 200 mL/hr over 30 Minutes Intravenous Every 24 hours  06/03/18 0759          Objective:   Vitals:   06/03/18 0737 06/03/18 1437 06/03/18 2059 06/04/18 0516  BP: (!) 151/87 (!) 156/91 (!) 158/95 (!) 150/87  Pulse: 96 100 95 97  Resp:  18 18 18   Temp:  98.9 F (37.2 C) 98.5 F (36.9 C) (!) 97.4 F (36.3 C)  TempSrc:   Oral Oral  SpO2:  96% 95% 97%  Weight:      Height:        Wt Readings from Last 3 Encounters:  06/03/18 69.1 kg (152 lb 5.4 oz)  03/22/18 73.9 kg (163 lb)  10/06/16 70.9 kg (156 lb 6.4 oz)     Intake/Output Summary (Last 24 hours) at 06/04/2018 1619 Last data filed at 06/04/2018 1100 Gross per 24 hour  Intake 2276.74 ml  Output -  Net 2276.74 ml     Physical Exam Frail chronically ill-appearing female, laying in bed in mild discomfort secondary to vomiting overnight, x3 . Symmetrical Chest wall movement, Good air movement bilaterally, CTAB RRR,No Gallops,Rubs or new Murmurs, No Parasternal Heave +ve B.Sounds, she does have some epigastric tenderness, no rebound - guarding or rigidity. No Cyanosis, Clubbing or edema, No new Rash or bruise       Data Review:    CBC Recent Labs  Lab 06/02/18 2053 06/03/18 0606 06/04/18 0640 06/04/18 1132  WBC 16.5* 18.0* 16.0* 17.8*  HGB 12.0 11.7* 12.2 11.5*  HCT 36.4 35.7* 36.8 34.8*  PLT 432* 381 370 352  MCV 90.1 89.5 89.8 89.7  MCH 29.7 29.3 29.8 29.6  MCHC 33.0 32.8 33.2 33.0  RDW 12.5 12.5 12.8 12.7    Chemistries  Recent Labs  Lab 06/02/18 2053 06/02/18 2234 06/03/18 0606 06/04/18 0640  NA 132*  --  135 137  K 4.3  --  4.0 3.4*  CL 94*  --  97*  98  CO2 24  --  26 26  GLUCOSE 162*  --  139* 138*  BUN 17  --  14 15  CREATININE 0.80  --  0.75 0.80  CALCIUM 9.2  --  8.8* 9.2  AST  --  23  --   --   ALT  --  14  --   --   ALKPHOS  --  78  --   --   BILITOT  --  0.7  --   --    ------------------------------------------------------------------------------------------------------------------ Recent Labs    06/03/18 0606  CHOL 187  HDL 51    LDLCALC 128*  TRIG 38  CHOLHDL 3.7    Lab Results  Component Value Date   HGBA1C 5.5 06/03/2018   ------------------------------------------------------------------------------------------------------------------ No results for input(s): TSH, T4TOTAL, T3FREE, THYROIDAB in the last 72 hours.  Invalid input(s): FREET3 ------------------------------------------------------------------------------------------------------------------ No results for input(s): VITAMINB12, FOLATE, FERRITIN, TIBC, IRON, RETICCTPCT in the last 72 hours.  Coagulation profile Recent Labs  Lab 06/04/18 1513  INR 1.21    No results for input(s): DDIMER in the last 72 hours.  Cardiac Enzymes Recent Labs  Lab 06/03/18 0254 06/03/18 0606 06/03/18 1204  TROPONINI 0.05* 0.07* 0.11*   ------------------------------------------------------------------------------------------------------------------    Component Value Date/Time   BNP 567.9 (H) 06/03/2018 0254    Inpatient Medications  Scheduled Meds: . metoCLOPramide (REGLAN) injection  5 mg Intravenous Q8H  . [START ON 06/07/2018] pantoprazole  40 mg Intravenous Q12H   Continuous Infusions: . sodium chloride 25 mL/hr at 06/04/18 1252  . cefTRIAXone (ROCEPHIN)  IV 1 g (06/04/18 1530)  . famotidine (PEPCID) IV 20 mg (06/03/18 2111)  . pantoprozole (PROTONIX) infusion 8 mg/hr (06/04/18 1346)   PRN Meds:.acetaminophen, fentaNYL (SUBLIMAZE) injection, hydrALAZINE, nitroGLYCERIN, ondansetron (ZOFRAN) IV  Micro Results Recent Results (from the past 240 hour(s))  Culture, blood (x 2)     Status: None (Preliminary result)   Collection Time: 06/03/18  2:30 AM  Result Value Ref Range Status   Specimen Description BLOOD RIGHT ARM  Final   Special Requests   Final    BOTTLES DRAWN AEROBIC AND ANAEROBIC Blood Culture adequate volume   Culture   Final    NO GROWTH 1 DAY Performed at [facility], [place] [address], [place], [place] [address]    Report  Status PENDING  Incomplete  Culture, blood (x 2)     Status: None (Preliminary result)   Collection Time: 06/03/18  2:50 AM  Result Value Ref Range Status   Specimen Description BLOOD LEFT ARM  Final   Special Requests   Final    BOTTLES DRAWN AEROBIC AND ANAEROBIC Blood Culture adequate volume   Culture  Setup Time   Final    AEROBIC BOTTLE ONLY GRAM POSITIVE COCCI Organism ID to follow CRITICAL RESULT CALLED TO, READ BACK BY AND VERIFIED WITH: PHARMD N BATCHELDER 06/04/18 AT 812 BY CM Performed at [facility], [place] [address], [place], [place] [address]    Culture GRAM POSITIVE COCCI  Final   Report Status PENDING  Incomplete  Blood Culture ID Panel (Reflexed)     Status: Abnormal   Collection Time: 06/03/18  2:50 AM  Result Value Ref Range Status   Enterococcus species NOT DETECTED NOT DETECTED Final   Listeria monocytogenes NOT DETECTED NOT DETECTED Final   Staphylococcus species DETECTED (A) NOT DETECTED Final    Comment: Methicillin (oxacillin) susceptible coagulase negative staphylococcus. Possible blood culture contaminant (unless isolated from more  than one blood culture draw or clinical case suggests pathogenicity). No antibiotic treatment is indicated for blood  culture contaminants. CRITICAL RESULT CALLED TO, READ BACK BY AND VERIFIED WITH: PHARMD N BETCHELDER 06/04/18 AT 812 BY CM    Staphylococcus aureus NOT DETECTED NOT DETECTED Final   Methicillin resistance NOT DETECTED NOT DETECTED Final   Streptococcus species NOT DETECTED NOT DETECTED Final   Streptococcus agalactiae NOT DETECTED NOT DETECTED Final   Streptococcus pneumoniae NOT DETECTED NOT DETECTED Final   Streptococcus pyogenes NOT DETECTED NOT DETECTED Final   Acinetobacter baumannii NOT DETECTED NOT DETECTED Final   Enterobacteriaceae species NOT DETECTED NOT DETECTED Final   Enterobacter cloacae complex NOT DETECTED NOT DETECTED Final   Escherichia coli NOT DETECTED NOT DETECTED Final   Klebsiella  oxytoca NOT DETECTED NOT DETECTED Final   Klebsiella pneumoniae NOT DETECTED NOT DETECTED Final   Proteus species NOT DETECTED NOT DETECTED Final   Serratia marcescens NOT DETECTED NOT DETECTED Final   Haemophilus influenzae NOT DETECTED NOT DETECTED Final   Neisseria meningitidis NOT DETECTED NOT DETECTED Final   Pseudomonas aeruginosa NOT DETECTED NOT DETECTED Final   Candida albicans NOT DETECTED NOT DETECTED Final   Candida glabrata NOT DETECTED NOT DETECTED Final   Candida krusei NOT DETECTED NOT DETECTED Final   Candida parapsilosis NOT DETECTED NOT DETECTED Final   Candida tropicalis NOT DETECTED NOT DETECTED Final    Comment: Performed at [facility], [place]. [address] [place], [place] [address]  MRSA PCR Screening     Status: None   Collection Time: 06/03/18  4:54 AM  Result Value Ref Range Status   MRSA by PCR NEGATIVE NEGATIVE Final    Comment:        The GeneXpert MRSA Assay (FDA approved for NASAL specimens only), is one component of a comprehensive MRSA colonization surveillance program. It is not intended to diagnose MRSA infection nor to guide or monitor treatment for MRSA infections. Performed at [facility], [place] [address], [place], [place] [address]   Culture, Urine     Status: Abnormal (Preliminary result)   Collection Time: 06/03/18 10:45 AM  Result Value Ref Range Status   Specimen Description URINE, CATHETERIZED  Final   Special Requests   Final    NONE Performed at [facility], [place] [address] [place], [place] [address]    Culture 30,000 COLONIES/mL ESCHERICHIA COLI (A)  Final   Report Status PENDING  Incomplete    Radiology Reports Dg Chest 2 View  Result Date: 06/02/2018 CLINICAL DATA:  Chest pain and vomiting. EXAM: CHEST - 2 VIEW COMPARISON:  03/19/2018 and chest CT 08/05/2015 FINDINGS: Lungs are somewhat hypoinflated demonstrate mild opacification over the left base which may be due to small amount of pleural  fluid/atelectasis. Mild prominence of the perihilar markings suggesting mild degree of vascular congestion. Mild stable cardiomegaly. Stable elevation of the left hemidiaphragm with large hiatal hernia unchanged. Remainder of the exam is unchanged. IMPRESSION: Mild cardiomegaly with suggestion of mild vascular congestion. Mild left base opacification likely small effusion with atelectasis. Large hiatal hernia unchanged. Electronically Signed   By: Marin Olp M.D.   On: 06/02/2018 21:42   Ct Abdomen Pelvis W Contrast  Result Date: 06/02/2018 CLINICAL DATA:  84 year old female with acute abdominal pain and vomiting today. EXAM: CT ABDOMEN AND PELVIS WITH CONTRAST TECHNIQUE: Multidetector CT imaging of the abdomen and pelvis was performed using the standard protocol following bolus administration of intravenous contrast. CONTRAST:  126mL OMNIPAQUE IOHEXOL 300 MG/ML  SOLN COMPARISON:  05/20/2016 and prior CTs FINDINGS: Lower chest: LEFT basilar atelectasis noted. Hepatobiliary: The liver and gallbladder are unremarkable. Pancreas: Unremarkable Spleen: Unremarkable Adrenals/Urinary Tract: Moderate to marked distention of bladder again noted. Kidneys and adrenal glands are unremarkable. Stomach/Bowel: A large hiatal hernia is noted with a distended stomach in the LOWER LEFT chest/UPPER abdomen-but unchanged in appearance and configuration since 08/05/2015 No dilated small bowel loops identified. No colonic abnormalities are identified. Vascular/Lymphatic: Aortic atherosclerosis. No enlarged abdominal or pelvic lymph nodes. Reproductive: Uterus and bilateral adnexa are unremarkable. Other: No ascites, pneumoperitoneum or abscess. A moderate paraumbilical hernia containing fat is present. Musculoskeletal: No acute abnormalities identified. Lumbar scoliosis and moderate to severe multilevel degenerative changes again noted. LEFT hip arthroplasty again identified. IMPRESSION: 1. Large hiatal hernia with distended  stomach in the LOWER LEFT chest/UPPER abdomen with unchanged appearance and configuration of the stomach when compared to 08/05/2015. 2. Distended bladder. 3. Moderate paraumbilical hernia containing fat 4.  Aortic Atherosclerosis (ICD10-I70.0). Electronically Signed   By: Margarette Canada M.D.   On: 06/02/2018 23:28   Dg Abd Portable 1v  Result Date: 06/04/2018 CLINICAL DATA:  Orogastric tube placement EXAM: PORTABLE ABDOMEN - 1 VIEW COMPARISON:  CT abdomen and pelvis June 02, 2018 FINDINGS: Orogastric tube tip and side port are in the proximal stomach. Note that the left hemidiaphragm is elevated in the stomach is unusually superior in position as is demonstrated on recent CT. There is moderate stool in the colon. There is no bowel dilatation or air-fluid level to suggest bowel obstruction. No free air. There is degenerative change in the lower thoracic and lumbar spine regions. IMPRESSION: Orogastric tube tip and side port in proximal stomach. Stomach is present inferior to an elevated left hemidiaphragm. No bowel obstruction or free air evident. Moderate stool noted in colon. Electronically Signed   By: Lowella Grip III M.D.   On: 06/04/2018 14:39     Phillips Climes M.D on 06/04/2018 at 4:19 PM  Between 7am to 7pm - Pager - [phone]  After 7pm go to [email or web address] - password [email or web address]  [facility] -  Office  [phone]

## 2018-06-05 ENCOUNTER — Inpatient Hospital Stay (HOSPITAL_COMMUNITY): Payer: Medicare Other | Admitting: Certified Registered Nurse Anesthetist

## 2018-06-05 ENCOUNTER — Encounter (HOSPITAL_COMMUNITY): Admission: EM | Disposition: A | Discharge status: Skilled Nursing Facility | Payer: Self-pay | Source: Home / Self Care

## 2018-06-05 ENCOUNTER — Inpatient Hospital Stay: Payer: Self-pay

## 2018-06-05 ENCOUNTER — Encounter (HOSPITAL_COMMUNITY): Payer: Self-pay

## 2018-06-05 DIAGNOSIS — N390 Urinary tract infection, site not specified: Secondary | ICD-10-CM

## 2018-06-05 HISTORY — PX: ESOPHAGOGASTRODUODENOSCOPY (EGD) WITH PROPOFOL: SHX5813

## 2018-06-05 LAB — BASIC METABOLIC PANEL
ANION GAP: 7 (ref 5–15)
BUN: 27 mg/dL — ABNORMAL HIGH (ref 8–23)
CALCIUM: 8.3 mg/dL — AB (ref 8.9–10.3)
CO2: 28 mmol/L (ref 22–32)
Chloride: 105 mmol/L (ref 98–111)
Creatinine, Ser: 0.72 mg/dL (ref 0.44–1.00)
GLUCOSE: 93 mg/dL (ref 70–99)
POTASSIUM: 2.9 mmol/L — AB (ref 3.5–5.1)
Sodium: 140 mmol/L (ref 135–145)

## 2018-06-05 LAB — MAGNESIUM: MAGNESIUM: 2.2 mg/dL (ref 1.7–2.4)

## 2018-06-05 LAB — CBC
HEMATOCRIT: 31.1 % — AB (ref 36.0–46.0)
Hemoglobin: 10.1 g/dL — ABNORMAL LOW (ref 12.0–15.0)
MCH: 29.7 pg (ref 26.0–34.0)
MCHC: 32.5 g/dL (ref 30.0–36.0)
MCV: 91.5 fL (ref 78.0–100.0)
PLATELETS: 320 10*3/uL (ref 150–400)
RBC: 3.4 MIL/uL — AB (ref 3.87–5.11)
RDW: 12.9 % (ref 11.5–15.5)
WBC: 10.3 10*3/uL (ref 4.0–10.5)

## 2018-06-05 LAB — URINE CULTURE

## 2018-06-05 LAB — PHOSPHORUS: PHOSPHORUS: 2.8 mg/dL (ref 2.5–4.6)

## 2018-06-05 SURGERY — ESOPHAGOGASTRODUODENOSCOPY (EGD) WITH PROPOFOL
Anesthesia: Monitor Anesthesia Care

## 2018-06-05 MED ORDER — LACTATED RINGERS IV SOLN
INTRAVENOUS | Status: DC | PRN
  Administered 2018-06-05: 09:00:00 via INTRAVENOUS

## 2018-06-05 MED ORDER — LIDOCAINE HCL (CARDIAC) PF 100 MG/5ML IV SOSY
PREFILLED_SYRINGE | INTRAVENOUS | Status: DC | PRN
  Administered 2018-06-05: 60 mg via INTRAVENOUS

## 2018-06-05 MED ORDER — BISACODYL 10 MG RE SUPP
10.0000 mg | Freq: Two times a day (BID) | RECTAL | Status: DC
  Administered 2018-06-05 – 2018-06-06 (×3): 10 mg via RECTAL
  Filled 2018-06-05 (×4): qty 1

## 2018-06-05 MED ORDER — POTASSIUM CHLORIDE 10 MEQ/100ML IV SOLN
INTRAVENOUS | Status: AC
  Administered 2018-06-05: 10 meq via INTRAVENOUS
  Filled 2018-06-05: qty 100

## 2018-06-05 MED ORDER — SODIUM CHLORIDE 0.9 % IV SOLN
INTRAVENOUS | Status: DC
  Administered 2018-06-05 – 2018-06-15 (×6): via INTRAVENOUS

## 2018-06-05 MED ORDER — SODIUM CHLORIDE 0.9% FLUSH
10.0000 mL | INTRAVENOUS | Status: DC | PRN
  Administered 2018-06-06: 10 mL
  Filled 2018-06-05: qty 40

## 2018-06-05 MED ORDER — PROPOFOL 500 MG/50ML IV EMUL
INTRAVENOUS | Status: DC | PRN
  Administered 2018-06-05: 75 ug/kg/min via INTRAVENOUS

## 2018-06-05 MED ORDER — HEPARIN SODIUM (PORCINE) 5000 UNIT/ML IJ SOLN
5000.0000 [IU] | Freq: Three times a day (TID) | INTRAMUSCULAR | Status: DC
  Administered 2018-06-05 – 2018-06-17 (×34): 5000 [IU] via SUBCUTANEOUS
  Filled 2018-06-05 (×33): qty 1

## 2018-06-05 MED ORDER — POTASSIUM CHLORIDE 10 MEQ/100ML IV SOLN
10.0000 meq | INTRAVENOUS | Status: AC
  Administered 2018-06-05 (×5): 10 meq via INTRAVENOUS
  Filled 2018-06-05 (×5): qty 100

## 2018-06-05 MED ORDER — SODIUM CHLORIDE 0.9 % IV SOLN: INTRAVENOUS | Status: DC

## 2018-06-05 MED ORDER — PANTOPRAZOLE SODIUM 40 MG IV SOLR
40.0000 mg | Freq: Two times a day (BID) | INTRAVENOUS | Status: DC
  Administered 2018-06-05 – 2018-06-24 (×39): 40 mg via INTRAVENOUS
  Filled 2018-06-05 (×40): qty 40

## 2018-06-05 MED ORDER — POTASSIUM CHLORIDE 10 MEQ/100ML IV SOLN
10.0000 meq | Freq: Once | INTRAVENOUS | Status: AC
  Administered 2018-06-05: 10 meq via INTRAVENOUS

## 2018-06-05 MED ORDER — PROPOFOL 10 MG/ML IV BOLUS
INTRAVENOUS | Status: DC | PRN
  Administered 2018-06-05 (×2): 10 mg via INTRAVENOUS

## 2018-06-05 SURGICAL SUPPLY — 15 items

## 2018-06-05 NOTE — Progress Notes (Signed)
PROGRESS NOTE                                                                                                                                                                                                             Patient Demographics:    [name], is a 84 y.o. female, DOB - [birth year], PJK:[id number]  Admit date - 06/02/2018   Admitting Physician Ivor Costa, MD  Outpatient Primary MD for the patient is Deland Pretty, MD  LOS - 1   Chief Complaint  Patient presents with  . Chest Pain  . Abdominal Pain       Brief Narrative    [name] is a 84 y.o. female with medical history significant of hypertension, hyperlipidemia, GERD, mitral valve regurg, dCHF, hiatal hernia, who presents with intractable nausea, vomiting, abdominal pain, and chest pain. Work-up significant for urinary retention, mild UTI, she continues to have nausea and vomiting during hospital stay, apparently this is secondary to large hiatal hernia most of the stomach and her sac, and continues to have persistent nausea vomiting, coffee-ground emesis, improved after NGT insertion with suction, went for endoscopy 06/05/2018, significant for large type III hiatal hernia, with inflammation and erosion, but no evidence of ulcer or active bleeding, possibility of partial torsion cannot be ruled out, as well advancing the scope into the gastric antrum was difficult , patient is followed by general surgery as well .     Subjective:    [name] today for she is feeling better after NGT insertion with suction, denies any nausea or vomiting overnight,    Assessment  & Plan :    Principal Problem:   Hiatal hernia Active Problems:   Leukocytosis   Essential hypertension, benign   Chest pain   Abdominal pain   GERD (gastroesophageal reflux disease)   Elevated lactic acid level   Chronic diastolic CHF (congestive heart failure) (HCC)   SIRS (systemic inflammatory response  syndrome) (HCC)    Intractable nausea and vomiting secondary to large type III hiatal hernia -Elderly, frail, provement with n.p.o. status, required NGT insertion with significant improvement of symptoms. -ED by [facility] GI 06/05/2018 significant for significant for large type III hiatal hernia, with inflammation and erosion, but no evidence of ulcer or active bleeding, possibility of partial torsion cannot be ruled out, as well advancing the  scope into the gastric antrum was difficult, with narrowing of stomach body at hiatal hernia side, likely causing some sort of obstruction. -Continue with n.p.o. status for now, continue with IV fluids, continue with GT on ILS, with Protonix IV twice daily. -General Surgery on board.  Chest pain with elevated troponins: -cardiology nput greatly appreciated, this is most likely in the setting of mild ischemia, 2D echo with normal EF, and without any regional wall motion abnormalities, no further work-up per cardiology . -pt is allergic to ASA -she  had 8-second run of NSVT yesterday, she has hypokalemia, will check magnesium and replete electrolyte, continue with telemetry monitoring  UTI/urinary retention -Leukocytosis most likely related to her vomiting, she had elevated lactic acid as well, work-up significant for UTI urine culture growing E. coli, continue with Rocephin . -Patient had urinary retention required Foley catheter insertion  Essential hypertension: -Continue to hold home meds as she is n.p.o., continue with management with PRN hydralazine for now  GERD (gastroesophageal reflux disease) -Ground emesis, but no evidence of bleed ulcer or gastritis on endoscopy  Chronic diastolic CHF (congestive heart failure) ([facility]):  -2D echo on 05/06/2014 showed EF of 55 with grade 1 diastolic dysfunction.  Patient does not have leg edema.  No shortness of breath.  CHF seems to be compensated. - Hold spironolactone  Hypokalemia -Recheck magnesium and  phosphorus      Code Status : DNR  Family Communication  : Discussed with daughter via phone 06/04/2018  Disposition Plan  : SNF  Consults  :  cardiology, GI, general surgery  Procedures  : none  DVT Prophylaxis  : Resume on subcu heparin given no evidence of GI bleed on endoscopy  Lab Results  Component Value Date   PLT 320 06/05/2018    Antibiotics  :    Anti-infectives (From admission, onward)   Start     Dose/Rate Route Frequency Ordered Stop   06/03/18 0900  ciprofloxacin (CIPRO) IVPB 400 mg  Status:  Discontinued     400 mg 200 mL/hr over 60 Minutes Intravenous Every 12 hours 06/03/18 0757 06/03/18 0759   06/03/18 0800  [MAR Hold]  cefTRIAXone (ROCEPHIN) 1 g in sodium chloride 0.9 % 100 mL IVPB     (MAR Hold since Sun 06/05/2018 at 0838. Reason: Transfer to a Procedural area.)   1 g 200 mL/hr over 30 Minutes Intravenous Every 24 hours 06/03/18 0759          Objective:   Vitals:   06/05/18 0534 06/05/18 0840 06/05/18 0956 06/05/18 1005  BP: 117/68 (!) 143/72 (!) 152/68 (!) 155/70  Pulse: 74 75 78 78  Resp: 16 15 14 15   Temp: 98.2 F (36.8 C) 97.7 F (36.5 C) 98.1 F (36.7 C)   TempSrc: Oral Oral Oral   SpO2: 93% 94% 96% 97%  Weight:      Height:        Wt Readings from Last 3 Encounters:  06/03/18 69.1 kg (152 lb 5.4 oz)  03/22/18 73.9 kg (163 lb)  10/06/16 70.9 kg (156 lb 6.4 oz)     Intake/Output Summary (Last 24 hours) at 06/05/2018 1017 Last data filed at 06/05/2018 0956 Gross per 24 hour  Intake 1629.57 ml  Output 2250 ml  Net -620.43 ml     Physical Exam Frail chronically ill-appearing female, laying in bed this a.m., denies any complaints (she was seen in early a.m. before her endoscopy ) Respiratory distress, good air entry bilaterally Regular rate and rhythm,  no rubs murmurs gallops,(limited was noted for few PVCs, 8 seconds of NSVT) Has some epigastric tenderness to palpation, otherwise no rebound no guarding, bowel sounds  present. Extremities with no edema, clubbing or cyanosis   Data Review:    CBC Recent Labs  Lab 06/02/18 2053 06/03/18 0606 06/04/18 0640 06/04/18 1132 06/05/18 0615  WBC 16.5* 18.0* 16.0* 17.8* 10.3  HGB 12.0 11.7* 12.2 11.5* 10.1*  HCT 36.4 35.7* 36.8 34.8* 31.1*  PLT 432* 381 370 352 320  MCV 90.1 89.5 89.8 89.7 91.5  MCH 29.7 29.3 29.8 29.6 29.7  MCHC 33.0 32.8 33.2 33.0 32.5  RDW 12.5 12.5 12.8 12.7 12.9    Chemistries  Recent Labs  Lab 06/02/18 2053 06/02/18 2234 06/03/18 0606 06/04/18 0640 06/05/18 0615  NA 132*  --  135 137 140  K 4.3  --  4.0 3.4* 2.9*  CL 94*  --  97* 98 105  CO2 24  --  26 26 28   GLUCOSE 162*  --  139* 138* 93  BUN 17  --  14 15 27*  CREATININE 0.80  --  0.75 0.80 0.72  CALCIUM 9.2  --  8.8* 9.2 8.3*  AST  --  23  --   --   --   ALT  --  14  --   --   --   ALKPHOS  --  78  --   --   --   BILITOT  --  0.7  --   --   --    ------------------------------------------------------------------------------------------------------------------ Recent Labs    06/03/18 0606  CHOL 187  HDL 51  LDLCALC 128*  TRIG 38  CHOLHDL 3.7    Lab Results  Component Value Date   HGBA1C 5.5 06/03/2018   ------------------------------------------------------------------------------------------------------------------ No results for input(s): TSH, T4TOTAL, T3FREE, THYROIDAB in the last 72 hours.  Invalid input(s): FREET3 ------------------------------------------------------------------------------------------------------------------ No results for input(s): VITAMINB12, FOLATE, FERRITIN, TIBC, IRON, RETICCTPCT in the last 72 hours.  Coagulation profile Recent Labs  Lab 06/04/18 1513  INR 1.21    No results for input(s): DDIMER in the last 72 hours.  Cardiac Enzymes Recent Labs  Lab 06/03/18 0254 06/03/18 0606 06/03/18 1204  TROPONINI 0.05* 0.07* 0.11*    ------------------------------------------------------------------------------------------------------------------    Component Value Date/Time   BNP 567.9 (H) 06/03/2018 0254    Inpatient Medications  Scheduled Meds: . [MAR Hold] metoCLOPramide (REGLAN) injection  5 mg Intravenous Q8H  . pantoprazole  40 mg Intravenous Q12H   Continuous Infusions: . sodium chloride 75 mL/hr at 06/04/18 1735  . sodium chloride    . [MAR Hold] cefTRIAXone (ROCEPHIN)  IV 1 g (06/04/18 1530)  . potassium chloride     PRN Meds:.[MAR Hold] acetaminophen, [MAR Hold] fentaNYL (SUBLIMAZE) injection, [MAR Hold] hydrALAZINE, [MAR Hold] nitroGLYCERIN, [MAR Hold] ondansetron (ZOFRAN) IV  Micro Results Recent Results (from the past 240 hour(s))  Culture, blood (x 2)     Status: None (Preliminary result)   Collection Time: 06/03/18  2:30 AM  Result Value Ref Range Status   Specimen Description BLOOD RIGHT ARM  Final   Special Requests   Final    BOTTLES DRAWN AEROBIC AND ANAEROBIC Blood Culture adequate volume   Culture   Final    NO GROWTH 1 DAY Performed at [facility], [place] [address] [place], [place] [address]    Report Status PENDING  Incomplete  Culture, blood (x 2)     Status: None (Preliminary result)   Collection Time:  06/03/18  2:50 AM  Result Value Ref Range Status   Specimen Description BLOOD LEFT ARM  Final   Special Requests   Final    BOTTLES DRAWN AEROBIC AND ANAEROBIC Blood Culture adequate volume   Culture  Setup Time   Final    AEROBIC BOTTLE ONLY GRAM POSITIVE COCCI Organism ID to follow CRITICAL RESULT CALLED TO, READ BACK BY AND VERIFIED WITH: PHARMD N BATCHELDER 06/04/18 AT 812 BY CM Performed at [facility], [place] [address] [place], [place] [address]    Culture GRAM POSITIVE COCCI  Final   Report Status PENDING  Incomplete  Blood Culture ID Panel (Reflexed)     Status: Abnormal   Collection Time: 06/03/18  2:50 AM  Result Value Ref Range Status    Enterococcus species NOT DETECTED NOT DETECTED Final   Listeria monocytogenes NOT DETECTED NOT DETECTED Final   Staphylococcus species DETECTED (A) NOT DETECTED Final    Comment: Methicillin (oxacillin) susceptible coagulase negative staphylococcus. Possible blood culture contaminant (unless isolated from more than one blood culture draw or clinical case suggests pathogenicity). No antibiotic treatment is indicated for blood  culture contaminants. CRITICAL RESULT CALLED TO, READ BACK BY AND VERIFIED WITH: PHARMD N BETCHELDER 06/04/18 AT 812 BY CM    Staphylococcus aureus NOT DETECTED NOT DETECTED Final   Methicillin resistance NOT DETECTED NOT DETECTED Final   Streptococcus species NOT DETECTED NOT DETECTED Final   Streptococcus agalactiae NOT DETECTED NOT DETECTED Final   Streptococcus pneumoniae NOT DETECTED NOT DETECTED Final   Streptococcus pyogenes NOT DETECTED NOT DETECTED Final   Acinetobacter baumannii NOT DETECTED NOT DETECTED Final   Enterobacteriaceae species NOT DETECTED NOT DETECTED Final   Enterobacter cloacae complex NOT DETECTED NOT DETECTED Final   Escherichia coli NOT DETECTED NOT DETECTED Final   Klebsiella oxytoca NOT DETECTED NOT DETECTED Final   Klebsiella pneumoniae NOT DETECTED NOT DETECTED Final   Proteus species NOT DETECTED NOT DETECTED Final   Serratia marcescens NOT DETECTED NOT DETECTED Final   Haemophilus influenzae NOT DETECTED NOT DETECTED Final   Neisseria meningitidis NOT DETECTED NOT DETECTED Final   Pseudomonas aeruginosa NOT DETECTED NOT DETECTED Final   Candida albicans NOT DETECTED NOT DETECTED Final   Candida glabrata NOT DETECTED NOT DETECTED Final   Candida krusei NOT DETECTED NOT DETECTED Final   Candida parapsilosis NOT DETECTED NOT DETECTED Final   Candida tropicalis NOT DETECTED NOT DETECTED Final    Comment: Performed at [facility], [place]. [address], [place], [place] [address]  MRSA PCR Screening     Status: None   Collection Time:  06/03/18  4:54 AM  Result Value Ref Range Status   MRSA by PCR NEGATIVE NEGATIVE Final    Comment:        The GeneXpert MRSA Assay (FDA approved for NASAL specimens only), is one component of a comprehensive MRSA colonization surveillance program. It is not intended to diagnose MRSA infection nor to guide or monitor treatment for MRSA infections. Performed at [facility], [place] [address], [place], [place] [address]   Culture, Urine     Status: Abnormal   Collection Time: 06/03/18 10:45 AM  Result Value Ref Range Status   Specimen Description URINE, CATHETERIZED  Final   Special Requests   Final    NONE Performed at [facility], [place] [address] [place],  [address]    Culture 30,000 COLONIES/mL ESCHERICHIA COLI (A)  Final   Report Status 06/05/2018 FINAL  Final   Organism ID, Bacteria ESCHERICHIA COLI (A)  Final      Susceptibility   Escherichia coli - MIC*    AMPICILLIN >=32 RESISTANT Resistant     CEFAZOLIN >=64 RESISTANT Resistant     CEFTRIAXONE <=1 SENSITIVE Sensitive     CIPROFLOXACIN <=0.25 SENSITIVE Sensitive     GENTAMICIN <=1 SENSITIVE Sensitive     IMIPENEM <=0.25 SENSITIVE Sensitive     NITROFURANTOIN 32 SENSITIVE Sensitive     TRIMETH/SULFA >=320 RESISTANT Resistant     AMPICILLIN/SULBACTAM >=32 RESISTANT Resistant     PIP/TAZO >=128 RESISTANT Resistant     Extended ESBL NEGATIVE Sensitive     * 30,000 COLONIES/mL ESCHERICHIA COLI    Radiology Reports Dg Chest 2 View  Result Date: 06/02/2018 CLINICAL DATA:  Chest pain and vomiting. EXAM: CHEST - 2 VIEW COMPARISON:  03/19/2018 and chest CT 08/05/2015 FINDINGS: Lungs are somewhat hypoinflated demonstrate mild opacification over the left base which may be due to small amount of pleural fluid/atelectasis. Mild prominence of the perihilar markings suggesting mild degree of vascular congestion. Mild stable cardiomegaly. Stable elevation of the left hemidiaphragm with large hiatal hernia unchanged.  Remainder of the exam is unchanged. IMPRESSION: Mild cardiomegaly with suggestion of mild vascular congestion. Mild left base opacification likely small effusion with atelectasis. Large hiatal hernia unchanged. Electronically Signed   By: Marin Olp M.D.   On: 06/02/2018 21:42   Ct Abdomen Pelvis W Contrast  Result Date: 06/02/2018 CLINICAL DATA:  84 year old female with acute abdominal pain and vomiting today. EXAM: CT ABDOMEN AND PELVIS WITH CONTRAST TECHNIQUE: Multidetector CT imaging of the abdomen and pelvis was performed using the standard protocol following bolus administration of intravenous contrast. CONTRAST:  153mL OMNIPAQUE IOHEXOL 300 MG/ML  SOLN COMPARISON:  05/20/2016 and prior CTs FINDINGS: Lower chest: LEFT basilar atelectasis noted. Hepatobiliary: The liver and gallbladder are unremarkable. Pancreas: Unremarkable Spleen: Unremarkable Adrenals/Urinary Tract: Moderate to marked distention of bladder again noted. Kidneys and adrenal glands are unremarkable. Stomach/Bowel: A large hiatal hernia is noted with a distended stomach in the LOWER LEFT chest/UPPER abdomen-but unchanged in appearance and configuration since 08/05/2015 No dilated small bowel loops identified. No colonic abnormalities are identified. Vascular/Lymphatic: Aortic atherosclerosis. No enlarged abdominal or pelvic lymph nodes. Reproductive: Uterus and bilateral adnexa are unremarkable. Other: No ascites, pneumoperitoneum or abscess. A moderate paraumbilical hernia containing fat is present. Musculoskeletal: No acute abnormalities identified. Lumbar scoliosis and moderate to severe multilevel degenerative changes again noted. LEFT hip arthroplasty again identified. IMPRESSION: 1. Large hiatal hernia with distended stomach in the LOWER LEFT chest/UPPER abdomen with unchanged appearance and configuration of the stomach when compared to 08/05/2015. 2. Distended bladder. 3. Moderate paraumbilical hernia containing fat 4.  Aortic  Atherosclerosis (ICD10-I70.0). Electronically Signed   By: Margarette Canada M.D.   On: 06/02/2018 23:28   Dg Abd Portable 1v  Result Date: 06/04/2018 CLINICAL DATA:  Orogastric tube placement EXAM: PORTABLE ABDOMEN - 1 VIEW COMPARISON:  CT abdomen and pelvis June 02, 2018 FINDINGS: Orogastric tube tip and side port are in the proximal stomach. Note that the left hemidiaphragm is elevated in the stomach is unusually superior in position as is demonstrated on recent CT. There is moderate stool in the colon. There is no bowel dilatation or air-fluid level to suggest bowel obstruction. No free air. There is degenerative change in the lower thoracic and lumbar spine regions. IMPRESSION: Orogastric tube tip and side port in proximal stomach. Stomach is present inferior to an elevated left  hemidiaphragm. No bowel obstruction or free air evident. Moderate stool noted in colon. Electronically Signed   By: Lowella Grip III M.D.   On: 06/04/2018 14:39     Phillips Climes M.D on 06/05/2018 at 10:17 AM  Between 7am to 7pm - Pager - [phone]  After 7pm go to [email or web address] - password [facility]  [facility] -  Office  [phone]

## 2018-06-05 NOTE — Interval H&P Note (Signed)
History and Physical Interval Note:  06/05/2018 9:01 AM  [name]  has presented today for surgery, with the diagnosis of Coffee-ground emesis, large hiatal hernia  The various methods of treatment have been discussed with the patient and family. After consideration of risks, benefits and other options for treatment, the patient has consented to  Procedure(s): ESOPHAGOGASTRODUODENOSCOPY (EGD) WITH PROPOFOL (N/A) as a surgical intervention .  The patient's history has been reviewed, patient examined, no change in status, stable for surgery.  I have reviewed the patient's chart and labs.  Questions were answered to the patient's satisfaction.     Risks (bleeding, infection, bowel perforation that could require surgery, sedation-related changes in cardiopulmonary systems), benefits (identification and possible treatment of source of symptoms, exclusion of certain causes of symptoms), and alternatives (watchful waiting, radiographic imaging studies, empiric medical treatment)  were explained to patient/family in detail and patient wishes to proceed. [name]

## 2018-06-05 NOTE — Progress Notes (Signed)
Peripherally Inserted Central Catheter/Midline Placement  The IV Nurse has discussed with the patient and/or persons authorized to consent for the patient, the purpose of this procedure and the potential benefits and risks involved with this procedure.  The benefits include less needle sticks, lab draws from the catheter, and the patient may be discharged home with the catheter. Risks include, but not limited to, infection, bleeding, blood clot (thrombus formation), and puncture of an artery; nerve damage and irregular heartbeat and possibility to perform a PICC exchange if needed/ordered by physician.  Alternatives to this procedure were also discussed.  Bard Power PICC patient education guide, fact sheet on infection prevention and patient information card has been provided to patient /or left at bedside.    PICC/Midline Placement Documentation  PICC Double Lumen 63/87/56 PICC Right Basilic 38 cm 0 cm (Active)  Indication for Insertion or Continuance of Line Administration of hyperosmolar/irritating solutions (i.e. TPN, Vancomycin, etc.) 06/05/2018  6:08 PM  Exposed Catheter (cm) 0 cm 06/05/2018  6:08 PM  Site Assessment Clean;Dry;Intact 06/05/2018  6:08 PM  Lumen #1 Status Flushed;Blood return noted 06/05/2018  6:08 PM  Lumen #2 Status Flushed;Blood return noted;Saline locked 06/05/2018  6:08 PM  Dressing Type Transparent 06/05/2018  6:08 PM  Dressing Status Dry;Clean;Intact 06/05/2018  6:08 PM  Dressing Intervention New dressing 06/05/2018  6:08 PM  Dressing Change Due 06/12/18 06/05/2018  6:08 PM       [address] 06/05/2018, 6:09 PM

## 2018-06-05 NOTE — Op Note (Signed)
[facility] Patient Name: [name] Procedure Date : 06/05/2018 MRN: [id number] Attending MD: Otis Brace , MD Date of Birth: 11-05-1926 CSN: [id number] Age: 84 Admit Type: Inpatient Procedure:                Upper GI endoscopy Indications:              Coffee-ground emesis, large hiatal hernia Providers:                Otis Brace, MD, Burtis Junes, RN, Charolette Child,                            Technician Referring MD:              Medicines:                Sedation Administered by an Anesthesia Professional Complications:            No immediate complications. Estimated Blood Loss:     Estimated blood loss: none. Procedure:                Pre-Anesthesia Assessment:                           - Prior to the procedure, a History and Physical                            was performed, and patient medications and                            allergies were reviewed. The patient's tolerance of                            previous anesthesia was also reviewed. The risks                            and benefits of the procedure and the sedation                            options and risks were discussed with the patient.                            All questions were answered, and informed consent                            was obtained. Prior Anticoagulants: The patient has                            taken no previous anticoagulant or antiplatelet                            agents. ASA Grade Assessment: III - A patient with                            severe systemic disease. After reviewing the risks  and benefits, the patient was deemed in                            satisfactory condition to undergo the procedure.                           After obtaining informed consent, the endoscope was                            passed under direct vision. Throughout the                            procedure, the patient's blood pressure, pulse, and                      oxygen saturations were monitored continuously. The                            [id number] ) was introduced through the                            mouth, and advanced to the second part of duodenum.                            The upper GI endoscopy was technically difficult                            and complex due to abnormal anatomy. The patient                            tolerated the procedure well. Scope In: Scope Out: Findings:      A large type-III paraesophageal hernia was found. The Z-line was 30 cm       from the incisors. there was erosions and petechiae in the hernia sac       probably from partial torsion. Advancing scope to the gastric antrum was       difficult. Large amount of retained food noted in the hernia sac which       was suctioned. NG tube was kept in place. No evidence of ulcer or active       bleeding.      Normal mucosa was found in the entire examined stomach. poor distention       of the body and fundus of the stomach probably from large hiatal hernia.      The duodenal bulb, first portion of the duodenum and second portion of       the duodenum were normal. Impression:               - Large type-III paraesophageal hernia. possible                            partial torsion cannot be ruled out                           - Normal mucosa was found in the entire stomach. no  evidence of active bleeding or ulcer disease.                           - Normal duodenal bulb, first portion of the                            duodenum and second portion of the duodenum.                           - No specimens collected. Recommendation:           - Return patient to hospital ward for ongoing care.                           - NPO.                           - Continue present medications. Procedure Code(s):        --- Professional ---                           [phone], Esophagogastroduodenoscopy, flexible,                             transoral; diagnostic, including collection of                            specimen(s) by brushing or washing, when performed                            (separate procedure) Diagnosis Code(s):        --- Professional ---                           K44.9, Diaphragmatic hernia without obstruction or                            gangrene                           K92.0, Hematemesis CPT copyright 2017 [employer]. All rights reserved. The codes documented in this report are preliminary and upon coder review may  be revised to meet current compliance requirements. Otis Brace, MD Otis Brace, MD 06/05/2018 9:55:25 AM Number of Addenda: 0

## 2018-06-05 NOTE — Anesthesia Preprocedure Evaluation (Signed)
Anesthesia Evaluation  Patient identified by MRN, date of birth, ID band Patient awake    Reviewed: Allergy & Precautions, NPO status , Patient's Chart, lab work & pertinent test results  Airway Mallampati: II  TM Distance: >3 FB     Dental  (+) Edentulous Upper, Partial Lower   Pulmonary former smoker,     + decreased breath sounds      Cardiovascular hypertension,  Rhythm:Regular Rate:Normal     Neuro/Psych    GI/Hepatic   Endo/Other    Renal/GU      Musculoskeletal   Abdominal ND tube in place  Peds  Hematology   Anesthesia Other Findings   Reproductive/Obstetrics                             Anesthesia Physical Anesthesia Plan  ASA: III  Anesthesia Plan: MAC   Post-op Pain Management:    Induction: Intravenous  PONV Risk Score and Plan: Propofol infusion  Airway Management Planned: Natural Airway and Nasal Cannula  Additional Equipment:   Intra-op Plan:   Post-operative Plan:   Informed Consent: I have reviewed the patients History and Physical, chart, labs and discussed the procedure including the risks, benefits and alternatives for the proposed anesthesia with the patient or authorized representative who has indicated his/her understanding and acceptance.     Plan Discussed with: CRNA and Anesthesiologist  Anesthesia Plan Comments:         Anesthesia Quick Evaluation

## 2018-06-05 NOTE — Anesthesia Procedure Notes (Signed)
Procedure Name: MAC Date/Time: 06/05/2018 9:25 AM Performed by: Oletta Lamas, CRNA Pre-anesthesia Checklist: Patient identified, Suction available, Emergency Drugs available, Patient being monitored and Timeout performed Patient Re-evaluated:Patient Re-evaluated prior to induction Oxygen Delivery Method: Nasal cannula

## 2018-06-05 NOTE — Anesthesia Postprocedure Evaluation (Signed)
Anesthesia Post Note  Patient: [name]  Procedure(s) Performed: ESOPHAGOGASTRODUODENOSCOPY (EGD) WITH PROPOFOL (N/A )     Patient location during evaluation: PACU Anesthesia Type: MAC Level of consciousness: awake and alert Pain management: pain level controlled Vital Signs Assessment: post-procedure vital signs reviewed and stable Respiratory status: spontaneous breathing, nonlabored ventilation, respiratory function stable and patient connected to nasal cannula oxygen Cardiovascular status: stable and blood pressure returned to baseline Postop Assessment: no apparent nausea or vomiting Anesthetic complications: no    Last Vitals:  Vitals:   06/05/18 0956 06/05/18 1005  BP: (!) 152/68 (!) 155/70  Pulse: 78 78  Resp: 14 15  Temp: 36.7 C   SpO2: 96% 97%    Last Pain:  Vitals:   06/05/18 1005  TempSrc:   PainSc: 0-No pain                 Jermarion Poffenberger COKER

## 2018-06-05 NOTE — Transfer of Care (Signed)
Immediate Anesthesia Transfer of Care Note  Patient: [name]  Procedure(s) Performed: ESOPHAGOGASTRODUODENOSCOPY (EGD) WITH PROPOFOL (N/A )  Patient Location: Endo   Anesthesia Type:MAC  Level of Consciousness: oriented, drowsy and patient cooperative  Airway & Oxygen Therapy: Patient Spontanous Breathing and Patient connected to nasal cannula oxygen  Post-op Assessment: Report given to RN and Post -op Vital signs reviewed and stable  Post vital signs: Reviewed and stable  Last Vitals:  Vitals Value Taken Time  BP    Temp    Pulse 79 06/05/2018  9:55 AM  Resp 16 06/05/2018  9:55 AM  SpO2 97 % 06/05/2018  9:55 AM  Vitals shown include unvalidated device data.  Last Pain:  Vitals:   06/05/18 0840  TempSrc: Oral  PainSc: 0-No pain         Complications: No apparent anesthesia complications

## 2018-06-05 NOTE — Brief Op Note (Signed)
06/02/2018 - 06/05/2018  9:55 AM  PATIENT:  [name]  84 y.o. female  PRE-OPERATIVE DIAGNOSIS:  Coffee-ground emesis, large hiatal hernia  POST-OPERATIVE DIAGNOSIS:  large hiatal hernia, torsion of stomach, no active bleeding  PROCEDURE:  Procedure(s): ESOPHAGOGASTRODUODENOSCOPY (EGD) WITH PROPOFOL (N/A)  SURGEON:  Surgeon(s) and Role:    * Raizy Auzenne, MD - Primary  Findings ---------- - EGD showed large type III hiatal hernia with inflammation and erosion in the fundus. Possibility of partial torsion cannot be ruled out. Large amount of retained food in the hernia sac was suctioned. No evidence of ulcer or active bleeding. Advancing scope into the gastric antrum was difficult   Recommendations -------------------------- - Nothing by mouth for now - Continue NG suction - continue PPI - Further management per surgery  - GI will sign off. Call us back if needed  Otis Brace MD, [place] 06/05/2018, 9:56 AM  Contact #  [phone]

## 2018-06-06 LAB — BASIC METABOLIC PANEL
Anion gap: 8 (ref 5–15)
BUN: 25 mg/dL — AB (ref 8–23)
CHLORIDE: 107 mmol/L (ref 98–111)
CO2: 26 mmol/L (ref 22–32)
Calcium: 8.1 mg/dL — ABNORMAL LOW (ref 8.9–10.3)
Creatinine, Ser: 0.7 mg/dL (ref 0.44–1.00)
GFR calc non Af Amer: >60 mL/min (ref >60)
Glucose, Bld: 77 mg/dL (ref 70–99)
POTASSIUM: 3.1 mmol/L — AB (ref 3.5–5.1)
Sodium: 141 mmol/L (ref 135–145)

## 2018-06-06 LAB — GLUCOSE, CAPILLARY: GLUCOSE-CAPILLARY: 86 mg/dL (ref 70–99)

## 2018-06-06 LAB — CBC
HEMATOCRIT: 30.3 % — AB (ref 36.0–46.0)
HEMOGLOBIN: 9.6 g/dL — AB (ref 12.0–15.0)
MCH: 29.5 pg (ref 26.0–34.0)
MCHC: 31.7 g/dL (ref 30.0–36.0)
MCV: 93.2 fL (ref 78.0–100.0)
Platelets: 288 10*3/uL (ref 150–400)
RBC: 3.25 MIL/uL — AB (ref 3.87–5.11)
RDW: 12.9 % (ref 11.5–15.5)
WBC: 11.8 10*3/uL — ABNORMAL HIGH (ref 4.0–10.5)

## 2018-06-06 LAB — CULTURE, BLOOD (ROUTINE X 2): Special Requests: ADEQUATE

## 2018-06-06 MED ORDER — CHLORHEXIDINE GLUCONATE 0.12 % MT SOLN
15.0000 mL | Freq: Two times a day (BID) | OROMUCOSAL | Status: DC
  Administered 2018-06-06 – 2018-06-24 (×30): 15 mL via OROMUCOSAL
  Filled 2018-06-06 (×28): qty 15

## 2018-06-06 MED ORDER — INSULIN ASPART 100 UNIT/ML ~~LOC~~ SOLN
0.0000 [IU] | Freq: Four times a day (QID) | SUBCUTANEOUS | Status: DC
  Administered 2018-06-07: 3 [IU] via SUBCUTANEOUS
  Administered 2018-06-07 (×2): 1 [IU] via SUBCUTANEOUS
  Administered 2018-06-08: 2 [IU] via SUBCUTANEOUS

## 2018-06-06 MED ORDER — TRAVASOL 10 % IV SOLN
INTRAVENOUS | Status: AC
  Administered 2018-06-06: 18:00:00 via INTRAVENOUS
  Filled 2018-06-06: qty 410.4

## 2018-06-06 MED ORDER — SODIUM CHLORIDE 0.9 % IV SOLN
INTRAVENOUS | Status: AC
  Administered 2018-06-06: 19:00:00 via INTRAVENOUS

## 2018-06-06 MED ORDER — ORAL CARE MOUTH RINSE
15.0000 mL | Freq: Two times a day (BID) | OROMUCOSAL | Status: DC
  Administered 2018-06-07 – 2018-06-23 (×16): 15 mL via OROMUCOSAL

## 2018-06-06 MED ORDER — POTASSIUM CHLORIDE 10 MEQ/50ML IV SOLN
10.0000 meq | INTRAVENOUS | Status: AC
  Administered 2018-06-06 (×6): 10 meq via INTRAVENOUS
  Filled 2018-06-06 (×6): qty 50

## 2018-06-06 MED ORDER — CEFOTETAN DISODIUM-DEXTROSE 2-2.08 GM-%(50ML) IV SOLR
2.0000 g | INTRAVENOUS | Status: DC
  Administered 2018-06-07: 2 g via INTRAVENOUS
  Filled 2018-06-06: qty 50

## 2018-06-06 NOTE — Progress Notes (Addendum)
[facility]/Trauma Progress Note  1 Day Post-Op   Assessment/Plan Principal Problem:   Hiatal hernia Active Problems:   Leukocytosis   Essential hypertension, benign   Chest pain   Abdominal pain   GERD (gastroesophageal reflux disease)   Elevated lactic acid level   Chronic diastolic CHF (congestive heart failure) (HCC)   SIRS (systemic inflammatory response syndrome) (HCC)  Large Hiatal hernia  - S/P EGD, 07/07, Dr. Alessandra Bevels, Findings: large type III hiatal hernia with inflammation and erosion in the fundus. Possibility of partial torsion cannot be ruled out, no evidence of ulcer or bleeding. GI has signed off  - Family not at bedside but pt states she wants surgery  - cardiac risk stratification would be helpful prior to any surgical intervention  Discussed with Dr. Landis Gandy. The patient just moved to [place] about 7 weeks ago because of increasing weakness.  I spoke to daughter, Ayjah Show ([phone]) by phone for 20 minutes about her hiatal hernia and went over the surgery, the options, and the risks.  The patient heard the entire conversation. She has a son Clare Gandy, who is in [place] until Friday.  She has another daughter, Haynes Hoehn. Plan to go ahead with hiatal hernia repair and probable gastrostomy tube placement tomorrow.  The patient made a point that she is a DNR, but we talked about perioperative support. She'll probably go to step down ICU post op.  FEN: NGT VTE: SCD's, heparin ID: CIpro 07/05 once, Rocephin 07/05>> for UTI Foley: yes Follow up: TBD  DISPO: Will discuss pt's desire to proceed with surgery with Dr. Lucia Gaskins     LOS: 2 days    Subjective: CC: hiatal hernia  Pt denies abdominal pain, nausea or vomiting. She had a BM but she did not see it so she is unsure of color/consistency. No family at bedside.  Pt states she wants to proceed with surgery. I explained she would have a feeding tube post-op and pt stated that was  okay with that if that is what she needed.  Objective: Vital signs in last 24 hours: Temp:  [97.7 F (36.5 C)-98 F (36.7 C)] 98 F (36.7 C) (07/08 0619) Pulse Rate:  [74-95] 74 (07/08 0619) Resp:  [18] 18 (07/07 1524) BP: (113-136)/(52-83) 113/52 (07/08 0619) SpO2:  [95 %-99 %] 97 % (07/08 0619) Last BM Date: 06/01/18  Intake/Output from previous day: 07/07 0701 - 07/08 0700 In: 1205.2 [I.V.:1105.2; IV Piggyback:100] Out: 450 [Emesis/NG output:450] Intake/Output this shift: Total I/O In: 818.7 [P.O.:220; I.V.:348.7; IV Piggyback:250] Out: 950 [Emesis/NG output:950]  PE: Gen:  Alert, NAD, pleasant, cooperative HEENT: NGT in place, no output in canister  Pulm:  Rate and effort normal Abd: Soft, NT/ND, +BS Skin: no rashes noted, warm and dry   Anti-infectives: Anti-infectives (From admission, onward)   Start     Dose/Rate Route Frequency Ordered Stop   06/03/18 0900  ciprofloxacin (CIPRO) IVPB 400 mg  Status:  Discontinued     400 mg 200 mL/hr over 60 Minutes Intravenous Every 12 hours 06/03/18 0757 06/03/18 0759   06/03/18 0800  cefTRIAXone (ROCEPHIN) 1 g in sodium chloride 0.9 % 100 mL IVPB     1 g 200 mL/hr over 30 Minutes Intravenous Every 24 hours 06/03/18 0759        Lab Results:  Recent Labs    06/05/18 0615 06/06/18 0505  WBC 10.3 11.8*  HGB 10.1* 9.6*  HCT 31.1* 30.3*  PLT 320 288   BMET Recent Labs  06/05/18 0615 06/06/18 0505  NA 140 141  K 2.9* 3.1*  CL 105 107  CO2 28 26  GLUCOSE 93 77  BUN 27* 25*  CREATININE 0.72 0.70  CALCIUM 8.3* 8.1*   PT/INR Recent Labs    06/04/18 1513  LABPROT 15.2  INR 1.21   CMP     Component Value Date/Time   NA 141 06/06/2018 0505   K 3.1 (L) 06/06/2018 0505   CL 107 06/06/2018 0505   CO2 26 06/06/2018 0505   GLUCOSE 77 06/06/2018 0505   BUN 25 (H) 06/06/2018 0505   CREATININE 0.70 06/06/2018 0505   CALCIUM 8.1 (L) 06/06/2018 0505   PROT 7.0 06/02/2018 2234   ALBUMIN 3.6 06/02/2018 2234    AST 23 06/02/2018 2234   ALT 14 06/02/2018 2234   ALKPHOS 78 06/02/2018 2234   BILITOT 0.7 06/02/2018 2234   GFRNONAA >60 06/06/2018 0505   GFRAA >60 06/06/2018 0505   Lipase     Component Value Date/Time   LIPASE 22 06/02/2018 2053    Studies/Results: Dg Abd Portable 1v  Result Date: 06/04/2018 CLINICAL DATA:  Orogastric tube placement EXAM: PORTABLE ABDOMEN - 1 VIEW COMPARISON:  CT abdomen and pelvis June 02, 2018 FINDINGS: Orogastric tube tip and side port are in the proximal stomach. Note that the left hemidiaphragm is elevated in the stomach is unusually superior in position as is demonstrated on recent CT. There is moderate stool in the colon. There is no bowel dilatation or air-fluid level to suggest bowel obstruction. No free air. There is degenerative change in the lower thoracic and lumbar spine regions. IMPRESSION: Orogastric tube tip and side port in proximal stomach. Stomach is present inferior to an elevated left hemidiaphragm. No bowel obstruction or free air evident. Moderate stool noted in colon. Electronically Signed   By: Lowella Grip III M.D.   On: 06/04/2018 14:39   [place] Ekg Site Rite  Result Date: 06/05/2018 If Site Rite image not attached, placement could not be confirmed due to current cardiac rhythm.   Kalman Drape , [facility] Surgery 06/06/2018, 11:24 AM  Pager: [phone] Mon-Wed, Friday 7:00am-4:30pm Thurs 7am-11:30am Consults: [phone]  Agree with above. My notes are above.  Alphonsa Overall, MD, [facility] Surgery Pager: [phone] Office phone:  [phone]

## 2018-06-06 NOTE — Progress Notes (Signed)
PHARMACY - ADULT TOTAL PARENTERAL NUTRITION CONSULT NOTE   Pharmacy Consult:  TPN Indication:  Obstruction from hiatal hernia  Patient Measurements: Height: 5\' 1"  (154.9 cm) Weight: 152 lb 5.4 oz (69.1 kg) IBW/kg (Calculated) : 47.8 TPN AdjBW (KG): 53.1 Body mass index is 28.78 kg/m. Usual Weight: 68-70 kg  Assessment:  [name] [facility] from [facility]18 with intractable nausea, vomiting, and abdominal/chest pain.  Patient has a history of a large hiatal hernia but is not a good surgical candidate.  She started to have coffee-ground emesis 06/04/18 and EGD showed no evidence of ulcer or active bleeding.  Per documentation, patient previously voiced she does not want a feeding tube and only wants hydration if necessary as treatment.   Patient denies recent weight loss and is at her usual weight.  She was eating 1/3 - 1/2 of her meal trays PTA.  She states that feeding tube is uncomfortable but she will give it a try if needed to maintain her nutrition.  Spoke to [facility], placing a post-pyloric feeding tube is not feasible d/t anatomy/hernia.  Even with surgery, patient will be not be able to have adequate nutrition anytime soon.  Pharmacy consulted to start TPN.  GI: GERD/hiatal hernia - bisacodyl, Reglan, PPI IV Endo: no hx DM - AM glucose WNL Insulin requirements in the past 24 hours: N/A Lytes: all WNL except K 3.1 (6 runs ordered) Renal: SCr 0.7, BUN WNL - NS at 75/hr Pulm: stable on RA Cards: HTN/HLD/CHF - VSS Hepatobil: LFTs / tbili WNL Neuro: A&O ID: CTX for E.coli UTI, CoNS in BCx (?contaminant) - afebrile, WBC up 11.8 TPN Access: PICC placed 06/05/18 TPN start date: 06/06/18  Nutritional Goals (RD rec pending): 1700-1850 kCal and 85-100gm protein per day  Current Nutrition:  NPO   Plan:  Initiate TPN at 30 ml/hr (goal 70 ml/hr) TPN will provide 41gAA, 122g CHO and 19g ILE for a total of 768 kCal, meeting ~43% of patient's needs. Electrolytes in TPN: standard lytes except  increased K / Phos, Cl:Ac 1:1 Daily multivitamin and trace elements in TPN Reduce IVF to 45 ml/hr when TPN starts Start sensitive SSI Q6H.  D/C if CBGs remain controlled at goal TPN rate. Standard TPN labs and nursing care orders F/U with surgical plans   [name] D. Mina Marble, PharmD, BCPS, [place] 06/06/2018, 10:48 AM

## 2018-06-06 NOTE — Progress Notes (Addendum)
PROGRESS NOTE                                                                                                                                                                                                             Patient Demographics:    [name], is a 84 y.o. female, DOB - 12/30/1925, DIY:[id number]  Admit date - 06/02/2018   Admitting Physician Ivor Costa, MD  Outpatient Primary MD for the patient is Deland Pretty, MD  LOS - 2   Chief Complaint  Patient presents with  . Chest Pain  . Abdominal Pain       Brief Narrative    [name] is a 84 y.o. female with medical history significant of hypertension, hyperlipidemia, GERD, mitral valve regurg, dCHF, hiatal hernia, who presents with intractable nausea, vomiting, abdominal pain, and chest pain. Work-up significant for urinary retention, mild UTI, she continues to have nausea and vomiting during hospital stay, apparently this is secondary to large hiatal hernia most of the stomach and her sac, and continues to have persistent nausea vomiting, coffee-ground emesis, improved after NGT insertion with suction, went for endoscopy 06/05/2018, significant for large type III hiatal hernia, with inflammation and erosion, but no evidence of ulcer or active bleeding, possibility of partial torsion cannot be ruled out, as well advancing the scope into the gastric antrum was difficult , patient is followed by general surgery as well .    Subjective:    [name] today is feeling much better after NGT insertion, denies any further nausea or vomiting overnight, she started to have some bowel movements yesterday after starting on Dulcolax suppositories .   Assessment  & Plan :    Principal Problem:   Hiatal hernia Active Problems:   Leukocytosis   Essential hypertension, benign   Chest pain   Abdominal pain   GERD (gastroesophageal reflux disease)   Elevated lactic acid level   Chronic  diastolic CHF (congestive heart failure) (HCC)   SIRS (systemic inflammatory response syndrome) (HCC)    Intractable nausea and vomiting secondary to large type III hiatal hernia -Elderly, frail, provement with n.p.o. status, required NGT insertion with significant improvement of symptoms. -seen by [facility], s/p EGD 06/05/2018 significant for significant for large type III hiatal hernia, with inflammation and erosion, but no evidence of ulcer or active bleeding,  possibility of partial torsion cannot be ruled out, as well advancing the scope into the gastric antrum was difficult, with narrowing of stomach body at hiatal hernia side, likely causing some sort of obstruction causing her symptoms. -Continue with n.p.o. status for now, continue with IV fluids, continue with NGT on ILS, with Protonix IV twice daily. - PICC line inserted, to be started on TPN. -General Surgery on board.  Will discuss with family today about surgical options.  Chest pain with elevated troponins -cardiology nput greatly appreciated, this is most likely in the setting of mild ischemia, 2D echo with normal EF, and without any regional wall motion abnormalities, no further work-up per cardiology . -pt is allergic to ASA -she  had 8-second run of NSVT 06/04/2018, no recurrence, will correct her hypokalemia, continue with telemetry monitoring  Sepsis secondary to UTI/ -Sepsis criteria met on admission, leukocytosis, tachycardic and elevated lactic acid -Leukocytosis most likely related to her vomiting, she had elevated lactic acid as well, work-up significant for UTI urine culture growing E. coli, treated with Rocephin - 1 out of 4 bottles blood culture growing coag negative Staphylococcus, contaminant, no indication to treat    urinary retention -Patient had urinary retention required Foley catheter insertion  Essential hypertension: -Continue to hold home meds as she is n.p.o., continue with management with PRN  hydralazine for now  GERD (gastroesophageal reflux disease) -Coffee ground emesis, but no evidence of bleed ulcer or gastritis on endoscopy, continue with PPI  Chronic diastolic CHF (congestive heart failure) ([place]):  -2D echo on 05/06/2014 showed EF of 55 with grade 1 diastolic dysfunction.  Patient does not have leg edema.  No shortness of breath.  CHF seems to be compensated. - Hold spironolactone  Hypokalemia -Repleted, now on TPN    Code Status : DNR  Family Communication  : Discussed with daughter and son-in-law at bedside 06/05/2018  Disposition Plan  : SNF  Consults  :  cardiology, GI, general surgery  Procedures  : none  DVT Prophylaxis  : Resume on subcu heparin given no evidence of GI bleed on endoscopy  Lab Results  Component Value Date   PLT 288 06/06/2018    Antibiotics  :    Anti-infectives (From admission, onward)   Start     Dose/Rate Route Frequency Ordered Stop   06/03/18 0900  ciprofloxacin (CIPRO) IVPB 400 mg  Status:  Discontinued     400 mg 200 mL/hr over 60 Minutes Intravenous Every 12 hours 06/03/18 0757 06/03/18 0759   06/03/18 0800  cefTRIAXone (ROCEPHIN) 1 g in sodium chloride 0.9 % 100 mL IVPB  Status:  Discontinued     1 g 200 mL/hr over 30 Minutes Intravenous Every 24 hours 06/03/18 0759 06/06/18 1135        Objective:   Vitals:   06/05/18 1005 06/05/18 1524 06/05/18 2059 06/06/18 0619  BP: (!) 155/70 132/83 136/66 (!) 113/52  Pulse: 78 95 81 74  Resp: 15 18    Temp:  97.7 F (36.5 C) 97.8 F (36.6 C) 98 F (36.7 C)  TempSrc:  Oral    SpO2: 97% 95% 99% 97%  Weight:      Height:        Wt Readings from Last 3 Encounters:  06/03/18 69.1 kg (152 lb 5.4 oz)  03/22/18 73.9 kg (163 lb)  10/06/16 70.9 kg (156 lb 6.4 oz)     Intake/Output Summary (Last 24 hours) at 06/06/2018 1136 Last data filed at 06/06/2018 1126 Gross  per 24 hour  Intake 2043.87 ml  Output 950 ml  Net 1093.87 ml     Physical Exam  Frail chronically  ill-appearing female, laying in bed ., denies any complaints (she was seen in early a.m. before her endoscopy ) Air entry bilaterally today, no wheezing RRR,No Gallops,Rubs or new Murmurs, No Parasternal Heave +ve B.Sounds, Abd Soft, no epigastric tenderness today, No rebound - guarding or rigidity. No Cyanosis, Clubbing or edema, No new Rash or bruise      Data Review:    CBC Recent Labs  Lab 06/03/18 0606 06/04/18 0640 06/04/18 1132 06/05/18 0615 06/06/18 0505  WBC 18.0* 16.0* 17.8* 10.3 11.8*  HGB 11.7* 12.2 11.5* 10.1* 9.6*  HCT 35.7* 36.8 34.8* 31.1* 30.3*  PLT 381 370 352 320 288  MCV 89.5 89.8 89.7 91.5 93.2  MCH 29.3 29.8 29.6 29.7 29.5  MCHC 32.8 33.2 33.0 32.5 31.7  RDW 12.5 12.8 12.7 12.9 12.9    Chemistries  Recent Labs  Lab 06/02/18 2053 06/02/18 2234 06/03/18 0606 06/04/18 0640 06/05/18 0615 06/06/18 0505  NA 132*  --  135 137 140 141  K 4.3  --  4.0 3.4* 2.9* 3.1*  CL 94*  --  97* 98 105 107  CO2 24  --  26 26 28 26   GLUCOSE 162*  --  139* 138* 93 77  BUN 17  --  14 15 27* 25*  CREATININE 0.80  --  0.75 0.80 0.72 0.70  CALCIUM 9.2  --  8.8* 9.2 8.3* 8.1*  MG  --   --   --   --  2.2  --   AST  --  23  --   --   --   --   ALT  --  14  --   --   --   --   ALKPHOS  --  78  --   --   --   --   BILITOT  --  0.7  --   --   --   --    ------------------------------------------------------------------------------------------------------------------ No results for input(s): CHOL, HDL, LDLCALC, TRIG, CHOLHDL, LDLDIRECT in the last 72 hours.  Lab Results  Component Value Date   HGBA1C 5.5 06/03/2018   ------------------------------------------------------------------------------------------------------------------ No results for input(s): TSH, T4TOTAL, T3FREE, THYROIDAB in the last 72 hours.  Invalid input(s): FREET3 ------------------------------------------------------------------------------------------------------------------ No results for  input(s): VITAMINB12, FOLATE, FERRITIN, TIBC, IRON, RETICCTPCT in the last 72 hours.  Coagulation profile Recent Labs  Lab 06/04/18 1513  INR 1.21    No results for input(s): DDIMER in the last 72 hours.  Cardiac Enzymes Recent Labs  Lab 06/03/18 0254 06/03/18 0606 06/03/18 1204  TROPONINI 0.05* 0.07* 0.11*   ------------------------------------------------------------------------------------------------------------------    Component Value Date/Time   BNP 567.9 (H) 06/03/2018 0254    Inpatient Medications  Scheduled Meds: . bisacodyl  10 mg Rectal BID  . heparin injection (subcutaneous)  5,000 Units Subcutaneous Q8H  . insulin aspart  0-9 Units Subcutaneous Q6H  . metoCLOPramide (REGLAN) injection  5 mg Intravenous Q8H  . pantoprazole  40 mg Intravenous Q12H   Continuous Infusions: . sodium chloride 75 mL/hr at 06/05/18 1101  . sodium chloride 10 mL/hr at 06/05/18 1104  . sodium chloride    . potassium chloride 10 mEq (06/06/18 1127)  . TPN ADULT (ION)     PRN Meds:.acetaminophen, fentaNYL (SUBLIMAZE) injection, hydrALAZINE, nitroGLYCERIN, ondansetron (ZOFRAN) IV, sodium chloride flush  Micro Results Recent Results (from the past 240 hour(s))  Culture, blood (x 2)     Status: None (Preliminary result)   Collection Time: 06/03/18  2:30 AM  Result Value Ref Range Status   Specimen Description BLOOD RIGHT ARM  Final   Special Requests   Final    BOTTLES DRAWN AEROBIC AND ANAEROBIC Blood Culture adequate volume   Culture   Final    NO GROWTH 2 DAYS Performed at [facility], [address], [place], [place] [address]    Report Status PENDING  Incomplete  Culture, blood (x 2)     Status: Abnormal   Collection Time: 06/03/18  2:50 AM  Result Value Ref Range Status   Specimen Description BLOOD LEFT ARM  Final   Special Requests   Final    BOTTLES DRAWN AEROBIC AND ANAEROBIC Blood Culture adequate volume   Culture  Setup Time   Final    AEROBIC BOTTLE  ONLY GRAM POSITIVE COCCI CRITICAL RESULT CALLED TO, READ BACK BY AND VERIFIED WITH: PHARMD N BATCHELDER 06/04/18 AT 29 BY CM    Culture (A)  Final    STAPHYLOCOCCUS SPECIES (COAGULASE NEGATIVE) THE SIGNIFICANCE OF ISOLATING THIS ORGANISM FROM A SINGLE SET OF BLOOD CULTURES WHEN MULTIPLE SETS ARE DRAWN IS UNCERTAIN. PLEASE NOTIFY THE MICROBIOLOGY DEPARTMENT WITHIN ONE WEEK IF SPECIATION AND SENSITIVITIES ARE REQUIRED. Performed at [facility], [place] [address] [place], [place] [address]    Report Status 06/06/2018 FINAL  Final  Blood Culture ID Panel (Reflexed)     Status: Abnormal   Collection Time: 06/03/18  2:50 AM  Result Value Ref Range Status   Enterococcus species NOT DETECTED NOT DETECTED Final   Listeria monocytogenes NOT DETECTED NOT DETECTED Final   Staphylococcus species DETECTED (A) NOT DETECTED Final    Comment: Methicillin (oxacillin) susceptible coagulase negative staphylococcus. Possible blood culture contaminant (unless isolated from more than one blood culture draw or clinical case suggests pathogenicity). No antibiotic treatment is indicated for blood  culture contaminants. CRITICAL RESULT CALLED TO, READ BACK BY AND VERIFIED WITH: PHARMD N BETCHELDER 06/04/18 AT 812 BY CM    Staphylococcus aureus NOT DETECTED NOT DETECTED Final   Methicillin resistance NOT DETECTED NOT DETECTED Final   Streptococcus species NOT DETECTED NOT DETECTED Final   Streptococcus agalactiae NOT DETECTED NOT DETECTED Final   Streptococcus pneumoniae NOT DETECTED NOT DETECTED Final   Streptococcus pyogenes NOT DETECTED NOT DETECTED Final   Acinetobacter baumannii NOT DETECTED NOT DETECTED Final   Enterobacteriaceae species NOT DETECTED NOT DETECTED Final   Enterobacter cloacae complex NOT DETECTED NOT DETECTED Final   Escherichia coli NOT DETECTED NOT DETECTED Final   Klebsiella oxytoca NOT DETECTED NOT DETECTED Final   Klebsiella pneumoniae NOT DETECTED NOT DETECTED Final   Proteus  species NOT DETECTED NOT DETECTED Final   Serratia marcescens NOT DETECTED NOT DETECTED Final   Haemophilus influenzae NOT DETECTED NOT DETECTED Final   Neisseria meningitidis NOT DETECTED NOT DETECTED Final   Pseudomonas aeruginosa NOT DETECTED NOT DETECTED Final   Candida albicans NOT DETECTED NOT DETECTED Final   Candida glabrata NOT DETECTED NOT DETECTED Final   Candida krusei NOT DETECTED NOT DETECTED Final   Candida parapsilosis NOT DETECTED NOT DETECTED Final   Candida tropicalis NOT DETECTED NOT DETECTED Final    Comment: Performed at [facility], [place]. [address] [place], [place] [address]  MRSA PCR Screening     Status: None   Collection Time: 06/03/18  4:54 AM  Result Value Ref Range Status  MRSA by PCR NEGATIVE NEGATIVE Final    Comment:        The GeneXpert MRSA Assay (FDA approved for NASAL specimens only), is one component of a comprehensive MRSA colonization surveillance program. It is not intended to diagnose MRSA infection nor to guide or monitor treatment for MRSA infections. Performed at [facility], [place] [address], [place], [place] [address]   Culture, Urine     Status: Abnormal   Collection Time: 06/03/18 10:45 AM  Result Value Ref Range Status   Specimen Description URINE, CATHETERIZED  Final   Special Requests   Final    NONE Performed at [facility], [place] [address], [place], [place] [address]    Culture 30,000 COLONIES/mL ESCHERICHIA COLI (A)  Final   Report Status 06/05/2018 FINAL  Final   Organism ID, Bacteria ESCHERICHIA COLI (A)  Final      Susceptibility   Escherichia coli - MIC*    AMPICILLIN >=32 RESISTANT Resistant     CEFAZOLIN >=64 RESISTANT Resistant     CEFTRIAXONE <=1 SENSITIVE Sensitive     CIPROFLOXACIN <=0.25 SENSITIVE Sensitive     GENTAMICIN <=1 SENSITIVE Sensitive     IMIPENEM <=0.25 SENSITIVE Sensitive     NITROFURANTOIN 32 SENSITIVE Sensitive     TRIMETH/SULFA >=320 RESISTANT Resistant      AMPICILLIN/SULBACTAM >=32 RESISTANT Resistant     PIP/TAZO >=128 RESISTANT Resistant     Extended ESBL NEGATIVE Sensitive     * 30,000 COLONIES/mL ESCHERICHIA COLI    Radiology Reports Dg Chest 2 View  Result Date: 06/02/2018 CLINICAL DATA:  Chest pain and vomiting. EXAM: CHEST - 2 VIEW COMPARISON:  03/19/2018 and chest CT 08/05/2015 FINDINGS: Lungs are somewhat hypoinflated demonstrate mild opacification over the left base which may be due to small amount of pleural fluid/atelectasis. Mild prominence of the perihilar markings suggesting mild degree of vascular congestion. Mild stable cardiomegaly. Stable elevation of the left hemidiaphragm with large hiatal hernia unchanged. Remainder of the exam is unchanged. IMPRESSION: Mild cardiomegaly with suggestion of mild vascular congestion. Mild left base opacification likely small effusion with atelectasis. Large hiatal hernia unchanged. Electronically Signed   By: Marin Olp M.D.   On: 06/02/2018 21:42   Ct Abdomen Pelvis W Contrast  Result Date: 06/02/2018 CLINICAL DATA:  84 year old female with acute abdominal pain and vomiting today. EXAM: CT ABDOMEN AND PELVIS WITH CONTRAST TECHNIQUE: Multidetector CT imaging of the abdomen and pelvis was performed using the standard protocol following bolus administration of intravenous contrast. CONTRAST:  173m OMNIPAQUE IOHEXOL 300 MG/ML  SOLN COMPARISON:  05/20/2016 and prior CTs FINDINGS: Lower chest: LEFT basilar atelectasis noted. Hepatobiliary: The liver and gallbladder are unremarkable. Pancreas: Unremarkable Spleen: Unremarkable Adrenals/Urinary Tract: Moderate to marked distention of bladder again noted. Kidneys and adrenal glands are unremarkable. Stomach/Bowel: A large hiatal hernia is noted with a distended stomach in the LOWER LEFT chest/UPPER abdomen-but unchanged in appearance and configuration since 08/05/2015 No dilated small bowel loops identified. No colonic abnormalities are identified.  Vascular/Lymphatic: Aortic atherosclerosis. No enlarged abdominal or pelvic lymph nodes. Reproductive: Uterus and bilateral adnexa are unremarkable. Other: No ascites, pneumoperitoneum or abscess. A moderate paraumbilical hernia containing fat is present. Musculoskeletal: No acute abnormalities identified. Lumbar scoliosis and moderate to severe multilevel degenerative changes again noted. LEFT hip arthroplasty again identified. IMPRESSION: 1. Large hiatal hernia with distended stomach in the LOWER LEFT chest/UPPER abdomen with unchanged appearance and configuration of the stomach when compared to 08/05/2015. 2. Distended bladder. 3.  Moderate paraumbilical hernia containing fat 4.  Aortic Atherosclerosis (ICD10-I70.0). Electronically Signed   By: Margarette Canada M.D.   On: 06/02/2018 23:28   Dg Abd Portable 1v  Result Date: 06/04/2018 CLINICAL DATA:  Orogastric tube placement EXAM: PORTABLE ABDOMEN - 1 VIEW COMPARISON:  CT abdomen and pelvis June 02, 2018 FINDINGS: Orogastric tube tip and side port are in the proximal stomach. Note that the left hemidiaphragm is elevated in the stomach is unusually superior in position as is demonstrated on recent CT. There is moderate stool in the colon. There is no bowel dilatation or air-fluid level to suggest bowel obstruction. No free air. There is degenerative change in the lower thoracic and lumbar spine regions. IMPRESSION: Orogastric tube tip and side port in proximal stomach. Stomach is present inferior to an elevated left hemidiaphragm. No bowel obstruction or free air evident. Moderate stool noted in colon. Electronically Signed   By: Lowella Grip III M.D.   On: 06/04/2018 14:39   [place] Ekg Site Rite  Result Date: 06/05/2018 If Site Rite image not attached, placement could not be confirmed due to current cardiac rhythm.    Phillips Climes M.D on 06/06/2018 at 11:36 AM  Between 7am to 7pm - Pager - [phone]  After 7pm go to [email or web address] - password  [facility]  [facility] -  Office  [phone]

## 2018-06-07 ENCOUNTER — Inpatient Hospital Stay (HOSPITAL_COMMUNITY): Payer: Medicare Other | Admitting: Certified Registered Nurse Anesthetist

## 2018-06-07 ENCOUNTER — Encounter (HOSPITAL_COMMUNITY): Admission: EM | Disposition: A | Discharge status: Skilled Nursing Facility | Payer: Self-pay | Source: Home / Self Care

## 2018-06-07 ENCOUNTER — Encounter (HOSPITAL_COMMUNITY): Payer: Self-pay | Admitting: Certified Registered Nurse Anesthetist

## 2018-06-07 DIAGNOSIS — Z931 Gastrostomy status: Secondary | ICD-10-CM

## 2018-06-07 HISTORY — PX: LAPAROSCOPIC GASTROSTOMY: SHX5896

## 2018-06-07 HISTORY — PX: HIATAL HERNIA REPAIR: SHX195

## 2018-06-07 LAB — DIFFERENTIAL
Abs Immature Granulocytes: 0.1 10*3/uL (ref 0.0–0.1)
Basophils Absolute: 0 10*3/uL (ref 0.0–0.1)
Basophils Relative: 0 %
Eosinophils Absolute: 0.2 10*3/uL (ref 0.0–0.7)
Eosinophils Relative: 2 %
Immature Granulocytes: 1 %
Lymphocytes Relative: 13 %
Lymphs Abs: 1.4 10*3/uL (ref 0.7–4.0)
Monocytes Absolute: 1.2 10*3/uL — ABNORMAL HIGH (ref 0.1–1.0)
Monocytes Relative: 11 %
Neutro Abs: 8 10*3/uL — ABNORMAL HIGH (ref 1.7–7.7)
Neutrophils Relative %: 73 %

## 2018-06-07 LAB — TRIGLYCERIDES: Triglycerides: 97 mg/dL (ref <150)

## 2018-06-07 LAB — COMPREHENSIVE METABOLIC PANEL
ALBUMIN: 2.8 g/dL — AB (ref 3.5–5.0)
ALT: 12 U/L (ref 0–44)
AST: 14 U/L — AB (ref 15–41)
Alkaline Phosphatase: 60 U/L (ref 38–126)
Anion gap: 10 (ref 5–15)
BILIRUBIN TOTAL: 0.7 mg/dL (ref 0.3–1.2)
BUN: 16 mg/dL (ref 8–23)
CHLORIDE: 104 mmol/L (ref 98–111)
CO2: 25 mmol/L (ref 22–32)
Calcium: 8.2 mg/dL — ABNORMAL LOW (ref 8.9–10.3)
Creatinine, Ser: 0.6 mg/dL (ref 0.44–1.00)
GFR calc Af Amer: >60 mL/min (ref >60)
GFR calc non Af Amer: >60 mL/min (ref >60)
GLUCOSE: 127 mg/dL — AB (ref 70–99)
POTASSIUM: 3 mmol/L — AB (ref 3.5–5.1)
Sodium: 139 mmol/L (ref 135–145)
Total Protein: 5.5 g/dL — ABNORMAL LOW (ref 6.5–8.1)

## 2018-06-07 LAB — POCT I-STAT 4, (NA,K, GLUC, HGB,HCT)
Glucose, Bld: 134 mg/dL — ABNORMAL HIGH (ref 70–99)
HCT: 26 % — ABNORMAL LOW (ref 36.0–46.0)
Hemoglobin: 8.8 g/dL — ABNORMAL LOW (ref 12.0–15.0)
POTASSIUM: 3.9 mmol/L (ref 3.5–5.1)
SODIUM: 137 mmol/L (ref 135–145)

## 2018-06-07 LAB — GLUCOSE, CAPILLARY
GLUCOSE-CAPILLARY: 120 mg/dL — AB (ref 70–99)
GLUCOSE-CAPILLARY: 150 mg/dL — AB (ref 70–99)
Glucose-Capillary: 128 mg/dL — ABNORMAL HIGH (ref 70–99)
Glucose-Capillary: 202 mg/dL — ABNORMAL HIGH (ref 70–99)

## 2018-06-07 LAB — CBC
HCT: 30.1 % — ABNORMAL LOW (ref 36.0–46.0)
Hemoglobin: 9.7 g/dL — ABNORMAL LOW (ref 12.0–15.0)
MCH: 29.6 pg (ref 26.0–34.0)
MCHC: 32.2 g/dL (ref 30.0–36.0)
MCV: 91.8 fL (ref 78.0–100.0)
Platelets: 298 10*3/uL (ref 150–400)
RBC: 3.28 MIL/uL — ABNORMAL LOW (ref 3.87–5.11)
RDW: 12.8 % (ref 11.5–15.5)
WBC: 10.9 10*3/uL — ABNORMAL HIGH (ref 4.0–10.5)

## 2018-06-07 LAB — SURGICAL PCR SCREEN
MRSA, PCR: NEGATIVE
STAPHYLOCOCCUS AUREUS: NEGATIVE

## 2018-06-07 LAB — MAGNESIUM: Magnesium: 1.9 mg/dL (ref 1.7–2.4)

## 2018-06-07 LAB — PHOSPHORUS: Phosphorus: 2 mg/dL — ABNORMAL LOW (ref 2.5–4.6)

## 2018-06-07 LAB — PREALBUMIN: Prealbumin: 14.5 mg/dL — ABNORMAL LOW (ref 18–38)

## 2018-06-07 SURGERY — REPAIR, HERNIA, HIATAL, LAPAROSCOPIC
Anesthesia: General

## 2018-06-07 MED ORDER — STERILE WATER FOR IRRIGATION IR SOLN
Status: DC | PRN
  Administered 2018-06-07: 1000 mL

## 2018-06-07 MED ORDER — PROPOFOL 10 MG/ML IV BOLUS
INTRAVENOUS | Status: AC
  Filled 2018-06-07: qty 20

## 2018-06-07 MED ORDER — FENTANYL CITRATE (PF) 100 MCG/2ML IJ SOLN: 25.0000 ug | INTRAMUSCULAR | Status: DC | PRN

## 2018-06-07 MED ORDER — CEFOTETAN DISODIUM-DEXTROSE 2-2.08 GM-%(50ML) IV SOLR: 2.0000 g | Freq: Two times a day (BID) | INTRAVENOUS | Status: DC

## 2018-06-07 MED ORDER — BUPIVACAINE LIPOSOME 1.3 % IJ SUSP
INTRAMUSCULAR | Status: DC | PRN
  Administered 2018-06-07: 20 mL

## 2018-06-07 MED ORDER — PHENYLEPHRINE HCL 10 MG/ML IJ SOLN
INTRAMUSCULAR | Status: DC | PRN
  Administered 2018-06-07: 50 ug/min via INTRAVENOUS

## 2018-06-07 MED ORDER — PHENYLEPHRINE 40 MCG/ML (10ML) SYRINGE FOR IV PUSH (FOR BLOOD PRESSURE SUPPORT)
PREFILLED_SYRINGE | INTRAVENOUS | Status: AC
  Filled 2018-06-07: qty 10

## 2018-06-07 MED ORDER — LACTATED RINGERS IV SOLN
INTRAVENOUS | Status: DC
  Administered 2018-06-07: 14:00:00 via INTRAVENOUS

## 2018-06-07 MED ORDER — ONDANSETRON HCL 4 MG/2ML IJ SOLN: 4.0000 mg | Freq: Once | INTRAMUSCULAR | Status: DC | PRN

## 2018-06-07 MED ORDER — ROCURONIUM BROMIDE 10 MG/ML (PF) SYRINGE
PREFILLED_SYRINGE | INTRAVENOUS | Status: AC
  Filled 2018-06-07: qty 10

## 2018-06-07 MED ORDER — DEXAMETHASONE SODIUM PHOSPHATE 10 MG/ML IJ SOLN
INTRAMUSCULAR | Status: AC
  Filled 2018-06-07: qty 1

## 2018-06-07 MED ORDER — PHENYLEPHRINE 40 MCG/ML (10ML) SYRINGE FOR IV PUSH (FOR BLOOD PRESSURE SUPPORT)
PREFILLED_SYRINGE | INTRAVENOUS | Status: DC | PRN
  Administered 2018-06-07 (×2): 120 ug via INTRAVENOUS

## 2018-06-07 MED ORDER — LACTATED RINGERS IV SOLN
INTRAVENOUS | Status: DC | PRN
  Administered 2018-06-07 (×2): via INTRAVENOUS

## 2018-06-07 MED ORDER — BUPIVACAINE LIPOSOME 1.3 % IJ SUSP
20.0000 mL | INTRAMUSCULAR | Status: DC
  Filled 2018-06-07 (×2): qty 20

## 2018-06-07 MED ORDER — TRAVASOL 10 % IV SOLN
INTRAVENOUS | Status: AC
  Administered 2018-06-07: 19:00:00 via INTRAVENOUS
  Filled 2018-06-07: qty 684

## 2018-06-07 MED ORDER — SODIUM CHLORIDE 0.9 % IV SOLN
INTRAVENOUS | Status: AC
  Administered 2018-06-07: 23:00:00 via INTRAVENOUS

## 2018-06-07 MED ORDER — POTASSIUM CHLORIDE 10 MEQ/50ML IV SOLN
10.0000 meq | INTRAVENOUS | Status: AC
  Administered 2018-06-07 (×5): 10 meq via INTRAVENOUS
  Filled 2018-06-07 (×6): qty 50

## 2018-06-07 MED ORDER — BUPIVACAINE-EPINEPHRINE 0.25% -1:200000 IJ SOLN
INTRAMUSCULAR | Status: DC | PRN
  Administered 2018-06-07: 20 mL

## 2018-06-07 MED ORDER — LIDOCAINE 2% (20 MG/ML) 5 ML SYRINGE
INTRAMUSCULAR | Status: AC
  Filled 2018-06-07: qty 5

## 2018-06-07 MED ORDER — SODIUM CHLORIDE 0.9 % IV SOLN
1.0000 g | INTRAVENOUS | Status: DC
  Filled 2018-06-07: qty 1

## 2018-06-07 MED ORDER — POTASSIUM CHLORIDE 10 MEQ/50ML IV SOLN
10.0000 meq | INTRAVENOUS | Status: DC
  Filled 2018-06-07 (×3): qty 50

## 2018-06-07 MED ORDER — SUCCINYLCHOLINE CHLORIDE 200 MG/10ML IV SOSY
PREFILLED_SYRINGE | INTRAVENOUS | Status: DC | PRN
  Administered 2018-06-07: 120 mg via INTRAVENOUS

## 2018-06-07 MED ORDER — DEXAMETHASONE SODIUM PHOSPHATE 10 MG/ML IJ SOLN
INTRAMUSCULAR | Status: DC | PRN
  Administered 2018-06-07: 10 mg via INTRAVENOUS

## 2018-06-07 MED ORDER — ROCURONIUM BROMIDE 10 MG/ML (PF) SYRINGE
PREFILLED_SYRINGE | INTRAVENOUS | Status: DC | PRN
  Administered 2018-06-07: 20 mg via INTRAVENOUS
  Administered 2018-06-07: 40 mg via INTRAVENOUS
  Administered 2018-06-07: 20 mg via INTRAVENOUS

## 2018-06-07 MED ORDER — ONDANSETRON HCL 4 MG/2ML IJ SOLN
INTRAMUSCULAR | Status: AC
  Filled 2018-06-07: qty 2

## 2018-06-07 MED ORDER — SUGAMMADEX SODIUM 200 MG/2ML IV SOLN
INTRAVENOUS | Status: DC | PRN
  Administered 2018-06-07: 140 mg via INTRAVENOUS

## 2018-06-07 MED ORDER — PROPOFOL 10 MG/ML IV BOLUS
INTRAVENOUS | Status: DC | PRN
  Administered 2018-06-07: 90 mg via INTRAVENOUS

## 2018-06-07 MED ORDER — 0.9 % SODIUM CHLORIDE (POUR BTL) OPTIME
TOPICAL | Status: DC | PRN
  Administered 2018-06-07 (×2): 1000 mL

## 2018-06-07 MED ORDER — SODIUM CHLORIDE 0.9 % IV SOLN
2.0000 g | INTRAVENOUS | Status: DC
  Filled 2018-06-07: qty 2

## 2018-06-07 MED ORDER — FENTANYL CITRATE (PF) 250 MCG/5ML IJ SOLN
INTRAMUSCULAR | Status: DC | PRN
  Administered 2018-06-07 (×4): 50 ug via INTRAVENOUS

## 2018-06-07 MED ORDER — LIDOCAINE 2% (20 MG/ML) 5 ML SYRINGE
INTRAMUSCULAR | Status: DC | PRN
  Administered 2018-06-07: 30 mg via INTRAVENOUS

## 2018-06-07 MED ORDER — CEFOTETAN DISODIUM-DEXTROSE 2-2.08 GM-%(50ML) IV SOLR: 2.0000 g | INTRAVENOUS | Status: DC

## 2018-06-07 MED ORDER — FENTANYL CITRATE (PF) 100 MCG/2ML IJ SOLN
12.5000 ug | INTRAMUSCULAR | Status: DC | PRN
  Administered 2018-06-08 (×2): 12.5 ug via INTRAVENOUS
  Filled 2018-06-07 (×2): qty 2

## 2018-06-07 MED ORDER — POTASSIUM PHOSPHATES 15 MMOLE/5ML IV SOLN
20.0000 mmol | Freq: Once | INTRAVENOUS | Status: DC
  Filled 2018-06-07: qty 6.67

## 2018-06-07 MED ORDER — ESMOLOL HCL 100 MG/10ML IV SOLN
INTRAVENOUS | Status: DC | PRN
  Administered 2018-06-07: 10 mg via INTRAVENOUS

## 2018-06-07 MED ORDER — ESMOLOL HCL 100 MG/10ML IV SOLN
INTRAVENOUS | Status: AC
  Filled 2018-06-07: qty 10

## 2018-06-07 MED ORDER — MAGNESIUM SULFATE 2 GM/50ML IV SOLN
2.0000 g | Freq: Once | INTRAVENOUS | Status: AC
  Administered 2018-06-07: 2 g via INTRAVENOUS
  Filled 2018-06-07: qty 50

## 2018-06-07 MED ORDER — FENTANYL CITRATE (PF) 250 MCG/5ML IJ SOLN
INTRAMUSCULAR | Status: AC
Start: 2018-06-07 — End: ?
  Filled 2018-06-07: qty 5

## 2018-06-07 SURGICAL SUPPLY — 78 items
APPLIER CLIP ROT 10 11.4 M/L (STAPLE)
BENZOIN TINCTURE PRP APPL 2/3 (GAUZE/BANDAGES/DRESSINGS) IMPLANT
BLADE CLIPPER SURG (BLADE) IMPLANT
CANISTER SUCT 3000ML PPV (MISCELLANEOUS) ×3 IMPLANT
CATH GASTROSTOMY 20FR (CATHETERS) ×3 IMPLANT
CATH MUSHROOM 20FR (CATHETERS) IMPLANT
CATH MUSHROOM 22FR (CATHETERS) IMPLANT
CHLORAPREP W/TINT 26ML (MISCELLANEOUS) ×3 IMPLANT
CLAMP ENDO BABCK 10MM (STAPLE) IMPLANT
CLIP APPLIE ROT 10 11.4 M/L (STAPLE) IMPLANT
COVER SURGICAL LIGHT HANDLE (MISCELLANEOUS) ×3 IMPLANT
DECANTER SPIKE VIAL GLASS SM (MISCELLANEOUS) ×3 IMPLANT
DERMABOND ADVANCED (GAUZE/BANDAGES/DRESSINGS) ×2
DERMABOND ADVANCED .7 DNX12 (GAUZE/BANDAGES/DRESSINGS) ×1 IMPLANT
DEVICE SUT QUICK LOAD TK 5 (STAPLE) ×8 IMPLANT
DEVICE SUT TI-KNOT TK 5X26 (MISCELLANEOUS) ×2 IMPLANT
DEVICE SUTURE ENDOST 10MM (ENDOMECHANICALS) ×3 IMPLANT
DEVICE TI KNOT TK5 (MISCELLANEOUS) ×1
DEVICE TROCAR PUNCTURE CLOSURE (ENDOMECHANICALS) ×3 IMPLANT
DISSECTOR BLUNT TIP ENDO 5MM (MISCELLANEOUS) IMPLANT
DRAIN PENROSE 1/2X12 LTX STRL (WOUND CARE) ×3 IMPLANT
DRAIN PENROSE 1/2X36 STERILE (WOUND CARE) IMPLANT
DRAPE LAPAROSCOPIC ABDOMINAL (DRAPES) ×3 IMPLANT
DRAPE LAPAROTOMY T 102X78X121 (DRAPES) IMPLANT
DRAPE UTILITY XL STRL (DRAPES) IMPLANT
ELECT REM PT RETURN 9FT ADLT (ELECTROSURGICAL) ×3
ELECTRODE REM PT RTRN 9FT ADLT (ELECTROSURGICAL) ×1 IMPLANT
GAUZE SPONGE 4X4 12PLY STRL (GAUZE/BANDAGES/DRESSINGS) ×3 IMPLANT
GLOVE BIO SURGEON STRL SZ8 (GLOVE) ×3 IMPLANT
GLOVE BIOGEL M 8.0 STRL (GLOVE) IMPLANT
GLOVE BIOGEL PI IND STRL 8 (GLOVE) ×1 IMPLANT
GLOVE BIOGEL PI INDICATOR 8 (GLOVE) ×2
GLOVE SURG SIGNA 7.5 PF LTX (GLOVE) ×3 IMPLANT
GOWN STRL REUS W/ TWL LRG LVL3 (GOWN DISPOSABLE) ×1 IMPLANT
GOWN STRL REUS W/ TWL XL LVL3 (GOWN DISPOSABLE) ×1 IMPLANT
GOWN STRL REUS W/TWL LRG LVL3 (GOWN DISPOSABLE) ×2
GOWN STRL REUS W/TWL XL LVL3 (GOWN DISPOSABLE) ×2
KIT BASIN OR (CUSTOM PROCEDURE TRAY) ×3 IMPLANT
KIT TURNOVER KIT B (KITS) ×3 IMPLANT
NEEDLE HYPO 25GX1X1/2 BEV (NEEDLE) IMPLANT
NS IRRIG 1000ML POUR BTL (IV SOLUTION) ×6 IMPLANT
PACK GENERAL/GYN (CUSTOM PROCEDURE TRAY) ×3 IMPLANT
PAD ARMBOARD 7.5X6 YLW CONV (MISCELLANEOUS) ×3 IMPLANT
PENCIL BUTTON HOLSTER BLD 10FT (ELECTRODE) IMPLANT
PLUG CATH AND CAP STER (CATHETERS) IMPLANT
QUICK LOAD TK 5 (STAPLE) ×4
SCISSORS LAP 5X35 DISP (ENDOMECHANICALS) ×3 IMPLANT
SET IRRIG TUBING LAPAROSCOPIC (IRRIGATION / IRRIGATOR) ×3 IMPLANT
SHEARS HARMONIC ACE PLUS 36CM (ENDOMECHANICALS) ×3 IMPLANT
SLEEVE ADV FIXATION 5X100MM (TROCAR) IMPLANT
SPONGE LAP 18X18 X RAY DECT (DISPOSABLE) IMPLANT
STAPLER VISISTAT 35W (STAPLE) IMPLANT
SUT ETHILON 2 0 FS 18 (SUTURE) ×3 IMPLANT
SUT NOVA 1 T20/GS 25DT (SUTURE) IMPLANT
SUT NOVA NAB DX-16 0-1 5-0 T12 (SUTURE) IMPLANT
SUT PDS AB 1 CT  36 (SUTURE) ×6
SUT PDS AB 1 CT 36 (SUTURE) ×3 IMPLANT
SUT SURGIDAC NAB ES-9 0 48 120 (SUTURE) ×12 IMPLANT
SUT VIC AB 1 CT1 27 (SUTURE) ×2
SUT VIC AB 1 CT1 27XBRD ANBCTR (SUTURE) ×1 IMPLANT
SUT VIC AB 2-0 SH 18 (SUTURE) IMPLANT
SYR CONTROL 10ML LL (SYRINGE) IMPLANT
TIP INNERVISION DETACH 40FR (MISCELLANEOUS) IMPLANT
TIP INNERVISION DETACH 50FR (MISCELLANEOUS) IMPLANT
TIP INNERVISION DETACH 56FR (MISCELLANEOUS) IMPLANT
TIPS INNERVISION DETACH 40FR (MISCELLANEOUS)
TOWEL OR 17X24 6PK STRL BLUE (TOWEL DISPOSABLE) ×3 IMPLANT
TOWEL OR 17X26 10 PK STRL BLUE (TOWEL DISPOSABLE) ×3 IMPLANT
TRAY FOLEY MTR SLVR 14FR STAT (SET/KITS/TRAYS/PACK) ×3 IMPLANT
TRAY LAPAROSCOPIC MC (CUSTOM PROCEDURE TRAY) ×3 IMPLANT
TROCAR ADV FIXATION 5X100MM (TROCAR) ×12 IMPLANT
TROCAR BLADELESS 11MM (ENDOMECHANICALS) ×3 IMPLANT
TROCAR XCEL BLADELESS 5X75MML (TROCAR) ×3 IMPLANT
TROCAR XCEL BLUNT TIP 100MML (ENDOMECHANICALS) IMPLANT
TROCAR XCEL NON-BLD 11X100MML (ENDOMECHANICALS) IMPLANT
TROCAR XCEL NON-BLD 5MMX100MML (ENDOMECHANICALS) ×3 IMPLANT
TUBING INSUF HEATED (TUBING) ×3 IMPLANT
WATER STERILE IRR 1000ML POUR (IV SOLUTION) ×3 IMPLANT

## 2018-06-07 NOTE — Anesthesia Procedure Notes (Signed)
Procedure Name: Intubation Date/Time: 06/07/2018 3:04 PM Performed by: Harden Mo, CRNA Pre-anesthesia Checklist: Patient identified, Emergency Drugs available, Suction available and Patient being monitored Patient Re-evaluated:Patient Re-evaluated prior to induction Oxygen Delivery Method: Circle System Utilized Preoxygenation: Pre-oxygenation with 100% oxygen Induction Type: IV induction, Rapid sequence and Cricoid Pressure applied Laryngoscope Size: Miller and 2 Grade View: Grade I Tube type: Oral Tube size: 7.5 mm Number of attempts: 1 Airway Equipment and Method: Stylet and Oral airway Placement Confirmation: ETT inserted through vocal cords under direct vision,  positive ETCO2 and breath sounds checked- equal and bilateral Secured at: 21 cm Tube secured with: Tape Dental Injury: Teeth and Oropharynx as per pre-operative assessment

## 2018-06-07 NOTE — Progress Notes (Addendum)
[facility] Progress Note  2 Days Post-Op  Subjective: CC:  Did not get much sleep last night. Denies abdominal pain or chest pain. Had a BM yesterday after suppository. No N/V with NG tube in place. Patient and her family member at bedside again state that they want to proceed with surgery. I discussed that surgery is tentatively planned for 1500.   Objective: Vital signs in last 24 hours: Temp:  [97.5 F (36.4 C)-98.1 F (36.7 C)] 98.1 F (36.7 C) (07/09 0500) Pulse Rate:  [75-85] 78 (07/09 0500) Resp:  [18] 18 (07/09 0500) BP: (128-179)/(61-95) 166/78 (07/09 0500) SpO2:  [95 %-98 %] 96 % (07/09 0500) Last BM Date: 06/06/18  Intake/Output from previous day: 07/08 0701 - 07/09 0700 In: 1914.4 [P.O.:660; I.V.:1004.4; IV Piggyback:250] Out: 3751 [Urine:1500; Emesis/NG output:2250; Stool:1] Intake/Output this shift: Total I/O In: 636.5 [I.V.:539.5; IV Piggyback:97] Out: -   PE: Gen:  Alert, NAD, pleasant and cooperative  Card:  Regular rate and rhythm, pedal pulses 2+ BL Pulm:  Normal effort, clear to auscultation bilaterally Abd: Soft, non-tender, mild distention, bowel sounds present in all 4 quadrants,reducible umbilical hernia  Skin: warm and dry, no rashes  Psych: A&Ox3   Lab Results:  Recent Labs    06/06/18 0505 06/07/18 0446  WBC 11.8* 10.9*  HGB 9.6* 9.7*  HCT 30.3* 30.1*  PLT 288 298   BMET Recent Labs    06/06/18 0505 06/07/18 0446  NA 141 139  K 3.1* 3.0*  CL 107 104  CO2 26 25  GLUCOSE 77 127*  BUN 25* 16  CREATININE 0.70 0.60  CALCIUM 8.1* 8.2*   PT/INR Recent Labs    06/04/18 1513  LABPROT 15.2  INR 1.21   CMP     Component Value Date/Time   NA 139 06/07/2018 0446   K 3.0 (L) 06/07/2018 0446   CL 104 06/07/2018 0446   CO2 25 06/07/2018 0446   GLUCOSE 127 (H) 06/07/2018 0446   BUN 16 06/07/2018 0446   CREATININE 0.60 06/07/2018 0446   CALCIUM 8.2 (L) 06/07/2018 0446   PROT 5.5 (L) 06/07/2018 0446   ALBUMIN 2.8  (L) 06/07/2018 0446   AST 14 (L) 06/07/2018 0446   ALT 12 06/07/2018 0446   ALKPHOS 60 06/07/2018 0446   BILITOT 0.7 06/07/2018 0446   GFRNONAA >60 06/07/2018 0446   GFRAA >60 06/07/2018 0446   Lipase     Component Value Date/Time   LIPASE 22 06/02/2018 2053       Studies/Results: [place] Ekg Site Rite  Result Date: 06/05/2018 If Site Rite image not attached, placement could not be confirmed due to current cardiac rhythm.   Anti-infectives: Anti-infectives (From admission, onward)   Start     Dose/Rate Route Frequency Ordered Stop   06/07/18 0600  cefoTEtan in Dextrose 5% (CEFOTAN) IVPB 2 g     2 g 100 mL/hr over 30 Minutes Intravenous On call to O.R. 06/06/18 2009 06/08/18 0559   06/03/18 0900  ciprofloxacin (CIPRO) IVPB 400 mg  Status:  Discontinued     400 mg 200 mL/hr over 60 Minutes Intravenous Every 12 hours 06/03/18 0757 06/03/18 0759   06/03/18 0800  cefTRIAXone (ROCEPHIN) 1 g in sodium chloride 0.9 % 100 mL IVPB  Status:  Discontinued     1 g 200 mL/hr over 30 Minutes Intravenous Every 24 hours 06/03/18 0759 06/06/18 1135     Assessment/Plan Nausea and vomiting Type III hiatal hernia             -  S/P EGD, 07/07, Dr. Alessandra Bevels, Findings: large type III hiatal hernia with inflammation and erosion in the fundus. Possibility of partial torsion cannot be ruled out, no evidence of ulcer or bleeding. GI has signed off             - Dr. Lucia Gaskins spoke with the patient and her family yesterday 7/8 and they would like to proceed with hiatal hernia repair, probably gastrostomy tube.   UTI Chest pain CHF - EF 55%  GERD   FEN: NPO, IVF ID: Rocephin for UTI VTE: SCD's, subcutaneous heparin Foley: in place due to retention     LOS: 3 days    Jill Alexanders , [facility] 06/07/2018, 10:24 AM Pager: [phone]  Agree with above. Ready for surgery.  Alphonsa Overall, MD, [facility] Pager: [phone] Office phone:   [phone]

## 2018-06-07 NOTE — Progress Notes (Signed)
Pt moans at intervals, not following commands, looks at me when I call her name.  Dr Linna Caprice here & aware. Will continue to monitor.

## 2018-06-07 NOTE — Care Management Important Message (Signed)
Important Message  Patient Details  Name: [name] MRN: [id number] Date of Birth: 02/12/1926   Medicare Important Message Given:  Yes    [name] 06/07/2018, 2:15 PM

## 2018-06-07 NOTE — Progress Notes (Signed)
Pt more arousable, HG present to command,weak, moves feet weakly when asked. Sleeps and appears comfortable when left alone. Dr Lucia Gaskins here.

## 2018-06-07 NOTE — Transfer of Care (Signed)
Immediate Anesthesia Transfer of Care Note  Patient: [name]  Procedure(s) Performed: LAPAROSCOPIC REPAIR OF HIATAL HERNIA (N/A ) LAPAROSCOPIC GASTROSTOMY (N/A )  Patient Location: PACU  Anesthesia Type:General  Level of Consciousness: awake and patient cooperative  Airway & Oxygen Therapy: Patient Spontanous Breathing and Patient connected to nasal cannula oxygen  Post-op Assessment: Report given to RN and Post -op Vital signs reviewed and stable  Post vital signs: Reviewed and stable  Last Vitals:  Vitals Value Taken Time  BP 124/74 06/07/2018  5:57 PM  Temp    Pulse 101 06/07/2018  6:01 PM  Resp 13 06/07/2018  6:01 PM  SpO2 100 % 06/07/2018  6:01 PM  Vitals shown include unvalidated device data.  Last Pain:  Vitals:   06/07/18 0815  TempSrc:   PainSc: 0-No pain         Complications: No apparent anesthesia complications

## 2018-06-07 NOTE — Op Note (Addendum)
06/07/2018  6:02 PM  PATIENT:  [name], 84 y.o., female, MRN: [id number]  PREOP DIAGNOSIS:  Diaphragmatic Hernia  POSTOP DIAGNOSIS:   Large hiatal hernia with 80% of the stomach in the chest [photos in paper chart]  PROCEDURE:   Procedure(s): LAPAROSCOPIC REPAIR OF HIATAL HERNIA,  LAPAROSCOPIC #20 GASTROSTOMY  SURGEON:   Alphonsa Overall, M.D.  ASSISTANT:   B. Grandville Silos, M.D.  ANESTHESIA:   general  Anesthesiologist: Roberts Gaudy, MD CRNA: Harden Mo, CRNA; Lance Coon, CRNA; White, Amedeo Plenty, CRNA  General  EBL:  minimal  ml  BLOOD ADMINISTERED: none  DRAINS: none   LOCAL MEDICATIONS USED:   20 cc of exparel and 20 cc of 1/4% marcaine  SPECIMEN:   Hernia sac  COUNTS CORRECT:  YES  INDICATIONS FOR PROCEDURE:  [name] is a 83 y.o. (DOB: 1926-05-13) white female whose primary care physician is Deland Pretty, MD and comes for repair of large hiatal hernia.   The indications and risks of the surgery were explained to the patient.  The risks include, but are not limited to, infection, bleeding, and nerve injury.  PROCEDURE:  The patient was taken to OR room #1 and given a general anesthetic. She already had an NG tube in place. Her abdomen was prepped with ChloraPrep and sterilely draped.   A timeout was held and surgical checklist run.   I accessed her abdominal cavity in the left upper quadrant with a 5 mm Ethicon Optiview trocar. I placed 5 additional trohars. I placed a 5 mm trocar in the right upper quadrant, a 11 mm trocar in the right paramedian area, a 5 mm trocar in the left paramedian area for the camera, a 5 mm lateral left trocarf or my assistant, and a 5 mm trocar for the liver retractor in the subxiphoid location.   An abdominal expiration was carried out. Both lobes of the liver were unremarkable. The gallbladder was unremarkable. Much of the stomach was through a large hiatal hernia into the chest. The rest of her bowel was  unremarkable.   I reduced the stomach into the abdominal cavity. Using the harmonic scalpel, I dissected out the left crus, the anterior hiatus, and the right crus. I divided some of the short gastrics to get the spleen away from the dissection and help bring down the stomach.   I passed a 1 inch Penrose drain around the proximal stomach to retract down the stomach. I dissected out the hernia sac which was primarily towards the left chest. I was able to mobilize the esophagus down about 2 cm into the abdominal cavity. I then exposed the left and right crus posteriorly. I used a 0 Ethibond suture and placed 4 0 Ethibond sutures approximating the left and right crus posteriorly.  This closed about 50% of the hiatal defect.  These sutures were secured with Ty Knots.  I left about a 3-4 cm hiatus anteriorly that appeared to be ample room for the esophagus.   I then placed a laparoscopic gastrostomy tube. I used 3 #1 PDS sutures through the anterior wall the stomach and captured through the anterior wall the abdomen in a triangular pattern.  I then made a pursestring with a #1 Vicryl in the anterior wall the stomach. Using the site of the 5 mm trocar in the left midaxillary line, I passed a 20 [place] gastrostomy tube through the abdominal wall and into the anterior wall stomach. I blew up the balloon on the gastrostomy  tube. I tied the Vicryl suture pursestring around the balloon. And then cinched the PDS sutures securing the anterior wall of the stomach to the anterior peritoneum.   I rechecked the hiatus, the stomach that I dissected, and the gastrostomy tube placement. There was no bleeding. I irrigated the abdomen with about 800 mL of saline. I infiltrated 40 mL of mixture of Exparel and quarter percent Marcaine around each wound and then sort of a tap block fashion in the left and right abdominal wall. I then removed the trochars. I closed the trocar wounds with a 4-0 Monocryl suture. I painted the wounds  with Dermabond. The gastrostomy tube attachment device was sewn in place with a 2-0 nylon suture.    The sponge and needle count were correct at the end of the case.   The patient was transferred to the recovery room and will be placed in the stepdown ICU overnight.    I have a surgeon as a first assist to retract, expose, and assist on this difficult operation.   Pictures in reverse order: 4 suture posterior repair   Right and left cruz posteriorly   Hiatal hernia (photos are dark)   Alphonsa Overall, MD, [facility] Surgery Pager: [phone] Office phone:  [phone]

## 2018-06-07 NOTE — Progress Notes (Signed)
Mouth care given at 0800, 0900, 1000, 1100, 1200,1300. Patient's mouth was dry from being NPO.

## 2018-06-07 NOTE — Progress Notes (Signed)
Patient received from PACU A&O, VSS. Telemetry applied and CCMD notified.

## 2018-06-07 NOTE — Progress Notes (Signed)
Report given to OR nurse

## 2018-06-07 NOTE — Progress Notes (Signed)
Initial Nutrition Assessment  DOCUMENTATION CODES:   Not applicable  INTERVENTION:  Continue TPN per pharmacy  Will be increased to 52mL/hr tonight, providing 1279 calories and 68 grams of protein  Goal rate of 71mL/hr  Will monitor for initiation of tubefeeds.  NUTRITION DIAGNOSIS:   Inadequate oral intake related to inability to eat, nausea, vomiting as evidenced by per patient/family report, NPO status.  GOAL:   Patient will meet greater than or equal to 90% of their needs  MONITOR:   TF tolerance, I & O's, Weight trends, Diet advancement  REASON FOR ASSESSMENT:   Consult New TPN/TNA  ASSESSMENT:   Patient has a PMH of HTN, HLD, GERD, mitral valve regurgitation, CHF, hiatal hernia, who presents with intractable nausea, vomiting, abdominal and chest pain likely related to hiatal hernia that contains most of her stomach in the sac.She vomited all day 7/5, had coffee ground emesis 7/6 with NGT inserted to suction which led to an improvement in her symptoms. EGD done 7/7 that found type III hiatal hernia with inflammation and erosion but no evidence of ulcer or active bleeding. Started on TPN on 7/8 due to inability to take PO. Surgical hiatal hernia repair and gastrostomy tube placement are likely next steps being discussed with the family at this point.   Spoke with patient this morning. She reports a UBW of 155 pounds, denies any weight loss. Noted admission weight of 152 pounds which is likely related to several days of nausea and vomiting. She reports a usual PO intake of eggs, bacon, toast, oatmeal, apple juice, and milk for breakfast, and meat and vegetables for lunch and dinner. Occassionally eats a snack. Drinks ensure twice a day. Per pharmacy note, patient was eating 1/2-1/3 of her meal trays PTA.  Per NFPE, exhibits some muscle wasting in her lower extremities, but she is wheelchair bound at this time. Does not seem to be malnourished at this time.  Currently she is  on TPN, but is in the OR and will have a G-tube placed.  Labs reviewed:  K+ 3.0, PO4 2.0  Medications reviewed and include:  Insulin, Reglan  NS at 38mL/hr 10MeQ K+ every hour for 5 hours IV PO4 IV Magnesium  NUTRITION - FOCUSED PHYSICAL EXAM:    Most Recent Value  Orbital Region  Moderate depletion  Upper Arm Region  No depletion  Thoracic and Lumbar Region  No depletion  Buccal Region  Mild depletion  Temple Region  Moderate depletion  Clavicle Bone Region  No depletion  Clavicle and Acromion Bone Region  No depletion  Scapular Bone Region  Unable to assess  Dorsal Hand  Mild depletion  Patellar Region  Moderate depletion  Anterior Thigh Region  Moderate depletion  Posterior Calf Region  Moderate depletion  Edema (RD Assessment)  Mild       Diet Order:   Diet Order           Diet NPO time specified Except for: Sips with Meds  Diet effective now          EDUCATION NEEDS:   No education needs have been identified at this time  Skin:  Skin Assessment: Reviewed RN Assessment  Last BM:  06/06/2018  Height:   Ht Readings from Last 1 Encounters:  06/03/18 5\' 1"  (1.549 m)    Weight:   Wt Readings from Last 1 Encounters:  06/03/18 152 lb 5.4 oz (69.1 kg)    Ideal Body Weight:  47.72 kg  BMI:  Body mass  index is 28.78 kg/m.  Estimated Nutritional Needs:   Kcal:  1500-1800 calories  Protein:  83-104 grams   Fluid:  >1.5L    Satira Anis. Lachrista Heslin, MS, RD LDN Inpatient Clinical Dietitian Pager [phone]

## 2018-06-07 NOTE — Progress Notes (Signed)
PHARMACY - ADULT TOTAL PARENTERAL NUTRITION CONSULT NOTE   Pharmacy Consult:  TPN Indication:  Obstruction from hiatal hernia  Patient Measurements: Height: 5\' 1"  (154.9 cm) Weight: 152 lb 5.4 oz (69.1 kg) IBW/kg (Calculated) : 47.8 TPN AdjBW (KG): 53.1 Body mass index is 28.78 kg/m. Usual Weight: 68-70 kg  Assessment:  [name] presented [facility]18 with intractable nausea, vomiting, and abdominal/chest pain.  Patient has a history of a large hiatal hernia but is not a good surgical candidate.  She started to have coffee-ground emesis 06/04/18 and EGD showed no evidence of ulcer or active bleeding.    Patient denies recent weight loss and is at her usual weight.  She was eating 1/3 - 1/2 of her meal trays PTA.  She states that feeding tube is uncomfortable but she will give it a try if needed to maintain her nutrition.  Spoke to [facility], placing a post-pyloric feeding tube is not feasible d/t anatomy/hernia.  Even with surgery, patient will be not be able to have adequate nutrition anytime soon.  Pharmacy consulted to manage TPN.  GI: GERD/hiatal hernia.  BL prealbumin 14.5, NG O/P 959mL, LBM 7/8 - Reglan, PPI IV Endo: no hx DM - CBGs controlled Insulin requirements in the past 24 hours: 0 unit (1 unit since TPN started) Lytes: lytes trended down post TPN - mild refeeding vs loss from NGT Renal: SCr 0.6 stable, BUN WNL - UOP 0.9 ml/kg/hr, NS at 45/hr Pulm: stable on RA Cards: HTN/HLD/CHF - BP elevated - PRN hydralazine Hepatobil: LFTs / tbili / TG WNL Neuro: A&O ID: s/p 4d CTX for E.coli UTI, CoNS in BCx (?contaminant) - afebrile, WBC down 10.9 TPN Access: PICC placed 06/05/18 TPN start date: 06/06/18  Nutritional Goals (RD rec pending): 1700-1850 kCal and 85-100gm protein per day  Current Nutrition:  TPN   Plan:  Increase TPN to 50 ml/hr (goal ~70 ml/hr) TPN will provide 68g AA, 204g CHO and 31g ILE for a total of 1279 kCal, meeting ~70% of patient's needs. Electrolytes in  TPN: Cl:Ac 1:1 - increase lytes with increasing TPN rate Daily multivitamin and trace elements in TPN Reduce IVF to 25 ml/hr when today's TPN starts Continue sensitive SSI Q6H.  D/C if CBGs remain controlled at goal TPN rate.  KPhos 20 mmol IV x 1 KCL x 5 runs Mag sulfate 2gm IV x 1  F/U AM labs, hiatal hernia repair with G-tube placement and ability to start TF   Sanyia Dini D. Mina Marble, PharmD, BCPS, [place] 06/07/2018, 7:33 AM

## 2018-06-07 NOTE — Anesthesia Postprocedure Evaluation (Signed)
Anesthesia Post Note  Patient: KYOKO ELSEA  Procedure(s) Performed: LAPAROSCOPIC REPAIR OF HIATAL HERNIA (N/A ) LAPAROSCOPIC GASTROSTOMY (N/A )     Patient location during evaluation: PACU Anesthesia Type: General Level of consciousness: awake and alert Pain management: pain level controlled Vital Signs Assessment: post-procedure vital signs reviewed and stable Respiratory status: spontaneous breathing, nonlabored ventilation, respiratory function stable and patient connected to nasal cannula oxygen Cardiovascular status: blood pressure returned to baseline and stable Postop Assessment: no apparent nausea or vomiting Anesthetic complications: no    Last Vitals:  Vitals:   06/07/18 1815 06/07/18 1830  BP: (!) 135/96 (!) 142/67  Pulse: 92 88  Resp: 14 15  Temp:    SpO2: 97% 97%    Last Pain:  Vitals:   06/07/18 1830  TempSrc:   PainSc: Asleep                 Anabeth Chilcott COKER

## 2018-06-07 NOTE — Anesthesia Preprocedure Evaluation (Addendum)
Anesthesia Evaluation  Patient identified by MRN, date of birth, ID band Patient awake    Reviewed: Allergy & Precautions, NPO status , Patient's Chart, lab work & pertinent test results  Airway Mallampati: II  TM Distance: >3 FB Neck ROM: Full    Dental  (+) Dental Advisory Given, Edentulous Upper, Poor Dentition, Missing, Chipped,    Pulmonary former smoker,    breath sounds clear to auscultation       Cardiovascular hypertension,  Rhythm:Regular Rate:Normal     Neuro/Psych    GI/Hepatic   Endo/Other    Renal/GU      Musculoskeletal   Abdominal (+) - obese,   Peds  Hematology   Anesthesia Other Findings   Reproductive/Obstetrics                            Anesthesia Physical Anesthesia Plan  ASA: II  Anesthesia Plan: General   Post-op Pain Management:    Induction: Intravenous  PONV Risk Score and Plan: Ondansetron and Dexamethasone  Airway Management Planned: Oral ETT  Additional Equipment:   Intra-op Plan:   Post-operative Plan: Possible Post-op intubation/ventilation  Informed Consent: I have reviewed the patients History and Physical, chart, labs and discussed the procedure including the risks, benefits and alternatives for the proposed anesthesia with the patient or authorized representative who has indicated his/her understanding and acceptance.   Dental advisory given  Plan Discussed with: Anesthesiologist and CRNA  Anesthesia Plan Comments:        Anesthesia Quick Evaluation

## 2018-06-07 NOTE — Progress Notes (Signed)
PROGRESS NOTE                                                                                                                                                                                                             Patient Demographics:    [name], is a 84 y.o. female, DOB - September 17, 1926, ZOX:[id number]  Admit date - 06/02/2018   Admitting Physician Ivor Costa, MD  Outpatient Primary MD for the patient is Deland Pretty, MD  LOS - 3   Chief Complaint  Patient presents with  . Chest Pain  . Abdominal Pain       Brief Narrative    [name] is a 84 y.o. female with medical history significant of hypertension, hyperlipidemia, GERD, mitral valve regurg, dCHF, hiatal hernia, who presents with intractable nausea, vomiting, abdominal pain, and chest pain. Work-up significant for urinary retention, mild UTI, she continues to have nausea and vomiting during hospital stay, apparently this is secondary to large hiatal hernia most of the stomach and her sac, and continues to have persistent nausea vomiting, coffee-ground emesis, improved after NGT insertion with suction, went for endoscopy 06/05/2018, significant for large type III hiatal hernia, with inflammation and erosion, but no evidence of ulcer or active bleeding, possibility of partial torsion cannot be ruled out, as well advancing the scope into the gastric antrum was difficult , patient is followed by general surgery as well .plan for hernia repair with possible PEG.   Subjective:    [name] today  Denies any N/V/abd pain   Assessment  & Plan :    Principal Problem:   Hiatal hernia Active Problems:   Leukocytosis   Essential hypertension, benign   Chest pain   Abdominal pain   GERD (gastroesophageal reflux disease)   Elevated lactic acid level   Chronic diastolic CHF (congestive heart failure) (HCC)   SIRS (systemic inflammatory response syndrome) (HCC)    Intractable  nausea and vomiting secondary to large type III hiatal hernia -Elderly, frail, provement with n.p.o. status, required NGT insertion with significant improvement of symptoms. -seen by [facility], s/p EGD 06/05/2018 significant for significant for large type III hiatal hernia, with inflammation and erosion, but no evidence of ulcer or active bleeding, possibility of partial torsion cannot be ruled out, as well advancing the scope into the gastric antrum was  difficult, with narrowing of stomach body at hiatal hernia side, likely causing some sort of obstruction causing her symptoms. -Continue with n.p.o. status for now, continue with IV fluids, continue with NGT on ILS, with Protonix IV twice daily. - PICC line inserted, to be started on TPN.as she is with no oral intake for 5 days. -General Surgery on board.   General surgery D/W patient and her family yesterday 7/8 and they would like to proceed with hiatal hernia repair, probably gastrostomy tube. Plan for surgery today.   Chest pain with elevated troponins -cardiology nput greatly appreciated, this is most likely in the setting of mild ischemia, 2D echo with normal EF, and without any regional wall motion abnormalities, no further work-up per cardiology . -pt is allergic to ASA -she  had 8-second run of NSVT 06/04/2018, no recurrence, will correct her hypokalemia, continue with telemetry monitoring  Sepsis secondary to UTI/ -Sepsis criteria met on admission, leukocytosis, tachycardic and elevated lactic acid -Leukocytosis most likely related to her vomiting, she had elevated lactic acid as well, work-up significant for UTI urine culture growing E. coli, treated with Rocephin - 1 out of 4 bottles blood culture growing coag negative Staphylococcus, contaminant, no indication to treat  urinary retention -Patient had urinary retention required Foley catheter insertion  Essential hypertension: -Continue to hold home meds as she is n.p.o., continue  with management with PRN hydralazine for now  GERD (gastroesophageal reflux disease) -Coffee ground emesis, but no evidence of bleed ulcer or gastritis on endoscopy, continue with PPI  Chronic diastolic CHF (congestive heart failure) ([place]):  -2D echo on 05/06/2014 showed EF of 55 with grade 1 diastolic dysfunction.  Patient does not have leg edema.  No shortness of breath.  CHF seems to be compensated. - Hold spironolactone  Hypokalemia/hypophosphatemia/ hypomagnesemia -Repleted, repleted, to be corrected with TPN per pharmacy    Code Status : DNR  Family Communication  : Discussed with daughter and son-in-law at bedside 06/05/2018  Disposition Plan  : SNF  Consults  :  cardiology, GI, general surgery  Procedures  : none  DVT Prophylaxis  : Resume on subcu heparin given no evidence of GI bleed on endoscopy  Lab Results  Component Value Date   PLT 298 06/07/2018    Antibiotics  :    Anti-infectives (From admission, onward)   Start     Dose/Rate Route Frequency Ordered Stop   06/07/18 0600  cefoTEtan in Dextrose 5% (CEFOTAN) IVPB 2 g     2 g 100 mL/hr over 30 Minutes Intravenous On call to O.R. 06/06/18 2009 06/08/18 0559   06/03/18 0900  ciprofloxacin (CIPRO) IVPB 400 mg  Status:  Discontinued     400 mg 200 mL/hr over 60 Minutes Intravenous Every 12 hours 06/03/18 0757 06/03/18 0759   06/03/18 0800  cefTRIAXone (ROCEPHIN) 1 g in sodium chloride 0.9 % 100 mL IVPB  Status:  Discontinued     1 g 200 mL/hr over 30 Minutes Intravenous Every 24 hours 06/03/18 0759 06/06/18 1135        Objective:   Vitals:   06/06/18 1300 06/06/18 2100 06/07/18 0008 06/07/18 0500  BP: 128/61 (!) 179/95 (!) 148/67 (!) 166/78  Pulse: 75 85 85 78  Resp: _0 Temp: 98.1 F (36.7 C) (!) 97.5 F (36.4 C) 98 F (36.7 C) 98.1 F (36.7 C)  TempSrc: Oral Oral Oral Oral  SpO2: 98% 98% 95% 96%  Weight:      Height:  Wt Readings from Last 3 Encounters:  06/03/18 69.1 kg (152  lb 5.4 oz)  03/22/18 73.9 kg (163 lb)  10/06/16 70.9 kg (156 lb 6.4 oz)     Intake/Output Summary (Last 24 hours) at 06/07/2018 1215 Last data filed at 06/07/2018 1015 Gross per 24 hour  Intake 1512.15 ml  Output 2801 ml  Net -1288.85 ml     Physical Exam  Frail chronically ill-appearing female, laying in bed .,  No apparent distress awake Alert, Oriented X 3, No new Good air movement bilaterally, CTAB RRR,No Gallops,Rubs or new Murmurs, No Parasternal Heave +ve B.Sounds, Abd Soft, epigastric tenderness, No rebound - guarding or rigidity. No Cyanosis, Clubbing or edema, No new Rash or bruise      Data Review:    CBC Recent Labs  Lab 06/04/18 0640 06/04/18 1132 06/05/18 0615 06/06/18 0505 06/07/18 0446  WBC 16.0* 17.8* 10.3 11.8* 10.9*  HGB 12.2 11.5* 10.1* 9.6* 9.7*  HCT 36.8 34.8* 31.1* 30.3* 30.1*  PLT 370 352 320 288 298  MCV 89.8 89.7 91.5 93.2 91.8  MCH 29.8 29.6 29.7 29.5 29.6  MCHC 33.2 33.0 32.5 31.7 32.2  RDW 12.8 12.7 12.9 12.9 12.8  LYMPHSABS  --   --   --   --  1.4  MONOABS  --   --   --   --  1.2*  EOSABS  --   --   --   --  0.2  BASOSABS  --   --   --   --  0.0    Chemistries  Recent Labs  Lab 06/02/18 2234 06/03/18 0606 06/04/18 0640 06/05/18 0615 06/06/18 0505 06/07/18 0446  NA  --  135 137 140 141 139  K  --  4.0 3.4* 2.9* 3.1* 3.0*  CL  --  97* 98 105 107 104  CO2  --  _0 GLUCOSE  --  139* 138* 93 77 127*  BUN  --  14 15 27* 25* 16  CREATININE  --  0.75 0.80 0.72 0.70 0.60  CALCIUM  --  8.8* 9.2 8.3* 8.1* 8.2*  MG  --   --   --  2.2  --  1.9  AST 23  --   --   --   --  14*  ALT 14  --   --   --   --  12  ALKPHOS 78  --   --   --   --  60  BILITOT 0.7  --   --   --   --  0.7   ------------------------------------------------------------------------------------------------------------------ Recent Labs    06/07/18 0446  TRIG 97    Lab Results  Component Value Date   HGBA1C 5.5 06/03/2018    ------------------------------------------------------------------------------------------------------------------ No results for input(s): TSH, T4TOTAL, T3FREE, THYROIDAB in the last 72 hours.  Invalid input(s): FREET3 ------------------------------------------------------------------------------------------------------------------ No results for input(s): VITAMINB12, FOLATE, FERRITIN, TIBC, IRON, RETICCTPCT in the last 72 hours.  Coagulation profile Recent Labs  Lab 06/04/18 1513  INR 1.21    No results for input(s): DDIMER in the last 72 hours.  Cardiac Enzymes Recent Labs  Lab 06/03/18 0254 06/03/18 0606 06/03/18 1204  TROPONINI 0.05* 0.07* 0.11*   ------------------------------------------------------------------------------------------------------------------    Component Value Date/Time   BNP 567.9 (H) 06/03/2018 0254    Inpatient Medications  Scheduled Meds: . cefoTEtan (CEFOTAN) IV  2 g Intravenous On Call to OR  . chlorhexidine  15 mL Mouth Rinse BID  .  heparin injection (subcutaneous)  5,000 Units Subcutaneous Q8H  . insulin aspart  0-9 Units Subcutaneous Q6H  . mouth rinse  15 mL Mouth Rinse q12n4p  . metoCLOPramide (REGLAN) injection  5 mg Intravenous Q8H  . pantoprazole  40 mg Intravenous Q12H   Continuous Infusions: . sodium chloride Stopped (06/06/18 0838)  . sodium chloride 45 mL/hr at 06/07/18 1015  . sodium chloride    . magnesium sulfate 1 - 4 g bolus IVPB    . potassium chloride 10 mEq (06/07/18 1121)  . potassium PHOSPHATE IVPB (in mmol)    . TPN ADULT (ION) 30 mL/hr at 06/07/18 1015  . TPN ADULT (ION)     PRN Meds:.acetaminophen, fentaNYL (SUBLIMAZE) injection, hydrALAZINE, nitroGLYCERIN, ondansetron (ZOFRAN) IV, sodium chloride flush  Micro Results Recent Results (from the past 240 hour(s))  Culture, blood (x 2)     Status: None (Preliminary result)   Collection Time: 06/03/18  2:30 AM  Result Value Ref Range Status   Specimen  Description BLOOD RIGHT ARM  Final   Special Requests   Final    BOTTLES DRAWN AEROBIC AND ANAEROBIC Blood Culture adequate volume   Culture   Final    NO GROWTH 3 DAYS Performed at [facility], [place] [address], [place], [place] [address]    Report Status PENDING  Incomplete  Culture, blood (x 2)     Status: Abnormal   Collection Time: 06/03/18  2:50 AM  Result Value Ref Range Status   Specimen Description BLOOD LEFT ARM  Final   Special Requests   Final    BOTTLES DRAWN AEROBIC AND ANAEROBIC Blood Culture adequate volume   Culture  Setup Time   Final    AEROBIC BOTTLE ONLY GRAM POSITIVE COCCI CRITICAL RESULT CALLED TO, READ BACK BY AND VERIFIED WITH: PHARMD N BATCHELDER 06/04/18 AT 73 BY CM    Culture (A)  Final    STAPHYLOCOCCUS SPECIES (COAGULASE NEGATIVE) THE SIGNIFICANCE OF ISOLATING THIS ORGANISM FROM A SINGLE SET OF BLOOD CULTURES WHEN MULTIPLE SETS ARE DRAWN IS UNCERTAIN. PLEASE NOTIFY THE MICROBIOLOGY DEPARTMENT WITHIN ONE WEEK IF SPECIATION AND SENSITIVITIES ARE REQUIRED. Performed at [facility], [place] [address], [place], [place] [address]    Report Status 06/06/2018 FINAL  Final  Blood Culture ID Panel (Reflexed)     Status: Abnormal   Collection Time: 06/03/18  2:50 AM  Result Value Ref Range Status   Enterococcus species NOT DETECTED NOT DETECTED Final   Listeria monocytogenes NOT DETECTED NOT DETECTED Final   Staphylococcus species DETECTED (A) NOT DETECTED Final    Comment: Methicillin (oxacillin) susceptible coagulase negative staphylococcus. Possible blood culture contaminant (unless isolated from more than one blood culture draw or clinical case suggests pathogenicity). No antibiotic treatment is indicated for blood  culture contaminants. CRITICAL RESULT CALLED TO, READ BACK BY AND VERIFIED WITH: PHARMD N BETCHELDER 06/04/18 AT 812 BY CM    Staphylococcus aureus NOT DETECTED NOT DETECTED Final   Methicillin resistance NOT DETECTED NOT DETECTED Final     Streptococcus species NOT DETECTED NOT DETECTED Final   Streptococcus agalactiae NOT DETECTED NOT DETECTED Final   Streptococcus pneumoniae NOT DETECTED NOT DETECTED Final   Streptococcus pyogenes NOT DETECTED NOT DETECTED Final   Acinetobacter baumannii NOT DETECTED NOT DETECTED Final   Enterobacteriaceae species NOT DETECTED NOT DETECTED Final   Enterobacter cloacae complex NOT DETECTED NOT DETECTED Final   Escherichia coli NOT DETECTED NOT DETECTED Final   Klebsiella oxytoca NOT DETECTED NOT  DETECTED Final   Klebsiella pneumoniae NOT DETECTED NOT DETECTED Final   Proteus species NOT DETECTED NOT DETECTED Final   Serratia marcescens NOT DETECTED NOT DETECTED Final   Haemophilus influenzae NOT DETECTED NOT DETECTED Final   Neisseria meningitidis NOT DETECTED NOT DETECTED Final   Pseudomonas aeruginosa NOT DETECTED NOT DETECTED Final   Candida albicans NOT DETECTED NOT DETECTED Final   Candida glabrata NOT DETECTED NOT DETECTED Final   Candida krusei NOT DETECTED NOT DETECTED Final   Candida parapsilosis NOT DETECTED NOT DETECTED Final   Candida tropicalis NOT DETECTED NOT DETECTED Final    Comment: Performed at [facility], [place] [address], [place], [place] [address]  MRSA PCR Screening     Status: None   Collection Time: 06/03/18  4:54 AM  Result Value Ref Range Status   MRSA by PCR NEGATIVE NEGATIVE Final    Comment:        The GeneXpert MRSA Assay (FDA approved for NASAL specimens only), is one component of a comprehensive MRSA colonization surveillance program. It is not intended to diagnose MRSA infection nor to guide or monitor treatment for MRSA infections. Performed at [facility], [place] [address], [place], [place] [address]   Culture, Urine     Status: Abnormal   Collection Time: 06/03/18 10:45 AM  Result Value Ref Range Status   Specimen Description URINE, CATHETERIZED  Final   Special Requests   Final    NONE Performed at [facility],  [place] [address] [place], [place] [address]    Culture 30,000 COLONIES/mL ESCHERICHIA COLI (A)  Final   Report Status 06/05/2018 FINAL  Final   Organism ID, Bacteria ESCHERICHIA COLI (A)  Final      Susceptibility   Escherichia coli - MIC*    AMPICILLIN >=32 RESISTANT Resistant     CEFAZOLIN >=64 RESISTANT Resistant     CEFTRIAXONE <=1 SENSITIVE Sensitive     CIPROFLOXACIN <=0.25 SENSITIVE Sensitive     GENTAMICIN <=1 SENSITIVE Sensitive     IMIPENEM <=0.25 SENSITIVE Sensitive     NITROFURANTOIN 32 SENSITIVE Sensitive     TRIMETH/SULFA >=320 RESISTANT Resistant     AMPICILLIN/SULBACTAM >=32 RESISTANT Resistant     PIP/TAZO >=128 RESISTANT Resistant     Extended ESBL NEGATIVE Sensitive     * 30,000 COLONIES/mL ESCHERICHIA COLI  Surgical pcr screen     Status: None   Collection Time: 06/06/18 11:24 PM  Result Value Ref Range Status   MRSA, PCR NEGATIVE NEGATIVE Final   Staphylococcus aureus NEGATIVE NEGATIVE Final    Comment: (NOTE) The Xpert SA Assay (FDA approved for NASAL specimens in patients 24 years of age and older), is one component of a comprehensive surveillance program. It is not intended to diagnose infection nor to guide or monitor treatment. Performed at [facility], [place] [address], [place], [place] [address]     Radiology Reports Dg Chest 2 View  Result Date: 06/02/2018 CLINICAL DATA:  Chest pain and vomiting. EXAM: CHEST - 2 VIEW COMPARISON:  03/19/2018 and chest CT 08/05/2015 FINDINGS: Lungs are somewhat hypoinflated demonstrate mild opacification over the left base which may be due to small amount of pleural fluid/atelectasis. Mild prominence of the perihilar markings suggesting mild degree of vascular congestion. Mild stable cardiomegaly. Stable elevation of the left hemidiaphragm with large hiatal hernia unchanged. Remainder of the exam is unchanged. IMPRESSION: Mild cardiomegaly with suggestion of mild vascular congestion. Mild left base opacification likely  small  effusion with atelectasis. Large hiatal hernia unchanged. Electronically Signed   By: Marin Olp M.D.   On: 06/02/2018 21:42   Ct Abdomen Pelvis W Contrast  Result Date: 06/02/2018 CLINICAL DATA:  84 year old female with acute abdominal pain and vomiting today. EXAM: CT ABDOMEN AND PELVIS WITH CONTRAST TECHNIQUE: Multidetector CT imaging of the abdomen and pelvis was performed using the standard protocol following bolus administration of intravenous contrast. CONTRAST:  130m OMNIPAQUE IOHEXOL 300 MG/ML  SOLN COMPARISON:  05/20/2016 and prior CTs FINDINGS: Lower chest: LEFT basilar atelectasis noted. Hepatobiliary: The liver and gallbladder are unremarkable. Pancreas: Unremarkable Spleen: Unremarkable Adrenals/Urinary Tract: Moderate to marked distention of bladder again noted. Kidneys and adrenal glands are unremarkable. Stomach/Bowel: A large hiatal hernia is noted with a distended stomach in the LOWER LEFT chest/UPPER abdomen-but unchanged in appearance and configuration since 08/05/2015 No dilated small bowel loops identified. No colonic abnormalities are identified. Vascular/Lymphatic: Aortic atherosclerosis. No enlarged abdominal or pelvic lymph nodes. Reproductive: Uterus and bilateral adnexa are unremarkable. Other: No ascites, pneumoperitoneum or abscess. A moderate paraumbilical hernia containing fat is present. Musculoskeletal: No acute abnormalities identified. Lumbar scoliosis and moderate to severe multilevel degenerative changes again noted. LEFT hip arthroplasty again identified. IMPRESSION: 1. Large hiatal hernia with distended stomach in the LOWER LEFT chest/UPPER abdomen with unchanged appearance and configuration of the stomach when compared to 08/05/2015. 2. Distended bladder. 3. Moderate paraumbilical hernia containing fat 4.  Aortic Atherosclerosis (ICD10-I70.0). Electronically Signed   By: JMargarette CanadaM.D.   On: 06/02/2018 23:28   Dg Abd Portable 1v  Result Date:  06/04/2018 CLINICAL DATA:  Orogastric tube placement EXAM: PORTABLE ABDOMEN - 1 VIEW COMPARISON:  CT abdomen and pelvis June 02, 2018 FINDINGS: Orogastric tube tip and side port are in the proximal stomach. Note that the left hemidiaphragm is elevated in the stomach is unusually superior in position as is demonstrated on recent CT. There is moderate stool in the colon. There is no bowel dilatation or air-fluid level to suggest bowel obstruction. No free air. There is degenerative change in the lower thoracic and lumbar spine regions. IMPRESSION: Orogastric tube tip and side port in proximal stomach. Stomach is present inferior to an elevated left hemidiaphragm. No bowel obstruction or free air evident. Moderate stool noted in colon. Electronically Signed   By: WLowella GripIII M.D.   On: 06/04/2018 14:39   [place]orea[place]kg Site Rite  Result Date: 06/05/2018 If Site Rite image not attached, placement could not be confirmed due to current cardiac rhythm.    DPhillips ClimesM.D on 06/07/2018 at 12:15 PM  Between 7am to 7pm - Pager - [phone] After 7pm go to [email or web address] - password [facility] -  Office  [phone]

## 2018-06-08 ENCOUNTER — Encounter (HOSPITAL_COMMUNITY): Payer: Self-pay | Admitting: Surgery

## 2018-06-08 DIAGNOSIS — R338 Other retention of urine: Secondary | ICD-10-CM

## 2018-06-08 DIAGNOSIS — N390 Urinary tract infection, site not specified: Secondary | ICD-10-CM

## 2018-06-08 LAB — MAGNESIUM: Magnesium: 2.3 mg/dL (ref 1.7–2.4)

## 2018-06-08 LAB — CBC WITH DIFFERENTIAL/PLATELET
Abs Immature Granulocytes: 0.1 10*3/uL (ref 0.0–0.1)
Basophils Absolute: 0 10*3/uL (ref 0.0–0.1)
Basophils Relative: 0 %
Eosinophils Absolute: 0 10*3/uL (ref 0.0–0.7)
Eosinophils Relative: 0 %
HEMATOCRIT: 30.9 % — AB (ref 36.0–46.0)
HEMOGLOBIN: 9.9 g/dL — AB (ref 12.0–15.0)
Immature Granulocytes: 1 %
LYMPHS ABS: 0.6 10*3/uL — AB (ref 0.7–4.0)
LYMPHS PCT: 4 %
MCH: 29.5 pg (ref 26.0–34.0)
MCHC: 32 g/dL (ref 30.0–36.0)
MCV: 92 fL (ref 78.0–100.0)
MONOS PCT: 7 %
Monocytes Absolute: 1.2 10*3/uL — ABNORMAL HIGH (ref 0.1–1.0)
NEUTROS PCT: 88 %
Neutro Abs: 15.8 10*3/uL — ABNORMAL HIGH (ref 1.7–7.7)
Platelets: 317 10*3/uL (ref 150–400)
RBC: 3.36 MIL/uL — ABNORMAL LOW (ref 3.87–5.11)
RDW: 13.2 % (ref 11.5–15.5)
WBC: 17.7 10*3/uL — AB (ref 4.0–10.5)

## 2018-06-08 LAB — GLUCOSE, CAPILLARY
Glucose-Capillary: 177 mg/dL — ABNORMAL HIGH (ref 70–99)
Glucose-Capillary: 142 mg/dL — ABNORMAL HIGH (ref 70–99)
Glucose-Capillary: 124 mg/dL — ABNORMAL HIGH (ref 70–99)

## 2018-06-08 LAB — BASIC METABOLIC PANEL
ANION GAP: 10 (ref 5–15)
BUN: 15 mg/dL (ref 8–23)
CALCIUM: 8.2 mg/dL — AB (ref 8.9–10.3)
CO2: 24 mmol/L (ref 22–32)
Chloride: 102 mmol/L (ref 98–111)
Creatinine, Ser: 0.63 mg/dL (ref 0.44–1.00)
GFR calc Af Amer: >60 mL/min (ref >60)
GLUCOSE: 192 mg/dL — AB (ref 70–99)
Potassium: 4.2 mmol/L (ref 3.5–5.1)
SODIUM: 136 mmol/L (ref 135–145)

## 2018-06-08 LAB — POCT I-STAT 4, (NA,K, GLUC, HGB,HCT)
Glucose, Bld: 532 mg/dL (ref 70–99)
HCT: 23 % — ABNORMAL LOW (ref 36.0–46.0)
HEMOGLOBIN: 7.8 g/dL — AB (ref 12.0–15.0)
Potassium: 6 mmol/L — ABNORMAL HIGH (ref 3.5–5.1)
Sodium: 134 mmol/L — ABNORMAL LOW (ref 135–145)

## 2018-06-08 LAB — CULTURE, BLOOD (ROUTINE X 2)
CULTURE: NO GROWTH
SPECIAL REQUESTS: ADEQUATE

## 2018-06-08 LAB — PHOSPHORUS: Phosphorus: 2.2 mg/dL — ABNORMAL LOW (ref 2.5–4.6)

## 2018-06-08 MED ORDER — INSULIN ASPART 100 UNIT/ML ~~LOC~~ SOLN
0.0000 [IU] | Freq: Four times a day (QID) | SUBCUTANEOUS | Status: DC
  Administered 2018-06-08 – 2018-06-10 (×4): 2 [IU] via SUBCUTANEOUS
  Administered 2018-06-11: 3 [IU] via SUBCUTANEOUS
  Administered 2018-06-11 – 2018-06-12 (×3): 2 [IU] via SUBCUTANEOUS

## 2018-06-08 MED ORDER — SODIUM CHLORIDE 0.9% FLUSH
10.0000 mL | Freq: Two times a day (BID) | INTRAVENOUS | Status: DC
  Administered 2018-06-08 – 2018-06-15 (×12): 10 mL
  Administered 2018-06-17: 20 mL
  Administered 2018-06-18: 10 mL
  Administered 2018-06-20: 20 mL
  Administered 2018-06-20: 10 mL
  Administered 2018-06-21: 20 mL
  Administered 2018-06-21 – 2018-06-24 (×6): 10 mL

## 2018-06-08 MED ORDER — SODIUM CHLORIDE 0.9 % IV SOLN
15.0000 mmol | Freq: Once | INTRAVENOUS | Status: AC
  Administered 2018-06-08: 15 mmol via INTRAVENOUS
  Filled 2018-06-08: qty 15

## 2018-06-08 MED ORDER — SODIUM CHLORIDE 0.9% FLUSH
10.0000 mL | INTRAVENOUS | Status: DC | PRN
  Administered 2018-06-09 – 2018-06-17 (×2): 10 mL
  Filled 2018-06-08 (×2): qty 40

## 2018-06-08 MED ORDER — ACETAMINOPHEN 10 MG/ML IV SOLN
1000.0000 mg | Freq: Four times a day (QID) | INTRAVENOUS | Status: AC
  Administered 2018-06-08 – 2018-06-09 (×4): 1000 mg via INTRAVENOUS
  Filled 2018-06-08 (×4): qty 100

## 2018-06-08 MED ORDER — SODIUM GLYCEROPHOSPHATE 1 MMOLE/ML IV SOLN
20.0000 mmol | Freq: Once | INTRAVENOUS | Status: DC
  Filled 2018-06-08: qty 20

## 2018-06-08 MED ORDER — TRAVASOL 10 % IV SOLN
INTRAVENOUS | Status: AC
  Administered 2018-06-08: 17:00:00 via INTRAVENOUS
  Filled 2018-06-08: qty 957.6

## 2018-06-08 NOTE — Progress Notes (Signed)
PROGRESS NOTE  [name] WJX:[id number] DOB: Jul 01, 1926 DOA: 06/02/2018 PCP: Deland Pretty, MD  Brief Narrative: 84 year old woman presented with nausea, vomiting, abdominal pain, chest pain secondary to large hiatal hernia.  Troponin was minimally elevated, patient was seen by cardiology, felt to have demand ischemia secondary to large hiatal hernia, pain.  Seen by gastroenterology, underwent EGD.  Assessment/Plan Type III hiatal hernia. S/p EGD 7/7 with findings as below.  Status post hernia repair and PEG tube placement 7/9.  Currently on TPN for nutrition.  Urinary retention, Foley catheter placed 7/5.  Urinary tract infection on admission with sepsis.  Treated with ceftriaxone. --Remove Foley 7/11  Chest pain, demand ischemia echo showed normal LVEF, no wall motion abnormalities.  Cardiology signed off.  No further recommendations.  Chronic diastolic congestive heart failure --Appears euvolemic   Continue management per surgery  DVT prophylaxis: heparin Code Status: DNR Family Communication: none Disposition Plan: SNF rec by PT    [name], MD  [facility] Direct contact: [phone] --Via amion app OR  --[email or web address]; password [email or web address]  7PM-7AM contact night coverage as above 06/08/2018, 5:11 PM  LOS: 4 days   Consultants:  General surgery  GI  Cardiology  Procedures:  EGD Impression:               - Large type-III paraesophageal hernia. possible                            partial torsion cannot be ruled out                           - Normal mucosa was found in the entire stomach. no                            evidence of active bleeding or ulcer disease.                           - Normal duodenal bulb, first portion of the                            duodenum and second portion of the duodenum.                           - No specimens collected.  PICC 7/7  LAPAROSCOPIC REPAIR OF HIATAL HERNIA,  LAPAROSCOPIC #20 GASTROSTOMY  Echo Study  Conclusions  - Left ventricle: The cavity size was normal. Wall thickness was   normal. Systolic function was normal. The estimated ejection   fraction was in the range of 55% to 60%. Wall motion was normal;   there were no regional wall motion abnormalities. Doppler   parameters are consistent with abnormal left ventricular   relaxation (grade 1 diastolic dysfunction). - Mitral valve: There was mild regurgitation. - Left atrium: The atrium was severely dilated. - Pulmonary arteries: Systolic pressure was mildly increased. PA   peak pressure: 44 mm Hg (S).  Impressions:  - Normal LV systolic function; mild diastolic dysfunction; mild MR;   severe LAE; mild TR with mild pulmonary hypertension.  Antimicrobials:  Ceftriaxone 7/5 >> 7/9  Cefotetan 7/10  Interval history/Subjective: Feels ok, breathing ok, no n/v.  Objective: Vitals:  Vitals:   06/08/18 0800 06/08/18 1200  BP: Marland Kitchen)  146/74 (!) 146/75  Pulse:  82  Resp: 14 14  Temp:    SpO2: 98% 100%    Exam:  Constitutional:  . Appears calm and comfortable Respiratory:  . CTA bilaterally, no w/r/r.  . Respiratory effort normal. Cardiovascular:  . RRR, no m/r/g . No LE extremity edema   Psychiatric:  . Mental status o Mood, affect appropriate  I have personally reviewed the following:   Labs:  Basic metabolic panel unremarkable.  Phosphorus minimally low.  Magnesium within normal limits.  Hemoglobin stable 9.9.  Scheduled Meds: . chlorhexidine  15 mL Mouth Rinse BID  . heparin injection (subcutaneous)  5,000 Units Subcutaneous Q8H  . insulin aspart  0-15 Units Subcutaneous Q6H  . mouth rinse  15 mL Mouth Rinse q12n4p  . metoCLOPramide (REGLAN) injection  5 mg Intravenous Q8H  . pantoprazole  40 mg Intravenous Q12H   Continuous Infusions: . sodium chloride 0 mL/hr at 06/06/18 0838  . sodium chloride 25 mL/hr at 06/07/18 2310  . acetaminophen 1,000 mg (06/08/18 1313)  . lactated ringers 10 mL/hr at  06/07/18 1410  . TPN ADULT (ION) 50 mL/hr at 06/07/18 1905  . TPN ADULT (ION) 70 mL/hr at 06/08/18 1709    Principal Problem:   Hiatal hernia Active Problems:   Essential hypertension, benign   GERD (gastroesophageal reflux disease)   Chronic diastolic CHF (congestive heart failure) ([place])   Acute lower UTI   Acute urinary retention   LOS: 4 days

## 2018-06-08 NOTE — Progress Notes (Signed)
PHARMACY - ADULT TOTAL PARENTERAL NUTRITION CONSULT NOTE   Pharmacy Consult:  TPN Indication:  Obstruction from hiatal hernia  Patient Measurements: Height: 5\' 1"  (154.9 cm) Weight: 152 lb 5.4 oz (69.1 kg) IBW/kg (Calculated) : 47.8 TPN AdjBW (KG): 53.1 Body mass index is 28.78 kg/m. Usual Weight: 68-70 kg  Assessment:  [name] presented from SNF on 06/02/18 with intractable nausea, vomiting, and abdominal/chest pain.  Patient has a history of a large hiatal hernia but is not a good surgical candidate.  She started to have coffee-ground emesis 06/04/18 and EGD showed no evidence of ulcer or active bleeding.    Patient denies recent weight loss and is at her usual weight.  She was eating 1/3 - 1/2 of her meal trays PTA.  She states that feeding tube is uncomfortable but she will give it a try if needed to maintain her nutrition.  Spoke to [facility], placing a post-pyloric feeding tube is not feasible d/t anatomy/hernia.  Even with surgery, patient will be not be able to have adequate nutrition anytime soon.  Pharmacy consulted to manage TPN.  GI: hx GERD/hiatal hernia.  S/p hernia repair with Gtube placement 7/9 - clamping today.  BL prealbumin 14.5, NG O/P 277mL (down), LBM 7/8 - Reglan, PPI IV Endo: no hx DM - CBGs trended up, surgery likely contributing Insulin requirements in the past 24 hours: 5 units SSI Lytes: all WNL except Phos b/c KPhos was not given yesterday Renal: SCr 0.6 stable, BUN WNL - UOP 0.6 ml/kg/hr, NS at 25/hr Pulm: RA >> 2L  Cards: HTN/HLD/CHF - VSS - PRN hydralazine Hepatobil: LFTs / tbili / TG WNL Neuro: A&O ID: s/p 4d CTX for E.coli UTI, CoNS in BCx (?contaminant) - afebrile, WBC up 17.7 post-op TPN Access: PICC placed 06/05/18 TPN start date: 06/06/18  Nutritional Goals (RD rec on 7/9): 1500-1800 kCal and 85-105gm protein per day  Current Nutrition:  TPN   Plan:  Increase TPN to goal rate of 70 ml/hr TPN will provide 96g AA, 286g CHO and 44g ILE for a total of  1774 kCal, meeting 100% of patient's needs. Electrolytes in TPN: increase Na slightly, reduce mag/K slightly, Cl:Ac 1:1 - fluctuation with increased TPN rate Daily multivitamin and trace elements in TPN Change SSI to moderate Q6H for now NS at 25 ml/hr >> d/c when TPN is at goal rate.  Keep NS at 10 ml/hr order. Glycophos 15 mmol IV x 1 F/U AM labs, possibility of starting EN bridge to clears   [name], PharmD, BCPS, [place] 06/08/2018, 10:42 AM

## 2018-06-08 NOTE — Clinical Social Work Note (Signed)
Clinical Social Work Assessment  Patient Details  Name: [name] MRN: [id number] Date of Birth: 1926/08/01  Date of referral:  06/08/18               Reason for consult:  Discharge Planning                Permission sought to share information with:  Family Supports Permission granted to share information::  Yes, Verbal Permission Granted  Name::     [name]  Agency::  [facility]   Relationship::  daughter  Contact Information:  [phone]  Housing/Transportation Living arrangements for the past 2 months:  [place] of Information:  Patient Patient Interpreter Needed:  None Criminal Activity/Legal Involvement Pertinent to Current Situation/Hospitalization:  No - Comment as needed Significant Relationships:  Adult Children Lives with:  Facility Resident Do you feel safe going back to the place where you live?  Yes Need for family participation in patient care:  Yes (Comment)  Care giving concerns:  No family at bedside. Patient stated she is from [place] and has been at the facility for months. Patient stated she has support from family in the area. Patient stated she feels safe to return back to rehab.  Social Worker assessment / plan:  Patient stated she is agreeable to return back to facility. CSW reached out to facility and spoke with admission coordinator Janie. Janie stated facility is ableto take patient back once medically stable  Employment status:  Retired Forensic scientist:  Medicare PT Recommendations:  [place] / Referral to community resources:  [place]  Patient/Family's Response to care:  Patent appreciate the care she has received during her stay   Patient/Family's Understanding of and Emotional Response to Diagnosis, Current Treatment, and Prognosis:  Patient agreeable with discharge plan  Emotional Assessment Appearance:  Appears stated  age Attitude/Demeanor/Rapport:  Other, Engaged Affect (typically observed):  Accepting Orientation:  Oriented to Self, Oriented to Place, Oriented to  Time, Oriented to Situation Alcohol / Substance use:  Not Applicable Psych involvement (Current and /or in the community):  No (Comment)  Discharge Needs  Concerns to be addressed:  Care Coordination Readmission within the last 30 days:  No Current discharge risk:  Dependent with Mobility Barriers to Discharge:  Continued Medical Work up   [employer], LCSW 06/08/2018, 3:11 PM

## 2018-06-08 NOTE — Progress Notes (Addendum)
[facility] Progress Note  1 Day Post-Op  Subjective: CC:  Alert and oriented. C/o 8/10 abd pain worse with coughing/movement. Denies nausea or emesis overnight. Denies flatus or BM. No family at bedside this AM.  Objective: Vital signs in last 24 hours: Temp:  [97.3 F (36.3 C)-98.6 F (37 C)] 98.2 F (36.8 C) (07/10 0400) Pulse Rate:  [84-92] 84 (07/09 1843) Resp:  [14-17] 16 (07/10 0400) BP: (124-145)/(67-96) 145/69 (07/10 0400) SpO2:  [97 %-100 %] 97 % (07/10 0400) Last BM Date: 06/07/18  Intake/Output from previous day: 07/09 0701 - 07/10 0700 In: 2445.6 [I.V.:2178.9; IV Piggyback:266.7] Out: 1220 [Urine:970; Emesis/NG output:200; Blood:50] Intake/Output this shift: Total I/O In: 105 [I.V.:75; Other:30] Out: 450 [Urine:350; Drains:100]  PE: Gen:  Alert, NAD, pleasant and cooperative  Card:  Regular rate and rhythm Pulm:  Normal effort, coarse upper airway sounds  Abd: Soft, appropriately tender, incisions clean and try with minimal erythema, gastrostomy tube to gravity -dressing clean and dry. Minimal bilious output in gravity bag. Skin: warm and dry, no rashes  Psych: A&Ox3   Lab Results:  Recent Labs    06/07/18 0446  06/07/18 1536 06/08/18 0337  WBC 10.9*  --   --  17.7*  HGB 9.7*   < > 8.8* 9.9*  HCT 30.1*   < > 26.0* 30.9*  PLT 298  --   --  317   < > = values in this interval not displayed.   BMET Recent Labs    06/07/18 0446  06/07/18 1536 06/08/18 0337  NA 139   < > 137 136  K 3.0*   < > 3.9 4.2  CL 104  --   --  102  CO2 25  --   --  24  GLUCOSE 127*   < > 134* 192*  BUN 16  --   --  15  CREATININE 0.60  --   --  0.63  CALCIUM 8.2*  --   --  8.2*   < > = values in this interval not displayed.   PT/INR No results for input(s): LABPROT, INR in the last 72 hours. CMP     Component Value Date/Time   NA 136 06/08/2018 0337   K 4.2 06/08/2018 0337   CL 102 06/08/2018 0337   CO2 24 06/08/2018 0337   GLUCOSE 192 (H)  06/08/2018 0337   BUN 15 06/08/2018 0337   CREATININE 0.63 06/08/2018 0337   CALCIUM 8.2 (L) 06/08/2018 0337   PROT 5.5 (L) 06/07/2018 0446   ALBUMIN 2.8 (L) 06/07/2018 0446   AST 14 (L) 06/07/2018 0446   ALT 12 06/07/2018 0446   ALKPHOS 60 06/07/2018 0446   BILITOT 0.7 06/07/2018 0446   GFRNONAA >60 06/08/2018 0337   GFRAA >60 06/08/2018 0337   Lipase     Component Value Date/Time   LIPASE 22 06/02/2018 2053       Studies/Results: No results found.  Anti-infectives: Anti-infectives (From admission, onward)   Start     Dose/Rate Route Frequency Ordered Stop   06/07/18 1415  cefoTEtan (CEFOTAN) 1 g in sodium chloride 0.9 % 100 mL IVPB  Status:  Discontinued     1 g 200 mL/hr over 30 Minutes Intravenous To ShortStay Surgical 06/07/18 1405 06/07/18 1409   06/07/18 1415  cefoTEtan in Dextrose 5% (CEFOTAN) IVPB 2 g  Status:  Discontinued     2 g 100 mL/hr over 30 Minutes Intravenous Every 12 hours 06/07/18 1408 06/07/18 1409  06/07/18 1415  cefoTEtan (CEFOTAN) 2 g in sodium chloride 0.9 % 100 mL IVPB  Status:  Discontinued     2 g 200 mL/hr over 30 Minutes Intravenous To ShortStay Surgical 06/07/18 1410 06/07/18 1851   06/07/18 1400  cefoTEtan in Dextrose 5% (CEFOTAN) IVPB 2 g  Status:  Discontinued     2 g 100 mL/hr over 30 Minutes Intravenous To ShortStay Surgical 06/07/18 1359 06/07/18 1409   06/07/18 0600  cefoTEtan in Dextrose 5% (CEFOTAN) IVPB 2 g  Status:  Discontinued     2 g 100 mL/hr over 30 Minutes Intravenous On call to O.R. 06/06/18 2009 06/07/18 1540   06/03/18 0900  ciprofloxacin (CIPRO) IVPB 400 mg  Status:  Discontinued     400 mg 200 mL/hr over 60 Minutes Intravenous Every 12 hours 06/03/18 0757 06/03/18 0759   06/03/18 0800  cefTRIAXone (ROCEPHIN) 1 g in sodium chloride 0.9 % 100 mL IVPB  Status:  Discontinued     1 g 200 mL/hr over 30 Minutes Intravenous Every 24 hours 06/03/18 0759 06/06/18 1135     Assessment/Plan Chest pain - resolved  UTI - per  primary team, treated with Rocephin  Urinary retention   HTN GERD Diastolic CHF  Nausea and vomiting Type III hiatal hernia  S/P LAPAROSCOPIC REPAIR OF HH, LAPAROSCOPIC 42F GASTROSTOMY - 06/07/2018 - D. [place]  -  POD#1, afebrile, VSS  -  Continue NPO, IVF  -  Clamp gastrostomy tube, may start clear liquids tomorrow if patient tolerates this   -  Minimize narcotics as able, I have ordered IV tylenol for today.  -  OOB/mobilize, PT/OT evals   FEN: NPO, IVF; clamp G tube  ID: Rocephin 7/5-7/8, perioperative cefotetan 7/9  VTE: SCD's, ok to start chemical DVT proph from surgical perspective.  Foley: in place x 4 days, ok to d/c and perform voiding trial from surgical perspective    LOS: 4 days    Jill Alexanders , [facility] Surgery 06/08/2018, 8:53 AM Pager: [phone] Consults: [phone]  Agree with above. No family in room.  Looks better than expected. She wants something to drink.  If she is doing well, she can start clear liquids tomorrow. Also agree with getting foley out as soon as possible.  Alphonsa Overall, MD, [facility] Surgery Pager: [phone] Office phone:  [phone]

## 2018-06-08 NOTE — Evaluation (Signed)
Physical Therapy Evaluation Patient Details Name: [name] MRN: [id number] DOB: Nov 27, 1926 Today's Date: 06/08/2018   History of Present Illness  31 YOF presented from SNF on 06/02/18 with intractable nausea, vomiting, and abdominal/chest pain.  Patient has a history of a large hiatal hernia but is not a good surgical candidate.  She started to have coffee-ground emesis 06/04/18 and EGD showed no evidence of ulcer or active bleeding. Pt underwent  LAPAROSCOPIC REPAIR OF HH, LAPAROSCOPIC 56F GASTROSTOMY   Clinical Impression  Pt admitted with above diagnosis. Pt currently with functional limitations due to the deficits listed below (see PT Problem List). Pt was able to transfer to chair with mod assist.  Pt was min assist transfers PTA. Will benefit from PT to return to prior functional level. Pt will benefit from skilled PT to increase their independence and safety with mobility to allow discharge to the venue listed below.         Follow Up Recommendations SNF;Supervision/Assistance - 24 hour    Equipment Recommendations  None recommended by PT    Recommendations for Other Services       Precautions / Restrictions Precautions Precautions: Fall Precaution Comments: gastrostomy tube Restrictions Weight Bearing Restrictions: No      Mobility  Bed Mobility Overal bed mobility: Needs Assistance Bed Mobility: Supine to Sit     Supine to sit: Mod assist     General bed mobility comments: assist for LEs and elevation of trunk  Transfers Overall transfer level: Needs assistance Equipment used: 1 person hand held assist Transfers: Sit to/from Stand;Stand Pivot Transfers Sit to Stand: Mod assist Stand pivot transfers: Mod assist       General transfer comment: Pt needed mod assist to take pivotal steps to chair.  Pt holding onto bil PT elbows and use of gait belt to stand and pivot. Keeps knees flexed.   Ambulation/Gait                Stairs             Wheelchair Mobility    Modified Rankin (Stroke Patients Only)       Balance Overall balance assessment: Needs assistance Sitting-balance support: Bilateral upper extremity supported;Feet supported Sitting balance-[name]: Poor Sitting balance - Comments: relies on UE support at times   Standing balance support: Bilateral upper extremity supported;During functional activity Standing balance-[name]: Poor Standing balance comment: relies on UE support bilaterally.                              Pertinent Vitals/Pain Pain Assessment: Faces Faces Pain Scale: Hurts even more Pain Location: abdomen Pain Descriptors / Indicators: Grimacing;Guarding Pain Intervention(s): Limited activity within patient's tolerance;Monitored during session;Repositioned    Home Living Family/patient expects to be discharged to:: Skilled nursing facility                 Additional Comments: Per daughter has been at [employer].  She used wheelchair and was min assist for transfers to wheelchair.  They got her up each day, she bathed and dressed with assist.      Prior Function Level of Independence: Needs assistance   Gait / Transfers Assistance Needed: min assist wheelchair and toilet transfers  ADL's / Homemaking Assistance Needed: A by staff at SNF  Comments: Daughter Tonia Ghent was present to give info     Hand Dominance   Dominant Hand: Right    Extremity/Trunk Assessment  Upper Extremity Assessment Upper Extremity Assessment: Defer to OT evaluation    Lower Extremity Assessment Lower Extremity Assessment: Generalized weakness    Cervical / Trunk Assessment Cervical / Trunk Assessment: Kyphotic  Communication   Communication: HOH  Cognition Arousal/Alertness: Awake/alert Behavior During Therapy: Flat affect Overall Cognitive Status: Within Functional Limits for tasks assessed                                        General Comments       Exercises General Exercises - Lower Extremity Ankle Circles/Pumps: AROM;Both;10 reps;Supine   Assessment/Plan    PT Assessment Patient needs continued PT services  PT Problem List Decreased activity tolerance;Decreased balance;Decreased mobility;Decreased knowledge of use of DME;Decreased safety awareness;Decreased knowledge of precautions;Pain       PT Treatment Interventions DME instruction;Gait training;Functional mobility training;Therapeutic activities;Therapeutic exercise;Balance training;Patient/family education    PT Goals (Current goals can be found in the Care Plan section)  Acute Rehab PT Goals Patient Stated Goal: to go home PT Goal Formulation: With patient Time For Goal Achievement: 06/22/18 Potential to Achieve Goals: Good    Frequency Min 2X/week   Barriers to discharge Decreased caregiver support      Co-evaluation               AM-PAC PT "6 Clicks" Daily Activity  Outcome Measure Difficulty turning over in bed (including adjusting bedclothes, sheets and blankets)?: Unable Difficulty moving from lying on back to sitting on the side of the bed? : Unable Difficulty sitting down on and standing up from a chair with arms (e.g., wheelchair, bedside commode, etc,.)?: Unable Help needed moving to and from a bed to chair (including a wheelchair)?: A Lot Help needed walking in hospital room?: Total Help needed climbing 3-5 steps with a railing? : Total 6 Click Score: 7    End of Session Equipment Utilized During Treatment: Gait belt;Oxygen Activity Tolerance: Patient limited by fatigue;Patient limited by pain Patient left: in chair;with call bell/phone within reach;with chair alarm set Nurse Communication: Mobility status;Need for lift equipment PT Visit Diagnosis: Unsteadiness on feet (R26.81);Muscle weakness (generalized) (M62.81);Pain Pain - part of body: (abdomen)    Time: 3614-4315 PT Time Calculation (min) (ACUTE ONLY): 23 min   Charges:   PT  Evaluation $PT Eval Moderate Complexity: 1 Mod PT Treatments $Therapeutic Activity: 8-22 mins   PT G Codes:        [place] Zhara Gieske,PT Acute Rehabilitation [phone] (pager)   Denice Paradise 06/08/2018, 1:12 PM

## 2018-06-09 LAB — GLUCOSE, CAPILLARY
GLUCOSE-CAPILLARY: 123 mg/dL — AB (ref 70–99)
Glucose-Capillary: 111 mg/dL — ABNORMAL HIGH (ref 70–99)
Glucose-Capillary: 115 mg/dL — ABNORMAL HIGH (ref 70–99)
Glucose-Capillary: 123 mg/dL — ABNORMAL HIGH (ref 70–99)
Glucose-Capillary: 125 mg/dL — ABNORMAL HIGH (ref 70–99)

## 2018-06-09 LAB — COMPREHENSIVE METABOLIC PANEL
ALBUMIN: 2.4 g/dL — AB (ref 3.5–5.0)
ALK PHOS: 57 U/L (ref 38–126)
ALT: 44 U/L (ref 0–44)
AST: 38 U/L (ref 15–41)
Anion gap: 7 (ref 5–15)
BILIRUBIN TOTAL: 0.3 mg/dL (ref 0.3–1.2)
BUN: 20 mg/dL (ref 8–23)
CALCIUM: 8.4 mg/dL — AB (ref 8.9–10.3)
CO2: 26 mmol/L (ref 22–32)
Chloride: 104 mmol/L (ref 98–111)
Creatinine, Ser: 0.49 mg/dL (ref 0.44–1.00)
GFR calc Af Amer: >60 mL/min (ref >60)
GFR calc non Af Amer: >60 mL/min (ref >60)
GLUCOSE: 120 mg/dL — AB (ref 70–99)
Potassium: 3.9 mmol/L (ref 3.5–5.1)
SODIUM: 137 mmol/L (ref 135–145)
TOTAL PROTEIN: 5.1 g/dL — AB (ref 6.5–8.1)

## 2018-06-09 LAB — PHOSPHORUS: Phosphorus: 2.9 mg/dL (ref 2.5–4.6)

## 2018-06-09 LAB — MAGNESIUM: Magnesium: 1.9 mg/dL (ref 1.7–2.4)

## 2018-06-09 MED ORDER — ACETAMINOPHEN 500 MG PO TABS
1000.0000 mg | ORAL_TABLET | Freq: Three times a day (TID) | ORAL | Status: DC
  Administered 2018-06-09 – 2018-06-24 (×40): 1000 mg via ORAL
  Filled 2018-06-09 (×41): qty 2

## 2018-06-09 MED ORDER — TRAVASOL 10 % IV SOLN
INTRAVENOUS | Status: AC
  Administered 2018-06-09: 18:00:00 via INTRAVENOUS
  Filled 2018-06-09: qty 957.6

## 2018-06-09 NOTE — Progress Notes (Addendum)
[facility] Progress Note  2 Days Post-Op  Subjective: CC:  [name] reports feeling well this morning.  Does complain of some abdominal soreness.  Denies nausea or vomiting with G-tube clamped.  Denies flatus or bowel movement.  Got up to the chair yesterday with physical therapy. No family is at bedside currently.  Objective: Vital signs in last 24 hours: Temp:  [98 F (36.7 C)-98.5 F (36.9 C)] 98 F (36.7 C) (07/11 0400) Pulse Rate:  [80-82] 80 (07/11 0400) Resp:  [14-15] 14 (07/11 0400) BP: (121-146)/(61-77) 130/61 (07/11 0400) SpO2:  [98 %-100 %] 98 % (07/11 0400) Last BM Date: 06/07/18  Intake/Output from previous day: 07/10 0701 - 07/11 0700 In: 1475 [I.V.:1445] Out: 1100 [Urine:1000; Drains:100] Intake/Output this shift: No intake/output data recorded.  PE: Gen:  Alert, NAD, pleasant and cooperative  Pulm:  Normal effort Abd: Soft, appropriately tender, mild distention, bowel sounds present, incisions clean and dry, G-tube clamped and surrounding dressing is clean and dry without signs of leaking. Skin: warm and dry, no rashes  Psych: A&Ox3   Lab Results:  Recent Labs    06/07/18 0446  06/07/18 1536 06/08/18 0337  WBC 10.9*  --   --  17.7*  HGB 9.7*   < > 8.8* 9.9*  HCT 30.1*   < > 26.0* 30.9*  PLT 298  --   --  317   < > = values in this interval not displayed.   BMET Recent Labs    06/07/18 0446  06/07/18 1536 06/08/18 0337  NA 139   < > 137 136  K 3.0*   < > 3.9 4.2  CL 104  --   --  102  CO2 25  --   --  24  GLUCOSE 127*   < > 134* 192*  BUN 16  --   --  15  CREATININE 0.60  --   --  0.63  CALCIUM 8.2*  --   --  8.2*   < > = values in this interval not displayed.   PT/INR No results for input(s): LABPROT, INR in the last 72 hours. CMP     Component Value Date/Time   NA 136 06/08/2018 0337   K 4.2 06/08/2018 0337   CL 102 06/08/2018 0337   CO2 24 06/08/2018 0337   GLUCOSE 192 (H) 06/08/2018 0337   BUN 15 06/08/2018  0337   CREATININE 0.63 06/08/2018 0337   CALCIUM 8.2 (L) 06/08/2018 0337   PROT 5.5 (L) 06/07/2018 0446   ALBUMIN 2.8 (L) 06/07/2018 0446   AST 14 (L) 06/07/2018 0446   ALT 12 06/07/2018 0446   ALKPHOS 60 06/07/2018 0446   BILITOT 0.7 06/07/2018 0446   GFRNONAA >60 06/08/2018 0337   GFRAA >60 06/08/2018 0337   Lipase     Component Value Date/Time   LIPASE 22 06/02/2018 2053       Studies/Results: No results found.  Anti-infectives: Anti-infectives (From admission, onward)   Start     Dose/Rate Route Frequency Ordered Stop   06/07/18 1415  cefoTEtan (CEFOTAN) 1 g in sodium chloride 0.9 % 100 mL IVPB  Status:  Discontinued     1 g 200 mL/hr over 30 Minutes Intravenous To ShortStay Surgical 06/07/18 1405 06/07/18 1409   06/07/18 1415  cefoTEtan in Dextrose 5% (CEFOTAN) IVPB 2 g  Status:  Discontinued     2 g 100 mL/hr over 30 Minutes Intravenous Every 12 hours 06/07/18 1408 06/07/18 1409   06/07/18  1415  cefoTEtan (CEFOTAN) 2 g in sodium chloride 0.9 % 100 mL IVPB  Status:  Discontinued     2 g 200 mL/hr over 30 Minutes Intravenous To ShortStay Surgical 06/07/18 1410 06/07/18 1851   06/07/18 1400  cefoTEtan in Dextrose 5% (CEFOTAN) IVPB 2 g  Status:  Discontinued     2 g 100 mL/hr over 30 Minutes Intravenous To ShortStay Surgical 06/07/18 1359 06/07/18 1409   06/07/18 0600  cefoTEtan in Dextrose 5% (CEFOTAN) IVPB 2 g  Status:  Discontinued     2 g 100 mL/hr over 30 Minutes Intravenous On call to O.R. 06/06/18 2009 06/07/18 1540   06/03/18 0900  ciprofloxacin (CIPRO) IVPB 400 mg  Status:  Discontinued     400 mg 200 mL/hr over 60 Minutes Intravenous Every 12 hours 06/03/18 0757 06/03/18 0759   06/03/18 0800  cefTRIAXone (ROCEPHIN) 1 g in sodium chloride 0.9 % 100 mL IVPB  Status:  Discontinued     1 g 200 mL/hr over 30 Minutes Intravenous Every 24 hours 06/03/18 0759 06/06/18 1135     Assessment/Plan Chest pain - resolved  UTI - per primary team, treated with Rocephin   Urinary retention   HTN GERD Diastolic CHF  Nausea and vomiting Type III hiatal hernia  S/P LAPAROSCOPIC REPAIR OF HH, LAPAROSCOPIC 21F GASTROSTOMY - 06/07/2018 - D. Johnnell Liou             -  POD#2, afebrile, VSS             -  Continue NPO, IVF             -  Keep gastrostomy tube clamped, start sips from the floor and await further bowel function             -  Minimize narcotics as able, scheduled p.o. Tylenol             -  OOB/mobilize, continue PT/OT - currently recommending SNF  -  Incentive spirometry q 1h!!    FEN:  G tube clamped; start sips of clear liquids from the floor ID: Rocephin 7/5-7/8, perioperative cefotetan 7/9  VTE: SCD's, subcutaneous heparin Foley: in place x 5 days, recommend discontinuation of Foley catheter and reattempting voiding trial today    LOS: 5 days   Jill Alexanders , [facility] Surgery 06/09/2018, 8:37 AM Pager: [phone] Consults: [phone]  Agree with above. Up in chair. Agree with getting the foley out Looks good. No family in room.  Alphonsa Overall, MD, [facility] Surgery Pager: [phone] Office phone:  [phone]

## 2018-06-09 NOTE — Progress Notes (Signed)
Occupational Therapy Treatment Patient Details Name: [name] MRN: [id number] DOB: 1926-07-10 Today's Date: 06/09/2018    History of present illness 74 YOF presented from SNF on 06/02/18 with intractable nausea, vomiting, and abdominal/chest pain.  Patient has a history of a large hiatal hernia but is not a good surgical candidate.  She started to have coffee-ground emesis 06/04/18 and EGD showed no evidence of ulcer or active bleeding. Pt underwent  LAPAROSCOPIC REPAIR OF HH, LAPAROSCOPIC 54F GASTROSTOMY    OT comments  Pt is dependent in bathing, dressing and toileting at her SNF and is able to self feed and participate in grooming. She is assisted for pivot to w/c and does some self propelling. Pt is functioning near her baseline. Recommend up to chair daily with nursing. No acute OT needs.  Follow Up Recommendations  SNF;Supervision/Assistance - 24 hour    Equipment Recommendations       Recommendations for Other Services      Precautions / Restrictions Precautions Precautions: Fall Precaution Comments: gastrostomy tube Restrictions Weight Bearing Restrictions: No       Mobility Bed Mobility Overal bed mobility: Needs Assistance Bed Mobility: Rolling;Sidelying to Sit Rolling: Max assist Sidelying to sit: Mod assist       General bed mobility comments: used log roll for comfort  Transfers Overall transfer level: Needs assistance Equipment used: Rolling walker (2 wheeled) Transfers: Sit to/from [facility] Sit to Stand: Mod assist Stand pivot transfers: Mod assist       General transfer comment: assist to rise and for posterior Cox, increased time to take pivotal steps to chair    Balance Overall balance assessment: Needs assistance   Sitting balance-[name]: Poor Sitting balance - Comments: relies on UE support at times   Standing balance support: Bilateral upper extremity supported;During functional activity Standing balance-Leahy  Scale: Poor Standing balance comment: relies on UE support bilaterally.                            ADL either performed or assessed with clinical judgement   ADL Overall ADL's : At baseline                                             Vision Baseline Vision/History: Macular Degeneration Patient Visual Report: No change from baseline     Perception     Praxis      Cognition Arousal/Alertness: Awake/alert Behavior During Therapy: Flat affect Overall Cognitive Status: Within Functional Limits for tasks assessed                                          Exercises     Shoulder Instructions       General Comments      Pertinent Vitals/ Pain       Pain Assessment: Faces Faces Pain Scale: Hurts little more Pain Location: abdomen Pain Descriptors / Indicators: Grimacing;Guarding Pain Intervention(s): Monitored during session;Repositioned  Home Living Family/patient expects to be discharged to:: Skilled nursing facility                                 Additional Comments: Per daughter has been at  [facility].  She used wheelchair and was min assist for transfers to wheelchair.  They got her up each day, she bathed and dressed with assist.        Prior Functioning/Environment Level of Independence: Needs assistance  Gait / Transfers Assistance Needed: min assist wheelchair and toilet transfers ADL's / Homemaking Assistance Needed: A by staff at SNF       Frequency           Progress Toward Goals  OT Goals(current goals can now be found in the care plan section)     Acute Rehab OT Goals Patient Stated Goal: to go home  Plan      Co-evaluation                 AM-PAC PT "6 Clicks" Daily Activity     Outcome Measure   Help from another person eating meals?: A Little Help from another person taking care of personal grooming?: A Little Help from another person toileting, which includes using  toliet, bedpan, or urinal?: Total Help from another person bathing (including washing, rinsing, drying)?: A Lot Help from another person to put on and taking off regular upper body clothing?: A Lot Help from another person to put on and taking off regular lower body clothing?: Total 6 Click Score: 12    End of Session Equipment Utilized During Treatment: Gait belt;Rolling walker  OT Visit Diagnosis: Unsteadiness on feet (R26.81);Pain   Activity Tolerance Patient tolerated treatment well   Patient Left in chair;with call bell/phone within reach   Nurse Communication Mobility status(ok to have apple juice)        Time: 5789-7847 OT Time Calculation (min): 31 min  Charges: OT General Charges $OT Visit: 1 Visit OT Evaluation $OT Eval Moderate Complexity: 1 Mod OT Treatments $Therapeutic Activity: 8-22 mins  06/09/2018 [name], OTR/L Pager: [phone]   [name] [name] 06/09/2018, 4:09 PM

## 2018-06-09 NOTE — Progress Notes (Addendum)
PROGRESS NOTE  [name] KVQ:[id number] DOB: 04/26/1926 DOA: 06/02/2018 PCP: Deland Pretty, MD  Brief Narrative: 84 year old woman presented with nausea, vomiting, abdominal pain, chest pain secondary to large hiatal hernia.  Troponin was minimally elevated, patient was seen by cardiology, felt to have demand ischemia secondary to large hiatal hernia, pain.  Seen by gastroenterology, underwent EGD.  Assessment/Plan Type III hiatal hernia. S/p EGD 7/7 with findings as below.  Status post hernia repair and PEG tube placement 7/9.   --Management per surgery.  Continues on TPN.  Tolerating some liquids as per surgery.  Urinary retention, Foley catheter placed 7/5.  Urinary tract infection on admission with sepsis.  Treated with ceftriaxone. --Remove Foley today  Chest pain, demand ischemia echo showed normal LVEF, no wall motion abnormalities.  Cardiology signed off.  No further recommendations.  Chronic diastolic congestive heart failure. Does not require diuretic. --Appears euvolemic   Continue management per surgery  DVT prophylaxis: heparin Code Status: DNR Family Communication: Daughter at bedside Disposition Plan: SNF rec by PT    [name], MD  [facility] Direct contact: [phone] --Via amion app OR  --[email or web address]; password [email or web address]  7PM-7AM contact night coverage as above 06/09/2018, 11:56 AM  LOS: 5 days   Consultants:  General surgery  GI  Cardiology  Procedures:  EGD Impression:               - Large type-III paraesophageal hernia. possible                            partial torsion cannot be ruled out                           - Normal mucosa was found in the entire stomach. no                            evidence of active bleeding or ulcer disease.                           - Normal duodenal bulb, first portion of the                            duodenum and second portion of the duodenum.                           - No specimens  collected.  PICC 7/7  LAPAROSCOPIC REPAIR OF HIATAL HERNIA,  LAPAROSCOPIC #20 GASTROSTOMY  Echo Study Conclusions  - Left ventricle: The cavity size was normal. Wall thickness was   normal. Systolic function was normal. The estimated ejection   fraction was in the range of 55% to 60%. Wall motion was normal;   there were no regional wall motion abnormalities. Doppler   parameters are consistent with abnormal left ventricular   relaxation (grade 1 diastolic dysfunction). - Mitral valve: There was mild regurgitation. - Left atrium: The atrium was severely dilated. - Pulmonary arteries: Systolic pressure was mildly increased. PA   peak pressure: 44 mm Hg (S).  Impressions:  - Normal LV systolic function; mild diastolic dysfunction; mild MR;   severe LAE; mild TR with mild pulmonary hypertension.  Antimicrobials:  Ceftriaxone 7/5 >> 7/9  Cefotetan 7/10  Interval history/Subjective: A little stronger  today.  Tolerating liquids.  No nausea or vomiting.  Objective: Vitals:  Vitals:   06/09/18 0000 06/09/18 0400  BP: 121/63 130/61  Pulse:  80  Resp: 14 14  Temp: 98.5 F (36.9 C) 98 F (36.7 C)  SpO2: 98% 98%    Exam: Constitutional:   . Appears calm and comfortable.  Appears better today. Respiratory:  . CTA bilaterally, no w/r/r.  . Respiratory effort normal.  Cardiovascular:  . RRR, no m/r/g . No LE extremity edema   Psychiatric:  . Mental status o Mood, affect appropriate  I have personally reviewed the following:   Labs:  Complete metabolic panel, magnesium, phosphorus unremarkable.  Scheduled Meds: . acetaminophen  1,000 mg Oral Q8H  . chlorhexidine  15 mL Mouth Rinse BID  . heparin injection (subcutaneous)  5,000 Units Subcutaneous Q8H  . insulin aspart  0-15 Units Subcutaneous Q6H  . mouth rinse  15 mL Mouth Rinse q12n4p  . metoCLOPramide (REGLAN) injection  5 mg Intravenous Q8H  . pantoprazole  40 mg Intravenous Q12H  . sodium chloride  flush  10-40 mL Intracatheter Q12H   Continuous Infusions: . sodium chloride 0 mL/hr at 06/06/18 0838  . lactated ringers 10 mL/hr at 06/07/18 1410  . TPN ADULT (ION) 70 mL/hr at 06/09/18 0600  . TPN ADULT (ION)      Principal Problem:   Hiatal hernia Active Problems:   Essential hypertension, benign   GERD (gastroesophageal reflux disease)   Chronic diastolic CHF (congestive heart failure) ([place])   Acute lower UTI   Acute urinary retention   LOS: 5 days

## 2018-06-09 NOTE — Progress Notes (Signed)
PHARMACY - ADULT TOTAL PARENTERAL NUTRITION CONSULT NOTE   Pharmacy Consult:  TPN Indication:  Obstruction from hiatal hernia  Patient Measurements: Height: 5\' 1"  (154.9 cm) Weight: 152 lb 5.4 oz (69.1 kg) IBW/kg (Calculated) : 47.8 TPN AdjBW (KG): 53.1 Body mass index is 28.78 kg/m. Usual Weight: 68-70 kg  Assessment:  [name] presented from SNF on 06/02/18 with intractable nausea, vomiting, and abdominal/chest pain.  Patient has a history of a large hiatal hernia but is not a good surgical candidate.  She started to have coffee-ground emesis 06/04/18 and EGD showed no evidence of ulcer or active bleeding.    Patient denies recent weight loss and is at her usual weight.  She was eating 1/3 - 1/2 of her meal trays PTA.  She states that feeding tube is uncomfortable but she will give it a try if needed to maintain her nutrition.  Spoke to [facility], placing a post-pyloric feeding tube is not feasible d/t anatomy/hernia.  Even with surgery, patient will be not be able to have adequate nutrition anytime soon.  Pharmacy consulted to manage TPN.  GI: hx GERD/hiatal hernia.  S/p hernia repair with Gtube placement 7/9.  BL prealbumin 14.5, LBM 7/8, drain O/P 178mL - Reglan, PPI IV, PRN Zofran Endo: no hx DM - CBGs controlled Insulin requirements in the past 24 hours: 6 units mSSI Lytes: all WNL  Renal: SCr 0.49 stable, BUN WNL - UOP 0.6 ml/kg/hr, NS at 25/hr Pulm: [place] >> RA Cards: HTN/HLD/CHF - VSS - PRN hydralazine Hepatobil: LFTs / tbili / TG WNL Neuro: pain score 0-2, on scheduled APAP, PRN Fentanyl ID: s/p 4d CTX for E.coli UTI, CoNS in BCx (?contaminant) - afebrile, WBC up 17.7 post-op TPN Access: PICC placed 06/05/18 TPN start date: 06/06/18  Nutritional Goals (RD rec on 7/9): 1500-1800 kCal and 85-105gm protein per day  Current Nutrition:  TPN   Plan:  Continue TPN at goal rate of 70 ml/hr, providing 96g AA, 286g CHO and 44g ILE for a total of 1774 kCal, meeting 100% of patient's  needs. Electrolytes in TPN: increase Phos slightly, Cl:Ac 1:1 Daily multivitamin and trace elements in TPN Change back to sensitive SSI Q6H F/U with possibility of starting EN bridge to oral diet Repeat labs on Sat    [name], PharmD, BCPS, [place] 06/09/2018, 9:47 AM

## 2018-06-10 LAB — BASIC METABOLIC PANEL
Anion gap: 9 (ref 5–15)
BUN: 25 mg/dL — AB (ref 8–23)
CO2: 23 mmol/L (ref 22–32)
CREATININE: 0.52 mg/dL (ref 0.44–1.00)
Calcium: 8.4 mg/dL — ABNORMAL LOW (ref 8.9–10.3)
Chloride: 99 mmol/L (ref 98–111)
GFR calc Af Amer: >60 mL/min (ref >60)
GLUCOSE: 116 mg/dL — AB (ref 70–99)
Potassium: 3.9 mmol/L (ref 3.5–5.1)
Sodium: 131 mmol/L — ABNORMAL LOW (ref 135–145)

## 2018-06-10 LAB — GLUCOSE, CAPILLARY
Glucose-Capillary: 111 mg/dL — ABNORMAL HIGH (ref 70–99)
Glucose-Capillary: 117 mg/dL — ABNORMAL HIGH (ref 70–99)
Glucose-Capillary: 107 mg/dL — ABNORMAL HIGH (ref 70–99)
Glucose-Capillary: 113 mg/dL — ABNORMAL HIGH (ref 70–99)

## 2018-06-10 MED ORDER — TRAVASOL 10 % IV SOLN
INTRAVENOUS | Status: AC
  Administered 2018-06-10: 17:00:00 via INTRAVENOUS
  Filled 2018-06-10: qty 957.6

## 2018-06-10 MED ORDER — SODIUM CHLORIDE 0.9 % IV BOLUS
500.0000 mL | Freq: Once | INTRAVENOUS | Status: AC
  Administered 2018-06-10: 500 mL via INTRAVENOUS

## 2018-06-10 NOTE — Progress Notes (Signed)
PHARMACY - ADULT TOTAL PARENTERAL NUTRITION CONSULT NOTE   Pharmacy Consult:  TPN Indication:  Obstruction from hiatal hernia  Patient Measurements: Height: 5\' 1"  (154.9 cm) Weight: 167 lb 5.3 oz (75.9 kg) IBW/kg (Calculated) : 47.8 TPN AdjBW (KG): 53.1 Body mass index is 31.62 kg/m. Usual Weight: 68-70 kg  Assessment:  [name] presented from SNF on 06/02/18 with intractable nausea, vomiting, and abdominal/chest pain.  Patient has a history of a large hiatal hernia but is not a good surgical candidate.  She started to have coffee-ground emesis 06/04/18 and EGD showed no evidence of ulcer or active bleeding.    Patient denies recent weight loss and is at her usual weight.  She was eating 1/3 - 1/2 of her meal trays PTA.  She states that feeding tube is uncomfortable but she will give it a try if needed to maintain her nutrition.  Spoke to [facility], placing a post-pyloric feeding tube is not feasible d/t anatomy/hernia.  Even with surgery, patient will not be able to have adequate nutrition anytime soon.  Pharmacy consulted to manage TPN.  GI: hx GERD/hiatal hernia.  S/p hernia repair with Gtube placement 7/9.  BL prealbumin 14.5, LBM 7/8, drain O/P 162mL - Reglan, PPI IV, PRN Zofran Endo: no hx DM - CBGs controlled Insulin requirements in the past 24 hours: 4 units mSSI Lytes: all WNL (7/11) Renal: SCr 0.49 stable, BUN WNL - UOP - not recorded this AM, NS at 10/hr, LR at 10/hr Pulm: [place] >> RA Cards: HTN/HLD/CHF - VSS - PRN hydralazine Hepatobil: LFTs / tbili / TG WNL Neuro: pain score 0-2, on scheduled APAP, PRN Fentanyl ID: s/p 4d CTX for E.coli UTI, CoNS in BCx (?contaminant) - afebrile, WBC up 17.7 post-op TPN Access: PICC placed 06/05/18 TPN start date: 06/06/18  Nutritional Goals (RD rec on 7/9): 1500-1800 kCal and 85-105gm protein per day  Current Nutrition:  TPN  Plan:  Continue TPN at goal rate of 70 ml/hr, providing 96g AA, 286g CHO and 44g ILE for a total of 1774 kCal, meeting 100%  of patient's needs. Electrolytes in TPN: increase Phos slightly, Cl:Ac 1:1 Daily multivitamin and trace elements in TPN Continue sensitive SSI Q6H F/U with possibility of starting EN bridge to oral diet Repeat labs on Sat    Rober Minion, PharmD., MS Clinical Pharmacist Pager:  [phone] Thank you for allowing pharmacy to be part of this patients care team. 06/10/2018, 7:50 AM

## 2018-06-10 NOTE — Care Management Important Message (Signed)
Important Message  Patient Details  Name: [name] MRN: [id number] Date of Birth: 23-Nov-1926   Medicare Important Message Given:  Yes    Kham Zuckerman P [place] 06/10/2018, 3:22 PM

## 2018-06-10 NOTE — Progress Notes (Signed)
Physical Therapy Treatment Patient Details Name: [name] MRN: [id number] DOB: 1926/11/25 Today's Date: 06/10/2018    History of Present Illness 21 YOF presented from SNF on 06/02/18 with intractable nausea, vomiting, and abdominal/chest pain.  Patient has a history of a large hiatal hernia but is not a good surgical candidate.  She started to have coffee-ground emesis 06/04/18 and EGD showed no evidence of ulcer or active bleeding. Pt underwent  LAPAROSCOPIC REPAIR OF HH, LAPAROSCOPIC 59F GASTROSTOMY     PT Comments    Pt admitted with above diagnosis. Pt currently with functional limitations due to balance and endurance deficits. Pt was able to stand pivot with RW with min assist today doing better than previous visit.  Will continue to follow and progress as able. Pt will benefit from skilled PT to increase their independence and safety with mobility to allow discharge to the venue listed below.     Follow Up Recommendations  SNF;Supervision/Assistance - 24 hour     Equipment Recommendations  None recommended by PT    Recommendations for Other Services       Precautions / Restrictions Precautions Precautions: Fall Restrictions Weight Bearing Restrictions: No    Mobility  Bed Mobility Overal bed mobility: Needs Assistance Bed Mobility: Rolling;Sidelying to Sit Rolling: Mod assist Sidelying to sit: Mod assist Supine to sit: Mod assist     General bed mobility comments: used log roll for comfort.  Pt needed mod assist for LEs to EOB and elevation of trunk  Transfers Overall transfer level: Needs assistance Equipment used: Rolling walker (2 wheeled) Transfers: Sit to/from [facility] Sit to Stand: Mod assist Stand pivot transfers: Min assist       General transfer comment: assist to rise and for posterior lean, increased time to take pivotal steps to chair however pt did better with use of RW and was less assist today for the pivot  transfer  Ambulation/Gait                 Stairs             Wheelchair Mobility    Modified Rankin (Stroke Patients Only)       Balance Overall balance assessment: Needs assistance Sitting-balance support: Bilateral upper extremity supported;Feet supported Sitting balance-[name]: Poor Sitting balance - Comments: relies on UE support at times   Standing balance support: Bilateral upper extremity supported;During functional activity Standing balance-[name]: Poor Standing balance comment: relies on UE support bilaterally. Heavy posterior lean at times. Does not stand fully upright and daughter states pt with scoliosis and she can't stand tall.                             Cognition Arousal/Alertness: Awake/alert Behavior During Therapy: Flat affect Overall Cognitive Status: Within Functional Limits for tasks assessed                                        Exercises General Exercises - Lower Extremity Ankle Circles/Pumps: AROM;Both;10 reps;Supine Long Arc Quad: AROM;Both;10 reps;Seated Hip Flexion/Marching: AROM;Both;10 reps;Seated    General Comments        Pertinent Vitals/Pain Pain Assessment: Faces Faces Pain Scale: Hurts little more Pain Location: abdomen Pain Descriptors / Indicators: Grimacing;Guarding Pain Intervention(s): Limited activity within patient's tolerance;Monitored during session;Repositioned    Home Living  Prior Function            PT Goals (current goals can now be found in the care plan section) Acute Rehab PT Goals Patient Stated Goal: to go home Progress towards PT goals: Progressing toward goals    Frequency    Min 2X/week      PT Plan Current plan remains appropriate    Co-evaluation              AM-PAC PT "6 Clicks" Daily Activity  Outcome Measure  Difficulty turning over in bed (including adjusting bedclothes, sheets and blankets)?:  Unable Difficulty moving from lying on back to sitting on the side of the bed? : Unable Difficulty sitting down on and standing up from a chair with arms (e.g., wheelchair, bedside commode, etc,.)?: Unable Help needed moving to and from a bed to chair (including a wheelchair)?: A Lot Help needed walking in hospital room?: Total Help needed climbing 3-5 steps with a railing? : Total 6 Click Score: 7    End of Session Equipment Utilized During Treatment: Gait belt Activity Tolerance: Patient limited by fatigue Patient left: in chair;with call bell/phone within reach;with chair alarm set;with family/visitor present Nurse Communication: Mobility status;Need for lift equipment PT Visit Diagnosis: Unsteadiness on feet (R26.81);Muscle weakness (generalized) (M62.81);Pain Pain - part of body: (abdomen)     Time: 9024-0973 PT Time Calculation (min) (ACUTE ONLY): 14 min  Charges:  $Therapeutic Activity: 8-22 mins                    G Codes:       [place] [phone] (pager)    Denice Paradise 06/10/2018, 1:42 PM

## 2018-06-10 NOTE — Progress Notes (Addendum)
[facility] Progress Note  3 Days Post-Op  Subjective: CC:  Daughter and physical therapy are in the room.  Patient looks well and states she is feeling better. Tolerating liquids from the floor without nausea or emesis. +flatus. Denies leakage around G-tube.   Objective: Vital signs in last 24 hours: Temp:  [98 F (36.7 C)-98.7 F (37.1 C)] 98 F (36.7 C) (07/12 0809) Pulse Rate:  [80-97] 80 (07/12 0809) Resp:  [16-18] 18 (07/12 0345) BP: (112-132)/(56-71) 126/64 (07/12 0809) SpO2:  [95 %-99 %] 97 % (07/12 0809) Weight:  [75.7 kg (166 lb 14.2 oz)-75.9 kg (167 lb 5.3 oz)] 75.9 kg (167 lb 5.3 oz) (07/12 0345) Last BM Date: 06/07/18  Intake/Output from previous day: 07/11 0701 - 07/12 0700 In: 600 [I.V.:10; IV Piggyback:500] Out: -  Intake/Output this shift: Total I/O In: -  Out: 250 [Urine:250]  PE: Gen:  Alert, NAD, pleasant and cooperative  Pulm:  Normal effort Abd: Soft, appropriately tender, mild distention, bowel sounds present, incisions clean and dry, G-tube clamped and surrounding dressing is clean and dry without signs of leaking; reducible umbilical hernia Skin: warm and dry, no rashes  Psych: A&Ox3    Lab Results:  Recent Labs    06/07/18 1536 06/08/18 0337  WBC  --  17.7*  HGB 8.8* 9.9*  HCT 26.0* 30.9*  PLT  --  317   BMET Recent Labs    06/09/18 0833 06/10/18 1006  NA 137 131*  K 3.9 3.9  CL 104 99  CO2 26 23  GLUCOSE 120* 116*  BUN 20 25*  CREATININE 0.49 0.52  CALCIUM 8.4* 8.4*   PT/INR No results for input(s): LABPROT, INR in the last 72 hours. CMP     Component Value Date/Time   NA 131 (L) 06/10/2018 1006   K 3.9 06/10/2018 1006   CL 99 06/10/2018 1006   CO2 23 06/10/2018 1006   GLUCOSE 116 (H) 06/10/2018 1006   BUN 25 (H) 06/10/2018 1006   CREATININE 0.52 06/10/2018 1006   CALCIUM 8.4 (L) 06/10/2018 1006   PROT 5.1 (L) 06/09/2018 0833   ALBUMIN 2.4 (L) 06/09/2018 0833   AST 38 06/09/2018 0833   ALT 44  06/09/2018 0833   ALKPHOS 57 06/09/2018 0833   BILITOT 0.3 06/09/2018 0833   GFRNONAA >60 06/10/2018 1006   GFRAA >60 06/10/2018 1006   Lipase     Component Value Date/Time   LIPASE 22 06/02/2018 2053       Studies/Results: No results found.  Anti-infectives: Anti-infectives (From admission, onward)   Start     Dose/Rate Route Frequency Ordered Stop   06/07/18 1415  cefoTEtan (CEFOTAN) 1 g in sodium chloride 0.9 % 100 mL IVPB  Status:  Discontinued     1 g 200 mL/hr over 30 Minutes Intravenous To [facility] 06/07/18 1405 06/07/18 1409   06/07/18 1415  cefoTEtan in Dextrose 5% (CEFOTAN) IVPB 2 g  Status:  Discontinued     2 g 100 mL/hr over 30 Minutes Intravenous Every 12 hours 06/07/18 1408 06/07/18 1409   06/07/18 1415  cefoTEtan (CEFOTAN) 2 g in sodium chloride 0.9 % 100 mL IVPB  Status:  Discontinued     2 g 200 mL/hr over 30 Minutes Intravenous To ShortStay Surgical 06/07/18 1410 06/07/18 1851   06/07/18 1400  cefoTEtan in Dextrose 5% (CEFOTAN) IVPB 2 g  Status:  Discontinued     2 g 100 mL/hr over 30 Minutes Intravenous To ShortStay Surgical 06/07/18 1359  06/07/18 1409   06/07/18 0600  cefoTEtan in Dextrose 5% (CEFOTAN) IVPB 2 g  Status:  Discontinued     2 g 100 mL/hr over 30 Minutes Intravenous On call to O.R. 06/06/18 2009 06/07/18 1540   06/03/18 0900  ciprofloxacin (CIPRO) IVPB 400 mg  Status:  Discontinued     400 mg 200 mL/hr over 60 Minutes Intravenous Every 12 hours 06/03/18 0757 06/03/18 0759   06/03/18 0800  cefTRIAXone (ROCEPHIN) 1 g in sodium chloride 0.9 % 100 mL IVPB  Status:  Discontinued     1 g 200 mL/hr over 30 Minutes Intravenous Every 24 hours 06/03/18 0759 06/06/18 1135     Assessment/Plan Chest pain - resolved  UTI - per primary team, treated with Rocephin  Urinary retention  HTN GERD Diastolic CHF  Nausea and vomiting Type III hiatal hernia S/P LAPAROSCOPIC REPAIR OF HH, LAPAROSCOPIC 30F GASTROSTOMY- 06/07/2018 - D.  Phelan Goers - POD#3, afebrile, VSS - Keep gastrostomy tube clamped, start clear liquid tray + boost breeze  - Minimize narcotics as able, scheduled p.o. Tylenol - OOB/mobilize, continue PT/OT - currently recommending SNF             -  Incentive spirometry q 1h!!    FEN:  G tube clamped; clear liquid diet + protein shakes  ID: Rocephin 7/5-7/8, perioperative cefotetan 7/9  VTE: SCD's, subcutaneous heparin Foley: in place x 5 days, removed 7/11.     LOS: 6 days    Jill Alexanders , [facility] 06/10/2018, 11:27 AM Pager: [phone] Consults: [phone]  Agree with above. Continues to do well. Okay to slowly advance diet.  Alphonsa Overall, MD, [facility] Pager: [phone] Office phone:  [phone]

## 2018-06-10 NOTE — Progress Notes (Signed)
PROGRESS NOTE  [name] EGB:[id number] DOB: 06-Nov-1926 DOA: 06/02/2018 PCP: Deland Pretty, MD  Brief Narrative: 84 year old woman presented with nausea, vomiting, abdominal pain, chest pain secondary to large hiatal hernia.  Troponin was minimally elevated, patient was seen by cardiology, felt to have demand ischemia secondary to large hiatal hernia, pain.  Seen by gastroenterology, underwent EGD.  Assessment/Plan Type III hiatal hernia. S/p EGD 7/7 with findings as below.  Status post hernia repair and PEG tube placement 7/9.   --Continue management per surgery.  Tolerating liquids.  TPN per pharmacy.  Urinary retention, Foley catheter placed 7/5.  Urinary tract infection on admission with sepsis.  Treated with ceftriaxone. --Foley removed 7/11.  Monitor clinically.  Chest pain, demand ischemia echo showed normal LVEF, no wall motion abnormalities.  Cardiology signed off.  No further recommendations.  Chronic diastolic congestive heart failure. Does not require diuretic. --Appears stable.   Continue management per surgery  DVT prophylaxis: heparin Code Status: DNR Family Communication:  Disposition Plan: SNF rec by PT    [name], MD  [facility] Direct contact: [phone] --Via amion app OR  --[email or web address]; password [email or web address]  7PM-7AM contact night coverage as above 06/10/2018, 4:14 PM  LOS: 6 days   Consultants:  General surgery  GI  Cardiology  Procedures:  EGD Impression:               - Large type-III paraesophageal hernia. possible                            partial torsion cannot be ruled out                           - Normal mucosa was found in the entire stomach. no                            evidence of active bleeding or ulcer disease.                           - Normal duodenal bulb, first portion of the                            duodenum and second portion of the duodenum.                           - No specimens collected.  PICC  7/7  LAPAROSCOPIC REPAIR OF HIATAL HERNIA,  LAPAROSCOPIC #20 GASTROSTOMY  Echo Study Conclusions  - Left ventricle: The cavity size was normal. Wall thickness was   normal. Systolic function was normal. The estimated ejection   fraction was in the range of 55% to 60%. Wall motion was normal;   there were no regional wall motion abnormalities. Doppler   parameters are consistent with abnormal left ventricular   relaxation (grade 1 diastolic dysfunction). - Mitral valve: There was mild regurgitation. - Left atrium: The atrium was severely dilated. - Pulmonary arteries: Systolic pressure was mildly increased. PA   peak pressure: 44 mm Hg (S).  Impressions:  - Normal LV systolic function; mild diastolic dysfunction; mild MR;   severe LAE; mild TR with mild pulmonary hypertension.  Antimicrobials:  Ceftriaxone 7/5 >> 7/9  Cefotetan 7/10  Interval history/Subjective: Feels fine.  Tolerating liquids.  No nausea or vomiting.  Objective: Vitals:  Vitals:   06/10/18 1607 06/10/18 1610  BP: (!) 119/49 (!) 117/51  Pulse: 89   Resp:    Temp: 98.3 F (36.8 C)   SpO2: 99%     Exam: Constitutional:   . Appears calm and comfortable, sitting in chair Respiratory:  . CTA bilaterally, no w/r/r.  . Respiratory effort normal Cardiovascular:  . RRR, no m/r/g Psychiatric:  . Mental status o Mood, affect appropriate  I have personally reviewed the following:   Labs:  Blood sugars stable.  Sodium 131.  BUN 25.  Creatinine within normal limits.  Scheduled Meds: . acetaminophen  1,000 mg Oral Q8H  . chlorhexidine  15 mL Mouth Rinse BID  . heparin injection (subcutaneous)  5,000 Units Subcutaneous Q8H  . insulin aspart  0-15 Units Subcutaneous Q6H  . mouth rinse  15 mL Mouth Rinse q12n4p  . metoCLOPramide (REGLAN) injection  5 mg Intravenous Q8H  . pantoprazole  40 mg Intravenous Q12H  . sodium chloride flush  10-40 mL Intracatheter Q12H   Continuous Infusions: . sodium  chloride 10 mL/hr at 06/09/18 1156  . lactated ringers 10 mL/hr at 06/07/18 1410  . TPN ADULT (ION) 70 mL/hr at 06/09/18 1803  . TPN ADULT (ION)      Principal Problem:   Hiatal hernia Active Problems:   Essential hypertension, benign   GERD (gastroesophageal reflux disease)   Chronic diastolic CHF (congestive heart failure) ([place])   Acute lower UTI   Acute urinary retention   LOS: 6 days

## 2018-06-10 NOTE — Consult Note (Signed)
   [facility] CM Inpatient Consult   06/10/2018  [name] [birth year] [id number]   Patientassessed for high risk score for unplanned re-admisssion [facility] services.  Chart review reveals patient is from [facility] and has been there for months.  Current plan is for her to return to [facility] per inpatient social worker's notes. No current post hospital follow up needs noted.  For questions contact:   Natividad Brood, RN BSN [facility] Liaison  [phone] business mobile phone Toll free office [phone]

## 2018-06-10 NOTE — Progress Notes (Addendum)
Notified practitioner of zero bladder scan and patient not voiding since foley catheter removed around 5pm 06/09/18

## 2018-06-11 LAB — GLUCOSE, CAPILLARY
GLUCOSE-CAPILLARY: 198 mg/dL — AB (ref 70–99)
GLUCOSE-CAPILLARY: 125 mg/dL — AB (ref 70–99)
Glucose-Capillary: 125 mg/dL — ABNORMAL HIGH (ref 70–99)

## 2018-06-11 LAB — BASIC METABOLIC PANEL
ANION GAP: 5 (ref 5–15)
BUN: 19 mg/dL (ref 8–23)
CALCIUM: 8.5 mg/dL — AB (ref 8.9–10.3)
CHLORIDE: 100 mmol/L (ref 98–111)
CO2: 26 mmol/L (ref 22–32)
Creatinine, Ser: 0.47 mg/dL (ref 0.44–1.00)
GFR calc non Af Amer: >60 mL/min (ref >60)
GLUCOSE: 112 mg/dL — AB (ref 70–99)
Potassium: 4.1 mmol/L (ref 3.5–5.1)
Sodium: 131 mmol/L — ABNORMAL LOW (ref 135–145)

## 2018-06-11 LAB — PHOSPHORUS: Phosphorus: 3.4 mg/dL (ref 2.5–4.6)

## 2018-06-11 LAB — MAGNESIUM: Magnesium: 1.5 mg/dL — ABNORMAL LOW (ref 1.7–2.4)

## 2018-06-11 MED ORDER — MAGNESIUM SULFATE 2 GM/50ML IV SOLN
2.0000 g | Freq: Once | INTRAVENOUS | Status: AC
  Administered 2018-06-11: 2 g via INTRAVENOUS
  Filled 2018-06-11: qty 50

## 2018-06-11 MED ORDER — TRAVASOL 10 % IV SOLN
INTRAVENOUS | Status: AC
  Administered 2018-06-11: 18:00:00 via INTRAVENOUS
  Filled 2018-06-11: qty 957.6

## 2018-06-11 MED ORDER — POLYETHYLENE GLYCOL 3350 17 G PO PACK: 17.0000 g | PACK | Freq: Two times a day (BID) | ORAL | Status: DC | PRN

## 2018-06-11 NOTE — Progress Notes (Signed)
PROGRESS NOTE  LARK LANGENFELD BSW:[id number] DOB: 04-Jun-1926 DOA: 06/02/2018 PCP: Deland Pretty, MD  Brief Narrative: 84 year old woman presented with nausea, vomiting, abdominal pain, chest pain secondary to large hiatal hernia.  Troponin was minimally elevated, patient was seen by cardiology, felt to have demand ischemia secondary to large hiatal hernia, pain.  Seen by gastroenterology, underwent EGD.  Assessment/Plan Type III hiatal hernia. S/p EGD 7/7 with findings as below.  Status post hernia repair and PEG tube placement 7/9.   --Management per surgery.  Advance to full liquids today.  Continue TPN per pharmacy/surgery.  Urinary retention, Foley catheter placed 7/5.  Urinary tract infection on admission with sepsis.  Treated with ceftriaxone. --Resolved.  Excellent urine output.  Foley removed 7/11.   Chest pain, demand ischemia echo showed normal LVEF, no wall motion abnormalities.  Cardiology signed off.  No further recommendations. --Symptomatic  Chronic diastolic congestive heart failure. Does not require diuretic. --Remains stable   Continue management per surgery; all medical issues are stable.  Magnesium repleted by pharmacy.  DVT prophylaxis: heparin Code Status: DNR Family Communication:  Disposition Plan: SNF rec by PT    [name], MD  [facility] Direct contact: [phone] --Via amion app OR  --[email or web address]; password [email or web address]  7PM-7AM contact night coverage as above 06/11/2018, 12:13 PM  LOS: 7 days   Consultants:  General surgery  GI  Cardiology  Procedures:  EGD Impression:               - Large type-III paraesophageal hernia. possible                            partial torsion cannot be ruled out                           - Normal mucosa was found in the entire stomach. no                            evidence of active bleeding or ulcer disease.                           - Normal duodenal bulb, first portion of the              duodenum and second portion of the duodenum.                           - No specimens collected.  PICC 7/7  LAPAROSCOPIC REPAIR OF HIATAL HERNIA,  LAPAROSCOPIC #20 GASTROSTOMY  Echo Study Conclusions  - Left ventricle: The cavity size was normal. Wall thickness was   normal. Systolic function was normal. The estimated ejection   fraction was in the range of 55% to 60%. Wall motion was normal;   there were no regional wall motion abnormalities. Doppler   parameters are consistent with abnormal left ventricular   relaxation (grade 1 diastolic dysfunction). - Mitral valve: There was mild regurgitation. - Left atrium: The atrium was severely dilated. - Pulmonary arteries: Systolic pressure was mildly increased. PA   peak pressure: 44 mm Hg (S).  Impressions:  - Normal LV systolic function; mild diastolic dysfunction; mild MR;   severe LAE; mild TR with mild pulmonary hypertension.  Antimicrobials:  Ceftriaxone 7/5 >> 7/9  Cefotetan 7/10  Interval history/Subjective: Some nausea  but no vomiting.  No pain.  Objective: Vitals:  Vitals:   06/11/18 0400 06/11/18 0828  BP: 128/62 (!) 142/51  Pulse: 85   Resp: 17   Temp: 97.8 F (36.6 C) 98.9 F (37.2 C)  SpO2: 98% 99%    Exam: Constitutional:   . Appears calm and comfortable Respiratory:  . CTA bilaterally, no w/r/r.  . Respiratory effort normal.  Cardiovascular:  . RRR, no m/r/g Psychiatric:  . Mental status o Mood, affect appropriate  I have personally reviewed the following:   Labs:  Blood sugars remain stable  BMP unremarkable.  Magnesium low 1.5.  Scheduled Meds: . acetaminophen  1,000 mg Oral Q8H  . chlorhexidine  15 mL Mouth Rinse BID  . heparin injection (subcutaneous)  5,000 Units Subcutaneous Q8H  . insulin aspart  0-15 Units Subcutaneous Q6H  . mouth rinse  15 mL Mouth Rinse q12n4p  . metoCLOPramide (REGLAN) injection  5 mg Intravenous Q8H  . pantoprazole  40 mg Intravenous  Q12H  . sodium chloride flush  10-40 mL Intracatheter Q12H   Continuous Infusions: . sodium chloride 10 mL/hr at 06/09/18 1156  . lactated ringers 10 mL/hr at 06/07/18 1410  . TPN ADULT (ION) 70 mL/hr at 06/11/18 0400  . TPN ADULT (ION)      Principal Problem:   Hiatal hernia Active Problems:   Essential hypertension, benign   GERD (gastroesophageal reflux disease)   Chronic diastolic CHF (congestive heart failure) ([place])   Acute lower UTI   Acute urinary retention   LOS: 7 days

## 2018-06-11 NOTE — Progress Notes (Signed)
[facility] Progress Note  4 Days Post-Op  Subjective: CC:  No complaints. Reports some abdominal pain with movement, otherwise denies pain. Tolerating PO. Denies belching, nausea, or vomiting. Mild reflux. +flatus. Denies BM. At baseline reports intermittent constipation managed with milk of magnesia.   Objective: Vital signs in last 24 hours: Temp:  [97.8 F (36.6 C)-99.4 F (37.4 C)] 98.9 F (37.2 C) (07/13 0828) Pulse Rate:  [60-89] 85 (07/13 0400) Resp:  [17-18] 17 (07/13 0400) BP: (117-142)/(49-62) 142/51 (07/13 0828) SpO2:  [97 %-99 %] 99 % (07/13 0828) Weight:  [79.5 kg (175 lb 4.3 oz)] 79.5 kg (175 lb 4.3 oz) (07/13 0400) Last BM Date: 06/07/18  Intake/Output from previous day: 07/12 0701 - 07/13 0700 In: 240 [P.O.:240] Out: 1000 [Urine:1000] Intake/Output this shift: Total I/O In: 250 [P.O.:120; I.V.:30; IV Piggyback:100] Out: 650 [Urine:650]  PE: Gen:  Alert, NAD, pleasant Card:  Regular rate and rhythm Pulm:  Normal effort  Abd: Soft, non-tender, mid distention, trochar sites clean and dry, gastrostomy clamped. +BS  Skin: warm and dry, no rashes  Psych: A&Ox3   Lab Results:  No results for input(s): WBC, HGB, HCT, PLT in the last 72 hours. BMET Recent Labs    06/10/18 1006 06/11/18 0600  NA 131* 131*  K 3.9 4.1  CL 99 100  CO2 23 26  GLUCOSE 116* 112*  BUN 25* 19  CREATININE 0.52 0.47  CALCIUM 8.4* 8.5*   PT/INR No results for input(s): LABPROT, INR in the last 72 hours. CMP     Component Value Date/Time   NA 131 (L) 06/11/2018 0600   K 4.1 06/11/2018 0600   CL 100 06/11/2018 0600   CO2 26 06/11/2018 0600   GLUCOSE 112 (H) 06/11/2018 0600   BUN 19 06/11/2018 0600   CREATININE 0.47 06/11/2018 0600   CALCIUM 8.5 (L) 06/11/2018 0600   PROT 5.1 (L) 06/09/2018 0833   ALBUMIN 2.4 (L) 06/09/2018 0833   AST 38 06/09/2018 0833   ALT 44 06/09/2018 0833   ALKPHOS 57 06/09/2018 0833   BILITOT 0.3 06/09/2018 0833   GFRNONAA >60  06/11/2018 0600   GFRAA >60 06/11/2018 0600   Lipase     Component Value Date/Time   LIPASE 22 06/02/2018 2053       Studies/Results: No results found.  Anti-infectives: Anti-infectives (From admission, onward)   Start     Dose/Rate Route Frequency Ordered Stop   06/07/18 1415  cefoTEtan (CEFOTAN) 1 g in sodium chloride 0.9 % 100 mL IVPB  Status:  Discontinued     1 g 200 mL/hr over 30 Minutes Intravenous To ShortStay Surgical 06/07/18 1405 06/07/18 1409   06/07/18 1415  cefoTEtan in Dextrose 5% (CEFOTAN) IVPB 2 g  Status:  Discontinued     2 g 100 mL/hr over 30 Minutes Intravenous Every 12 hours 06/07/18 1408 06/07/18 1409   06/07/18 1415  cefoTEtan (CEFOTAN) 2 g in sodium chloride 0.9 % 100 mL IVPB  Status:  Discontinued     2 g 200 mL/hr over 30 Minutes Intravenous To ShortStay Surgical 06/07/18 1410 06/07/18 1851   06/07/18 1400  cefoTEtan in Dextrose 5% (CEFOTAN) IVPB 2 g  Status:  Discontinued     2 g 100 mL/hr over 30 Minutes Intravenous To ShortStay Surgical 06/07/18 1359 06/07/18 1409   06/07/18 0600  cefoTEtan in Dextrose 5% (CEFOTAN) IVPB 2 g  Status:  Discontinued     2 g 100 mL/hr over 30 Minutes Intravenous On call to  O.R. 06/06/18 2009 06/07/18 1540   06/03/18 0900  ciprofloxacin (CIPRO) IVPB 400 mg  Status:  Discontinued     400 mg 200 mL/hr over 60 Minutes Intravenous Every 12 hours 06/03/18 0757 06/03/18 0759   06/03/18 0800  cefTRIAXone (ROCEPHIN) 1 g in sodium chloride 0.9 % 100 mL IVPB  Status:  Discontinued     1 g 200 mL/hr over 30 Minutes Intravenous Every 24 hours 06/03/18 0759 06/06/18 1135     Assessment/Plan Chest pain - resolved  UTI - per primary team, treated with Rocephin  Urinary retention  HTN GERD Diastolic CHF  Nausea and vomiting Type III hiatal hernia S/P LAPAROSCOPIC REPAIR OF HH, LAPAROSCOPIC 67F GASTROSTOMY- 06/07/2018 - D. Newman - POD#4, afebrile, VSS -Keep gastrostomy tube clamped, advance  diet to full liquids + protein shakes  - Minimize narcotics as able,scheduled p.o. Tylenol - OOB/mobilize,continuePT/OT- currently recommending SNF - Incentive spirometry q 1h!!   - PRN miralax   FEN: G tube clamped; full liquid diet + protein shakes  ID: Rocephin 7/5-7/8, perioperative cefotetan 7/9  VTE: SCD's,subcutaneous heparin Foley: in place x5days, removed 7/11.     LOS: 7 days    [facility] 06/11/2018, 11:20 AM Pager: [phone] Consults: [phone] Mon-Fri 7:00 am-4:30 pm Sat-Sun 7:00 am-11:30 am

## 2018-06-11 NOTE — Progress Notes (Signed)
PHARMACY - ADULT TOTAL PARENTERAL NUTRITION CONSULT NOTE   Pharmacy Consult:  TPN Indication:  Obstruction from hiatal hernia  Patient Measurements: Height: 5\' 1"  (154.9 cm) Weight: 175 lb 4.3 oz (79.5 kg) IBW/kg (Calculated) : 47.8 TPN AdjBW (KG): 53.1 Body mass index is 33.12 kg/m. Usual Weight: 68-70 kg  Assessment:  [name] presented from SNF on 06/02/18 with intractable nausea, vomiting, and abdominal/chest pain.  Patient has a history of a large hiatal hernia but is not a good surgical candidate.  She started to have coffee-ground emesis 06/04/18 and EGD showed no evidence of ulcer or active bleeding.    Patient denies recent weight loss and is at her usual weight.  She was eating 1/3 - 1/2 of her meal trays PTA.  She states that feeding tube is uncomfortable but she will give it a try if needed to maintain her nutrition.  Spoke to [facility], placing a post-pyloric feeding tube is not feasible d/t anatomy/hernia.  Even with surgery, patient will not be able to have adequate nutrition anytime soon.  Pharmacy consulted to manage TPN.  GI: hx GERD/hiatal hernia.  S/p hernia repair with Gtube placement 7/9.  BL prealbumin 14.5, LBM 7/9 - Reglan, PPI IV, PRN Zofran Endo: no hx DM - CBGs well controlled Insulin requirements in the past 24 hours: 2 units sSSI Lytes: low Na and Mag, others WNL Renal: SCr 0.49 stable, BUN WNL - UOP 0.5 ml/kg/hr, NS at 10/hr, LR at 10/hr Pulm: stable on RA Cards: HTN/HLD/CHF - VSS - PRN hydralazine Hepatobil: LFTs / tbili / TG WNL Neuro: pain score 0, on scheduled APAP ID: s/p 4d CTX for E.coli UTI, CoNS in BCx (?contaminant) - afebrile, WBC up 17.7 post-op TPN Access: PICC placed 06/05/18 TPN start date: 06/06/18  Nutritional Goals (RD rec on 7/9): 1500-1800 kCal and 85-105gm protein per day  Current Nutrition:  TPN Clear liquid diet   Plan:  Continue TPN at goal rate of 70 ml/hr, providing 96g AA, 286g CHO and 44g ILE for a total of 1774 kCal, meeting 100%  of patient's needs. Electrolytes in TPN: increase Na and Mag, Cl:Ac 1:1 Daily multivitamin and trace elements in TPN D/C SSI/CBG checks Mag sulfate 2gm IV x 1 Standard TPN labs on Monday F/U PO intake/diet advancement   Sol Odor D. Mina Marble, PharmD, BCPS, [place] 06/11/2018, 7:57 AM

## 2018-06-12 ENCOUNTER — Inpatient Hospital Stay (HOSPITAL_COMMUNITY): Payer: Medicare Other

## 2018-06-12 LAB — BASIC METABOLIC PANEL
Anion gap: 6 (ref 5–15)
BUN: 21 mg/dL (ref 8–23)
CO2: 26 mmol/L (ref 22–32)
Calcium: 8.3 mg/dL — ABNORMAL LOW (ref 8.9–10.3)
Chloride: 98 mmol/L (ref 98–111)
Creatinine, Ser: 0.48 mg/dL (ref 0.44–1.00)
Glucose, Bld: 115 mg/dL — ABNORMAL HIGH (ref 70–99)
Potassium: 4.3 mmol/L (ref 3.5–5.1)
SODIUM: 130 mmol/L — AB (ref 135–145)

## 2018-06-12 LAB — GLUCOSE, CAPILLARY
GLUCOSE-CAPILLARY: 100 mg/dL — AB (ref 70–99)
GLUCOSE-CAPILLARY: 120 mg/dL — AB (ref 70–99)
GLUCOSE-CAPILLARY: 121 mg/dL — AB (ref 70–99)

## 2018-06-12 MED ORDER — TRAVASOL 10 % IV SOLN
INTRAVENOUS | Status: AC
  Administered 2018-06-12: 18:00:00 via INTRAVENOUS
  Filled 2018-06-12: qty 957.6

## 2018-06-12 NOTE — Progress Notes (Signed)
Patient's oral intake has increase to 75-100% of all meals over the past 2 days. Patient is tolerating full liquids well with a decrease noted in c/o nausea. Dressing dry an intact to left abdomen. G tube is patent and flushed with 30 ccs of saline without difficulty noted. Patient continues to request an increase in her diet to soft foods. Patient education provided by this nurse as to diet tolerance and slowly weaning to solid foods to allow bowel to tolerate increase. Will notify MD on rounds. TPN continues at 70 Ml/hr via RUE PICC line without difficulty.

## 2018-06-12 NOTE — Progress Notes (Signed)
PHARMACY - ADULT TOTAL PARENTERAL NUTRITION CONSULT NOTE   Pharmacy Consult:  TPN Indication:  Obstruction from hiatal hernia  Patient Measurements: Height: 5\' 1"  (154.9 cm) Weight: 171 lb 11.8 oz (77.9 kg) IBW/kg (Calculated) : 47.8 TPN AdjBW (KG): 53.1 Body mass index is 32.45 kg/m. Usual Weight: 68-70 kg  Assessment:  [name] presented from SNF on 06/02/18 with intractable nausea, vomiting, and abdominal/chest pain.  Patient has a history of a large hiatal hernia but is not a good surgical candidate.  She started to have coffee-ground emesis 06/04/18 and EGD showed no evidence of ulcer or active bleeding.    Patient denies recent weight loss and is at her usual weight.  She was eating 1/3 - 1/2 of her meal trays PTA.  She states that feeding tube is uncomfortable but she will give it a try if needed to maintain her nutrition.  Spoke to [facility], placing a post-pyloric feeding tube is not feasible d/t anatomy/hernia.  Even with surgery, patient will not be able to have adequate nutrition anytime soon.  Pharmacy consulted to manage TPN.  GI: hx GERD/hiatal hernia.  S/p hernia repair with Gtube placement 7/9 - clamped.  BL prealbumin 14.5, LBM 7/9 - Reglan, PPI IV, PRN Zofran.  Nausea, SB appears dilated, awaiting films per Surgery. Endo: no hx DM - CBGs well controlled Insulin requirements in the past 24 hours: 7 units sSSI Lytes: low Na and Mag, others WNL on 7/13 Renal: SCr 0.49 stable, BUN WNL - UOP 1.7 ml/kg/hr, NS at 10/hr, LR at 10/hr Pulm: stable on RA Cards: HTN/HLD/CHF - VSS - PRN hydralazine Hepatobil: LFTs / tbili / TG WNL Neuro: pain score 0, on scheduled APAP ID: s/p 4d CTX for E.coli UTI, CoNS in BCx (?contaminant) - afebrile, WBC up 17.7 post-op TPN Access: PICC placed 06/05/18 TPN start date: 06/06/18  Nutritional Goals (RD rec on 7/9): 1500-1800 kCal and 85-105gm protein per day  Current Nutrition:  TPN Full liquid diet - consuming up to 90% of meals   Plan:  Continue  TPN at goal rate of 70 ml/hr, providing 96g AA, 286g CHO and 44g ILE for a total of 1774 kCal, meeting 100% of patient's needs. Electrolytes in TPN: increase Na and Mag on 7/13, Cl:Ac 1:1 - no change today Daily multivitamin and trace elements in TPN D/C SSI/CBG checks Mag sulfate 2gm IV x 1 given 7/13 Standard TPN labs on Monday F/U clinical status, PO intake/diet advancement to start weaning TPN   Takima Encina D. Mina Marble, PharmD, BCPS, [place] 06/12/2018, 9:40 AM

## 2018-06-12 NOTE — Progress Notes (Signed)
[facility] Progress Note  5 Days Post-Op  Subjective: CC:  C/o increased nausea and belching past 24h. Denies emesis. +flatus. No BM.   Objective: Vital signs in last 24 hours: Temp:  [97.6 F (36.4 C)-98.6 F (37 C)] 97.6 F (36.4 C) (07/14 0800) Pulse Rate:  [85-91] 91 (07/14 0800) Resp:  [18] 18 (07/14 0407) BP: (115-132)/(53-103) 132/103 (07/14 0800) SpO2:  [97 %-100 %] 99 % (07/14 0800) Weight:  [77.9 kg (171 lb 11.8 oz)] 77.9 kg (171 lb 11.8 oz) (07/14 0407) Last BM Date: 06/07/18  Intake/Output from previous day: 07/13 0701 - 07/14 0700 In: 1043.4 [P.O.:290; I.V.:613.4; IV Piggyback:100] Out: 3250 [Urine:3250] Intake/Output this shift: Total I/O In: 120 [P.O.:120] Out: 600 [Urine:600]  PE: Gen:  Alert, NAD, pleasant and cooperative  Card:  Regular rate and rhythm Pulm:  Normal effort Abd: Soft, distended, non-tender, bowel sounds present, reducible umbilical hernia, incisions C/D/I Skin: warm and dry, no rashes  Psych: A&Ox3   Lab Results:  No results for input(s): WBC, HGB, HCT, PLT in the last 72 hours. BMET Recent Labs    06/10/18 1006 06/11/18 0600  NA 131* 131*  K 3.9 4.1  CL 99 100  CO2 23 26  GLUCOSE 116* 112*  BUN 25* 19  CREATININE 0.52 0.47  CALCIUM 8.4* 8.5*   PT/INR No results for input(s): LABPROT, INR in the last 72 hours. CMP     Component Value Date/Time   NA 131 (L) 06/11/2018 0600   K 4.1 06/11/2018 0600   CL 100 06/11/2018 0600   CO2 26 06/11/2018 0600   GLUCOSE 112 (H) 06/11/2018 0600   BUN 19 06/11/2018 0600   CREATININE 0.47 06/11/2018 0600   CALCIUM 8.5 (L) 06/11/2018 0600   PROT 5.1 (L) 06/09/2018 0833   ALBUMIN 2.4 (L) 06/09/2018 0833   AST 38 06/09/2018 0833   ALT 44 06/09/2018 0833   ALKPHOS 57 06/09/2018 0833   BILITOT 0.3 06/09/2018 0833   GFRNONAA >60 06/11/2018 0600   GFRAA >60 06/11/2018 0600   Lipase     Component Value Date/Time   LIPASE 22 06/02/2018 2053        Studies/Results: No results found.  Anti-infectives: Anti-infectives (From admission, onward)   Start     Dose/Rate Route Frequency Ordered Stop   06/07/18 1415  cefoTEtan (CEFOTAN) 1 g in sodium chloride 0.9 % 100 mL IVPB  Status:  Discontinued     1 g 200 mL/hr over 30 Minutes Intravenous To ShortStay Surgical 06/07/18 1405 06/07/18 1409   06/07/18 1415  cefoTEtan in Dextrose 5% (CEFOTAN) IVPB 2 g  Status:  Discontinued     2 g 100 mL/hr over 30 Minutes Intravenous Every 12 hours 06/07/18 1408 06/07/18 1409   06/07/18 1415  cefoTEtan (CEFOTAN) 2 g in sodium chloride 0.9 % 100 mL IVPB  Status:  Discontinued     2 g 200 mL/hr over 30 Minutes Intravenous To ShortStay Surgical 06/07/18 1410 06/07/18 1851   06/07/18 1400  cefoTEtan in Dextrose 5% (CEFOTAN) IVPB 2 g  Status:  Discontinued     2 g 100 mL/hr over 30 Minutes Intravenous To ShortStay Surgical 06/07/18 1359 06/07/18 1409   06/07/18 0600  cefoTEtan in Dextrose 5% (CEFOTAN) IVPB 2 g  Status:  Discontinued     2 g 100 mL/hr over 30 Minutes Intravenous On call to O.R. 06/06/18 2009 06/07/18 1540   06/03/18 0900  ciprofloxacin (CIPRO) IVPB 400 mg  Status:  Discontinued  400 mg 200 mL/hr over 60 Minutes Intravenous Every 12 hours 06/03/18 0757 06/03/18 0759   06/03/18 0800  cefTRIAXone (ROCEPHIN) 1 g in sodium chloride 0.9 % 100 mL IVPB  Status:  Discontinued     1 g 200 mL/hr over 30 Minutes Intravenous Every 24 hours 06/03/18 0759 06/06/18 1135       Assessment/Plan Chest pain - resolved  UTI - per primary team, treated with Rocephin  Urinary retention  HTN GERD Diastolic CHF  Nausea and vomiting Type III hiatal hernia S/P LAPAROSCOPIC REPAIR OF HH, LAPAROSCOPIC 66F GASTROSTOMY- 06/07/2018 - D. Newman - POD#5, afebrile, VSS -Keep gastrostomy tube clamped  -  abd film to evaluate nausea and increased distention; suspect  post-op ileus   - continue full liquids + protein  shakes  - Minimize narcotics as able,scheduled p.o. Tylenol - OOB/mobilize,continuePT/OT- currently recommending SNF - Incentive spirometry q 1h!!   FEN: G tube clamped; full liquid diet + protein shakes ID: Rocephin 7/5-7/8, perioperative cefotetan 7/9  VTE: SCD's,subcutaneous heparin Foley: in place x5days,removed 7/11.    LOS: 8 days    Jill Alexanders , [facility] 06/12/2018, 9:11 AM Pager: [phone] Consults: [phone] Mon-Fri 7:00 am-4:30 pm Sat-Sun 7:00 am-11:30 am

## 2018-06-12 NOTE — Progress Notes (Signed)
PROGRESS NOTE  [name] PNT:[id number] DOB: 09-Oct-1926 DOA: 06/02/2018 PCP: Deland Pretty, MD  Brief Narrative: 84 year old woman presented with nausea, vomiting, abdominal pain, chest pain secondary to large hiatal hernia.  Troponin was minimally elevated, patient was seen by cardiology, felt to have demand ischemia secondary to large hiatal hernia, pain.  Seen by gastroenterology, underwent EGD.  Assessment/Plan Type III hiatal hernia. S/p EGD 7/7 with findings as below.  Status post hernia repair and PEG tube placement 7/9.   --Continue management per surgery.  Abdominal film pending to assess for ileus.  Diet per surgery.  TPN per pharmacy, surgery.  Urinary retention, Foley catheter placed 7/5.  Urinary tract infection on admission with sepsis.  Treated with ceftriaxone. --Resolved.  Continues to have excellent urine output.  Chest pain, demand ischemia echo showed normal LVEF, no wall motion abnormalities.  Cardiology signed off.  No further recommendations. --Asymptomatic, no further evaluation planned.  Chronic diastolic congestive heart failure. Does not require diuretic. --Appears euvolemic.   Medical issues appears stable, hospitalization surgical in nature.  No contributing medical issues.  DVT prophylaxis: heparin Code Status: DNR Family Communication:  Disposition Plan: SNF rec by PT    Alexandra Hodgkins, MD  [facility] Direct contact: [phone] --Via amion app OR  --[email or web address]; password [email or web address]  7PM-7AM contact night coverage as above 06/12/2018, 11:25 AM  LOS: 8 days   Consultants:  General surgery  GI  Cardiology  Procedures:  EGD Impression:               - Large type-III paraesophageal hernia. possible                            partial torsion cannot be ruled out                           - Normal mucosa was found in the entire stomach. no                            evidence of active bleeding or ulcer disease.       - Normal duodenal bulb, first portion of the                            duodenum and second portion of the duodenum.                           - No specimens collected.  PICC 7/7  LAPAROSCOPIC REPAIR OF HIATAL HERNIA,  LAPAROSCOPIC #20 GASTROSTOMY  Echo Study Conclusions  - Left ventricle: The cavity size was normal. Wall thickness was   normal. Systolic function was normal. The estimated ejection   fraction was in the range of 55% to 60%. Wall motion was normal;   there were no regional wall motion abnormalities. Doppler   parameters are consistent with abnormal left ventricular   relaxation (grade 1 diastolic dysfunction). - Mitral valve: There was mild regurgitation. - Left atrium: The atrium was severely dilated. - Pulmonary arteries: Systolic pressure was mildly increased. PA   peak pressure: 44 mm Hg (S).  Impressions:  - Normal LV systolic function; mild diastolic dysfunction; mild MR;   severe LAE; mild TR with mild pulmonary hypertension.  Antimicrobials:  Ceftriaxone 7/5 >> 7/9  Cefotetan 7/10  Interval history/Subjective:  Some nausea.  Tolerating liquids.  Objective: Vitals:  Vitals:   06/12/18 0407 06/12/18 0800  BP: (!) 121/53 (!) 132/103  Pulse: 85 91  Resp: 18   Temp: 98.1 F (36.7 C) 97.6 F (36.4 C)  SpO2: 97% 99%    Exam: Constitutional:   . Appears calm and comfortable Respiratory:  . CTA bilaterally, no w/r/r.  . Respiratory effort normal. Cardiovascular:  . RRR, no m/r/g . No LE extremity edema   Psychiatric:  . Mental status o Mood, affect appropriate  I have personally reviewed the following:   Labs:  Urine output 2000, no BM recorded  CBG stable  Scheduled Meds: . acetaminophen  1,000 mg Oral Q8H  . chlorhexidine  15 mL Mouth Rinse BID  . heparin injection (subcutaneous)  5,000 Units Subcutaneous Q8H  . mouth rinse  15 mL Mouth Rinse q12n4p  . metoCLOPramide (REGLAN) injection  5 mg Intravenous Q8H  .  pantoprazole  40 mg Intravenous Q12H  . sodium chloride flush  10-40 mL Intracatheter Q12H   Continuous Infusions: . sodium chloride 10 mL/hr at 06/12/18 0800  . lactated ringers 10 mL/hr at 06/07/18 1410  . TPN ADULT (ION) 70 mL/hr at 06/12/18 0831  . TPN ADULT (ION)      Principal Problem:   Hiatal hernia Active Problems:   Essential hypertension, benign   GERD (gastroesophageal reflux disease)   Chronic diastolic CHF (congestive heart failure) ([place])   Acute lower UTI   Acute urinary retention   LOS: 8 days

## 2018-06-13 LAB — COMPREHENSIVE METABOLIC PANEL
ALBUMIN: 2.3 g/dL — AB (ref 3.5–5.0)
ALK PHOS: 78 U/L (ref 38–126)
ALT: 31 U/L (ref 0–44)
AST: 30 U/L (ref 15–41)
Anion gap: 6 (ref 5–15)
BUN: 20 mg/dL (ref 8–23)
CALCIUM: 8.4 mg/dL — AB (ref 8.9–10.3)
CO2: 27 mmol/L (ref 22–32)
CREATININE: 0.49 mg/dL (ref 0.44–1.00)
Chloride: 98 mmol/L (ref 98–111)
GFR calc Af Amer: >60 mL/min (ref >60)
GLUCOSE: 118 mg/dL — AB (ref 70–99)
Potassium: 4.2 mmol/L (ref 3.5–5.1)
Sodium: 131 mmol/L — ABNORMAL LOW (ref 135–145)
Total Bilirubin: 0.3 mg/dL (ref 0.3–1.2)
Total Protein: 4.9 g/dL — ABNORMAL LOW (ref 6.5–8.1)

## 2018-06-13 LAB — DIFFERENTIAL
Abs Immature Granulocytes: 0.2 10*3/uL — ABNORMAL HIGH (ref 0.0–0.1)
BASOS PCT: 0 %
Basophils Absolute: 0 10*3/uL (ref 0.0–0.1)
Eosinophils Absolute: 0.2 10*3/uL (ref 0.0–0.7)
Eosinophils Relative: 2 %
Immature Granulocytes: 2 %
LYMPHS ABS: 1.4 10*3/uL (ref 0.7–4.0)
Lymphocytes Relative: 13 %
MONO ABS: 1.4 10*3/uL — AB (ref 0.1–1.0)
MONOS PCT: 13 %
Neutro Abs: 7.4 10*3/uL (ref 1.7–7.7)
Neutrophils Relative %: 70 %

## 2018-06-13 LAB — CBC
HCT: 24.8 % — ABNORMAL LOW (ref 36.0–46.0)
Hemoglobin: 8 g/dL — ABNORMAL LOW (ref 12.0–15.0)
MCH: 29.6 pg (ref 26.0–34.0)
MCHC: 32.3 g/dL (ref 30.0–36.0)
MCV: 91.9 fL (ref 78.0–100.0)
PLATELETS: 315 10*3/uL (ref 150–400)
RBC: 2.7 MIL/uL — AB (ref 3.87–5.11)
RDW: 13.7 % (ref 11.5–15.5)
WBC: 10.7 10*3/uL — ABNORMAL HIGH (ref 4.0–10.5)

## 2018-06-13 LAB — PREALBUMIN: Prealbumin: 21.6 mg/dL (ref 18–38)

## 2018-06-13 LAB — PHOSPHORUS: Phosphorus: 3.6 mg/dL (ref 2.5–4.6)

## 2018-06-13 LAB — TRIGLYCERIDES: Triglycerides: 127 mg/dL (ref <150)

## 2018-06-13 LAB — MAGNESIUM: Magnesium: 1.6 mg/dL — ABNORMAL LOW (ref 1.7–2.4)

## 2018-06-13 MED ORDER — TRAVASOL 10 % IV SOLN
INTRAVENOUS | Status: AC
  Administered 2018-06-13: 18:00:00 via INTRAVENOUS
  Filled 2018-06-13: qty 957.6

## 2018-06-13 MED ORDER — MAGNESIUM SULFATE 2 GM/50ML IV SOLN
2.0000 g | Freq: Once | INTRAVENOUS | Status: AC
  Administered 2018-06-13: 2 g via INTRAVENOUS
  Filled 2018-06-13: qty 50

## 2018-06-13 NOTE — Plan of Care (Signed)
  Problem: Education: Goal: Knowledge of General Education information will improve Outcome: Progressing   Problem: Health Behavior/Discharge Planning: Goal: Ability to manage health-related needs will improve Outcome: Progressing   Problem: Clinical Measurements: Goal: Ability to maintain clinical measurements within normal limits will improve Outcome: Progressing   

## 2018-06-13 NOTE — Progress Notes (Signed)
PHARMACY - ADULT TOTAL PARENTERAL NUTRITION CONSULT NOTE   Pharmacy Consult:  TPN Indication:  Obstruction from hiatal hernia  Patient Measurements: Height: 5\' 1"  (154.9 cm) Weight: 171 lb 11.8 oz (77.9 kg) IBW/kg (Calculated) : 47.8 TPN AdjBW (KG): 53.1 Body mass index is 32.45 kg/m. Usual Weight: 68-70 kg  Assessment:  [name] presented from SNF on 06/02/18 with intractable nausea, vomiting, and abdominal/chest pain.  Patient has a history of a large hiatal hernia but is not a good surgical candidate.  She started to have coffee-ground emesis 06/04/18 and EGD showed no evidence of ulcer or active bleeding.    Patient denies recent weight loss and is at her usual weight.  She was eating 1/3 - 1/2 of her meal trays PTA.  She states that feeding tube is uncomfortable but she will give it a try if needed to maintain her nutrition.  Spoke to [facility], placing a post-pyloric feeding tube is not feasible d/t anatomy/hernia.  Even with surgery, patient will not be able to have adequate nutrition anytime soon.  Pharmacy consulted to manage TPN.  GI: hx GERD/hiatal hernia.  S/p hernia repair with Gtube placement 7/9 - clamped. Increased N/V on 7/14 with imaging showing adynamic ileus. NGT back to suction and back to NPO.  BL prealbumin 14.5 >> up to 21.6 (7/15), LBM 7/9 - Reglan, PPI IV, PRN Zofran.  Nausea, SB appears dilated, awaiting films per Surgery. Endo: no hx DM - CBGs well controlled Insulin requirements in the past 24 hours: 2 units sSSI Lytes: Na 131, Mg 1.6, all others wnl on 7/15 Renal: SCr 0.49 stable, BUN WNL - UOP 1 ml/kg/hr, NS at 10/hr, LR at 10/hr Pulm: stable on RA Cards: HTN/HLD/CHF - VSS - PRN hydralazine Hepatobil: LFTs / tbili / TG WNL Neuro: pain score 0, on scheduled APAP ID: s/p 4d CTX for E.coli UTI, CoNS in BCx (?contaminant) - afebrile, WBC 10.7 TPN Access: PICC placed 06/05/18 TPN start date: 06/06/18  Nutritional Goals (RD rec on 7/9): 1500-1800 kCal and 83-104gm protein  per day  Current Nutrition:  TPN Back to NPO on 7/15  Plan:  Continue TPN at goal rate of 70 ml/hr, providing 96g AA, 286g CHO and 44g ILE for a total of 1774 kCal, meeting 100% of patient's needs. Electrolytes in TPN: increase Na and Mag, Cl:Ac 2:1 Daily multivitamin and trace elements in TPN D/C SSI/CBG checks Magnesium sulfate 2g IV x 1 dose today Standard TPN labs on Monday F/U clinical status, PO intake/diet advancement to start weaning TPN  Thank you for allowing pharmacy to be a part of this patient's care.  Alycia Rossetti, PharmD, BCPS Clinical Pharmacist Pager: [phone] Clinical phone for 06/13/2018 from 7a-3:30p: [phone] If after 3:30p, please call main pharmacy at: [phone] 06/13/2018 7:36 AM

## 2018-06-13 NOTE — Progress Notes (Signed)
PROGRESS NOTE  [name] NTZ:[id number] DOB: [birth year] DOA: 06/02/2018 PCP: Deland Pretty, MD  Brief Narrative: 84 year old woman presented with nausea, vomiting, abdominal pain, chest pain secondary to large hiatal hernia.  Troponin was minimally elevated, patient was seen by cardiology, felt to have demand ischemia secondary to large hiatal hernia, pain.  Seen by gastroenterology, underwent EGD.  Subsequently was seen by surgery, underwent hernia repair and PEG tube placement.  Postoperative course has been complicated by ileus, slow advancement of diet  Assessment/Plan Type III hiatal hernia. S/p EGD 7/7 with findings as below.  Status post hernia repair and PEG tube placement 7/9.   --Continue management per surgery including ileus.  TPN per pharmacy.  Urinary retention, Foley catheter placed 7/5.  Urinary tract infection on admission with sepsis.  Treated with ceftriaxone. --Resolved.  Chest pain, demand ischemia echo showed normal LVEF, no wall motion abnormalities.  Cardiology signed off.  No further recommendations. --Remains asymptomatic, no further evaluation planned.  Chronic diastolic congestive heart failure.  --Appears euvolemic.  Only diuretic as an outpatient and spironolactone which she has not required here.   Medical issues remained stable.  DVT prophylaxis: heparin Code Status: DNR Family Communication:  Disposition Plan: SNF rec by PT    [name], MD  [facility] Direct contact: [phone] --Via amion app OR  --[email or web address]; password [email or web address]  7PM-7AM contact night coverage as above 06/13/2018, 3:35 PM  LOS: 9 days   Consultants:  General surgery  GI  Cardiology  Procedures:  EGD Impression:               - Large type-III paraesophageal hernia. possible                            partial torsion cannot be ruled out                           - Normal mucosa was found in the entire stomach. no                            evidence  of active bleeding or ulcer disease.                           - Normal duodenal bulb, first portion of the                            duodenum and second portion of the duodenum.                           - No specimens collected.  PICC 7/7  LAPAROSCOPIC REPAIR OF HIATAL HERNIA,  LAPAROSCOPIC #20 GASTROSTOMY  Echo Study Conclusions  - Left ventricle: The cavity size was normal. Wall thickness was   normal. Systolic function was normal. The estimated ejection   fraction was in the range of 55% to 60%. Wall motion was normal;   there were no regional wall motion abnormalities. Doppler   parameters are consistent with abnormal left ventricular   relaxation (grade 1 diastolic dysfunction). - Mitral valve: There was mild regurgitation. - Left atrium: The atrium was severely dilated. - Pulmonary arteries: Systolic pressure was mildly increased. PA   peak pressure: 44 mm Hg (S).  Impressions:  - Normal  LV systolic function; mild diastolic dysfunction; mild MR;   severe LAE; mild TR with mild pulmonary hypertension.  Antimicrobials:  Ceftriaxone 7/5 >> 7/9  Cefotetan 7/10  Interval history/Subjective: Feels okay.  No vomiting.  Objective: Vitals:  Vitals:   06/13/18 0515 06/13/18 0757  BP: 131/63 (!) 147/63  Pulse: 88   Resp:  18  Temp: 98.1 F (36.7 C) 98 F (36.7 C)  SpO2: 96% 96%    Exam: Constitutional:   . Appears calm and comfortable Respiratory:  . CTA bilaterally, no w/r/r . Respiratory effort normal Cardiovascular:  . RRR, no m/r/g Abdomen:  . Soft, nt Psychiatric:  . Mental status o Mood, affect appropriate I have personally reviewed the following:   Labs:  Urine output 1800, no BM recorded  Sodium 131, remainder BMP unremarkable.  Magnesium slightly low, 1.6.  Phosphorus within normal limits.  LFTs unremarkable.  Hemoglobin stable, 8.0  Scheduled Meds: . acetaminophen  1,000 mg Oral Q8H  . chlorhexidine  15 mL Mouth Rinse BID  .  heparin injection (subcutaneous)  5,000 Units Subcutaneous Q8H  . mouth rinse  15 mL Mouth Rinse q12n4p  . metoCLOPramide (REGLAN) injection  5 mg Intravenous Q8H  . pantoprazole  40 mg Intravenous Q12H  . sodium chloride flush  10-40 mL Intracatheter Q12H   Continuous Infusions: . sodium chloride 10 mL/hr at 06/13/18 0400  . lactated ringers 10 mL/hr at 06/07/18 1410  . TPN ADULT (ION) 70 mL/hr at 06/13/18 0400  . TPN ADULT (ION)      Principal Problem:   Hiatal hernia Active Problems:   Essential hypertension, benign   GERD (gastroesophageal reflux disease)   Chronic diastolic CHF (congestive heart failure) ([place])   Acute lower UTI   Acute urinary retention   LOS: 9 days

## 2018-06-13 NOTE — Progress Notes (Signed)
Physical Therapy Treatment Patient Details Name: [name] MRN: [id number] DOB: 08/20/1926 Today's Date: 06/13/2018    History of Present Illness 65 YOF presented from SNF on 06/02/18 with intractable nausea, vomiting, and abdominal/chest pain.  Patient has a history of a large hiatal hernia but is not a good surgical candidate.  She started to have coffee-ground emesis 06/04/18 and EGD showed no evidence of ulcer or active bleeding. Pt underwent  LAPAROSCOPIC REPAIR OF HH, LAPAROSCOPIC 65F GASTROSTOMY     PT Comments    Pt with very limited mobility today. Had been up in chair earlier and believe pt very fatigued. Continue to recommend SNF.    Follow Up Recommendations  SNF;Supervision/Assistance - 24 hour     Equipment Recommendations  None recommended by PT    Recommendations for Other Services       Precautions / Restrictions Precautions Precautions: Fall Restrictions Weight Bearing Restrictions: No    Mobility  Bed Mobility Overal bed mobility: Needs Assistance Bed Mobility: Rolling;Sidelying to Sit;Sit to Supine Rolling: Mod assist Sidelying to sit: Max assist   Sit to supine: Total assist   General bed mobility comments: Assist to move legs off of bed, elevate trunk into sitting and bring hips to EOB. Assist with all aspects on return to supine.  Transfers                 General transfer comment: Attempted to stand from bed with max assist but pt unable.  Ambulation/Gait                 Stairs             Wheelchair Mobility    Modified Rankin (Stroke Patients Only)       Balance Overall balance assessment: Needs assistance Sitting-balance support: Bilateral upper extremity supported;Feet supported Sitting balance-[name]: Poor Sitting balance - Comments: UE support and intermittent min assist to correct lean Postural control: Posterior lean;Right lateral lean                                  Cognition  Arousal/Alertness: Awake/alert Behavior During Therapy: Flat affect Overall Cognitive Status: Within Functional Limits for tasks assessed                                        Exercises      General Comments        Pertinent Vitals/Pain Pain Assessment: Faces Faces Pain Scale: Hurts even more Pain Location: abdomen Pain Descriptors / Indicators: Grimacing;Guarding Pain Intervention(s): Limited activity within patient's tolerance;Monitored during session;Repositioned    Home Living                      Prior Function            PT Goals (current goals can now be found in the care plan section) Progress towards PT goals: Not progressing toward goals - comment    Frequency    Min 2X/week      PT Plan Current plan remains appropriate    Co-evaluation              AM-PAC PT "6 Clicks" Daily Activity  Outcome Measure  Difficulty turning over in bed (including adjusting bedclothes, sheets and blankets)?: Unable Difficulty moving from lying on back to sitting on  the side of the bed? : Unable Difficulty sitting down on and standing up from a chair with arms (e.g., wheelchair, bedside commode, etc,.)?: Unable Help needed moving to and from a bed to chair (including a wheelchair)?: Total Help needed walking in hospital room?: Total Help needed climbing 3-5 steps with a railing? : Total 6 Click Score: 6    End of Session Equipment Utilized During Treatment: Gait belt Activity Tolerance: Patient limited by fatigue;Patient limited by pain Patient left: with call bell/phone within reach;in bed;with bed alarm set Nurse Communication: Mobility status PT Visit Diagnosis: Unsteadiness on feet (R26.81);Muscle weakness (generalized) (M62.81);Pain Pain - part of body: (abdomen)     Time: 5277-8242 PT Time Calculation (min) (ACUTE ONLY): 21 min  Charges:  $Therapeutic Activity: 8-22 mins                    G Codes:       [facility]  PT [place] 06/13/2018, 5:04 PM

## 2018-06-13 NOTE — Progress Notes (Signed)
Patient ID: [name], female   DOB: 08/20/1926, 84 y.o.   MRN: [id number]    6 Days Post-Op  Subjective: Patient complaints of some upper abdominal pain and emesis x2 since yesterday.  Patient does not ambulate at baseline.  Up in chair.  Objective: Vital signs in last 24 hours: Temp:  [97.6 F (36.4 C)-98.6 F (37 C)] 98.1 F (36.7 C) (07/15 0515) Pulse Rate:  [88-91] 88 (07/15 0515) Resp:  [18] 18 (07/14 2018) BP: (131-145)/(63-103) 131/63 (07/15 0515) SpO2:  [96 %-99 %] 96 % (07/15 0515) Last BM Date: 06/07/18  Intake/Output from previous day: 07/14 0701 - 07/15 0700 In: 1910.2 [P.O.:390; I.V.:1520.2] Out: 2020 [Urine:1800; Emesis/NG output:200; Drains:20] Intake/Output this shift: No intake/output data recorded.  PE: Heart: regular Lungs: CTAB Abd: soft, tender appropriately, some distention, hypoactive BS, incisions are c/d/i, g-tube in place and clamped.  Site is clean.  Lab Results:  Recent Labs    06/13/18 0300  WBC 10.7*  HGB 8.0*  HCT 24.8*  PLT 315   BMET Recent Labs    06/12/18 1636 06/13/18 0300  NA 130* 131*  K 4.3 4.2  CL 98 98  CO2 26 27  GLUCOSE 115* 118*  BUN 21 20  CREATININE 0.48 0.49  CALCIUM 8.3* 8.4*   PT/INR No results for input(s): LABPROT, INR in the last 72 hours. CMP     Component Value Date/Time   NA 131 (L) 06/13/2018 0300   K 4.2 06/13/2018 0300   CL 98 06/13/2018 0300   CO2 27 06/13/2018 0300   GLUCOSE 118 (H) 06/13/2018 0300   BUN 20 06/13/2018 0300   CREATININE 0.49 06/13/2018 0300   CALCIUM 8.4 (L) 06/13/2018 0300   PROT 4.9 (L) 06/13/2018 0300   ALBUMIN 2.3 (L) 06/13/2018 0300   AST 30 06/13/2018 0300   ALT 31 06/13/2018 0300   ALKPHOS 78 06/13/2018 0300   BILITOT 0.3 06/13/2018 0300   GFRNONAA >60 06/13/2018 0300   GFRAA >60 06/13/2018 0300   Lipase     Component Value Date/Time   LIPASE 22 06/02/2018 2053       Studies/Results: Dg Abd Portable 1v  Result Date: 06/12/2018 CLINICAL DATA:   Postoperative nausea EXAM: PORTABLE ABDOMEN - 1 VIEW COMPARISON:  06/04/2018 FINDINGS: Progressive bowel gas compared with the prior study. Mildly distended large and small bowel suggestive of ileus. Gastrostomy tube in the left upper quadrant. NG tube is been removed. Hiatal hernia Lumbar scoliosis and multilevel degenerative change. Left hip replacement IMPRESSION: Adynamic ileus pattern. Electronically Signed   By: Franchot Gallo M.D.   On: 06/12/2018 11:58    Anti-infectives: Anti-infectives (From admission, onward)   Start     Dose/Rate Route Frequency Ordered Stop   06/07/18 1415  cefoTEtan (CEFOTAN) 1 g in sodium chloride 0.9 % 100 mL IVPB  Status:  Discontinued     1 g 200 mL/hr over 30 Minutes Intravenous To ShortStay Surgical 06/07/18 1405 06/07/18 1409   06/07/18 1415  cefoTEtan in Dextrose 5% (CEFOTAN) IVPB 2 g  Status:  Discontinued     2 g 100 mL/hr over 30 Minutes Intravenous Every 12 hours 06/07/18 1408 06/07/18 1409   06/07/18 1415  cefoTEtan (CEFOTAN) 2 g in sodium chloride 0.9 % 100 mL IVPB  Status:  Discontinued     2 g 200 mL/hr over 30 Minutes Intravenous To ShortStay Surgical 06/07/18 1410 06/07/18 1851   06/07/18 1400  cefoTEtan in Dextrose 5% (CEFOTAN) IVPB 2 g  Status:  Discontinued     2 g 100 mL/hr over 30 Minutes Intravenous To ShortStay Surgical 06/07/18 1359 06/07/18 1409   06/07/18 0600  cefoTEtan in Dextrose 5% (CEFOTAN) IVPB 2 g  Status:  Discontinued     2 g 100 mL/hr over 30 Minutes Intravenous On call to O.R. 06/06/18 2009 06/07/18 1540   06/03/18 0900  ciprofloxacin (CIPRO) IVPB 400 mg  Status:  Discontinued     400 mg 200 mL/hr over 60 Minutes Intravenous Every 12 hours 06/03/18 0757 06/03/18 0759   06/03/18 0800  cefTRIAXone (ROCEPHIN) 1 g in sodium chloride 0.9 % 100 mL IVPB  Status:  Discontinued     1 g 200 mL/hr over 30 Minutes Intravenous Every 24 hours 06/03/18 0759 06/06/18 1135       Assessment/Plan Chest pain - resolved  UTI - per  primary team, treated with Rocephin  Urinary retention  HTN GERD Diastolic CHF  Nausea and vomiting Type III hiatal hernia S/P LAPAROSCOPIC REPAIR OF HH, LAPAROSCOPIC 80F GASTROSTOMY- 06/07/2018 - D. Newman - POD#6, afebrile, VSS -return g-tube to gravity.  Patient with ileus and some emesis. - Minimize narcotics as able,scheduled p.o. Tylenol - OOB/mobilize,continuePT/OT- currently recommending SNF - Incentive spirometry  FEN: G tube to gravity ID: Rocephin 7/5-7/8, perioperative cefotetan 7/9  VTE: SCD's,subcutaneous heparin Foley: in place x5days,removed 7/11.purewick in place currently.   LOS: 9 days    Henreitta Cea , [facility] 06/13/2018, 7:55 AM Pager: [phone]

## 2018-06-14 LAB — BASIC METABOLIC PANEL
Anion gap: 5 (ref 5–15)
BUN: 21 mg/dL (ref 8–23)
CO2: 29 mmol/L (ref 22–32)
CREATININE: 0.51 mg/dL (ref 0.44–1.00)
Calcium: 8.1 mg/dL — ABNORMAL LOW (ref 8.9–10.3)
Chloride: 98 mmol/L (ref 98–111)
GFR calc Af Amer: >60 mL/min (ref >60)
Glucose, Bld: 110 mg/dL — ABNORMAL HIGH (ref 70–99)
POTASSIUM: 4.1 mmol/L (ref 3.5–5.1)
Sodium: 132 mmol/L — ABNORMAL LOW (ref 135–145)

## 2018-06-14 LAB — MAGNESIUM: Magnesium: 1.8 mg/dL (ref 1.7–2.4)

## 2018-06-14 MED ORDER — TRAVASOL 10 % IV SOLN
INTRAVENOUS | Status: AC
  Administered 2018-06-14: 18:00:00 via INTRAVENOUS
  Filled 2018-06-14: qty 957.6

## 2018-06-14 MED ORDER — MAGNESIUM SULFATE 2 GM/50ML IV SOLN
2.0000 g | Freq: Once | INTRAVENOUS | Status: AC
  Administered 2018-06-14: 2 g via INTRAVENOUS
  Filled 2018-06-14: qty 50

## 2018-06-14 MED ORDER — BOOST / RESOURCE BREEZE PO LIQD CUSTOM
1.0000 | Freq: Three times a day (TID) | ORAL | Status: DC
  Administered 2018-06-14 (×2): 1 via ORAL

## 2018-06-14 NOTE — Progress Notes (Signed)
No charge note.  Discussed with [name]. [facility] has taken over care.  [facility] will sign off. Please call us [phone]) if we can be of further assistance.  [name], MD [facility] [phone]

## 2018-06-14 NOTE — Progress Notes (Signed)
Patient ID: [name], female   DOB: Feb 14, 1926, 84 y.o.   MRN: [id number]    7 Days Post-Op  Subjective: Pt up in a chair today and states she feels much better today.  No further nausea.  Passed some flatus and had a BM yesterday.    Objective: Vital signs in last 24 hours: Temp:  [97.8 F (36.6 C)-98.4 F (36.9 C)] 98 F (36.7 C) (07/16 0617) Resp:  [18] 18 (07/15 0757) BP: (107-147)/(45-63) 115/58 (07/16 0617) SpO2:  [96 %-98 %] 98 % (07/16 0617) Weight:  [72 kg (158 lb 11.7 oz)-77.1 kg (169 lb 15.6 oz)] 72 kg (158 lb 11.7 oz) (07/16 0617) Last BM Date: 06/07/18  Intake/Output from previous day: 07/15 0701 - 07/16 0700 In: 1283.4 [I.V.:1253.4] Out: 6712 [Urine:650; Drains:1025] Intake/Output this shift: No intake/output data recorded.  PE: Heart: regular Lungs: CTAB and pulling around 1000 on IS Abd: soft, minimally tender, g-tube in place and site clean.  g-tube to gravity with essentially ice chip type output, very clear.  Incisions are c/d/i.  +BS  Lab Results:  Recent Labs    06/13/18 0300  WBC 10.7*  HGB 8.0*  HCT 24.8*  PLT 315   BMET Recent Labs    06/13/18 0300 06/14/18 0553  NA 131* 132*  K 4.2 4.1  CL 98 98  CO2 27 29  GLUCOSE 118* 110*  BUN 20 21  CREATININE 0.49 0.51  CALCIUM 8.4* 8.1*   PT/INR No results for input(s): LABPROT, INR in the last 72 hours. CMP     Component Value Date/Time   NA 132 (L) 06/14/2018 0553   K 4.1 06/14/2018 0553   CL 98 06/14/2018 0553   CO2 29 06/14/2018 0553   GLUCOSE 110 (H) 06/14/2018 0553   BUN 21 06/14/2018 0553   CREATININE 0.51 06/14/2018 0553   CALCIUM 8.1 (L) 06/14/2018 0553   PROT 4.9 (L) 06/13/2018 0300   ALBUMIN 2.3 (L) 06/13/2018 0300   AST 30 06/13/2018 0300   ALT 31 06/13/2018 0300   ALKPHOS 78 06/13/2018 0300   BILITOT 0.3 06/13/2018 0300   GFRNONAA >60 06/14/2018 0553   GFRAA >60 06/14/2018 0553   Lipase     Component Value Date/Time   LIPASE 22 06/02/2018 2053        Studies/Results: Dg Abd Portable 1v  Result Date: 06/12/2018 CLINICAL DATA:  Postoperative nausea EXAM: PORTABLE ABDOMEN - 1 VIEW COMPARISON:  06/04/2018 FINDINGS: Progressive bowel gas compared with the prior study. Mildly distended large and small bowel suggestive of ileus. Gastrostomy tube in the left upper quadrant. NG tube is been removed. Hiatal hernia Lumbar scoliosis and multilevel degenerative change. Left hip replacement IMPRESSION: Adynamic ileus pattern. Electronically Signed   By: Franchot Gallo M.D.   On: 06/12/2018 11:58    Anti-infectives: Anti-infectives (From admission, onward)   Start     Dose/Rate Route Frequency Ordered Stop   06/07/18 1415  cefoTEtan (CEFOTAN) 1 g in sodium chloride 0.9 % 100 mL IVPB  Status:  Discontinued     1 g 200 mL/hr over 30 Minutes Intravenous To ShortStay Surgical 06/07/18 1405 06/07/18 1409   06/07/18 1415  cefoTEtan in Dextrose 5% (CEFOTAN) IVPB 2 g  Status:  Discontinued     2 g 100 mL/hr over 30 Minutes Intravenous Every 12 hours 06/07/18 1408 06/07/18 1409   06/07/18 1415  cefoTEtan (CEFOTAN) 2 g in sodium chloride 0.9 % 100 mL IVPB  Status:  Discontinued  2 g 200 mL/hr over 30 Minutes Intravenous To ShortStay Surgical 06/07/18 1410 06/07/18 1851   06/07/18 1400  cefoTEtan in Dextrose 5% (CEFOTAN) IVPB 2 g  Status:  Discontinued     2 g 100 mL/hr over 30 Minutes Intravenous To ShortStay Surgical 06/07/18 1359 06/07/18 1409   06/07/18 0600  cefoTEtan in Dextrose 5% (CEFOTAN) IVPB 2 g  Status:  Discontinued     2 g 100 mL/hr over 30 Minutes Intravenous On call to O.R. 06/06/18 2009 06/07/18 1540   06/03/18 0900  ciprofloxacin (CIPRO) IVPB 400 mg  Status:  Discontinued     400 mg 200 mL/hr over 60 Minutes Intravenous Every 12 hours 06/03/18 0757 06/03/18 0759   06/03/18 0800  cefTRIAXone (ROCEPHIN) 1 g in sodium chloride 0.9 % 100 mL IVPB  Status:  Discontinued     1 g 200 mL/hr over 30 Minutes Intravenous Every 24 hours  06/03/18 0759 06/06/18 1135       Assessment/Plan Chest pain - resolved  UTI - per primary team, treated with Rocephin  Urinary retention  HTN GERD Diastolic CHF - stable  Nausea and vomiting Type III hiatal hernia S/P LAPAROSCOPIC REPAIR OF HH, LAPAROSCOPIC 39F GASTROSTOMY- 06/07/2018 - D. Newman - POD#7, afebrile, VSS -+ bowel function.  Clamp g-tube and restart clear liquids.             -  Already on reglan 5mg  q 8 hrs.  Would like to stop this ASAP - Minimize narcotics as able,scheduled p.o. Tylenol - OOB/mobilize,continuePT/OT- currently recommending SNF - Incentive spirometry  FEN: G tube clamp, clear liquids, on TNA ID: Rocephin 7/5-7/8, perioperative cefotetan 7/9  VTE: SCD's,subcutaneous heparin Foley: in place x5days,removed 7/11.purewick in place currently. Follow up - [facility] - plan for SNF once tolerating a diet.  Patient has been transferred to our service today.   LOS: 10 days    Henreitta Cea , [facility] Surgery 06/14/2018, 7:48 AM Pager: [phone]

## 2018-06-14 NOTE — Progress Notes (Signed)
PHARMACY - ADULT TOTAL PARENTERAL NUTRITION CONSULT NOTE   Pharmacy Consult:  TPN Indication:  Obstruction from hiatal hernia  Patient Measurements: Height: 5\' 1"  (154.9 cm) Weight: 158 lb 11.7 oz (72 kg) IBW/kg (Calculated) : 47.8 TPN AdjBW (KG): 53.1 Body mass index is 29.99 kg/m. Usual Weight: 68-70 kg  Assessment:  [name] presented from SNF on 06/02/18 with intractable nausea, vomiting, and abdominal/chest pain.  Patient has a history of a large hiatal hernia but is not a good surgical candidate.  She started to have coffee-ground emesis 06/04/18 and EGD showed no evidence of ulcer or active bleeding.    Patient denies recent weight loss and is at her usual weight.  She was eating 1/3 - 1/2 of her meal trays PTA.  She states that feeding tube is uncomfortable but she will give it a try if needed to maintain her nutrition.  Spoke to [facility], placing a post-pyloric feeding tube is not feasible d/t anatomy/hernia.  Even with surgery, patient will not be able to have adequate nutrition anytime soon.  Pharmacy consulted to manage TPN.  GI: hx GERD/hiatal hernia.  S/p hernia repair with Gtube placement 7/9 - clamped. Increased N/V on 7/14 with imaging showing adynamic ileus. NGT back to gravity and pt back to NPO. NGT output: 700 cc.  BL prealbumin 14.5 >> up to 21.6 (7/15), LBM 7/9 - Reglan, PPI IV, PRN Zofran.  . Endo: no hx DM - CBGs well controlled Insulin requirements in the past 24 hours: 2 units sSSI Lytes: Na 132, Mg 1.8, all others wnl on 7/15 Renal: SCr 0.51 stable, BUN WNL - UOP 0.4 ml/kg/hr, NS at 10/hr, LR at 10/hr Pulm: stable on RA Cards: HTN/HLD/CHF - VSS - PRN hydralazine Hepatobil: LFTs / tbili / TG WNL Neuro: pain score 0, on scheduled APAP ID: s/p 4d CTX for E.coli UTI, CoNS in BCx (?contaminant) - afebrile, WBC 10.7 TPN Access: PICC placed 06/05/18 TPN start date: 06/06/18  Nutritional Goals (RD rec on 7/9): 1500-1800 kCal and 83-104gm protein per day  Current Nutrition:   TPN Back to NPO on 7/15  Plan:  Continue TPN at goal rate of 70 ml/hr, providing 96g AA, 286g CHO and 44g ILE for a total of 1774 kCal, meeting 100% of patient's needs. Electrolytes in TPN: increased Na and Mag on 7/15, Cl:Ac 2:1 Daily multivitamin and trace elements in TPN D/C SSI/CBG checks Repeat Magnesium sulfate 2g IV x 1 dose today F/u BMET, Mg with AM labs F/U clinical status, PO intake/diet advancement to start weaning TPN  Thank you for allowing pharmacy to be a part of this patient's care.  Alycia Rossetti, PharmD, BCPS Clinical Pharmacist Pager: [phone] Clinical phone for 06/14/2018 from 7a-3:30p: [phone] If after 3:30p, please call main pharmacy at: [phone] 06/14/2018 7:17 AM

## 2018-06-14 NOTE — Plan of Care (Signed)
  Problem: Education: Goal: Knowledge of General Education information will improve Outcome: Progressing   Problem: Clinical Measurements: Goal: Ability to maintain clinical measurements within normal limits will improve Outcome: Progressing   

## 2018-06-14 NOTE — Progress Notes (Signed)
Nutrition Follow Up  DOCUMENTATION CODES:   Not applicable  INTERVENTION:    TPN per pharmacy  Boost Breeze po TID, each supplement provides 250 kcal and 9 grams of protein  NUTRITION DIAGNOSIS:   Inadequate oral intake related to altered GI function as evidenced by (need for TPN support), ongoing  GOAL:   Patient will meet greater than or equal to 90% of their needs, met  MONITOR:   Diet advancement, PO intake, Supplement acceptance, Labs, Skin, Weight trends, I & O's  ASSESSMENT:   Patient has a PMH of HTN, HLD, GERD, mitral valve regurgitation, CHF, hiatal hernia, who presents with intractable nausea, vomiting, abdominal and chest pain likely related to hiatal hernia that contains most of her stomach in the sac.She vomited all day 7/5, had coffee ground emesis 7/6 with NGT inserted to suction which led to an improvement in her symptoms. EGD done 7/7 that found type III hiatal hernia with inflammation and erosion but no evidence of ulcer or active bleeding. Started on TPN on 7/8 due to inability to take PO. Surgical hiatal hernia repair and gastrostomy tube placement are likely next steps being discussed with the family at this point.   7/08 TPN started 7/09 hiatal hernia repair, #20 gastrostomy  7/14 ABG X-ray showed ileus  Pt currently on Clear Liquids.  Feeling better. Nausea has subsided. Had a small BM yesterday. CCS note reviewed. G-tube clamped. Clear output this am.   Medications reviewed. On IV Reglan. Labs reviewed. Na 132 (L).  CBG's 121-120-100.  TPN is at goal rate of 70 ml/hr. Providing 1774 kcals and 96 gm protein. Meeting 100% of estimated nutritional needs. Receiving daily MVI in TPN.  Diet Order:   Diet Order           Diet clear liquid Room service appropriate? Yes; Fluid consistency: Thin  Diet effective now         EDUCATION NEEDS:   No education needs have been identified at this time  Skin:  Skin Assessment: Reviewed RN  Assessment  Last BM:  7/15   Height:   Ht Readings from Last 1 Encounters:  06/03/18 _0  (1.549 m)   Weight:   Wt Readings from Last 1 Encounters:  06/14/18 158 lb 11.7 oz (72 kg)   Ideal Body Weight:  47.72 kg  BMI:  Body mass index is 29.99 kg/m.  Estimated Nutritional Needs:   Kcal:  1500-1700  Protein:  85-100 gm  Fluid:  1.5-1.7 L  [name], RD, LDN Pager #: [phone] After-Hours Pager #: [phone]

## 2018-06-15 MED ORDER — ZOLPIDEM TARTRATE 5 MG PO TABS
5.0000 mg | ORAL_TABLET | Freq: Once | ORAL | Status: AC
  Administered 2018-06-15: 5 mg via ORAL
  Filled 2018-06-15: qty 1

## 2018-06-15 MED ORDER — TRAVASOL 10 % IV SOLN
INTRAVENOUS | Status: AC
  Administered 2018-06-15: 18:00:00 via INTRAVENOUS
  Filled 2018-06-15: qty 889.2

## 2018-06-15 NOTE — Progress Notes (Signed)
Clinical Social Worker following patient for support and discharge needs. Patient is from [facility]. Patient is wanting to go back once medically cleared by MD.   Rhea Pink, MSW,  [place]

## 2018-06-15 NOTE — Progress Notes (Signed)
PHARMACY - ADULT TOTAL PARENTERAL NUTRITION CONSULT NOTE   Pharmacy Consult:  TPN Indication:  Obstruction from hiatal hernia  Patient Measurements: Height: 5\' 1"  (154.9 cm) Weight: 158 lb 11.7 oz (72 kg) IBW/kg (Calculated) : 47.8 TPN AdjBW (KG): 53.1 Body mass index is 29.99 kg/m. Usual Weight: 68-70 kg  Assessment:  [name] presented from SNF on 06/02/18 with intractable nausea, vomiting, and abdominal/chest pain.  Patient has a history of a large hiatal hernia but is not a good surgical candidate.  She started to have coffee-ground emesis 06/04/18 and EGD showed no evidence of ulcer or active bleeding.    Patient denies recent weight loss and is at her usual weight.  She was eating 1/3 - 1/2 of her meal trays PTA.  She states that feeding tube is uncomfortable but she will give it a try if needed to maintain her nutrition.  Spoke to [facility], placing a post-pyloric feeding tube is not feasible d/t anatomy/hernia.  Even with surgery, patient will not be able to have adequate nutrition anytime soon.  Pharmacy consulted to manage TPN.  GI: hx GERD/hiatal hernia.  S/p hernia repair with Gtube placement 7/9. Increased N/V on 7/14 with imaging showing adynamic ileus. NGT clamped again 7/16 and back to clears, back to suction and NPO on 7/17 d/t N/V. Will hold off on weaning TPN. BL prealbumin 14.5 >> up to 21.6 (7/15), LBM 7/15 - Reglan, PPI IV, PRN Zofran.  . Endo: no hx DM - CBGs well controlled Insulin requirements in the past 24 hours: 2 units sSSI Lytes: Na 132, Mg 1.8, all others wnl on 7/15 Renal: SCr 0.51 stable, BUN WNL - UOP 0.4 ml/kg/hr, NS at 10/hr, LR at 10/hr Pulm: stable on RA Cards: HTN/HLD/CHF - VSS - PRN hydralazine Hepatobil: LFTs / tbili / TG WNL Neuro: pain score 0, on scheduled APAP ID: s/p 4d CTX for E.coli UTI, CoNS in BCx (?contaminant) - afebrile, WBC 10.7 TPN Access: PICC placed 06/05/18 TPN start date: 06/06/18  Nutritional Goals (RD rec on 7/16): 1500-1700 kCal and  85-100gm protein per day  Current Nutrition:  TPN Boost 1 can 3x/day (each can provides 250 kcal and 9g protein) - 2 charted on 7/16  Plan:  Reduce TPN slightly to 65 ml/hr for adjusted RD goals - will provide 89g AA, 265g CHO and 39g ILE for a total of 1647 kCal, meeting 100% of patient's needs. Electrolytes in TPN: increased Na and Mag on 7/15, Cl:Ac 2:1 Daily multivitamin and trace elements in TPN F/u TPN labs on Thursday F/U clinical status, possibility of weaning TPN if tolerating po intake/diet advancement  Thank you for allowing pharmacy to be a part of this patient's care.  Alycia Rossetti, PharmD, BCPS Clinical Pharmacist Pager: [phone] Clinical phone for 06/15/2018 from 7a-3:30p: [phone] If after 3:30p, please call main pharmacy at: [phone] 06/15/2018 7:35 AM

## 2018-06-15 NOTE — Progress Notes (Signed)
Physical Therapy Treatment Patient Details Name: [name] MRN: [id number] DOB: Nov 06, 1926 Today's Date: 06/15/2018    History of Present Illness 21 YOF presented from SNF on 06/02/18 with intractable nausea, vomiting, and abdominal/chest pain.  Patient has a history of a large hiatal hernia but is not a good surgical candidate.  She started to have coffee-ground emesis 06/04/18 and EGD showed no evidence of ulcer or active bleeding. Pt underwent  LAPAROSCOPIC REPAIR OF HH, LAPAROSCOPIC 2F GASTROSTOMY     PT Comments    Pt awake agreeable to participate. Reports ABD pain when moving otherwise comfortable. Session with focus on improving BLE and core strength to improve independence and tolerance to bed mobility and sitting. Pt activating key muscle groups well and able to count reps without assistance. Pt remains comfortable during session. Good activation of gluteals with bridging but clear demonstration of severe weakness in hips. Will follow acutely.    Follow Up Recommendations  SNF;Supervision/Assistance - 24 hour     Equipment Recommendations  None recommended by PT    Recommendations for Other Services       Precautions / Restrictions Precautions Precautions: Fall Precaution Comments: gastrostomy tube Restrictions Weight Bearing Restrictions: No    Mobility  Bed Mobility   Bed Mobility: Rolling Rolling: Mod assist;+2 for physical assistance(rolling initition x14 in session for exercise. Support given at scapulae)            Transfers                    Ambulation/Gait                 Stairs             Wheelchair Mobility    Modified Rankin (Stroke Patients Only)       Balance                                            Cognition Arousal/Alertness: Awake/alert Behavior During Therapy: WFL for tasks assessed/performed Overall Cognitive Status: Within Functional Limits for tasks assessed                                        Exercises General Exercises - Lower Extremity Ankle Circles/Pumps: AROM;Both;Supine;15 reps;AAROM Short Arc Quad: AROM;Supine;Both;15 reps Heel Slides: AAROM;Supine;Both;15 reps Hip ABduction/ADduction: AAROM;Supine;Both;15 reps Hip Flexion/Marching: Both;10 reps;Supine;AROM Mini-Sqauts: Supine;Strengthening;Both;10 reps Other Exercises Other Exercises: hooklying bridge initiation: 1x10    General Comments        Pertinent Vitals/Pain Pain Assessment: Faces Faces Pain Scale: Hurts little more(only while moving ) Pain Location: abdomen Pain Intervention(s): Limited activity within patient's tolerance    Home Living                      Prior Function            PT Goals (current goals can now be found in the care plan section) Acute Rehab PT Goals Patient Stated Goal: to go home PT Goal Formulation: With patient Time For Goal Achievement: 06/22/18 Potential to Achieve Goals: Good Progress towards PT goals: Progressing toward goals    Frequency    Min 2X/week      PT Plan Current plan remains appropriate    Co-evaluation  AM-PAC PT "6 Clicks" Daily Activity  Outcome Measure  Difficulty turning over in bed (including adjusting bedclothes, sheets and blankets)?: Unable Difficulty moving from lying on back to sitting on the side of the bed? : Unable Difficulty sitting down on and standing up from a chair with arms (e.g., wheelchair, bedside commode, etc,.)?: Unable Help needed moving to and from a bed to chair (including a wheelchair)?: Total Help needed walking in hospital room?: Total Help needed climbing 3-5 steps with a railing? : Total 6 Click Score: 6    End of Session   Activity Tolerance: Patient limited by fatigue;Patient limited by pain Patient left: with call bell/phone within reach;in bed;with bed alarm set(chair'ed out position) Nurse Communication: Mobility status PT Visit Diagnosis:  Unsteadiness on feet (R26.81);Muscle weakness (generalized) (M62.81);Pain     Time: 3419-3790 PT Time Calculation (min) (ACUTE ONLY): 21 min  Charges:  $Therapeutic Exercise: 8-22 mins                    G Codes:       12:20 PM, 06-26-2018 [name], PT, DPT Physical Therapist - [place] [phone] (Pager)  [phone] (Office)      [name] C 2018-06-26, 12:19 PM

## 2018-06-15 NOTE — Progress Notes (Signed)
Patient ID: [name], female   DOB: 06-08-1926, 84 y.o.   MRN: [id number]    8 Days Post-Op  Subjective: Pt in bed.  Doesn't feel great.  More nausea and vomiting since returning to clear liquids.  Objective: Vital signs in last 24 hours: Temp:  [98.7 F (37.1 C)-99.1 F (37.3 C)] 99 F (37.2 C) (07/16 2027) Pulse Rate:  [84-91] 88 (07/16 2027) Resp:  [16-18] 16 (07/16 2027) BP: (106-129)/(48-57) 106/50 (07/16 2027) SpO2:  [94 %-98 %] 96 % (07/16 2027) Last BM Date: 06/13/18  Intake/Output from previous day: 07/16 0701 - 07/17 0700 In: 3240.2 [P.O.:1080; I.V.:2110.2; IV Piggyback:50] Out: 1000 [Urine:800; Drains:200] Intake/Output this shift: No intake/output data recorded.  PE: Gen: frail Heart: regular Lungs: CTAB Abd: soft, some BS, g-tube is clean and intact.  Incisions are healing well  Lab Results:  Recent Labs    06/13/18 0300  WBC 10.7*  HGB 8.0*  HCT 24.8*  PLT 315   BMET Recent Labs    06/13/18 0300 06/14/18 0553  NA 131* 132*  K 4.2 4.1  CL 98 98  CO2 27 29  GLUCOSE 118* 110*  BUN 20 21  CREATININE 0.49 0.51  CALCIUM 8.4* 8.1*   PT/INR No results for input(s): LABPROT, INR in the last 72 hours. CMP     Component Value Date/Time   NA 132 (L) 06/14/2018 0553   K 4.1 06/14/2018 0553   CL 98 06/14/2018 0553   CO2 29 06/14/2018 0553   GLUCOSE 110 (H) 06/14/2018 0553   BUN 21 06/14/2018 0553   CREATININE 0.51 06/14/2018 0553   CALCIUM 8.1 (L) 06/14/2018 0553   PROT 4.9 (L) 06/13/2018 0300   ALBUMIN 2.3 (L) 06/13/2018 0300   AST 30 06/13/2018 0300   ALT 31 06/13/2018 0300   ALKPHOS 78 06/13/2018 0300   BILITOT 0.3 06/13/2018 0300   GFRNONAA >60 06/14/2018 0553   GFRAA >60 06/14/2018 0553   Lipase     Component Value Date/Time   LIPASE 22 06/02/2018 2053       Studies/Results: No results found.  Anti-infectives: Anti-infectives (From admission, onward)   Start     Dose/Rate Route Frequency Ordered Stop   06/07/18  1415  cefoTEtan (CEFOTAN) 1 g in sodium chloride 0.9 % 100 mL IVPB  Status:  Discontinued     1 g 200 mL/hr over 30 Minutes Intravenous To ShortStay Surgical 06/07/18 1405 06/07/18 1409   06/07/18 1415  cefoTEtan in Dextrose 5% (CEFOTAN) IVPB 2 g  Status:  Discontinued     2 g 100 mL/hr over 30 Minutes Intravenous Every 12 hours 06/07/18 1408 06/07/18 1409   06/07/18 1415  cefoTEtan (CEFOTAN) 2 g in sodium chloride 0.9 % 100 mL IVPB  Status:  Discontinued     2 g 200 mL/hr over 30 Minutes Intravenous To ShortStay Surgical 06/07/18 1410 06/07/18 1851   06/07/18 1400  cefoTEtan in Dextrose 5% (CEFOTAN) IVPB 2 g  Status:  Discontinued     2 g 100 mL/hr over 30 Minutes Intravenous To ShortStay Surgical 06/07/18 1359 06/07/18 1409   06/07/18 0600  cefoTEtan in Dextrose 5% (CEFOTAN) IVPB 2 g  Status:  Discontinued     2 g 100 mL/hr over 30 Minutes Intravenous On call to O.R. 06/06/18 2009 06/07/18 1540   06/03/18 0900  ciprofloxacin (CIPRO) IVPB 400 mg  Status:  Discontinued     400 mg 200 mL/hr over 60 Minutes Intravenous Every 12 hours 06/03/18 0757 06/03/18  4734   06/03/18 0800  cefTRIAXone (ROCEPHIN) 1 g in sodium chloride 0.9 % 100 mL IVPB  Status:  Discontinued     1 g 200 mL/hr over 30 Minutes Intravenous Every 24 hours 06/03/18 0759 06/06/18 1135       Assessment/Plan Chest pain - resolved  UTI - per primary team, treated with Rocephin  Urinary retention  HTN GERD Diastolic CHF - stable  Nausea and vomiting Type III hiatal hernia S/P LAPAROSCOPIC REPAIR OF HH, LAPAROSCOPIC 68F GASTROSTOMY- 06/07/2018 - D. Newman - POD#8, afebrile, VSS -+ bowel function.  but likely has a gastric ileus and she is having bowel function, but remains nauseated with emesis on clear liquids.  Return g-tube to gravity.  Will discuss possibility of converting g-tube to [place] tube to initiate tube feeds and get her off TNA.             -  Already on reglan 5mg  q 8 hrs.  Would  like to stop this ASAP - Minimize narcotics as able,scheduled p.o. Tylenol - OOB/mobilize,continuePT/OT- currently recommending SNF - Incentive spirometry  FEN: G tube gravity, may have ice and clears from floor for comfort, on TNA ID: Rocephin 7/5-7/8, perioperative cefotetan 7/9  VTE: SCD's,subcutaneous heparin Foley: in place x5days,removed 7/11.purewick in place currently. Follow up - [facility] Dis[facility] - plan for SNF once tolerating a diet.     LOS: 11 days    Henreitta Cea , [facility] Surgery 06/15/2018, 8:33 AM Pager: [phone]

## 2018-06-16 LAB — COMPREHENSIVE METABOLIC PANEL
ALBUMIN: 2.3 g/dL — AB (ref 3.5–5.0)
ALT: 26 U/L (ref 0–44)
AST: 21 U/L (ref 15–41)
Alkaline Phosphatase: 94 U/L (ref 38–126)
Anion gap: 4 — ABNORMAL LOW (ref 5–15)
BILIRUBIN TOTAL: 0.3 mg/dL (ref 0.3–1.2)
BUN: 19 mg/dL (ref 8–23)
CO2: 28 mmol/L (ref 22–32)
CREATININE: 0.48 mg/dL (ref 0.44–1.00)
Calcium: 8.4 mg/dL — ABNORMAL LOW (ref 8.9–10.3)
Chloride: 102 mmol/L (ref 98–111)
GFR calc Af Amer: >60 mL/min (ref >60)
GFR calc non Af Amer: >60 mL/min (ref >60)
Glucose, Bld: 106 mg/dL — ABNORMAL HIGH (ref 70–99)
POTASSIUM: 4 mmol/L (ref 3.5–5.1)
Sodium: 134 mmol/L — ABNORMAL LOW (ref 135–145)
TOTAL PROTEIN: 4.8 g/dL — AB (ref 6.5–8.1)

## 2018-06-16 LAB — PHOSPHORUS: Phosphorus: 3.8 mg/dL (ref 2.5–4.6)

## 2018-06-16 LAB — MAGNESIUM: Magnesium: 1.7 mg/dL (ref 1.7–2.4)

## 2018-06-16 MED ORDER — TRAZODONE HCL 50 MG PO TABS
50.0000 mg | ORAL_TABLET | Freq: Once | ORAL | Status: AC
  Administered 2018-06-16: 50 mg via ORAL
  Filled 2018-06-16: qty 1

## 2018-06-16 MED ORDER — TRAVASOL 10 % IV SOLN
INTRAVENOUS | Status: AC
  Administered 2018-06-16: 18:00:00 via INTRAVENOUS
  Filled 2018-06-16: qty 889.2

## 2018-06-16 NOTE — Progress Notes (Signed)
Patient ID: [name], female   DOB: [birth year], 84 y.o.   MRN: [id number]    9 Days Post-Op  Subjective: Pt very sleepy this morning.  Feels better with g-tube to gravity although not much output documented.  Objective: Vital signs in last 24 hours: Temp:  [97.6 F (36.4 C)-98.5 F (36.9 C)] 97.6 F (36.4 C) (07/18 0610) Pulse Rate:  [79-83] 80 (07/18 0610) Resp:  [14-18] 14 (07/17 1956) BP: (93-125)/(47-65) 125/63 (07/18 0610) SpO2:  [93 %-99 %] 99 % (07/18 0610) Last BM Date: 06/14/18  Intake/Output from previous day: 07/17 0701 - 07/18 0700 In: 2208.6 [P.O.:150; I.V.:2028.6] Out: 2050 [OYDXA:1287; Drains:100] Intake/Output this shift: No intake/output data recorded.  PE: Gen: NAD Heart: regular Lungs: CTAB Abd: soft, +BS, g-tube in place and site is clean.  Gravity bag with mostly watered down brownish output.  Sites are well-healed  Lab Results:  No results for input(s): WBC, HGB, HCT, PLT in the last 72 hours. BMET Recent Labs    06/14/18 0553 06/16/18 0610  NA 132* 134*  K 4.1 4.0  CL 98 102  CO2 29 28  GLUCOSE 110* 106*  BUN 21 19  CREATININE 0.51 0.48  CALCIUM 8.1* 8.4*   PT/INR No results for input(s): LABPROT, INR in the last 72 hours. CMP     Component Value Date/Time   NA 134 (L) 06/16/2018 0610   K 4.0 06/16/2018 0610   CL 102 06/16/2018 0610   CO2 28 06/16/2018 0610   GLUCOSE 106 (H) 06/16/2018 0610   BUN 19 06/16/2018 0610   CREATININE 0.48 06/16/2018 0610   CALCIUM 8.4 (L) 06/16/2018 0610   PROT 4.8 (L) 06/16/2018 0610   ALBUMIN 2.3 (L) 06/16/2018 0610   AST 21 06/16/2018 0610   ALT 26 06/16/2018 0610   ALKPHOS 94 06/16/2018 0610   BILITOT 0.3 06/16/2018 0610   GFRNONAA >60 06/16/2018 0610   GFRAA >60 06/16/2018 0610   Lipase     Component Value Date/Time   LIPASE 22 06/02/2018 2053       Studies/Results: No results found.  Anti-infectives: Anti-infectives (From admission, onward)   Start     Dose/Rate Route  Frequency Ordered Stop   06/07/18 1415  cefoTEtan (CEFOTAN) 1 g in sodium chloride 0.9 % 100 mL IVPB  Status:  Discontinued     1 g 200 mL/hr over 30 Minutes Intravenous To ShortStay Surgical 06/07/18 1405 06/07/18 1409   06/07/18 1415  cefoTEtan in Dextrose 5% (CEFOTAN) IVPB 2 g  Status:  Discontinued     2 g 100 mL/hr over 30 Minutes Intravenous Every 12 hours 06/07/18 1408 06/07/18 1409   06/07/18 1415  cefoTEtan (CEFOTAN) 2 g in sodium chloride 0.9 % 100 mL IVPB  Status:  Discontinued     2 g 200 mL/hr over 30 Minutes Intravenous To ShortStay Surgical 06/07/18 1410 06/07/18 1851   06/07/18 1400  cefoTEtan in Dextrose 5% (CEFOTAN) IVPB 2 g  Status:  Discontinued     2 g 100 mL/hr over 30 Minutes Intravenous To ShortStay Surgical 06/07/18 1359 06/07/18 1409   06/07/18 0600  cefoTEtan in Dextrose 5% (CEFOTAN) IVPB 2 g  Status:  Discontinued     2 g 100 mL/hr over 30 Minutes Intravenous On call to O.R. 06/06/18 2009 06/07/18 1540   06/03/18 0900  ciprofloxacin (CIPRO) IVPB 400 mg  Status:  Discontinued     400 mg 200 mL/hr over 60 Minutes Intravenous Every 12 hours 06/03/18 0757 06/03/18 0759  06/03/18 0800  cefTRIAXone (ROCEPHIN) 1 g in sodium chloride 0.9 % 100 mL IVPB  Status:  Discontinued     1 g 200 mL/hr over 30 Minutes Intravenous Every 24 hours 06/03/18 0759 06/06/18 1135       Assessment/Plan Chest pain - resolved  UTI - per primary team, treated with Rocephin  Urinary retention  HTN GERD Diastolic CHF- stable  Nausea and vomiting Type III hiatal hernia S/P LAPAROSCOPIC REPAIR OF HH, LAPAROSCOPIC 23F GASTROSTOMY- 06/07/2018 - D. Newman - POD#9, afebrile, VSS -+ bowel function. but likely has a gastric ileus and she is having bowel function, but remains nauseated with emesis on clear liquids.  will try clamping her tube again today and not give her clears and see how she does.  If she gets nauseated again will return her to gravity  bag. - Already on reglan 5mg  q 8 hrs. Would like to stop this ASAP in a 84 yo! - Minimize narcotics as able,scheduled p.o. Tylenol - OOB/mobilize,continuePT/OT- currently recommending SNF - Incentive spirometry  FEN: G tube clamping today but return to gravity if develops nausea, on TNA ID: Rocephin 7/5-7/8, perioperative cefotetan 7/9  VTE: SCD's,subcutaneous heparin Foley: in place x5days,removed 7/11.purewick in place currently. Follow up - [facility] - will have IR review her case today.  Would like to convert G-tube to [place] tube tomorrow (Friday) if patient does not tolerate repeating clamping trials today.  Suspect she has a gastric ileus, despite her small and large bowel working well.  Unfortunately this can happen after gastric surgery.  She is on low dose reglan that the hospitalist put her on a while ago.  Would like to stop this as it has significant side effects that are possible, especially in a 84 yo.  She does have 3 sutures around her g-tube to her abdominal wall so her stomach is pexied well to her abdominal wall.   LOS: 12 days    Henreitta Cea , [facility] 06/16/2018, 7:41 AM Pager: [phone]

## 2018-06-16 NOTE — Progress Notes (Signed)
Per md tube to clamped will continue to monitor

## 2018-06-16 NOTE — Progress Notes (Signed)
PHARMACY - ADULT TOTAL PARENTERAL NUTRITION CONSULT NOTE   Pharmacy Consult:  TPN Indication:  Obstruction from hiatal hernia  Patient Measurements: Height: 5\' 1"  (154.9 cm) Weight: 158 lb 11.7 oz (72 kg) IBW/kg (Calculated) : 47.8 TPN AdjBW (KG): 53.1 Body mass index is 29.99 kg/m. Usual Weight: 68-70 kg  Assessment:  [name] presented from SNF on 06/02/18 with intractable nausea, vomiting, and abdominal/chest pain.  Patient has a history of a large hiatal hernia but is not a good surgical candidate.  She started to have coffee-ground emesis 06/04/18 and EGD showed no evidence of ulcer or active bleeding.    Patient denies recent weight loss and is at her usual weight.  She was eating 1/3 - 1/2 of her meal trays PTA.  She states that feeding tube is uncomfortable but she will give it a try if needed to maintain her nutrition.  Spoke to [facility], placing a post-pyloric feeding tube is not feasible d/t anatomy/hernia.  Even with surgery, patient will not be able to have adequate nutrition anytime soon.  Pharmacy consulted to manage TPN.  GI: hx GERD/hiatal hernia.  S/p hernia repair with Gtube placement 7/9. Increased N/V on 7/14 with imaging showing adynamic ileus. Clamping G tube today with plans to return to gravity if nausea develops. On ice and clears for comfort. IR to review today for possible GJ tube tomorrow. BL prealbumin 14.5 >> up to 21.6 (7/15), LBM 7/15 - Reglan, PPI IV, PRN Zofran.  . Endo: no hx DM - CBGs well controlled Insulin requirements in the past 24 hours: 2 units sSSI Lytes: Na 132, Mg 1.8, all others wnl on 7/15 Renal: SCr 0.48 stable, BUN WNL - UOP 1.1 ml/kg/hr, NS at 10/hr, LR at 10/hr Pulm: stable on RA Cards: HTN/HLD/CHF - VSS - PRN hydralazine Hepatobil: LFTs / tbili / TG WNL Neuro: pain score 0, on scheduled APAP ID: s/p 4d CTX for E.coli UTI, CoNS in BCx (?contaminant) - afebrile, WBC 10.7 TPN Access: PICC placed 06/05/18 TPN start date: 06/06/18  Nutritional Goals  (RD rec on 7/16): 1500-1700 kCal and 85-100gm protein per day  Current Nutrition:  TPN  Plan:  Continue TPN at 65 ml/hr for adjusted RD goals - will provide 89g AA, 265g CHO and 39g ILE for a total of 1647 kCal, meeting 100% of patient's needs. Electrolytes in TPN: No changes, Cl:Ac 2:1 Daily multivitamin and trace elements in TPN F/u TPN labs on Thursday IR to evaluate today for possible GJ tube placement tomorrow   Thank you for allowing pharmacy to be a part of this patient's care.  Albertina Parr, PharmD., BCPS Clinical Pharmacist Clinical phone for 06/16/18 until 3:30pm: [phone] If after 3:30pm, please refer to [facility] for unit-specific pharmacist

## 2018-06-17 ENCOUNTER — Inpatient Hospital Stay (HOSPITAL_COMMUNITY): Payer: Medicare Other

## 2018-06-17 MED ORDER — HEPARIN SODIUM (PORCINE) 5000 UNIT/ML IJ SOLN
5000.0000 [IU] | Freq: Three times a day (TID) | INTRAMUSCULAR | Status: DC
  Administered 2018-06-18 – 2018-06-24 (×18): 5000 [IU] via SUBCUTANEOUS
  Filled 2018-06-17 (×18): qty 1

## 2018-06-17 MED ORDER — TRAVASOL 10 % IV SOLN
INTRAVENOUS | Status: AC
  Administered 2018-06-17: 17:00:00 via INTRAVENOUS
  Filled 2018-06-17: qty 889.2

## 2018-06-17 MED ORDER — ZOLPIDEM TARTRATE 5 MG PO TABS
5.0000 mg | ORAL_TABLET | Freq: Every evening | ORAL | Status: DC | PRN
  Administered 2018-06-17 – 2018-06-23 (×7): 5 mg via ORAL
  Filled 2018-06-17 (×7): qty 1

## 2018-06-17 NOTE — Progress Notes (Signed)
PHARMACY - ADULT TOTAL PARENTERAL NUTRITION CONSULT NOTE   Pharmacy Consult:  TPN Indication:  Obstruction from hiatal hernia  Patient Measurements: Height: 5\' 1"  (154.9 cm) Weight: 158 lb 11.7 oz (72 kg) IBW/kg (Calculated) : 47.8 TPN AdjBW (KG): 53.1 Body mass index is 29.99 kg/m. Usual Weight: 68-70 kg  Assessment:  [name] presented from SNF on 06/02/18 with intractable nausea, vomiting, and abdominal/chest pain.  Patient has a history of a large hiatal hernia but is not a good surgical candidate.  She started to have coffee-ground emesis 06/04/18 and EGD showed no evidence of ulcer or active bleeding.    Patient denies recent weight loss and is at her usual weight.  She was eating 1/3 - 1/2 of her meal trays PTA.  She states that feeding tube is uncomfortable but she will give it a try if needed to maintain her nutrition.  Spoke to [facility], placing a post-pyloric feeding tube is not feasible d/t anatomy/hernia.  Even with surgery, patient will not be able to have adequate nutrition anytime soon.  Pharmacy consulted to manage TPN.  GI: hx GERD/hiatal hernia.  S/p hernia repair with Gtube placement 7/9. Increased N/V on 7/14 with imaging showing adynamic ileus. Clamped G tube on 7/18, then patient became nauseous around 0130 on 7/19 so tube was reconnected back to gravity drainage. Output back up to 545mL. On ice and clears for comfort. IR to review for possible GJ tube. BL prealbumin 14.5 >> up to 21.6 (7/15), LBM 7/15 - Reglan, PPI IV, PRN Zofran. Endo: no hx DM - AM glucose ok. Insulin requirements in the past 24 hours: 0 units Lytes: wnl. Last Mg was 1.7.  Renal: SCr stable, BUN wnl - UOP 0.8 ml/kg/hr, NS at 10/hr, LR at 10/hr Pulm: stable on RA Cards: HTN/HLD/CHF - VSS - PRN hydralazine Hepatobil: LFTs / tbili / TG WNL Neuro: pain score 0, on scheduled APAP ID: S/p 4d CTX for E.coli UTI. Afebrile, WBC 10.7 TPN Access: PICC placed 06/05/18 TPN start date: 06/06/18  Nutritional Goals (RD  rec on 7/16): 1500-1700 kCal 85-100gm protein per day Fluid: 1.5-1.7 L  Current Nutrition:  Clear liquid TPN  Plan:  Continue TPN at 65 ml/hr This TPN provides 89 g of protein, 265 g of dextrose, and 39 g of lipids for a total of 1,647 kcals meeting 100% of patient needs Electrolytes in TPN: continue Na 189mEq/L, increase Mg to 73mEq/L; Cl:Ac 2:1 Add MVI and trace elements to TPN Continue LR and NS at 35ml/hr per MD Monitor TPN labs  F/U GJ tube placement  Thank you for allowing pharmacy to be a part of this patient's care.  Elenor Quinones, PharmD, BCPS Clinical Pharmacist Phone number [phone] 06/17/2018 7:49 AM

## 2018-06-17 NOTE — Progress Notes (Signed)
Patient ID: [name], female   DOB: May 15, 1926, 84 y.o.   MRN: [id number]    10 Days Post-Op  Subjective: Patient did not tolerate g-tube being clamped.  g-tube returned to gravity.  Objective: Vital signs in last 24 hours: Temp:  [97.7 F (36.5 C)-98.3 F (36.8 C)] 97.7 F (36.5 C) (07/19 0442) Pulse Rate:  [72-84] 84 (07/19 0442) Resp:  [18-19] 18 (07/19 0442) BP: (115-132)/(58-65) 132/58 (07/19 0442) SpO2:  [94 %-99 %] 95 % (07/19 0442) Last BM Date: 06/14/18  Intake/Output from previous day: 07/18 0701 - 07/19 0700 In: 1992.7 [I.V.:1962.7] Out: 1900 [Urine:1350; Drains:550] Intake/Output this shift: No intake/output data recorded.  PE: Abd: soft, NT, ND, +BS, g-tube in place and site is c/d/i  Lab Results:  No results for input(s): WBC, HGB, HCT, PLT in the last 72 hours. BMET Recent Labs    06/16/18 0610  NA 134*  K 4.0  CL 102  CO2 28  GLUCOSE 106*  BUN 19  CREATININE 0.48  CALCIUM 8.4*   PT/INR No results for input(s): LABPROT, INR in the last 72 hours. CMP     Component Value Date/Time   NA 134 (L) 06/16/2018 0610   K 4.0 06/16/2018 0610   CL 102 06/16/2018 0610   CO2 28 06/16/2018 0610   GLUCOSE 106 (H) 06/16/2018 0610   BUN 19 06/16/2018 0610   CREATININE 0.48 06/16/2018 0610   CALCIUM 8.4 (L) 06/16/2018 0610   PROT 4.8 (L) 06/16/2018 0610   ALBUMIN 2.3 (L) 06/16/2018 0610   AST 21 06/16/2018 0610   ALT 26 06/16/2018 0610   ALKPHOS 94 06/16/2018 0610   BILITOT 0.3 06/16/2018 0610   GFRNONAA >60 06/16/2018 0610   GFRAA >60 06/16/2018 0610   Lipase     Component Value Date/Time   LIPASE 22 06/02/2018 2053       Studies/Results: No results found.  Anti-infectives: Anti-infectives (From admission, onward)   Start     Dose/Rate Route Frequency Ordered Stop   06/07/18 1415  cefoTEtan (CEFOTAN) 1 g in sodium chloride 0.9 % 100 mL IVPB  Status:  Discontinued     1 g 200 mL/hr over 30 Minutes Intravenous To ShortStay Surgical  06/07/18 1405 06/07/18 1409   06/07/18 1415  cefoTEtan in Dextrose 5% (CEFOTAN) IVPB 2 g  Status:  Discontinued     2 g 100 mL/hr over 30 Minutes Intravenous Every 12 hours 06/07/18 1408 06/07/18 1409   06/07/18 1415  cefoTEtan (CEFOTAN) 2 g in sodium chloride 0.9 % 100 mL IVPB  Status:  Discontinued     2 g 200 mL/hr over 30 Minutes Intravenous To ShortStay Surgical 06/07/18 1410 06/07/18 1851   06/07/18 1400  cefoTEtan in Dextrose 5% (CEFOTAN) IVPB 2 g  Status:  Discontinued     2 g 100 mL/hr over 30 Minutes Intravenous To ShortStay Surgical 06/07/18 1359 06/07/18 1409   06/07/18 0600  cefoTEtan in Dextrose 5% (CEFOTAN) IVPB 2 g  Status:  Discontinued     2 g 100 mL/hr over 30 Minutes Intravenous On call to O.R. 06/06/18 2009 06/07/18 1540   06/03/18 0900  ciprofloxacin (CIPRO) IVPB 400 mg  Status:  Discontinued     400 mg 200 mL/hr over 60 Minutes Intravenous Every 12 hours 06/03/18 0757 06/03/18 0759   06/03/18 0800  cefTRIAXone (ROCEPHIN) 1 g in sodium chloride 0.9 % 100 mL IVPB  Status:  Discontinued     1 g 200 mL/hr over 30 Minutes Intravenous  Every 24 hours 06/03/18 0759 06/06/18 1135       Assessment/Plan Chest pain - resolved  UTI - per primary team, treated with Rocephin  Urinary retention  HTN GERD Diastolic CHF- stable  Nausea and vomiting Type III hiatal hernia S/P LAPAROSCOPIC REPAIR OF HH, LAPAROSCOPIC 21F GASTROSTOMY- 06/07/2018 - D. Newman - POD#10, afebrile, VSS -+ bowel function. but likely has a gastric ileus.  IR to try conversion of g-tube to [place] tube today if able. - Already on reglan 5mg  q 8 hrs. Would like to stop this ASAP in a 84 yo! - Minimize narcotics as able,scheduled p.o. Tylenol - OOB/mobilize,continuePT/OT- currently recommending SNF - Incentive spirometry  FEN: G tube conversion today to [place], on TNA ID: Rocephin 7/5-7/8, perioperative cefotetan 7/9    VTE: SCD's,subcutaneous heparin Foley: in place x5days,removed 7/11.purewick in place currently. Follow up - [place] for g-tube conversion today to [place] tube.  Then plan for tube feeds through J over the weekend.  If tolerates these then hopefully plan to DC early next week back to [facility].  Spoke at length and updated daughter, Tonia Ghent, today over the phone.  She is agreeable for tube conversion and grateful for the update.   LOS: 13 days    Henreitta Cea , [facility] 06/17/2018, 7:54 AM Pager: [phone]

## 2018-06-17 NOTE — Progress Notes (Signed)
Patient ID: [name], female   DOB: [birth year], 84 y.o.   MRN: [id number]   Secondary to emergencies in IR G to [place] will be rescheduled to 7/22 Mon per Dr Annamaria Boots

## 2018-06-17 NOTE — Progress Notes (Signed)
Nutrition Follow Up  DOCUMENTATION CODES:   Not applicable  INTERVENTION:    Once G-J tube is ready to be used, recommend:  Start Osmolite 1.2 at 25 ml/hr and increase by 10 ml every 6 hours to goal rate of 55 ml/hr  Pro-stat liquid protein 30 ml daily  Provides 1684 kcals, 88 gm protein, 1082 ml of free water   NUTRITION DIAGNOSIS:   Inadequate oral intake related to altered GI function as evidenced by (need for TPN support), ongoing  GOAL:   Patient will meet greater than or equal to 90% of their needs, met  MONITOR:   TF tolerance, Labs, Skin, Weight trends, I & O's  ASSESSMENT:   Patient has a PMH of HTN, HLD, GERD, mitral valve regurgitation, CHF, hiatal hernia, who presents with intractable nausea, vomiting, abdominal and chest pain likely related to hiatal hernia that contains most of her stomach in the sac.She vomited all day 7/5, had coffee ground emesis 7/6 with NGT inserted to suction which led to an improvement in her symptoms. EGD done 7/7 that found type III hiatal hernia with inflammation and erosion but no evidence of ulcer or active bleeding. Started on TPN on 7/8 due to inability to take PO. Surgical hiatal hernia repair and gastrostomy tube placement are likely next steps being discussed with the family at this point.   7/08 TPN started 7/09 hiatal hernia repair, #20 gastrostomy  7/14 ABG X-ray showed ileus  Pt continues with PO diet intolerance. + N/V. On and off liquids. Plan is for IR to convert her G-tube to G-J tube with transition to EN support. Recommend TF regimen utilizing standard vs speciality formula as dispoisition is SNF. Labs and medications reviewed. Na 134 (L).  TPN prescription at 65 ml/hr. Providing 1647 kcals, 89 gm protein. Meeting 100% of estimated nutrition needs. Receiving daily MVI in TPN.  Diet Order:   Diet Order           Diet NPO time specified Except for: Ice Chips, Other (See Comments)  Diet effective now          EDUCATION NEEDS:   No education needs have been identified at this time  Skin:  Skin Assessment: Reviewed RN Assessment  Last BM:  7/16   Height:   Ht Readings from Last 1 Encounters:  06/03/18 5' 1"  (1.549 m)   Weight:   Wt Readings from Last 1 Encounters:  06/14/18 158 lb 11.7 oz (72 kg)   Ideal Body Weight:  47.72 kg  BMI:  Body mass index is 29.99 kg/m.  Estimated Nutritional Needs:   Kcal:  1500-1700  Protein:  85-100 gm  Fluid:  1.5-1.7 L  [name], RD, LDN Pager #: [phone] After-Hours Pager #: [phone]

## 2018-06-17 NOTE — Progress Notes (Signed)
Henreitta Cea , PA-C notified of cancellation of GJ tube today d/t emergency. Will try again tomorrow per radiology. Per [name], keep G tube to gravity.   Emelda Fear, RN

## 2018-06-17 NOTE — Progress Notes (Addendum)
Patient tolerated having G tube clamped from 2000 until around [place]. Patient became nauseous. Tube was connected back to gravity drainage.

## 2018-06-17 NOTE — Progress Notes (Signed)
Spoke with Camera operator. Unable to do G tube conversion due to emergency.

## 2018-06-17 NOTE — Consult Note (Signed)
Chief Complaint: Patient was seen in consultation today for conversion of Gastric tube to gastro-jejunal tube Chief Complaint  Patient presents with  . Chest Pain  . Abdominal Pain   at the request of Dr Ok Anis  Supervising Physician: Daryll Brod  Patient Status: [facility] - In-pt  History of Present Illness: [name] is a 84 y.o. female   Hiatal hernia repair 06/07/2017 With placement of gastrostomy tube in OR N/V; not tolerating feeds Gastric ileus  Pt has had 2 trials of G tube clamping and still intolerant Now for conversion of surgical G tube to Gastro-Jejunal tube placement in IR  Past Medical History:  Diagnosis Date  . Actinic keratosis   . Allergic rhinitis   . Back pain   . Chronic constipation   . Chronic diastolic CHF (congestive heart failure) ([place]) 06/03/2018  . GERD (gastroesophageal reflux disease)   . Hyperlipidemia   . Hypertension   . Hyponatremia   . Leg edema   . Macular degeneration   . Mitral regurgitation    mild by echo 2003  . Osteoarthritis   . Osteoarthrosis   . Osteoporosis   . Tenosynovitis    myxomatous    Past Surgical History:  Procedure Laterality Date  . CARPAL TUNNEL RELEASE     bilateral  . ESOPHAGOGASTRODUODENOSCOPY (EGD) WITH PROPOFOL N/A 06/05/2018   Procedure: ESOPHAGOGASTRODUODENOSCOPY (EGD) WITH PROPOFOL;  Surgeon: Otis Brace, MD;  Location: [place];  Service: Gastroenterology;  Laterality: N/A;  . hernial repain    . HIATAL HERNIA REPAIR N/A 06/07/2018   Procedure: LAPAROSCOPIC REPAIR OF HIATAL HERNIA;  Surgeon: Alphonsa Overall, MD;  Location: [place];  Service: General;  Laterality: N/A;  . INGUINAL HERNIA REPAIR     laparoscopic preperitoneal repair of bilateral inguinal hernias  . LAPAROSCOPIC GASTROSTOMY N/A 06/07/2018   Procedure: LAPAROSCOPIC GASTROSTOMY;  Surgeon: Alphonsa Overall, MD;  Location: [place];  Service: General;  Laterality: N/A;  . REPLACEMENT TOTAL KNEE BILATERAL    . REVERSE SHOULDER  ARTHROPLASTY Right 05/08/2014   Procedure: REVERSE SHOULDER ARTHROPLASTY RIGHT ;  Surgeon: Marin Shutter, MD;  Location: [place];  Service: Orthopedics;  Laterality: Right;  . SYNOVECTOMY     of right long finger  . tonisellectomy    . tonisillectomy    . TONSILLECTOMY      Allergies: Aspirin; Boniva [ibandronic acid]; Morphine; Nitrofuran derivatives; Penicillins; and Sulfonamide derivatives  Medications: Prior to Admission medications   Medication Sig Start Date End Date Taking? Authorizing Provider  Calcium Carbonate-Vitamin D3 (CALCIUM 600+D3) 600-400 MG-UNIT TABS Take 1 tablet by mouth daily.   Yes [provider]  cetirizine (ZYRTEC) 10 MG tablet Take 10 mg by mouth daily.    Yes [provider]  docusate sodium (COLACE) 100 MG capsule Take 100 mg by mouth 2 (two) times daily.   Yes [provider]  estradiol (ESTRACE) 0.1 MG/GM vaginal cream Place 1 Applicatorful vaginally 2 (two) times a week. Mondays and Thursdays   Yes [provider]  gabapentin (NEURONTIN) 100 MG capsule Take 100 mg by mouth at bedtime.   Yes [provider]  irbesartan (AVAPRO) 75 MG tablet Take 1 tablet (75 mg total) by mouth daily. 05/10/14  Yes Tat, Shanon Brow, MD  Multiple Vitamins-Minerals (CENTRUM SILVER ULTRA WOMENS PO) Take 1 tablet by mouth daily.   Yes [provider]  Multiple Vitamins-Minerals (PRESERVISION AREDS 2 PO) Take 2 tablets by mouth daily.    Yes [provider]  omeprazole (  PRILOSEC) 20 MG capsule Take 20 mg by mouth daily.   Yes [provider]  ondansetron (ZOFRAN) 4 MG tablet Take 4 mg by mouth every 6 (six) hours as needed for nausea or vomiting.   Yes [provider]  oxyCODONE (OXY IR/ROXICODONE) 5 MG immediate release tablet Take 5 mg by mouth every 8 (eight) hours as needed for severe pain.   Yes [provider]  Probiotic Product (PROBIOTIC DAILY) CAPS Take 250 mg by mouth daily.    Yes [provider]  spironolactone (ALDACTONE) 25 MG tablet Take 25 mg by mouth daily.    Yes [provider]  trimethoprim (TRIMPEX) 100 MG tablet Take 100 mg by mouth daily. UTI prophylaxis.   Yes [provider]     Family History  Problem Relation Age of Onset  . Heart attack Father   . Heart attack Brother     Social History   Socioeconomic History  . Marital status: Divorced    Spouse name: Not on file  . Number of children: 4  . Years of education: Not on file  . Highest education level: Not on file  Occupational History  . Not on file  Social Needs  . Financial resource strain: Not on file  . Food insecurity:    Worry: Not on file    Inability: Not on file  . Transportation needs:    Medical: Not on file    Non-medical: Not on file  Tobacco Use  . Smoking status: Former Smoker    Packs/day: 1.00    Years: 2.00    Pack years: 2.00    Types: Cigarettes    Last attempt to quit: 12/07/1967    Years since quitting: 50.5  . Smokeless tobacco: Never Used  Substance and Sexual Activity  . Alcohol use: Yes    Alcohol/week: 1.2 oz    Types: 2 Glasses of wine per week    Comment: 2 glasses per week  . Drug use: No  . Sexual activity: Never  Lifestyle  . Physical activity:    Days per week: Not on file    Minutes per session: Not on file  . Stress: Not on file  Relationships  . Social connections:    Talks on phone: Not on file    Gets together: Not on file    Attends religious service: Not on file    Active member of club or organization: Not on file    Attends meetings of clubs or organizations: Not on file    Relationship status: Not on file  Other Topics Concern  . Not on file  Social History Narrative  . Not on file    Review of Systems: A 12 point ROS discussed and pertinent positives are indicated in the HPI above.  All other systems are negative.  Review of Systems  Constitutional: Positive for activity change, appetite change and  fatigue. Negative for fever.  Respiratory: Negative for shortness of breath.   Cardiovascular: Negative for chest pain.  Gastrointestinal: Positive for abdominal pain, nausea and vomiting.  Neurological: Positive for weakness.  Psychiatric/Behavioral: Negative for behavioral problems and confusion.    Vital Signs: BP (!) 132/58 (BP Location: Left Arm)   Pulse 84   Temp 97.7 F (36.5 C) (Oral)   Resp 18   Ht 5\' 1"  (1.549 m)   Wt 158 lb 11.7 oz (72 kg)   SpO2 95%   BMI 29.99 kg/m   Physical Exam  Constitutional:  She is oriented to person, place, and time.  Cardiovascular: Normal rate and regular rhythm.  Pulmonary/Chest: Effort normal and breath sounds normal.  Abdominal: Soft. Bowel sounds are normal. There is tenderness.  Musculoskeletal: Normal range of motion.  Neurological: She is alert and oriented to person, place, and time.  Skin: Skin is warm and dry.  Psychiatric: She has a normal mood and affect. Her behavior is normal.  Nursing note and vitals reviewed.   Imaging: Dg Chest 2 View  Result Date: 06/02/2018 CLINICAL DATA:  Chest pain and vomiting. EXAM: CHEST - 2 VIEW COMPARISON:  03/19/2018 and chest CT 08/05/2015 FINDINGS: Lungs are somewhat hypoinflated demonstrate mild opacification over the left base which may be due to small amount of pleural fluid/atelectasis. Mild prominence of the perihilar markings suggesting mild degree of vascular congestion. Mild stable cardiomegaly. Stable elevation of the left hemidiaphragm with large hiatal hernia unchanged. Remainder of the exam is unchanged. IMPRESSION: Mild cardiomegaly with suggestion of mild vascular congestion. Mild left base opacification likely small effusion with atelectasis. Large hiatal hernia unchanged. Electronically Signed   By: Marin Olp M.D.   On: 06/02/2018 21:42   Ct Abdomen Pelvis W Contrast  Result Date: 06/02/2018 CLINICAL DATA:  84 year old female with acute abdominal pain and vomiting today. EXAM:  CT ABDOMEN AND PELVIS WITH CONTRAST TECHNIQUE: Multidetector CT imaging of the abdomen and pelvis was performed using the standard protocol following bolus administration of intravenous contrast. CONTRAST:  158mL OMNIPAQUE IOHEXOL 300 MG/ML  SOLN COMPARISON:  05/20/2016 and prior CTs FINDINGS: Lower chest: LEFT basilar atelectasis noted. Hepatobiliary: The liver and gallbladder are unremarkable. Pancreas: Unremarkable Spleen: Unremarkable Adrenals/Urinary Tract: Moderate to marked distention of bladder again noted. Kidneys and adrenal glands are unremarkable. Stomach/Bowel: A large hiatal hernia is noted with a distended stomach in the LOWER LEFT chest/UPPER abdomen-but unchanged in appearance and configuration since 08/05/2015 No dilated small bowel loops identified. No colonic abnormalities are identified. Vascular/Lymphatic: Aortic atherosclerosis. No enlarged abdominal or pelvic lymph nodes. Reproductive: Uterus and bilateral adnexa are unremarkable. Other: No ascites, pneumoperitoneum or abscess. A moderate paraumbilical hernia containing fat is present. Musculoskeletal: No acute abnormalities identified. Lumbar scoliosis and moderate to severe multilevel degenerative changes again noted. LEFT hip arthroplasty again identified. IMPRESSION: 1. Large hiatal hernia with distended stomach in the LOWER LEFT chest/UPPER abdomen with unchanged appearance and configuration of the stomach when compared to 08/05/2015. 2. Distended bladder. 3. Moderate paraumbilical hernia containing fat 4.  Aortic Atherosclerosis (ICD10-I70.0). Electronically Signed   By: Margarette Canada M.D.   On: 06/02/2018 23:28   Dg Abd Portable 1v  Result Date: 06/12/2018 CLINICAL DATA:  Postoperative nausea EXAM: PORTABLE ABDOMEN - 1 VIEW COMPARISON:  06/04/2018 FINDINGS: Progressive bowel gas compared with the prior study. Mildly distended large and small bowel suggestive of ileus. Gastrostomy tube in the left upper quadrant. NG tube is been  removed. Hiatal hernia Lumbar scoliosis and multilevel degenerative change. Left hip replacement IMPRESSION: Adynamic ileus pattern. Electronically Signed   By: Franchot Gallo M.D.   On: 06/12/2018 11:58   Dg Abd Portable 1v  Result Date: 06/04/2018 CLINICAL DATA:  Orogastric tube placement EXAM: PORTABLE ABDOMEN - 1 VIEW COMPARISON:  CT abdomen and pelvis June 02, 2018 FINDINGS: Orogastric tube tip and side port are in the proximal stomach. Note that the left hemidiaphragm is elevated in the stomach is unusually superior in position as is demonstrated on recent CT. There is moderate stool in the colon. There is no bowel dilatation or  air-fluid level to suggest bowel obstruction. No free air. There is degenerative change in the lower thoracic and lumbar spine regions. IMPRESSION: Orogastric tube tip and side port in proximal stomach. Stomach is present inferior to an elevated left hemidiaphragm. No bowel obstruction or free air evident. Moderate stool noted in colon. Electronically Signed   By: Lowella Grip III M.D.   On: 06/04/2018 14:39   [place] Ekg Site Rite  Result Date: 06/05/2018 If Site Rite image not attached, placement could not be confirmed due to current cardiac rhythm.   Labs:  CBC: Recent Labs    06/06/18 0505 06/07/18 0446 06/07/18 1529 06/07/18 1536 06/08/18 0337 06/13/18 0300  WBC 11.8* 10.9*  --   --  17.7* 10.7*  HGB 9.6* 9.7* 7.8* 8.8* 9.9* 8.0*  HCT 30.3* 30.1* 23.0* 26.0* 30.9* 24.8*  PLT 288 298  --   --  317 315    COAGS: Recent Labs    03/20/18 0220 03/21/18 0230 03/22/18 0316 06/04/18 1513  INR 1.12 1.11 1.06 1.21  APTT  --   --   --  29    BMP: Recent Labs    06/12/18 1636 06/13/18 0300 06/14/18 0553 06/16/18 0610  NA 130* 131* 132* 134*  K 4.3 4.2 4.1 4.0  CL 98 98 98 102  CO2 26 27 29 28   GLUCOSE 115* 118* 110* 106*  BUN 21 20 21 19   CALCIUM 8.3* 8.4* 8.1* 8.4*  CREATININE 0.48 0.49 0.51 0.48  GFRNONAA >60 >60 >60 >60  GFRAA >60 >60 >60  >60    LIVER FUNCTION TESTS: Recent Labs    06/07/18 0446 06/09/18 0833 06/13/18 0300 06/16/18 0610  BILITOT 0.7 0.3 0.3 0.3  AST 14* 38 30 21  ALT 12 44 31 26  ALKPHOS 60 57 78 94  PROT 5.5* 5.1* 4.9* 4.8*  ALBUMIN 2.8* 2.4* 2.3* 2.3*    TUMOR MARKERS: No results for input(s): AFPTM, CEA, CA199, CHROMGRNA in the last 8760 hours.  Assessment and Plan:  Hiatal hernia repair and Gastrostomy in OR 06/07/18 + Gastric ileus Continued N/V with clears---  Now for conversion from G tube to [place] tube in IR Pt and daughter [name] are aware of procedure benefits and risks Including but not limited to infection; bleeding; damage to stomach or surrounding structures Agreeable to proceed Consent signed and in chart  Thank you for this interesting consult.  I greatly enjoyed meeting [name] and look forward to participating in their care.  A copy of this report was sent to the requesting provider on this date.  Electronically Signed: Lavonia Drafts, PA-C 06/17/2018, 9:31 AM   I spent a total of 20 Minutes    in face to face in clinical consultation, greater than 50% of which was counseling/coordinating care for G tube to [place]

## 2018-06-17 NOTE — Clinical Social Work Note (Signed)
CSW continuing to monitor patient's progress. Ms. [name] is from [facility] and will return there at discharge. CSW will continue to follow, provide SW intervention services as needed and facilitate discharge back to [facility] when medically stable.  Dymphna Wadley Givens, MSW, LCSW Licensed Clinical Social Worker [place] [phone]

## 2018-06-18 MED ORDER — TRAVASOL 10 % IV SOLN
INTRAVENOUS | Status: AC
  Administered 2018-06-18: 17:00:00 via INTRAVENOUS
  Filled 2018-06-18: qty 889.2
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
## 2018-06-18 NOTE — Progress Notes (Signed)
PHARMACY - ADULT TOTAL PARENTERAL NUTRITION CONSULT NOTE   Pharmacy Consult:  TPN Indication:  Obstruction from hiatal hernia  Patient Measurements: Height: 5\' 1"  (154.9 cm) Weight: 158 lb 11.7 oz (72 kg) IBW/kg (Calculated) : 47.8 TPN AdjBW (KG): 53.1 Body mass index is 29.99 kg/m. Usual Weight: 68-70 kg  Assessment:  [name] presented from SNF on 06/02/18 with intractable nausea, vomiting, and abdominal/chest pain.  Patient has a history of a large hiatal hernia but is not a good surgical candidate.  She started to have coffee-ground emesis 06/04/18 and EGD showed no evidence of ulcer or active bleeding.    Patient denies recent weight loss and is at her usual weight.  She was eating 1/3 - 1/2 of her meal trays PTA.  She states that feeding tube is uncomfortable but she will give it a try if needed to maintain her nutrition.  Spoke to [facility], placing a post-pyloric feeding tube is not feasible d/t anatomy/hernia.  Even with surgery, patient will not be able to have adequate nutrition anytime soon.  Pharmacy consulted to manage TPN.  GI: hx GERD/hiatal hernia.  S/p hernia repair with Gtube placement 7/9. Increased N/V on 7/14 with imaging showing adynamic ileus. Clamped G tube on 7/18, then patient became nauseous around 0130 on 7/19 so tube was reconnected back to gravity drainage. Output 413mL yesterday. IR to place GJ tube on 7/22 so will not be able to start TFs until then. Plan to vent through G tube and feed through J tube. BL prealbumin 14.5 >> up to 21.6 (7/15), LBM 7/15 - Reglan, PPI IV, PRN Zofran. Endo: no hx DM - AM glucose ok. Insulin requirements in the past 24 hours: 0 units Lytes: wnl. Last Mg was 1.7.  Renal: SCr stable, BUN wnl - UOP 0.8 ml/kg/hr, NS at 10/hr, LR at 10/hr Pulm: stable on RA Cards: HTN/HLD/CHF - VSS - PRN hydralazine Hepatobil: LFTs / tbili / TG WNL Neuro: pain score 0, on scheduled APAP ID: S/p 4d CTX for E.coli UTI. Afebrile, WBC 10.7 TPN Access: PICC  placed 06/05/18 TPN start date: 06/06/18  Nutritional Goals (RD rec on 7/19): 1500-1700 kCal 85-100gm protein per day Fluid: 1.5-1.7 L  Current Nutrition:  NPO TPN  Plan:  Continue TPN at 65 ml/hr This TPN provides 89 g of protein, 265 g of dextrose, and 39 g of lipids for a total of 1,647 kcals meeting 100% of patient needs Electrolytes in TPN: continue Na 122mEq/L, Mg 82mEq/L; Cl:Ac 2:1 Add MVI and trace elements to TPN Continue LR and NS at 64ml/hr per MD Monitor TPN labs  F/U GJ tube placement on 7/22    Elenor Quinones, PharmD, BCPS Clinical Pharmacist Phone number [phone] 06/18/2018 7:15 AM

## 2018-06-19 MED ORDER — TRAVASOL 10 % IV SOLN
INTRAVENOUS | Status: AC
  Administered 2018-06-19: 17:00:00 via INTRAVENOUS
  Filled 2018-06-19: qty 889.2

## 2018-06-19 NOTE — Progress Notes (Signed)
12 Days Post-Op   Subjective/Chief Complaint: No complaints   Objective: Vital signs in last 24 hours: Temp:  [97.9 F (36.6 C)-98.6 F (37 C)] 98.5 F (36.9 C) (07/20 1920) Pulse Rate:  [74-77] 74 (07/20 1701) Resp:  [14-20] 20 (07/20 1920) BP: (113-123)/(56-60) 123/57 (07/20 1920) SpO2:  [89 %-96 %] 89 % (07/20 1701) Last BM Date: 06/14/18(per pt report)  Intake/Output from previous day: 07/20 0701 - 07/21 0700 In: 1531.5 [P.O.:120; I.V.:1381.5] Out: 450 [Urine:150; Drains:300] Intake/Output this shift: Total I/O In: -  Out: 600 [Drains:600]  General appearance: cooperative Resp: clear to auscultation bilaterally GI: G tube dressing changed, soft, NT  Lab Results:  No results for input(s): WBC, HGB, HCT, PLT in the last 72 hours. BMET No results for input(s): NA, K, CL, CO2, GLUCOSE, BUN, CREATININE, CALCIUM in the last 72 hours. PT/INR No results for input(s): LABPROT, INR in the last 72 hours. ABG No results for input(s): PHART, HCO3 in the last 72 hours.  Invalid input(s): PCO2, PO2  Studies/Results: No results found.  Anti-infectives: Anti-infectives (From admission, onward)   Start     Dose/Rate Route Frequency Ordered Stop   06/07/18 1415  cefoTEtan (CEFOTAN) 1 g in sodium chloride 0.9 % 100 mL IVPB  Status:  Discontinued     1 g 200 mL/hr over 30 Minutes Intravenous To ShortStay Surgical 06/07/18 1405 06/07/18 1409   06/07/18 1415  cefoTEtan in Dextrose 5% (CEFOTAN) IVPB 2 g  Status:  Discontinued     2 g 100 mL/hr over 30 Minutes Intravenous Every 12 hours 06/07/18 1408 06/07/18 1409   06/07/18 1415  cefoTEtan (CEFOTAN) 2 g in sodium chloride 0.9 % 100 mL IVPB  Status:  Discontinued     2 g 200 mL/hr over 30 Minutes Intravenous To ShortStay Surgical 06/07/18 1410 06/07/18 1851   06/07/18 1400  cefoTEtan in Dextrose 5% (CEFOTAN) IVPB 2 g  Status:  Discontinued     2 g 100 mL/hr over 30 Minutes Intravenous To ShortStay Surgical 06/07/18 1359 06/07/18  1409   06/07/18 0600  cefoTEtan in Dextrose 5% (CEFOTAN) IVPB 2 g  Status:  Discontinued     2 g 100 mL/hr over 30 Minutes Intravenous On call to O.R. 06/06/18 2009 06/07/18 1540   06/03/18 0900  ciprofloxacin (CIPRO) IVPB 400 mg  Status:  Discontinued     400 mg 200 mL/hr over 60 Minutes Intravenous Every 12 hours 06/03/18 0757 06/03/18 0759   06/03/18 0800  cefTRIAXone (ROCEPHIN) 1 g in sodium chloride 0.9 % 100 mL IVPB  Status:  Discontinued     1 g 200 mL/hr over 30 Minutes Intravenous Every 24 hours 06/03/18 0759 06/06/18 1135      Assessment/Plan: s/p Procedure(s): LAPAROSCOPIC REPAIR OF HIATAL HERNIA (N/A) LAPAROSCOPIC GASTROSTOMY (N/A) For conversion of G tube to G-J tomorrow by IR  LOS: 15 days    [name] 06/19/2018

## 2018-06-19 NOTE — Progress Notes (Signed)
PHARMACY - ADULT TOTAL PARENTERAL NUTRITION CONSULT NOTE   Pharmacy Consult:  TPN Indication:  Obstruction from hiatal hernia  Patient Measurements: Height: 5\' 1"  (154.9 cm) Weight: 158 lb 11.7 oz (72 kg) IBW/kg (Calculated) : 47.8 TPN AdjBW (KG): 53.1 Body mass index is 29.99 kg/m. Usual Weight: 68-70 kg  Assessment:  [name] presented from SNF on 06/02/18 with intractable nausea, vomiting, and abdominal/chest pain.  Patient has a history of a large hiatal hernia but is not a good surgical candidate.  She started to have coffee-ground emesis 06/04/18 and EGD showed no evidence of ulcer or active bleeding.    Patient denies recent weight loss and is at her usual weight.  She was eating 1/3 - 1/2 of her meal trays PTA.  She states that feeding tube is uncomfortable but she will give it a try if needed to maintain her nutrition.  Spoke to [facility], placing a post-pyloric feeding tube is not feasible d/t anatomy/hernia.  Even with surgery, patient will not be able to have adequate nutrition anytime soon.  Pharmacy consulted to manage TPN.  GI: hx GERD/hiatal hernia.  S/p hernia repair with Gtube placement 7/9. Increased N/V on 7/14 with imaging showing adynamic ileus. Clamped G tube on 7/18, then patient became nauseous around 0130 on 7/19 so tube was reconnected back to gravity drainage. Output 456mL yesterday. IR to place GJ tube on 7/22 so will not be able to start TFs until then. Plan to vent through G tube and feed through J tube. BL prealbumin 14.5 >> up to 21.6 (7/15), LBM 7/15 - Reglan, PPI IV, PRN Zofran. Endo: no hx DM - AM glucose ok. Insulin requirements in the past 24 hours: 0 units Lytes: wnl. Last Mg was 1.7.  Renal: SCr stable, BUN wnl - UOP down significantly, NS at 10/hr, LR at 10/hr Pulm: stable on RA Cards: HTN/HLD/CHF - VSS - PRN hydralazine Hepatobil: LFTs / tbili / TG WNL Neuro: pain score 0, on scheduled APAP ID: S/p 4d CTX for E.coli UTI. Afebrile, WBC ok TPN Access: PICC  placed 06/05/18 TPN start date: 06/06/18  Nutritional Goals (RD rec on 7/19): 1500-1700 kCal 85-100gm protein per day Fluid: 1.5-1.7 L  Current Nutrition:  NPO TPN  Plan:  Continue TPN at 65 ml/hr This TPN provides 89 g of protein, 265 g of dextrose, and 39 g of lipids for a total of 1,647 kcals meeting 100% of patient needs Electrolytes in TPN: continue Na 155mEq/L, Mg 81mEq/L; Cl:Ac 2:1 Add MVI and trace elements to TPN Continue LR and NS at 68ml/hr per MD Monitor TPN labs, SCr F/U GJ tube placement on 7/22    Elenor Quinones, PharmD, BCPS Clinical Pharmacist Phone number [phone] 06/19/2018 7:10 AM

## 2018-06-20 ENCOUNTER — Inpatient Hospital Stay (HOSPITAL_COMMUNITY): Payer: Medicare Other

## 2018-06-20 ENCOUNTER — Encounter (HOSPITAL_COMMUNITY): Payer: Self-pay | Admitting: Diagnostic Radiology

## 2018-06-20 HISTORY — PX: IR GJ TUBE CHANGE: IMG1440

## 2018-06-20 LAB — DIFFERENTIAL
Abs Immature Granulocytes: 0.1 10*3/uL (ref 0.0–0.1)
Basophils Absolute: 0.1 10*3/uL (ref 0.0–0.1)
Basophils Relative: 1 %
EOS ABS: 0.3 10*3/uL (ref 0.0–0.7)
Eosinophils Relative: 5 %
Immature Granulocytes: 1 %
LYMPHS ABS: 1.4 10*3/uL (ref 0.7–4.0)
Lymphocytes Relative: 21 %
MONO ABS: 1 10*3/uL (ref 0.1–1.0)
MONOS PCT: 14 %
Neutro Abs: 4 10*3/uL (ref 1.7–7.7)
Neutrophils Relative %: 58 %

## 2018-06-20 LAB — COMPREHENSIVE METABOLIC PANEL
ALBUMIN: 2.4 g/dL — AB (ref 3.5–5.0)
ALT: 46 U/L — ABNORMAL HIGH (ref 0–44)
ANION GAP: 7 (ref 5–15)
AST: 33 U/L (ref 15–41)
Alkaline Phosphatase: 161 U/L — ABNORMAL HIGH (ref 38–126)
BILIRUBIN TOTAL: 0.5 mg/dL (ref 0.3–1.2)
BUN: 24 mg/dL — AB (ref 8–23)
CO2: 26 mmol/L (ref 22–32)
Calcium: 8.9 mg/dL (ref 8.9–10.3)
Chloride: 102 mmol/L (ref 98–111)
Creatinine, Ser: 0.54 mg/dL (ref 0.44–1.00)
GFR calc Af Amer: >60 mL/min (ref >60)
GLUCOSE: 104 mg/dL — AB (ref 70–99)
Potassium: 4.2 mmol/L (ref 3.5–5.1)
Sodium: 135 mmol/L (ref 135–145)
TOTAL PROTEIN: 5.4 g/dL — AB (ref 6.5–8.1)

## 2018-06-20 LAB — PREALBUMIN: Prealbumin: 25.2 mg/dL (ref 18–38)

## 2018-06-20 LAB — CBC
HCT: 25.2 % — ABNORMAL LOW (ref 36.0–46.0)
Hemoglobin: 8 g/dL — ABNORMAL LOW (ref 12.0–15.0)
MCH: 29.2 pg (ref 26.0–34.0)
MCHC: 31.7 g/dL (ref 30.0–36.0)
MCV: 92 fL (ref 78.0–100.0)
PLATELETS: 487 10*3/uL — AB (ref 150–400)
RBC: 2.74 MIL/uL — ABNORMAL LOW (ref 3.87–5.11)
RDW: 14 % (ref 11.5–15.5)
WBC: 6.8 10*3/uL (ref 4.0–10.5)

## 2018-06-20 LAB — TRIGLYCERIDES: Triglycerides: 79 mg/dL (ref <150)

## 2018-06-20 LAB — MAGNESIUM: MAGNESIUM: 1.8 mg/dL (ref 1.7–2.4)

## 2018-06-20 LAB — PHOSPHORUS: Phosphorus: 4.3 mg/dL (ref 2.5–4.6)

## 2018-06-20 MED ORDER — IOPAMIDOL (ISOVUE-300) INJECTION 61%
INTRAVENOUS | Status: AC
  Administered 2018-06-20: 50 mL
  Filled 2018-06-20: qty 50

## 2018-06-20 MED ORDER — TRAVASOL 10 % IV SOLN
INTRAVENOUS | Status: AC
  Administered 2018-06-20: 17:00:00 via INTRAVENOUS
  Filled 2018-06-20: qty 889.2

## 2018-06-20 MED ORDER — LIDOCAINE VISCOUS HCL 2 % MT SOLN
OROMUCOSAL | Status: DC | PRN
  Administered 2018-06-20: 5 mL via OROMUCOSAL

## 2018-06-20 MED ORDER — LIDOCAINE VISCOUS HCL 2 % MT SOLN
OROMUCOSAL | Status: AC
  Filled 2018-06-20: qty 15

## 2018-06-20 NOTE — Progress Notes (Signed)
Patient ID: LANEISHA MINO, female   DOB: Oct 10, 1926, 84 y.o.   MRN: [id number]    13 Days Post-Op  Subjective: Pt comfortable.  Just got back from IR.  No pain.    Objective: Vital signs in last 24 hours: Temp:  [98.1 F (36.7 C)-98.6 F (37 C)] 98.6 F (37 C) (07/21 2116) Pulse Rate:  [77-82] 77 (07/21 2116) Resp:  [16] 16 (07/21 2116) BP: (126-145)/(59-77) 126/59 (07/21 2116) SpO2:  [93 %] 93 % (07/21 2116) Weight:  [68.6 kg (151 lb 3.8 oz)] 68.6 kg (151 lb 3.8 oz) (07/22 0800) Last BM Date: 06/14/18  Intake/Output from previous day: 07/21 0701 - 07/22 0700 In: 1183.6 [I.V.:1153.6] Out: 1650 [Urine:750; Drains:900] Intake/Output this shift: No intake/output data recorded.  PE: Gen: [place], NAD Heart: regular Lungs: CTAB Abd: soft, NT, ND, GJ tube in place.  Lab Results:  Recent Labs    06/20/18 0523  WBC 6.8  HGB 8.0*  HCT 25.2*  PLT 487*   BMET Recent Labs    06/20/18 0523  NA 135  K 4.2  CL 102  CO2 26  GLUCOSE 104*  BUN 24*  CREATININE 0.54  CALCIUM 8.9   PT/INR No results for input(s): LABPROT, INR in the last 72 hours. CMP     Component Value Date/Time   NA 135 06/20/2018 0523   K 4.2 06/20/2018 0523   CL 102 06/20/2018 0523   CO2 26 06/20/2018 0523   GLUCOSE 104 (H) 06/20/2018 0523   BUN 24 (H) 06/20/2018 0523   CREATININE 0.54 06/20/2018 0523   CALCIUM 8.9 06/20/2018 0523   PROT 5.4 (L) 06/20/2018 0523   ALBUMIN 2.4 (L) 06/20/2018 0523   AST 33 06/20/2018 0523   ALT 46 (H) 06/20/2018 0523   ALKPHOS 161 (H) 06/20/2018 0523   BILITOT 0.5 06/20/2018 0523   GFRNONAA >60 06/20/2018 0523   GFRAA >60 06/20/2018 0523   Lipase     Component Value Date/Time   LIPASE 22 06/02/2018 2053       Studies/Results: No results found.  Anti-infectives: Anti-infectives (From admission, onward)   Start     Dose/Rate Route Frequency Ordered Stop   06/07/18 1415  cefoTEtan (CEFOTAN) 1 g in sodium chloride 0.9 % 100 mL IVPB  Status:   Discontinued     1 g 200 mL/hr over 30 Minutes Intravenous To ShortStay Surgical 06/07/18 1405 06/07/18 1409   06/07/18 1415  cefoTEtan in Dextrose 5% (CEFOTAN) IVPB 2 g  Status:  Discontinued     2 g 100 mL/hr over 30 Minutes Intravenous Every 12 hours 06/07/18 1408 06/07/18 1409   06/07/18 1415  cefoTEtan (CEFOTAN) 2 g in sodium chloride 0.9 % 100 mL IVPB  Status:  Discontinued     2 g 200 mL/hr over 30 Minutes Intravenous To ShortStay Surgical 06/07/18 1410 06/07/18 1851   06/07/18 1400  cefoTEtan in Dextrose 5% (CEFOTAN) IVPB 2 g  Status:  Discontinued     2 g 100 mL/hr over 30 Minutes Intravenous To ShortStay Surgical 06/07/18 1359 06/07/18 1409   06/07/18 0600  cefoTEtan in Dextrose 5% (CEFOTAN) IVPB 2 g  Status:  Discontinued     2 g 100 mL/hr over 30 Minutes Intravenous On call to O.R. 06/06/18 2009 06/07/18 1540   06/03/18 0900  ciprofloxacin (CIPRO) IVPB 400 mg  Status:  Discontinued     400 mg 200 mL/hr over 60 Minutes Intravenous Every 12 hours 06/03/18 0757 06/03/18 0759   06/03/18 0800  cefTRIAXone (ROCEPHIN) 1 g in sodium chloride 0.9 % 100 mL IVPB  Status:  Discontinued     1 g 200 mL/hr over 30 Minutes Intravenous Every 24 hours 06/03/18 0759 06/06/18 1135       Assessment/Plan Chest pain - resolved  UTI - per primary team, treated with Rocephin  Urinary retention  HTN GERD Diastolic CHF- stable  Nausea and vomiting Type III hiatal hernia S/P LAPAROSCOPIC REPAIR OF HH, LAPAROSCOPIC 72F GASTROSTOMY- 06/07/2018 - D. Newman - POD#13, afebrile, VSS -+ bowel function. but likely has a gastric ileus. IR placed GJ tube today.  Will ask dietitian to see patient to start trickle j-tube feeds - Minimize narcotics as able,scheduled p.o. Tylenol - OOB/mobilize,continuePT/OT- currently recommending SNF - Incentive spirometry  FEN: GJ, start trickle J-tube feeds, on TNA, will plan to decrease as  patient tolerates feeds ID: Rocephin 7/5-7/8, perioperative cefotetan 7/9  VTE: SCD's,subcutaneous heparin Foley: in place x5days,removed 7/11.purewick in place currently. Follow up - [place] for g-tube conversion to [place] Monday, then start J -tube feeds.  Once tolerates this can return to [facility] if bed available there.     LOS: 16 days    Henreitta Cea , [facility] Surgery 06/20/2018, 11:51 AM Pager: [phone]

## 2018-06-20 NOTE — Procedures (Signed)
Successful exchange on G-tube to GJ tube.  Tip in small bowel and ready to use.  No immediate complication.  No blood loss.

## 2018-06-20 NOTE — Progress Notes (Signed)
PHARMACY - ADULT TOTAL PARENTERAL NUTRITION CONSULT NOTE   Pharmacy Consult:  TPN Indication:  Obstruction from hiatal hernia  Patient Measurements: Height: 5' 1"  (154.9 cm) Weight: 158 lb 11.7 oz (72 kg) IBW/kg (Calculated) : 47.8 TPN AdjBW (KG): 53.1 Body mass index is 29.99 kg/m. Usual Weight: 68-70 kg  Assessment:  [name] presented from SNF on 06/02/18 with intractable nausea, vomiting, and abdominal/chest pain.  Patient has a history of a large hiatal hernia but is not a good surgical candidate.  She started to have coffee-ground emesis 06/04/18 and EGD showed no evidence of ulcer or active bleeding.    Patient denies recent weight loss and is at her usual weight.  She was eating 1/3 - 1/2 of her meal trays PTA.  She states that feeding tube is uncomfortable but she will give it a try if needed to maintain her nutrition. Spoke to [facility], placing a post-pyloric feeding tube is not feasible d/t anatomy/hernia.  Even with surgery, patient will not be able to have adequate nutrition anytime soon.  Pharmacy consulted to manage TPN.  GI: hx GERD/hiatal hernia.  S/p hernia repair with Gtube placement 7/9. Increased N/V on 7/14 with imaging showing adynamic ileus. Clamped G tube on 7/18, then patient became nauseous around 0130 on 7/19 so tube was reconnected back to gravity drainage. Output up at 925m yesterday. IR to placed GBienvilletube today (7/22). Plan to vent through G tube and feed through J tube. BL prealbumin 14.5 >> up to 25.2 (7/22), LBM 7/16 - Reglan, PPI IV, PRN Zofran. Endo: no hx DM - AM glucose ok. Insulin requirements in the past 24 hours: 0 units Lytes: wnl. Mag 1.8. Renal: SCr up slightly 0.54, BUN up 24- UOP down significantly, NS at 10/hr, LR at 10/hr Pulm: stable on RA Cards: HTN/HLD/CHF - VSS - PRN hydralazine Hepatobil: ALT and Alk Phos up slightly (46; 161). AST / tbili / TG WNL Neuro: pain score 0, on scheduled APAP ID: S/p 4d CTX for E.coli UTI. Afebrile, WBC ok TPN Access:  PICC placed 06/05/18 TPN start date: 06/06/18  Nutritional Goals (RD rec on 7/19): 1500-1700 kCal 85-100gm protein per day Fluid: 1.5-1.7 L  Current Nutrition:  NPO TPN  Plan:  Continue TPN at 65 ml/hr  This TPN provides 89 g of protein, 265 g of dextrose, and 39 g of lipids for a total of 1,647 kcals meeting 100% of patient needs Electrolytes in TPN: continue Na 1057m/L, Mg 1564mL; Cl:Ac 2:1 Add MVI and trace elements to TPN Continue LR and NS at 45m38m per MD Monitor TPN labs, SCr F/U toleration of J-tube feeds and ability to wean TPN.     JessSloan LeiterarmD, BCPS, BCCCP Clinical Pharmacist Clinical phone 06/20/2018 until 3:30PM - #[phone] hours, please call #[phone] 06/20/2018 8:25 AM

## 2018-06-20 NOTE — Clinical Social Work Note (Signed)
CSW continues to follow for discharge needs. SNF admissions coordinator aware patient may be discharging soon.  [name], [place]

## 2018-06-21 LAB — GLUCOSE, CAPILLARY
GLUCOSE-CAPILLARY: 114 mg/dL — AB (ref 70–99)
GLUCOSE-CAPILLARY: 109 mg/dL — AB (ref 70–99)
Glucose-Capillary: 113 mg/dL — ABNORMAL HIGH (ref 70–99)

## 2018-06-21 MED ORDER — OSMOLITE 1.2 CAL PO LIQD
1000.0000 mL | ORAL | Status: DC
  Administered 2018-06-21: 1000 mL
  Filled 2018-06-21: qty 1000

## 2018-06-21 MED ORDER — JEVITY 1.2 CAL PO LIQD: 1000.0000 mL | ORAL | Status: DC

## 2018-06-21 MED ORDER — TRAVASOL 10 % IV SOLN
INTRAVENOUS | Status: DC
  Administered 2018-06-21: 18:00:00 via INTRAVENOUS
  Filled 2018-06-21: qty 889.2

## 2018-06-21 NOTE — Progress Notes (Signed)
Flushed G-J tube with 30 ml sterile water at this time per MD order.

## 2018-06-21 NOTE — Progress Notes (Signed)
PHARMACY - ADULT TOTAL PARENTERAL NUTRITION CONSULT NOTE   Pharmacy Consult:  TPN Indication:  Obstruction from hiatal hernia  Patient Measurements: Height: 5' 1"  (154.9 cm) Weight: 151 lb 3.8 oz (68.6 kg) IBW/kg (Calculated) : 47.8 TPN AdjBW (KG): 53.1 Body mass index is 28.58 kg/m. Usual Weight: 68-70 kg  Assessment:  [name] presented from SNF on 06/02/18 with intractable nausea, vomiting, and abdominal/chest pain.  Patient has a history of a large hiatal hernia but is not a good surgical candidate.  She started to have coffee-ground emesis 06/04/18 and EGD showed no evidence of ulcer or active bleeding.    Patient denies recent weight loss and is at her usual weight.  She was eating 1/3 - 1/2 of her meal trays PTA.  She states that feeding tube is uncomfortable but she will give it a try if needed to maintain her nutrition. Spoke to [facility], placing a post-pyloric feeding tube is not feasible d/t anatomy/hernia.  Even with surgery, patient will not be able to have adequate nutrition anytime soon.  Pharmacy consulted to manage TPN.  GI: hx GERD/hiatal hernia.  S/p hernia repair with Gtube placement 7/9. Increased N/V on 7/14 with imaging showing adynamic ileus. Clamped G tube on 7/18, then patient became nauseous around 0130 on 7/19 so tube was reconnected back to gravity drainage. Output up at 900 mL 7/21; not charted yesterday. GJ tube placed 7/22- 125 mL output. Plan to vent through G tube and feed through J tube. BL prealbumin 14.5 >> up to 25.2 (7/22), LBM 7/16 - Reglan, PPI IV, PRN Zofran. Endo: no hx DM - AM glucose ok. Insulin requirements in the past 24 hours: 0 units Lytes: wnl. Mag 1.8. Renal: SCr up slightly 0.54, BUN up 24- UOP down significantly, NS at 10/hr, LR at 10/hr Pulm: stable on RA Cards: HTN/HLD/CHF - VSS - PRN hydralazine Hepatobil: ALT and Alk Phos up slightly (46; 161). AST / tbili / TG WNL Neuro: pain score 0, on scheduled APAP ID: S/p 4d CTX for E.coli UTI. 1/2  Blood Cx CoNS. Afebrile, WBC ok  TPN Access: PICC placed 06/05/18 TPN start date: 06/06/18  Nutritional Goals (RD rec on 7/19): 1500-1700 kCal 85-100gm protein per day Fluid: 1.5-1.7 L  Current Nutrition:  Clears for comfort Initiating TFs (7/23) TPN  Plan:  Continue TPN at 65 ml/hr  This TPN provides 89 g of protein, 265 g of dextrose, and 39 g of lipids for a total of 1,647 kcals meeting 100% of patient needs Electrolytes in TPN: continue Na 163mq/L, Mg 132m/L; Cl:Ac 2:1 Add MVI and trace elements to TPN Continue LR and NS at 1048mr per MD Monitor TPN labs, SCr F/U start and toleration of J-tube feeds and ability to wean TPN.    JesSloan LeiterharmD, BCPS, BCCCP Clinical Pharmacist Clinical phone 06/21/2018 until 3:30PM - #[phone] hours, please call #[phone] 06/21/2018 7:50 AM

## 2018-06-21 NOTE — Progress Notes (Signed)
Patient ID: CARRIANNE HYUN, female   DOB: [birth year], 84 y.o.   MRN: [id number]    14 Days Post-Op  Subjective: Pt feels well today.  No new complaints.  No pain  Objective: Vital signs in last 24 hours: Temp:  [97.7 F (36.5 C)-98.4 F (36.9 C)] 97.7 F (36.5 C) (07/23 0826) Pulse Rate:  [73-77] 77 (07/23 0826) Resp:  [15-16] 16 (07/23 0441) BP: (100-118)/(54-58) 105/57 (07/23 0826) SpO2:  [97 %-99 %] 99 % (07/23 0826) Last BM Date: 06/14/18  Intake/Output from previous day: 07/22 0701 - 07/23 0700 In: 1728.7 [I.V.:1698.7] Out: 1126 [Urine:1001; Drains:125] Intake/Output this shift: No intake/output data recorded.  PE: Heart: regular Lungs: CTAB Abd: soft, NT, ND, +BS, GJ tube in place.  g portion to gravity.  Lab Results:  Recent Labs    06/20/18 0523  WBC 6.8  HGB 8.0*  HCT 25.2*  PLT 487*   BMET Recent Labs    06/20/18 0523  NA 135  K 4.2  CL 102  CO2 26  GLUCOSE 104*  BUN 24*  CREATININE 0.54  CALCIUM 8.9   PT/INR No results for input(s): LABPROT, INR in the last 72 hours. CMP     Component Value Date/Time   NA 135 06/20/2018 0523   K 4.2 06/20/2018 0523   CL 102 06/20/2018 0523   CO2 26 06/20/2018 0523   GLUCOSE 104 (H) 06/20/2018 0523   BUN 24 (H) 06/20/2018 0523   CREATININE 0.54 06/20/2018 0523   CALCIUM 8.9 06/20/2018 0523   PROT 5.4 (L) 06/20/2018 0523   ALBUMIN 2.4 (L) 06/20/2018 0523   AST 33 06/20/2018 0523   ALT 46 (H) 06/20/2018 0523   ALKPHOS 161 (H) 06/20/2018 0523   BILITOT 0.5 06/20/2018 0523   GFRNONAA >60 06/20/2018 0523   GFRAA >60 06/20/2018 0523   Lipase     Component Value Date/Time   LIPASE 22 06/02/2018 2053       Studies/Results: Ir Gj Tube Change  Result Date: 06/20/2018 INDICATION: 84 year old with recent hiatal hernia repair and laparoscopic gastrostomy tube. Patient has an ileus and request for conversion to a GJ tube. EXAM: CONVERSION OF THE G TUBE TO A GJ TUBE WITH FLUOROSCOPY MEDICATIONS: None  ANESTHESIA/SEDATION: None CONTRAST:  50 mL Isovue-300-administered into the gastric lumen. FLUOROSCOPY TIME:  Fluoroscopy Time: 12 minutes 48 seconds (45 mGy). COMPLICATIONS: None immediate. PROCEDURE: Informed written consent was obtained from the patient after a thorough discussion of the procedural risks, benefits and alternatives. All questions were addressed. Maximal Sterile Barrier Technique was utilized including caps, mask, sterile gowns, sterile gloves, sterile drape, hand hygiene and skin antiseptic. A timeout was performed prior to the initiation of the procedure. The existing gastrostomy tube and surrounding skin were prepped and draped in a sterile fashion. Stomach was opacified with contrast. A 5 French C2 catheter was advanced into the distal stomach through the existing gastrostomy tube. Eventually, a stiff Glidewire was advanced into the duodenum and then into the jejunum. The existing 20 French gastrostomy tube was removed over the wire. A new 20 French GJ tube was advanced over the wire. It was difficult to advance the catheter over-the-wire without coiling the tube in the stomach. Eventually, the tube was advanced into the proximal jejunum. The balloon was inflated with 8 ml of normal saline. Contrast injection confirmed catheter tip in the small bowel. Gastric lumen is well positioned in the stomach. IMPRESSION: Successful conversion of the G tube to a GJ tube. Catheter is ready  to be used. Electronically Signed   By: Markus Daft M.D.   On: 06/20/2018 13:20    Anti-infectives: Anti-infectives (From admission, onward)   Start     Dose/Rate Route Frequency Ordered Stop   06/07/18 1415  cefoTEtan (CEFOTAN) 1 g in sodium chloride 0.9 % 100 mL IVPB  Status:  Discontinued     1 g 200 mL/hr over 30 Minutes Intravenous To ShortStay Surgical 06/07/18 1405 06/07/18 1409   06/07/18 1415  cefoTEtan in Dextrose 5% (CEFOTAN) IVPB 2 g  Status:  Discontinued     2 g 100 mL/hr over 30 Minutes  Intravenous Every 12 hours 06/07/18 1408 06/07/18 1409   06/07/18 1415  cefoTEtan (CEFOTAN) 2 g in sodium chloride 0.9 % 100 mL IVPB  Status:  Discontinued     2 g 200 mL/hr over 30 Minutes Intravenous To ShortStay Surgical 06/07/18 1410 06/07/18 1851   06/07/18 1400  cefoTEtan in Dextrose 5% (CEFOTAN) IVPB 2 g  Status:  Discontinued     2 g 100 mL/hr over 30 Minutes Intravenous To ShortStay Surgical 06/07/18 1359 06/07/18 1409   06/07/18 0600  cefoTEtan in Dextrose 5% (CEFOTAN) IVPB 2 g  Status:  Discontinued     2 g 100 mL/hr over 30 Minutes Intravenous On call to O.R. 06/06/18 2009 06/07/18 1540   06/03/18 0900  ciprofloxacin (CIPRO) IVPB 400 mg  Status:  Discontinued     400 mg 200 mL/hr over 60 Minutes Intravenous Every 12 hours 06/03/18 0757 06/03/18 0759   06/03/18 0800  cefTRIAXone (ROCEPHIN) 1 g in sodium chloride 0.9 % 100 mL IVPB  Status:  Discontinued     1 g 200 mL/hr over 30 Minutes Intravenous Every 24 hours 06/03/18 0759 06/06/18 1135       Assessment/Plan Chest pain - resolved  UTI - per primary team, treated with Rocephin  Urinary retention  HTN GERD Diastolic CHF- stable  Nausea and vomiting Type III hiatal hernia S/P LAPAROSCOPIC REPAIR OF HH, LAPAROSCOPIC 21F GASTROSTOMY- 06/07/2018 - D. [facility] - POD#14, afebrile, VSS -+ bowel function. but likely has a gastric ileus. IR placed GJ tube yesterday.             -  dietitian consult pending to start trickle TFs.  Spoke to them today to assure she was on their list.  May have clears from floor for comfort - Minimize narcotics as able,scheduled p.o. Tylenol - OOB/mobilize,continuePT/OT- currently recommending SNF - Incentive spirometry  FEN: GJ, start trickle J-tube feeds, on TNA, will plan to decrease as patient tolerates feeds ID: Rocephin 7/5-7/8, perioperative cefotetan 7/9  VTE: SCD's,subcutaneous heparin Foley: in place  x5days,removed 7/11.purewick in place currently. Follow up - [facility] -Once tolerates TFs can return to [facility] if bed available there.     LOS: 17 days    Henreitta Cea , [facility] Surgery 06/21/2018, 9:24 AM Pager: [phone]

## 2018-06-21 NOTE — Progress Notes (Signed)
Physical Therapy Treatment Patient Details Name: [name] MRN: [id number] DOB: 11/02/1926 Today's Date: 06/21/2018    History of Present Illness 15 YOF presented from SNF on 06/02/18 with intractable nausea, vomiting, and abdominal/chest pain.  Patient has a history of a large hiatal hernia but is not a good surgical candidate.  She started to have coffee-ground emesis 06/04/18 and EGD showed no evidence of ulcer or active bleeding. Pt underwent  LAPAROSCOPIC REPAIR OF HH, LAPAROSCOPIC 41F GASTROSTOMY     PT Comments    Pt with improving strength and mobility and improved pain.   Follow Up Recommendations  SNF;Supervision/Assistance - 24 hour     Equipment Recommendations  None recommended by PT    Recommendations for Other Services       Precautions / Restrictions Precautions Precautions: Fall Restrictions Weight Bearing Restrictions: No    Mobility  Bed Mobility Overal bed mobility: Needs Assistance Bed Mobility: Sit to Supine;Supine to Sit     Supine to sit: +2 for physical assistance;Mod assist     General bed mobility comments: Assist to bring legs off of bed, elevate trunk into sitting, and to bring hips to EOB  Transfers Overall transfer level: Needs assistance Equipment used: Rolling walker (2 wheeled);Ambulation equipment used Transfers: Sit to/from [facility] Sit to Stand: +2 physical assistance;Mod assist Stand pivot transfers: (Using [place])       General transfer comment: Assist to bring hips up from bed and for balance. Less assist needed when rising from seat of Stedy.  Ambulation/Gait                 Stairs             Wheelchair Mobility    Modified Rankin (Stroke Patients Only)       Balance Overall balance assessment: Needs assistance Sitting-balance support: Bilateral upper extremity supported;Feet supported Sitting balance-[name]: Poor Sitting balance - Comments: UE support. Fatigues and props  on rt elbow   Standing balance support: Bilateral upper extremity supported Standing balance-[name]: Poor Standing balance comment: Stedy or walker and +2 min assist for static standing. Stood with walker x 30 sec and with Stedy twice x 15 sec.                            Cognition Arousal/Alertness: Awake/alert Behavior During Therapy: WFL for tasks assessed/performed Overall Cognitive Status: Within Functional Limits for tasks assessed                                        Exercises      General Comments        Pertinent Vitals/Pain Pain Assessment: No/denies pain    Home Living                      Prior Function            PT Goals (current goals can now be found in the care plan section) Progress towards PT goals: Progressing toward goals    Frequency    Min 2X/week      PT Plan Current plan remains appropriate    Co-evaluation              AM-PAC PT "6 Clicks" Daily Activity  Outcome Measure  Difficulty turning over in bed (including adjusting bedclothes, sheets and  blankets)?: Unable Difficulty moving from lying on back to sitting on the side of the bed? : Unable Difficulty sitting down on and standing up from a chair with arms (e.g., wheelchair, bedside commode, etc,.)?: Unable Help needed moving to and from a bed to chair (including a wheelchair)?: Total Help needed walking in hospital room?: Total Help needed climbing 3-5 steps with a railing? : Total 6 Click Score: 6    End of Session Equipment Utilized During Treatment: Gait belt Activity Tolerance: Patient limited by fatigue Patient left: with call bell/phone within reach;in chair Nurse Communication: Mobility status;Need for lift equipment PT Visit Diagnosis: Unsteadiness on feet (R26.81);Muscle weakness (generalized) (M62.81)     Time: 1210-1229 PT Time Calculation (min) (ACUTE ONLY): 19 min  Charges:  $Therapeutic Activity: 8-22  mins                    G Codes:       [facility] PT [facility] 06/21/2018, 3:46 PM

## 2018-06-21 NOTE — Progress Notes (Addendum)
Nutrition Consult/Follow Up  DOCUMENTATION CODES:   Not applicable  INTERVENTION:    Start Osmolite 1.2 at 10 ml/hr and increase by 10 ml in 12 hours to 20 ml/hr  Advancement per MD discretion  Recommend:  Osmolite 1.2 at goal rate of 55 ml/hr with Prostat liquid protein 30 ml daily  Total provides 1684 kcals, 88 gm protein, 1082 ml of free water   NUTRITION DIAGNOSIS:   Inadequate oral intake related to altered GI function as evidenced by (need for EN support via GJ tube), ongoing  GOAL:   Patient will meet greater than or equal to 90% of their needs, met  MONITOR:   TF tolerance, Labs, Skin, Weight trends, I & O's  ASSESSMENT:   Patient has a PMH of HTN, HLD, GERD, mitral valve regurgitation, CHF, hiatal hernia, who presents with intractable nausea, vomiting, abdominal and chest pain likely related to hiatal hernia that contains most of her stomach in the sac.She vomited all day 7/5, had coffee ground emesis 7/6 with NGT inserted to suction which led to an improvement in her symptoms. EGD done 7/7 that found type III hiatal hernia with inflammation and erosion but no evidence of ulcer or active bleeding. Started on TPN on 7/8 due to inability to take PO. Surgical hiatal hernia repair and gastrostomy tube placement are likely next steps being discussed with the family at this point.   7/08 TPN started 7/09 hiatal hernia repair, #20 gastrostomy  7/14 ABG X-ray showed ileus  Pt s/p procedure 7/22: IR GJ TUBE CHANGE   Spoke with [name], PA-C. Plan to start trickle EN via J-port. Advance as tolerated. Will start pt on standard formula vs speciality. SNF unlikely to have specialty formula. [facility] note 7/23 reviewed. Pt may have clear liquids from floor for pleasure.  TPN prescription at 65 ml/hr. Providing 1647 kcals, 89 gm protein. Meeting 100% of estimated nutrition needs. PharmD to wean as EN advanced.  Labs and medications reviewed. Mg, Phos and K+ WNL.  Diet  Order:   Diet Order           Diet NPO time specified Except for: Ice Chips, Sips with Meds, Other (See Comments)  Diet effective now         EDUCATION NEEDS:   No education needs have been identified at this time  Skin:  Skin Assessment: Reviewed RN Assessment  Last BM:  7/16    Intake/Output Summary (Last 24 hours) at 06/21/2018 1045 Last data filed at 06/21/2018 0655 Gross per 24 hour  Intake 1728.73 ml  Output 1126 ml  Net 602.73 ml   Height:   Ht Readings from Last 1 Encounters:  06/03/18 5' 1"  (1.549 m)   Weight:   Wt Readings from Last 1 Encounters:  06/20/18 151 lb 3.8 oz (68.6 kg)   Ideal Body Weight:  47.72 kg  BMI:  Body mass index is 28.58 kg/m.  Estimated Nutritional Needs:   Kcal:  1500-1700  Protein:  85-100 gm  Fluid:  1.5-1.7 L  [name], RD, LDN Pager #: [phone] After-Hours Pager #: [phone]

## 2018-06-22 LAB — GLUCOSE, CAPILLARY
GLUCOSE-CAPILLARY: 111 mg/dL — AB (ref 70–99)
GLUCOSE-CAPILLARY: 118 mg/dL — AB (ref 70–99)
Glucose-Capillary: 107 mg/dL — ABNORMAL HIGH (ref 70–99)
Glucose-Capillary: 106 mg/dL — ABNORMAL HIGH (ref 70–99)
Glucose-Capillary: 103 mg/dL — ABNORMAL HIGH (ref 70–99)
Glucose-Capillary: 112 mg/dL — ABNORMAL HIGH (ref 70–99)

## 2018-06-22 MED ORDER — OSMOLITE 1.2 CAL PO LIQD
1000.0000 mL | ORAL | Status: DC
  Administered 2018-06-22 – 2018-06-23 (×3): 1000 mL
  Filled 2018-06-22 (×5): qty 1000

## 2018-06-22 MED ORDER — TRAVASOL 10 % IV SOLN
INTRAVENOUS | Status: DC
  Administered 2018-06-22: 19:00:00 via INTRAVENOUS
  Filled 2018-06-22: qty 684

## 2018-06-22 MED ORDER — TRAVASOL 10 % IV SOLN
INTRAVENOUS | Status: DC
  Filled 2018-06-22: qty 684

## 2018-06-22 NOTE — Progress Notes (Signed)
Nutrition Brief Note  RD contacted by [name], PA-C with [facility]. We discussed the nutrition care plan for [name]. Pt is currently tolerating Osmolite 1.2 at trickle rate of 20 ml via J-port. RD to advance Osmolite 1.2 by 10 ml every 12 hours to goal rate of 55 ml/hr. Hold off on adding Prostat liquid protein for now. Will order once pt is tolerating her TF at goal rate. RD to continue to follow.  [name], RD, LDN Pager #: [phone] After-Hours Pager #: [phone]

## 2018-06-22 NOTE — Progress Notes (Signed)
PHARMACY - ADULT TOTAL PARENTERAL NUTRITION CONSULT NOTE   Pharmacy Consult:  TPN Indication:  Obstruction from hiatal hernia  Patient Measurements: Height: 5' 1"  (154.9 cm) Weight: 150 lb 9.2 oz (68.3 kg) IBW/kg (Calculated) : 47.8 TPN AdjBW (KG): 53.1 Body mass index is 28.45 kg/m. Usual Weight: 68-70 kg  Assessment:  [name] presented from SNF on 06/02/18 with intractable nausea, vomiting, and abdominal/chest pain.  Patient has a history of a large hiatal hernia but is not a good surgical candidate.  She started to have coffee-ground emesis 06/04/18 and EGD showed no evidence of ulcer or active bleeding.    Patient denies recent weight loss and is at her usual weight.  She was eating 1/3 - 1/2 of her meal trays PTA.  She states that feeding tube is uncomfortable but she will give it a try if needed to maintain her nutrition. Spoke to [facility], placing a post-pyloric feeding tube is not feasible d/t anatomy/hernia.  Even with surgery, patient will not be able to have adequate nutrition anytime soon.  Pharmacy consulted to manage TPN.  GI: hx GERD/hiatal hernia.  S/p hernia repair with Gtube placement 7/9. Increased N/V on 7/14 with imaging showing adynamic ileus. Clamped G tube on 7/18, then patient became nauseous around 0130 on 7/19 so tube was reconnected back to gravity drainage. Output up at 900 mL 7/21; not charted yesterday. GJ tube placed 7/22- 125 mL output. Plan to vent through G tube and feed through J tube. BL prealbumin 14.5 >> up to 25.2 (7/22), LBM 7/16 - Reglan, PPI IV, PRN Zofran.  Tolerating at current rate but not yet at goal... Will decrease TPN rate slightly as TF's come on board. Endo: no hx DM - AM glucose ok. Insulin requirements in the past 24 hours: 0 units Lytes: wnl 7/22 - no new labs today Renal: SCr up slightly 0.54, BUN up 24- UOP down significantly, NS at 10/hr, LR at 10/hr Pulm: stable on RA Cards: HTN/HLD/CHF - VSS - PRN hydralazine Hepatobil: ALT and Alk Phos up  slightly (46; 161). AST / tbili / TG WNL Neuro: pain score 0, on scheduled APAP ID: S/p 4d CTX for E.coli UTI. 1/2 Blood Cx CoNS. Afebrile, WBC ok  TPN Access: PICC placed 06/05/18 TPN start date: 06/06/18  Nutritional Goals (RD rec on 7/19): 1500-1700 kCal 85-100gm protein per day Fluid: 1.5-1.7 L  Current Nutrition:  Clears for comfort Initiating TFs (7/23) TPN  Plan:  Decrease TPN to 50 ml/hr  This TPN provides 68.4 g of protein, 204 g of dextrose, and 30 g of lipids for a total of 1267 kcals meeting 100% of patient needs with the addition of TFs Electrolytes in TPN: continue Na 183mq/L, Mg 124m/L; Cl:Ac 2:1 Add MVI and trace elements to TPN Continue LR and NS at 1020mr per MD Monitor TPN labs, SCr F/U start and toleration of J-tube feeds and ability to wean TPN.    NitRober MinionharmD., MS Clinical Pharmacist Pager:  [phone] you for allowing pharmacy to be part of this patients care team. 06/22/2018 9:39 AM

## 2018-06-22 NOTE — Progress Notes (Signed)
Patient ID: [name], female   DOB: 11-05-1926, 84 y.o.   MRN: [id number]    15 Days Post-Op  Subjective: Patient feels well today.  States she is tolerating her TFs.  No nausea.  + flatus, no BM in a couple of days.  Does not feel bloated.  Objective: Vital signs in last 24 hours: Temp:  [98.5 F (36.9 C)-98.9 F (37.2 C)] 98.6 F (37 C) (07/24 0742) Pulse Rate:  [76-80] 80 (07/24 0308) Resp:  [18] 18 (07/24 0308) BP: (94-120)/(43-61) 98/54 (07/24 0742) SpO2:  [97 %-99 %] 97 % (07/24 0308) Weight:  [68.3 kg (150 lb 9.2 oz)] 68.3 kg (150 lb 9.2 oz) (07/24 0308) Last BM Date: 06/14/18  Intake/Output from previous day: 07/23 0701 - 07/24 0700 In: 1463.9 [I.V.:1271.6; NG/GT:192.3] Out: 575 [Urine:225; Drains:350] Intake/Output this shift: No intake/output data recorded.  PE: Abd: Soft, minimal bloating, but +BS, NT, G-tube to gravity and J-tube with feeds running at 20cc/hr Heart: regular Lungs: CTAB  Lab Results:  Recent Labs    06/20/18 0523  WBC 6.8  HGB 8.0*  HCT 25.2*  PLT 487*   BMET Recent Labs    06/20/18 0523  NA 135  K 4.2  CL 102  CO2 26  GLUCOSE 104*  BUN 24*  CREATININE 0.54  CALCIUM 8.9   PT/INR No results for input(s): LABPROT, INR in the last 72 hours. CMP     Component Value Date/Time   NA 135 06/20/2018 0523   K 4.2 06/20/2018 0523   CL 102 06/20/2018 0523   CO2 26 06/20/2018 0523   GLUCOSE 104 (H) 06/20/2018 0523   BUN 24 (H) 06/20/2018 0523   CREATININE 0.54 06/20/2018 0523   CALCIUM 8.9 06/20/2018 0523   PROT 5.4 (L) 06/20/2018 0523   ALBUMIN 2.4 (L) 06/20/2018 0523   AST 33 06/20/2018 0523   ALT 46 (H) 06/20/2018 0523   ALKPHOS 161 (H) 06/20/2018 0523   BILITOT 0.5 06/20/2018 0523   GFRNONAA >60 06/20/2018 0523   GFRAA >60 06/20/2018 0523   Lipase     Component Value Date/Time   LIPASE 22 06/02/2018 2053       Studies/Results: No results found.  Anti-infectives: Anti-infectives (From admission, onward)     Start     Dose/Rate Route Frequency Ordered Stop   06/07/18 1415  cefoTEtan (CEFOTAN) 1 g in sodium chloride 0.9 % 100 mL IVPB  Status:  Discontinued     1 g 200 mL/hr over 30 Minutes Intravenous To ShortStay Surgical 06/07/18 1405 06/07/18 1409   06/07/18 1415  cefoTEtan in Dextrose 5% (CEFOTAN) IVPB 2 g  Status:  Discontinued     2 g 100 mL/hr over 30 Minutes Intravenous Every 12 hours 06/07/18 1408 06/07/18 1409   06/07/18 1415  cefoTEtan (CEFOTAN) 2 g in sodium chloride 0.9 % 100 mL IVPB  Status:  Discontinued     2 g 200 mL/hr over 30 Minutes Intravenous To ShortStay Surgical 06/07/18 1410 06/07/18 1851   06/07/18 1400  cefoTEtan in Dextrose 5% (CEFOTAN) IVPB 2 g  Status:  Discontinued     2 g 100 mL/hr over 30 Minutes Intravenous To ShortStay Surgical 06/07/18 1359 06/07/18 1409   06/07/18 0600  cefoTEtan in Dextrose 5% (CEFOTAN) IVPB 2 g  Status:  Discontinued     2 g 100 mL/hr over 30 Minutes Intravenous On call to O.R. 06/06/18 2009 06/07/18 1540   06/03/18 0900  ciprofloxacin (CIPRO) IVPB 400 mg  Status:  Discontinued     400 mg 200 mL/hr over 60 Minutes Intravenous Every 12 hours 06/03/18 0757 06/03/18 0759   06/03/18 0800  cefTRIAXone (ROCEPHIN) 1 g in sodium chloride 0.9 % 100 mL IVPB  Status:  Discontinued     1 g 200 mL/hr over 30 Minutes Intravenous Every 24 hours 06/03/18 0759 06/06/18 1135       Assessment/Plan Chest pain - resolved  UTI - per primary team, treated with Rocephin  Urinary retention  HTN GERD Diastolic CHF- stable  Nausea and vomiting Type III hiatal hernia S/P LAPAROSCOPIC REPAIR OF HH, LAPAROSCOPIC 36F GASTROSTOMY- 06/07/2018 - D. Newman - POD#15, afebrile, VSS -+ bowel function. but likely has a gastric ileus. IR placed [place] tube 7/22             -  dietitian consult.  May slowly advance TFs to goal as long as she tolerates.   - Minimize narcotics as able,scheduled p.o. Tylenol -  OOB/mobilize,continuePT/OT- currently recommending SNF - Incentive spirometry  FEN: GJ, j-tube feed advancement.  Plan to start weaning TNA ID: Rocephin 7/5-7/8, perioperative cefotetan 7/9  VTE: SCD's,subcutaneous heparin Foley: in place x5days,removed 7/11.purewick in place currently. Follow up - [facility] -Once tolerates TFs can return to [facility] if bed available there.   LOS: 18 days    [name] , [facility] 06/22/2018, 10:48 AM Pager: [phone]

## 2018-06-23 LAB — GLUCOSE, CAPILLARY
GLUCOSE-CAPILLARY: 105 mg/dL — AB (ref 70–99)
GLUCOSE-CAPILLARY: 113 mg/dL — AB (ref 70–99)
GLUCOSE-CAPILLARY: 117 mg/dL — AB (ref 70–99)
GLUCOSE-CAPILLARY: 133 mg/dL — AB (ref 70–99)
Glucose-Capillary: 108 mg/dL — ABNORMAL HIGH (ref 70–99)

## 2018-06-23 LAB — COMPREHENSIVE METABOLIC PANEL
ALT: 37 U/L (ref 0–44)
AST: 26 U/L (ref 15–41)
Albumin: 2.6 g/dL — ABNORMAL LOW (ref 3.5–5.0)
Alkaline Phosphatase: 160 U/L — ABNORMAL HIGH (ref 38–126)
Anion gap: 6 (ref 5–15)
BUN: 23 mg/dL (ref 8–23)
CHLORIDE: 103 mmol/L (ref 98–111)
CO2: 24 mmol/L (ref 22–32)
Calcium: 8.7 mg/dL — ABNORMAL LOW (ref 8.9–10.3)
Creatinine, Ser: 0.45 mg/dL (ref 0.44–1.00)
GFR calc Af Amer: >60 mL/min (ref >60)
Glucose, Bld: 105 mg/dL — ABNORMAL HIGH (ref 70–99)
POTASSIUM: 4.3 mmol/L (ref 3.5–5.1)
SODIUM: 133 mmol/L — AB (ref 135–145)
Total Bilirubin: 0.3 mg/dL (ref 0.3–1.2)
Total Protein: 5.8 g/dL — ABNORMAL LOW (ref 6.5–8.1)

## 2018-06-23 LAB — PHOSPHORUS: PHOSPHORUS: 4.1 mg/dL (ref 2.5–4.6)

## 2018-06-23 LAB — MAGNESIUM: MAGNESIUM: 1.9 mg/dL (ref 1.7–2.4)

## 2018-06-23 MED ORDER — ELDERTONIC PO LIQD
15.0000 mL | Freq: Every day | ORAL | Status: DC
  Administered 2018-06-23: 15 mL
  Filled 2018-06-23 (×3): qty 15

## 2018-06-23 NOTE — NC FL2 (Addendum)
[place] MEDICAID FL2 LEVEL OF CARE SCREENING TOOL     IDENTIFICATION  Patient Name: [name] Birthdate: 1926/09/02 Sex: female Admission Date (Current Location): 06/02/2018  [facility] [place] Number:  Herbalist and Address:  [facility] [place], [place] [address], [place], [place] [address]      Provider Number: [id number]  Attending Physician Name and Address:  Edison Pace Md, MD  Relative Name and Phone Number:  Tonia Ghent, daughter, [phone]    Current Level of Care: Hospital Recommended Level of Care: [place] Prior Approval Number:    Date Approved/Denied:   PASRR Number: [id number]  Discharge Plan: SNF    Current Diagnoses: Patient Active Problem List   Diagnosis Date Noted  . Acute lower UTI 06/08/2018  . Acute urinary retention 06/08/2018  . Hiatal hernia 06/03/2018  . GERD (gastroesophageal reflux disease) 06/03/2018  . Chronic diastolic CHF (congestive heart failure) ([place]) 06/03/2018  . Acetaminophen overdose of undetermined intent 03/19/2018  . Chronic lower back pain 03/19/2018  . Acetaminophen overdose, intentional self-harm, initial encounter ([place]) 03/19/2018  . Constipation   . Generalized abdominal pain   . OSA (obstructive sleep apnea) 10/06/2016  . UTI (urinary tract infection) 05/06/2014  . Essential hypertension, benign 05/06/2014  . Humerus fracture 05/06/2014  . Dizziness 05/05/2014  . Fall 05/05/2014  . External hemorrhoid, thrombosed-left 01/24/2013  . Acute blood loss anemia 07/01/2012  . S/P left TH revision 06/28/2012  . Hyponatremia 06/11/2012  . Hip fx ([place]) 06/09/2012  . MITRAL VALVE DISORDERS 02/10/2010  . MURMUR 01/16/2010  . GERD 01/15/2010  . Osteoarthritis 01/15/2010  . BACK PAIN, CHRONIC 01/15/2010  . OSTEOPOROSIS 01/15/2010  . CHEST DISCOMFORT 01/15/2010  . GASTROINTESTINAL HEMORRHAGE, HX OF 01/15/2010    Orientation RESPIRATION BLADDER Height & Weight     Self, Time,  Situation, Place  Normal Continent, External catheter Weight: 70 kg (154 lb 5.2 oz) Height:  5\' 1"  (154.9 cm)  BEHAVIORAL SYMPTOMS/MOOD NEUROLOGICAL BOWEL NUTRITION STATUS      Continent J tube  AMBULATORY STATUS COMMUNICATION OF NEEDS Skin   Extensive Assist Verbally Surgical wounds(Closed incision on abdomen)                       Personal Care Assistance Level of Assistance  Bathing, Feeding, Dressing Bathing Assistance: Maximum assistance Feeding assistance: Limited assistance Dressing Assistance: Limited assistance     Functional Limitations Info  Sight, Hearing, Speech Sight Info: Adequate Hearing Info: Adequate Speech Info: Adequate    SPECIAL CARE FACTORS FREQUENCY  OT (By licensed OT), PT (By licensed PT)     PT Frequency: 2x wk OT Frequency: 2x wk            Contractures Contractures Info: Not present    Additional Factors Info  Code Status, Allergies Code Status Info: DNR Allergies Info: Aspirin, Boniva Ibandronic Acid, Morphine, Nitrofuran Derivatives, Penicillins, Sulfonamide Derivatives   Insulin Sliding Scale Info: Every 6 hours       Current Medications (06/23/2018):  This is the current hospital active medication list Current Facility-Administered Medications  Medication Dose Route Frequency Provider Last Rate Last Dose  . 0.9 %  sodium chloride infusion   Intravenous Continuous Samuella Cota, MD 10 mL/hr at 06/15/18 1956    . acetaminophen (TYLENOL) tablet 1,000 mg  1,000 mg Oral Q8H Samuella Cota, MD   1,000 mg at 06/23/18 1500  . chlorhexidine (PERIDEX) 0.12 % solution 15 mL  15  mL Mouth Rinse BID Samuella Cota, MD   15 mL at 06/23/18 1153  . feeding supplement (OSMOLITE 1.2 CAL) liquid 1,000 mL  1,000 mL Per Tube Continuous [name], PA-C 50 mL/hr at 06/23/18 1606 1,000 mL at 06/23/18 1606  . geriatric multivitamins-minerals (ELDERTONIC/GEVRABON) liquid 15 mL  15 mL Per Tube Daily Earnstine Regal, PA-C      . heparin  injection 5,000 Units  5,000 Units Subcutaneous Q8H Monia Sabal, PA-C   5,000 Units at 06/23/18 1500  . hydrALAZINE (APRESOLINE) injection 5 mg  5 mg Intravenous Q2H PRN Samuella Cota, MD   5 mg at 06/06/18 2305  . lidocaine (XYLOCAINE) 2 % viscous mouth solution   Mouth/Throat PRN Markus Daft, MD   5 mL at 06/20/18 0956  . MEDLINE mouth rinse  15 mL Mouth Rinse q12n4p Samuella Cota, MD   15 mL at 06/23/18 1153  . nitroGLYCERIN (NITROSTAT) SL tablet 0.4 mg  0.4 mg Sublingual Q5 min PRN Samuella Cota, MD      . ondansetron [facility]) injection 4 mg  4 mg Intravenous Q8H PRN Samuella Cota, MD   4 mg at 06/15/18 0342  . pantoprazole (PROTONIX) injection 40 mg  40 mg Intravenous Q12H Samuella Cota, MD   40 mg at 06/23/18 1129  . polyethylene glycol (MIRALAX / GLYCOLAX) packet 17 g  17 g Oral Q12H PRN Jill Alexanders, PA-C      . sodium chloride flush (NS) 0.9 % injection 10-40 mL  10-40 mL Intracatheter Q12H Samuella Cota, MD   10 mL at 06/23/18 1153  . sodium chloride flush (NS) 0.9 % injection 10-40 mL  10-40 mL Intracatheter PRN Samuella Cota, MD   10 mL at 06/17/18 1709  . zolpidem (AMBIEN) tablet 5 mg  5 mg Oral QHS PRN Georganna Skeans, MD   5 mg at 06/22/18 2131     Discharge Medications: Please see discharge summary for a list of discharge medications.  Relevant Imaging Results:  Relevant Lab Results:   Additional Information SS# [id number]  Benard Halsted, LCSWA

## 2018-06-23 NOTE — Progress Notes (Addendum)
16 Days Post-Op    CC:  Coffee ground emesis with large hiatal hernia  Subjective: She is wheelchair bound at SNF.  She isn't sure why, she says Medicare quit paying for Therapy. They are transferring her to the chair with a Hoyer lift.  Her tube feeds are up to 40 ml/hr, and Pharmacy is weaning TNA.  Tolerating TF, but says she has not had a BM.  Objective: Vital signs in last 24 hours: Temp:  [97.5 F (36.4 C)-98.9 F (37.2 C)] 97.5 F (36.4 C) (07/25 0520) Pulse Rate:  [79-82] 82 (07/25 0520) Resp:  [18-20] 20 (07/25 0520) BP: (108-112)/(48-52) 112/52 (07/25 0520) SpO2:  [97 %-98 %] 97 % (07/25 0520) Weight:  [70 kg (154 lb 5.2 oz)] 70 kg (154 lb 5.2 oz) (07/25 0520) Last BM Date: 06/14/18 840 PO 429 IV Urine 1775 drain 475 G tube Afebrile, VSS No labs today  Intake/Output from previous day: 07/24 0701 - 07/25 0700 In: 1263.9 [P.O.:840; I.V.:423.9] Out: 2250 [Urine:1775; Drains:475] Intake/Output this shift: No intake/output data recorded.  General appearance: alert, cooperative and no distress Resp: clear to auscultation bilaterally GI: soft, non-tender; bowel sounds normal; no masses,  no organomegaly and G tube had 475 out yesterday.  J portion is being used for tube feeds at 40 ml/hr.  Lab Results:  No results for input(s): WBC, HGB, HCT, PLT in the last 72 hours.  BMET Recent Labs    06/23/18 0600  NA 133*  K 4.3  CL 103  CO2 24  GLUCOSE 105*  BUN 23  CREATININE 0.45  CALCIUM 8.7*   PT/INR No results for input(s): LABPROT, INR in the last 72 hours.  Recent Labs  Lab 06/20/18 0523 06/23/18 0600  AST 33 26  ALT 46* 37  ALKPHOS 161* 160*  BILITOT 0.5 0.3  PROT 5.4* 5.8*  ALBUMIN 2.4* 2.6*     Lipase     Component Value Date/Time   LIPASE 22 06/02/2018 2053     Medications: . acetaminophen  1,000 mg Oral Q8H  . chlorhexidine  15 mL Mouth Rinse BID  . heparin injection (subcutaneous)  5,000 Units Subcutaneous Q8H  . mouth rinse  15 mL  Mouth Rinse q12n4p  . pantoprazole  40 mg Intravenous Q12H  . sodium chloride flush  10-40 mL Intracatheter Q12H   . sodium chloride 10 mL/hr at 06/15/18 1956  . feeding supplement (OSMOLITE 1.2 CAL) 40 mL/hr at 06/23/18 0308   Anti-infectives (From admission, onward)   Start     Dose/Rate Route Frequency Ordered Stop   06/07/18 1415  cefoTEtan (CEFOTAN) 1 g in sodium chloride 0.9 % 100 mL IVPB  Status:  Discontinued     1 g 200 mL/hr over 30 Minutes Intravenous To ShortStay Surgical 06/07/18 1405 06/07/18 1409   06/07/18 1415  cefoTEtan in Dextrose 5% (CEFOTAN) IVPB 2 g  Status:  Discontinued     2 g 100 mL/hr over 30 Minutes Intravenous Every 12 hours 06/07/18 1408 06/07/18 1409   06/07/18 1415  cefoTEtan (CEFOTAN) 2 g in sodium chloride 0.9 % 100 mL IVPB  Status:  Discontinued     2 g 200 mL/hr over 30 Minutes Intravenous To ShortStay Surgical 06/07/18 1410 06/07/18 1851   06/07/18 1400  cefoTEtan in Dextrose 5% (CEFOTAN) IVPB 2 g  Status:  Discontinued     2 g 100 mL/hr over 30 Minutes Intravenous To ShortStay Surgical 06/07/18 1359 06/07/18 1409   06/07/18 0600  cefoTEtan in Dextrose 5% (  CEFOTAN) IVPB 2 g  Status:  Discontinued     2 g 100 mL/hr over 30 Minutes Intravenous On call to O.R. 06/06/18 2009 06/07/18 1540   06/03/18 0900  ciprofloxacin (CIPRO) IVPB 400 mg  Status:  Discontinued     400 mg 200 mL/hr over 60 Minutes Intravenous Every 12 hours 06/03/18 0757 06/03/18 0759   06/03/18 0800  cefTRIAXone (ROCEPHIN) 1 g in sodium chloride 0.9 % 100 mL IVPB  Status:  Discontinued     1 g 200 mL/hr over 30 Minutes Intravenous Every 24 hours 06/03/18 0759 06/06/18 1135      Assessment/Plan Chest pain - resolved  UTI - per primary team, treated with Rocephin  Urinary retention  HTN GERD Diastolic CHF- stable  Nausea and vomiting Type III hiatal hernia S/P LAPAROSCOPIC REPAIR OF HH, LAPAROSCOPIC 56F GASTROSTOMY- 06/07/2018 - D. Newman - POD#16, afebrile,  VSS -Post op gastric ileus. IR placed [place] tube 7/22 - dietitian consult.  May slowly advance TFs to goal as long as                 she tolerates.   - Minimize narcotics as able,scheduled p.o. Tylenol - OOB/mobilize,continuePT/OT- currently recommending SNF - Incentive spirometry  FEN: NPO except ice,GJ, j-tube feed advancement. Goal rate 55  ID: Rocephin 7/5-7/8, perioperative cefotetan 7/9  VTE: SCD's,subcutaneous heparin Foley: in place x5days,removed 7/11.purewick in place currently. Follow up - [facility]  Plan:  Continue to advance TF, continue G tube drainage for ongoing ileus.  Wean TNA. I told pharmacy they could stop with current bag. Social worker notes SNF aware of pending return. I don't see a PT note for some time.  Will reorder. Once she is up to goal we can transfer to SNF.      LOS: 19 days    [name] 06/23/2018 [phone]

## 2018-06-23 NOTE — Discharge Summary (Signed)
Physician Discharge Summary  Patient ID: [name] MRN: [id number] DOB/AGE: [birth year] 84 y.o.  Admit date: 06/02/2018 Discharge date: 06/24/2018  Admission Diagnoses:  Intractable nausea and vomiting Hiatal hernia Chest pain with elevated troponins Hypertension GERD Chronic diastolic congestive heart failure -grade 1 diastolic dysfunction, EF 55  Discharge Diagnoses:   Intractable nausea and vomiting Type III hiatal hernia Prolonged post ileus Moderate malnutrition Deconditioning Chest pain with demand ischemia on admission - resolved Diastolic congestive heart failure -stable Hypertension GERD Anemia of chronic disease Urinary retention    Principal Problem:   Incarcerated hiatal hernia s/p repair/Gtube 06/07/2018 Active Problems:   Essential hypertension, benign   GERD (gastroesophageal reflux disease)   Chronic diastolic CHF (congestive heart failure) ([place])   Acute lower UTI   Acute urinary retention   Gastrostomy tube in place [facility])   PROCEDURES:  1.  EGD 06/05/2018 Dr. Alessandra Bevels 2.  Laparoscopic repair of hiatal hernia, laparoscopic #20 gastrostomy tube placement 06/07/2018 Dr. Alphonsa Overall 3.  Conversion of gastrostomy tube to gastrojejunostomy tube, 06/20/2018 Dr. Markus Daft (IR)   Hospital Course:  [name] is a 84 y.o. female with medical history significant of hypertension, hyperlipidemia, GERD, mitral valve regurg, dCHF, hiatal hernia, who presents with intractable nausea, vomiting, abdominal pain, and chest pain.  Per patient's daughter, patient started having intractable nausea vomiting and abdominal pain since this morning.  Patient vomited constantly for the whole day, without blood in the vomitus.  Patient does not have diarrhea.  Her abdominal pain is located in the upper abdomen, constant, sharp, moderate, radiating to lower chest and left neck.  Patient also had mild chest pain earlier, which has resolved.  Currently patient does not have  chest pain, shortness of breath, cough.  No fever or chills.  Patient has mild burning on urination recently, but no dysuria or urinary frequency.  She is taking trimethoprim chronically for prophylaxis of her UTI.  No unilateral weakness.  Of note, patient has history of large hiatal hernia, and had similar presentation of nausea, vomiting and abdominal pain in the past.  Per her daughter, patient is not a good candidate for surgery.  ED Course: pt was found to have WBC 16.5, lactic acid 2.51, negative troponin, lipase 22, electrolytes renal function okay, pending urinalysis, temperature normal, tachycardia, tachypnea, oxygen saturation 94% on room air.  CT abdomen/pelvis showed: 1. Large hiatal hernia with distended stomach in the LOWER LEFT chest/UPPER abdomen with unchanged appearance and configuration of the stomach when compared to 08/05/2015. 2. Distended bladder. 3. Moderate paraumbilical hernia containing fat 4. Aortic Atherosclerosis (ICD10-I70.0).  Patient was admitted and seen by cardiology to evaluate her for chest pain, abdominal pain nausea and vomiting.  She has some elevated troponins and this was thought to be secondary to acute infection, nausea vomiting and lactic acidosis.  This was consistent with demand ischemia.  Cardiology signed off.  She was then seen by GI, and subsequently underwent EGD by Dr. Alessandra Bevels.  This showed a type III hiatal hernia with inflammation and erosive of the fundus.  Possible partial torsion was also present.  There is a large amount of retained food in the hernia sac no evidence of ulcer or active bleeding.  It was difficult to get the scope to advance in the gastric antrum. Was recommended she undergo surgical repair.  She was seen in consultation by Dr. Gurney Maxin on 06/04/2018.  He was concerned that she could tolerate and recover from this type of surgery.  She  was seen by Dr. Alphonsa Overall on 06/06/2018.  He reviewed all the findings and discussed  the risk and benefits with the patient and her daughter.  They plan to go ahead with a hiatal hernia repair and probable gastrostomy tube placement the following day.  Patient then underwent the procedure above and did fairly well postoperatively.  She had a prolonged ileus with ongoing gastrostomy drainage.  We could never get her up to a clear diet secondary to her ongoing nausea.  We placed the gastrostomy tube to straight drain to assist with her nausea, and she was started on TNA for nutritional support.  Pre-albumin postop was 14.5.  Patient is severely deconditioned on admission and was wheelchair-bound.  She would transfer from bed to the chair.  She reports she was walking at one time, but PT was discontinued and she has been in bed and wheelchair-bound for some time. We were eventually able to convert the gastrostomy tube to gastrojejunostomy tube.  The gastrostomy portion of the tube was placed to straight drain with a Foley catheter bag at the end.  We started trickle feeds through the jejunostomy portion of the tube.  The jejunostomy tubes are slowly being advanced.  She has a goal rate of 55 ml/hr, she is on Osmolite 1.2.  TNA was weaned.  Her port sites are all healing nicely.  She will need to keep her gastrostomy portion of the tube to straight drain, and continue tube feedings and free water thru the jejunostomy portion of the GJ tube.  On her current tube feeding her rate is 55 ml per hour.  Your Dietician can adjust that as her needs change at the SNF.  She is ready to go back to SNF.  We have follow up arranged and listed below. We recommend OT/PT, keep the gastrostomy portion of the gastrostomy-jejunostomy tube to straight drain and the Jejunostomy portion to provide tube feedings.  Do not put any crushed up pills in the jejunostomy portion of the GJ tube.   CBC Latest Ref Rng & Units 06/20/2018 06/13/2018 06/08/2018  WBC 4.0 - 10.5 K/uL 6.8 10.7(H) 17.7(H)  Hemoglobin 12.0 - 15.0 g/dL  8.0(L) 8.0(L) 9.9(L)  Hematocrit 36.0 - 46.0 % 25.2(L) 24.8(L) 30.9(L)  Platelets 150 - 400 K/uL 487(H) 315 317   CMP Latest Ref Rng & Units 06/23/2018 06/20/2018 06/16/2018  Glucose 70 - 99 mg/dL 105(H) 104(H) 106(H)  BUN 8 - 23 mg/dL 23 24(H) 19  Creatinine 0.44 - 1.00 mg/dL 0.45 0.54 0.48  Sodium 135 - 145 mmol/L 133(L) 135 134(L)  Potassium 3.5 - 5.1 mmol/L 4.3 4.2 4.0  Chloride 98 - 111 mmol/L 103 102 102  CO2 22 - 32 mmol/L 24 26 28   Calcium 8.9 - 10.3 mg/dL 8.7(L) 8.9 8.4(L)  Total Protein 6.5 - 8.1 g/dL 5.8(L) 5.4(L) 4.8(L)  Total Bilirubin 0.3 - 1.2 mg/dL 0.3 0.5 0.3  Alkaline Phos 38 - 126 U/L 160(H) 161(H) 94  AST 15 - 41 U/L 26 33 21  ALT 0 - 44 U/L 37 46(H) 26    Condition on discharge:  Improved   Disposition:    Allergies as of 06/24/2018      Reactions   Aspirin Other (See Comments)   Unknown   Boniva [ibandronic Acid] Other (See Comments)   Unknown   Morphine Other (See Comments)   Unknown   Nitrofuran Derivatives Other (See Comments)   Unknown   Penicillins Other (See Comments)   Received Ancef without problem  Has patient had a PCN reaction causing immediate rash, facial/tongue/throat swelling, SOB or lightheadedness with hypotension: Unknown Has patient had a PCN reaction causing severe rash involving mucus membranes or skin necrosis: Unknown Has patient had a PCN reaction that required hospitalization: Unknown Has patient had a PCN reaction occurring within the last 10 years: Unknown If all of the above answers are "NO", then may proceed with Cephalosporin use.   Sulfonamide Derivatives Other (See Comments)   Unknown      Medication List    STOP taking these medications   CALCIUM 600+D3 600-400 MG-UNIT Tabs Generic drug:  Calcium Carbonate-Vitamin D3   CENTRUM SILVER ULTRA WOMENS PO   cetirizine 10 MG tablet Commonly known as:  ZYRTEC   docusate sodium 100 MG capsule Commonly known as:  COLACE   gabapentin 100 MG capsule Commonly known  as:  NEURONTIN   irbesartan 75 MG tablet Commonly known as:  AVAPRO   omeprazole 20 MG capsule Commonly known as:  PRILOSEC   ondansetron 4 MG tablet Commonly known as:  ZOFRAN   oxyCODONE 5 MG immediate release tablet Commonly known as:  Oxy IR/ROXICODONE   PRESERVISION AREDS 2 PO   PROBIOTIC DAILY Caps   pseudoephedrine-guaifenesin 60-600 MG 12 hr tablet Commonly known as:  MUCINEX D   spironolactone 25 MG tablet Commonly known as:  ALDACTONE   trimethoprim 100 MG tablet Commonly known as:  TRIMPEX     TAKE these medications   acetaminophen 500 MG tablet Commonly known as:  TYLENOL Take 2 tablets (1,000 mg total) by mouth every 8 (eight) hours as needed.   estradiol 0.1 MG/GM vaginal cream Commonly known as:  ESTRACE Place 1 Applicatorful vaginally 2 (two) times a week. Mondays and Thursdays   feeding supplement (OSMOLITE 1.2 CAL) Liqd Continue tube feeding at 55 ml/hr with adjustments per your dietician at the SNF   geriatric multivitamins-minerals Liqd Place 15 mLs into feeding tube daily.   pantoprazole sodium 40 mg/20 mL Pack Commonly known as:  PROTONIX Place 20 mLs (40 mg total) into feeding tube 2 (two) times daily.   polyethylene glycol packet Commonly known as:  MIRALAX / GLYCOLAX Take 17 g by mouth daily.       Contact information for follow-up providers    Alphonsa Overall, MD Follow up on 07/07/2018.   Specialty:  General Surgery Why:  Your appointment is at 3:15 PM.  Be at the office 30 minutes early for check in.  Bring photo ID and insurance information. Contact information: [address] [place] [address] [phone]        Deland Pretty, MD Follow up.   Specialty:  Internal Medicine Why:  Let Dr. Pennie Banter office know you have had surgery. Contact information: [address] [place] [address] [phone]        Burnell Blanks, MD .   Specialty:  Cardiology Contact information: [address] [place] [address] [phone]            Contact information for after-discharge care    Destination    [facility] .   Service:  Skilled Nursing Contact information: [place] [phone]                  Signed: Earnstine Regal 06/24/2018, 11:14 AM

## 2018-06-24 DIAGNOSIS — J309 Allergic rhinitis, unspecified: Secondary | ICD-10-CM | POA: Diagnosis present

## 2018-06-24 DIAGNOSIS — K440 Diaphragmatic hernia with obstruction, without gangrene: Secondary | ICD-10-CM | POA: Diagnosis not present

## 2018-06-24 DIAGNOSIS — K567 Ileus, unspecified: Secondary | ICD-10-CM | POA: Diagnosis not present

## 2018-06-24 DIAGNOSIS — M25522 Pain in left elbow: Secondary | ICD-10-CM | POA: Diagnosis not present

## 2018-06-24 DIAGNOSIS — R319 Hematuria, unspecified: Secondary | ICD-10-CM | POA: Diagnosis not present

## 2018-06-24 DIAGNOSIS — H3530 Unspecified macular degeneration: Secondary | ICD-10-CM | POA: Diagnosis present

## 2018-06-24 DIAGNOSIS — M455 Ankylosing spondylitis of thoracolumbar region: Secondary | ICD-10-CM | POA: Diagnosis not present

## 2018-06-24 DIAGNOSIS — Z96622 Presence of left artificial elbow joint: Secondary | ICD-10-CM | POA: Diagnosis present

## 2018-06-24 DIAGNOSIS — E559 Vitamin D deficiency, unspecified: Secondary | ICD-10-CM | POA: Diagnosis not present

## 2018-06-24 DIAGNOSIS — I110 Hypertensive heart disease with heart failure: Secondary | ICD-10-CM | POA: Diagnosis present

## 2018-06-24 DIAGNOSIS — A419 Sepsis, unspecified organism: Secondary | ICD-10-CM | POA: Diagnosis present

## 2018-06-24 DIAGNOSIS — E039 Hypothyroidism, unspecified: Secondary | ICD-10-CM | POA: Diagnosis not present

## 2018-06-24 DIAGNOSIS — M6281 Muscle weakness (generalized): Secondary | ICD-10-CM | POA: Diagnosis not present

## 2018-06-24 DIAGNOSIS — K449 Diaphragmatic hernia without obstruction or gangrene: Secondary | ICD-10-CM | POA: Diagnosis not present

## 2018-06-24 DIAGNOSIS — R079 Chest pain, unspecified: Secondary | ICD-10-CM | POA: Diagnosis not present

## 2018-06-24 DIAGNOSIS — R1311 Dysphagia, oral phase: Secondary | ICD-10-CM | POA: Diagnosis not present

## 2018-06-24 DIAGNOSIS — B9620 Unspecified Escherichia coli [E. coli] as the cause of diseases classified elsewhere: Secondary | ICD-10-CM | POA: Diagnosis present

## 2018-06-24 DIAGNOSIS — I10 Essential (primary) hypertension: Secondary | ICD-10-CM | POA: Diagnosis not present

## 2018-06-24 DIAGNOSIS — I340 Nonrheumatic mitral (valve) insufficiency: Secondary | ICD-10-CM | POA: Diagnosis present

## 2018-06-24 DIAGNOSIS — M25532 Pain in left wrist: Secondary | ICD-10-CM | POA: Diagnosis present

## 2018-06-24 DIAGNOSIS — D390 Neoplasm of uncertain behavior of uterus: Secondary | ICD-10-CM | POA: Diagnosis not present

## 2018-06-24 DIAGNOSIS — K219 Gastro-esophageal reflux disease without esophagitis: Secondary | ICD-10-CM | POA: Diagnosis present

## 2018-06-24 DIAGNOSIS — M2550 Pain in unspecified joint: Secondary | ICD-10-CM | POA: Diagnosis not present

## 2018-06-24 DIAGNOSIS — G4733 Obstructive sleep apnea (adult) (pediatric): Secondary | ICD-10-CM | POA: Diagnosis present

## 2018-06-24 DIAGNOSIS — M1990 Unspecified osteoarthritis, unspecified site: Secondary | ICD-10-CM | POA: Diagnosis not present

## 2018-06-24 DIAGNOSIS — R41841 Cognitive communication deficit: Secondary | ICD-10-CM | POA: Diagnosis not present

## 2018-06-24 DIAGNOSIS — R509 Fever, unspecified: Secondary | ICD-10-CM | POA: Diagnosis not present

## 2018-06-24 DIAGNOSIS — M79603 Pain in arm, unspecified: Secondary | ICD-10-CM | POA: Diagnosis not present

## 2018-06-24 DIAGNOSIS — I509 Heart failure, unspecified: Secondary | ICD-10-CM | POA: Diagnosis not present

## 2018-06-24 DIAGNOSIS — K5909 Other constipation: Secondary | ICD-10-CM | POA: Diagnosis present

## 2018-06-24 DIAGNOSIS — D649 Anemia, unspecified: Secondary | ICD-10-CM | POA: Diagnosis not present

## 2018-06-24 DIAGNOSIS — M810 Age-related osteoporosis without current pathological fracture: Secondary | ICD-10-CM | POA: Diagnosis present

## 2018-06-24 DIAGNOSIS — Z931 Gastrostomy status: Secondary | ICD-10-CM | POA: Diagnosis not present

## 2018-06-24 DIAGNOSIS — S63592A Other specified sprain of left wrist, initial encounter: Secondary | ICD-10-CM | POA: Diagnosis present

## 2018-06-24 DIAGNOSIS — E119 Type 2 diabetes mellitus without complications: Secondary | ICD-10-CM | POA: Diagnosis not present

## 2018-06-24 DIAGNOSIS — L570 Actinic keratosis: Secondary | ICD-10-CM | POA: Diagnosis present

## 2018-06-24 DIAGNOSIS — Z96621 Presence of right artificial elbow joint: Secondary | ICD-10-CM | POA: Diagnosis present

## 2018-06-24 DIAGNOSIS — Z79899 Other long term (current) drug therapy: Secondary | ICD-10-CM | POA: Diagnosis not present

## 2018-06-24 DIAGNOSIS — M25512 Pain in left shoulder: Secondary | ICD-10-CM | POA: Diagnosis not present

## 2018-06-24 DIAGNOSIS — Z48815 Encounter for surgical aftercare following surgery on the digestive system: Secondary | ICD-10-CM | POA: Diagnosis not present

## 2018-06-24 DIAGNOSIS — M79602 Pain in left arm: Secondary | ICD-10-CM | POA: Diagnosis not present

## 2018-06-24 DIAGNOSIS — E871 Hypo-osmolality and hyponatremia: Secondary | ICD-10-CM | POA: Diagnosis present

## 2018-06-24 DIAGNOSIS — D6489 Other specified anemias: Secondary | ICD-10-CM | POA: Diagnosis not present

## 2018-06-24 DIAGNOSIS — Z8249 Family history of ischemic heart disease and other diseases of the circulatory system: Secondary | ICD-10-CM | POA: Diagnosis not present

## 2018-06-24 DIAGNOSIS — Z96653 Presence of artificial knee joint, bilateral: Secondary | ICD-10-CM | POA: Diagnosis present

## 2018-06-24 DIAGNOSIS — Z96611 Presence of right artificial shoulder joint: Secondary | ICD-10-CM | POA: Diagnosis present

## 2018-06-24 DIAGNOSIS — I5032 Chronic diastolic (congestive) heart failure: Secondary | ICD-10-CM | POA: Diagnosis present

## 2018-06-24 DIAGNOSIS — E162 Hypoglycemia, unspecified: Secondary | ICD-10-CM | POA: Diagnosis not present

## 2018-06-24 DIAGNOSIS — Z7401 Bed confinement status: Secondary | ICD-10-CM | POA: Diagnosis not present

## 2018-06-24 DIAGNOSIS — N390 Urinary tract infection, site not specified: Secondary | ICD-10-CM | POA: Diagnosis present

## 2018-06-24 DIAGNOSIS — R262 Difficulty in walking, not elsewhere classified: Secondary | ICD-10-CM | POA: Diagnosis not present

## 2018-06-24 DIAGNOSIS — E785 Hyperlipidemia, unspecified: Secondary | ICD-10-CM | POA: Diagnosis present

## 2018-06-24 DIAGNOSIS — R278 Other lack of coordination: Secondary | ICD-10-CM | POA: Diagnosis not present

## 2018-06-24 LAB — GLUCOSE, CAPILLARY
GLUCOSE-CAPILLARY: 115 mg/dL — AB (ref 70–99)
GLUCOSE-CAPILLARY: 92 mg/dL (ref 70–99)
Glucose-Capillary: 88 mg/dL (ref 70–99)
Glucose-Capillary: 98 mg/dL (ref 70–99)

## 2018-06-24 MED ORDER — PANTOPRAZOLE SODIUM 40 MG PO PACK: 40.0000 mg | PACK | Freq: Two times a day (BID) | ORAL | Status: DC

## 2018-06-24 MED ORDER — ACETAMINOPHEN 160 MG/5ML PO SOLN: 1000.0000 mg | Freq: Three times a day (TID) | ORAL | Status: DC | PRN

## 2018-06-24 MED ORDER — POLYETHYLENE GLYCOL 3350 17 G PO PACK: 17.0000 g | PACK | Freq: Every day | ORAL | 0 refills | Status: DC

## 2018-06-24 MED ORDER — PANTOPRAZOLE SODIUM 40 MG PO PACK: 40.0000 mg | PACK | Freq: Two times a day (BID) | ORAL | 1 refills | Status: DC

## 2018-06-24 MED ORDER — ACETAMINOPHEN 500 MG PO TABS: 1000.0000 mg | ORAL_TABLET | Freq: Three times a day (TID) | ORAL | 0 refills | Status: DC | PRN

## 2018-06-24 MED ORDER — OSMOLITE 1.2 CAL PO LIQD: ORAL | 0 refills | Status: DC

## 2018-06-24 MED ORDER — ACETAMINOPHEN 160 MG/5ML PO SOLN: 1000.0000 mg | Freq: Three times a day (TID) | ORAL | 0 refills | Status: DC | PRN

## 2018-06-24 MED ORDER — OSMOLITE 1.2 CAL PO LIQD
ORAL | 0 refills | Status: DC
Start: 2018-06-24 — End: 2023-12-02

## 2018-06-24 MED ORDER — ELDERTONIC PO LIQD: 15.0000 mL | Freq: Every day | ORAL | 1 refills | Status: DC

## 2018-06-24 NOTE — Progress Notes (Signed)
[facility] Progress Note  17 Days Post-Op  Subjective: CC: No complaints Hopeful to go back to [facility] today. Tolerating TF. Had BMs overnight. Denies abdominal pain, just some occasional soreness.   Objective: Vital signs in last 24 hours: Temp:  [97.8 F (36.6 C)] 97.8 F (36.6 C) (07/26 0352) Pulse Rate:  [78] 78 (07/25 1231) Resp:  [16-18] 16 (07/26 0352) SpO2:  [97 %] 97 % (07/25 1231) Weight:  [151 lb 10.8 oz (68.8 kg)] 151 lb 10.8 oz (68.8 kg) (07/26 0352) Last BM Date: 06/14/18  Intake/Output from previous day: 07/25 0701 - 07/26 0700 In: 2276.7 [P.O.:1080; I.V.:1196.7] Out: 2050 [Urine:1400; Drains:650] Intake/Output this shift: No intake/output data recorded.  PE: Gen:  Alert, NAD, pleasant Card:  Regular rate and rhythm, pedal pulses 2+ BL Pulm:  Normal effort, clear to auscultation bilaterally Abd: Soft, non-tender, non-distended, bowel sounds present, no HSM, incisions c/d/i, G tube with 650 out yesterday, J tube for TF  Skin: warm and dry, no rashes    Lab Results:  No results for input(s): WBC, HGB, HCT, PLT in the last 72 hours. BMET Recent Labs    06/23/18 0600  NA 133*  K 4.3  CL 103  CO2 24  GLUCOSE 105*  BUN 23  CREATININE 0.45  CALCIUM 8.7*   PT/INR No results for input(s): LABPROT, INR in the last 72 hours. CMP     Component Value Date/Time   NA 133 (L) 06/23/2018 0600   K 4.3 06/23/2018 0600   CL 103 06/23/2018 0600   CO2 24 06/23/2018 0600   GLUCOSE 105 (H) 06/23/2018 0600   BUN 23 06/23/2018 0600   CREATININE 0.45 06/23/2018 0600   CALCIUM 8.7 (L) 06/23/2018 0600   PROT 5.8 (L) 06/23/2018 0600   ALBUMIN 2.6 (L) 06/23/2018 0600   AST 26 06/23/2018 0600   ALT 37 06/23/2018 0600   ALKPHOS 160 (H) 06/23/2018 0600   BILITOT 0.3 06/23/2018 0600   GFRNONAA >60 06/23/2018 0600   GFRAA >60 06/23/2018 0600   Lipase     Component Value Date/Time   LIPASE 22 06/02/2018 2053       Studies/Results: No  results found.  Anti-infectives: Anti-infectives (From admission, onward)   Start     Dose/Rate Route Frequency Ordered Stop   06/07/18 1415  cefoTEtan (CEFOTAN) 1 g in sodium chloride 0.9 % 100 mL IVPB  Status:  Discontinued     1 g 200 mL/hr over 30 Minutes Intravenous To ShortStay Surgical 06/07/18 1405 06/07/18 1409   06/07/18 1415  cefoTEtan in Dextrose 5% (CEFOTAN) IVPB 2 g  Status:  Discontinued     2 g 100 mL/hr over 30 Minutes Intravenous Every 12 hours 06/07/18 1408 06/07/18 1409   06/07/18 1415  cefoTEtan (CEFOTAN) 2 g in sodium chloride 0.9 % 100 mL IVPB  Status:  Discontinued     2 g 200 mL/hr over 30 Minutes Intravenous To ShortStay Surgical 06/07/18 1410 06/07/18 1851   06/07/18 1400  cefoTEtan in Dextrose 5% (CEFOTAN) IVPB 2 g  Status:  Discontinued     2 g 100 mL/hr over 30 Minutes Intravenous To ShortStay Surgical 06/07/18 1359 06/07/18 1409   06/07/18 0600  cefoTEtan in Dextrose 5% (CEFOTAN) IVPB 2 g  Status:  Discontinued     2 g 100 mL/hr over 30 Minutes Intravenous On call to O.R. 06/06/18 2009 06/07/18 1540   06/03/18 0900  ciprofloxacin (CIPRO) IVPB 400 mg  Status:  Discontinued  400 mg 200 mL/hr over 60 Minutes Intravenous Every 12 hours 06/03/18 0757 06/03/18 0759   06/03/18 0800  cefTRIAXone (ROCEPHIN) 1 g in sodium chloride 0.9 % 100 mL IVPB  Status:  Discontinued     1 g 200 mL/hr over 30 Minutes Intravenous Every 24 hours 06/03/18 0759 06/06/18 1135       Assessment/Plan Chest pain - resolved  UTI - treated with Rocephin  Urinary retention - resolved HTN GERD Diastolic CHF- stable  Nausea and vomiting Type III hiatal hernia S/P LAPAROSCOPIC REPAIR OF HH, LAPAROSCOPIC 50F GASTROSTOMY- 06/07/2018 - D. Newman - POD#17, afebrile, VSS -Post op gastric ileus. IR placed Dahlgren tube 7/22 - dietitian consult. May slowly advance TFs to goal as long as she tolerates.  - Minimize narcotics as  able,scheduled p.o. Tylenol - OOB/mobilize,continuePT/OT- currently recommending SNF - Incentive spirometry  FEN: NPO except ice,GJ, j-tube feed advancement. Goal rate 55 ID: Rocephin 7/5-7/8, perioperative cefotetan 7/9  VTE: SCD's,subcutaneous heparin Foley: in place x5days,removed 7/11.purewick in place currently. Follow up - [place]: continue G tube drainage for ongoing ileus. Tolerating TF and having bowel function. Stable for discharge to SNF when bed available.    LOS: 20 days    Brigid Re , [facility] 06/24/2018, 7:39 AM Pager: [phone] Consults: [phone] Mon-Fri 7:00 am-4:30 pm Sat-Sun 7:00 am-11:30 am

## 2018-06-24 NOTE — Care Management Important Message (Signed)
Important Message  Patient Details  Name: [name] MRN: [id number] Date of Birth: 10/28/1926   Medicare Important Message Given:  Yes    Barb Merino [place] 06/24/2018, 3:34 PM

## 2018-06-24 NOTE — Progress Notes (Signed)
Called [facility], [phone] and gave report on patient to [place]. Explained to [facility] that IV team was removing Patient PICC line, bedrest up at 1520. Patient will leave unit via [facility].

## 2018-06-24 NOTE — Discharge Instructions (Signed)
[facility]      [facility] °[phone] ° °OPEN ABDOMINAL SURGERY: POST OP INSTRUCTIONS ° °Always review your discharge instruction sheet given to you by the facility where your surgery was performed. ° °IF YOU HAVE DISABILITY OR FAMILY LEAVE FORMS, YOU MUST BRING THEM TO THE OFFICE FOR PROCESSING.  PLEASE DO NOT GIVE THEM TO YOUR DOCTOR. ° °1. A prescription for pain medication may be given to you upon discharge.  Take your pain medication as prescribed, if needed.  If narcotic pain medicine is not needed, then you may take acetaminophen (Tylenol) or ibuprofen (Advil) as needed. °2. Take your usually prescribed medications unless otherwise directed. °3. If you need a refill on your pain medication, please contact your pharmacy. They will contact our office to request authorization.  Prescriptions will not be filled after 5pm or on week-ends. °4. You should follow a light diet the first few days after arrival home, such as soup and crackers, pudding, etc.unless your doctor has advised otherwise. A high-fiber, low fat diet can be resumed as tolerated.   Be sure to include lots of fluids daily. Most patients will experience some swelling and bruising on the chest and neck area.  Ice packs will help.  Swelling and bruising can take several days to resolve °5. Most patients will experience some swelling and bruising in the area of the incision. Ice pack will help. Swelling and bruising can take several days to resolve..  °6. It is common to experience some constipation if taking pain medication after surgery.  Increasing fluid intake and taking a stool softener will usually help or prevent this problem from occurring.  A mild laxative (Milk of Magnesia or Miralax) should be taken according to package directions if there are no bowel movements after 48 hours. °7.  You may have steri-strips (small skin tapes) in place directly over the incision.  These strips should be left on the skin for 7-10 days.  If your  surgeon used skin glue on the incision, you may shower in 24 hours.  The glue will flake off over the next 2-3 weeks.  Any sutures or staples will be removed at the office during your follow-up visit. You may find that a light gauze bandage over your incision may keep your staples from being rubbed or pulled. You may shower and replace the bandage daily. °8. ACTIVITIES:  You may resume regular (light) daily activities beginning the next day--such as daily self-care, walking, climbing stairs--gradually increasing activities as tolerated.  You may have sexual intercourse when it is comfortable.  Refrain from any heavy lifting or straining until approved by your doctor. °a. You may drive when you no longer are taking prescription pain medication, you can comfortably wear a seatbelt, and you can safely maneuver your car and apply brakes °b. Return to Work: ___________________________________ °9. You should see your doctor in the office for a follow-up appointment approximately two weeks after your surgery.  Make sure that you call for this appointment within a day or two after you arrive home to insure a convenient appointment time. °OTHER INSTRUCTIONS:  °_____________________________________________________________ °_____________________________________________________________ ° °WHEN TO CALL YOUR DOCTOR: °1. Fever over 101.0 °2. Inability to urinate °3. Nausea and/or vomiting °4. Extreme swelling or bruising °5. Continued bleeding from incision. °6. Increased pain, redness, or drainage from the incision. °7. Difficulty swallowing or breathing °8. Muscle cramping or spasms. °9. Numbness or tingling in hands or feet or around lips. ° °The clinic staff is available to   answer your questions during regular business hours.  Please dont hesitate to call and ask to speak to one of the nurses if you have concerns.  For further questions, please visit [email or web address]   You have a gastrojejunostomy tube.  It  has a feeding port and a port to drain your stomach.   Do not put any crushed pill down the jejunostomy portion of the Gastrojejunostomy tube; ever!!!  Care of a Feeding Tube Feeding tubes are often given to those who have trouble swallowing or cannot take food or medicine. A feeding tube can:  Go into the nose and down to the stomach.  Go through the skin in the belly (abdomen) and into the stomach or small bowel.  Supplies needed to care for the tube site:  Clean gloves.  Clean wash cloth, gauze pads, or soft paper towel.  Cotton swabs.  Skin barrier ointment or cream.  Soap and water.  Precut foam pads or gauze (that go around the tube).  Tube tape. Tube site care 1. Have all supplies ready. 2. Wash hands well. 3. Put on clean gloves. 4. Remove dirty foam pads or gauze near the tube site, if present. 5. Check the skin around the tube site for redness, rash, puffiness (swelling), leaking fluid, or extra tissue growth. Call your doctor if you see any of these. 6. Wet the gauze and cotton swabs with water and soap. 7. Wipe the area closest to the tube with cotton swabs. Wipe the surrounding skin with moistened gauze. Rinse with water. 8. Dry the skin and tube site with a dry gauze pad or soft paper towel. Do not use antibiotic ointments at the tube site. 9. If the skin is red, apply petroleum jelly in a circular motion, using a cotton swab. Your doctor may suggest a different cream or ointment. Use what the doctor suggests. 10. Apply a new pre-cut foam pad or gauze around the tube. Tape the edges down. Foam pads or gauze may be left off if there is no fluid at the tube site. 11. Use tape or a device that will attach your feeding tube to your skin or do as directed. Rotate where you tape the tube. 12. Sit the person up. 13. Throw away used supplies. 14. Remove gloves. 15. Wash hands. Supplies needed to flush a feeding tube:  Clean gloves.  60 mL syringe (that connects to  feeding tube).  Towel.  Water. Flushing a feeding tube 1. Have all supplies ready. 2. Wash hands well. 3. Put on clean gloves. 4. Pull 30 mL of water into the syringe. 5. Bend (kink) the feeding tube while disconnecting it from the feeding-bag tubing or while removing the plug at the end of the tube. 6. Insert the tip of the syringe into the end of the feeding tube. Stop bending the tube. Slowly inject the water. 7. If you cannot inject the water, the person with the feeding tube should lay on their left side. ? Do not use a syringe smaller than 60 mL to flush the tubing. ? Do not inject the water with force. 8. After injecting the water, remove the syringe. 9. Always flush the tube before giving the first medicine, between medicines, and after the final medicine before starting a feeding. ? Do not mix medicines with liquid food (formula) before giving medicines. ? Do not mix medicines with other medicines before giving medicines. ? Completely flush medicines through the tube so they do not mix with the liquid food. 10.  Throw away used supplies. 11. Remove gloves. 12. Wash hands. This information is not intended to replace advice given to you by your health care provider. Make sure you discuss any questions you have with your health care provider. Document Released: 08/10/2012 Document Revised: 04/23/2016 Document Reviewed: 06/30/2012 Elsevier Interactive Patient Education  2017 [employer].   Gastrostomy  Tube portion is to drain your stomach until it starts working again.  Keep this portion to a straight foley bag and allow it to drain your stomach content.   Gastrostomy Tube Home Guide, Adult A gastrostomy tube is a tube that is surgically placed into the stomach. It is also called a G-tube. G-tubes are used when a person is unable to eat and drink enough on their own to stay healthy. The tube is inserted into the stomach through a small cut (incision) in the skin. This tube is  used for:  Feeding.  Giving medication.  Gastrostomy tube care  Wash your hands with soap and water.  Remove the old dressing (if any). Some styles of G-tubes may need a dressing inserted between the skin and the G-tube. Other types of G-tubes do not require a dressing. Ask your health care provider if a dressing is needed.  Check the area where the tube enters the skin (insertion site) for redness, swelling, or pus-like (purulent) drainage. A small amount of clear or tan liquid drainage is normal. Check to make sure scar tissue (skin) is not growing around the insertion site. This could have a raised, bumpy appearance.  A cotton swab can be used to clean the skin around the tube: ? When the G-tube is first put in, a normal saline solution or water can be used to clean the skin. ? Mild soap and warm water can be used when the skin around the G-tube site has healed. ? Roll the cotton swab around the G-tube insertion site to remove any drainage or crusting at the insertion site. Stomach residuals Feeding tube residuals are the amount of liquids that are in the stomach at any given time. Residuals may be checked before giving feedings, medications, or as instructed by your health care provider.  Ask your health care provider if there are instances when you would not start tube feedings depending on the amount or type of contents withdrawn from the stomach.  Check residuals by attaching a syringe to the G-tube and pulling back on the syringe plunger. Note the amount, and return the residual back into the stomach.  Flushing the G-tube  The G-tube should be periodically flushed with clean warm water to keep it from clogging. ? Flush the G-tube after feedings or medications. Draw up 30 mL of warm water in a syringe. Connect the syringe to the G-tube and slowly push the water into the tube. ? Do not push feedings, medications, or flushes rapidly. Flush the G-tube gently and slowly. ? Only use  syringes made for G-tubes to flush medications or feedings. ? Your health care provider may want the G-tube flushed more often or with more water. If this is the case, follow your health care provider's instructions. Feedings Your health care provider will determine whether feedings are given as a bolus (a certain amount given at one time and at scheduled times) or whether feedings will be given continuously on a feeding pump.  Formulas should be given at room temperature.  If feedings are continuous, no more than 4 hours worth of feedings should be placed in the feeding bag.  This helps prevent spoilage or accidental excess infusion.  Cover and place unused formula in the refrigerator.  If feedings are continuous, stop the feedings when medications or flushes are given. Be sure to restart the feedings.  Feeding bags and syringes should be replaced as instructed by your health care provider.  Giving medication  In general, it is best if all medications are in a liquid form for G-tube administration. Liquid medications are less likely to clog the G-tube. ? Mix the liquid medication with 30 mL (or amount recommended by your health care provider) of warm water. ? Draw up the medication into the syringe. ? Attach the syringe to the G-tube and slowly push the mixture into the G-tube. ? After giving the medication, draw up 30 mL of warm water in the syringe and slowly flush the G-tube.  For pills or capsules, check with your health care provider first before crushing medications. Some pills are not effective if they are crushed. Some capsules are sustained-release medications. ? If appropriate, crush the pill or capsule and mix with 30 mL of warm water. Using the syringe, slowly push the medication through the tube, then flush the tube with another 30 mL of tap water. G-tube problems G-tube was pulled out.  Cause: May have been pulled out accidentally.  Solutions: Cover the opening with clean  dressing and tape. Call your health care provider right away. The G-tube should be put in as soon as possible (within 4 hours) so the G-tube opening (tract) does not close. The G-tube needs to be put in at a health care setting. An X-ray needs to be done to confirm placement before the G-tube can be used again.  Redness, irritation, soreness, or foul odor around the gastrostomy site.  Cause: May be caused by leakage or infection.  Solutions: Call your health care provider right away.  Large amount of leakage of fluid or mucus-like liquid present (a large amount means it soaks clothing).  Cause: Many reasons could cause the G-tube to leak.  Solutions: Call your health care provider to discuss the amount of leakage.  Skin or scar tissue appears to be growing where tube enters skin.  Cause: Tissue growth may develop around the insertion site if the G-tube is moved or pulled on excessively.  Solutions: Secure tube with tape so that excess movement does not occur. Call your health care provider.  G-tube is clogged.  Cause: Thick formula or medication.  Solutions: Try to slowly push warm water into the tube with a large syringe. Never try to push any object into the tube to unclog it. Do not force fluid into the G-tube. If you are unable to unclog the tube, call your health care provider right away.  Tips  Head of bed (HOB) position refers to the upright position of a person's upper body. ? When giving medications or a feeding bolus, keep the [facility] up as told by your health care provider. Do this during the feeding and for 1 hour after the feeding or medication administration. ? If continuous feedings are being given, it is best to keep the [facility] up as told by your health care provider. When ADLs (activities of daily living) are performed and the [facility] needs to be flat, be sure to turn the feeding pump off. Restart the feeding pump when the [facility] is returned to the recommended height.  Do not pull  or put tension on the tube.  To prevent fluid backflow, kink the G-tube before removing  the cap or disconnecting a syringe.  Check the G-tube length every day. Measure from the insertion site to the end of the G-tube. If the length is longer than previous measurements, the tube may be coming out. Call your health care provider if you notice increasing G-tube length.  Oral care, such as brushing teeth, must be continued.  You may need to remove excess air (vent) from the G-tube. Your health care provider will tell you if this is needed.  Always call your health care provider if you have questions or problems with the G-tube. Get help right away if:  You have severe abdominal pain, tenderness, or abdominal bloating (distension).  You have nausea or vomiting.  You are constipated or have problems moving your bowels.  The G-tube insertion site is red, swollen, has a foul smell, or has yellow or brown drainage.  You have difficulty breathing or shortness of breath.  You have a fever.  You have a large amount of feeding tube residuals.  The G-tube is clogged and cannot be flushed. This information is not intended to replace advice given to you by your health care provider. Make sure you discuss any questions you have with your health care provider. Document Released: 01/25/2002 Document Revised: 04/23/2016 Document Reviewed: 07/24/2013 Elsevier Interactive Patient Education  2017 [employer].

## 2018-06-24 NOTE — Progress Notes (Signed)
Patient discharged with PTAR. Patient has all of her belongings. She is alert and oriented. IV's have been DC'd and PICC Line has been DC'd. Paperwork sent with PTAR

## 2018-06-24 NOTE — Care Management Note (Signed)
Case Management Note Marvetta Gibbons RN, BSN Unit 4E-Case Manager [phone]  Patient Details  Name: [name] MRN: [id number] Date of Birth: 12-Nov-1926  Subjective/Objective:  Pt admitted with abd pain, large hiatal hernia found, s/p laparoscopic hernia repair 06/07/18. Coarse complicated by post op gastric ileus unable to advance diet- GJ tube 7/22 per IR. Pt has been started on TF and tolerating, TPN has been weaned off.                   Action/Plan: PTA pt was from [facility] SNF, plan is to return to SNF, CSW following for transition needs back to SNF.   Expected Discharge Date:                  Expected Discharge Plan:  Skilled Nursing Facility([facility])  In-House Referral:  Clinical Social Work  Discharge planning Services  CM Consult  Post Acute Care Choice:    Choice offered to:     DME Arranged:    DME Agency:     HH Arranged:    [place] Agency:     Status of Service:  Completed, signed off  If discussed at [employer], dates discussed:    Discharge Disposition: skilled facility   Additional Comments:  06/24/18- 1000- Tanasia Budzinski RN, CM- pt stable for transition back to SNF today, CSW to follow for transition needs and return to [facility].   Dawayne Patricia, RN 06/24/2018, 10:02 AM

## 2018-06-24 NOTE — Progress Notes (Signed)
Physical Therapy Treatment Patient Details Name: [name] MRN: [id number] DOB: 19-Nov-1926 Today's Date: 06/24/2018    History of Present Illness [name] presented from SNF on 06/02/18 with intractable nausea, vomiting, and abdominal/chest pain.  Patient has a history of a large hiatal hernia but is not a good surgical candidate.  She started to have coffee-ground emesis 06/04/18 and EGD showed no evidence of ulcer or active bleeding. Pt underwent  LAPAROSCOPIC REPAIR OF HH, LAPAROSCOPIC 66F GASTROSTOMY     PT Comments    Pt admitted with above diagnosis. Pt currently with functional limitations due to the deficits listed below (see PT Problem List).Pt was able to stand with Stedy with min assist.  Used Stedy to move pt to chair.  Pt did well today.  Met 3/4 goals.  Revised goals.   Pt will benefit from skilled PT to increase their independence and safety with mobility to allow discharge to the venue listed below.     Follow Up Recommendations  SNF;Supervision/Assistance - 24 hour     Equipment Recommendations  None recommended by PT    Recommendations for Other Services       Precautions / Restrictions Precautions Precautions: Fall Precaution Comments: gastrostomy tube Restrictions Weight Bearing Restrictions: No    Mobility  Bed Mobility Overal bed mobility: Needs Assistance Bed Mobility: Supine to Sit Rolling: Min assist Sidelying to sit: Min assist Supine to sit: Min assist     General bed mobility comments: Pt able to come to EOB on her own with incr time  Transfers Overall transfer level: Needs assistance Equipment used: Ambulation equipment used Transfers: Sit to/from Stand Sit to Stand: Min guard Stand pivot transfers: (Using [place])       General transfer comment: No assist needed to stand to [place] once cued pt to reach for bar on Stedy. Used STedy to move pt to recliner.   Ambulation/Gait                 Stairs             Wheelchair  Mobility    Modified Rankin (Stroke Patients Only)       Balance Overall balance assessment: Needs assistance Sitting-balance support: Feet supported;No upper extremity supported Sitting balance-[name]: Fair Sitting balance - Comments: Did not need UE support and was able to sit with supervision   Standing balance support: Bilateral upper extremity supported Standing balance-[name]: Poor Standing balance comment: Pulled up on Stedy. Stood with 1 1/2 minutes with min guard assist x 2.                            Cognition Arousal/Alertness: Awake/alert Behavior During Therapy: WFL for tasks assessed/performed Overall Cognitive Status: Within Functional Limits for tasks assessed                                        Exercises General Exercises - Lower Extremity Ankle Circles/Pumps: AROM;Both;Supine;10 reps Long Arc Quad: AROM;Both;10 reps;Seated    General Comments        Pertinent Vitals/Pain Pain Assessment: No/denies pain    Home Living                      Prior Function            PT Goals (current goals can now be  found in the care plan section) Acute Rehab PT Goals PT Goal Formulation: With patient Time For Goal Achievement: 07/06/18 Potential to Achieve Goals: Good Progress towards PT goals: Progressing toward goals    Frequency    Min 2X/week      PT Plan Current plan remains appropriate    Co-evaluation              AM-PAC PT "6 Clicks" Daily Activity  Outcome Measure  Difficulty turning over in bed (including adjusting bedclothes, sheets and blankets)?: None Difficulty moving from lying on back to sitting on the side of the bed? : None Difficulty sitting down on and standing up from a chair with arms (e.g., wheelchair, bedside commode, etc,.)?: A Lot Help needed moving to and from a bed to chair (including a wheelchair)?: A Lot Help needed walking in hospital room?: Total Help needed  climbing 3-5 steps with a railing? : Total 6 Click Score: 14    End of Session Equipment Utilized During Treatment: Gait belt Activity Tolerance: Patient limited by fatigue Patient left: with call bell/phone within reach;in chair;with chair alarm set Nurse Communication: Mobility status;Need for lift equipment(Stedy) PT Visit Diagnosis: Unsteadiness on feet (R26.81);Muscle weakness (generalized) (M62.81)     Time:  -1212    Charges:  $Therapeutic Activity: 8-22 mins                     [place] [phone] (pager)    Denice Paradise 06/24/2018, 3:34 PM

## 2018-06-24 NOTE — Progress Notes (Signed)
Received new order for OT. Pt originally evaled on 7/11 with noted that pta pt was dependent with basic ADLs of B/D/toileting but could feed self and do some grooming and that pt was close to baseline. Per chart pt for probable return to [facility] today--will not re-eval at this time but defer OT to SNF. [place], [place] [phone] 06/24/2018

## 2018-06-24 NOTE — Progress Notes (Addendum)
Patient will DC to: [facility] Anticipated DC date: 06/24/18 Family notified: Daughter, Animal nutritionist by: Domenica Reamer   Per MD patient ready for DC to [facility]. RN, patient, patient's family, and facility notified of DC. Discharge Summary sent to facility. RN given number for report [phone]). DC packet on chart. Ambulance transport scheduled.   CSW signing off.  Cedric Fishman, LCSW Clinical Social Worker [phone]

## 2018-06-29 DIAGNOSIS — K449 Diaphragmatic hernia without obstruction or gangrene: Secondary | ICD-10-CM | POA: Diagnosis not present

## 2018-06-29 DIAGNOSIS — N390 Urinary tract infection, site not specified: Secondary | ICD-10-CM | POA: Diagnosis not present

## 2018-06-29 DIAGNOSIS — K219 Gastro-esophageal reflux disease without esophagitis: Secondary | ICD-10-CM | POA: Diagnosis not present

## 2018-06-29 DIAGNOSIS — I509 Heart failure, unspecified: Secondary | ICD-10-CM | POA: Diagnosis not present

## 2018-06-29 DIAGNOSIS — I10 Essential (primary) hypertension: Secondary | ICD-10-CM | POA: Diagnosis not present

## 2018-06-29 DIAGNOSIS — E785 Hyperlipidemia, unspecified: Secondary | ICD-10-CM | POA: Diagnosis not present

## 2018-06-30 ENCOUNTER — Other Ambulatory Visit: Payer: Self-pay | Admitting: *Deleted

## 2018-06-30 NOTE — Patient Outreach (Signed)
[facility]) Care Management  06/30/2018  [name] 11-Oct-1926 [id number]   Met with Vickii Chafe, SW at facility. She reports that patient is a LTC resident of the facility. No plans to discharge. NO [facility] Care management needs identified.  Plan to sign off. Royetta Crochet. Laymond Purser, RN, BSN, [place] [phone]) Business Cell  [phone]) Toll Free Office

## 2018-07-05 DIAGNOSIS — I10 Essential (primary) hypertension: Secondary | ICD-10-CM | POA: Diagnosis not present

## 2018-07-05 DIAGNOSIS — D6489 Other specified anemias: Secondary | ICD-10-CM | POA: Diagnosis not present

## 2018-07-05 DIAGNOSIS — K567 Ileus, unspecified: Secondary | ICD-10-CM | POA: Diagnosis not present

## 2018-07-05 DIAGNOSIS — K449 Diaphragmatic hernia without obstruction or gangrene: Secondary | ICD-10-CM | POA: Diagnosis not present

## 2018-07-17 ENCOUNTER — Other Ambulatory Visit: Payer: Self-pay

## 2018-07-17 ENCOUNTER — Encounter (HOSPITAL_COMMUNITY): Payer: Self-pay | Admitting: *Deleted

## 2018-07-17 ENCOUNTER — Inpatient Hospital Stay (HOSPITAL_COMMUNITY)
Admission: EM | Admit: 2018-07-17 | Discharge: 2018-07-20 | DRG: 872 | Disposition: A | Discharge status: Skilled Nursing Facility | Payer: Medicare Other | Attending: Family Medicine | Admitting: Family Medicine

## 2018-07-17 ENCOUNTER — Emergency Department (HOSPITAL_COMMUNITY): Payer: Medicare Other

## 2018-07-17 DIAGNOSIS — R278 Other lack of coordination: Secondary | ICD-10-CM | POA: Diagnosis not present

## 2018-07-17 DIAGNOSIS — Z882 Allergy status to sulfonamides status: Secondary | ICD-10-CM

## 2018-07-17 DIAGNOSIS — S63592A Other specified sprain of left wrist, initial encounter: Secondary | ICD-10-CM | POA: Diagnosis present

## 2018-07-17 DIAGNOSIS — Z66 Do not resuscitate: Secondary | ICD-10-CM | POA: Diagnosis present

## 2018-07-17 DIAGNOSIS — M2550 Pain in unspecified joint: Secondary | ICD-10-CM | POA: Diagnosis not present

## 2018-07-17 DIAGNOSIS — N390 Urinary tract infection, site not specified: Secondary | ICD-10-CM | POA: Diagnosis present

## 2018-07-17 DIAGNOSIS — M25512 Pain in left shoulder: Secondary | ICD-10-CM | POA: Diagnosis not present

## 2018-07-17 DIAGNOSIS — R509 Fever, unspecified: Secondary | ICD-10-CM | POA: Diagnosis not present

## 2018-07-17 DIAGNOSIS — R079 Chest pain, unspecified: Secondary | ICD-10-CM | POA: Diagnosis not present

## 2018-07-17 DIAGNOSIS — H3530 Unspecified macular degeneration: Secondary | ICD-10-CM | POA: Diagnosis present

## 2018-07-17 DIAGNOSIS — D638 Anemia in other chronic diseases classified elsewhere: Secondary | ICD-10-CM | POA: Diagnosis present

## 2018-07-17 DIAGNOSIS — L570 Actinic keratosis: Secondary | ICD-10-CM | POA: Diagnosis present

## 2018-07-17 DIAGNOSIS — M6281 Muscle weakness (generalized): Secondary | ICD-10-CM | POA: Diagnosis not present

## 2018-07-17 DIAGNOSIS — M79602 Pain in left arm: Secondary | ICD-10-CM | POA: Diagnosis not present

## 2018-07-17 DIAGNOSIS — Z96622 Presence of left artificial elbow joint: Secondary | ICD-10-CM | POA: Diagnosis present

## 2018-07-17 DIAGNOSIS — M810 Age-related osteoporosis without current pathological fracture: Secondary | ICD-10-CM | POA: Diagnosis present

## 2018-07-17 DIAGNOSIS — Z993 Dependence on wheelchair: Secondary | ICD-10-CM

## 2018-07-17 DIAGNOSIS — M25532 Pain in left wrist: Secondary | ICD-10-CM | POA: Diagnosis present

## 2018-07-17 DIAGNOSIS — R2689 Other abnormalities of gait and mobility: Secondary | ICD-10-CM | POA: Diagnosis not present

## 2018-07-17 DIAGNOSIS — R319 Hematuria, unspecified: Secondary | ICD-10-CM | POA: Diagnosis not present

## 2018-07-17 DIAGNOSIS — Z885 Allergy status to narcotic agent status: Secondary | ICD-10-CM

## 2018-07-17 DIAGNOSIS — J309 Allergic rhinitis, unspecified: Secondary | ICD-10-CM | POA: Diagnosis present

## 2018-07-17 DIAGNOSIS — I110 Hypertensive heart disease with heart failure: Secondary | ICD-10-CM | POA: Diagnosis present

## 2018-07-17 DIAGNOSIS — Z431 Encounter for attention to gastrostomy: Secondary | ICD-10-CM | POA: Diagnosis not present

## 2018-07-17 DIAGNOSIS — B9620 Unspecified Escherichia coli [E. coli] as the cause of diseases classified elsewhere: Secondary | ICD-10-CM | POA: Diagnosis present

## 2018-07-17 DIAGNOSIS — Z87891 Personal history of nicotine dependence: Secondary | ICD-10-CM

## 2018-07-17 DIAGNOSIS — Z7401 Bed confinement status: Secondary | ICD-10-CM | POA: Diagnosis not present

## 2018-07-17 DIAGNOSIS — E162 Hypoglycemia, unspecified: Secondary | ICD-10-CM | POA: Diagnosis not present

## 2018-07-17 DIAGNOSIS — Z1611 Resistance to penicillins: Secondary | ICD-10-CM | POA: Diagnosis present

## 2018-07-17 DIAGNOSIS — I340 Nonrheumatic mitral (valve) insufficiency: Secondary | ICD-10-CM | POA: Diagnosis present

## 2018-07-17 DIAGNOSIS — K219 Gastro-esophageal reflux disease without esophagitis: Secondary | ICD-10-CM | POA: Diagnosis present

## 2018-07-17 DIAGNOSIS — M455 Ankylosing spondylitis of thoracolumbar region: Secondary | ICD-10-CM | POA: Diagnosis not present

## 2018-07-17 DIAGNOSIS — Z8249 Family history of ischemic heart disease and other diseases of the circulatory system: Secondary | ICD-10-CM | POA: Diagnosis not present

## 2018-07-17 DIAGNOSIS — M1990 Unspecified osteoarthritis, unspecified site: Secondary | ICD-10-CM | POA: Diagnosis not present

## 2018-07-17 DIAGNOSIS — S638X2D Sprain of other part of left wrist and hand, subsequent encounter: Secondary | ICD-10-CM | POA: Diagnosis not present

## 2018-07-17 DIAGNOSIS — Z79899 Other long term (current) drug therapy: Secondary | ICD-10-CM | POA: Diagnosis not present

## 2018-07-17 DIAGNOSIS — E785 Hyperlipidemia, unspecified: Secondary | ICD-10-CM | POA: Diagnosis present

## 2018-07-17 DIAGNOSIS — I5032 Chronic diastolic (congestive) heart failure: Secondary | ICD-10-CM | POA: Diagnosis present

## 2018-07-17 DIAGNOSIS — K440 Diaphragmatic hernia with obstruction, without gangrene: Secondary | ICD-10-CM | POA: Diagnosis not present

## 2018-07-17 DIAGNOSIS — Z931 Gastrostomy status: Secondary | ICD-10-CM | POA: Diagnosis not present

## 2018-07-17 DIAGNOSIS — Z96621 Presence of right artificial elbow joint: Secondary | ICD-10-CM | POA: Diagnosis present

## 2018-07-17 DIAGNOSIS — Z96653 Presence of artificial knee joint, bilateral: Secondary | ICD-10-CM | POA: Diagnosis present

## 2018-07-17 DIAGNOSIS — M25522 Pain in left elbow: Secondary | ICD-10-CM | POA: Diagnosis not present

## 2018-07-17 DIAGNOSIS — E871 Hypo-osmolality and hyponatremia: Secondary | ICD-10-CM | POA: Diagnosis present

## 2018-07-17 DIAGNOSIS — Z96611 Presence of right artificial shoulder joint: Secondary | ICD-10-CM | POA: Diagnosis present

## 2018-07-17 DIAGNOSIS — G4733 Obstructive sleep apnea (adult) (pediatric): Secondary | ICD-10-CM | POA: Diagnosis present

## 2018-07-17 DIAGNOSIS — A419 Sepsis, unspecified organism: Secondary | ICD-10-CM | POA: Diagnosis present

## 2018-07-17 DIAGNOSIS — Z888 Allergy status to other drugs, medicaments and biological substances status: Secondary | ICD-10-CM

## 2018-07-17 DIAGNOSIS — I10 Essential (primary) hypertension: Secondary | ICD-10-CM | POA: Diagnosis not present

## 2018-07-17 DIAGNOSIS — Z886 Allergy status to analgesic agent status: Secondary | ICD-10-CM

## 2018-07-17 DIAGNOSIS — Z880 Allergy status to penicillin: Secondary | ICD-10-CM

## 2018-07-17 DIAGNOSIS — K5909 Other constipation: Secondary | ICD-10-CM | POA: Diagnosis present

## 2018-07-17 DIAGNOSIS — M79603 Pain in arm, unspecified: Secondary | ICD-10-CM | POA: Diagnosis not present

## 2018-07-17 DIAGNOSIS — R739 Hyperglycemia, unspecified: Secondary | ICD-10-CM | POA: Diagnosis present

## 2018-07-17 LAB — URINALYSIS, ROUTINE W REFLEX MICROSCOPIC
BILIRUBIN URINE: NEGATIVE
GLUCOSE, UA: NEGATIVE mg/dL
HGB URINE DIPSTICK: NEGATIVE
Ketones, ur: NEGATIVE mg/dL
Nitrite: NEGATIVE
PROTEIN: 30 mg/dL — AB
Specific Gravity, Urine: 1.015 (ref 1.005–1.030)
pH: 7 (ref 5.0–8.0)

## 2018-07-17 LAB — COMPREHENSIVE METABOLIC PANEL
ALT: 23 U/L (ref 0–44)
ANION GAP: 10 (ref 5–15)
AST: 22 U/L (ref 15–41)
Albumin: 3.2 g/dL — ABNORMAL LOW (ref 3.5–5.0)
Alkaline Phosphatase: 111 U/L (ref 38–126)
BUN: 16 mg/dL (ref 8–23)
CHLORIDE: 91 mmol/L — AB (ref 98–111)
CO2: 30 mmol/L (ref 22–32)
CREATININE: 0.53 mg/dL (ref 0.44–1.00)
Calcium: 8.9 mg/dL (ref 8.9–10.3)
GFR calc Af Amer: >60 mL/min (ref >60)
Glucose, Bld: 127 mg/dL — ABNORMAL HIGH (ref 70–99)
POTASSIUM: 4.1 mmol/L (ref 3.5–5.1)
Sodium: 131 mmol/L — ABNORMAL LOW (ref 135–145)
Total Bilirubin: 0.4 mg/dL (ref 0.3–1.2)
Total Protein: 7.5 g/dL (ref 6.5–8.1)

## 2018-07-17 LAB — CBC WITH DIFFERENTIAL/PLATELET
Basophils Absolute: 0 10*3/uL (ref 0.0–0.1)
Basophils Relative: 0 %
EOS ABS: 0.1 10*3/uL (ref 0.0–0.7)
EOS PCT: 1 %
HCT: 33.5 % — ABNORMAL LOW (ref 36.0–46.0)
Hemoglobin: 10.9 g/dL — ABNORMAL LOW (ref 12.0–15.0)
LYMPHS ABS: 1 10*3/uL (ref 0.7–4.0)
LYMPHS PCT: 7 %
MCH: 27.5 pg (ref 26.0–34.0)
MCHC: 32.5 g/dL (ref 30.0–36.0)
MCV: 84.6 fL (ref 78.0–100.0)
MONO ABS: 2 10*3/uL — AB (ref 0.1–1.0)
Monocytes Relative: 15 %
Neutro Abs: 10.8 10*3/uL — ABNORMAL HIGH (ref 1.7–7.7)
Neutrophils Relative %: 77 %
PLATELETS: 570 10*3/uL — AB (ref 150–400)
RBC: 3.96 MIL/uL (ref 3.87–5.11)
RDW: 13.4 % (ref 11.5–15.5)
WBC: 13.9 10*3/uL — ABNORMAL HIGH (ref 4.0–10.5)

## 2018-07-17 LAB — LACTIC ACID, PLASMA: LACTIC ACID, VENOUS: 0.9 mmol/L (ref 0.5–1.9)

## 2018-07-17 LAB — I-STAT CG4 LACTIC ACID, ED: Lactic Acid, Venous: 1.24 mmol/L (ref 0.5–1.9)

## 2018-07-17 LAB — SEDIMENTATION RATE: Sed Rate: 94 mm/h — ABNORMAL HIGH (ref 0–22)

## 2018-07-17 MED ORDER — ONDANSETRON HCL 4 MG PO TABS: 4.0000 mg | ORAL_TABLET | Freq: Four times a day (QID) | ORAL | Status: DC | PRN

## 2018-07-17 MED ORDER — FREE WATER
150.0000 mL | Freq: Four times a day (QID) | Status: DC
  Administered 2018-07-18 – 2018-07-20 (×11): 150 mL

## 2018-07-17 MED ORDER — SODIUM CHLORIDE 0.9% FLUSH
3.0000 mL | Freq: Two times a day (BID) | INTRAVENOUS | Status: DC
  Administered 2018-07-17 – 2018-07-20 (×6): 3 mL via INTRAVENOUS

## 2018-07-17 MED ORDER — ONDANSETRON HCL 4 MG/2ML IJ SOLN: 4.0000 mg | Freq: Four times a day (QID) | INTRAMUSCULAR | Status: DC | PRN

## 2018-07-17 MED ORDER — PANTOPRAZOLE SODIUM 40 MG PO PACK
40.0000 mg | PACK | Freq: Every day | ORAL | Status: DC
  Administered 2018-07-18 – 2018-07-20 (×3): 40 mg
  Filled 2018-07-17 (×4): qty 20

## 2018-07-17 MED ORDER — ELDERTONIC PO LIQD
15.0000 mL | Freq: Every day | ORAL | Status: DC
  Administered 2018-07-18 – 2018-07-20 (×3): 15 mL
  Filled 2018-07-17 (×3): qty 15

## 2018-07-17 MED ORDER — OSMOLITE 1.2 CAL PO LIQD
1000.0000 mL | ORAL | Status: DC
  Administered 2018-07-17 – 2018-07-19 (×3): 1000 mL
  Filled 2018-07-17 (×5): qty 1000

## 2018-07-17 MED ORDER — ALBUTEROL SULFATE (2.5 MG/3ML) 0.083% IN NEBU: 2.5000 mg | INHALATION_SOLUTION | Freq: Four times a day (QID) | RESPIRATORY_TRACT | Status: DC | PRN

## 2018-07-17 MED ORDER — SODIUM CHLORIDE 0.9 % IV BOLUS
500.0000 mL | Freq: Once | INTRAVENOUS | Status: AC
  Administered 2018-07-17: 500 mL via INTRAVENOUS

## 2018-07-17 MED ORDER — ENOXAPARIN SODIUM 40 MG/0.4ML ~~LOC~~ SOLN
40.0000 mg | SUBCUTANEOUS | Status: DC
  Filled 2018-07-17 (×3): qty 0.4

## 2018-07-17 MED ORDER — SODIUM CHLORIDE 0.9 % IV SOLN
1.0000 g | INTRAVENOUS | Status: DC
  Administered 2018-07-18 – 2018-07-19 (×2): 1 g via INTRAVENOUS
  Filled 2018-07-17: qty 1
  Filled 2018-07-17: qty 10
  Filled 2018-07-17: qty 1

## 2018-07-17 MED ORDER — ACETAMINOPHEN 160 MG/5ML PO SOLN
960.0000 mg | Freq: Three times a day (TID) | ORAL | Status: DC | PRN
  Administered 2018-07-18 – 2018-07-19 (×2): 960 mg via ORAL
  Filled 2018-07-17 (×2): qty 40.6

## 2018-07-17 MED ORDER — OXYCODONE HCL 5 MG/5ML PO SOLN
5.0000 mg | Freq: Four times a day (QID) | ORAL | Status: DC | PRN
  Administered 2018-07-17 – 2018-07-20 (×7): 5 mg
  Filled 2018-07-17 (×7): qty 5

## 2018-07-17 MED ORDER — SODIUM CHLORIDE 0.9 % IV SOLN
1.0000 g | Freq: Once | INTRAVENOUS | Status: AC
  Administered 2018-07-17: 1 g via INTRAVENOUS
  Filled 2018-07-17: qty 10

## 2018-07-17 MED ORDER — POLYETHYLENE GLYCOL 3350 17 G PO PACK: 17.0000 g | PACK | Freq: Every day | ORAL | Status: DC | PRN

## 2018-07-17 NOTE — ED Provider Notes (Addendum)
[facility] DEPT Provider Note   CSN: [id number] Arrival date & time: 07/17/18  1635     History   Chief Complaint No chief complaint on file.   HPI [name] is a 84 y.o. female.  HPI  84 yo female transported here from [facility] after hospital admission there after hospitalization presents today with left wrist and elbow fx.  Patient states pain began w days ago and x-rays obtained today showed fx.  Patient does not recall any injury.  X-Domonic Kimball report state oblique fx radial head indeterminate dating.    Past Medical History:  Diagnosis Date  . Actinic keratosis   . Allergic rhinitis   . Back pain   . Chronic constipation   . Chronic diastolic CHF (congestive heart failure) ([place]) 06/03/2018  . GERD (gastroesophageal reflux disease)   . Hyperlipidemia   . Hypertension   . Hyponatremia   . Leg edema   . Macular degeneration   . Mitral regurgitation    mild by echo 2003  . Osteoarthritis   . Osteoarthrosis   . Osteoporosis   . Tenosynovitis    myxomatous    Patient Active Problem List   Diagnosis Date Noted  . Acute lower UTI 06/08/2018  . Acute urinary retention 06/08/2018  . Gastrostomy tube in place ([place]) 06/07/2018  . Incarcerated hiatal hernia s/p repair/Gtube 06/07/2018 06/03/2018  . GERD (gastroesophageal reflux disease) 06/03/2018  . Chronic diastolic CHF (congestive heart failure) ([place]) 06/03/2018  . Acetaminophen overdose of undetermined intent 03/19/2018  . Chronic lower back pain 03/19/2018  . Acetaminophen overdose, intentional self-harm, initial encounter ([place]) 03/19/2018  . Constipation   . Generalized abdominal pain   . OSA (obstructive sleep apnea) 10/06/2016  . UTI (urinary tract infection) 05/06/2014  . Essential hypertension, benign 05/06/2014  . Humerus fracture 05/06/2014  . Dizziness 05/05/2014  . Fall 05/05/2014  . External hemorrhoid, thrombosed-left 01/24/2013  . Acute blood loss anemia 07/01/2012    . S/P left TH revision 06/28/2012  . Hyponatremia 06/11/2012  . Hip fx ([place]) 06/09/2012  . MITRAL VALVE DISORDERS 02/10/2010  . MURMUR 01/16/2010  . GERD 01/15/2010  . Osteoarthritis 01/15/2010  . BACK PAIN, CHRONIC 01/15/2010  . OSTEOPOROSIS 01/15/2010  . CHEST DISCOMFORT 01/15/2010  . GASTROINTESTINAL HEMORRHAGE, HX OF 01/15/2010    Past Surgical History:  Procedure Laterality Date  . CARPAL TUNNEL RELEASE     bilateral  . ESOPHAGOGASTRODUODENOSCOPY (EGD) WITH PROPOFOL N/A 06/05/2018   Procedure: ESOPHAGOGASTRODUODENOSCOPY (EGD) WITH PROPOFOL;  Surgeon: Otis Brace, MD;  Location: [facility];  Service: Gastroenterology;  Laterality: N/A;  . hernial repain    . HIATAL HERNIA REPAIR N/A 06/07/2018   Procedure: LAPAROSCOPIC REPAIR OF HIATAL HERNIA;  Surgeon: Alphonsa Overall, MD;  Location: [facility];  Service: General;  Laterality: N/A;  . INGUINAL HERNIA REPAIR     laparoscopic preperitoneal repair of bilateral inguinal hernias  . IR GJ TUBE CHANGE  06/20/2018  . LAPAROSCOPIC GASTROSTOMY N/A 06/07/2018   Procedure: LAPAROSCOPIC GASTROSTOMY;  Surgeon: Alphonsa Overall, MD;  Location: [facility];  Service: General;  Laterality: N/A;  . REPLACEMENT TOTAL KNEE BILATERAL    . REVERSE SHOULDER ARTHROPLASTY Right 05/08/2014   Procedure: REVERSE SHOULDER ARTHROPLASTY RIGHT ;  Surgeon: Marin Shutter, MD;  Location: [facility];  Service: Orthopedics;  Laterality: Right;  . SYNOVECTOMY     of right long finger  . tonisellectomy    . tonisillectomy    . TONSILLECTOMY       OB  History   None      Home Medications    Prior to Admission medications   Medication Sig Start Date End Date Taking? Authorizing Provider  acetaminophen (TYLENOL) 160 MG/5ML solution Take 31.3 mLs (1,000 mg total) by mouth every 8 (eight) hours as needed. 06/24/18   Earnstine Regal, PA-C  estradiol (ESTRACE) 0.1 MG/GM vaginal cream Place 1 Applicatorful vaginally 2 (two) times a week. Mondays and Thursdays    [provider]  geriatric multivitamins-minerals (ELDERTONIC/GEVRABON) LIQD Place 15 mLs into feeding tube daily. 06/24/18   Earnstine Regal, PA-C  Nutritional Supplements (FEEDING SUPPLEMENT, OSMOLITE 1.2 CAL,) LIQD Continue tube feeding at 55 ml/hr with adjustments per your dietician at the [facility] 06/24/18   Earnstine Regal, PA-C  pantoprazole sodium (PROTONIX) 40 mg/20 mL PACK Place 20 mLs (40 mg total) into feeding tube 2 (two) times daily. 06/24/18 07/24/18  Earnstine Regal, PA-C  polyethylene glycol [facility] / Floria Raveling) packet Take 17 g by mouth daily. 06/24/18   Earnstine Regal, PA-C    Family History Family History  Problem Relation Age of Onset  . Heart attack Father   . Heart attack Brother     Social History Social History   Tobacco Use  . Smoking status: Former Smoker    Packs/day: 1.00    Years: 2.00    Pack years: 2.00    Types: Cigarettes    Last attempt to quit: 12/07/1967    Years since quitting: 50.6  . Smokeless tobacco: Never Used  Substance Use Topics  . Alcohol use: Yes    Alcohol/week: 2.0 standard drinks    Types: 2 Glasses of wine per week    Comment: 2 glasses per week  . Drug use: No     Allergies   Aspirin; Boniva [ibandronic acid]; Morphine; Nitrofuran derivatives; Penicillins; and Sulfonamide derivatives   Review of Systems Review of Systems  Respiratory: Positive for cough.   Gastrointestinal: Negative for diarrhea and vomiting.     Physical Exam Updated Vital Signs There were no vitals taken for this visit.  Physical Exam  Constitutional: She appears well-developed and well-nourished. No distress.  HENT:  Head: Normocephalic and atraumatic.  Right Ear: External ear normal.  Left Ear: External ear normal.  Eyes: Pupils are equal, round, and reactive to light.  Neck: Normal range of motion.  Cardiovascular: Normal rate.  Pulmonary/Chest: Effort normal.  Abdominal: Soft.  Musculoskeletal:  No ttp left elbow Full arom Mild ttp  left wrist Full arom Pulses and sensation intact  Skin: Skin is warm. Capillary refill takes less than 2 seconds.  Nursing note and vitals reviewed.    ED Treatments / Results  Labs (all labs ordered are listed, but only abnormal results are displayed) Labs Reviewed - No data to display  EKG EKG Interpretation  Date/Time:  Sunday July 17 2018 17:07:16 EDT Ventricular Rate:  91 PR Interval:    QRS Duration: 86 QT Interval:  347 QTC Calculation: 427 R Axis:   -35 Text Interpretation:  Sinus rhythm Ventricular premature complex Left axis deviation Low voltage, extremity and precordial leads Confirmed by Pattricia Boss [phone]) on 07/17/2018 5:44:43 PM   Radiology Dg Chest 1 View  Result Date: 07/17/2018 CLINICAL DATA:  Pain EXAM: CHEST  1 VIEW COMPARISON:  June 02, 2018 FINDINGS: There is no edema or consolidation. Heart size and pulmonary vascularity are within normal limits. There is upper thoracic levoscoliosis with lower thoracic dextroscoliosis. There is a total shoulder replacement on the right. There is  aortic atherosclerosis. IMPRESSION: No edema or consolidation. Stable cardiac silhouette. There is aortic atherosclerosis. Aortic Atherosclerosis (ICD10-I70.0). Electronically Signed   By: Lowella Grip III M.D.   On: 07/17/2018 18:51   Dg Elbow Complete Left  Result Date: 07/17/2018 CLINICAL DATA:  Pain EXAM: LEFT ELBOW - COMPLETE 3+ VIEW COMPARISON:  None. FINDINGS: Frontal, lateral, and bilateral oblique views were obtained. There is evidence of an apparent old fracture of the proximal radial metaphysis with remodeling. There is no acute fracture evident. No dislocation. No joint effusion. There is moderate osteoarthritic change. There is spurring along the coracoid process of the proximal ulna. No erosive change. IMPRESSION: Old healed fracture with remodeling involving the proximal radial metaphysis. Moderate osteoarthritic change. No acute fracture or dislocation. No  evident joint effusion. Electronically Signed   By: Lowella Grip III M.D.   On: 07/17/2018 18:54   Dg Wrist Complete Left  Result Date: 07/17/2018 CLINICAL DATA:  Pain EXAM: LEFT WRIST - COMPLETE 3+ VIEW COMPARISON:  Left hand August 31, 2004 FINDINGS: Frontal, oblique, lateral, and ulnar deviation scaphoid images were obtained. There is no acute fracture or dislocation. There is evidence of old trauma involving the distal scaphoid with remodeling. There is advanced arthropathy in the scaphotrapezial and first carpal-metacarpal joints with remodeling in these areas. There is also osteoarthritic change in the radiocarpal joint. No frank erosion. There is calcification in the triangular fibrocartilage region. IMPRESSION: No acute fracture or dislocation. Old trauma involving the distal scaphoid. Advanced arthropathy in the lateral wrist joint region. There is also osteoarthritic change in the radiocarpal joint. There is calcification in the triangular fibrocartilage region. Question chondrocalcinosis versus calcification due to chronic triangular fibrocartilage tear. Electronically Signed   By: Lowella Grip III M.D.   On: 07/17/2018 18:53   Dg Shoulder Left  Result Date: 07/17/2018 CLINICAL DATA:  Pain EXAM: LEFT SHOULDER - 2+ VIEW COMPARISON:  None. FINDINGS: Internal rotation, external rotation, and Y scapular images were obtained. There is no appreciable acute fracture or dislocation. There is diffuse joint space narrowing with superior migration of the humeral head. No erosive change. Visualized left lung is clear. There is aortic atherosclerosis. IMPRESSION: Generalized osteoarthritic change. No acute fracture or dislocation. Superior migration of the humeral head is indicative chronic rotator cuff tear. There is aortic atherosclerosis. Aortic Atherosclerosis (ICD10-I70.0). Electronically Signed   By: Lowella Grip III M.D.   On: 07/17/2018 18:56    Procedures Procedures (including  critical care time)  Medications Ordered in ED Medications - No data to display   Initial Impression / Assessment and Plan / ED Course  I have reviewed the triage vital signs and the nursing notes.  Pertinent labs & imaging results that were available during my care of the patient were reviewed by me and considered in my medical decision making (see chart for details).     84 year old female presented today from [facility].  Sent with reports that she had pain in her left upper extremity and x-rays there showed a new radial head fracture.  Here she has fever on my initial evaluation.  She does have some pain tenderness around the left wrist.  There is no erythema or warmth.  X-rays of the left shoulder, left elbow, left wrist did not show any acute fracture here.  However she is noted to have a urinary tract infection.  Given her leukocytosis, advanced age, and fever, plan admission for IV antibiotics and hydration.  Discussed care with patient's, daughter, and son.  Concerned  that she could lose her bed at [facility].  I feel the patient likely could be discharged tomorrow morning, if she continues to do well.  She is noted on her chart that she has a penicillin allergy.  She states that this has been a rash has been a long time since she has had a penicillin.  She is being given Rocephin here in the department. I suspect that the left wrist pain is due to osteoarthritis and increased pain in conjunction with her febrile illness.  I do not see any signs or symptoms of joint I      nfection.  We will place wrist immobilizer for comfort  Discussed with Dr. Tamala Julian and he will see for admission Final Clinical Impressions(s) / ED Diagnoses   Final diagnoses:  Urinary tract infection without hematuria, site unspecified  Febrile illness  Wrist pain, left    ED Discharge Orders    None       Pattricia Boss, MD 07/17/18 [place]    Pattricia Boss, MD 08/11/18 1524

## 2018-07-17 NOTE — ED Notes (Signed)
Hospitalist at bedside 

## 2018-07-17 NOTE — H&P (Signed)
History and Physical    [name] RRN:[id number] DOB: [birth year] DOA: 07/17/2018  Referring MD/NP/PA: Pattricia Boss, MD PCP: Deland Pretty, MD  Patient coming from: [facility] via EMS  Chief Complaint: Left arm pain  I have personally briefly reviewed patient's old medical records in [place]   HPI: [name] is a 84 y.o. female with medical history significant of HTN, HLD, diastolic CHF last EF 33% with grade 1 DD, osteoporosis, osteoarthritis, and incarcerated hiatal hernia s/p laparoscopic repair by Dr. Lucia Gaskins on 7/9 with G-tube in place; who presents with complaints of left arm pain over the last 2 days.  X-rays at the facility reported oblique fracture of the radial head of indeterminate age.  History is obtained from patient who is a adequate historian.  Patient notes that her whole arm hurts from the shoulder to the elbow, and down into her wrist.  Pain is approximately 7/10 on the pain scale, and worsened by any movement of the arm.  Denies any recent trauma or injury to the arm that she is aware of.  At baseline patient has been wheelchair-bound, but has been participating in physical therapy where she has been trying to walk again.  Other associated symptoms include complaints of burning with urination, urinary frequency, mild abdominal pain near surgery site, and back pain.  She has been utilizing oxycodone with to help with pain symptoms.  Denies having any chest pain, nausea, vomiting, patient stresses that she needs to get back to [facility] tomorrow so that she does not lose her bed.  ED Course: Upon admission to the emergency department patient was found to be febrile up to 100.5 F with all other vital signs stable.  Labs revealed WBC of 13.9, hemoglobin 10.9, sodium 131, and lactic acid 1.72.  X-ray imaging showed old injuries, but no acute fracture or dislocation.  Urinalysis was positive for large leukocytes negative nitrites, few bacteria, and greater than  50 WBCs.  Patient was started on Rocephin and left wrist was splinted.  Review of Systems  Constitutional: Positive for fever and malaise/fatigue. Negative for chills.  HENT: Negative for nosebleeds and sore throat.   Eyes: Negative for double vision and photophobia.  Respiratory: Negative for cough and shortness of breath.   Cardiovascular: Negative for chest pain and leg swelling.  Gastrointestinal: Positive for abdominal pain. Negative for diarrhea, nausea and vomiting.  Genitourinary: Positive for dysuria and frequency.  Musculoskeletal: Positive for back pain and joint pain. Negative for falls.  Neurological: Negative for sensory change and headaches.  Psychiatric/Behavioral: Negative for memory loss and substance abuse.    Past Medical History:  Diagnosis Date  . Actinic keratosis   . Allergic rhinitis   . Back pain   . Chronic constipation   . Chronic diastolic CHF (congestive heart failure) ([place]) 06/03/2018  . GERD (gastroesophageal reflux disease)   . Hyperlipidemia   . Hypertension   . Hyponatremia   . Leg edema   . Macular degeneration   . Mitral regurgitation    mild by echo 2003  . Osteoarthritis   . Osteoarthrosis   . Osteoporosis   . Tenosynovitis    myxomatous    Past Surgical History:  Procedure Laterality Date  . CARPAL TUNNEL RELEASE     bilateral  . ESOPHAGOGASTRODUODENOSCOPY (EGD) WITH PROPOFOL N/A 06/05/2018   Procedure: ESOPHAGOGASTRODUODENOSCOPY (EGD) WITH PROPOFOL;  Surgeon: Otis Brace, MD;  Location: [facility];  Service: Gastroenterology;  Laterality: N/A;  . hernial repain    .  HIATAL HERNIA REPAIR N/A 06/07/2018   Procedure: LAPAROSCOPIC REPAIR OF HIATAL HERNIA;  Surgeon: Alphonsa Overall, MD;  Location: [place];  Service: General;  Laterality: N/A;  . INGUINAL HERNIA REPAIR     laparoscopic preperitoneal repair of bilateral inguinal hernias  . IR GJ TUBE CHANGE  06/20/2018  . LAPAROSCOPIC GASTROSTOMY N/A 06/07/2018   Procedure: LAPAROSCOPIC  GASTROSTOMY;  Surgeon: Alphonsa Overall, MD;  Location: [facility];  Service: General;  Laterality: N/A;  . REPLACEMENT TOTAL KNEE BILATERAL    . REVERSE SHOULDER ARTHROPLASTY Right 05/08/2014   Procedure: REVERSE SHOULDER ARTHROPLASTY RIGHT ;  Surgeon: Marin Shutter, MD;  Location: [facility];  Service: Orthopedics;  Laterality: Right;  . SYNOVECTOMY     of right long finger  . tonisellectomy    . tonisillectomy    . TONSILLECTOMY       reports that she quit smoking about 50 years ago. Her smoking use included cigarettes. She has a 2.00 pack-year smoking history. She has never used smokeless tobacco. She reports that she drinks about 2.0 standard drinks of alcohol per week. She reports that she does not use drugs.  Allergies  Allergen Reactions  . Aspirin Other (See Comments)    Unknown  . Boniva [Ibandronic Acid] Other (See Comments)    Unknown  . Morphine Other (See Comments)    Unknown  . Nitrofuran Derivatives Other (See Comments)    Unknown  . Penicillins Other (See Comments)    Received Ancef without problem  Has patient had a PCN reaction causing immediate rash, facial/tongue/throat swelling, SOB or lightheadedness with hypotension: Unknown Has patient had a PCN reaction causing severe rash involving mucus membranes or skin necrosis: Unknown Has patient had a PCN reaction that required hospitalization: Unknown Has patient had a PCN reaction occurring within the last 10 years: Unknown If all of the above answers are "NO", then may proceed with Cephalosporin use.   . Sulfonamide Derivatives Other (See Comments)    Unknown    Family History  Problem Relation Age of Onset  . Heart attack Father   . Heart attack Brother     Prior to Admission medications   Medication Sig Start Date End Date Taking? Authorizing Provider  acetaminophen (TYLENOL) 160 MG/5ML solution Take 31.3 mLs (1,000 mg total) by mouth every 8 (eight) hours as needed. Patient taking differently: Take 960 mg by mouth  every 8 (eight) hours as needed for moderate pain.  06/24/18  Yes Earnstine Regal, PA-C  geriatric multivitamins-minerals (ELDERTONIC/GEVRABON) LIQD Place 15 mLs into feeding tube daily. 06/24/18  Yes Earnstine Regal, PA-C  insulin aspart (NOVOLOG) 100 UNIT/ML injection Inject 0-9 Units into the skin as directed. Sliding Scale: 101-150=1 unit, 151-200=2 units, 201-250=3 units, 251-300=5 units, 301-350=7 units, >350=9 units, Call MD for BS>400 or <60   Yes [provider]  oxyCODONE (ROXICODONE) 5 MG/5ML solution Place 5 mg into feeding tube every 6 (six) hours as needed for moderate pain or severe pain.   Yes [provider]  polyethylene glycol (MIRALAX / GLYCOLAX) packet Take 17 g by mouth daily. 06/24/18  Yes Earnstine Regal, PA-C  estradiol (ESTRACE) 0.1 MG/GM vaginal cream Place 1 Applicatorful vaginally 2 (two) times a week. Mondays and Thursdays    [provider]  Nutritional Supplements (FEEDING SUPPLEMENT, OSMOLITE 1.2 CAL,) LIQD Continue tube feeding at 55 ml/hr with adjustments per your dietician at the SNF Patient not taking: Reported on 07/17/2018 06/24/18   Earnstine Regal, PA-C  pantoprazole sodium (PROTONIX) 40 mg/20  mL PACK Place 20 mLs (40 mg total) into feeding tube 2 (two) times daily. Patient not taking: Reported on 07/17/2018 06/24/18 07/24/18  Earnstine Regal, PA-C    Physical Exam:  Constitutional: Elderly female appears to be in no acute distress at this time Vitals:   07/17/18 1658 07/17/18 1732  BP: 134/67   Pulse: 89   Resp: 18   Temp: 99.7 F (37.6 C) (!) 100.5 F (38.1 C)  TempSrc: Oral Rectal  SpO2: 95%    Eyes: PERRL, lids and conjunctivae normal ENMT: Mucous membranes are dry. Posterior pharynx clear of any exudate or lesions.  Neck: normal, supple, no masses, no thyromegaly Respiratory: clear to auscultation bilaterally, no wheezing, no crackles. Normal respiratory effort. No accessory muscle use.  Cardiovascular: Regular  rate and rhythm, no murmurs / rubs / gallops. No extremity edema. 2+ pedal pulses. No carotid bruits.  Abdomen: Mild tenderness over G-tube, no masses palpated. No hepatosplenomegaly. Bowel sounds positive.  G-tube in place. Musculoskeletal: no clubbing / cyanosis.  Left arm tenderness to palpation specifically of the shoulder, elbow, and wrist.  Mild swelling noted at the elbow. Limited range of motion due to pain. Skin: no rashes, lesions, ulcers. No induration Neurologic: CN 2-12 grossly intact. Sensation intact, DTR normal. Strength 5/5 in all 4.  Psychiatric: Normal judgment and insight. Alert and oriented x 3. Normal mood.     Labs on Admission: I have personally reviewed following labs and imaging studies  CBC: Recent Labs  Lab 07/17/18 1800  WBC 13.9*  NEUTROABS 10.8*  HGB 10.9*  HCT 33.5*  MCV 84.6  PLT 196*   Basic Metabolic Panel: Recent Labs  Lab 07/17/18 1800  NA 131*  K 4.1  CL 91*  CO2 30  GLUCOSE 127*  BUN 16  CREATININE 0.53  CALCIUM 8.9   GFR: CrCl cannot be calculated (Unknown ideal weight.). Liver Function Tests: Recent Labs  Lab 07/17/18 1800  AST 22  ALT 23  ALKPHOS 111  BILITOT 0.4  PROT 7.5  ALBUMIN 3.2*   No results for input(s): LIPASE, AMYLASE in the last 168 hours. No results for input(s): AMMONIA in the last 168 hours. Coagulation Profile: No results for input(s): INR, PROTIME in the last 168 hours. Cardiac Enzymes: No results for input(s): CKTOTAL, CKMB, CKMBINDEX, TROPONINI in the last 168 hours. BNP (last 3 results) No results for input(s): PROBNP in the last 8760 hours. HbA1C: No results for input(s): HGBA1C in the last 72 hours. CBG: No results for input(s): GLUCAP in the last 168 hours. Lipid Profile: No results for input(s): CHOL, HDL, LDLCALC, TRIG, CHOLHDL, LDLDIRECT in the last 72 hours. Thyroid Function Tests: No results for input(s): TSH, T4TOTAL, FREET4, T3FREE, THYROIDAB in the last 72 hours. Anemia Panel: No  results for input(s): VITAMINB12, FOLATE, FERRITIN, TIBC, IRON, RETICCTPCT in the last 72 hours. Urine analysis:    Component Value Date/Time   COLORURINE YELLOW 07/17/2018 1654   APPEARANCEUR CLOUDY (A) 07/17/2018 1654   LABSPEC 1.015 07/17/2018 1654   PHURINE 7.0 07/17/2018 1654   GLUCOSEU NEGATIVE 07/17/2018 1654   HGBUR NEGATIVE 07/17/2018 1654   BILIRUBINUR NEGATIVE 07/17/2018 1654   KETONESUR NEGATIVE 07/17/2018 1654   PROTEINUR 30 (A) 07/17/2018 1654   UROBILINOGEN 0.2 05/05/2014 2014   NITRITE NEGATIVE 07/17/2018 1654   LEUKOCYTESUR LARGE (A) 07/17/2018 1654   Sepsis Labs: No results found for this or any previous visit (from the past 240 hour(s)).   Radiological Exams on Admission: Dg Chest 1 View  Result Date: 07/17/2018 CLINICAL DATA:  Pain EXAM: CHEST  1 VIEW COMPARISON:  June 02, 2018 FINDINGS: There is no edema or consolidation. Heart size and pulmonary vascularity are within normal limits. There is upper thoracic levoscoliosis with lower thoracic dextroscoliosis. There is a total shoulder replacement on the right. There is aortic atherosclerosis. IMPRESSION: No edema or consolidation. Stable cardiac silhouette. There is aortic atherosclerosis. Aortic Atherosclerosis (ICD10-I70.0). Electronically Signed   By: Lowella Grip III M.D.   On: 07/17/2018 18:51   Dg Elbow Complete Left  Result Date: 07/17/2018 CLINICAL DATA:  Pain EXAM: LEFT ELBOW - COMPLETE 3+ VIEW COMPARISON:  None. FINDINGS: Frontal, lateral, and bilateral oblique views were obtained. There is evidence of an apparent old fracture of the proximal radial metaphysis with remodeling. There is no acute fracture evident. No dislocation. No joint effusion. There is moderate osteoarthritic change. There is spurring along the coracoid process of the proximal ulna. No erosive change. IMPRESSION: Old healed fracture with remodeling involving the proximal radial metaphysis. Moderate osteoarthritic change. No acute  fracture or dislocation. No evident joint effusion. Electronically Signed   By: Lowella Grip III M.D.   On: 07/17/2018 18:54   Dg Wrist Complete Left  Result Date: 07/17/2018 CLINICAL DATA:  Pain EXAM: LEFT WRIST - COMPLETE 3+ VIEW COMPARISON:  Left hand August 31, 2004 FINDINGS: Frontal, oblique, lateral, and ulnar deviation scaphoid images were obtained. There is no acute fracture or dislocation. There is evidence of old trauma involving the distal scaphoid with remodeling. There is advanced arthropathy in the scaphotrapezial and first carpal-metacarpal joints with remodeling in these areas. There is also osteoarthritic change in the radiocarpal joint. No frank erosion. There is calcification in the triangular fibrocartilage region. IMPRESSION: No acute fracture or dislocation. Old trauma involving the distal scaphoid. Advanced arthropathy in the lateral wrist joint region. There is also osteoarthritic change in the radiocarpal joint. There is calcification in the triangular fibrocartilage region. Question chondrocalcinosis versus calcification due to chronic triangular fibrocartilage tear. Electronically Signed   By: Lowella Grip III M.D.   On: 07/17/2018 18:53   Dg Shoulder Left  Result Date: 07/17/2018 CLINICAL DATA:  Pain EXAM: LEFT SHOULDER - 2+ VIEW COMPARISON:  None. FINDINGS: Internal rotation, external rotation, and Y scapular images were obtained. There is no appreciable acute fracture or dislocation. There is diffuse joint space narrowing with superior migration of the humeral head. No erosive change. Visualized left lung is clear. There is aortic atherosclerosis. IMPRESSION: Generalized osteoarthritic change. No acute fracture or dislocation. Superior migration of the humeral head is indicative chronic rotator cuff tear. There is aortic atherosclerosis. Aortic Atherosclerosis (ICD10-I70.0). Electronically Signed   By: Lowella Grip III M.D.   On: 07/17/2018 18:56    EKG:  Independently reviewed.  Sinus rhythm at 91 bpm  Assessment/Plan Sepsis 2/2 Urinary tract infection: Patient was found to be febrile up to 100.5 F with WBC elevated 13.9.  Urinalysis was positive for signs of infection.  Review of records from previous hospitalization showed urine culture positive for 30,000 colonies/ml of E. coli with resistance to Zosyn, Bactrim, cefazolin, and ampicillin/sulbactam.  Sensitive to ciprofloxacin, ceftriaxone, and imipenem as seen on susceptibilities.  Blood/urine cultures were obtained, and patient has been started on antibiotics of ceftriaxone. - Admit to a telemetry bed - Sepsis protocol initiated without fluid bolus - Follow-up blood and urine culture - Continue empiric antibiotics of ceftriaxone  - Tylenol as needed for fever  Left arm pain, history of osteoarthritis and osteoporosis:  Patient presented with complaints of left wrist pain.  X-rays at outside facility reportedly concern for radial head fracture.  However, repeat x-rays show degenerative changes, healed old fractures, chronic appearing rotator cuff tear, but no acute fractures/dislocation.  On physical exam some swelling noted.  Patient's wrist was splinted.  Question of symptoms related to osteoarthritis vs. unseen fracture vs. Possible clot. - Check ESR/CRP - Continue oxycodone for pain control - Physical therapy consult in a.m. - Consider need of evaluation by orthopedics in a.m.  Recent incarcerated hiatal hernia, GERD: Patient status post laparoscopic repair with placement of G-tube by Dr. Lucia Gaskins from 05/2018.. - Continue current regimen of Osmolite 1.2 at 55 mL/h with free water flushes  - Continue Protonix  Anemia of chronic disease: Initial hemoglobin noted to be 10.9.  At baseline hemoglobin appears to range from 9-10. - Continue to monitor  Hyponatremia: Chronic.  Initial sodium 131 on admission. - Continue to monitor  Hyperglycemia: Blood glucose on admission was mildly elevated  127.  Last hemoglobin A1c noted to be 5.5 on 06/10/2018.  Patient denies any history of diabetes, but since being on tube feeds notes that she has been getting insulin. - CBGs q. a.m. Daily - Held sliding scale insulin   Chronic diastolic CHF:  Last echo showed EF of 55% with grade 1 diastolic dysfunction in 0/9311.  Patient does not have leg edema.  No shortness of breath.  CHF seems to be compensated. - Monitor intake and output - Daily weights  DVT prophylaxis: Lovenox Code Status: DNR  Family Communication: No family present at bedside Disposition Plan: Likely discharge back to skilled nursing facility once medically stable Consults called: none  Admission status: Inpatient  Norval Morton MD [facility] Pager [phone]   If 7PM-7AM, please contact night-coverage [email or web address] Password [facility]  07/17/2018, 7:36 PM

## 2018-07-17 NOTE — ED Notes (Signed)
ED TO INPATIENT HANDOFF REPORT  Name/Age/Gender [name] 84 y.o. female  Code Status    Code Status Orders  (From admission, onward)         Start     Ordered   07/17/18 1958  Do not attempt resuscitation (DNR)  Continuous    Question Answer Comment  In the event of cardiac or respiratory ARREST Do not call a "code blue"   In the event of cardiac or respiratory ARREST Do not perform Intubation, CPR, defibrillation or ACLS   In the event of cardiac or respiratory ARREST Use medication by any route, position, wound care, and other measures to relive pain and suffering. May use oxygen, suction and manual treatment of airway obstruction as needed for comfort.      07/17/18 2000        Code Status History    Date Active Date Inactive Code Status Order ID Comments User Context   06/03/2018 0031 06/24/2018 2120 DNR [id number]  Ivor Costa, MD ED   03/19/2018 1149 03/22/2018 1803 DNR [id number]  Vashti Hey, MD ED   08/05/2015 0600 08/06/2015 1736 DNR [id number]  Rise Patience, MD Inpatient   08/05/2015 0459 08/05/2015 0600 DNR [id number]  Rise Patience, MD ED   05/08/2014 1606 05/10/2014 1812 Full Code [id number]  Marcellus Scott Inpatient   05/05/2014 2151 05/08/2014 1606 Full Code [id number]  Hosie Poisson, MD Inpatient   06/28/2012 1722 07/01/2012 1903 Full Code [id number]  Rondel Baton, RN Inpatient   06/09/2012 2158 06/13/2012 1725 Full Code [id number]  Ron Parker, RN Inpatient      Home/SNF/Other Skilled nursing facility  Chief Complaint fractured elbow and wrist  Level of Care/Admitting Diagnosis ED Disposition    ED Disposition Condition Comment   [place] [facility]: [facility] [[id number]]  Level of Care: Telemetry [5]  Admit to tele based on following criteria: Complex arrhythmia (Bradycardia/Tachycardia)  Diagnosis: UTI (urinary tract infection) [921194]  Admitting Physician: Norval Morton [[id number]]  Attending  Physician: Norval Morton [[id number]]  Estimated length of stay: past midnight tomorrow  Certification:: I certify this patient will need inpatient services for at least 2 midnights  PT Class (Do Not Modify): Inpatient [101]  PT Acc Code (Do Not Modify): Private [1]       Medical History Past Medical History:  Diagnosis Date  . Actinic keratosis   . Allergic rhinitis   . Back pain   . Chronic constipation   . Chronic diastolic CHF (congestive heart failure) ([place]) 06/03/2018  . GERD (gastroesophageal reflux disease)   . Hyperlipidemia   . Hypertension   . Hyponatremia   . Leg edema   . Macular degeneration   . Mitral regurgitation    mild by echo 2003  . Osteoarthritis   . Osteoarthrosis   . Osteoporosis   . Tenosynovitis    myxomatous    Allergies Allergies  Allergen Reactions  . Aspirin Other (See Comments)    Unknown  . Boniva [Ibandronic Acid] Other (See Comments)    Unknown  . Morphine Other (See Comments)    Unknown  . Nitrofuran Derivatives Other (See Comments)    Unknown  . Penicillins Other (See Comments)    Received Ancef without problem  Has patient had a PCN reaction causing immediate rash, facial/tongue/throat swelling, SOB or lightheadedness with hypotension: Unknown Has patient had a PCN reaction causing severe rash involving mucus membranes or skin necrosis: Unknown Has patient had a  PCN reaction that required hospitalization: Unknown Has patient had a PCN reaction occurring within the last 10 years: Unknown If all of the above answers are "NO", then may proceed with Cephalosporin use.   . Sulfonamide Derivatives Other (See Comments)    Unknown    IV Location/Drains/Wounds Patient Lines/Drains/Airways Status   Active Line/Drains/Airways    Name:   Placement date:   Placement time:   Site:   Days:   Peripheral IV 07/17/18 Right Forearm   07/17/18    1758    Forearm   less than 1   Gastrostomy/Enterostomy Gastrostomy;Jejunostomy 20 Fr. LUQ    06/20/18    1003    LUQ   27   External Urinary Catheter   06/23/18    0912    -   24   Incision (Closed) 06/07/18 Abdomen Other (Comment)   06/07/18    1735     40   Incision - 6 Ports Abdomen 1: Right;Lateral;Lower 2: Right;Lateral;Upper 3: Mid;Upper 4: Mid;Left 5: Left;Lower 6: Left;Lower;Lateral   06/07/18    1602     40          Labs/Imaging Results for orders placed or performed during the hospital encounter of 07/17/18 (from the past 48 hour(s))  Urinalysis, Routine w reflex microscopic     Status: Abnormal   Collection Time: 07/17/18  4:54 PM  Result Value Ref Range   Color, Urine YELLOW YELLOW   APPearance CLOUDY (A) CLEAR   Specific Gravity, Urine 1.015 1.005 - 1.030   pH 7.0 5.0 - 8.0   Glucose, UA NEGATIVE NEGATIVE mg/dL   Hgb urine dipstick NEGATIVE NEGATIVE   Bilirubin Urine NEGATIVE NEGATIVE   Ketones, ur NEGATIVE NEGATIVE mg/dL   Protein, ur 30 (A) NEGATIVE mg/dL   Nitrite NEGATIVE NEGATIVE   Leukocytes, UA LARGE (A) NEGATIVE   RBC / HPF 6-10 0 - 5 RBC/hpf   WBC, UA >50 (H) 0 - 5 WBC/hpf   Bacteria, UA FEW (A) NONE SEEN   Mucus PRESENT    Amorphous Crystal PRESENT     Comment: Performed at [facility], [place] [address], [place], [place] [address]  I-Stat CG4 Lactic Acid, ED     Status: None   Collection Time: 07/17/18  5:53 PM  Result Value Ref Range   Lactic Acid, Venous 1.24 0.5 - 1.9 mmol/L  CBC with Differential/Platelet     Status: Abnormal   Collection Time: 07/17/18  6:00 PM  Result Value Ref Range   WBC 13.9 (H) 4.0 - 10.5 K/uL   RBC 3.96 3.87 - 5.11 MIL/uL   Hemoglobin 10.9 (L) 12.0 - 15.0 g/dL   HCT 33.5 (L) 36.0 - 46.0 %   MCV 84.6 78.0 - 100.0 fL   MCH 27.5 26.0 - 34.0 pg   MCHC 32.5 30.0 - 36.0 g/dL   RDW 13.4 11.5 - 15.5 %   Platelets 570 (H) 150 - 400 K/uL   Neutrophils Relative % 77 %   Neutro Abs 10.8 (H) 1.7 - 7.7 K/uL   Lymphocytes Relative 7 %   Lymphs Abs 1.0 0.7 - 4.0 K/uL   Monocytes Relative 15 %   Monocytes  Absolute 2.0 (H) 0.1 - 1.0 K/uL   Eosinophils Relative 1 %   Eosinophils Absolute 0.1 0.0 - 0.7 K/uL   Basophils Relative 0 %   Basophils Absolute 0.0 0.0 - 0.1 K/uL    Comment: Performed at [facility], [address]  [place], [place] [address]  Comprehensive metabolic panel     Status: Abnormal   Collection Time: 07/17/18  6:00 PM  Result Value Ref Range   Sodium 131 (L) 135 - 145 mmol/L   Potassium 4.1 3.5 - 5.1 mmol/L   Chloride 91 (L) 98 - 111 mmol/L   CO2 30 22 - 32 mmol/L   Glucose, Bld 127 (H) 70 - 99 mg/dL   BUN 16 8 - 23 mg/dL   Creatinine, Ser 0.53 0.44 - 1.00 mg/dL   Calcium 8.9 8.9 - 10.3 mg/dL   Total Protein 7.5 6.5 - 8.1 g/dL   Albumin 3.2 (L) 3.5 - 5.0 g/dL   AST 22 15 - 41 U/L   ALT 23 0 - 44 U/L   Alkaline Phosphatase 111 38 - 126 U/L   Total Bilirubin 0.4 0.3 - 1.2 mg/dL   GFR calc non Af Amer >60 >60 mL/min   GFR calc Af Amer >60 >60 mL/min    Comment: (NOTE) The eGFR has been calculated using the CKD EPI equation. This calculation has not been validated in all clinical situations. eGFR's persistently <60 mL/min signify possible Chronic Kidney Disease.    Anion gap 10 5 - 15    Comment: Performed at [facility], [place] [address], [place], [place] [address]   Dg Chest 1 View  Result Date: 07/17/2018 CLINICAL DATA:  Pain EXAM: CHEST  1 VIEW COMPARISON:  June 02, 2018 FINDINGS: There is no edema or consolidation. Heart size and pulmonary vascularity are within normal limits. There is upper thoracic levoscoliosis with lower thoracic dextroscoliosis. There is a total shoulder replacement on the right. There is aortic atherosclerosis. IMPRESSION: No edema or consolidation. Stable cardiac silhouette. There is aortic atherosclerosis. Aortic Atherosclerosis (ICD10-I70.0). Electronically Signed   By: Lowella Grip III M.D.   On: 07/17/2018 18:51   Dg Elbow Complete Left  Result Date: 07/17/2018 CLINICAL DATA:  Pain EXAM:  LEFT ELBOW - COMPLETE 3+ VIEW COMPARISON:  None. FINDINGS: Frontal, lateral, and bilateral oblique views were obtained. There is evidence of an apparent old fracture of the proximal radial metaphysis with remodeling. There is no acute fracture evident. No dislocation. No joint effusion. There is moderate osteoarthritic change. There is spurring along the coracoid process of the proximal ulna. No erosive change. IMPRESSION: Old healed fracture with remodeling involving the proximal radial metaphysis. Moderate osteoarthritic change. No acute fracture or dislocation. No evident joint effusion. Electronically Signed   By: Lowella Grip III M.D.   On: 07/17/2018 18:54   Dg Wrist Complete Left  Result Date: 07/17/2018 CLINICAL DATA:  Pain EXAM: LEFT WRIST - COMPLETE 3+ VIEW COMPARISON:  Left hand August 31, 2004 FINDINGS: Frontal, oblique, lateral, and ulnar deviation scaphoid images were obtained. There is no acute fracture or dislocation. There is evidence of old trauma involving the distal scaphoid with remodeling. There is advanced arthropathy in the scaphotrapezial and first carpal-metacarpal joints with remodeling in these areas. There is also osteoarthritic change in the radiocarpal joint. No frank erosion. There is calcification in the triangular fibrocartilage region. IMPRESSION: No acute fracture or dislocation. Old trauma involving the distal scaphoid. Advanced arthropathy in the lateral wrist joint region. There is also osteoarthritic change in the radiocarpal joint. There is calcification in the triangular fibrocartilage region. Question chondrocalcinosis versus calcification due to chronic triangular fibrocartilage tear. Electronically Signed   By: Lowella Grip III M.D.   On: 07/17/2018 18:53   Dg Shoulder Left  Result Date: 07/17/2018  CLINICAL DATA:  Pain EXAM: LEFT SHOULDER - 2+ VIEW COMPARISON:  None. FINDINGS: Internal rotation, external rotation, and Y scapular images were obtained.  There is no appreciable acute fracture or dislocation. There is diffuse joint space narrowing with superior migration of the humeral head. No erosive change. Visualized left lung is clear. There is aortic atherosclerosis. IMPRESSION: Generalized osteoarthritic change. No acute fracture or dislocation. Superior migration of the humeral head is indicative chronic rotator cuff tear. There is aortic atherosclerosis. Aortic Atherosclerosis (ICD10-I70.0). Electronically Signed   By: Lowella Grip III M.D.   On: 07/17/2018 18:56    Pending Labs Unresulted Labs (From admission, onward)    Start     Ordered   07/18/18 0500  CBC  Tomorrow morning,   R     07/17/18 2000   07/18/18 4818  Basic metabolic panel  Tomorrow morning,   R     07/17/18 2000   07/17/18 2000  Procalcitonin  Add-on,   R     07/17/18 2000   07/17/18 2000  Lactic acid, plasma  STAT Now then every 3 hours,   STAT     07/17/18 2000   07/17/18 1923  Urine Culture  Once,   STAT     07/17/18 1922   07/17/18 1654  Blood culture (routine x 2)  BLOOD CULTURE X 2,   STAT     07/17/18 1655          Vitals/Pain Today's Vitals   07/17/18 1915 07/17/18 1930 07/17/18 1945 07/17/18 2000  BP:  140/84  130/68  Pulse: 92 94 94 94  Resp: 19 20 20 19   Temp:      TempSrc:      SpO2: 96% 97% 96% 96%  PainSc:        Isolation Precautions No active isolations  Medications Medications  acetaminophen (TYLENOL) solution 960 mg (has no administration in time range)  oxyCODONE (ROXICODONE) 5 MG/5ML solution 5 mg (has no administration in time range)  polyethylene glycol (MIRALAX / GLYCOLAX) packet 17 g (has no administration in time range)  geriatric multivitamins-minerals (ELDERTONIC/GEVRABON) liquid 15 mL (has no administration in time range)  enoxaparin (LOVENOX) injection 40 mg (has no administration in time range)  sodium chloride flush (NS) 0.9 % injection 3 mL (has no administration in time range)  ondansetron (ZOFRAN) tablet 4  mg (has no administration in time range)    Or  ondansetron (ZOFRAN) injection 4 mg (has no administration in time range)  albuterol (PROVENTIL) (2.5 MG/3ML) 0.083% nebulizer solution 2.5 mg (has no administration in time range)  cefTRIAXone (ROCEPHIN) 1 g in sodium chloride 0.9 % 100 mL IVPB (has no administration in time range)  sodium chloride 0.9 % bolus 500 mL (0 mLs Intravenous Stopped 07/17/18 1941)  cefTRIAXone (ROCEPHIN) 1 g in sodium chloride 0.9 % 100 mL IVPB (0 g Intravenous Stopped 07/17/18 2011)    Mobility non-ambulatory

## 2018-07-17 NOTE — ED Triage Notes (Signed)
Per EMS, pt from [facility] sent for fractures to elbow and wrist. Pt warm to touch for EMS. Pt complains of 7/10 pain in wrist and elbow. Pt does not recall injury.

## 2018-07-17 NOTE — ED Notes (Signed)
Bed: WA03 Expected date:  Expected time:  Means of arrival:  Comments: 84 yo dx elbow and arm fx

## 2018-07-18 ENCOUNTER — Ambulatory Visit: Payer: 59 | Admitting: Podiatry

## 2018-07-18 ENCOUNTER — Encounter (HOSPITAL_COMMUNITY): Payer: Self-pay

## 2018-07-18 DIAGNOSIS — N390 Urinary tract infection, site not specified: Secondary | ICD-10-CM

## 2018-07-18 DIAGNOSIS — A419 Sepsis, unspecified organism: Secondary | ICD-10-CM | POA: Diagnosis present

## 2018-07-18 DIAGNOSIS — M79602 Pain in left arm: Secondary | ICD-10-CM | POA: Diagnosis present

## 2018-07-18 LAB — GLUCOSE, CAPILLARY: Glucose-Capillary: 159 mg/dL — ABNORMAL HIGH (ref 70–99)

## 2018-07-18 LAB — BASIC METABOLIC PANEL
Anion gap: 9 (ref 5–15)
BUN: 17 mg/dL (ref 8–23)
CALCIUM: 8.6 mg/dL — AB (ref 8.9–10.3)
CHLORIDE: 95 mmol/L — AB (ref 98–111)
CO2: 27 mmol/L (ref 22–32)
CREATININE: 0.47 mg/dL (ref 0.44–1.00)
GFR calc non Af Amer: >60 mL/min (ref >60)
Glucose, Bld: 160 mg/dL — ABNORMAL HIGH (ref 70–99)
Potassium: 4.2 mmol/L (ref 3.5–5.1)
SODIUM: 131 mmol/L — AB (ref 135–145)

## 2018-07-18 LAB — CBC
HCT: 30.1 % — ABNORMAL LOW (ref 36.0–46.0)
Hemoglobin: 9.9 g/dL — ABNORMAL LOW (ref 12.0–15.0)
MCH: 27.7 pg (ref 26.0–34.0)
MCHC: 32.9 g/dL (ref 30.0–36.0)
MCV: 84.3 fL (ref 78.0–100.0)
PLATELETS: 487 10*3/uL — AB (ref 150–400)
RBC: 3.57 MIL/uL — ABNORMAL LOW (ref 3.87–5.11)
RDW: 13.4 % (ref 11.5–15.5)
WBC: 11.7 10*3/uL — ABNORMAL HIGH (ref 4.0–10.5)

## 2018-07-18 LAB — PROCALCITONIN

## 2018-07-18 LAB — C-REACTIVE PROTEIN: CRP: 13.6 mg/dL — AB (ref <1.0)

## 2018-07-18 NOTE — Progress Notes (Signed)
PROGRESS NOTE    [name]  QQP:[id number] DOB: Oct 21, 1926 DOA: 07/17/2018 PCP: Deland Pretty, MD     Brief Narrative:  [name] is a 84 y.o. female with medical history significant of HTN, HLD, diastolic CHF last EF 71% with grade 1 DD, osteoporosis, osteoarthritis, and incarcerated hiatal hernia s/p laparoscopic repair by Dr. Lucia Gaskins on 7/9 with G-tube in place; who presents with complaints of left arm pain over the last 2 days.  X-rays at the facility reported oblique fracture of the radial head of indeterminate age.  Patient notes that her whole arm hurts from the shoulder to the elbow, and down into her wrist.  Pain is approximately 7/10 on the pain scale, and worsened by any movement of the arm.  Denies any recent trauma or injury to the arm that she is aware of.  At baseline patient has been wheelchair-bound, but has been participating in physical therapy where she has been trying to walk again.  Other associated symptoms include complaints of burning with urination, urinary frequency, mild abdominal pain near surgery site, and back pain.  She has been utilizing oxycodone with to help with pain symptoms. Upon admission to the emergency department patient was found to be febrile up to 100.5 F with all other vital signs stable.  Labs revealed WBC of 13.9, hemoglobin 10.9, sodium 131, and lactic acid 1.96.  X-ray imaging showed old injuries, but no acute fracture or dislocation.  Urinalysis was positive for large leukocytes negative nitrites, few bacteria, and greater than 50 WBCs.  Patient was started on Rocephin and left wrist was splinted.   New events last 24 hours / Subjective: She continues to complain of left wrist and left elbow pain.  Per daughter over the phone, patient has been overusing her left arm to get herself straightened up in her wheelchair due to her scoliosis.  Her daughter believes that this may have been reason for patient's acute left arm pain.  Patient has no  other acute complaints today.  Worried about losing her bed spot at [facility].  Assessment & Plan:   Principal Problem:   Sepsis secondary to UTI [facility]) Active Problems:   Osteoarthritis   Osteoporosis   Hyponatremia   Incarcerated hiatal hernia s/p repair/Gtube 06/07/2018   GERD (gastroesophageal reflux disease)   Left arm pain   Secondary to urinary tract infection, present on admission -Continue empiric Rocephin -Await culture results  Acute left wrist pain -X-rays with chronic appearing injuries, no acute fractures -Possible sprain of left wrist due to overuse  Recent incarcerated hiatal hernia, GERD -Status post laparoscopic repair with placement of G-tube July 2019 -Continue tube feeds, Protonix  Anemia of chronic disease -Baseline hemoglobin 9-10 -Remains stable  Chronic diastolic heart failure -Echocardiogram showed EF 24%, grade 1 diastolic dysfunction in July 2019 -Compensated without exacerbation    DVT prophylaxis: Lovenox Code Status: DNR Family Communication: Daughter over the phone Disposition Plan: Pending improvement   Consultants:   None  Procedures:   None   Antimicrobials:  Anti-infectives (From admission, onward)   Start     Dose/Rate Route Frequency Ordered Stop   07/18/18 2000  cefTRIAXone (ROCEPHIN) 1 g in sodium chloride 0.9 % 100 mL IVPB     1 g 200 mL/hr over 30 Minutes Intravenous Every 24 hours 07/17/18 2000     07/17/18 1930  cefTRIAXone (ROCEPHIN) 1 g in sodium chloride 0.9 % 100 mL IVPB     1 g 200 mL/hr over 30 Minutes  Intravenous  Once 07/17/18 1921 07/17/18 2011        Objective: Vitals:   07/17/18 1930 07/17/18 1945 07/17/18 2000 07/18/18 0458  BP: 140/84  130/68 130/67  Pulse: 94 94 94 96  Resp: 20 20 19 16   Temp:    98.7 F (37.1 C)  TempSrc:    Oral  SpO2: 97% 96% 96% 94%  Weight:    69.2 kg    Intake/Output Summary (Last 24 hours) at 07/18/2018 1215 Last data filed at 07/17/2018 2011 Gross per 24  hour  Intake 550 ml  Output -  Net 550 ml   Filed Weights   07/18/18 0458  Weight: 69.2 kg    Examination:  General exam: Appears calm and comfortable  Respiratory system: Clear to auscultation. Respiratory effort normal. Cardiovascular system: S1 & S2 heard, RRR. No JVD, murmurs, rubs, gallops or clicks. No pedal edema. Gastrointestinal system: Abdomen is nondistended, soft and nontender. No organomegaly or masses felt. Normal bowel sounds heard. Central nervous system: Alert and oriented. No focal neurological deficits. Extremities: +Left wrist in splint, no acute edema  Skin: No rashes, lesions or ulcers Psychiatry: Judgement and insight appear normal. Mood & affect appropriate.   Data Reviewed: I have personally reviewed following labs and imaging studies  CBC: Recent Labs  Lab 07/17/18 1800 07/18/18 0432  WBC 13.9* 11.7*  NEUTROABS 10.8*  --   HGB 10.9* 9.9*  HCT 33.5* 30.1*  MCV 84.6 84.3  PLT 570* 272*   Basic Metabolic Panel: Recent Labs  Lab 07/17/18 1800 07/18/18 0432  NA 131* 131*  K 4.1 4.2  CL 91* 95*  CO2 30 27  GLUCOSE 127* 160*  BUN 16 17  CREATININE 0.53 0.47  CALCIUM 8.9 8.6*   GFR: Estimated Creatinine Clearance: 41.6 mL/min (by C-G formula based on SCr of 0.47 mg/dL). Liver Function Tests: Recent Labs  Lab 07/17/18 1800  AST 22  ALT 23  ALKPHOS 111  BILITOT 0.4  PROT 7.5  ALBUMIN 3.2*   No results for input(s): LIPASE, AMYLASE in the last 168 hours. No results for input(s): AMMONIA in the last 168 hours. Coagulation Profile: No results for input(s): INR, PROTIME in the last 168 hours. Cardiac Enzymes: No results for input(s): CKTOTAL, CKMB, CKMBINDEX, TROPONINI in the last 168 hours. BNP (last 3 results) No results for input(s): PROBNP in the last 8760 hours. HbA1C: No results for input(s): HGBA1C in the last 72 hours. CBG: Recent Labs  Lab 07/18/18 0511  GLUCAP 159*   Lipid Profile: No results for input(s): CHOL, HDL,  LDLCALC, TRIG, CHOLHDL, LDLDIRECT in the last 72 hours. Thyroid Function Tests: No results for input(s): TSH, T4TOTAL, FREET4, T3FREE, THYROIDAB in the last 72 hours. Anemia Panel: No results for input(s): VITAMINB12, FOLATE, FERRITIN, TIBC, IRON, RETICCTPCT in the last 72 hours. Sepsis Labs: Recent Labs  Lab 07/17/18 1753 07/17/18 2121  PROCALCITON  --  <0.10  LATICACIDVEN 1.24 0.9    No results found for this or any previous visit (from the past 240 hour(s)).     Radiology Studies: Dg Chest 1 View  Result Date: 07/17/2018 CLINICAL DATA:  Pain EXAM: CHEST  1 VIEW COMPARISON:  June 02, 2018 FINDINGS: There is no edema or consolidation. Heart size and pulmonary vascularity are within normal limits. There is upper thoracic levoscoliosis with lower thoracic dextroscoliosis. There is a total shoulder replacement on the right. There is aortic atherosclerosis. IMPRESSION: No edema or consolidation. Stable cardiac silhouette. There is aortic atherosclerosis. Aortic  Atherosclerosis (ICD10-I70.0). Electronically Signed   By: Lowella Grip III M.D.   On: 07/17/2018 18:51   Dg Elbow Complete Left  Result Date: 07/17/2018 CLINICAL DATA:  Pain EXAM: LEFT ELBOW - COMPLETE 3+ VIEW COMPARISON:  None. FINDINGS: Frontal, lateral, and bilateral oblique views were obtained. There is evidence of an apparent old fracture of the proximal radial metaphysis with remodeling. There is no acute fracture evident. No dislocation. No joint effusion. There is moderate osteoarthritic change. There is spurring along the coracoid process of the proximal ulna. No erosive change. IMPRESSION: Old healed fracture with remodeling involving the proximal radial metaphysis. Moderate osteoarthritic change. No acute fracture or dislocation. No evident joint effusion. Electronically Signed   By: Lowella Grip III M.D.   On: 07/17/2018 18:54   Dg Wrist Complete Left  Result Date: 07/17/2018 CLINICAL DATA:  Pain EXAM: LEFT  WRIST - COMPLETE 3+ VIEW COMPARISON:  Left hand August 31, 2004 FINDINGS: Frontal, oblique, lateral, and ulnar deviation scaphoid images were obtained. There is no acute fracture or dislocation. There is evidence of old trauma involving the distal scaphoid with remodeling. There is advanced arthropathy in the scaphotrapezial and first carpal-metacarpal joints with remodeling in these areas. There is also osteoarthritic change in the radiocarpal joint. No frank erosion. There is calcification in the triangular fibrocartilage region. IMPRESSION: No acute fracture or dislocation. Old trauma involving the distal scaphoid. Advanced arthropathy in the lateral wrist joint region. There is also osteoarthritic change in the radiocarpal joint. There is calcification in the triangular fibrocartilage region. Question chondrocalcinosis versus calcification due to chronic triangular fibrocartilage tear. Electronically Signed   By: Lowella Grip III M.D.   On: 07/17/2018 18:53   Dg Shoulder Left  Result Date: 07/17/2018 CLINICAL DATA:  Pain EXAM: LEFT SHOULDER - 2+ VIEW COMPARISON:  None. FINDINGS: Internal rotation, external rotation, and Y scapular images were obtained. There is no appreciable acute fracture or dislocation. There is diffuse joint space narrowing with superior migration of the humeral head. No erosive change. Visualized left lung is clear. There is aortic atherosclerosis. IMPRESSION: Generalized osteoarthritic change. No acute fracture or dislocation. Superior migration of the humeral head is indicative chronic rotator cuff tear. There is aortic atherosclerosis. Aortic Atherosclerosis (ICD10-I70.0). Electronically Signed   By: Lowella Grip III M.D.   On: 07/17/2018 18:56      Scheduled Meds: . enoxaparin (LOVENOX) injection  40 mg Subcutaneous Q24H  . free water  150 mL Per Tube Q6H  . geriatric multivitamins-minerals  15 mL Per Tube Daily  . pantoprazole sodium  40 mg Per Tube Daily  .  sodium chloride flush  3 mL Intravenous Q12H   Continuous Infusions: . cefTRIAXone (ROCEPHIN)  IV    . feeding supplement (OSMOLITE 1.2 CAL) 1,000 mL (07/17/18 2229)     LOS: 1 day    Time spent: 40  minutes   Dessa Phi, DO [facility] [email or web address] Password [facility] 07/18/2018, 12:15 PM

## 2018-07-18 NOTE — Progress Notes (Signed)
CSW consulted to assist as pt is admitted from facility- [facility]. Met with pt at bedside- reports she has been long term care resident there for several months. After recent hospitalization at [facility] (see complete assessment below completed during that encounter), she returned and has been participating in physical therapy there. Reports plan to return at [place]. Spoke with facility as well who is reaching out to family to complete bed hold for pt. Will follow and assist with transition back to SNF at [place].  [name], MSW, LCSW Clinical Social Work 07/18/2018 [id number]    Clinical Social Work Assessment  Patient Details  Name: DEMAYA Cox MRN: [id number] Date of Birth: Feb 22, 1926  Date of referral:  06/08/18               Reason for consult:  Discharge Planning                           Permission sought to share information with:  Family Supports Permission granted to share information::  Yes, Verbal Permission Granted             Name::     [name]             Agency::  [facility]              Relationship::  daughter             Contact Information:  [phone]  Housing/Transportation Living arrangements for the past 2 months:  [place] of Information:  Patient Patient Interpreter Needed:  None Criminal Activity/Legal Involvement Pertinent to Current Situation/Hospitalization:  No - Comment as needed Significant Relationships:  Adult Children Lives with:  Facility Resident Do you feel safe going back to the place where you live?  Yes Need for family participation in patient care:  Yes (Comment)  Care giving concerns:  No family at bedside. Patient stated she is from [place] and has been at the facility for months. Patient stated she has support from family in the area. Patient stated she feels safe to return back to rehab.  Social Worker assessment / plan:  Patient stated she is agreeable to return back  to facility. CSW reached out to facility and spoke with admission coordinator Janie. Janie stated facility is ableto take patient back once medically stable  Employment status:  Retired Forensic scientist:  Medicare PT Recommendations:  [place] / Referral to community resources:  [place]  Patient/Family's Response to care:  [place] appreciate the care she has received during her stay   Patient/Family's Understanding of and Emotional Response to Diagnosis, Current Treatment, and Prognosis:  Patient agreeable with discharge plan  Emotional Assessment Appearance:  Appears stated age Attitude/Demeanor/Rapport:  Other, Engaged Affect (typically observed):  Accepting Orientation:  Oriented to Self, Oriented to Place, Oriented to  Time, Oriented to Situation Alcohol / Substance use:  Not Applicable Psych involvement (Current and /or in the community):  No (Comment)  Discharge Needs  Concerns to be addressed:  Care Coordination Readmission within the last 30 days:  No Current discharge risk:  Dependent with Mobility Barriers to Discharge:  Continued Medical Work up   [employer], LCSW 06/08/2018, 3:11 PM

## 2018-07-18 NOTE — Evaluation (Signed)
Physical Therapy Evaluation Patient Details Name: [name] MRN: [id number] DOB: 06-02-1926 Today's Date: 07/18/2018   History of Present Illness  [name] is a 84 y.o. female with medical history significant of HTN, HLD, diastolic CHF last EF 09% with grade 1 DD, osteoporosis, osteoarthritis, and incarcerated hiatal hernia s/p laparoscopic repair by Dr. Lucia Gaskins on 7/9 with G-tube in place; who presents with complaints of left arm pain over the last 2 days.    Clinical Impression  Patient presents with decreased independence with mobility due to pain, limited activity tolerance, decreased balance, and decreased strength.  She will benefit from skilled PT in the acute setting to allow return to SNF level rehab at [facility].    Follow Up Recommendations SNF;Supervision/Assistance - 24 hour    Equipment Recommendations  None recommended by PT    Recommendations for Other Services       Precautions / Restrictions Precautions Precautions: Fall Precaution Comments: G-tube      Mobility  Bed Mobility Overal bed mobility: Needs Assistance Bed Mobility: Supine to Sit;Sit to Supine     Supine to sit: Max assist;HOB elevated Sit to supine: Total assist   General bed mobility comments: able to assist slightly with moving legs off bed and with trunk elevation; to supine total A  Transfers                 General transfer comment: NT per pt preference due to pain  Ambulation/Gait                Stairs            Wheelchair Mobility    Modified Rankin (Stroke Patients Only)       Balance Overall balance assessment: Needs assistance Sitting-balance support: Feet supported Sitting balance-[name]: Poor Sitting balance - Comments: minguard A for sitting balance                                     Pertinent Vitals/Pain Pain Assessment: Faces Faces Pain Scale: Hurts whole lot Pain Location: L arm, back, knees Pain  Descriptors / Indicators: Aching;Grimacing;Guarding;Sore Pain Intervention(s): Monitored during session;Repositioned;Limited activity within patient's tolerance;Premedicated before session    [place] expects to be discharged to:: Skilled nursing facility                 Additional Comments: from [facility] where she was working some with PT on walker, but mainly with assist to w/c and assist to propel w/c    Prior Function Level of Independence: Needs assistance   Gait / Transfers Assistance Needed: min assist wheelchair and toilet transfers  ADL's / Homemaking Assistance Needed: A by staff at SNF        Hand Dominance   Dominant Hand: Right    Extremity/Trunk Assessment   Upper Extremity Assessment Upper Extremity Assessment: LUE deficits/detail LUE Deficits / Details: holding L arm close to her side, has wrist splint on, noted shoulder flexion AAROM about 30, elbow AROM NT due to severe pain and wrist in splint throughout LUE: Unable to fully assess due to pain;Unable to fully assess due to immobilization    Lower Extremity Assessment Lower Extremity Assessment: RLE deficits/detail;LLE deficits/detail RLE Deficits / Details: AAROM knee flexion about 60 in supine strength knee extension 3+/5 pain with testing; limited ankle DF about -20 LLE Deficits / Details: AAROM knee flexion about 60 in supine strength  knee extension 4-/5 pain with testing; limited ankle DF about -20    Cervical / Trunk Assessment Cervical / Trunk Assessment: Kyphotic  Communication   Communication: HOH  Cognition Arousal/Alertness: Awake/alert Behavior During Therapy: Anxious Overall Cognitive Status: No family/caregiver present to determine baseline cognitive functioning                                        General Comments      Exercises     Assessment/Plan    PT Assessment Patient needs continued PT services  PT Problem List Decreased  strength;Decreased mobility;Decreased range of motion;Decreased activity tolerance;Decreased balance;Decreased knowledge of use of DME;Pain;Decreased safety awareness       PT Treatment Interventions DME instruction;Therapeutic activities;Therapeutic exercise;Patient/family education;Balance training;Functional mobility training    PT Goals (Current goals can be found in the Care Plan section)  Acute Rehab PT Goals Patient Stated Goal: to return to SNF PT Goal Formulation: With patient Time For Goal Achievement: 07/25/18 Potential to Achieve Goals: Good    Frequency Min 2X/week   Barriers to discharge        Co-evaluation               AM-PAC PT "6 Clicks" Daily Activity  Outcome Measure Difficulty turning over in bed (including adjusting bedclothes, sheets and blankets)?: Unable Difficulty moving from lying on back to sitting on the side of the bed? : Unable Difficulty sitting down on and standing up from a chair with arms (e.g., wheelchair, bedside commode, etc,.)?: Unable Help needed moving to and from a bed to chair (including a wheelchair)?: Total Help needed walking in hospital room?: Total Help needed climbing 3-5 steps with a railing? : Total 6 Click Score: 6    End of Session Equipment Utilized During Treatment: Other (comment)(L wrist splint) Activity Tolerance: Patient limited by pain Patient left: in bed;with call bell/phone within reach;with bed alarm set Nurse Communication: Mobility status;Patient requests pain meds PT Visit Diagnosis: Pain;Other abnormalities of gait and mobility (R26.89);Muscle weakness (generalized) (M62.81) Pain - Right/Left: Left Pain - part of body: Arm    Time: 1152-1204(&1339-1352) PT Time Calculation (min) (ACUTE ONLY): 12 min   Charges:   PT Evaluation $PT Eval Moderate Complexity: 1 Mod PT Treatments $Therapeutic Activity: 8-22 mins        [place], [place] [phone] 07/18/2018   Reginia Naas 07/18/2018, 2:08  PM

## 2018-07-19 LAB — BASIC METABOLIC PANEL
Anion gap: 8 (ref 5–15)
BUN: 22 mg/dL (ref 8–23)
CALCIUM: 8.1 mg/dL — AB (ref 8.9–10.3)
CHLORIDE: 92 mmol/L — AB (ref 98–111)
CO2: 31 mmol/L (ref 22–32)
CREATININE: 0.58 mg/dL (ref 0.44–1.00)
GFR calc Af Amer: >60 mL/min (ref >60)
GFR calc non Af Amer: >60 mL/min (ref >60)
GLUCOSE: 137 mg/dL — AB (ref 70–99)
Potassium: 3.7 mmol/L (ref 3.5–5.1)
Sodium: 131 mmol/L — ABNORMAL LOW (ref 135–145)

## 2018-07-19 LAB — BLOOD CULTURE ID PANEL (REFLEXED)
Acinetobacter baumannii: NOT DETECTED
CANDIDA KRUSEI: NOT DETECTED
CANDIDA PARAPSILOSIS: NOT DETECTED
CANDIDA TROPICALIS: NOT DETECTED
Candida albicans: NOT DETECTED
Candida glabrata: NOT DETECTED
ESCHERICHIA COLI: NOT DETECTED
Enterobacter cloacae complex: NOT DETECTED
Enterobacteriaceae species: NOT DETECTED
Enterococcus species: NOT DETECTED
HAEMOPHILUS INFLUENZAE: NOT DETECTED
KLEBSIELLA OXYTOCA: NOT DETECTED
KLEBSIELLA PNEUMONIAE: NOT DETECTED
Listeria monocytogenes: NOT DETECTED
METHICILLIN RESISTANCE: DETECTED — AB
Neisseria meningitidis: NOT DETECTED
Proteus species: NOT DETECTED
Pseudomonas aeruginosa: NOT DETECTED
SERRATIA MARCESCENS: NOT DETECTED
STAPHYLOCOCCUS AUREUS BCID: NOT DETECTED
STREPTOCOCCUS PYOGENES: NOT DETECTED
Staphylococcus species: DETECTED — AB
Streptococcus agalactiae: NOT DETECTED
Streptococcus pneumoniae: NOT DETECTED
Streptococcus species: NOT DETECTED

## 2018-07-19 LAB — CBC
HEMATOCRIT: 27.2 % — AB (ref 36.0–46.0)
Hemoglobin: 9 g/dL — ABNORMAL LOW (ref 12.0–15.0)
MCH: 27.9 pg (ref 26.0–34.0)
MCHC: 33.1 g/dL (ref 30.0–36.0)
MCV: 84.2 fL (ref 78.0–100.0)
Platelets: 473 10*3/uL — ABNORMAL HIGH (ref 150–400)
RBC: 3.23 MIL/uL — ABNORMAL LOW (ref 3.87–5.11)
RDW: 13.6 % (ref 11.5–15.5)
WBC: 9.9 10*3/uL (ref 4.0–10.5)

## 2018-07-19 LAB — GLUCOSE, CAPILLARY: Glucose-Capillary: 130 mg/dL — ABNORMAL HIGH (ref 70–99)

## 2018-07-19 LAB — CULTURE, BLOOD (ROUTINE X 2): Special Requests: ADEQUATE

## 2018-07-19 LAB — URINE CULTURE: Culture: NO GROWTH

## 2018-07-19 NOTE — Progress Notes (Signed)
PROGRESS NOTE    [name]  TDD:[id number] DOB: 04/16/1926 DOA: 07/17/2018 PCP: Deland Pretty, MD     Brief Narrative:  [name] is a 84 y.o. female with medical history significant of HTN, HLD, diastolic CHF last EF 62% with grade 1 DD, osteoporosis, osteoarthritis, and incarcerated hiatal hernia s/p laparoscopic repair by Dr. Lucia Gaskins on 7/9 with G-tube in place; who presents with complaints of left arm pain over the last 2 days.  X-rays at the facility reported oblique fracture of the radial head of indeterminate age.  Patient notes that her whole arm hurts from the shoulder to the elbow, and down into her wrist.  Pain is approximately 7/10 on the pain scale, and worsened by any movement of the arm.  Denies any recent trauma or injury to the arm that she is aware of.  At baseline patient has been wheelchair-bound, but has been participating in physical therapy where she has been trying to walk again.  Other associated symptoms include complaints of burning with urination, urinary frequency, mild abdominal pain near surgery site, and back pain.  She has been utilizing oxycodone with to help with pain symptoms. Upon admission to the emergency department patient was found to be febrile up to 100.5 F with all other vital signs stable.  Labs revealed WBC of 13.9, hemoglobin 10.9, sodium 131, and lactic acid 1.44.  X-ray imaging showed old injuries, but no acute fracture or dislocation.  Urinalysis was positive for large leukocytes negative nitrites, few bacteria, and greater than 50 WBCs.  Patient was started on Rocephin and left wrist was splinted.   New events last 24 hours / Subjective: She states that her left wrist pain has improved.  No other new complaints today.  Assessment & Plan:   Principal Problem:   Sepsis secondary to UTI [facility]) Active Problems:   Osteoarthritis   Osteoporosis   Hyponatremia   Incarcerated hiatal hernia s/p repair/Gtube 06/07/2018   GERD (gastroesophageal  reflux disease)   Left arm pain   Sepsis secondary to ?urinary tract infection, present on admission -Initially admitted for urinary tract infection, treated with empiric Rocephin.  Urine culture returned negative  -Blood cultures obtained on admission with 1 of 4 bottles gram-positive cocci, likely contaminant.  Repeat blood cultures today.  Acute left wrist pain -X-rays with chronic appearing injuries, no acute fractures -Possible sprain of left wrist due to overuse -She states that pain has improved  Recent incarcerated hiatal hernia, GERD -Status post laparoscopic repair with placement of G-tube July 2019 -Continue tube feeds, Protonix  Anemia of chronic disease -Baseline hemoglobin 9-10 -Remains stable  Chronic diastolic heart failure -Echocardiogram showed EF 37%, grade 1 diastolic dysfunction in July 2019 -Compensated without exacerbation    DVT prophylaxis: Lovenox Code Status: DNR Family Communication: No family at bedside Disposition Plan: Pending improvement, discharge back to skilled nursing facility once improved   Consultants:   None  Procedures:   None   Antimicrobials:  Anti-infectives (From admission, onward)   Start     Dose/Rate Route Frequency Ordered Stop   07/18/18 2000  cefTRIAXone (ROCEPHIN) 1 g in sodium chloride 0.9 % 100 mL IVPB     1 g 200 mL/hr over 30 Minutes Intravenous Every 24 hours 07/17/18 2000     07/17/18 1930  cefTRIAXone (ROCEPHIN) 1 g in sodium chloride 0.9 % 100 mL IVPB     1 g 200 mL/hr over 30 Minutes Intravenous  Once 07/17/18 1921 07/17/18 2011  Objective: Vitals:   07/18/18 1501 07/18/18 2231 07/18/18 2335 07/19/18 0605  BP: 126/63 127/68  112/60  Pulse: 89 99  72  Resp: 19 18  18   Temp: 97.7 F (36.5 C) 100.3 F (37.9 C) 98.3 F (36.8 C) (!) 97.5 F (36.4 C)  TempSrc: Oral Oral  Oral  SpO2: 97% 98%  96%  Weight:    70.4 kg    Intake/Output Summary (Last 24 hours) at 07/19/2018 1254 Last data  filed at 07/19/2018 0820 Gross per 24 hour  Intake 1243.25 ml  Output 500 ml  Net 743.25 ml   Filed Weights   07/18/18 0458 07/19/18 0605  Weight: 69.2 kg 70.4 kg    Examination: General exam: Appears calm and comfortable  Respiratory system: Clear to auscultation. Respiratory effort normal. Cardiovascular system: S1 & S2 heard, RRR. No JVD, murmurs, rubs, gallops or clicks. No pedal edema. Gastrointestinal system: Abdomen is nondistended, soft and nontender. No organomegaly or masses felt. Normal bowel sounds heard. Central nervous system: Alert and oriented. No focal neurological deficits. Extremities: +left wrist in splint, with some edema over dorsal aspect of left hand, warm to touch, no erythema or point tenderness  Skin: No rashes, lesions or ulcers Psychiatry: Judgement and insight appear normal. Mood & affect appropriate.    Data Reviewed: I have personally reviewed following labs and imaging studies  CBC: Recent Labs  Lab 07/17/18 1800 07/18/18 0432 07/19/18 0522  WBC 13.9* 11.7* 9.9  NEUTROABS 10.8*  --   --   HGB 10.9* 9.9* 9.0*  HCT 33.5* 30.1* 27.2*  MCV 84.6 84.3 84.2  PLT 570* 487* 314*   Basic Metabolic Panel: Recent Labs  Lab 07/17/18 1800 07/18/18 0432 07/19/18 0522  NA 131* 131* 131*  K 4.1 4.2 3.7  CL 91* 95* 92*  CO2 30 27 31   GLUCOSE 127* 160* 137*  BUN 16 17 22   CREATININE 0.53 0.47 0.58  CALCIUM 8.9 8.6* 8.1*   GFR: Estimated Creatinine Clearance: 41.9 mL/min (by C-G formula based on SCr of 0.58 mg/dL). Liver Function Tests: Recent Labs  Lab 07/17/18 1800  AST 22  ALT 23  ALKPHOS 111  BILITOT 0.4  PROT 7.5  ALBUMIN 3.2*   No results for input(s): LIPASE, AMYLASE in the last 168 hours. No results for input(s): AMMONIA in the last 168 hours. Coagulation Profile: No results for input(s): INR, PROTIME in the last 168 hours. Cardiac Enzymes: No results for input(s): CKTOTAL, CKMB, CKMBINDEX, TROPONINI in the last 168 hours. BNP  (last 3 results) No results for input(s): PROBNP in the last 8760 hours. HbA1C: No results for input(s): HGBA1C in the last 72 hours. CBG: Recent Labs  Lab 07/18/18 0511 07/19/18 0738  GLUCAP 159* 130*   Lipid Profile: No results for input(s): CHOL, HDL, LDLCALC, TRIG, CHOLHDL, LDLDIRECT in the last 72 hours. Thyroid Function Tests: No results for input(s): TSH, T4TOTAL, FREET4, T3FREE, THYROIDAB in the last 72 hours. Anemia Panel: No results for input(s): VITAMINB12, FOLATE, FERRITIN, TIBC, IRON, RETICCTPCT in the last 72 hours. Sepsis Labs: Recent Labs  Lab 07/17/18 1753 07/17/18 2121  PROCALCITON  --  <0.10  LATICACIDVEN 1.24 0.9    Recent Results (from the past 240 hour(s))  Blood culture (routine x 2)     Status: None (Preliminary result)   Collection Time: 07/17/18  6:00 PM  Result Value Ref Range Status   Specimen Description   Final    BLOOD BLOOD RIGHT FOREARM Performed at [facility],  [place] [address] [place], [place] [address]    Special Requests   Final    BOTTLES DRAWN AEROBIC AND ANAEROBIC Blood Culture adequate volume Performed at [place] [address] [place], [place] [address]    Culture  Setup Time   Final    AEROBIC BOTTLE ONLY GRAM POSITIVE COCCI CRITICAL RESULT CALLED TO, READ BACK BY AND VERIFIED WITH: Sheffield Slider [facility] [id number] 07/19/18 A BROWNING Performed at [facility], [place] [address], [place], [place] [address]    Culture GRAM POSITIVE COCCI  Final   Report Status PENDING  Incomplete  Blood culture (routine x 2)     Status: None (Preliminary result)   Collection Time: 07/17/18  6:00 PM  Result Value Ref Range Status   Specimen Description   Final    BLOOD RIGHT ANTECUBITAL Performed at [place] [address] [place], [place] [address]    Special Requests   Final    BOTTLES DRAWN AEROBIC AND ANAEROBIC Blood Culture adequate volume Performed at [place] [address], [place], [place] [address]    Culture   Final    NO GROWTH < 24 HOURS Performed at [place] [address] [place], [place] [address]    Report Status PENDING  Incomplete  Blood Culture ID Panel (Reflexed)     Status: Abnormal   Collection Time: 07/17/18  6:00 PM  Result Value Ref Range Status   Enterococcus species NOT DETECTED NOT DETECTED Final   Listeria monocytogenes NOT DETECTED NOT DETECTED Final   Staphylococcus species DETECTED (A) NOT DETECTED Final    Comment: Methicillin (oxacillin) resistant coagulase negative staphylococcus. Possible blood culture contaminant (unless isolated from more than one blood culture draw or clinical case suggests pathogenicity). No antibiotic treatment is indicated for blood  culture contaminants. CRITICAL RESULT CALLED TO, READ BACK BY AND VERIFIED WITH: Sheffield Slider PHARMD [id number] 07/19/18 A BROWNING    Staphylococcus aureus NOT DETECTED NOT DETECTED Final   Methicillin resistance DETECTED (A) NOT DETECTED Final    Comment: CRITICAL RESULT CALLED TO, READ BACK BY AND VERIFIED WITH: Sheffield Slider PHARMD [id number] 07/19/18 A BROWNING    Streptococcus species NOT DETECTED NOT DETECTED Final   Streptococcus agalactiae NOT DETECTED NOT DETECTED Final   Streptococcus pneumoniae NOT DETECTED NOT DETECTED Final   Streptococcus pyogenes NOT DETECTED NOT DETECTED Final   Acinetobacter baumannii NOT DETECTED NOT DETECTED Final   Enterobacteriaceae species NOT DETECTED NOT DETECTED Final   Enterobacter cloacae complex NOT DETECTED NOT DETECTED Final   Escherichia coli NOT DETECTED NOT DETECTED Final   Klebsiella oxytoca NOT DETECTED NOT DETECTED Final   Klebsiella pneumoniae NOT DETECTED NOT DETECTED Final   Proteus species NOT DETECTED NOT DETECTED Final   Serratia marcescens NOT DETECTED NOT DETECTED Final   Haemophilus influenzae NOT DETECTED NOT DETECTED Final   Neisseria meningitidis NOT DETECTED NOT DETECTED  Final   Pseudomonas aeruginosa NOT DETECTED NOT DETECTED Final   Candida albicans NOT DETECTED NOT DETECTED Final   Candida glabrata NOT DETECTED NOT DETECTED Final   Candida krusei NOT DETECTED NOT DETECTED Final   Candida parapsilosis NOT DETECTED NOT DETECTED Final   Candida tropicalis NOT DETECTED NOT DETECTED Final    Comment: Performed at [facility], [place]. [address], [place], [place] [address]  Urine Culture     Status: None   Collection Time: 07/17/18  7:23 PM  Result Value Ref Range Status  Specimen Description   Final    URINE, CATHETERIZED Performed at [facility], [place] [address], [place], [place] [address]    Special Requests   Final    NONE Performed at [facility], [place] [address], [place], [place] [address]    Culture   Final    NO GROWTH Performed at [facility], [place] [address] [place], [place] [address]    Report Status 07/19/2018 FINAL  Final       Radiology Studies: Dg Chest 1 View  Result Date: 07/17/2018 CLINICAL DATA:  Pain EXAM: CHEST  1 VIEW COMPARISON:  June 02, 2018 FINDINGS: There is no edema or consolidation. Heart size and pulmonary vascularity are within normal limits. There is upper thoracic levoscoliosis with lower thoracic dextroscoliosis. There is a total shoulder replacement on the right. There is aortic atherosclerosis. IMPRESSION: No edema or consolidation. Stable cardiac silhouette. There is aortic atherosclerosis. Aortic Atherosclerosis (ICD10-I70.0). Electronically Signed   By: Lowella Grip III M.D.   On: 07/17/2018 18:51   Dg Elbow Complete Left  Result Date: 07/17/2018 CLINICAL DATA:  Pain EXAM: LEFT ELBOW - COMPLETE 3+ VIEW COMPARISON:  None. FINDINGS: Frontal, lateral, and bilateral oblique views were obtained. There is evidence of an apparent old fracture of the proximal radial metaphysis with remodeling. There is no acute fracture evident. No dislocation. No joint effusion. There  is moderate osteoarthritic change. There is spurring along the coracoid process of the proximal ulna. No erosive change. IMPRESSION: Old healed fracture with remodeling involving the proximal radial metaphysis. Moderate osteoarthritic change. No acute fracture or dislocation. No evident joint effusion. Electronically Signed   By: Lowella Grip III M.D.   On: 07/17/2018 18:54   Dg Wrist Complete Left  Result Date: 07/17/2018 CLINICAL DATA:  Pain EXAM: LEFT WRIST - COMPLETE 3+ VIEW COMPARISON:  Left hand August 31, 2004 FINDINGS: Frontal, oblique, lateral, and ulnar deviation scaphoid images were obtained. There is no acute fracture or dislocation. There is evidence of old trauma involving the distal scaphoid with remodeling. There is advanced arthropathy in the scaphotrapezial and first carpal-metacarpal joints with remodeling in these areas. There is also osteoarthritic change in the radiocarpal joint. No frank erosion. There is calcification in the triangular fibrocartilage region. IMPRESSION: No acute fracture or dislocation. Old trauma involving the distal scaphoid. Advanced arthropathy in the lateral wrist joint region. There is also osteoarthritic change in the radiocarpal joint. There is calcification in the triangular fibrocartilage region. Question chondrocalcinosis versus calcification due to chronic triangular fibrocartilage tear. Electronically Signed   By: Lowella Grip III M.D.   On: 07/17/2018 18:53   Dg Shoulder Left  Result Date: 07/17/2018 CLINICAL DATA:  Pain EXAM: LEFT SHOULDER - 2+ VIEW COMPARISON:  None. FINDINGS: Internal rotation, external rotation, and Y scapular images were obtained. There is no appreciable acute fracture or dislocation. There is diffuse joint space narrowing with superior migration of the humeral head. No erosive change. Visualized left lung is clear. There is aortic atherosclerosis. IMPRESSION: Generalized osteoarthritic change. No acute fracture or  dislocation. Superior migration of the humeral head is indicative chronic rotator cuff tear. There is aortic atherosclerosis. Aortic Atherosclerosis (ICD10-I70.0). Electronically Signed   By: Lowella Grip III M.D.   On: 07/17/2018 18:56      Scheduled Meds: . enoxaparin (LOVENOX) injection  40 mg Subcutaneous Q24H  . free water  150 mL Per Tube Q6H  . geriatric multivitamins-minerals  15 mL Per Tube Daily  .  pantoprazole sodium  40 mg Per Tube Daily  . sodium chloride flush  3 mL Intravenous Q12H   Continuous Infusions: . cefTRIAXone (ROCEPHIN)  IV Stopped (07/18/18 2044)  . feeding supplement (OSMOLITE 1.2 CAL) 1,000 mL (07/18/18 1812)     LOS: 2 days    Time spent: 25 minutes   Dessa Phi, DO [facility] [email or web address] Password [facility] 07/19/2018, 12:54 PM

## 2018-07-19 NOTE — Consult Note (Signed)
   [facility] CM Inpatient Consult   07/19/2018  APPOLLONIA KLEE [birth year] [id number]    Patient screened for potential [facility] Care Management needs due to multiple hospitalizations.   Chart reviewed. Noted patient is a long term resident at [facility]. No identifiable [facility] Care Management needs at this time.    Marthenia Rolling, MSN-Ed, RN,BSN [facility] Liaison [phone]

## 2018-07-19 NOTE — Progress Notes (Signed)
OT Cancellation Note  Patient Details Name: [name] MRN: [id number] DOB: [birth year]   Cancelled Treatment:    Reason Eval/Treat Not Completed: Other (comment)  Pt is a long term resident at SNF.  Will defer to SNF [facility], [place]  Payton Mccallum D 07/19/2018, 10:13 AM

## 2018-07-19 NOTE — Progress Notes (Signed)
PHARMACY - PHYSICIAN COMMUNICATION CRITICAL VALUE ALERT - BLOOD CULTURE IDENTIFICATION (BCID)  [name] is an 84 y.o. female who presented to [facility] on 07/17/2018 with a chief complaint of left arm pain.   Assessment:  She was febrile on admission with mild leukocytosis & started on Rocephin for possible UTI.  1/2 blood cx now growing CNS.  Pt appears to be clinically improving (afebrile, WBC trending down).  Cx result is likely contaminant.   Name of physician (or Provider) Contacted: Lamar Blinks, NP  Current antibiotics: Rocephin 1gm IV daily  Changes to prescribed antibiotics recommended: None- continue current regimen for UTI and monitor.    Results for orders placed or performed during the hospital encounter of 07/17/18  Blood Culture ID Panel (Reflexed) (Collected: 07/17/2018  6:00 PM)  Result Value Ref Range   Enterococcus species NOT DETECTED NOT DETECTED   Listeria monocytogenes NOT DETECTED NOT DETECTED   Staphylococcus species DETECTED (A) NOT DETECTED   Staphylococcus aureus NOT DETECTED NOT DETECTED   Methicillin resistance DETECTED (A) NOT DETECTED   Streptococcus species NOT DETECTED NOT DETECTED   Streptococcus agalactiae NOT DETECTED NOT DETECTED   Streptococcus pneumoniae NOT DETECTED NOT DETECTED   Streptococcus pyogenes NOT DETECTED NOT DETECTED   Acinetobacter baumannii NOT DETECTED NOT DETECTED   Enterobacteriaceae species NOT DETECTED NOT DETECTED   Enterobacter cloacae complex NOT DETECTED NOT DETECTED   Escherichia coli NOT DETECTED NOT DETECTED   Klebsiella oxytoca NOT DETECTED NOT DETECTED   Klebsiella pneumoniae NOT DETECTED NOT DETECTED   Proteus species NOT DETECTED NOT DETECTED   Serratia marcescens NOT DETECTED NOT DETECTED   Haemophilus influenzae NOT DETECTED NOT DETECTED   Neisseria meningitidis NOT DETECTED NOT DETECTED   Pseudomonas aeruginosa NOT DETECTED NOT DETECTED   Candida albicans NOT DETECTED NOT DETECTED   Candida glabrata  NOT DETECTED NOT DETECTED   Candida krusei NOT DETECTED NOT DETECTED   Candida parapsilosis NOT DETECTED NOT DETECTED   Candida tropicalis NOT DETECTED NOT DETECTED    Biagio Borg 07/19/2018  1:11 AM

## 2018-07-20 DIAGNOSIS — M2550 Pain in unspecified joint: Secondary | ICD-10-CM | POA: Diagnosis not present

## 2018-07-20 DIAGNOSIS — K440 Diaphragmatic hernia with obstruction, without gangrene: Secondary | ICD-10-CM | POA: Diagnosis not present

## 2018-07-20 DIAGNOSIS — N390 Urinary tract infection, site not specified: Secondary | ICD-10-CM

## 2018-07-20 DIAGNOSIS — A419 Sepsis, unspecified organism: Secondary | ICD-10-CM | POA: Diagnosis not present

## 2018-07-20 DIAGNOSIS — K449 Diaphragmatic hernia without obstruction or gangrene: Secondary | ICD-10-CM | POA: Diagnosis not present

## 2018-07-20 DIAGNOSIS — M79602 Pain in left arm: Secondary | ICD-10-CM

## 2018-07-20 DIAGNOSIS — L603 Nail dystrophy: Secondary | ICD-10-CM | POA: Diagnosis not present

## 2018-07-20 DIAGNOSIS — Q845 Enlarged and hypertrophic nails: Secondary | ICD-10-CM | POA: Diagnosis not present

## 2018-07-20 DIAGNOSIS — Z79899 Other long term (current) drug therapy: Secondary | ICD-10-CM | POA: Diagnosis not present

## 2018-07-20 DIAGNOSIS — R278 Other lack of coordination: Secondary | ICD-10-CM | POA: Diagnosis not present

## 2018-07-20 DIAGNOSIS — R2689 Other abnormalities of gait and mobility: Secondary | ICD-10-CM | POA: Diagnosis not present

## 2018-07-20 DIAGNOSIS — K9429 Other complications of gastrostomy: Secondary | ICD-10-CM | POA: Diagnosis not present

## 2018-07-20 DIAGNOSIS — S63509A Unspecified sprain of unspecified wrist, initial encounter: Secondary | ICD-10-CM | POA: Diagnosis not present

## 2018-07-20 DIAGNOSIS — S63592D Other specified sprain of left wrist, subsequent encounter: Secondary | ICD-10-CM | POA: Diagnosis not present

## 2018-07-20 DIAGNOSIS — I5032 Chronic diastolic (congestive) heart failure: Secondary | ICD-10-CM | POA: Diagnosis not present

## 2018-07-20 DIAGNOSIS — K567 Ileus, unspecified: Secondary | ICD-10-CM | POA: Diagnosis not present

## 2018-07-20 DIAGNOSIS — B351 Tinea unguium: Secondary | ICD-10-CM | POA: Diagnosis not present

## 2018-07-20 DIAGNOSIS — E785 Hyperlipidemia, unspecified: Secondary | ICD-10-CM | POA: Diagnosis not present

## 2018-07-20 DIAGNOSIS — M6281 Muscle weakness (generalized): Secondary | ICD-10-CM | POA: Diagnosis not present

## 2018-07-20 DIAGNOSIS — K9423 Gastrostomy malfunction: Secondary | ICD-10-CM | POA: Diagnosis not present

## 2018-07-20 DIAGNOSIS — I739 Peripheral vascular disease, unspecified: Secondary | ICD-10-CM | POA: Diagnosis not present

## 2018-07-20 DIAGNOSIS — R42 Dizziness and giddiness: Secondary | ICD-10-CM | POA: Diagnosis not present

## 2018-07-20 DIAGNOSIS — Z7401 Bed confinement status: Secondary | ICD-10-CM | POA: Diagnosis not present

## 2018-07-20 DIAGNOSIS — I10 Essential (primary) hypertension: Secondary | ICD-10-CM | POA: Diagnosis not present

## 2018-07-20 DIAGNOSIS — R300 Dysuria: Secondary | ICD-10-CM | POA: Diagnosis not present

## 2018-07-20 DIAGNOSIS — R5381 Other malaise: Secondary | ICD-10-CM | POA: Diagnosis not present

## 2018-07-20 DIAGNOSIS — N3020 Other chronic cystitis without hematuria: Secondary | ICD-10-CM | POA: Diagnosis not present

## 2018-07-20 DIAGNOSIS — E162 Hypoglycemia, unspecified: Secondary | ICD-10-CM | POA: Diagnosis not present

## 2018-07-20 DIAGNOSIS — S638X2D Sprain of other part of left wrist and hand, subsequent encounter: Secondary | ICD-10-CM | POA: Diagnosis not present

## 2018-07-20 DIAGNOSIS — Z431 Encounter for attention to gastrostomy: Secondary | ICD-10-CM | POA: Diagnosis not present

## 2018-07-20 DIAGNOSIS — T83198A Other mechanical complication of other urinary devices and implants, initial encounter: Secondary | ICD-10-CM | POA: Diagnosis not present

## 2018-07-20 DIAGNOSIS — M455 Ankylosing spondylitis of thoracolumbar region: Secondary | ICD-10-CM | POA: Diagnosis not present

## 2018-07-20 DIAGNOSIS — K219 Gastro-esophageal reflux disease without esophagitis: Secondary | ICD-10-CM | POA: Diagnosis not present

## 2018-07-20 LAB — GLUCOSE, CAPILLARY: Glucose-Capillary: 117 mg/dL — ABNORMAL HIGH (ref 70–99)

## 2018-07-20 LAB — CBC
HEMATOCRIT: 27.4 % — AB (ref 36.0–46.0)
Hemoglobin: 9 g/dL — ABNORMAL LOW (ref 12.0–15.0)
MCH: 27.6 pg (ref 26.0–34.0)
MCHC: 32.8 g/dL (ref 30.0–36.0)
MCV: 84 fL (ref 78.0–100.0)
Platelets: 494 10*3/uL — ABNORMAL HIGH (ref 150–400)
RBC: 3.26 MIL/uL — ABNORMAL LOW (ref 3.87–5.11)
RDW: 13.5 % (ref 11.5–15.5)
WBC: 7.4 10*3/uL (ref 4.0–10.5)

## 2018-07-20 LAB — BASIC METABOLIC PANEL
ANION GAP: 10 (ref 5–15)
BUN: 21 mg/dL (ref 8–23)
CHLORIDE: 93 mmol/L — AB (ref 98–111)
CO2: 29 mmol/L (ref 22–32)
Calcium: 8.2 mg/dL — ABNORMAL LOW (ref 8.9–10.3)
Creatinine, Ser: 0.55 mg/dL (ref 0.44–1.00)
GLUCOSE: 119 mg/dL — AB (ref 70–99)
Potassium: 3.6 mmol/L (ref 3.5–5.1)
SODIUM: 132 mmol/L — AB (ref 135–145)

## 2018-07-20 NOTE — Discharge Summary (Signed)
Physician Discharge Summary  [name]  SHF:[id number]  DOB: 09-15-1926  DOA: 07/17/2018 PCP: Deland Pretty, MD  Admit date: 07/17/2018 Discharge date: 07/20/2018  Admitted From: SNF  Disposition:  SNF   Recommendations for Outpatient Follow-up:  1. Follow up with SNF provider at earliest as convenience  2. Please obtain BMP/CBC in one week to monitor renal function and hemoglobin 3. Please follow up on the following pending results: Final blood culture from 07/19/2018  Discharge Condition: Stable CODE STATUS: DNR Diet recommendation: Heart Healthy   Brief/Interim Summary: For full details see H&P/Progress note, but in brief, [name] is a 84 year old female with medical history significant for hypertension, hyperlipidemia, diastolic CHF, osteoporosis and osteoarthritis.  Patient presented to the emergency department complaining of left arm pain, x-ray at the facility reported oblique fracture of radial head of indeterminate age.  Patient denies any recent trauma or injury to the arm that she is aware of.  Baseline patient is wheelchair-bound but has been participating in physical therapy.  Upon ED evaluation patient was found to be febrile with T-max of 100.5, elevated WBC at 13.9 and urinalysis concerning for UTI.  Patient was admitted with diagnosis of UTI, started on Rocephin and left wrist sprain.  Subjective: Patient seen and examined, has no complaints.  Her left wrist pain has improved.  Per nursing staff left arm seems to be swollen today.  No acute events overnight.  Discharge Diagnoses/Hospital Course:  Principal Problem:   Sepsis secondary to UTI [facility]) Active Problems:   Osteoarthritis   Osteoporosis   Hyponatremia   Incarcerated hiatal hernia s/p repair/Gtube 06/07/2018   GERD (gastroesophageal reflux disease)   Left arm pain  Sepsis felt to be secondary to UTI Empirically treated with Rocephin, urine culture was negative will discontinue antibiotics at this  time. Blood culture obtained on admission 1 out of 4 bottles with gram positive cocci, staph species methicillin resistance, felt to be contaminant.  No indication for antibiotics.  Continue to monitor.  Repeated blood cultures 8/20 pending.  Acute left wrist sprain Wrist Xray No acute fracture or dislocation. Old trauma involving the distal scaphoid. Advanced arthropathy in the lateral wrist joint region. There is also osteoarthritic change in the radiocarpal joint. There is calcification in the triangular fibrocartilage region. Question chondrocalcinosis versus calcification due to chronic triangular fibrocartilage tear.  Pain improved with pain medication and splint.  Orthopedic follow-up if no improvement as outpatient.   Incarcerated hiatal hernia Status post laparoscopic repair with placement of a G-tube on July 2019 Continue tube feeds and Protonix  Anemia of chronic disease Baseline hemoglobin 9-10, hemoglobin during hospital stay stable No signs of overt bleeding.  Chronic diastolic heart failure Echocardiogram show EF 55% with grade 1 diastolic dysfunction in July 2019 Seems to be euvolemic at this time.  All other chronic medical condition were stable during the hospitalization.  Patient was seen by physical therapy, recommending short-term rehab On the day of the discharge the patient's vitals were stable, and no other acute medical condition were reported by patient. the patient was felt safe to be discharge to SNF  Discharge Instructions  You were cared for by a hospitalist during your hospital stay. If you have any questions about your discharge medications or the care you received while you were in the hospital after you are discharged, you can call the unit and asked to speak with the hospitalist on call if the hospitalist that took care of you is not available. Once you  are discharged, your primary care physician will handle any further medical issues. Please note that NO  REFILLS for any discharge medications will be authorized once you are discharged, as it is imperative that you return to your primary care physician (or establish a relationship with a primary care physician if you do not have one) for your aftercare needs so that they can reassess your need for medications and monitor your lab values.  Discharge Instructions    Call MD for:  difficulty breathing, headache or visual disturbances   Complete by:  As directed    Call MD for:  extreme fatigue   Complete by:  As directed    Call MD for:  hives   Complete by:  As directed    Call MD for:  persistant dizziness or light-headedness   Complete by:  As directed    Call MD for:  persistant nausea and vomiting   Complete by:  As directed    Call MD for:  redness, tenderness, or signs of infection (pain, swelling, redness, odor or green/yellow discharge around incision site)   Complete by:  As directed    Call MD for:  severe uncontrolled pain   Complete by:  As directed    Call MD for:  temperature >100.4   Complete by:  As directed    Diet - low sodium heart healthy   Complete by:  As directed    Increase activity slowly   Complete by:  As directed      Allergies as of 07/20/2018      Reactions   Aspirin Other (See Comments)   Unknown   Boniva [ibandronic Acid] Other (See Comments)   Unknown   Morphine Other (See Comments)   Unknown   Nitrofuran Derivatives Other (See Comments)   Unknown   Penicillins Other (See Comments)   Received Ancef without problem  Has patient had a PCN reaction causing immediate rash, facial/tongue/throat swelling, SOB or lightheadedness with hypotension: Unknown Has patient had a PCN reaction causing severe rash involving mucus membranes or skin necrosis: Unknown Has patient had a PCN reaction that required hospitalization: Unknown Has patient had a PCN reaction occurring within the last 10 years: Unknown If all of the above answers are "NO", then may proceed  with Cephalosporin use.   Sulfonamide Derivatives Other (See Comments)   Unknown      Medication List    TAKE these medications   acetaminophen 160 MG/5ML solution Commonly known as:  TYLENOL Take 31.3 mLs (1,000 mg total) by mouth every 8 (eight) hours as needed. What changed:    how much to take  reasons to take this   estradiol 0.1 MG/GM vaginal cream Commonly known as:  ESTRACE Place 1 Applicatorful vaginally 2 (two) times a week. Mondays and Thursdays   feeding supplement (OSMOLITE 1.2 CAL) Liqd Continue tube feeding at 55 ml/hr with adjustments per your dietician at the SNF   geriatric multivitamins-minerals Liqd Place 15 mLs into feeding tube daily.   insulin aspart 100 UNIT/ML injection Commonly known as:  novoLOG Inject 0-9 Units into the skin as directed. Sliding Scale: 101-150=1 unit, 151-200=2 units, 201-250=3 units, 251-300=5 units, 301-350=7 units, >350=9 units, Call MD for BS>400 or <60   oxyCODONE 5 MG/5ML solution Commonly known as:  ROXICODONE Place 5 mg into feeding tube every 6 (six) hours as needed for moderate pain or severe pain.   pantoprazole sodium 40 mg/20 mL Pack Commonly known as:  PROTONIX Place 20 mLs (40  mg total) into feeding tube 2 (two) times daily.   polyethylene glycol packet Commonly known as:  MIRALAX / GLYCOLAX Take 17 g by mouth daily.       Contact information for follow-up providers    Deland Pretty, MD. Schedule an appointment as soon as possible for a visit in 1 week(s).   Specialty:  Internal Medicine Why:  Hospital follow-up Contact information: [address] [place] [address] [phone]            Contact information for after-discharge care    Destination    [facility] Preferred SNF .   Service:  Skilled Nursing Contact information: [place] [phone]                 Allergies  Allergen Reactions  .  Aspirin Other (See Comments)    Unknown  . Boniva [Ibandronic Acid] Other (See Comments)    Unknown  . Morphine Other (See Comments)    Unknown  . Nitrofuran Derivatives Other (See Comments)    Unknown  . Penicillins Other (See Comments)    Received Ancef without problem  Has patient had a PCN reaction causing immediate rash, facial/tongue/throat swelling, SOB or lightheadedness with hypotension: Unknown Has patient had a PCN reaction causing severe rash involving mucus membranes or skin necrosis: Unknown Has patient had a PCN reaction that required hospitalization: Unknown Has patient had a PCN reaction occurring within the last 10 years: Unknown If all of the above answers are "NO", then may proceed with Cephalosporin use.   . Sulfonamide Derivatives Other (See Comments)    Unknown    Consultations:     Procedures/Studies: Dg Chest 1 View  Result Date: 07/17/2018 CLINICAL DATA:  Pain EXAM: CHEST  1 VIEW COMPARISON:  June 02, 2018 FINDINGS: There is no edema or consolidation. Heart size and pulmonary vascularity are within normal limits. There is upper thoracic levoscoliosis with lower thoracic dextroscoliosis. There is a total shoulder replacement on the right. There is aortic atherosclerosis. IMPRESSION: No edema or consolidation. Stable cardiac silhouette. There is aortic atherosclerosis. Aortic Atherosclerosis (ICD10-I70.0). Electronically Signed   By: Lowella Grip III M.D.   On: 07/17/2018 18:51   Dg Elbow Complete Left  Result Date: 07/17/2018 CLINICAL DATA:  Pain EXAM: LEFT ELBOW - COMPLETE 3+ VIEW COMPARISON:  None. FINDINGS: Frontal, lateral, and bilateral oblique views were obtained. There is evidence of an apparent old fracture of the proximal radial metaphysis with remodeling. There is no acute fracture evident. No dislocation. No joint effusion. There is moderate osteoarthritic change. There is spurring along the coracoid process of the proximal ulna. No erosive  change. IMPRESSION: Old healed fracture with remodeling involving the proximal radial metaphysis. Moderate osteoarthritic change. No acute fracture or dislocation. No evident joint effusion. Electronically Signed   By: Lowella Grip III M.D.   On: 07/17/2018 18:54   Dg Wrist Complete Left  Result Date: 07/17/2018 CLINICAL DATA:  Pain EXAM: LEFT WRIST - COMPLETE 3+ VIEW COMPARISON:  Left hand August 31, 2004 FINDINGS: Frontal, oblique, lateral, and ulnar deviation scaphoid images were obtained. There is no acute fracture or dislocation. There is evidence of old trauma involving the distal scaphoid with remodeling. There is advanced arthropathy in the scaphotrapezial and first carpal-metacarpal joints with remodeling in these areas. There is also osteoarthritic change in the radiocarpal joint. No frank erosion. There is calcification in the triangular fibrocartilage region. IMPRESSION: No acute fracture or dislocation. Old  trauma involving the distal scaphoid. Advanced arthropathy in the lateral wrist joint region. There is also osteoarthritic change in the radiocarpal joint. There is calcification in the triangular fibrocartilage region. Question chondrocalcinosis versus calcification due to chronic triangular fibrocartilage tear. Electronically Signed   By: Lowella Grip III M.D.   On: 07/17/2018 18:53   Dg Shoulder Left  Result Date: 07/17/2018 CLINICAL DATA:  Pain EXAM: LEFT SHOULDER - 2+ VIEW COMPARISON:  None. FINDINGS: Internal rotation, external rotation, and Y scapular images were obtained. There is no appreciable acute fracture or dislocation. There is diffuse joint space narrowing with superior migration of the humeral head. No erosive change. Visualized left lung is clear. There is aortic atherosclerosis. IMPRESSION: Generalized osteoarthritic change. No acute fracture or dislocation. Superior migration of the humeral head is indicative chronic rotator cuff tear. There is aortic  atherosclerosis. Aortic Atherosclerosis (ICD10-I70.0). Electronically Signed   By: Lowella Grip III M.D.   On: 07/17/2018 18:56     Discharge Exam: Vitals:   07/19/18 2343 07/20/18 0630  BP: 111/62 (!) 108/59  Pulse: 76 73  Resp: 18 18  Temp: 98.8 F (37.1 C) 98.8 F (37.1 C)  SpO2:  95%   Vitals:   07/19/18 1446 07/19/18 2150 07/19/18 2343 07/20/18 0630  BP: 119/66 111/62 111/62 (!) 108/59  Pulse: 81 76 76 73  Resp: 18 18 18 18   Temp: 98.1 F (36.7 C) 98.8 F (37.1 C) 98.8 F (37.1 C) 98.8 F (37.1 C)  TempSrc: Oral Oral Oral Oral  SpO2: 96% 96%  95%  Weight:   70.4 kg   Height:   5\' 1"  (1.549 m)     General: NAD  Cardiovascular: RRR, S1/S2 +, no rubs, no gallops Respiratory: CTA bilaterally, no wheezing, no rhonchi Abdominal: Soft, J tube in place  Extremities: Left arm swelling dependent edema, non tender or warm. Splint in place.   The results of significant diagnostics from this hospitalization (including imaging, microbiology, ancillary and laboratory) are listed below for reference.     Microbiology: Recent Results (from the past 240 hour(s))  Blood culture (routine x 2)     Status: Abnormal   Collection Time: 07/17/18  6:00 PM  Result Value Ref Range Status   Specimen Description   Final    BLOOD BLOOD RIGHT FOREARM Performed at [facility] [address], [place], [place] [address]    Special Requests   Final    BOTTLES DRAWN AEROBIC AND ANAEROBIC Blood Culture adequate volume Performed at [facility] [address], [place], [place] [address]    Culture  Setup Time   Final    AEROBIC BOTTLE ONLY GRAM POSITIVE COCCI CRITICAL RESULT CALLED TO, READ BACK BY AND VERIFIED WITH: Sheffield Slider PHARMD [id number] 07/19/18 A BROWNING    Culture (A)  Final    STAPHYLOCOCCUS SPECIES (COAGULASE NEGATIVE) THE SIGNIFICANCE OF ISOLATING THIS ORGANISM FROM A SINGLE SET OF BLOOD CULTURES WHEN MULTIPLE SETS ARE DRAWN IS  UNCERTAIN. PLEASE NOTIFY THE MICROBIOLOGY DEPARTMENT WITHIN ONE WEEK IF SPECIATION AND SENSITIVITIES ARE REQUIRED. Performed at [facility], [place] [address], [place], [place] [address]    Report Status 07/19/2018 FINAL  Final  Blood culture (routine x 2)     Status: None (Preliminary result)   Collection Time: 07/17/18  6:00 PM  Result Value Ref Range Status   Specimen Description   Final    BLOOD RIGHT ANTECUBITAL Performed at [facility]  [address] [place], [place] [address]    Special Requests   Final    BOTTLES DRAWN AEROBIC AND ANAEROBIC Blood Culture adequate volume Performed at [facility] [address], [place], [place] [address]    Culture   Final    NO GROWTH 2 DAYS Performed at [facility] [address], [place], [place] [address]    Report Status PENDING  Incomplete  Blood Culture ID Panel (Reflexed)     Status: Abnormal   Collection Time: 07/17/18  6:00 PM  Result Value Ref Range Status   Enterococcus species NOT DETECTED NOT DETECTED Final   Listeria monocytogenes NOT DETECTED NOT DETECTED Final   Staphylococcus species DETECTED (A) NOT DETECTED Final    Comment: Methicillin (oxacillin) resistant coagulase negative staphylococcus. Possible blood culture contaminant (unless isolated from more than one blood culture draw or clinical case suggests pathogenicity). No antibiotic treatment is indicated for blood  culture contaminants. CRITICAL RESULT CALLED TO, READ BACK BY AND VERIFIED WITH: Sheffield Slider PHARMD [id number] 07/19/18 A BROWNING    Staphylococcus aureus NOT DETECTED NOT DETECTED Final   Methicillin resistance DETECTED (A) NOT DETECTED Final    Comment: CRITICAL RESULT CALLED TO, READ BACK BY AND VERIFIED WITH: Sheffield Slider PHARMD [id number] 07/19/18 A BROWNING    Streptococcus species NOT DETECTED NOT DETECTED Final   Streptococcus agalactiae NOT DETECTED NOT DETECTED Final   Streptococcus pneumoniae NOT  DETECTED NOT DETECTED Final   Streptococcus pyogenes NOT DETECTED NOT DETECTED Final   Acinetobacter baumannii NOT DETECTED NOT DETECTED Final   Enterobacteriaceae species NOT DETECTED NOT DETECTED Final   Enterobacter cloacae complex NOT DETECTED NOT DETECTED Final   Escherichia coli NOT DETECTED NOT DETECTED Final   Klebsiella oxytoca NOT DETECTED NOT DETECTED Final   Klebsiella pneumoniae NOT DETECTED NOT DETECTED Final   Proteus species NOT DETECTED NOT DETECTED Final   Serratia marcescens NOT DETECTED NOT DETECTED Final   Haemophilus influenzae NOT DETECTED NOT DETECTED Final   Neisseria meningitidis NOT DETECTED NOT DETECTED Final   Pseudomonas aeruginosa NOT DETECTED NOT DETECTED Final   Candida albicans NOT DETECTED NOT DETECTED Final   Candida glabrata NOT DETECTED NOT DETECTED Final   Candida krusei NOT DETECTED NOT DETECTED Final   Candida parapsilosis NOT DETECTED NOT DETECTED Final   Candida tropicalis NOT DETECTED NOT DETECTED Final    Comment: Performed at [facility], [place]. [address], [place], [place] [address]  Urine Culture     Status: None   Collection Time: 07/17/18  7:23 PM  Result Value Ref Range Status   Specimen Description   Final    URINE, CATHETERIZED Performed at [facility] [address], [place], [place] [address]    Special Requests   Final    NONE Performed at [facility], [place] [address], [place], [place] [address]    Culture   Final    NO GROWTH Performed at [facility], [place] [address], [place], [place] [address]    Report Status 07/19/2018 FINAL  Final     Labs: BNP (last 3 results) Recent Labs    06/03/18 0254  BNP 932.3*   Basic Metabolic Panel: Recent Labs  Lab 07/17/18 1800 07/18/18 0432 07/19/18 0522 07/20/18 0452  NA 131* 131* 131* 132*  K 4.1 4.2 3.7 3.6  CL 91* 95* 92* 93*  CO2 30 27 31 29   GLUCOSE 127* 160* 137* 119*  BUN 16 17 22  21  CREATININE 0.53 0.47 0.58  0.55  CALCIUM 8.9 8.6* 8.1* 8.2*   Liver Function Tests: Recent Labs  Lab 07/17/18 1800  AST 22  ALT 23  ALKPHOS 111  BILITOT 0.4  PROT 7.5  ALBUMIN 3.2*   No results for input(s): LIPASE, AMYLASE in the last 168 hours. No results for input(s): AMMONIA in the last 168 hours. CBC: Recent Labs  Lab 07/17/18 1800 07/18/18 0432 07/19/18 0522 07/20/18 0452  WBC 13.9* 11.7* 9.9 7.4  NEUTROABS 10.8*  --   --   --   HGB 10.9* 9.9* 9.0* 9.0*  HCT 33.5* 30.1* 27.2* 27.4*  MCV 84.6 84.3 84.2 84.0  PLT 570* 487* 473* 494*   Cardiac Enzymes: No results for input(s): CKTOTAL, CKMB, CKMBINDEX, TROPONINI in the last 168 hours. BNP: Invalid input(s): POCBNP CBG: Recent Labs  Lab 07/18/18 0511 07/19/18 0738 07/20/18 0730  GLUCAP 159* 130* 117*   D-Dimer No results for input(s): DDIMER in the last 72 hours. Hgb A1c No results for input(s): HGBA1C in the last 72 hours. Lipid Profile No results for input(s): CHOL, HDL, LDLCALC, TRIG, CHOLHDL, LDLDIRECT in the last 72 hours. Thyroid function studies No results for input(s): TSH, T4TOTAL, T3FREE, THYROIDAB in the last 72 hours.  Invalid input(s): FREET3 Anemia work up No results for input(s): VITAMINB12, FOLATE, FERRITIN, TIBC, IRON, RETICCTPCT in the last 72 hours. Urinalysis    Component Value Date/Time   COLORURINE YELLOW 07/17/2018 1654   APPEARANCEUR CLOUDY (A) 07/17/2018 1654   LABSPEC 1.015 07/17/2018 1654   PHURINE 7.0 07/17/2018 1654   GLUCOSEU NEGATIVE 07/17/2018 1654   HGBUR NEGATIVE 07/17/2018 1654   BILIRUBINUR NEGATIVE 07/17/2018 1654   KETONESUR NEGATIVE 07/17/2018 1654   PROTEINUR 30 (A) 07/17/2018 1654   UROBILINOGEN 0.2 05/05/2014 2014   NITRITE NEGATIVE 07/17/2018 1654   LEUKOCYTESUR LARGE (A) 07/17/2018 1654   Sepsis Labs Invalid input(s): PROCALCITONIN,  WBC,  LACTICIDVEN Microbiology Recent Results (from the past 240 hour(s))  Blood culture (routine x 2)     Status: Abnormal   Collection Time:  07/17/18  6:00 PM  Result Value Ref Range Status   Specimen Description   Final    BLOOD BLOOD RIGHT FOREARM Performed at [facility], [place] [address], [place], [place] [address]    Special Requests   Final    BOTTLES DRAWN AEROBIC AND ANAEROBIC Blood Culture adequate volume Performed at [facility] [address] [place], [place] [address]    Culture  Setup Time   Final    AEROBIC BOTTLE ONLY GRAM POSITIVE COCCI CRITICAL RESULT CALLED TO, READ BACK BY AND VERIFIED WITH: Sheffield Slider PHARMD [id number] 07/19/18 A BROWNING    Culture (A)  Final    STAPHYLOCOCCUS SPECIES (COAGULASE NEGATIVE) THE SIGNIFICANCE OF ISOLATING THIS ORGANISM FROM A SINGLE SET OF BLOOD CULTURES WHEN MULTIPLE SETS ARE DRAWN IS UNCERTAIN. PLEASE NOTIFY THE MICROBIOLOGY DEPARTMENT WITHIN ONE WEEK IF SPECIATION AND SENSITIVITIES ARE REQUIRED. Performed at [facility], [place] [address], [place], [place] [address]    Report Status 07/19/2018 FINAL  Final  Blood culture (routine x 2)     Status: None (Preliminary result)   Collection Time: 07/17/18  6:00 PM  Result Value Ref Range Status   Specimen Description   Final    BLOOD RIGHT ANTECUBITAL Performed at [facility] [address], [place], [place] [address]    Special Requests   Final    BOTTLES DRAWN AEROBIC AND ANAEROBIC  Blood Culture adequate volume Performed at [facility] [address], [place], [place] [address]    Culture   Final    NO GROWTH 2 DAYS Performed at [facility] [address] [place], [place] [address]    Report Status PENDING  Incomplete  Blood Culture ID Panel (Reflexed)     Status: Abnormal   Collection Time: 07/17/18  6:00 PM  Result Value Ref Range Status   Enterococcus species NOT DETECTED NOT DETECTED Final   Listeria monocytogenes NOT DETECTED NOT DETECTED Final   Staphylococcus species DETECTED (A) NOT DETECTED Final    Comment:  Methicillin (oxacillin) resistant coagulase negative staphylococcus. Possible blood culture contaminant (unless isolated from more than one blood culture draw or clinical case suggests pathogenicity). No antibiotic treatment is indicated for blood  culture contaminants. CRITICAL RESULT CALLED TO, READ BACK BY AND VERIFIED WITH: Sheffield Slider PHARMD [id number] 07/19/18 A BROWNING    Staphylococcus aureus NOT DETECTED NOT DETECTED Final   Methicillin resistance DETECTED (A) NOT DETECTED Final    Comment: CRITICAL RESULT CALLED TO, READ BACK BY AND VERIFIED WITH: Sheffield Slider PHARMD [id number] 07/19/18 A BROWNING    Streptococcus species NOT DETECTED NOT DETECTED Final   Streptococcus agalactiae NOT DETECTED NOT DETECTED Final   Streptococcus pneumoniae NOT DETECTED NOT DETECTED Final   Streptococcus pyogenes NOT DETECTED NOT DETECTED Final   Acinetobacter baumannii NOT DETECTED NOT DETECTED Final   Enterobacteriaceae species NOT DETECTED NOT DETECTED Final   Enterobacter cloacae complex NOT DETECTED NOT DETECTED Final   Escherichia coli NOT DETECTED NOT DETECTED Final   Klebsiella oxytoca NOT DETECTED NOT DETECTED Final   Klebsiella pneumoniae NOT DETECTED NOT DETECTED Final   Proteus species NOT DETECTED NOT DETECTED Final   Serratia marcescens NOT DETECTED NOT DETECTED Final   Haemophilus influenzae NOT DETECTED NOT DETECTED Final   Neisseria meningitidis NOT DETECTED NOT DETECTED Final   Pseudomonas aeruginosa NOT DETECTED NOT DETECTED Final   Candida albicans NOT DETECTED NOT DETECTED Final   Candida glabrata NOT DETECTED NOT DETECTED Final   Candida krusei NOT DETECTED NOT DETECTED Final   Candida parapsilosis NOT DETECTED NOT DETECTED Final   Candida tropicalis NOT DETECTED NOT DETECTED Final    Comment: Performed at [facility], [place]. [address], [place], [place] [address]  Urine Culture     Status: None   Collection Time: 07/17/18  7:23 PM  Result Value Ref Range Status   Specimen  Description   Final    URINE, CATHETERIZED Performed at [facility] [address], [place], [place] [address]    Special Requests   Final    NONE Performed at [facility], [place] [address] [place], [place] [address]    Culture   Final    NO GROWTH Performed at [facility], [place] [address] [place], [place] [address]    Report Status 07/19/2018 FINAL  Final    Time coordinating discharge: 32 minutes  SIGNED:  Chipper Oman, MD  [facility] 07/20/2018, 1:00 PM  Pager please text page via  [email or web address]  Note - This record has been created using [employer]. Chart creation errors have been sought, but may not always have been located. Such creation errors do not reflect on the standard of medical care.

## 2018-07-20 NOTE — Progress Notes (Signed)
Pt returning to [facility] room 214- report # [phone].  Will arrange PTAR transportation.  DC information provided via the [place].  Sharren Bridge, MSW, LCSW Clinical Social Work 07/20/2018 [phone]

## 2018-07-20 NOTE — Progress Notes (Signed)
Physical Therapy Treatment Patient Details Name: [name] MRN: [id number] DOB: March 24, 1926 Today's Date: 07/20/2018    History of Present Illness [name] is a 84 y.o. female with medical history significant of HTN, HLD, diastolic CHF last EF 85% with grade 1 DD, osteoporosis, osteoarthritis, and incarcerated hiatal hernia s/p laparoscopic repair by Dr. Lucia Gaskins on 7/9 with G-tube in place; who presents with complaints of left arm pain over the last 2 days.      PT Comments    Patient progressing this session to standing and transfers with +2 A.  Continues to need encouragement to progress as multiple complaints.  Will benefit from SNF level rehab at d/c.    Follow Up Recommendations  SNF;Supervision/Assistance - 24 hour     Equipment Recommendations  None recommended by PT    Recommendations for Other Services       Precautions / Restrictions Precautions Precautions: Fall Precaution Comments: G-tube    Mobility  Bed Mobility Overal bed mobility: Needs Assistance Bed Mobility: Supine to Sit     Supine to sit: +2 for physical assistance;Mod assist;HOB elevated     General bed mobility comments: assist to lift trunk and guide legs off bed  Transfers Overall transfer level: Needs assistance Equipment used: Rolling walker (2 wheeled) Transfers: Sit to/from [facility] Sit to Stand: Mod assist;+2 physical assistance;From elevated surface Stand pivot transfers: Mod assist;+2 physical assistance       General transfer comment: cues for technique, increased time, lifting assist from EOB and lowering assist to recliner  Ambulation/Gait                 Stairs             Wheelchair Mobility    Modified Rankin (Stroke Patients Only)       Balance Overall balance assessment: Needs assistance Sitting-balance support: Feet unsupported Sitting balance-[name]: Fair Sitting balance - Comments: S to sit EOB today   Standing  balance support: Bilateral upper extremity supported Standing balance-[name]: Poor Standing balance comment: UE support and assist for balance                            Cognition Arousal/Alertness: Awake/alert Behavior During Therapy: Anxious Overall Cognitive Status: No family/caregiver present to determine baseline cognitive functioning                                        Exercises General Exercises - Lower Extremity Ankle Circles/Pumps: AROM;10 reps;Both;Supine;PROM Heel Slides: AROM;5 reps;Both;Supine    General Comments        Pertinent Vitals/Pain Pain Score: 6  Pain Location: L arm Pain Descriptors / Indicators: Aching;Grimacing;Guarding Pain Intervention(s): Monitored during session;Repositioned;Premedicated before session    Home Living                      Prior Function            PT Goals (current goals can now be found in the care plan section) Progress towards PT goals: Progressing toward goals    Frequency           PT Plan Current plan remains appropriate    Co-evaluation              AM-PAC PT "6 Clicks" Daily Activity  Outcome Measure  Difficulty turning  over in bed (including adjusting bedclothes, sheets and blankets)?: Unable Difficulty moving from lying on back to sitting on the side of the bed? : Unable Difficulty sitting down on and standing up from a chair with arms (e.g., wheelchair, bedside commode, etc,.)?: Unable Help needed moving to and from a bed to chair (including a wheelchair)?: A Lot Help needed walking in hospital room?: Total Help needed climbing 3-5 steps with a railing? : Total 6 Click Score: 7    End of Session Equipment Utilized During Treatment: Gait belt;Other (comment)(L wrist splint) Activity Tolerance: Patient limited by pain Patient left: with call bell/phone within reach;in chair;with chair alarm set   PT Visit Diagnosis: Pain;Other abnormalities of gait  and mobility (R26.89);Muscle weakness (generalized) (M62.81)     Time: 6861-6837 PT Time Calculation (min) (ACUTE ONLY): 18 min  Charges:  $Therapeutic Activity: 8-22 mins                     [place], [place] [phone] 07/20/2018    Reginia Naas 07/20/2018, 11:35 AM

## 2018-07-20 NOTE — Progress Notes (Signed)
Report called to Summer at [facility]. All questions answered. [name] A

## 2018-07-20 NOTE — NC FL2 (Signed)
[place] MEDICAID FL2 LEVEL OF CARE SCREENING TOOL     IDENTIFICATION  Patient Name: [name] Birthdate: [birth year] Sex: female Admission Date (Current Location): 07/17/2018  [facility] and [place] Number:  Herbalist and Address:  [facility],  [place], [place]      Provider Number: [id number]  Attending Physician Name and Address:  Patrecia Pour, Christean Grief, MD  Relative Name and Phone Number:       Current Level of Care: Hospital Recommended Level of Care: [place] Prior Approval Number:    Date Approved/Denied:   PASRR Number: [id number]  Discharge Plan: SNF    Current Diagnoses: Patient Active Problem List   Diagnosis Date Noted  . Sepsis secondary to UTI ([place]) 07/18/2018  . Left arm pain 07/18/2018  . Acute lower UTI 06/08/2018  . Acute urinary retention 06/08/2018  . Gastrostomy tube in place ([place]) 06/07/2018  . Incarcerated hiatal hernia s/p repair/Gtube 06/07/2018 06/03/2018  . GERD (gastroesophageal reflux disease) 06/03/2018  . Chronic diastolic CHF (congestive heart failure) ([place]) 06/03/2018  . Acetaminophen overdose of undetermined intent 03/19/2018  . Chronic lower back pain 03/19/2018  . Acetaminophen overdose, intentional self-harm, initial encounter ([place]) 03/19/2018  . Constipation   . Generalized abdominal pain   . OSA (obstructive sleep apnea) 10/06/2016  . UTI (urinary tract infection) 05/06/2014  . Essential hypertension, benign 05/06/2014  . Humerus fracture 05/06/2014  . Dizziness 05/05/2014  . Fall 05/05/2014  . External hemorrhoid, thrombosed-left 01/24/2013  . Acute blood loss anemia 07/01/2012  . S/P left TH revision 06/28/2012  . Hyponatremia 06/11/2012  . Hip fx ([place]) 06/09/2012  . MITRAL VALVE DISORDERS 02/10/2010  . MURMUR 01/16/2010  . GERD 01/15/2010  . Osteoarthritis 01/15/2010  . BACK PAIN, CHRONIC 01/15/2010  . Osteoporosis 01/15/2010  . CHEST DISCOMFORT 01/15/2010   . GASTROINTESTINAL HEMORRHAGE, HX OF 01/15/2010    Orientation RESPIRATION BLADDER Height & Weight     Self, Situation, Place, Time  Normal Incontinent Weight: 155 lb 3.3 oz (70.4 kg) Height:  5\' 1"  (154.9 cm)  BEHAVIORAL SYMPTOMS/MOOD NEUROLOGICAL BOWEL NUTRITION STATUS      Continent Diet(55 ml/hr osmolite- Gtube feeding tube)  AMBULATORY STATUS COMMUNICATION OF NEEDS Skin   Extensive Assist Verbally Normal                       Personal Care Assistance Level of Assistance  Bathing, Feeding, Dressing Bathing Assistance: Maximum assistance Feeding assistance: Independent((feeding tube)) Dressing Assistance: Maximum assistance     Functional Limitations Info  Sight, Hearing, Speech Sight Info: Adequate Hearing Info: Adequate Speech Info: Adequate    SPECIAL CARE FACTORS FREQUENCY  PT (By licensed PT)     PT Frequency: 5x              Contractures Contractures Info: Not present    Additional Factors Info  Code Status, Allergies Code Status Info: DNR Allergies Info: Aspirin, Boniva Ibandronic Acid, Morphine, Nitrofuran Derivatives, Penicillins, Sulfonamide Derivatives           Current Medications (07/20/2018):  This is the current hospital active medication list Current Facility-Administered Medications  Medication Dose Route Frequency Provider Last Rate Last Dose  . acetaminophen (TYLENOL) solution 960 mg  960 mg Oral Q8H PRN Fuller Plan A, MD   960 mg at 07/19/18 2121  . albuterol (PROVENTIL) (2.5 MG/3ML) 0.083% nebulizer solution 2.5 mg  2.5 mg Nebulization Q6H PRN Norval Morton, MD      .  cefTRIAXone (ROCEPHIN) 1 g in sodium chloride 0.9 % 100 mL IVPB  1 g Intravenous Q24H Smith, Rondell A, MD 200 mL/hr at 07/19/18 2039 1 g at 07/19/18 2039  . enoxaparin (LOVENOX) injection 40 mg  40 mg Subcutaneous Q24H Smith, Rondell A, MD      . feeding supplement (OSMOLITE 1.2 CAL) liquid 1,000 mL  1,000 mL Per Tube Continuous Tamala Julian, Rondell A, MD 55 mL/hr at  07/20/18 0500    . free water 150 mL  150 mL Per Tube Q6H Smith, Rondell A, MD   150 mL at 07/20/18 1008  . geriatric multivitamins-minerals (ELDERTONIC/GEVRABON) liquid 15 mL  15 mL Per Tube Daily Tamala Julian, Rondell A, MD   15 mL at 07/20/18 0952  . ondansetron (ZOFRAN) tablet 4 mg  4 mg Oral Q6H PRN Fuller Plan A, MD       Or  . ondansetron (ZOFRAN) injection 4 mg  4 mg Intravenous Q6H PRN Smith, Rondell A, MD      . oxyCODONE (ROXICODONE) 5 MG/5ML solution 5 mg  5 mg Per Tube Q6H PRN Fuller Plan A, MD   5 mg at 07/20/18 0953  . pantoprazole sodium (PROTONIX) 40 mg/20 mL oral suspension 40 mg  40 mg Per Tube Daily Fuller Plan A, MD   40 mg at 07/20/18 0952  . polyethylene glycol (MIRALAX / GLYCOLAX) packet 17 g  17 g Oral Daily PRN Smith, Rondell A, MD      . sodium chloride flush (NS) 0.9 % injection 3 mL  3 mL Intravenous Q12H Tamala Julian, Rondell A, MD   3 mL at 07/20/18 2500     Discharge Medications: Please see discharge summary for a list of discharge medications.  Relevant Imaging Results:  Relevant Lab Results:   Additional Information SS# [id number]  Nila Nephew, LCSW

## 2018-07-21 DIAGNOSIS — S63592D Other specified sprain of left wrist, subsequent encounter: Secondary | ICD-10-CM | POA: Diagnosis not present

## 2018-07-21 DIAGNOSIS — A419 Sepsis, unspecified organism: Secondary | ICD-10-CM | POA: Diagnosis not present

## 2018-07-21 DIAGNOSIS — I5032 Chronic diastolic (congestive) heart failure: Secondary | ICD-10-CM | POA: Diagnosis not present

## 2018-07-21 DIAGNOSIS — K449 Diaphragmatic hernia without obstruction or gangrene: Secondary | ICD-10-CM | POA: Diagnosis not present

## 2018-07-22 LAB — CULTURE, BLOOD (ROUTINE X 2)
CULTURE: NO GROWTH
SPECIAL REQUESTS: ADEQUATE

## 2018-07-23 ENCOUNTER — Encounter (HOSPITAL_COMMUNITY): Payer: Self-pay

## 2018-07-23 ENCOUNTER — Other Ambulatory Visit: Payer: Self-pay

## 2018-07-23 ENCOUNTER — Emergency Department (HOSPITAL_COMMUNITY)
Admission: EM | Admit: 2018-07-23 | Discharge: 2018-07-23 | Disposition: A | Discharge status: Home / Self Care | Payer: Medicare Other | Attending: Emergency Medicine | Admitting: Emergency Medicine

## 2018-07-23 DIAGNOSIS — K9423 Gastrostomy malfunction: Secondary | ICD-10-CM | POA: Insufficient documentation

## 2018-07-23 DIAGNOSIS — I10 Essential (primary) hypertension: Secondary | ICD-10-CM | POA: Diagnosis not present

## 2018-07-23 DIAGNOSIS — K9429 Other complications of gastrostomy: Secondary | ICD-10-CM | POA: Diagnosis not present

## 2018-07-23 DIAGNOSIS — Z79899 Other long term (current) drug therapy: Secondary | ICD-10-CM | POA: Insufficient documentation

## 2018-07-23 DIAGNOSIS — I5032 Chronic diastolic (congestive) heart failure: Secondary | ICD-10-CM | POA: Insufficient documentation

## 2018-07-23 DIAGNOSIS — T85598A Other mechanical complication of other gastrointestinal prosthetic devices, implants and grafts, initial encounter: Secondary | ICD-10-CM

## 2018-07-23 LAB — CBG MONITORING, ED
Glucose-Capillary: 75 mg/dL (ref 70–99)
Glucose-Capillary: 93 mg/dL (ref 70–99)

## 2018-07-23 MED ORDER — OSMOLITE 1.2 CAL PO LIQD
1000.0000 mL | ORAL | Status: DC
  Administered 2018-07-23: 20 mL
  Filled 2018-07-23 (×2): qty 1000

## 2018-07-23 NOTE — ED Notes (Signed)
[facility] has arrived and are picking up patient for transport.

## 2018-07-23 NOTE — ED Triage Notes (Signed)
Pt has been transported from [facility] for PEG trouble shooting. Vitals signs WDL, pt has no other complaints.

## 2018-07-23 NOTE — Discharge Instructions (Addendum)
1.  Feeding tube has been cleared and flushed.  Resume usual use as per instructions.

## 2018-07-23 NOTE — ED Notes (Signed)
G/J tube has flushed with out any issues. ED Provider made aware.

## 2018-07-23 NOTE — ED Provider Notes (Signed)
[facility] DEPT Provider Note   CSN: [id number] Arrival date & time: 07/23/18  1552     History   Chief Complaint Chief Complaint  Patient presents with  . Peg Tube Change    HPI [name] is a 84 y.o. female.  HPI Patient sent from [facility] for obstructive feeding tube.  Patient reports that they tried all morning to unclog it without success.  She denies any complaints.  She has no pain.  She denies vomiting or fever or cough. Past Medical History:  Diagnosis Date  . Actinic keratosis   . Allergic rhinitis   . Back pain   . Chronic constipation   . Chronic diastolic CHF (congestive heart failure) ([place]) 06/03/2018  . GERD (gastroesophageal reflux disease)   . Hyperlipidemia   . Hypertension   . Hyponatremia   . Leg edema   . Macular degeneration   . Mitral regurgitation    mild by echo 2003  . Osteoarthritis   . Osteoarthrosis   . Osteoporosis   . Tenosynovitis    myxomatous    Patient Active Problem List   Diagnosis Date Noted  . Sepsis secondary to UTI ([place]) 07/18/2018  . Left arm pain 07/18/2018  . Acute lower UTI 06/08/2018  . Acute urinary retention 06/08/2018  . Gastrostomy tube in place ([place]) 06/07/2018  . Incarcerated hiatal hernia s/p repair/Gtube 06/07/2018 06/03/2018  . GERD (gastroesophageal reflux disease) 06/03/2018  . Chronic diastolic CHF (congestive heart failure) ([place]) 06/03/2018  . Acetaminophen overdose of undetermined intent 03/19/2018  . Chronic lower back pain 03/19/2018  . Acetaminophen overdose, intentional self-harm, initial encounter ([place]) 03/19/2018  . Constipation   . Generalized abdominal pain   . OSA (obstructive sleep apnea) 10/06/2016  . UTI (urinary tract infection) 05/06/2014  . Essential hypertension, benign 05/06/2014  . Humerus fracture 05/06/2014  . Dizziness 05/05/2014  . Fall 05/05/2014  . External hemorrhoid, thrombosed-left 01/24/2013  . Acute blood loss anemia 07/01/2012    . S/P left TH revision 06/28/2012  . Hyponatremia 06/11/2012  . Hip fx ([place]) 06/09/2012  . MITRAL VALVE DISORDERS 02/10/2010  . MURMUR 01/16/2010  . GERD 01/15/2010  . Osteoarthritis 01/15/2010  . BACK PAIN, CHRONIC 01/15/2010  . Osteoporosis 01/15/2010  . CHEST DISCOMFORT 01/15/2010  . GASTROINTESTINAL HEMORRHAGE, HX OF 01/15/2010    Past Surgical History:  Procedure Laterality Date  . CARPAL TUNNEL RELEASE     bilateral  . ESOPHAGOGASTRODUODENOSCOPY (EGD) WITH PROPOFOL N/A 06/05/2018   Procedure: ESOPHAGOGASTRODUODENOSCOPY (EGD) WITH PROPOFOL;  Surgeon: Otis Brace, MD;  Location: [facility];  Service: Gastroenterology;  Laterality: N/A;  . hernial repain    . HIATAL HERNIA REPAIR N/A 06/07/2018   Procedure: LAPAROSCOPIC REPAIR OF HIATAL HERNIA;  Surgeon: Alphonsa Overall, MD;  Location: [facility];  Service: General;  Laterality: N/A;  . INGUINAL HERNIA REPAIR     laparoscopic preperitoneal repair of bilateral inguinal hernias  . IR GJ TUBE CHANGE  06/20/2018  . LAPAROSCOPIC GASTROSTOMY N/A 06/07/2018   Procedure: LAPAROSCOPIC GASTROSTOMY;  Surgeon: Alphonsa Overall, MD;  Location: [facility];  Service: General;  Laterality: N/A;  . REPLACEMENT TOTAL KNEE BILATERAL    . REVERSE SHOULDER ARTHROPLASTY Right 05/08/2014   Procedure: REVERSE SHOULDER ARTHROPLASTY RIGHT ;  Surgeon: Marin Shutter, MD;  Location: [facility];  Service: Orthopedics;  Laterality: Right;  . SYNOVECTOMY     of right long finger  . tonisellectomy    . tonisillectomy    . TONSILLECTOMY  OB History   None      Home Medications    Prior to Admission medications   Medication Sig Start Date End Date Taking? Authorizing Provider  acetaminophen (TYLENOL) 160 MG/5ML solution Take 31.3 mLs (1,000 mg total) by mouth every 8 (eight) hours as needed. Patient taking differently: Take 960 mg by mouth every 8 (eight) hours as needed for moderate pain.  06/24/18  Yes Earnstine Regal, PA-C  estradiol (ESTRACE) 0.1 MG/GM  vaginal cream Place 1 Applicatorful vaginally 2 (two) times a week. Mondays and Thursdays   Yes [provider]  geriatric multivitamins-minerals (ELDERTONIC/GEVRABON) LIQD Place 15 mLs into feeding tube daily. 06/24/18  Yes Earnstine Regal, PA-C  insulin aspart (NOVOLOG) 100 UNIT/ML injection Inject 0-9 Units into the skin as directed. Sliding Scale: 101-150=1 unit, 151-200=2 units, 201-250=3 units, 251-300=5 units, 301-350=7 units, >350=9 units, Call MD for BS>400 or <60   Yes [provider]  oxyCODONE (ROXICODONE) 5 MG/5ML solution Place 5 mg into feeding tube every 6 (six) hours as needed for moderate pain or severe pain.   Yes [provider]  pantoprazole sodium (PROTONIX) 40 mg/20 mL PACK Place 20 mLs (40 mg total) into feeding tube 2 (two) times daily. 06/24/18 07/24/18 Yes Earnstine Regal, PA-C  polyethylene glycol [facility] / Floria Raveling) packet Take 17 g by mouth daily. 06/24/18  Yes Earnstine Regal, PA-C  Nutritional Supplements (FEEDING SUPPLEMENT, OSMOLITE 1.2 CAL,) LIQD Continue tube feeding at 55 ml/hr with adjustments per your dietician at the SNF Patient not taking: Reported on 07/17/2018 06/24/18   Earnstine Regal, PA-C    Family History Family History  Problem Relation Age of Onset  . Heart attack Father   . Heart attack Brother     Social History Social History   Tobacco Use  . Smoking status: Former Smoker    Packs/day: 1.00    Years: 2.00    Pack years: 2.00    Types: Cigarettes    Last attempt to quit: 12/07/1967    Years since quitting: 50.6  . Smokeless tobacco: Never Used  Substance Use Topics  . Alcohol use: Not Currently    Alcohol/week: 2.0 standard drinks    Types: 2 Glasses of wine per week    Comment: 2 glasses per week  . Drug use: No     Allergies   Aspirin; Boniva [ibandronic acid]; Morphine; Nitrofuran derivatives; Penicillins; and Sulfonamide derivatives   Review of Systems Review of Systems 10 Systems reviewed  and are negative for acute change except as noted in the HPI.   Physical Exam Updated Vital Signs BP 125/64 (BP Location: Right Arm)   Pulse 78   Resp 18   Ht 5\' 1"  (1.549 m)   Wt 70.4 kg   SpO2 96%   BMI 29.33 kg/m   Physical Exam  Constitutional:  Patient is alert and interactive.  No distress.  Central obesity and deconditioning.  HENT:  Head: Normocephalic and atraumatic.  Eyes: EOM are normal.  Cardiovascular: Normal rate, regular rhythm and normal heart sounds.  Pulmonary/Chest: Effort normal and breath sounds normal.  Abdominal: Soft. She exhibits no distension. There is no tenderness. There is no guarding.  Patient has a feeding tube in place through left mid\upper abdomen.  Ostomy site is clean and dry.  No drainage.  Small amount of visible, salmon-colored obstruction in the medication port.  Central large port is draining clear, slightly flocculent material into the drainage bag.  Patient is abdomen is soft, nondistended and nontender to  palpation.  Musculoskeletal:  Lower legs without significant edema.  Neurological: She is alert. She exhibits normal muscle tone.  Skin: Skin is warm and dry.  Psychiatric: She has a normal mood and affect.     ED Treatments / Results  Labs (all labs ordered are listed, but only abnormal results are displayed) Labs Reviewed  CBG MONITORING, ED    EKG None  Radiology No results found.  Procedures Procedures (including critical care time)  Medications Ordered in ED Medications - No data to display   Initial Impression / Assessment and Plan / ED Course  I have reviewed the triage vital signs and the nursing notes.  Pertinent labs & imaging results that were available during my care of the patient were reviewed by me and considered in my medical decision making (see chart for details).    Nursing staff has cleared tube with feeding tube declogger.  I have witnessed it flushed several times and withdrawn fluid without  any resistance.  Flowing smoothly.  Final Clinical Impressions(s) / ED Diagnoses   Final diagnoses:  Obstruction of feeding tube, initial encounter  Patient is clinically at baseline with no acute complaints.  Feeding tube has been cleared of obstruction.  Maxie Better to return to nursing care.  ED Discharge Orders    None       Charlesetta Shanks, MD 07/23/18 1744

## 2018-07-23 NOTE — ED Notes (Signed)
[facility] has been contacted and will be in route to pick patient up ASAP.

## 2018-07-24 LAB — CULTURE, BLOOD (ROUTINE X 2)
CULTURE: NO GROWTH
Culture: NO GROWTH
SPECIAL REQUESTS: ADEQUATE

## 2018-07-27 DIAGNOSIS — I10 Essential (primary) hypertension: Secondary | ICD-10-CM | POA: Diagnosis not present

## 2018-07-27 DIAGNOSIS — I5032 Chronic diastolic (congestive) heart failure: Secondary | ICD-10-CM | POA: Diagnosis not present

## 2018-07-27 DIAGNOSIS — R300 Dysuria: Secondary | ICD-10-CM | POA: Diagnosis not present

## 2018-07-27 DIAGNOSIS — S63509A Unspecified sprain of unspecified wrist, initial encounter: Secondary | ICD-10-CM | POA: Diagnosis not present

## 2018-07-27 DIAGNOSIS — K449 Diaphragmatic hernia without obstruction or gangrene: Secondary | ICD-10-CM | POA: Diagnosis not present

## 2018-07-27 DIAGNOSIS — E785 Hyperlipidemia, unspecified: Secondary | ICD-10-CM | POA: Diagnosis not present

## 2018-07-27 DIAGNOSIS — N390 Urinary tract infection, site not specified: Secondary | ICD-10-CM | POA: Diagnosis not present

## 2018-08-03 DIAGNOSIS — N390 Urinary tract infection, site not specified: Secondary | ICD-10-CM | POA: Diagnosis not present

## 2018-08-03 DIAGNOSIS — I5032 Chronic diastolic (congestive) heart failure: Secondary | ICD-10-CM | POA: Diagnosis not present

## 2018-08-03 DIAGNOSIS — K449 Diaphragmatic hernia without obstruction or gangrene: Secondary | ICD-10-CM | POA: Diagnosis not present

## 2018-08-03 DIAGNOSIS — K567 Ileus, unspecified: Secondary | ICD-10-CM | POA: Diagnosis not present

## 2018-08-12 DIAGNOSIS — N3020 Other chronic cystitis without hematuria: Secondary | ICD-10-CM | POA: Diagnosis not present

## 2018-08-12 DIAGNOSIS — I10 Essential (primary) hypertension: Secondary | ICD-10-CM | POA: Diagnosis not present

## 2018-08-12 DIAGNOSIS — K567 Ileus, unspecified: Secondary | ICD-10-CM | POA: Diagnosis not present

## 2018-08-12 DIAGNOSIS — K449 Diaphragmatic hernia without obstruction or gangrene: Secondary | ICD-10-CM | POA: Diagnosis not present

## 2018-08-17 DIAGNOSIS — I10 Essential (primary) hypertension: Secondary | ICD-10-CM | POA: Diagnosis not present

## 2018-08-17 DIAGNOSIS — K449 Diaphragmatic hernia without obstruction or gangrene: Secondary | ICD-10-CM | POA: Diagnosis not present

## 2018-08-17 DIAGNOSIS — R42 Dizziness and giddiness: Secondary | ICD-10-CM | POA: Diagnosis not present

## 2018-08-17 DIAGNOSIS — K567 Ileus, unspecified: Secondary | ICD-10-CM | POA: Diagnosis not present

## 2018-08-24 ENCOUNTER — Other Ambulatory Visit (HOSPITAL_COMMUNITY): Payer: Self-pay | Admitting: Surgery

## 2018-08-24 DIAGNOSIS — K449 Diaphragmatic hernia without obstruction or gangrene: Secondary | ICD-10-CM | POA: Diagnosis not present

## 2018-08-24 DIAGNOSIS — I10 Essential (primary) hypertension: Secondary | ICD-10-CM | POA: Diagnosis not present

## 2018-08-24 DIAGNOSIS — Z8719 Personal history of other diseases of the digestive system: Secondary | ICD-10-CM

## 2018-08-24 DIAGNOSIS — K567 Ileus, unspecified: Secondary | ICD-10-CM | POA: Diagnosis not present

## 2018-08-24 DIAGNOSIS — I5032 Chronic diastolic (congestive) heart failure: Secondary | ICD-10-CM | POA: Diagnosis not present

## 2018-08-24 DIAGNOSIS — Z9889 Other specified postprocedural states: Principal | ICD-10-CM

## 2018-08-29 ENCOUNTER — Encounter (HOSPITAL_COMMUNITY): Payer: Self-pay | Admitting: Interventional Radiology

## 2018-08-29 ENCOUNTER — Ambulatory Visit (HOSPITAL_COMMUNITY)
Admission: RE | Admit: 2018-08-29 | Discharge: 2018-08-29 | Disposition: A | Discharge status: Home / Self Care | Payer: Medicare Other | Source: Ambulatory Visit | Attending: Surgery | Admitting: Surgery

## 2018-08-29 DIAGNOSIS — Z9889 Other specified postprocedural states: Secondary | ICD-10-CM

## 2018-08-29 DIAGNOSIS — K9423 Gastrostomy malfunction: Secondary | ICD-10-CM | POA: Insufficient documentation

## 2018-08-29 DIAGNOSIS — Z8719 Personal history of other diseases of the digestive system: Secondary | ICD-10-CM

## 2018-08-29 DIAGNOSIS — K9413 Enterostomy malfunction: Secondary | ICD-10-CM | POA: Diagnosis not present

## 2018-08-29 HISTORY — PX: IR REPLC GASTRO/COLONIC TUBE PERCUT W/FLUORO: IMG2333

## 2018-08-29 MED ORDER — LIDOCAINE VISCOUS HCL 2 % MT SOLN
OROMUCOSAL | Status: AC
  Filled 2018-08-29: qty 15

## 2018-08-29 MED ORDER — IOPAMIDOL (ISOVUE-300) INJECTION 61%
INTRAVENOUS | Status: AC
  Administered 2018-08-29: 20 mL
  Filled 2018-08-29: qty 50

## 2018-08-29 NOTE — Procedures (Signed)
Interventional Radiology Procedure Note  Procedure:  Image guided exchange of 61F G-J, with amputation of the distal tip.  Complications: None Recommendations:  - Ok to use - dc home - Routine care  Signed,  Dulcy Fanny. Earleen Newport, DO

## 2018-08-30 DIAGNOSIS — K567 Ileus, unspecified: Secondary | ICD-10-CM | POA: Diagnosis not present

## 2018-08-30 DIAGNOSIS — B372 Candidiasis of skin and nail: Secondary | ICD-10-CM | POA: Diagnosis not present

## 2018-08-30 DIAGNOSIS — K449 Diaphragmatic hernia without obstruction or gangrene: Secondary | ICD-10-CM | POA: Diagnosis not present

## 2018-08-30 DIAGNOSIS — R21 Rash and other nonspecific skin eruption: Secondary | ICD-10-CM | POA: Diagnosis not present

## 2018-09-02 DIAGNOSIS — R21 Rash and other nonspecific skin eruption: Secondary | ICD-10-CM | POA: Diagnosis not present

## 2018-09-02 DIAGNOSIS — K567 Ileus, unspecified: Secondary | ICD-10-CM | POA: Diagnosis not present

## 2018-09-02 DIAGNOSIS — B372 Candidiasis of skin and nail: Secondary | ICD-10-CM | POA: Diagnosis not present

## 2018-09-02 DIAGNOSIS — K219 Gastro-esophageal reflux disease without esophagitis: Secondary | ICD-10-CM | POA: Diagnosis not present

## 2018-09-06 DIAGNOSIS — K449 Diaphragmatic hernia without obstruction or gangrene: Secondary | ICD-10-CM | POA: Diagnosis not present

## 2018-09-06 DIAGNOSIS — B372 Candidiasis of skin and nail: Secondary | ICD-10-CM | POA: Diagnosis not present

## 2018-09-06 DIAGNOSIS — K567 Ileus, unspecified: Secondary | ICD-10-CM | POA: Diagnosis not present

## 2018-09-06 DIAGNOSIS — I10 Essential (primary) hypertension: Secondary | ICD-10-CM | POA: Diagnosis not present

## 2018-09-13 DIAGNOSIS — I10 Essential (primary) hypertension: Secondary | ICD-10-CM | POA: Diagnosis not present

## 2018-09-13 DIAGNOSIS — B372 Candidiasis of skin and nail: Secondary | ICD-10-CM | POA: Diagnosis not present

## 2018-09-13 DIAGNOSIS — K449 Diaphragmatic hernia without obstruction or gangrene: Secondary | ICD-10-CM | POA: Diagnosis not present

## 2018-09-13 DIAGNOSIS — K219 Gastro-esophageal reflux disease without esophagitis: Secondary | ICD-10-CM | POA: Diagnosis not present

## 2018-09-14 DIAGNOSIS — I509 Heart failure, unspecified: Secondary | ICD-10-CM | POA: Diagnosis not present

## 2018-09-14 DIAGNOSIS — I10 Essential (primary) hypertension: Secondary | ICD-10-CM | POA: Diagnosis not present

## 2018-09-14 DIAGNOSIS — E785 Hyperlipidemia, unspecified: Secondary | ICD-10-CM | POA: Diagnosis not present

## 2018-10-06 DIAGNOSIS — B351 Tinea unguium: Secondary | ICD-10-CM | POA: Diagnosis not present

## 2018-10-06 DIAGNOSIS — R600 Localized edema: Secondary | ICD-10-CM | POA: Diagnosis not present

## 2018-10-06 DIAGNOSIS — I739 Peripheral vascular disease, unspecified: Secondary | ICD-10-CM | POA: Diagnosis not present

## 2018-10-06 DIAGNOSIS — L603 Nail dystrophy: Secondary | ICD-10-CM | POA: Diagnosis not present

## 2018-10-06 DIAGNOSIS — Q845 Enlarged and hypertrophic nails: Secondary | ICD-10-CM | POA: Diagnosis not present

## 2018-10-13 DIAGNOSIS — Z09 Encounter for follow-up examination after completed treatment for conditions other than malignant neoplasm: Secondary | ICD-10-CM | POA: Diagnosis not present

## 2018-10-13 DIAGNOSIS — Z931 Gastrostomy status: Secondary | ICD-10-CM | POA: Diagnosis not present

## 2018-10-13 DIAGNOSIS — R5381 Other malaise: Secondary | ICD-10-CM | POA: Diagnosis not present

## 2018-10-13 DIAGNOSIS — Z8719 Personal history of other diseases of the digestive system: Secondary | ICD-10-CM | POA: Diagnosis not present

## 2018-10-19 DIAGNOSIS — I5032 Chronic diastolic (congestive) heart failure: Secondary | ICD-10-CM | POA: Diagnosis not present

## 2018-10-19 DIAGNOSIS — B373 Candidiasis of vulva and vagina: Secondary | ICD-10-CM | POA: Diagnosis not present

## 2018-10-19 DIAGNOSIS — R600 Localized edema: Secondary | ICD-10-CM | POA: Diagnosis not present

## 2018-10-19 DIAGNOSIS — I10 Essential (primary) hypertension: Secondary | ICD-10-CM | POA: Diagnosis not present

## 2018-10-26 DIAGNOSIS — Z79899 Other long term (current) drug therapy: Secondary | ICD-10-CM | POA: Diagnosis not present

## 2018-10-30 DIAGNOSIS — I10 Essential (primary) hypertension: Secondary | ICD-10-CM | POA: Diagnosis not present

## 2018-10-30 DIAGNOSIS — M6281 Muscle weakness (generalized): Secondary | ICD-10-CM | POA: Diagnosis not present

## 2018-10-30 DIAGNOSIS — R278 Other lack of coordination: Secondary | ICD-10-CM | POA: Diagnosis not present

## 2018-10-30 DIAGNOSIS — K219 Gastro-esophageal reflux disease without esophagitis: Secondary | ICD-10-CM | POA: Diagnosis not present

## 2018-10-30 DIAGNOSIS — M455 Ankylosing spondylitis of thoracolumbar region: Secondary | ICD-10-CM | POA: Diagnosis not present

## 2018-10-30 DIAGNOSIS — Z431 Encounter for attention to gastrostomy: Secondary | ICD-10-CM | POA: Diagnosis not present

## 2018-10-30 DIAGNOSIS — R2689 Other abnormalities of gait and mobility: Secondary | ICD-10-CM | POA: Diagnosis not present

## 2018-10-30 DIAGNOSIS — S638X2D Sprain of other part of left wrist and hand, subsequent encounter: Secondary | ICD-10-CM | POA: Diagnosis not present

## 2018-10-31 DIAGNOSIS — M455 Ankylosing spondylitis of thoracolumbar region: Secondary | ICD-10-CM | POA: Diagnosis not present

## 2018-10-31 DIAGNOSIS — R2689 Other abnormalities of gait and mobility: Secondary | ICD-10-CM | POA: Diagnosis not present

## 2018-10-31 DIAGNOSIS — K219 Gastro-esophageal reflux disease without esophagitis: Secondary | ICD-10-CM | POA: Diagnosis not present

## 2018-10-31 DIAGNOSIS — Z431 Encounter for attention to gastrostomy: Secondary | ICD-10-CM | POA: Diagnosis not present

## 2018-10-31 DIAGNOSIS — M6281 Muscle weakness (generalized): Secondary | ICD-10-CM | POA: Diagnosis not present

## 2018-10-31 DIAGNOSIS — R278 Other lack of coordination: Secondary | ICD-10-CM | POA: Diagnosis not present

## 2018-11-01 DIAGNOSIS — R278 Other lack of coordination: Secondary | ICD-10-CM | POA: Diagnosis not present

## 2018-11-01 DIAGNOSIS — Z431 Encounter for attention to gastrostomy: Secondary | ICD-10-CM | POA: Diagnosis not present

## 2018-11-01 DIAGNOSIS — K219 Gastro-esophageal reflux disease without esophagitis: Secondary | ICD-10-CM | POA: Diagnosis not present

## 2018-11-01 DIAGNOSIS — M6281 Muscle weakness (generalized): Secondary | ICD-10-CM | POA: Diagnosis not present

## 2018-11-01 DIAGNOSIS — R2689 Other abnormalities of gait and mobility: Secondary | ICD-10-CM | POA: Diagnosis not present

## 2018-11-01 DIAGNOSIS — M455 Ankylosing spondylitis of thoracolumbar region: Secondary | ICD-10-CM | POA: Diagnosis not present

## 2018-11-02 DIAGNOSIS — R2689 Other abnormalities of gait and mobility: Secondary | ICD-10-CM | POA: Diagnosis not present

## 2018-11-02 DIAGNOSIS — R278 Other lack of coordination: Secondary | ICD-10-CM | POA: Diagnosis not present

## 2018-11-02 DIAGNOSIS — M6281 Muscle weakness (generalized): Secondary | ICD-10-CM | POA: Diagnosis not present

## 2018-11-02 DIAGNOSIS — M455 Ankylosing spondylitis of thoracolumbar region: Secondary | ICD-10-CM | POA: Diagnosis not present

## 2018-11-02 DIAGNOSIS — Z431 Encounter for attention to gastrostomy: Secondary | ICD-10-CM | POA: Diagnosis not present

## 2018-11-02 DIAGNOSIS — K219 Gastro-esophageal reflux disease without esophagitis: Secondary | ICD-10-CM | POA: Diagnosis not present

## 2018-11-03 DIAGNOSIS — R278 Other lack of coordination: Secondary | ICD-10-CM | POA: Diagnosis not present

## 2018-11-03 DIAGNOSIS — Z431 Encounter for attention to gastrostomy: Secondary | ICD-10-CM | POA: Diagnosis not present

## 2018-11-03 DIAGNOSIS — M6281 Muscle weakness (generalized): Secondary | ICD-10-CM | POA: Diagnosis not present

## 2018-11-03 DIAGNOSIS — M455 Ankylosing spondylitis of thoracolumbar region: Secondary | ICD-10-CM | POA: Diagnosis not present

## 2018-11-03 DIAGNOSIS — R2689 Other abnormalities of gait and mobility: Secondary | ICD-10-CM | POA: Diagnosis not present

## 2018-11-03 DIAGNOSIS — K219 Gastro-esophageal reflux disease without esophagitis: Secondary | ICD-10-CM | POA: Diagnosis not present

## 2018-11-04 DIAGNOSIS — I10 Essential (primary) hypertension: Secondary | ICD-10-CM | POA: Diagnosis not present

## 2018-11-04 DIAGNOSIS — I5032 Chronic diastolic (congestive) heart failure: Secondary | ICD-10-CM | POA: Diagnosis not present

## 2018-11-04 DIAGNOSIS — K219 Gastro-esophageal reflux disease without esophagitis: Secondary | ICD-10-CM | POA: Diagnosis not present

## 2018-11-04 DIAGNOSIS — R21 Rash and other nonspecific skin eruption: Secondary | ICD-10-CM | POA: Diagnosis not present

## 2018-11-07 DIAGNOSIS — M455 Ankylosing spondylitis of thoracolumbar region: Secondary | ICD-10-CM | POA: Diagnosis not present

## 2018-11-07 DIAGNOSIS — R2689 Other abnormalities of gait and mobility: Secondary | ICD-10-CM | POA: Diagnosis not present

## 2018-11-07 DIAGNOSIS — R278 Other lack of coordination: Secondary | ICD-10-CM | POA: Diagnosis not present

## 2018-11-07 DIAGNOSIS — M6281 Muscle weakness (generalized): Secondary | ICD-10-CM | POA: Diagnosis not present

## 2018-11-07 DIAGNOSIS — K219 Gastro-esophageal reflux disease without esophagitis: Secondary | ICD-10-CM | POA: Diagnosis not present

## 2018-11-07 DIAGNOSIS — Z431 Encounter for attention to gastrostomy: Secondary | ICD-10-CM | POA: Diagnosis not present

## 2018-11-08 DIAGNOSIS — M6281 Muscle weakness (generalized): Secondary | ICD-10-CM | POA: Diagnosis not present

## 2018-11-08 DIAGNOSIS — Z431 Encounter for attention to gastrostomy: Secondary | ICD-10-CM | POA: Diagnosis not present

## 2018-11-08 DIAGNOSIS — M455 Ankylosing spondylitis of thoracolumbar region: Secondary | ICD-10-CM | POA: Diagnosis not present

## 2018-11-08 DIAGNOSIS — R278 Other lack of coordination: Secondary | ICD-10-CM | POA: Diagnosis not present

## 2018-11-08 DIAGNOSIS — R2689 Other abnormalities of gait and mobility: Secondary | ICD-10-CM | POA: Diagnosis not present

## 2018-11-08 DIAGNOSIS — K219 Gastro-esophageal reflux disease without esophagitis: Secondary | ICD-10-CM | POA: Diagnosis not present

## 2018-11-09 DIAGNOSIS — I10 Essential (primary) hypertension: Secondary | ICD-10-CM | POA: Diagnosis not present

## 2018-11-09 DIAGNOSIS — R2689 Other abnormalities of gait and mobility: Secondary | ICD-10-CM | POA: Diagnosis not present

## 2018-11-09 DIAGNOSIS — I509 Heart failure, unspecified: Secondary | ICD-10-CM | POA: Diagnosis not present

## 2018-11-09 DIAGNOSIS — E785 Hyperlipidemia, unspecified: Secondary | ICD-10-CM | POA: Diagnosis not present

## 2018-11-09 DIAGNOSIS — Z66 Do not resuscitate: Secondary | ICD-10-CM | POA: Diagnosis not present

## 2018-11-09 DIAGNOSIS — M455 Ankylosing spondylitis of thoracolumbar region: Secondary | ICD-10-CM | POA: Diagnosis not present

## 2018-11-09 DIAGNOSIS — G4730 Sleep apnea, unspecified: Secondary | ICD-10-CM | POA: Diagnosis not present

## 2018-11-09 DIAGNOSIS — K219 Gastro-esophageal reflux disease without esophagitis: Secondary | ICD-10-CM | POA: Diagnosis not present

## 2018-11-09 DIAGNOSIS — Z431 Encounter for attention to gastrostomy: Secondary | ICD-10-CM | POA: Diagnosis not present

## 2018-11-09 DIAGNOSIS — R278 Other lack of coordination: Secondary | ICD-10-CM | POA: Diagnosis not present

## 2018-11-09 DIAGNOSIS — M6281 Muscle weakness (generalized): Secondary | ICD-10-CM | POA: Diagnosis not present

## 2018-11-10 DIAGNOSIS — Z431 Encounter for attention to gastrostomy: Secondary | ICD-10-CM | POA: Diagnosis not present

## 2018-11-10 DIAGNOSIS — M6281 Muscle weakness (generalized): Secondary | ICD-10-CM | POA: Diagnosis not present

## 2018-11-10 DIAGNOSIS — M455 Ankylosing spondylitis of thoracolumbar region: Secondary | ICD-10-CM | POA: Diagnosis not present

## 2018-11-10 DIAGNOSIS — K219 Gastro-esophageal reflux disease without esophagitis: Secondary | ICD-10-CM | POA: Diagnosis not present

## 2018-11-10 DIAGNOSIS — R278 Other lack of coordination: Secondary | ICD-10-CM | POA: Diagnosis not present

## 2018-11-10 DIAGNOSIS — R2689 Other abnormalities of gait and mobility: Secondary | ICD-10-CM | POA: Diagnosis not present

## 2018-11-11 DIAGNOSIS — M455 Ankylosing spondylitis of thoracolumbar region: Secondary | ICD-10-CM | POA: Diagnosis not present

## 2018-11-11 DIAGNOSIS — R278 Other lack of coordination: Secondary | ICD-10-CM | POA: Diagnosis not present

## 2018-11-11 DIAGNOSIS — Z431 Encounter for attention to gastrostomy: Secondary | ICD-10-CM | POA: Diagnosis not present

## 2018-11-11 DIAGNOSIS — K219 Gastro-esophageal reflux disease without esophagitis: Secondary | ICD-10-CM | POA: Diagnosis not present

## 2018-11-11 DIAGNOSIS — R2689 Other abnormalities of gait and mobility: Secondary | ICD-10-CM | POA: Diagnosis not present

## 2018-11-11 DIAGNOSIS — M6281 Muscle weakness (generalized): Secondary | ICD-10-CM | POA: Diagnosis not present

## 2018-11-14 DIAGNOSIS — M6281 Muscle weakness (generalized): Secondary | ICD-10-CM | POA: Diagnosis not present

## 2018-11-14 DIAGNOSIS — R2689 Other abnormalities of gait and mobility: Secondary | ICD-10-CM | POA: Diagnosis not present

## 2018-11-14 DIAGNOSIS — Z431 Encounter for attention to gastrostomy: Secondary | ICD-10-CM | POA: Diagnosis not present

## 2018-11-14 DIAGNOSIS — M455 Ankylosing spondylitis of thoracolumbar region: Secondary | ICD-10-CM | POA: Diagnosis not present

## 2018-11-14 DIAGNOSIS — K219 Gastro-esophageal reflux disease without esophagitis: Secondary | ICD-10-CM | POA: Diagnosis not present

## 2018-11-14 DIAGNOSIS — R278 Other lack of coordination: Secondary | ICD-10-CM | POA: Diagnosis not present

## 2018-11-15 DIAGNOSIS — R2689 Other abnormalities of gait and mobility: Secondary | ICD-10-CM | POA: Diagnosis not present

## 2018-11-15 DIAGNOSIS — Z431 Encounter for attention to gastrostomy: Secondary | ICD-10-CM | POA: Diagnosis not present

## 2018-11-15 DIAGNOSIS — M455 Ankylosing spondylitis of thoracolumbar region: Secondary | ICD-10-CM | POA: Diagnosis not present

## 2018-11-15 DIAGNOSIS — R278 Other lack of coordination: Secondary | ICD-10-CM | POA: Diagnosis not present

## 2018-11-15 DIAGNOSIS — K219 Gastro-esophageal reflux disease without esophagitis: Secondary | ICD-10-CM | POA: Diagnosis not present

## 2018-11-15 DIAGNOSIS — M6281 Muscle weakness (generalized): Secondary | ICD-10-CM | POA: Diagnosis not present

## 2018-11-16 DIAGNOSIS — R2689 Other abnormalities of gait and mobility: Secondary | ICD-10-CM | POA: Diagnosis not present

## 2018-11-16 DIAGNOSIS — M6281 Muscle weakness (generalized): Secondary | ICD-10-CM | POA: Diagnosis not present

## 2018-11-16 DIAGNOSIS — Z431 Encounter for attention to gastrostomy: Secondary | ICD-10-CM | POA: Diagnosis not present

## 2018-11-16 DIAGNOSIS — K219 Gastro-esophageal reflux disease without esophagitis: Secondary | ICD-10-CM | POA: Diagnosis not present

## 2018-11-16 DIAGNOSIS — M455 Ankylosing spondylitis of thoracolumbar region: Secondary | ICD-10-CM | POA: Diagnosis not present

## 2018-11-16 DIAGNOSIS — R278 Other lack of coordination: Secondary | ICD-10-CM | POA: Diagnosis not present

## 2018-11-17 DIAGNOSIS — M455 Ankylosing spondylitis of thoracolumbar region: Secondary | ICD-10-CM | POA: Diagnosis not present

## 2018-11-17 DIAGNOSIS — R278 Other lack of coordination: Secondary | ICD-10-CM | POA: Diagnosis not present

## 2018-11-17 DIAGNOSIS — R2689 Other abnormalities of gait and mobility: Secondary | ICD-10-CM | POA: Diagnosis not present

## 2018-11-17 DIAGNOSIS — K219 Gastro-esophageal reflux disease without esophagitis: Secondary | ICD-10-CM | POA: Diagnosis not present

## 2018-11-17 DIAGNOSIS — Z431 Encounter for attention to gastrostomy: Secondary | ICD-10-CM | POA: Diagnosis not present

## 2018-11-17 DIAGNOSIS — M6281 Muscle weakness (generalized): Secondary | ICD-10-CM | POA: Diagnosis not present

## 2018-11-18 DIAGNOSIS — R278 Other lack of coordination: Secondary | ICD-10-CM | POA: Diagnosis not present

## 2018-11-18 DIAGNOSIS — K219 Gastro-esophageal reflux disease without esophagitis: Secondary | ICD-10-CM | POA: Diagnosis not present

## 2018-11-18 DIAGNOSIS — M6281 Muscle weakness (generalized): Secondary | ICD-10-CM | POA: Diagnosis not present

## 2018-11-18 DIAGNOSIS — R2689 Other abnormalities of gait and mobility: Secondary | ICD-10-CM | POA: Diagnosis not present

## 2018-11-18 DIAGNOSIS — M455 Ankylosing spondylitis of thoracolumbar region: Secondary | ICD-10-CM | POA: Diagnosis not present

## 2018-11-18 DIAGNOSIS — Z431 Encounter for attention to gastrostomy: Secondary | ICD-10-CM | POA: Diagnosis not present

## 2018-11-20 DIAGNOSIS — R2689 Other abnormalities of gait and mobility: Secondary | ICD-10-CM | POA: Diagnosis not present

## 2018-11-20 DIAGNOSIS — M6281 Muscle weakness (generalized): Secondary | ICD-10-CM | POA: Diagnosis not present

## 2018-11-20 DIAGNOSIS — K219 Gastro-esophageal reflux disease without esophagitis: Secondary | ICD-10-CM | POA: Diagnosis not present

## 2018-11-20 DIAGNOSIS — R278 Other lack of coordination: Secondary | ICD-10-CM | POA: Diagnosis not present

## 2018-11-20 DIAGNOSIS — M455 Ankylosing spondylitis of thoracolumbar region: Secondary | ICD-10-CM | POA: Diagnosis not present

## 2018-11-20 DIAGNOSIS — Z431 Encounter for attention to gastrostomy: Secondary | ICD-10-CM | POA: Diagnosis not present

## 2018-11-21 DIAGNOSIS — M455 Ankylosing spondylitis of thoracolumbar region: Secondary | ICD-10-CM | POA: Diagnosis not present

## 2018-11-21 DIAGNOSIS — R278 Other lack of coordination: Secondary | ICD-10-CM | POA: Diagnosis not present

## 2018-11-21 DIAGNOSIS — R2689 Other abnormalities of gait and mobility: Secondary | ICD-10-CM | POA: Diagnosis not present

## 2018-11-21 DIAGNOSIS — Z431 Encounter for attention to gastrostomy: Secondary | ICD-10-CM | POA: Diagnosis not present

## 2018-11-21 DIAGNOSIS — K219 Gastro-esophageal reflux disease without esophagitis: Secondary | ICD-10-CM | POA: Diagnosis not present

## 2018-11-21 DIAGNOSIS — M6281 Muscle weakness (generalized): Secondary | ICD-10-CM | POA: Diagnosis not present

## 2018-11-22 DIAGNOSIS — Z431 Encounter for attention to gastrostomy: Secondary | ICD-10-CM | POA: Diagnosis not present

## 2018-11-22 DIAGNOSIS — R2689 Other abnormalities of gait and mobility: Secondary | ICD-10-CM | POA: Diagnosis not present

## 2018-11-22 DIAGNOSIS — K219 Gastro-esophageal reflux disease without esophagitis: Secondary | ICD-10-CM | POA: Diagnosis not present

## 2018-11-22 DIAGNOSIS — M6281 Muscle weakness (generalized): Secondary | ICD-10-CM | POA: Diagnosis not present

## 2018-11-22 DIAGNOSIS — M455 Ankylosing spondylitis of thoracolumbar region: Secondary | ICD-10-CM | POA: Diagnosis not present

## 2018-11-22 DIAGNOSIS — R278 Other lack of coordination: Secondary | ICD-10-CM | POA: Diagnosis not present

## 2018-11-24 DIAGNOSIS — Z431 Encounter for attention to gastrostomy: Secondary | ICD-10-CM | POA: Diagnosis not present

## 2018-11-24 DIAGNOSIS — M455 Ankylosing spondylitis of thoracolumbar region: Secondary | ICD-10-CM | POA: Diagnosis not present

## 2018-11-24 DIAGNOSIS — R278 Other lack of coordination: Secondary | ICD-10-CM | POA: Diagnosis not present

## 2018-11-24 DIAGNOSIS — M6281 Muscle weakness (generalized): Secondary | ICD-10-CM | POA: Diagnosis not present

## 2018-11-24 DIAGNOSIS — R2689 Other abnormalities of gait and mobility: Secondary | ICD-10-CM | POA: Diagnosis not present

## 2018-11-24 DIAGNOSIS — K219 Gastro-esophageal reflux disease without esophagitis: Secondary | ICD-10-CM | POA: Diagnosis not present

## 2018-11-25 DIAGNOSIS — M6281 Muscle weakness (generalized): Secondary | ICD-10-CM | POA: Diagnosis not present

## 2018-11-25 DIAGNOSIS — Z431 Encounter for attention to gastrostomy: Secondary | ICD-10-CM | POA: Diagnosis not present

## 2018-11-25 DIAGNOSIS — R278 Other lack of coordination: Secondary | ICD-10-CM | POA: Diagnosis not present

## 2018-11-25 DIAGNOSIS — M455 Ankylosing spondylitis of thoracolumbar region: Secondary | ICD-10-CM | POA: Diagnosis not present

## 2018-11-25 DIAGNOSIS — R2689 Other abnormalities of gait and mobility: Secondary | ICD-10-CM | POA: Diagnosis not present

## 2018-11-25 DIAGNOSIS — K219 Gastro-esophageal reflux disease without esophagitis: Secondary | ICD-10-CM | POA: Diagnosis not present

## 2018-11-28 DIAGNOSIS — M6281 Muscle weakness (generalized): Secondary | ICD-10-CM | POA: Diagnosis not present

## 2018-11-28 DIAGNOSIS — M455 Ankylosing spondylitis of thoracolumbar region: Secondary | ICD-10-CM | POA: Diagnosis not present

## 2018-11-28 DIAGNOSIS — Z431 Encounter for attention to gastrostomy: Secondary | ICD-10-CM | POA: Diagnosis not present

## 2018-11-28 DIAGNOSIS — K219 Gastro-esophageal reflux disease without esophagitis: Secondary | ICD-10-CM | POA: Diagnosis not present

## 2018-11-28 DIAGNOSIS — R278 Other lack of coordination: Secondary | ICD-10-CM | POA: Diagnosis not present

## 2018-11-28 DIAGNOSIS — R2689 Other abnormalities of gait and mobility: Secondary | ICD-10-CM | POA: Diagnosis not present

## 2018-11-29 DIAGNOSIS — R2689 Other abnormalities of gait and mobility: Secondary | ICD-10-CM | POA: Diagnosis not present

## 2018-11-29 DIAGNOSIS — Z431 Encounter for attention to gastrostomy: Secondary | ICD-10-CM | POA: Diagnosis not present

## 2018-11-29 DIAGNOSIS — R278 Other lack of coordination: Secondary | ICD-10-CM | POA: Diagnosis not present

## 2018-11-29 DIAGNOSIS — M455 Ankylosing spondylitis of thoracolumbar region: Secondary | ICD-10-CM | POA: Diagnosis not present

## 2018-11-29 DIAGNOSIS — M6281 Muscle weakness (generalized): Secondary | ICD-10-CM | POA: Diagnosis not present

## 2018-11-29 DIAGNOSIS — K219 Gastro-esophageal reflux disease without esophagitis: Secondary | ICD-10-CM | POA: Diagnosis not present

## 2018-11-30 DIAGNOSIS — Z79899 Other long term (current) drug therapy: Secondary | ICD-10-CM | POA: Diagnosis not present

## 2018-11-30 DIAGNOSIS — R319 Hematuria, unspecified: Secondary | ICD-10-CM | POA: Diagnosis not present

## 2018-11-30 DIAGNOSIS — N390 Urinary tract infection, site not specified: Secondary | ICD-10-CM | POA: Diagnosis not present

## 2018-11-30 DIAGNOSIS — D390 Neoplasm of uncertain behavior of uterus: Secondary | ICD-10-CM | POA: Diagnosis not present

## 2018-11-30 DIAGNOSIS — I10 Essential (primary) hypertension: Secondary | ICD-10-CM | POA: Diagnosis not present

## 2018-12-01 DIAGNOSIS — S638X2D Sprain of other part of left wrist and hand, subsequent encounter: Secondary | ICD-10-CM | POA: Diagnosis not present

## 2018-12-01 DIAGNOSIS — M455 Ankylosing spondylitis of thoracolumbar region: Secondary | ICD-10-CM | POA: Diagnosis not present

## 2018-12-01 DIAGNOSIS — K219 Gastro-esophageal reflux disease without esophagitis: Secondary | ICD-10-CM | POA: Diagnosis not present

## 2018-12-01 DIAGNOSIS — Z431 Encounter for attention to gastrostomy: Secondary | ICD-10-CM | POA: Diagnosis not present

## 2018-12-01 DIAGNOSIS — R2689 Other abnormalities of gait and mobility: Secondary | ICD-10-CM | POA: Diagnosis not present

## 2018-12-01 DIAGNOSIS — M6281 Muscle weakness (generalized): Secondary | ICD-10-CM | POA: Diagnosis not present

## 2018-12-01 DIAGNOSIS — I10 Essential (primary) hypertension: Secondary | ICD-10-CM | POA: Diagnosis not present

## 2018-12-01 DIAGNOSIS — R278 Other lack of coordination: Secondary | ICD-10-CM | POA: Diagnosis not present

## 2018-12-02 DIAGNOSIS — K219 Gastro-esophageal reflux disease without esophagitis: Secondary | ICD-10-CM | POA: Diagnosis not present

## 2018-12-02 DIAGNOSIS — R278 Other lack of coordination: Secondary | ICD-10-CM | POA: Diagnosis not present

## 2018-12-02 DIAGNOSIS — M455 Ankylosing spondylitis of thoracolumbar region: Secondary | ICD-10-CM | POA: Diagnosis not present

## 2018-12-02 DIAGNOSIS — R2689 Other abnormalities of gait and mobility: Secondary | ICD-10-CM | POA: Diagnosis not present

## 2018-12-02 DIAGNOSIS — M6281 Muscle weakness (generalized): Secondary | ICD-10-CM | POA: Diagnosis not present

## 2018-12-02 DIAGNOSIS — Z431 Encounter for attention to gastrostomy: Secondary | ICD-10-CM | POA: Diagnosis not present

## 2018-12-03 DIAGNOSIS — Z431 Encounter for attention to gastrostomy: Secondary | ICD-10-CM | POA: Diagnosis not present

## 2018-12-03 DIAGNOSIS — R278 Other lack of coordination: Secondary | ICD-10-CM | POA: Diagnosis not present

## 2018-12-03 DIAGNOSIS — M6281 Muscle weakness (generalized): Secondary | ICD-10-CM | POA: Diagnosis not present

## 2018-12-03 DIAGNOSIS — K219 Gastro-esophageal reflux disease without esophagitis: Secondary | ICD-10-CM | POA: Diagnosis not present

## 2018-12-03 DIAGNOSIS — R2689 Other abnormalities of gait and mobility: Secondary | ICD-10-CM | POA: Diagnosis not present

## 2018-12-03 DIAGNOSIS — M455 Ankylosing spondylitis of thoracolumbar region: Secondary | ICD-10-CM | POA: Diagnosis not present

## 2018-12-04 DIAGNOSIS — R278 Other lack of coordination: Secondary | ICD-10-CM | POA: Diagnosis not present

## 2018-12-04 DIAGNOSIS — M6281 Muscle weakness (generalized): Secondary | ICD-10-CM | POA: Diagnosis not present

## 2018-12-04 DIAGNOSIS — Z431 Encounter for attention to gastrostomy: Secondary | ICD-10-CM | POA: Diagnosis not present

## 2018-12-04 DIAGNOSIS — K219 Gastro-esophageal reflux disease without esophagitis: Secondary | ICD-10-CM | POA: Diagnosis not present

## 2018-12-04 DIAGNOSIS — M455 Ankylosing spondylitis of thoracolumbar region: Secondary | ICD-10-CM | POA: Diagnosis not present

## 2018-12-04 DIAGNOSIS — R2689 Other abnormalities of gait and mobility: Secondary | ICD-10-CM | POA: Diagnosis not present

## 2018-12-05 DIAGNOSIS — M455 Ankylosing spondylitis of thoracolumbar region: Secondary | ICD-10-CM | POA: Diagnosis not present

## 2018-12-05 DIAGNOSIS — R278 Other lack of coordination: Secondary | ICD-10-CM | POA: Diagnosis not present

## 2018-12-05 DIAGNOSIS — R2689 Other abnormalities of gait and mobility: Secondary | ICD-10-CM | POA: Diagnosis not present

## 2018-12-05 DIAGNOSIS — K219 Gastro-esophageal reflux disease without esophagitis: Secondary | ICD-10-CM | POA: Diagnosis not present

## 2018-12-05 DIAGNOSIS — M6281 Muscle weakness (generalized): Secondary | ICD-10-CM | POA: Diagnosis not present

## 2018-12-05 DIAGNOSIS — Z431 Encounter for attention to gastrostomy: Secondary | ICD-10-CM | POA: Diagnosis not present

## 2018-12-07 DIAGNOSIS — I5032 Chronic diastolic (congestive) heart failure: Secondary | ICD-10-CM | POA: Diagnosis not present

## 2018-12-07 DIAGNOSIS — R05 Cough: Secondary | ICD-10-CM | POA: Diagnosis not present

## 2018-12-07 DIAGNOSIS — K219 Gastro-esophageal reflux disease without esophagitis: Secondary | ICD-10-CM | POA: Diagnosis not present

## 2018-12-07 DIAGNOSIS — I10 Essential (primary) hypertension: Secondary | ICD-10-CM | POA: Diagnosis not present

## 2018-12-26 DIAGNOSIS — I5032 Chronic diastolic (congestive) heart failure: Secondary | ICD-10-CM | POA: Diagnosis not present

## 2018-12-26 DIAGNOSIS — L292 Pruritus vulvae: Secondary | ICD-10-CM | POA: Diagnosis not present

## 2018-12-26 DIAGNOSIS — R05 Cough: Secondary | ICD-10-CM | POA: Diagnosis not present

## 2018-12-26 DIAGNOSIS — K219 Gastro-esophageal reflux disease without esophagitis: Secondary | ICD-10-CM | POA: Diagnosis not present

## 2018-12-27 DIAGNOSIS — J811 Chronic pulmonary edema: Secondary | ICD-10-CM | POA: Diagnosis not present

## 2018-12-27 DIAGNOSIS — R05 Cough: Secondary | ICD-10-CM | POA: Diagnosis not present

## 2018-12-29 IMAGING — CR DG SHOULDER 2+V*L*
3 series · 3 of 3 positions shown · non-contrast
Comparison: None.

CLINICAL DATA: Pain

EXAM:
LEFT SHOULDER - 2+ VIEW

[x shoulder ap left (1 of 3)]
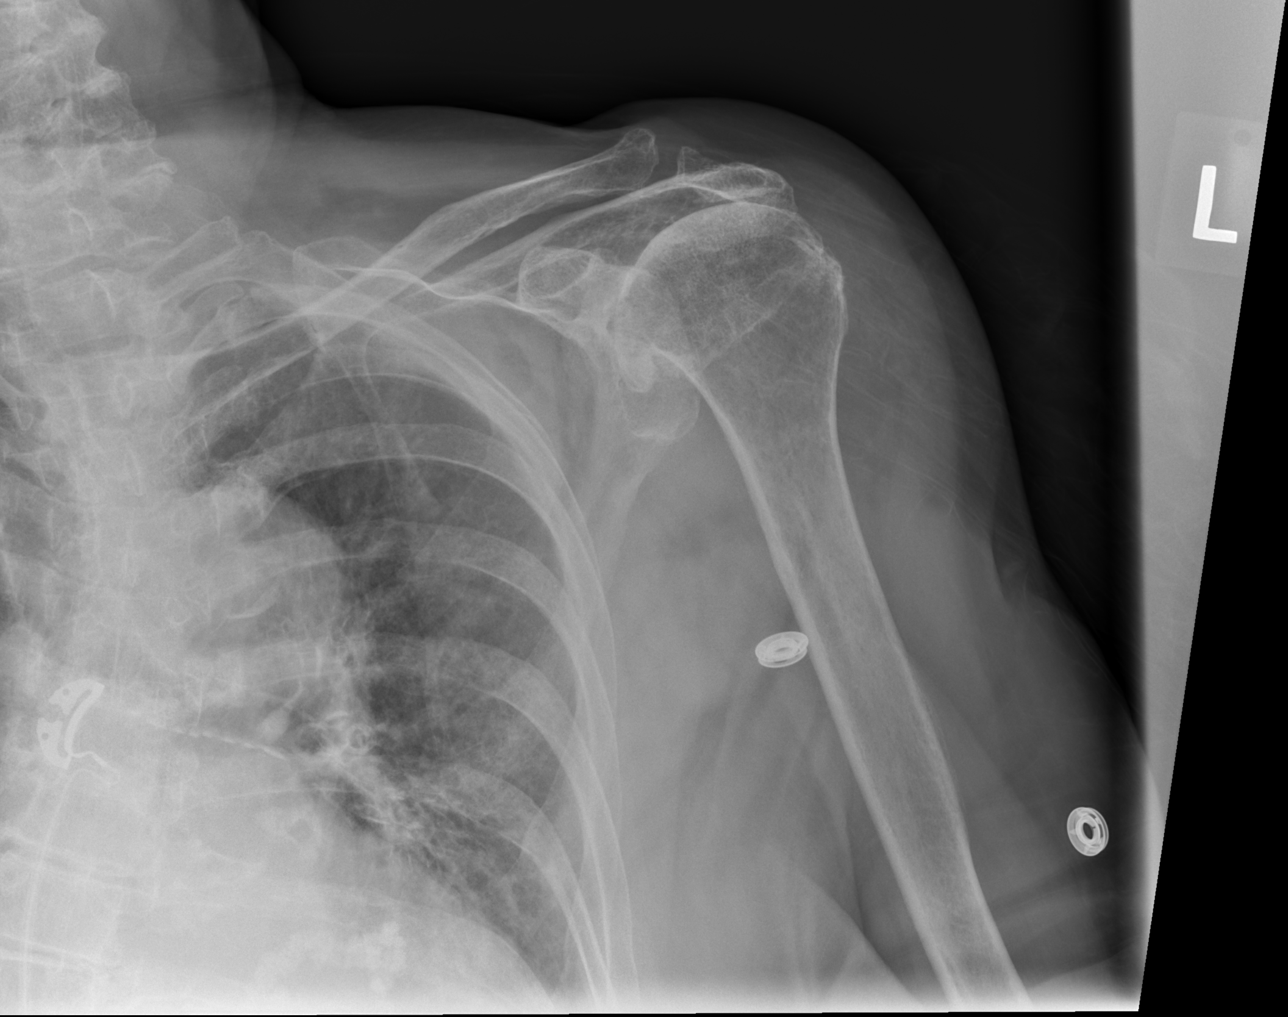

[x shoulder ap left (2 of 3)]
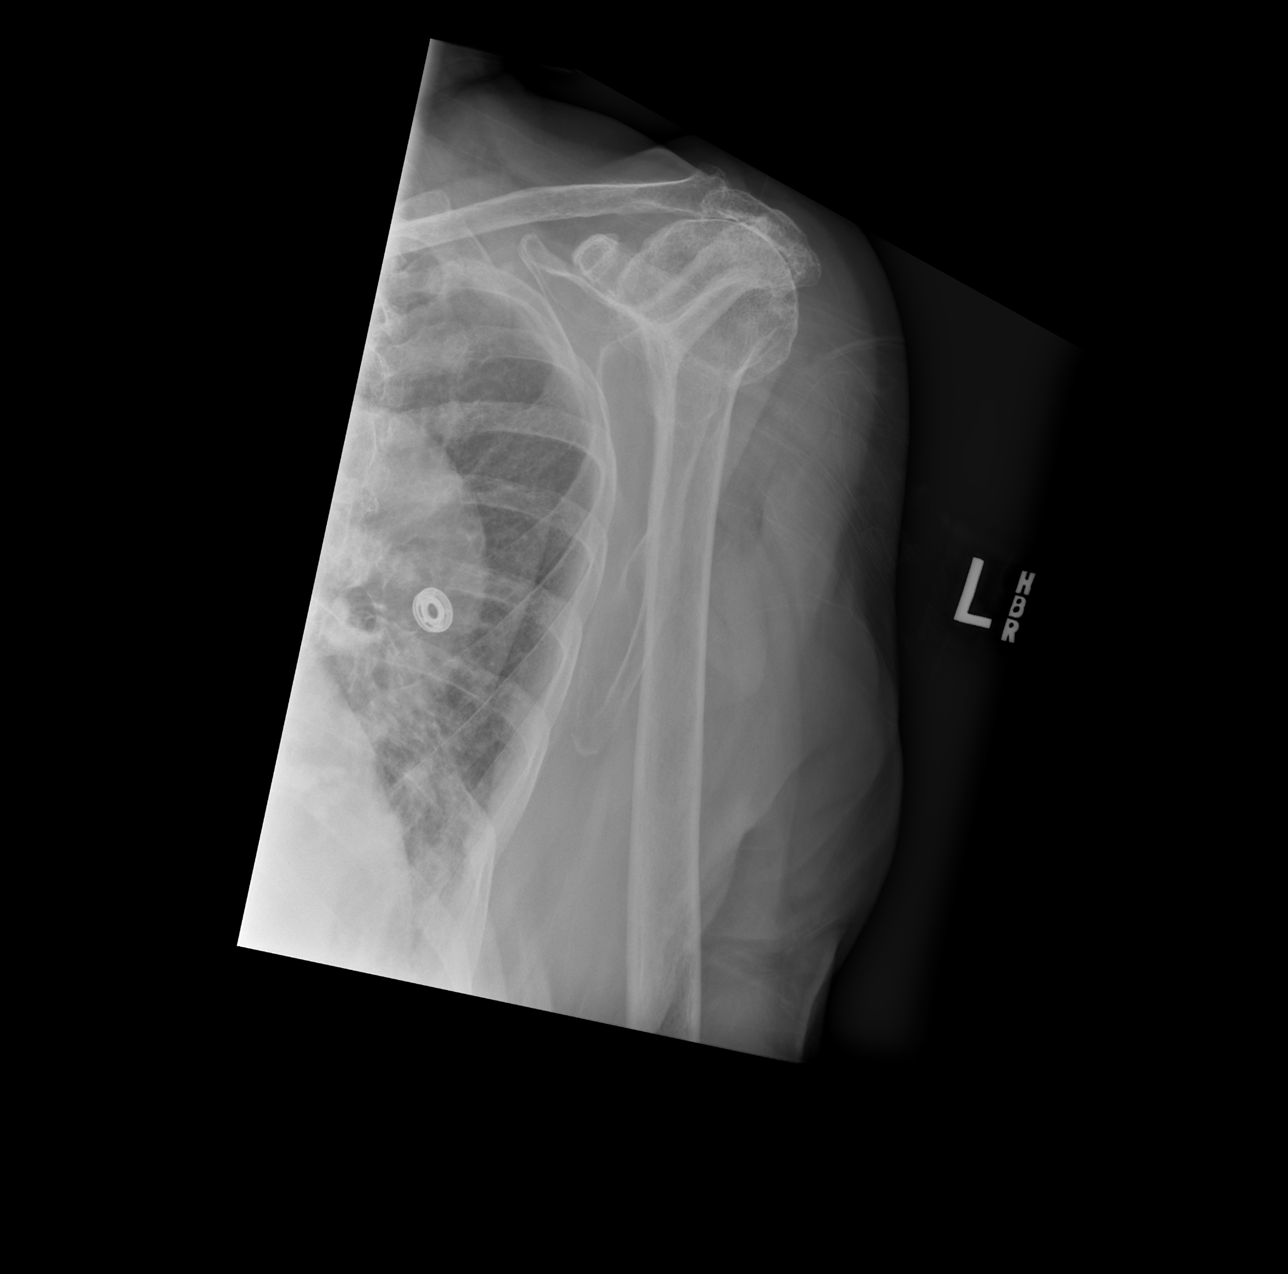

[x shoulder ap left (3 of 3)]
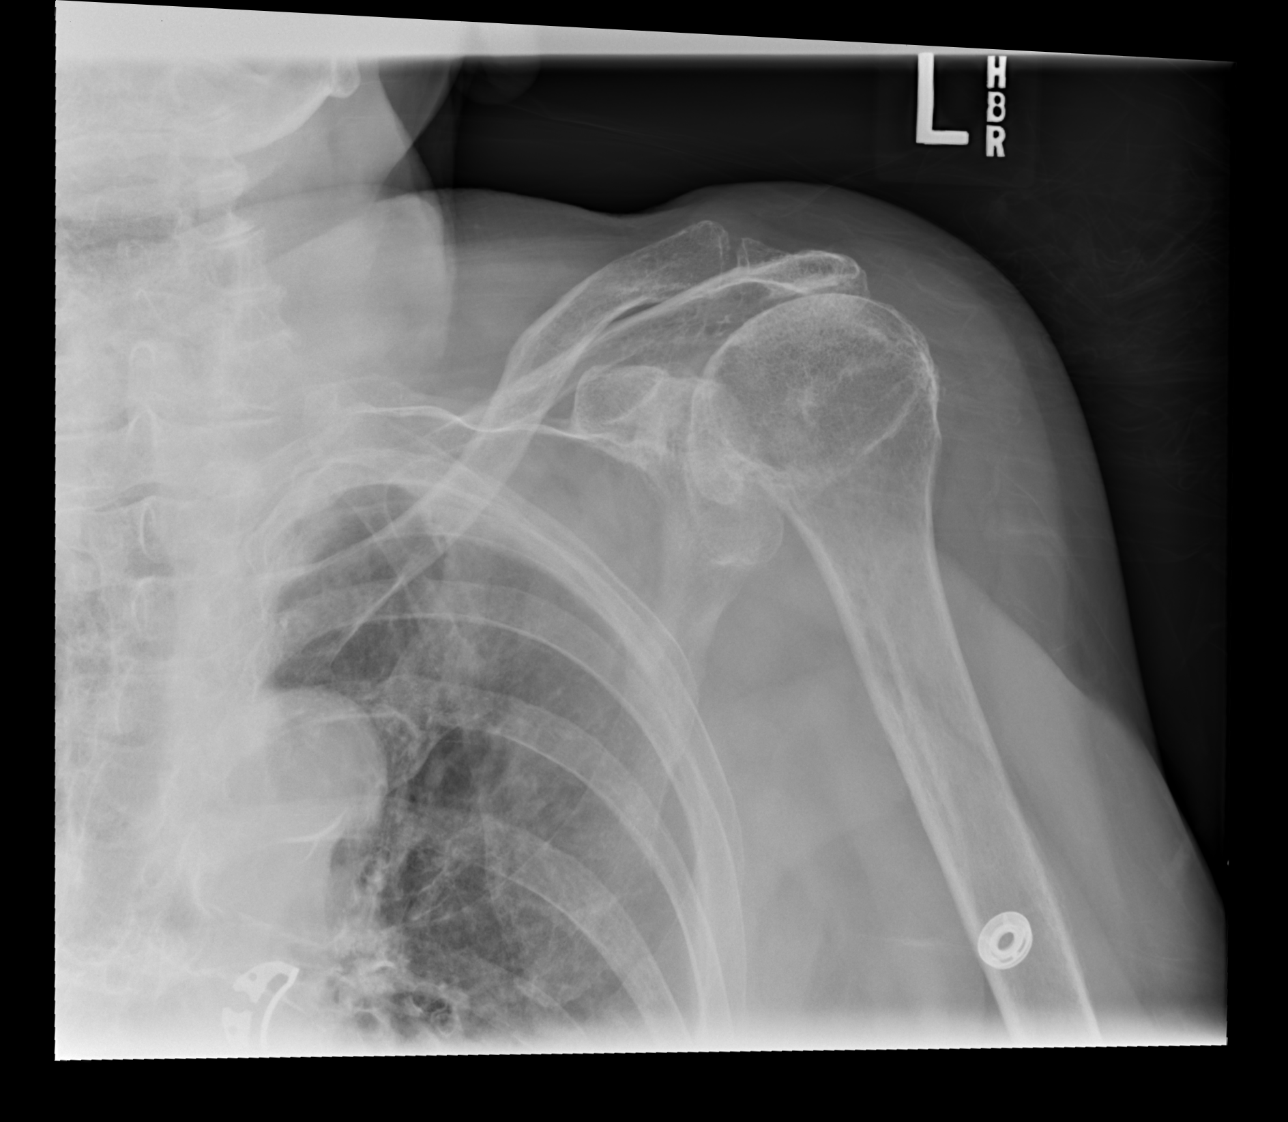

[3 of 3 positions shown; findings below may reference images not displayed]

FINDINGS: Internal rotation, external rotation, and Y scapular images were
obtained. There is no appreciable acute fracture or dislocation.
There is diffuse joint space narrowing with superior migration of
the humeral head. No erosive change. Visualized left lung is clear.
There is aortic atherosclerosis.
IMPRESSION: Generalized osteoarthritic change. No acute fracture or dislocation.
Superior migration of the humeral head is indicative chronic rotator
cuff tear. There is aortic atherosclerosis.

Aortic Atherosclerosis ([id number]).

## 2019-01-02 DIAGNOSIS — R300 Dysuria: Secondary | ICD-10-CM | POA: Diagnosis not present

## 2019-01-02 DIAGNOSIS — N3020 Other chronic cystitis without hematuria: Secondary | ICD-10-CM | POA: Diagnosis not present

## 2019-01-02 DIAGNOSIS — N323 Diverticulum of bladder: Secondary | ICD-10-CM | POA: Diagnosis not present

## 2019-01-02 DIAGNOSIS — L292 Pruritus vulvae: Secondary | ICD-10-CM | POA: Diagnosis not present

## 2019-01-03 DIAGNOSIS — L603 Nail dystrophy: Secondary | ICD-10-CM | POA: Diagnosis not present

## 2019-01-03 DIAGNOSIS — I739 Peripheral vascular disease, unspecified: Secondary | ICD-10-CM | POA: Diagnosis not present

## 2019-01-03 DIAGNOSIS — N390 Urinary tract infection, site not specified: Secondary | ICD-10-CM | POA: Diagnosis not present

## 2019-01-03 DIAGNOSIS — R319 Hematuria, unspecified: Secondary | ICD-10-CM | POA: Diagnosis not present

## 2019-01-03 DIAGNOSIS — Z79899 Other long term (current) drug therapy: Secondary | ICD-10-CM | POA: Diagnosis not present

## 2019-01-03 DIAGNOSIS — Q845 Enlarged and hypertrophic nails: Secondary | ICD-10-CM | POA: Diagnosis not present

## 2019-01-03 DIAGNOSIS — B351 Tinea unguium: Secondary | ICD-10-CM | POA: Diagnosis not present

## 2019-01-04 DIAGNOSIS — I10 Essential (primary) hypertension: Secondary | ICD-10-CM | POA: Diagnosis not present

## 2019-01-04 DIAGNOSIS — F329 Major depressive disorder, single episode, unspecified: Secondary | ICD-10-CM | POA: Diagnosis not present

## 2019-01-04 DIAGNOSIS — E785 Hyperlipidemia, unspecified: Secondary | ICD-10-CM | POA: Diagnosis not present

## 2019-01-05 DIAGNOSIS — I10 Essential (primary) hypertension: Secondary | ICD-10-CM | POA: Diagnosis not present

## 2019-01-05 DIAGNOSIS — N323 Diverticulum of bladder: Secondary | ICD-10-CM | POA: Diagnosis not present

## 2019-01-05 DIAGNOSIS — N390 Urinary tract infection, site not specified: Secondary | ICD-10-CM | POA: Diagnosis not present

## 2019-01-05 DIAGNOSIS — N3020 Other chronic cystitis without hematuria: Secondary | ICD-10-CM | POA: Diagnosis not present

## 2019-01-16 DIAGNOSIS — N390 Urinary tract infection, site not specified: Secondary | ICD-10-CM | POA: Diagnosis not present

## 2019-01-16 DIAGNOSIS — G4709 Other insomnia: Secondary | ICD-10-CM | POA: Diagnosis not present

## 2019-01-16 DIAGNOSIS — N323 Diverticulum of bladder: Secondary | ICD-10-CM | POA: Diagnosis not present

## 2019-01-16 DIAGNOSIS — N3020 Other chronic cystitis without hematuria: Secondary | ICD-10-CM | POA: Diagnosis not present

## 2019-01-24 DIAGNOSIS — R278 Other lack of coordination: Secondary | ICD-10-CM | POA: Diagnosis not present

## 2019-01-24 DIAGNOSIS — Z431 Encounter for attention to gastrostomy: Secondary | ICD-10-CM | POA: Diagnosis not present

## 2019-01-24 DIAGNOSIS — R2689 Other abnormalities of gait and mobility: Secondary | ICD-10-CM | POA: Diagnosis not present

## 2019-01-24 DIAGNOSIS — K219 Gastro-esophageal reflux disease without esophagitis: Secondary | ICD-10-CM | POA: Diagnosis not present

## 2019-01-24 DIAGNOSIS — S638X2D Sprain of other part of left wrist and hand, subsequent encounter: Secondary | ICD-10-CM | POA: Diagnosis not present

## 2019-01-24 DIAGNOSIS — M455 Ankylosing spondylitis of thoracolumbar region: Secondary | ICD-10-CM | POA: Diagnosis not present

## 2019-01-24 DIAGNOSIS — M6281 Muscle weakness (generalized): Secondary | ICD-10-CM | POA: Diagnosis not present

## 2019-01-24 DIAGNOSIS — I10 Essential (primary) hypertension: Secondary | ICD-10-CM | POA: Diagnosis not present

## 2019-01-26 DIAGNOSIS — Z431 Encounter for attention to gastrostomy: Secondary | ICD-10-CM | POA: Diagnosis not present

## 2019-01-26 DIAGNOSIS — K219 Gastro-esophageal reflux disease without esophagitis: Secondary | ICD-10-CM | POA: Diagnosis not present

## 2019-01-26 DIAGNOSIS — R2689 Other abnormalities of gait and mobility: Secondary | ICD-10-CM | POA: Diagnosis not present

## 2019-01-26 DIAGNOSIS — M455 Ankylosing spondylitis of thoracolumbar region: Secondary | ICD-10-CM | POA: Diagnosis not present

## 2019-01-26 DIAGNOSIS — R278 Other lack of coordination: Secondary | ICD-10-CM | POA: Diagnosis not present

## 2019-01-26 DIAGNOSIS — M6281 Muscle weakness (generalized): Secondary | ICD-10-CM | POA: Diagnosis not present

## 2019-01-27 DIAGNOSIS — M6281 Muscle weakness (generalized): Secondary | ICD-10-CM | POA: Diagnosis not present

## 2019-01-27 DIAGNOSIS — M455 Ankylosing spondylitis of thoracolumbar region: Secondary | ICD-10-CM | POA: Diagnosis not present

## 2019-01-27 DIAGNOSIS — K219 Gastro-esophageal reflux disease without esophagitis: Secondary | ICD-10-CM | POA: Diagnosis not present

## 2019-01-27 DIAGNOSIS — R278 Other lack of coordination: Secondary | ICD-10-CM | POA: Diagnosis not present

## 2019-01-27 DIAGNOSIS — Z431 Encounter for attention to gastrostomy: Secondary | ICD-10-CM | POA: Diagnosis not present

## 2019-01-27 DIAGNOSIS — R2689 Other abnormalities of gait and mobility: Secondary | ICD-10-CM | POA: Diagnosis not present

## 2019-01-28 DIAGNOSIS — Z431 Encounter for attention to gastrostomy: Secondary | ICD-10-CM | POA: Diagnosis not present

## 2019-01-28 DIAGNOSIS — K219 Gastro-esophageal reflux disease without esophagitis: Secondary | ICD-10-CM | POA: Diagnosis not present

## 2019-01-28 DIAGNOSIS — R2689 Other abnormalities of gait and mobility: Secondary | ICD-10-CM | POA: Diagnosis not present

## 2019-01-28 DIAGNOSIS — M6281 Muscle weakness (generalized): Secondary | ICD-10-CM | POA: Diagnosis not present

## 2019-01-28 DIAGNOSIS — R278 Other lack of coordination: Secondary | ICD-10-CM | POA: Diagnosis not present

## 2019-01-28 DIAGNOSIS — M455 Ankylosing spondylitis of thoracolumbar region: Secondary | ICD-10-CM | POA: Diagnosis not present

## 2019-01-30 DIAGNOSIS — I10 Essential (primary) hypertension: Secondary | ICD-10-CM | POA: Diagnosis not present

## 2019-01-30 DIAGNOSIS — K219 Gastro-esophageal reflux disease without esophagitis: Secondary | ICD-10-CM | POA: Diagnosis not present

## 2019-01-30 DIAGNOSIS — M6281 Muscle weakness (generalized): Secondary | ICD-10-CM | POA: Diagnosis not present

## 2019-01-30 DIAGNOSIS — Z431 Encounter for attention to gastrostomy: Secondary | ICD-10-CM | POA: Diagnosis not present

## 2019-01-30 DIAGNOSIS — S638X2D Sprain of other part of left wrist and hand, subsequent encounter: Secondary | ICD-10-CM | POA: Diagnosis not present

## 2019-01-30 DIAGNOSIS — R278 Other lack of coordination: Secondary | ICD-10-CM | POA: Diagnosis not present

## 2019-01-30 DIAGNOSIS — R2689 Other abnormalities of gait and mobility: Secondary | ICD-10-CM | POA: Diagnosis not present

## 2019-01-30 DIAGNOSIS — M455 Ankylosing spondylitis of thoracolumbar region: Secondary | ICD-10-CM | POA: Diagnosis not present

## 2019-01-31 DIAGNOSIS — K219 Gastro-esophageal reflux disease without esophagitis: Secondary | ICD-10-CM | POA: Diagnosis not present

## 2019-01-31 DIAGNOSIS — R2689 Other abnormalities of gait and mobility: Secondary | ICD-10-CM | POA: Diagnosis not present

## 2019-01-31 DIAGNOSIS — Z431 Encounter for attention to gastrostomy: Secondary | ICD-10-CM | POA: Diagnosis not present

## 2019-01-31 DIAGNOSIS — M6281 Muscle weakness (generalized): Secondary | ICD-10-CM | POA: Diagnosis not present

## 2019-01-31 DIAGNOSIS — M455 Ankylosing spondylitis of thoracolumbar region: Secondary | ICD-10-CM | POA: Diagnosis not present

## 2019-01-31 DIAGNOSIS — R278 Other lack of coordination: Secondary | ICD-10-CM | POA: Diagnosis not present

## 2019-02-01 DIAGNOSIS — K219 Gastro-esophageal reflux disease without esophagitis: Secondary | ICD-10-CM | POA: Diagnosis not present

## 2019-02-01 DIAGNOSIS — Z431 Encounter for attention to gastrostomy: Secondary | ICD-10-CM | POA: Diagnosis not present

## 2019-02-01 DIAGNOSIS — M455 Ankylosing spondylitis of thoracolumbar region: Secondary | ICD-10-CM | POA: Diagnosis not present

## 2019-02-01 DIAGNOSIS — R278 Other lack of coordination: Secondary | ICD-10-CM | POA: Diagnosis not present

## 2019-02-01 DIAGNOSIS — R2689 Other abnormalities of gait and mobility: Secondary | ICD-10-CM | POA: Diagnosis not present

## 2019-02-01 DIAGNOSIS — M6281 Muscle weakness (generalized): Secondary | ICD-10-CM | POA: Diagnosis not present

## 2019-02-03 DIAGNOSIS — M455 Ankylosing spondylitis of thoracolumbar region: Secondary | ICD-10-CM | POA: Diagnosis not present

## 2019-02-03 DIAGNOSIS — K219 Gastro-esophageal reflux disease without esophagitis: Secondary | ICD-10-CM | POA: Diagnosis not present

## 2019-02-03 DIAGNOSIS — Z431 Encounter for attention to gastrostomy: Secondary | ICD-10-CM | POA: Diagnosis not present

## 2019-02-03 DIAGNOSIS — R278 Other lack of coordination: Secondary | ICD-10-CM | POA: Diagnosis not present

## 2019-02-03 DIAGNOSIS — R2689 Other abnormalities of gait and mobility: Secondary | ICD-10-CM | POA: Diagnosis not present

## 2019-02-03 DIAGNOSIS — M6281 Muscle weakness (generalized): Secondary | ICD-10-CM | POA: Diagnosis not present

## 2019-02-04 DIAGNOSIS — M6281 Muscle weakness (generalized): Secondary | ICD-10-CM | POA: Diagnosis not present

## 2019-02-04 DIAGNOSIS — R278 Other lack of coordination: Secondary | ICD-10-CM | POA: Diagnosis not present

## 2019-02-04 DIAGNOSIS — Z431 Encounter for attention to gastrostomy: Secondary | ICD-10-CM | POA: Diagnosis not present

## 2019-02-04 DIAGNOSIS — K219 Gastro-esophageal reflux disease without esophagitis: Secondary | ICD-10-CM | POA: Diagnosis not present

## 2019-02-04 DIAGNOSIS — M455 Ankylosing spondylitis of thoracolumbar region: Secondary | ICD-10-CM | POA: Diagnosis not present

## 2019-02-04 DIAGNOSIS — R2689 Other abnormalities of gait and mobility: Secondary | ICD-10-CM | POA: Diagnosis not present

## 2019-02-06 DIAGNOSIS — R2689 Other abnormalities of gait and mobility: Secondary | ICD-10-CM | POA: Diagnosis not present

## 2019-02-06 DIAGNOSIS — K219 Gastro-esophageal reflux disease without esophagitis: Secondary | ICD-10-CM | POA: Diagnosis not present

## 2019-02-06 DIAGNOSIS — M6281 Muscle weakness (generalized): Secondary | ICD-10-CM | POA: Diagnosis not present

## 2019-02-06 DIAGNOSIS — Z431 Encounter for attention to gastrostomy: Secondary | ICD-10-CM | POA: Diagnosis not present

## 2019-02-06 DIAGNOSIS — R278 Other lack of coordination: Secondary | ICD-10-CM | POA: Diagnosis not present

## 2019-02-06 DIAGNOSIS — M455 Ankylosing spondylitis of thoracolumbar region: Secondary | ICD-10-CM | POA: Diagnosis not present

## 2019-02-07 DIAGNOSIS — R278 Other lack of coordination: Secondary | ICD-10-CM | POA: Diagnosis not present

## 2019-02-07 DIAGNOSIS — Z431 Encounter for attention to gastrostomy: Secondary | ICD-10-CM | POA: Diagnosis not present

## 2019-02-07 DIAGNOSIS — R2689 Other abnormalities of gait and mobility: Secondary | ICD-10-CM | POA: Diagnosis not present

## 2019-02-07 DIAGNOSIS — M455 Ankylosing spondylitis of thoracolumbar region: Secondary | ICD-10-CM | POA: Diagnosis not present

## 2019-02-07 DIAGNOSIS — M6281 Muscle weakness (generalized): Secondary | ICD-10-CM | POA: Diagnosis not present

## 2019-02-07 DIAGNOSIS — K219 Gastro-esophageal reflux disease without esophagitis: Secondary | ICD-10-CM | POA: Diagnosis not present

## 2019-02-09 DIAGNOSIS — R2689 Other abnormalities of gait and mobility: Secondary | ICD-10-CM | POA: Diagnosis not present

## 2019-02-09 DIAGNOSIS — R278 Other lack of coordination: Secondary | ICD-10-CM | POA: Diagnosis not present

## 2019-02-09 DIAGNOSIS — M455 Ankylosing spondylitis of thoracolumbar region: Secondary | ICD-10-CM | POA: Diagnosis not present

## 2019-02-09 DIAGNOSIS — M6281 Muscle weakness (generalized): Secondary | ICD-10-CM | POA: Diagnosis not present

## 2019-02-09 DIAGNOSIS — Z431 Encounter for attention to gastrostomy: Secondary | ICD-10-CM | POA: Diagnosis not present

## 2019-02-09 DIAGNOSIS — K219 Gastro-esophageal reflux disease without esophagitis: Secondary | ICD-10-CM | POA: Diagnosis not present

## 2019-02-10 DIAGNOSIS — K219 Gastro-esophageal reflux disease without esophagitis: Secondary | ICD-10-CM | POA: Diagnosis not present

## 2019-02-10 DIAGNOSIS — M455 Ankylosing spondylitis of thoracolumbar region: Secondary | ICD-10-CM | POA: Diagnosis not present

## 2019-02-10 DIAGNOSIS — Z431 Encounter for attention to gastrostomy: Secondary | ICD-10-CM | POA: Diagnosis not present

## 2019-02-10 DIAGNOSIS — R278 Other lack of coordination: Secondary | ICD-10-CM | POA: Diagnosis not present

## 2019-02-10 DIAGNOSIS — R2689 Other abnormalities of gait and mobility: Secondary | ICD-10-CM | POA: Diagnosis not present

## 2019-02-10 DIAGNOSIS — M545 Low back pain: Secondary | ICD-10-CM | POA: Diagnosis not present

## 2019-02-10 DIAGNOSIS — I10 Essential (primary) hypertension: Secondary | ICD-10-CM | POA: Diagnosis not present

## 2019-02-10 DIAGNOSIS — M6281 Muscle weakness (generalized): Secondary | ICD-10-CM | POA: Diagnosis not present

## 2019-02-10 DIAGNOSIS — M546 Pain in thoracic spine: Secondary | ICD-10-CM | POA: Diagnosis not present

## 2019-02-10 DIAGNOSIS — N3020 Other chronic cystitis without hematuria: Secondary | ICD-10-CM | POA: Diagnosis not present

## 2019-02-11 DIAGNOSIS — Z431 Encounter for attention to gastrostomy: Secondary | ICD-10-CM | POA: Diagnosis not present

## 2019-02-11 DIAGNOSIS — M6281 Muscle weakness (generalized): Secondary | ICD-10-CM | POA: Diagnosis not present

## 2019-02-11 DIAGNOSIS — R2689 Other abnormalities of gait and mobility: Secondary | ICD-10-CM | POA: Diagnosis not present

## 2019-02-11 DIAGNOSIS — R278 Other lack of coordination: Secondary | ICD-10-CM | POA: Diagnosis not present

## 2019-02-11 DIAGNOSIS — M455 Ankylosing spondylitis of thoracolumbar region: Secondary | ICD-10-CM | POA: Diagnosis not present

## 2019-02-11 DIAGNOSIS — K219 Gastro-esophageal reflux disease without esophagitis: Secondary | ICD-10-CM | POA: Diagnosis not present

## 2019-02-13 DIAGNOSIS — K219 Gastro-esophageal reflux disease without esophagitis: Secondary | ICD-10-CM | POA: Diagnosis not present

## 2019-02-13 DIAGNOSIS — R2689 Other abnormalities of gait and mobility: Secondary | ICD-10-CM | POA: Diagnosis not present

## 2019-02-13 DIAGNOSIS — Z431 Encounter for attention to gastrostomy: Secondary | ICD-10-CM | POA: Diagnosis not present

## 2019-02-13 DIAGNOSIS — R278 Other lack of coordination: Secondary | ICD-10-CM | POA: Diagnosis not present

## 2019-02-13 DIAGNOSIS — M455 Ankylosing spondylitis of thoracolumbar region: Secondary | ICD-10-CM | POA: Diagnosis not present

## 2019-02-13 DIAGNOSIS — M6281 Muscle weakness (generalized): Secondary | ICD-10-CM | POA: Diagnosis not present

## 2019-02-14 DIAGNOSIS — M6281 Muscle weakness (generalized): Secondary | ICD-10-CM | POA: Diagnosis not present

## 2019-02-14 DIAGNOSIS — R278 Other lack of coordination: Secondary | ICD-10-CM | POA: Diagnosis not present

## 2019-02-14 DIAGNOSIS — M455 Ankylosing spondylitis of thoracolumbar region: Secondary | ICD-10-CM | POA: Diagnosis not present

## 2019-02-14 DIAGNOSIS — Z431 Encounter for attention to gastrostomy: Secondary | ICD-10-CM | POA: Diagnosis not present

## 2019-02-14 DIAGNOSIS — K219 Gastro-esophageal reflux disease without esophagitis: Secondary | ICD-10-CM | POA: Diagnosis not present

## 2019-02-14 DIAGNOSIS — R2689 Other abnormalities of gait and mobility: Secondary | ICD-10-CM | POA: Diagnosis not present

## 2019-02-15 DIAGNOSIS — Z431 Encounter for attention to gastrostomy: Secondary | ICD-10-CM | POA: Diagnosis not present

## 2019-02-15 DIAGNOSIS — M455 Ankylosing spondylitis of thoracolumbar region: Secondary | ICD-10-CM | POA: Diagnosis not present

## 2019-02-15 DIAGNOSIS — K219 Gastro-esophageal reflux disease without esophagitis: Secondary | ICD-10-CM | POA: Diagnosis not present

## 2019-02-15 DIAGNOSIS — R278 Other lack of coordination: Secondary | ICD-10-CM | POA: Diagnosis not present

## 2019-02-15 DIAGNOSIS — M6281 Muscle weakness (generalized): Secondary | ICD-10-CM | POA: Diagnosis not present

## 2019-02-15 DIAGNOSIS — R2689 Other abnormalities of gait and mobility: Secondary | ICD-10-CM | POA: Diagnosis not present

## 2019-02-16 DIAGNOSIS — Z431 Encounter for attention to gastrostomy: Secondary | ICD-10-CM | POA: Diagnosis not present

## 2019-02-16 DIAGNOSIS — M455 Ankylosing spondylitis of thoracolumbar region: Secondary | ICD-10-CM | POA: Diagnosis not present

## 2019-02-16 DIAGNOSIS — K219 Gastro-esophageal reflux disease without esophagitis: Secondary | ICD-10-CM | POA: Diagnosis not present

## 2019-02-16 DIAGNOSIS — M6281 Muscle weakness (generalized): Secondary | ICD-10-CM | POA: Diagnosis not present

## 2019-02-16 DIAGNOSIS — R278 Other lack of coordination: Secondary | ICD-10-CM | POA: Diagnosis not present

## 2019-02-16 DIAGNOSIS — R2689 Other abnormalities of gait and mobility: Secondary | ICD-10-CM | POA: Diagnosis not present

## 2019-02-17 DIAGNOSIS — R2689 Other abnormalities of gait and mobility: Secondary | ICD-10-CM | POA: Diagnosis not present

## 2019-02-17 DIAGNOSIS — M6281 Muscle weakness (generalized): Secondary | ICD-10-CM | POA: Diagnosis not present

## 2019-02-17 DIAGNOSIS — K219 Gastro-esophageal reflux disease without esophagitis: Secondary | ICD-10-CM | POA: Diagnosis not present

## 2019-02-17 DIAGNOSIS — R278 Other lack of coordination: Secondary | ICD-10-CM | POA: Diagnosis not present

## 2019-02-17 DIAGNOSIS — Z431 Encounter for attention to gastrostomy: Secondary | ICD-10-CM | POA: Diagnosis not present

## 2019-02-17 DIAGNOSIS — M455 Ankylosing spondylitis of thoracolumbar region: Secondary | ICD-10-CM | POA: Diagnosis not present

## 2019-02-21 DIAGNOSIS — I10 Essential (primary) hypertension: Secondary | ICD-10-CM | POA: Diagnosis not present

## 2019-02-21 DIAGNOSIS — S638X2D Sprain of other part of left wrist and hand, subsequent encounter: Secondary | ICD-10-CM | POA: Diagnosis not present

## 2019-02-21 DIAGNOSIS — R278 Other lack of coordination: Secondary | ICD-10-CM | POA: Diagnosis not present

## 2019-02-21 DIAGNOSIS — M455 Ankylosing spondylitis of thoracolumbar region: Secondary | ICD-10-CM | POA: Diagnosis not present

## 2019-02-21 DIAGNOSIS — Z431 Encounter for attention to gastrostomy: Secondary | ICD-10-CM | POA: Diagnosis not present

## 2019-02-21 DIAGNOSIS — M25512 Pain in left shoulder: Secondary | ICD-10-CM | POA: Diagnosis not present

## 2019-02-21 DIAGNOSIS — R05 Cough: Secondary | ICD-10-CM | POA: Diagnosis not present

## 2019-02-21 DIAGNOSIS — I5032 Chronic diastolic (congestive) heart failure: Secondary | ICD-10-CM | POA: Diagnosis not present

## 2019-02-21 DIAGNOSIS — B372 Candidiasis of skin and nail: Secondary | ICD-10-CM | POA: Diagnosis not present

## 2019-02-21 DIAGNOSIS — R1312 Dysphagia, oropharyngeal phase: Secondary | ICD-10-CM | POA: Diagnosis not present

## 2019-02-21 DIAGNOSIS — J189 Pneumonia, unspecified organism: Secondary | ICD-10-CM | POA: Diagnosis not present

## 2019-02-21 DIAGNOSIS — K219 Gastro-esophageal reflux disease without esophagitis: Secondary | ICD-10-CM | POA: Diagnosis not present

## 2019-02-22 DIAGNOSIS — I10 Essential (primary) hypertension: Secondary | ICD-10-CM | POA: Diagnosis not present

## 2019-02-22 DIAGNOSIS — I5032 Chronic diastolic (congestive) heart failure: Secondary | ICD-10-CM | POA: Diagnosis not present

## 2019-02-22 DIAGNOSIS — K219 Gastro-esophageal reflux disease without esophagitis: Secondary | ICD-10-CM | POA: Diagnosis not present

## 2019-02-22 DIAGNOSIS — J189 Pneumonia, unspecified organism: Secondary | ICD-10-CM | POA: Diagnosis not present

## 2019-02-28 DIAGNOSIS — B372 Candidiasis of skin and nail: Secondary | ICD-10-CM | POA: Diagnosis not present

## 2019-02-28 DIAGNOSIS — J189 Pneumonia, unspecified organism: Secondary | ICD-10-CM | POA: Diagnosis not present

## 2019-02-28 DIAGNOSIS — I10 Essential (primary) hypertension: Secondary | ICD-10-CM | POA: Diagnosis not present

## 2019-02-28 DIAGNOSIS — K219 Gastro-esophageal reflux disease without esophagitis: Secondary | ICD-10-CM | POA: Diagnosis not present

## 2019-03-01 DIAGNOSIS — R109 Unspecified abdominal pain: Secondary | ICD-10-CM | POA: Diagnosis not present

## 2019-03-01 DIAGNOSIS — R112 Nausea with vomiting, unspecified: Secondary | ICD-10-CM | POA: Diagnosis not present

## 2019-03-01 DIAGNOSIS — I10 Essential (primary) hypertension: Secondary | ICD-10-CM | POA: Diagnosis not present

## 2019-03-01 DIAGNOSIS — Z66 Do not resuscitate: Secondary | ICD-10-CM | POA: Diagnosis not present

## 2019-03-01 DIAGNOSIS — G4733 Obstructive sleep apnea (adult) (pediatric): Secondary | ICD-10-CM | POA: Diagnosis not present

## 2019-03-02 DIAGNOSIS — R1312 Dysphagia, oropharyngeal phase: Secondary | ICD-10-CM | POA: Diagnosis not present

## 2019-03-02 DIAGNOSIS — Z79899 Other long term (current) drug therapy: Secondary | ICD-10-CM | POA: Diagnosis not present

## 2019-03-02 DIAGNOSIS — R319 Hematuria, unspecified: Secondary | ICD-10-CM | POA: Diagnosis not present

## 2019-03-02 DIAGNOSIS — N390 Urinary tract infection, site not specified: Secondary | ICD-10-CM | POA: Diagnosis not present

## 2019-03-02 DIAGNOSIS — Z431 Encounter for attention to gastrostomy: Secondary | ICD-10-CM | POA: Diagnosis not present

## 2019-03-02 DIAGNOSIS — S638X2D Sprain of other part of left wrist and hand, subsequent encounter: Secondary | ICD-10-CM | POA: Diagnosis not present

## 2019-03-02 DIAGNOSIS — J189 Pneumonia, unspecified organism: Secondary | ICD-10-CM | POA: Diagnosis not present

## 2019-03-02 DIAGNOSIS — R110 Nausea: Secondary | ICD-10-CM | POA: Diagnosis not present

## 2019-03-02 DIAGNOSIS — M455 Ankylosing spondylitis of thoracolumbar region: Secondary | ICD-10-CM | POA: Diagnosis not present

## 2019-03-02 DIAGNOSIS — D649 Anemia, unspecified: Secondary | ICD-10-CM | POA: Diagnosis not present

## 2019-03-02 DIAGNOSIS — R278 Other lack of coordination: Secondary | ICD-10-CM | POA: Diagnosis not present

## 2019-03-02 DIAGNOSIS — I10 Essential (primary) hypertension: Secondary | ICD-10-CM | POA: Diagnosis not present

## 2019-03-02 DIAGNOSIS — K219 Gastro-esophageal reflux disease without esophagitis: Secondary | ICD-10-CM | POA: Diagnosis not present

## 2019-03-03 DIAGNOSIS — J189 Pneumonia, unspecified organism: Secondary | ICD-10-CM | POA: Diagnosis not present

## 2019-03-03 DIAGNOSIS — R1312 Dysphagia, oropharyngeal phase: Secondary | ICD-10-CM | POA: Diagnosis not present

## 2019-03-03 DIAGNOSIS — M455 Ankylosing spondylitis of thoracolumbar region: Secondary | ICD-10-CM | POA: Diagnosis not present

## 2019-03-03 DIAGNOSIS — R110 Nausea: Secondary | ICD-10-CM | POA: Diagnosis not present

## 2019-03-03 DIAGNOSIS — Z431 Encounter for attention to gastrostomy: Secondary | ICD-10-CM | POA: Diagnosis not present

## 2019-03-03 DIAGNOSIS — R8271 Bacteriuria: Secondary | ICD-10-CM | POA: Diagnosis not present

## 2019-03-03 DIAGNOSIS — E871 Hypo-osmolality and hyponatremia: Secondary | ICD-10-CM | POA: Diagnosis not present

## 2019-03-03 DIAGNOSIS — K219 Gastro-esophageal reflux disease without esophagitis: Secondary | ICD-10-CM | POA: Diagnosis not present

## 2019-03-03 DIAGNOSIS — R278 Other lack of coordination: Secondary | ICD-10-CM | POA: Diagnosis not present

## 2019-03-04 DIAGNOSIS — I10 Essential (primary) hypertension: Secondary | ICD-10-CM | POA: Diagnosis not present

## 2019-03-04 DIAGNOSIS — D649 Anemia, unspecified: Secondary | ICD-10-CM | POA: Diagnosis not present

## 2019-03-06 DIAGNOSIS — I10 Essential (primary) hypertension: Secondary | ICD-10-CM | POA: Diagnosis not present

## 2019-03-06 DIAGNOSIS — D649 Anemia, unspecified: Secondary | ICD-10-CM | POA: Diagnosis not present

## 2019-03-06 DIAGNOSIS — M455 Ankylosing spondylitis of thoracolumbar region: Secondary | ICD-10-CM | POA: Diagnosis not present

## 2019-03-06 DIAGNOSIS — Z431 Encounter for attention to gastrostomy: Secondary | ICD-10-CM | POA: Diagnosis not present

## 2019-03-06 DIAGNOSIS — J189 Pneumonia, unspecified organism: Secondary | ICD-10-CM | POA: Diagnosis not present

## 2019-03-06 DIAGNOSIS — R1312 Dysphagia, oropharyngeal phase: Secondary | ICD-10-CM | POA: Diagnosis not present

## 2019-03-06 DIAGNOSIS — N390 Urinary tract infection, site not specified: Secondary | ICD-10-CM | POA: Diagnosis not present

## 2019-03-06 DIAGNOSIS — K219 Gastro-esophageal reflux disease without esophagitis: Secondary | ICD-10-CM | POA: Diagnosis not present

## 2019-03-06 DIAGNOSIS — D390 Neoplasm of uncertain behavior of uterus: Secondary | ICD-10-CM | POA: Diagnosis not present

## 2019-03-06 DIAGNOSIS — R278 Other lack of coordination: Secondary | ICD-10-CM | POA: Diagnosis not present

## 2019-03-07 DIAGNOSIS — Z431 Encounter for attention to gastrostomy: Secondary | ICD-10-CM | POA: Diagnosis not present

## 2019-03-07 DIAGNOSIS — J189 Pneumonia, unspecified organism: Secondary | ICD-10-CM | POA: Diagnosis not present

## 2019-03-07 DIAGNOSIS — M455 Ankylosing spondylitis of thoracolumbar region: Secondary | ICD-10-CM | POA: Diagnosis not present

## 2019-03-07 DIAGNOSIS — K219 Gastro-esophageal reflux disease without esophagitis: Secondary | ICD-10-CM | POA: Diagnosis not present

## 2019-03-07 DIAGNOSIS — R1312 Dysphagia, oropharyngeal phase: Secondary | ICD-10-CM | POA: Diagnosis not present

## 2019-03-07 DIAGNOSIS — R278 Other lack of coordination: Secondary | ICD-10-CM | POA: Diagnosis not present

## 2019-03-08 DIAGNOSIS — J189 Pneumonia, unspecified organism: Secondary | ICD-10-CM | POA: Diagnosis not present

## 2019-03-08 DIAGNOSIS — R1312 Dysphagia, oropharyngeal phase: Secondary | ICD-10-CM | POA: Diagnosis not present

## 2019-03-08 DIAGNOSIS — K219 Gastro-esophageal reflux disease without esophagitis: Secondary | ICD-10-CM | POA: Diagnosis not present

## 2019-03-08 DIAGNOSIS — Z431 Encounter for attention to gastrostomy: Secondary | ICD-10-CM | POA: Diagnosis not present

## 2019-03-08 DIAGNOSIS — M455 Ankylosing spondylitis of thoracolumbar region: Secondary | ICD-10-CM | POA: Diagnosis not present

## 2019-03-08 DIAGNOSIS — R278 Other lack of coordination: Secondary | ICD-10-CM | POA: Diagnosis not present

## 2019-03-09 DIAGNOSIS — N390 Urinary tract infection, site not specified: Secondary | ICD-10-CM | POA: Diagnosis not present

## 2019-03-09 DIAGNOSIS — Z431 Encounter for attention to gastrostomy: Secondary | ICD-10-CM | POA: Diagnosis not present

## 2019-03-09 DIAGNOSIS — N3020 Other chronic cystitis without hematuria: Secondary | ICD-10-CM | POA: Diagnosis not present

## 2019-03-09 DIAGNOSIS — E871 Hypo-osmolality and hyponatremia: Secondary | ICD-10-CM | POA: Diagnosis not present

## 2019-03-09 DIAGNOSIS — J189 Pneumonia, unspecified organism: Secondary | ICD-10-CM | POA: Diagnosis not present

## 2019-03-09 DIAGNOSIS — R1312 Dysphagia, oropharyngeal phase: Secondary | ICD-10-CM | POA: Diagnosis not present

## 2019-03-09 DIAGNOSIS — K219 Gastro-esophageal reflux disease without esophagitis: Secondary | ICD-10-CM | POA: Diagnosis not present

## 2019-03-09 DIAGNOSIS — M455 Ankylosing spondylitis of thoracolumbar region: Secondary | ICD-10-CM | POA: Diagnosis not present

## 2019-03-09 DIAGNOSIS — R278 Other lack of coordination: Secondary | ICD-10-CM | POA: Diagnosis not present

## 2019-03-10 DIAGNOSIS — R1312 Dysphagia, oropharyngeal phase: Secondary | ICD-10-CM | POA: Diagnosis not present

## 2019-03-10 DIAGNOSIS — Z431 Encounter for attention to gastrostomy: Secondary | ICD-10-CM | POA: Diagnosis not present

## 2019-03-10 DIAGNOSIS — J189 Pneumonia, unspecified organism: Secondary | ICD-10-CM | POA: Diagnosis not present

## 2019-03-10 DIAGNOSIS — M455 Ankylosing spondylitis of thoracolumbar region: Secondary | ICD-10-CM | POA: Diagnosis not present

## 2019-03-10 DIAGNOSIS — K219 Gastro-esophageal reflux disease without esophagitis: Secondary | ICD-10-CM | POA: Diagnosis not present

## 2019-03-10 DIAGNOSIS — R278 Other lack of coordination: Secondary | ICD-10-CM | POA: Diagnosis not present

## 2019-03-13 DIAGNOSIS — K219 Gastro-esophageal reflux disease without esophagitis: Secondary | ICD-10-CM | POA: Diagnosis not present

## 2019-03-13 DIAGNOSIS — M455 Ankylosing spondylitis of thoracolumbar region: Secondary | ICD-10-CM | POA: Diagnosis not present

## 2019-03-13 DIAGNOSIS — R1312 Dysphagia, oropharyngeal phase: Secondary | ICD-10-CM | POA: Diagnosis not present

## 2019-03-13 DIAGNOSIS — R278 Other lack of coordination: Secondary | ICD-10-CM | POA: Diagnosis not present

## 2019-03-13 DIAGNOSIS — J189 Pneumonia, unspecified organism: Secondary | ICD-10-CM | POA: Diagnosis not present

## 2019-03-13 DIAGNOSIS — Z431 Encounter for attention to gastrostomy: Secondary | ICD-10-CM | POA: Diagnosis not present

## 2019-03-14 DIAGNOSIS — R1312 Dysphagia, oropharyngeal phase: Secondary | ICD-10-CM | POA: Diagnosis not present

## 2019-03-14 DIAGNOSIS — J189 Pneumonia, unspecified organism: Secondary | ICD-10-CM | POA: Diagnosis not present

## 2019-03-14 DIAGNOSIS — M455 Ankylosing spondylitis of thoracolumbar region: Secondary | ICD-10-CM | POA: Diagnosis not present

## 2019-03-14 DIAGNOSIS — R278 Other lack of coordination: Secondary | ICD-10-CM | POA: Diagnosis not present

## 2019-03-14 DIAGNOSIS — Z431 Encounter for attention to gastrostomy: Secondary | ICD-10-CM | POA: Diagnosis not present

## 2019-03-14 DIAGNOSIS — K219 Gastro-esophageal reflux disease without esophagitis: Secondary | ICD-10-CM | POA: Diagnosis not present

## 2019-03-16 DIAGNOSIS — D649 Anemia, unspecified: Secondary | ICD-10-CM | POA: Diagnosis not present

## 2019-03-16 DIAGNOSIS — I10 Essential (primary) hypertension: Secondary | ICD-10-CM | POA: Diagnosis not present

## 2019-04-03 DIAGNOSIS — N323 Diverticulum of bladder: Secondary | ICD-10-CM | POA: Diagnosis not present

## 2019-04-03 DIAGNOSIS — R21 Rash and other nonspecific skin eruption: Secondary | ICD-10-CM | POA: Diagnosis not present

## 2019-04-03 DIAGNOSIS — R300 Dysuria: Secondary | ICD-10-CM | POA: Diagnosis not present

## 2019-04-03 DIAGNOSIS — N3020 Other chronic cystitis without hematuria: Secondary | ICD-10-CM | POA: Diagnosis not present

## 2019-04-04 DIAGNOSIS — R319 Hematuria, unspecified: Secondary | ICD-10-CM | POA: Diagnosis not present

## 2019-04-04 DIAGNOSIS — N390 Urinary tract infection, site not specified: Secondary | ICD-10-CM | POA: Diagnosis not present

## 2019-04-20 DIAGNOSIS — D649 Anemia, unspecified: Secondary | ICD-10-CM | POA: Diagnosis not present

## 2019-04-20 DIAGNOSIS — E119 Type 2 diabetes mellitus without complications: Secondary | ICD-10-CM | POA: Diagnosis not present

## 2019-04-20 DIAGNOSIS — I10 Essential (primary) hypertension: Secondary | ICD-10-CM | POA: Diagnosis not present

## 2019-04-20 DIAGNOSIS — E785 Hyperlipidemia, unspecified: Secondary | ICD-10-CM | POA: Diagnosis not present

## 2019-05-10 DIAGNOSIS — L219 Seborrheic dermatitis, unspecified: Secondary | ICD-10-CM | POA: Diagnosis not present

## 2019-05-10 DIAGNOSIS — D049 Carcinoma in situ of skin, unspecified: Secondary | ICD-10-CM | POA: Diagnosis not present

## 2019-05-10 DIAGNOSIS — L719 Rosacea, unspecified: Secondary | ICD-10-CM | POA: Diagnosis not present

## 2019-05-12 DIAGNOSIS — M25511 Pain in right shoulder: Secondary | ICD-10-CM | POA: Diagnosis not present

## 2019-05-12 DIAGNOSIS — L718 Other rosacea: Secondary | ICD-10-CM | POA: Diagnosis not present

## 2019-05-12 DIAGNOSIS — I10 Essential (primary) hypertension: Secondary | ICD-10-CM | POA: Diagnosis not present

## 2019-05-12 DIAGNOSIS — H3530 Unspecified macular degeneration: Secondary | ICD-10-CM | POA: Diagnosis not present

## 2019-05-13 DIAGNOSIS — M25511 Pain in right shoulder: Secondary | ICD-10-CM | POA: Diagnosis not present

## 2019-05-23 DIAGNOSIS — M25512 Pain in left shoulder: Secondary | ICD-10-CM | POA: Diagnosis not present

## 2019-05-23 DIAGNOSIS — M810 Age-related osteoporosis without current pathological fracture: Secondary | ICD-10-CM | POA: Diagnosis not present

## 2019-05-23 DIAGNOSIS — M25511 Pain in right shoulder: Secondary | ICD-10-CM | POA: Diagnosis not present

## 2019-05-23 DIAGNOSIS — M545 Low back pain: Secondary | ICD-10-CM | POA: Diagnosis not present

## 2019-05-31 DIAGNOSIS — Z20828 Contact with and (suspected) exposure to other viral communicable diseases: Secondary | ICD-10-CM | POA: Diagnosis not present

## 2019-06-07 DIAGNOSIS — I10 Essential (primary) hypertension: Secondary | ICD-10-CM | POA: Diagnosis not present

## 2019-06-07 DIAGNOSIS — S638X2D Sprain of other part of left wrist and hand, subsequent encounter: Secondary | ICD-10-CM | POA: Diagnosis not present

## 2019-06-07 DIAGNOSIS — I5032 Chronic diastolic (congestive) heart failure: Secondary | ICD-10-CM | POA: Diagnosis not present

## 2019-06-07 DIAGNOSIS — M6281 Muscle weakness (generalized): Secondary | ICD-10-CM | POA: Diagnosis not present

## 2019-06-07 DIAGNOSIS — J189 Pneumonia, unspecified organism: Secondary | ICD-10-CM | POA: Diagnosis not present

## 2019-06-07 DIAGNOSIS — K219 Gastro-esophageal reflux disease without esophagitis: Secondary | ICD-10-CM | POA: Diagnosis not present

## 2019-06-07 DIAGNOSIS — Z20828 Contact with and (suspected) exposure to other viral communicable diseases: Secondary | ICD-10-CM | POA: Diagnosis not present

## 2019-06-07 DIAGNOSIS — M455 Ankylosing spondylitis of thoracolumbar region: Secondary | ICD-10-CM | POA: Diagnosis not present

## 2019-06-07 DIAGNOSIS — R1312 Dysphagia, oropharyngeal phase: Secondary | ICD-10-CM | POA: Diagnosis not present

## 2019-06-08 DIAGNOSIS — R1312 Dysphagia, oropharyngeal phase: Secondary | ICD-10-CM | POA: Diagnosis not present

## 2019-06-08 DIAGNOSIS — J189 Pneumonia, unspecified organism: Secondary | ICD-10-CM | POA: Diagnosis not present

## 2019-06-08 DIAGNOSIS — M6281 Muscle weakness (generalized): Secondary | ICD-10-CM | POA: Diagnosis not present

## 2019-06-08 DIAGNOSIS — M455 Ankylosing spondylitis of thoracolumbar region: Secondary | ICD-10-CM | POA: Diagnosis not present

## 2019-06-08 DIAGNOSIS — K219 Gastro-esophageal reflux disease without esophagitis: Secondary | ICD-10-CM | POA: Diagnosis not present

## 2019-06-08 DIAGNOSIS — I5032 Chronic diastolic (congestive) heart failure: Secondary | ICD-10-CM | POA: Diagnosis not present

## 2019-06-09 DIAGNOSIS — R1312 Dysphagia, oropharyngeal phase: Secondary | ICD-10-CM | POA: Diagnosis not present

## 2019-06-09 DIAGNOSIS — M455 Ankylosing spondylitis of thoracolumbar region: Secondary | ICD-10-CM | POA: Diagnosis not present

## 2019-06-09 DIAGNOSIS — I5032 Chronic diastolic (congestive) heart failure: Secondary | ICD-10-CM | POA: Diagnosis not present

## 2019-06-09 DIAGNOSIS — K219 Gastro-esophageal reflux disease without esophagitis: Secondary | ICD-10-CM | POA: Diagnosis not present

## 2019-06-09 DIAGNOSIS — J189 Pneumonia, unspecified organism: Secondary | ICD-10-CM | POA: Diagnosis not present

## 2019-06-09 DIAGNOSIS — M6281 Muscle weakness (generalized): Secondary | ICD-10-CM | POA: Diagnosis not present

## 2019-06-12 DIAGNOSIS — M6281 Muscle weakness (generalized): Secondary | ICD-10-CM | POA: Diagnosis not present

## 2019-06-12 DIAGNOSIS — K219 Gastro-esophageal reflux disease without esophagitis: Secondary | ICD-10-CM | POA: Diagnosis not present

## 2019-06-12 DIAGNOSIS — M455 Ankylosing spondylitis of thoracolumbar region: Secondary | ICD-10-CM | POA: Diagnosis not present

## 2019-06-12 DIAGNOSIS — R1312 Dysphagia, oropharyngeal phase: Secondary | ICD-10-CM | POA: Diagnosis not present

## 2019-06-12 DIAGNOSIS — I5032 Chronic diastolic (congestive) heart failure: Secondary | ICD-10-CM | POA: Diagnosis not present

## 2019-06-12 DIAGNOSIS — J189 Pneumonia, unspecified organism: Secondary | ICD-10-CM | POA: Diagnosis not present

## 2019-06-13 DIAGNOSIS — R1312 Dysphagia, oropharyngeal phase: Secondary | ICD-10-CM | POA: Diagnosis not present

## 2019-06-13 DIAGNOSIS — K219 Gastro-esophageal reflux disease without esophagitis: Secondary | ICD-10-CM | POA: Diagnosis not present

## 2019-06-13 DIAGNOSIS — J189 Pneumonia, unspecified organism: Secondary | ICD-10-CM | POA: Diagnosis not present

## 2019-06-13 DIAGNOSIS — M6281 Muscle weakness (generalized): Secondary | ICD-10-CM | POA: Diagnosis not present

## 2019-06-13 DIAGNOSIS — M455 Ankylosing spondylitis of thoracolumbar region: Secondary | ICD-10-CM | POA: Diagnosis not present

## 2019-06-13 DIAGNOSIS — I5032 Chronic diastolic (congestive) heart failure: Secondary | ICD-10-CM | POA: Diagnosis not present

## 2019-06-14 DIAGNOSIS — J189 Pneumonia, unspecified organism: Secondary | ICD-10-CM | POA: Diagnosis not present

## 2019-06-14 DIAGNOSIS — M6281 Muscle weakness (generalized): Secondary | ICD-10-CM | POA: Diagnosis not present

## 2019-06-14 DIAGNOSIS — R1312 Dysphagia, oropharyngeal phase: Secondary | ICD-10-CM | POA: Diagnosis not present

## 2019-06-14 DIAGNOSIS — Z20828 Contact with and (suspected) exposure to other viral communicable diseases: Secondary | ICD-10-CM | POA: Diagnosis not present

## 2019-06-14 DIAGNOSIS — M455 Ankylosing spondylitis of thoracolumbar region: Secondary | ICD-10-CM | POA: Diagnosis not present

## 2019-06-14 DIAGNOSIS — K219 Gastro-esophageal reflux disease without esophagitis: Secondary | ICD-10-CM | POA: Diagnosis not present

## 2019-06-14 DIAGNOSIS — I5032 Chronic diastolic (congestive) heart failure: Secondary | ICD-10-CM | POA: Diagnosis not present

## 2019-06-15 DIAGNOSIS — R1312 Dysphagia, oropharyngeal phase: Secondary | ICD-10-CM | POA: Diagnosis not present

## 2019-06-15 DIAGNOSIS — M6281 Muscle weakness (generalized): Secondary | ICD-10-CM | POA: Diagnosis not present

## 2019-06-15 DIAGNOSIS — K068 Other specified disorders of gingiva and edentulous alveolar ridge: Secondary | ICD-10-CM | POA: Diagnosis not present

## 2019-06-15 DIAGNOSIS — N3289 Other specified disorders of bladder: Secondary | ICD-10-CM | POA: Diagnosis not present

## 2019-06-15 DIAGNOSIS — I5032 Chronic diastolic (congestive) heart failure: Secondary | ICD-10-CM | POA: Diagnosis not present

## 2019-06-15 DIAGNOSIS — L719 Rosacea, unspecified: Secondary | ICD-10-CM | POA: Diagnosis not present

## 2019-06-15 DIAGNOSIS — K219 Gastro-esophageal reflux disease without esophagitis: Secondary | ICD-10-CM | POA: Diagnosis not present

## 2019-06-15 DIAGNOSIS — J189 Pneumonia, unspecified organism: Secondary | ICD-10-CM | POA: Diagnosis not present

## 2019-06-15 DIAGNOSIS — M455 Ankylosing spondylitis of thoracolumbar region: Secondary | ICD-10-CM | POA: Diagnosis not present

## 2019-06-16 DIAGNOSIS — M455 Ankylosing spondylitis of thoracolumbar region: Secondary | ICD-10-CM | POA: Diagnosis not present

## 2019-06-16 DIAGNOSIS — I5032 Chronic diastolic (congestive) heart failure: Secondary | ICD-10-CM | POA: Diagnosis not present

## 2019-06-16 DIAGNOSIS — J189 Pneumonia, unspecified organism: Secondary | ICD-10-CM | POA: Diagnosis not present

## 2019-06-16 DIAGNOSIS — K219 Gastro-esophageal reflux disease without esophagitis: Secondary | ICD-10-CM | POA: Diagnosis not present

## 2019-06-16 DIAGNOSIS — R1312 Dysphagia, oropharyngeal phase: Secondary | ICD-10-CM | POA: Diagnosis not present

## 2019-06-16 DIAGNOSIS — M6281 Muscle weakness (generalized): Secondary | ICD-10-CM | POA: Diagnosis not present

## 2019-06-19 DIAGNOSIS — M6281 Muscle weakness (generalized): Secondary | ICD-10-CM | POA: Diagnosis not present

## 2019-06-19 DIAGNOSIS — R1312 Dysphagia, oropharyngeal phase: Secondary | ICD-10-CM | POA: Diagnosis not present

## 2019-06-19 DIAGNOSIS — M455 Ankylosing spondylitis of thoracolumbar region: Secondary | ICD-10-CM | POA: Diagnosis not present

## 2019-06-19 DIAGNOSIS — J189 Pneumonia, unspecified organism: Secondary | ICD-10-CM | POA: Diagnosis not present

## 2019-06-19 DIAGNOSIS — I5032 Chronic diastolic (congestive) heart failure: Secondary | ICD-10-CM | POA: Diagnosis not present

## 2019-06-19 DIAGNOSIS — K219 Gastro-esophageal reflux disease without esophagitis: Secondary | ICD-10-CM | POA: Diagnosis not present

## 2019-06-20 DIAGNOSIS — M6281 Muscle weakness (generalized): Secondary | ICD-10-CM | POA: Diagnosis not present

## 2019-06-20 DIAGNOSIS — I5032 Chronic diastolic (congestive) heart failure: Secondary | ICD-10-CM | POA: Diagnosis not present

## 2019-06-20 DIAGNOSIS — M455 Ankylosing spondylitis of thoracolumbar region: Secondary | ICD-10-CM | POA: Diagnosis not present

## 2019-06-20 DIAGNOSIS — K219 Gastro-esophageal reflux disease without esophagitis: Secondary | ICD-10-CM | POA: Diagnosis not present

## 2019-06-20 DIAGNOSIS — J189 Pneumonia, unspecified organism: Secondary | ICD-10-CM | POA: Diagnosis not present

## 2019-06-20 DIAGNOSIS — R1312 Dysphagia, oropharyngeal phase: Secondary | ICD-10-CM | POA: Diagnosis not present

## 2019-06-21 DIAGNOSIS — R112 Nausea with vomiting, unspecified: Secondary | ICD-10-CM | POA: Diagnosis not present

## 2019-06-21 DIAGNOSIS — Z66 Do not resuscitate: Secondary | ICD-10-CM | POA: Diagnosis not present

## 2019-06-21 DIAGNOSIS — J189 Pneumonia, unspecified organism: Secondary | ICD-10-CM | POA: Diagnosis not present

## 2019-06-21 DIAGNOSIS — R109 Unspecified abdominal pain: Secondary | ICD-10-CM | POA: Diagnosis not present

## 2019-06-21 DIAGNOSIS — I10 Essential (primary) hypertension: Secondary | ICD-10-CM | POA: Diagnosis not present

## 2019-06-21 DIAGNOSIS — I5032 Chronic diastolic (congestive) heart failure: Secondary | ICD-10-CM | POA: Diagnosis not present

## 2019-06-21 DIAGNOSIS — G8929 Other chronic pain: Secondary | ICD-10-CM | POA: Diagnosis not present

## 2019-06-21 DIAGNOSIS — Z20828 Contact with and (suspected) exposure to other viral communicable diseases: Secondary | ICD-10-CM | POA: Diagnosis not present

## 2019-06-21 DIAGNOSIS — G4733 Obstructive sleep apnea (adult) (pediatric): Secondary | ICD-10-CM | POA: Diagnosis not present

## 2019-06-21 DIAGNOSIS — K219 Gastro-esophageal reflux disease without esophagitis: Secondary | ICD-10-CM | POA: Diagnosis not present

## 2019-06-21 DIAGNOSIS — M455 Ankylosing spondylitis of thoracolumbar region: Secondary | ICD-10-CM | POA: Diagnosis not present

## 2019-06-21 DIAGNOSIS — M6281 Muscle weakness (generalized): Secondary | ICD-10-CM | POA: Diagnosis not present

## 2019-06-21 DIAGNOSIS — R1312 Dysphagia, oropharyngeal phase: Secondary | ICD-10-CM | POA: Diagnosis not present

## 2019-06-22 DIAGNOSIS — M455 Ankylosing spondylitis of thoracolumbar region: Secondary | ICD-10-CM | POA: Diagnosis not present

## 2019-06-22 DIAGNOSIS — I5032 Chronic diastolic (congestive) heart failure: Secondary | ICD-10-CM | POA: Diagnosis not present

## 2019-06-22 DIAGNOSIS — R1312 Dysphagia, oropharyngeal phase: Secondary | ICD-10-CM | POA: Diagnosis not present

## 2019-06-22 DIAGNOSIS — K219 Gastro-esophageal reflux disease without esophagitis: Secondary | ICD-10-CM | POA: Diagnosis not present

## 2019-06-22 DIAGNOSIS — J189 Pneumonia, unspecified organism: Secondary | ICD-10-CM | POA: Diagnosis not present

## 2019-06-22 DIAGNOSIS — M6281 Muscle weakness (generalized): Secondary | ICD-10-CM | POA: Diagnosis not present

## 2019-06-23 DIAGNOSIS — J189 Pneumonia, unspecified organism: Secondary | ICD-10-CM | POA: Diagnosis not present

## 2019-06-23 DIAGNOSIS — R1312 Dysphagia, oropharyngeal phase: Secondary | ICD-10-CM | POA: Diagnosis not present

## 2019-06-23 DIAGNOSIS — K219 Gastro-esophageal reflux disease without esophagitis: Secondary | ICD-10-CM | POA: Diagnosis not present

## 2019-06-23 DIAGNOSIS — M455 Ankylosing spondylitis of thoracolumbar region: Secondary | ICD-10-CM | POA: Diagnosis not present

## 2019-06-23 DIAGNOSIS — M6281 Muscle weakness (generalized): Secondary | ICD-10-CM | POA: Diagnosis not present

## 2019-06-23 DIAGNOSIS — I5032 Chronic diastolic (congestive) heart failure: Secondary | ICD-10-CM | POA: Diagnosis not present

## 2019-06-26 DIAGNOSIS — M6281 Muscle weakness (generalized): Secondary | ICD-10-CM | POA: Diagnosis not present

## 2019-06-26 DIAGNOSIS — J189 Pneumonia, unspecified organism: Secondary | ICD-10-CM | POA: Diagnosis not present

## 2019-06-26 DIAGNOSIS — R1312 Dysphagia, oropharyngeal phase: Secondary | ICD-10-CM | POA: Diagnosis not present

## 2019-06-26 DIAGNOSIS — K219 Gastro-esophageal reflux disease without esophagitis: Secondary | ICD-10-CM | POA: Diagnosis not present

## 2019-06-26 DIAGNOSIS — M455 Ankylosing spondylitis of thoracolumbar region: Secondary | ICD-10-CM | POA: Diagnosis not present

## 2019-06-26 DIAGNOSIS — I5032 Chronic diastolic (congestive) heart failure: Secondary | ICD-10-CM | POA: Diagnosis not present

## 2019-06-27 DIAGNOSIS — R1312 Dysphagia, oropharyngeal phase: Secondary | ICD-10-CM | POA: Diagnosis not present

## 2019-06-27 DIAGNOSIS — J189 Pneumonia, unspecified organism: Secondary | ICD-10-CM | POA: Diagnosis not present

## 2019-06-27 DIAGNOSIS — K219 Gastro-esophageal reflux disease without esophagitis: Secondary | ICD-10-CM | POA: Diagnosis not present

## 2019-06-27 DIAGNOSIS — M455 Ankylosing spondylitis of thoracolumbar region: Secondary | ICD-10-CM | POA: Diagnosis not present

## 2019-06-27 DIAGNOSIS — I5032 Chronic diastolic (congestive) heart failure: Secondary | ICD-10-CM | POA: Diagnosis not present

## 2019-06-27 DIAGNOSIS — M6281 Muscle weakness (generalized): Secondary | ICD-10-CM | POA: Diagnosis not present

## 2019-06-28 DIAGNOSIS — J189 Pneumonia, unspecified organism: Secondary | ICD-10-CM | POA: Diagnosis not present

## 2019-06-28 DIAGNOSIS — R1312 Dysphagia, oropharyngeal phase: Secondary | ICD-10-CM | POA: Diagnosis not present

## 2019-06-28 DIAGNOSIS — M6281 Muscle weakness (generalized): Secondary | ICD-10-CM | POA: Diagnosis not present

## 2019-06-28 DIAGNOSIS — I5032 Chronic diastolic (congestive) heart failure: Secondary | ICD-10-CM | POA: Diagnosis not present

## 2019-06-28 DIAGNOSIS — M455 Ankylosing spondylitis of thoracolumbar region: Secondary | ICD-10-CM | POA: Diagnosis not present

## 2019-06-28 DIAGNOSIS — K219 Gastro-esophageal reflux disease without esophagitis: Secondary | ICD-10-CM | POA: Diagnosis not present

## 2019-06-29 DIAGNOSIS — J189 Pneumonia, unspecified organism: Secondary | ICD-10-CM | POA: Diagnosis not present

## 2019-06-29 DIAGNOSIS — M455 Ankylosing spondylitis of thoracolumbar region: Secondary | ICD-10-CM | POA: Diagnosis not present

## 2019-06-29 DIAGNOSIS — I5032 Chronic diastolic (congestive) heart failure: Secondary | ICD-10-CM | POA: Diagnosis not present

## 2019-06-29 DIAGNOSIS — R1312 Dysphagia, oropharyngeal phase: Secondary | ICD-10-CM | POA: Diagnosis not present

## 2019-06-29 DIAGNOSIS — K219 Gastro-esophageal reflux disease without esophagitis: Secondary | ICD-10-CM | POA: Diagnosis not present

## 2019-06-29 DIAGNOSIS — M6281 Muscle weakness (generalized): Secondary | ICD-10-CM | POA: Diagnosis not present

## 2019-06-30 DIAGNOSIS — J189 Pneumonia, unspecified organism: Secondary | ICD-10-CM | POA: Diagnosis not present

## 2019-06-30 DIAGNOSIS — M455 Ankylosing spondylitis of thoracolumbar region: Secondary | ICD-10-CM | POA: Diagnosis not present

## 2019-06-30 DIAGNOSIS — R1312 Dysphagia, oropharyngeal phase: Secondary | ICD-10-CM | POA: Diagnosis not present

## 2019-06-30 DIAGNOSIS — I5032 Chronic diastolic (congestive) heart failure: Secondary | ICD-10-CM | POA: Diagnosis not present

## 2019-06-30 DIAGNOSIS — K219 Gastro-esophageal reflux disease without esophagitis: Secondary | ICD-10-CM | POA: Diagnosis not present

## 2019-06-30 DIAGNOSIS — M6281 Muscle weakness (generalized): Secondary | ICD-10-CM | POA: Diagnosis not present

## 2019-07-02 DIAGNOSIS — R2689 Other abnormalities of gait and mobility: Secondary | ICD-10-CM | POA: Diagnosis not present

## 2019-07-02 DIAGNOSIS — K219 Gastro-esophageal reflux disease without esophagitis: Secondary | ICD-10-CM | POA: Diagnosis not present

## 2019-07-02 DIAGNOSIS — I5032 Chronic diastolic (congestive) heart failure: Secondary | ICD-10-CM | POA: Diagnosis not present

## 2019-07-02 DIAGNOSIS — S638X2D Sprain of other part of left wrist and hand, subsequent encounter: Secondary | ICD-10-CM | POA: Diagnosis not present

## 2019-07-02 DIAGNOSIS — I10 Essential (primary) hypertension: Secondary | ICD-10-CM | POA: Diagnosis not present

## 2019-07-02 DIAGNOSIS — M6289 Other specified disorders of muscle: Secondary | ICD-10-CM | POA: Diagnosis not present

## 2019-07-02 DIAGNOSIS — M455 Ankylosing spondylitis of thoracolumbar region: Secondary | ICD-10-CM | POA: Diagnosis not present

## 2019-07-02 DIAGNOSIS — R1312 Dysphagia, oropharyngeal phase: Secondary | ICD-10-CM | POA: Diagnosis not present

## 2019-07-03 ENCOUNTER — Other Ambulatory Visit: Payer: Self-pay

## 2019-07-03 DIAGNOSIS — M455 Ankylosing spondylitis of thoracolumbar region: Secondary | ICD-10-CM | POA: Diagnosis not present

## 2019-07-03 DIAGNOSIS — K219 Gastro-esophageal reflux disease without esophagitis: Secondary | ICD-10-CM | POA: Diagnosis not present

## 2019-07-03 DIAGNOSIS — M6289 Other specified disorders of muscle: Secondary | ICD-10-CM | POA: Diagnosis not present

## 2019-07-03 DIAGNOSIS — R2689 Other abnormalities of gait and mobility: Secondary | ICD-10-CM | POA: Diagnosis not present

## 2019-07-03 DIAGNOSIS — R1312 Dysphagia, oropharyngeal phase: Secondary | ICD-10-CM | POA: Diagnosis not present

## 2019-07-03 DIAGNOSIS — I5032 Chronic diastolic (congestive) heart failure: Secondary | ICD-10-CM | POA: Diagnosis not present

## 2019-08-09 DIAGNOSIS — I10 Essential (primary) hypertension: Secondary | ICD-10-CM | POA: Diagnosis not present

## 2019-08-09 DIAGNOSIS — E559 Vitamin D deficiency, unspecified: Secondary | ICD-10-CM | POA: Diagnosis not present

## 2019-08-09 DIAGNOSIS — E785 Hyperlipidemia, unspecified: Secondary | ICD-10-CM | POA: Diagnosis not present

## 2019-08-09 DIAGNOSIS — Z79899 Other long term (current) drug therapy: Secondary | ICD-10-CM | POA: Diagnosis not present

## 2019-08-09 DIAGNOSIS — D649 Anemia, unspecified: Secondary | ICD-10-CM | POA: Diagnosis not present

## 2019-08-09 DIAGNOSIS — E561 Deficiency of vitamin K: Secondary | ICD-10-CM | POA: Diagnosis not present

## 2019-08-09 DIAGNOSIS — E119 Type 2 diabetes mellitus without complications: Secondary | ICD-10-CM | POA: Diagnosis not present

## 2019-08-09 DIAGNOSIS — E039 Hypothyroidism, unspecified: Secondary | ICD-10-CM | POA: Diagnosis not present

## 2019-08-16 DIAGNOSIS — E559 Vitamin D deficiency, unspecified: Secondary | ICD-10-CM | POA: Diagnosis not present

## 2019-08-16 DIAGNOSIS — I10 Essential (primary) hypertension: Secondary | ICD-10-CM | POA: Diagnosis not present

## 2019-08-16 DIAGNOSIS — R109 Unspecified abdominal pain: Secondary | ICD-10-CM | POA: Diagnosis not present

## 2019-08-16 DIAGNOSIS — R112 Nausea with vomiting, unspecified: Secondary | ICD-10-CM | POA: Diagnosis not present

## 2019-08-16 DIAGNOSIS — G8929 Other chronic pain: Secondary | ICD-10-CM | POA: Diagnosis not present

## 2019-08-16 DIAGNOSIS — G4733 Obstructive sleep apnea (adult) (pediatric): Secondary | ICD-10-CM | POA: Diagnosis not present

## 2019-08-16 DIAGNOSIS — Z66 Do not resuscitate: Secondary | ICD-10-CM | POA: Diagnosis not present

## 2019-08-17 DIAGNOSIS — N3020 Other chronic cystitis without hematuria: Secondary | ICD-10-CM | POA: Diagnosis not present

## 2019-08-17 DIAGNOSIS — N323 Diverticulum of bladder: Secondary | ICD-10-CM | POA: Diagnosis not present

## 2019-08-17 DIAGNOSIS — K219 Gastro-esophageal reflux disease without esophagitis: Secondary | ICD-10-CM | POA: Diagnosis not present

## 2019-08-17 DIAGNOSIS — B373 Candidiasis of vulva and vagina: Secondary | ICD-10-CM | POA: Diagnosis not present

## 2019-08-31 DIAGNOSIS — Z20828 Contact with and (suspected) exposure to other viral communicable diseases: Secondary | ICD-10-CM | POA: Diagnosis not present

## 2019-09-06 DIAGNOSIS — Z20828 Contact with and (suspected) exposure to other viral communicable diseases: Secondary | ICD-10-CM | POA: Diagnosis not present

## 2019-09-07 DIAGNOSIS — K449 Diaphragmatic hernia without obstruction or gangrene: Secondary | ICD-10-CM | POA: Diagnosis not present

## 2019-09-07 DIAGNOSIS — L292 Pruritus vulvae: Secondary | ICD-10-CM | POA: Diagnosis not present

## 2019-09-07 DIAGNOSIS — G4709 Other insomnia: Secondary | ICD-10-CM | POA: Diagnosis not present

## 2019-09-07 DIAGNOSIS — K219 Gastro-esophageal reflux disease without esophagitis: Secondary | ICD-10-CM | POA: Diagnosis not present

## 2019-09-08 DIAGNOSIS — I10 Essential (primary) hypertension: Secondary | ICD-10-CM | POA: Diagnosis not present

## 2019-09-08 DIAGNOSIS — K219 Gastro-esophageal reflux disease without esophagitis: Secondary | ICD-10-CM | POA: Diagnosis not present

## 2019-09-08 DIAGNOSIS — R1312 Dysphagia, oropharyngeal phase: Secondary | ICD-10-CM | POA: Diagnosis not present

## 2019-09-08 DIAGNOSIS — M455 Ankylosing spondylitis of thoracolumbar region: Secondary | ICD-10-CM | POA: Diagnosis not present

## 2019-09-08 DIAGNOSIS — R2681 Unsteadiness on feet: Secondary | ICD-10-CM | POA: Diagnosis not present

## 2019-09-08 DIAGNOSIS — S638X2D Sprain of other part of left wrist and hand, subsequent encounter: Secondary | ICD-10-CM | POA: Diagnosis not present

## 2019-09-08 DIAGNOSIS — I5032 Chronic diastolic (congestive) heart failure: Secondary | ICD-10-CM | POA: Diagnosis not present

## 2019-09-08 DIAGNOSIS — J189 Pneumonia, unspecified organism: Secondary | ICD-10-CM | POA: Diagnosis not present

## 2019-09-09 DIAGNOSIS — M455 Ankylosing spondylitis of thoracolumbar region: Secondary | ICD-10-CM | POA: Diagnosis not present

## 2019-09-09 DIAGNOSIS — J189 Pneumonia, unspecified organism: Secondary | ICD-10-CM | POA: Diagnosis not present

## 2019-09-09 DIAGNOSIS — R1312 Dysphagia, oropharyngeal phase: Secondary | ICD-10-CM | POA: Diagnosis not present

## 2019-09-09 DIAGNOSIS — R2681 Unsteadiness on feet: Secondary | ICD-10-CM | POA: Diagnosis not present

## 2019-09-09 DIAGNOSIS — K219 Gastro-esophageal reflux disease without esophagitis: Secondary | ICD-10-CM | POA: Diagnosis not present

## 2019-09-09 DIAGNOSIS — I5032 Chronic diastolic (congestive) heart failure: Secondary | ICD-10-CM | POA: Diagnosis not present

## 2019-09-11 DIAGNOSIS — R2681 Unsteadiness on feet: Secondary | ICD-10-CM | POA: Diagnosis not present

## 2019-09-11 DIAGNOSIS — M455 Ankylosing spondylitis of thoracolumbar region: Secondary | ICD-10-CM | POA: Diagnosis not present

## 2019-09-11 DIAGNOSIS — I5032 Chronic diastolic (congestive) heart failure: Secondary | ICD-10-CM | POA: Diagnosis not present

## 2019-09-11 DIAGNOSIS — J189 Pneumonia, unspecified organism: Secondary | ICD-10-CM | POA: Diagnosis not present

## 2019-09-11 DIAGNOSIS — R1312 Dysphagia, oropharyngeal phase: Secondary | ICD-10-CM | POA: Diagnosis not present

## 2019-09-11 DIAGNOSIS — K219 Gastro-esophageal reflux disease without esophagitis: Secondary | ICD-10-CM | POA: Diagnosis not present

## 2019-09-12 DIAGNOSIS — M455 Ankylosing spondylitis of thoracolumbar region: Secondary | ICD-10-CM | POA: Diagnosis not present

## 2019-09-12 DIAGNOSIS — R1312 Dysphagia, oropharyngeal phase: Secondary | ICD-10-CM | POA: Diagnosis not present

## 2019-09-12 DIAGNOSIS — I5032 Chronic diastolic (congestive) heart failure: Secondary | ICD-10-CM | POA: Diagnosis not present

## 2019-09-12 DIAGNOSIS — R2681 Unsteadiness on feet: Secondary | ICD-10-CM | POA: Diagnosis not present

## 2019-09-12 DIAGNOSIS — J189 Pneumonia, unspecified organism: Secondary | ICD-10-CM | POA: Diagnosis not present

## 2019-09-12 DIAGNOSIS — K219 Gastro-esophageal reflux disease without esophagitis: Secondary | ICD-10-CM | POA: Diagnosis not present

## 2019-09-13 DIAGNOSIS — R1312 Dysphagia, oropharyngeal phase: Secondary | ICD-10-CM | POA: Diagnosis not present

## 2019-09-13 DIAGNOSIS — I5032 Chronic diastolic (congestive) heart failure: Secondary | ICD-10-CM | POA: Diagnosis not present

## 2019-09-13 DIAGNOSIS — K219 Gastro-esophageal reflux disease without esophagitis: Secondary | ICD-10-CM | POA: Diagnosis not present

## 2019-09-13 DIAGNOSIS — R2681 Unsteadiness on feet: Secondary | ICD-10-CM | POA: Diagnosis not present

## 2019-09-13 DIAGNOSIS — M455 Ankylosing spondylitis of thoracolumbar region: Secondary | ICD-10-CM | POA: Diagnosis not present

## 2019-09-13 DIAGNOSIS — J189 Pneumonia, unspecified organism: Secondary | ICD-10-CM | POA: Diagnosis not present

## 2019-09-14 DIAGNOSIS — R1312 Dysphagia, oropharyngeal phase: Secondary | ICD-10-CM | POA: Diagnosis not present

## 2019-09-14 DIAGNOSIS — M455 Ankylosing spondylitis of thoracolumbar region: Secondary | ICD-10-CM | POA: Diagnosis not present

## 2019-09-14 DIAGNOSIS — I5032 Chronic diastolic (congestive) heart failure: Secondary | ICD-10-CM | POA: Diagnosis not present

## 2019-09-14 DIAGNOSIS — J189 Pneumonia, unspecified organism: Secondary | ICD-10-CM | POA: Diagnosis not present

## 2019-09-14 DIAGNOSIS — R2681 Unsteadiness on feet: Secondary | ICD-10-CM | POA: Diagnosis not present

## 2019-09-14 DIAGNOSIS — K219 Gastro-esophageal reflux disease without esophagitis: Secondary | ICD-10-CM | POA: Diagnosis not present

## 2019-09-15 DIAGNOSIS — I5032 Chronic diastolic (congestive) heart failure: Secondary | ICD-10-CM | POA: Diagnosis not present

## 2019-09-15 DIAGNOSIS — R2681 Unsteadiness on feet: Secondary | ICD-10-CM | POA: Diagnosis not present

## 2019-09-15 DIAGNOSIS — J189 Pneumonia, unspecified organism: Secondary | ICD-10-CM | POA: Diagnosis not present

## 2019-09-15 DIAGNOSIS — M455 Ankylosing spondylitis of thoracolumbar region: Secondary | ICD-10-CM | POA: Diagnosis not present

## 2019-09-15 DIAGNOSIS — K219 Gastro-esophageal reflux disease without esophagitis: Secondary | ICD-10-CM | POA: Diagnosis not present

## 2019-09-15 DIAGNOSIS — R1312 Dysphagia, oropharyngeal phase: Secondary | ICD-10-CM | POA: Diagnosis not present

## 2019-09-18 DIAGNOSIS — R1312 Dysphagia, oropharyngeal phase: Secondary | ICD-10-CM | POA: Diagnosis not present

## 2019-09-18 DIAGNOSIS — J189 Pneumonia, unspecified organism: Secondary | ICD-10-CM | POA: Diagnosis not present

## 2019-09-18 DIAGNOSIS — B351 Tinea unguium: Secondary | ICD-10-CM | POA: Diagnosis not present

## 2019-09-18 DIAGNOSIS — K219 Gastro-esophageal reflux disease without esophagitis: Secondary | ICD-10-CM | POA: Diagnosis not present

## 2019-09-18 DIAGNOSIS — I5032 Chronic diastolic (congestive) heart failure: Secondary | ICD-10-CM | POA: Diagnosis not present

## 2019-09-18 DIAGNOSIS — M455 Ankylosing spondylitis of thoracolumbar region: Secondary | ICD-10-CM | POA: Diagnosis not present

## 2019-09-18 DIAGNOSIS — I739 Peripheral vascular disease, unspecified: Secondary | ICD-10-CM | POA: Diagnosis not present

## 2019-09-18 DIAGNOSIS — R2681 Unsteadiness on feet: Secondary | ICD-10-CM | POA: Diagnosis not present

## 2019-09-18 DIAGNOSIS — Q845 Enlarged and hypertrophic nails: Secondary | ICD-10-CM | POA: Diagnosis not present

## 2019-09-19 DIAGNOSIS — R2681 Unsteadiness on feet: Secondary | ICD-10-CM | POA: Diagnosis not present

## 2019-09-19 DIAGNOSIS — Z20828 Contact with and (suspected) exposure to other viral communicable diseases: Secondary | ICD-10-CM | POA: Diagnosis not present

## 2019-09-19 DIAGNOSIS — M455 Ankylosing spondylitis of thoracolumbar region: Secondary | ICD-10-CM | POA: Diagnosis not present

## 2019-09-19 DIAGNOSIS — K219 Gastro-esophageal reflux disease without esophagitis: Secondary | ICD-10-CM | POA: Diagnosis not present

## 2019-09-19 DIAGNOSIS — J189 Pneumonia, unspecified organism: Secondary | ICD-10-CM | POA: Diagnosis not present

## 2019-09-19 DIAGNOSIS — R1312 Dysphagia, oropharyngeal phase: Secondary | ICD-10-CM | POA: Diagnosis not present

## 2019-09-19 DIAGNOSIS — I5032 Chronic diastolic (congestive) heart failure: Secondary | ICD-10-CM | POA: Diagnosis not present

## 2019-09-20 DIAGNOSIS — K219 Gastro-esophageal reflux disease without esophagitis: Secondary | ICD-10-CM | POA: Diagnosis not present

## 2019-09-20 DIAGNOSIS — R1312 Dysphagia, oropharyngeal phase: Secondary | ICD-10-CM | POA: Diagnosis not present

## 2019-09-20 DIAGNOSIS — J189 Pneumonia, unspecified organism: Secondary | ICD-10-CM | POA: Diagnosis not present

## 2019-09-20 DIAGNOSIS — I5032 Chronic diastolic (congestive) heart failure: Secondary | ICD-10-CM | POA: Diagnosis not present

## 2019-09-20 DIAGNOSIS — M455 Ankylosing spondylitis of thoracolumbar region: Secondary | ICD-10-CM | POA: Diagnosis not present

## 2019-09-20 DIAGNOSIS — R2681 Unsteadiness on feet: Secondary | ICD-10-CM | POA: Diagnosis not present

## 2019-09-21 DIAGNOSIS — M455 Ankylosing spondylitis of thoracolumbar region: Secondary | ICD-10-CM | POA: Diagnosis not present

## 2019-09-21 DIAGNOSIS — R2681 Unsteadiness on feet: Secondary | ICD-10-CM | POA: Diagnosis not present

## 2019-09-21 DIAGNOSIS — R1312 Dysphagia, oropharyngeal phase: Secondary | ICD-10-CM | POA: Diagnosis not present

## 2019-09-21 DIAGNOSIS — I5032 Chronic diastolic (congestive) heart failure: Secondary | ICD-10-CM | POA: Diagnosis not present

## 2019-09-21 DIAGNOSIS — J189 Pneumonia, unspecified organism: Secondary | ICD-10-CM | POA: Diagnosis not present

## 2019-09-21 DIAGNOSIS — K219 Gastro-esophageal reflux disease without esophagitis: Secondary | ICD-10-CM | POA: Diagnosis not present

## 2019-09-24 DIAGNOSIS — J189 Pneumonia, unspecified organism: Secondary | ICD-10-CM | POA: Diagnosis not present

## 2019-09-24 DIAGNOSIS — K219 Gastro-esophageal reflux disease without esophagitis: Secondary | ICD-10-CM | POA: Diagnosis not present

## 2019-09-24 DIAGNOSIS — I5032 Chronic diastolic (congestive) heart failure: Secondary | ICD-10-CM | POA: Diagnosis not present

## 2019-09-24 DIAGNOSIS — R2681 Unsteadiness on feet: Secondary | ICD-10-CM | POA: Diagnosis not present

## 2019-09-24 DIAGNOSIS — R1312 Dysphagia, oropharyngeal phase: Secondary | ICD-10-CM | POA: Diagnosis not present

## 2019-09-24 DIAGNOSIS — M455 Ankylosing spondylitis of thoracolumbar region: Secondary | ICD-10-CM | POA: Diagnosis not present

## 2019-09-25 DIAGNOSIS — R2681 Unsteadiness on feet: Secondary | ICD-10-CM | POA: Diagnosis not present

## 2019-09-25 DIAGNOSIS — M455 Ankylosing spondylitis of thoracolumbar region: Secondary | ICD-10-CM | POA: Diagnosis not present

## 2019-09-25 DIAGNOSIS — R1312 Dysphagia, oropharyngeal phase: Secondary | ICD-10-CM | POA: Diagnosis not present

## 2019-09-25 DIAGNOSIS — J189 Pneumonia, unspecified organism: Secondary | ICD-10-CM | POA: Diagnosis not present

## 2019-09-25 DIAGNOSIS — K219 Gastro-esophageal reflux disease without esophagitis: Secondary | ICD-10-CM | POA: Diagnosis not present

## 2019-09-25 DIAGNOSIS — I5032 Chronic diastolic (congestive) heart failure: Secondary | ICD-10-CM | POA: Diagnosis not present

## 2019-09-26 DIAGNOSIS — M455 Ankylosing spondylitis of thoracolumbar region: Secondary | ICD-10-CM | POA: Diagnosis not present

## 2019-09-26 DIAGNOSIS — R1312 Dysphagia, oropharyngeal phase: Secondary | ICD-10-CM | POA: Diagnosis not present

## 2019-09-26 DIAGNOSIS — K219 Gastro-esophageal reflux disease without esophagitis: Secondary | ICD-10-CM | POA: Diagnosis not present

## 2019-09-26 DIAGNOSIS — I5032 Chronic diastolic (congestive) heart failure: Secondary | ICD-10-CM | POA: Diagnosis not present

## 2019-09-26 DIAGNOSIS — Z20828 Contact with and (suspected) exposure to other viral communicable diseases: Secondary | ICD-10-CM | POA: Diagnosis not present

## 2019-09-26 DIAGNOSIS — R2681 Unsteadiness on feet: Secondary | ICD-10-CM | POA: Diagnosis not present

## 2019-09-26 DIAGNOSIS — J189 Pneumonia, unspecified organism: Secondary | ICD-10-CM | POA: Diagnosis not present

## 2019-09-27 DIAGNOSIS — M455 Ankylosing spondylitis of thoracolumbar region: Secondary | ICD-10-CM | POA: Diagnosis not present

## 2019-09-27 DIAGNOSIS — R2681 Unsteadiness on feet: Secondary | ICD-10-CM | POA: Diagnosis not present

## 2019-09-27 DIAGNOSIS — I5032 Chronic diastolic (congestive) heart failure: Secondary | ICD-10-CM | POA: Diagnosis not present

## 2019-09-27 DIAGNOSIS — R1312 Dysphagia, oropharyngeal phase: Secondary | ICD-10-CM | POA: Diagnosis not present

## 2019-09-27 DIAGNOSIS — J189 Pneumonia, unspecified organism: Secondary | ICD-10-CM | POA: Diagnosis not present

## 2019-09-27 DIAGNOSIS — K219 Gastro-esophageal reflux disease without esophagitis: Secondary | ICD-10-CM | POA: Diagnosis not present

## 2019-09-28 DIAGNOSIS — J189 Pneumonia, unspecified organism: Secondary | ICD-10-CM | POA: Diagnosis not present

## 2019-09-28 DIAGNOSIS — K219 Gastro-esophageal reflux disease without esophagitis: Secondary | ICD-10-CM | POA: Diagnosis not present

## 2019-09-28 DIAGNOSIS — R1312 Dysphagia, oropharyngeal phase: Secondary | ICD-10-CM | POA: Diagnosis not present

## 2019-09-28 DIAGNOSIS — R2681 Unsteadiness on feet: Secondary | ICD-10-CM | POA: Diagnosis not present

## 2019-09-28 DIAGNOSIS — M455 Ankylosing spondylitis of thoracolumbar region: Secondary | ICD-10-CM | POA: Diagnosis not present

## 2019-09-28 DIAGNOSIS — I5032 Chronic diastolic (congestive) heart failure: Secondary | ICD-10-CM | POA: Diagnosis not present

## 2019-09-29 DIAGNOSIS — J189 Pneumonia, unspecified organism: Secondary | ICD-10-CM | POA: Diagnosis not present

## 2019-09-29 DIAGNOSIS — R2681 Unsteadiness on feet: Secondary | ICD-10-CM | POA: Diagnosis not present

## 2019-09-29 DIAGNOSIS — I5032 Chronic diastolic (congestive) heart failure: Secondary | ICD-10-CM | POA: Diagnosis not present

## 2019-09-29 DIAGNOSIS — K219 Gastro-esophageal reflux disease without esophagitis: Secondary | ICD-10-CM | POA: Diagnosis not present

## 2019-09-29 DIAGNOSIS — R1312 Dysphagia, oropharyngeal phase: Secondary | ICD-10-CM | POA: Diagnosis not present

## 2019-09-29 DIAGNOSIS — M455 Ankylosing spondylitis of thoracolumbar region: Secondary | ICD-10-CM | POA: Diagnosis not present

## 2019-10-02 DIAGNOSIS — S638X2D Sprain of other part of left wrist and hand, subsequent encounter: Secondary | ICD-10-CM | POA: Diagnosis not present

## 2019-10-02 DIAGNOSIS — M455 Ankylosing spondylitis of thoracolumbar region: Secondary | ICD-10-CM | POA: Diagnosis not present

## 2019-10-02 DIAGNOSIS — I5032 Chronic diastolic (congestive) heart failure: Secondary | ICD-10-CM | POA: Diagnosis not present

## 2019-10-02 DIAGNOSIS — J189 Pneumonia, unspecified organism: Secondary | ICD-10-CM | POA: Diagnosis not present

## 2019-10-02 DIAGNOSIS — R1312 Dysphagia, oropharyngeal phase: Secondary | ICD-10-CM | POA: Diagnosis not present

## 2019-10-02 DIAGNOSIS — K219 Gastro-esophageal reflux disease without esophagitis: Secondary | ICD-10-CM | POA: Diagnosis not present

## 2019-10-02 DIAGNOSIS — I10 Essential (primary) hypertension: Secondary | ICD-10-CM | POA: Diagnosis not present

## 2019-10-03 DIAGNOSIS — I5032 Chronic diastolic (congestive) heart failure: Secondary | ICD-10-CM | POA: Diagnosis not present

## 2019-10-03 DIAGNOSIS — S638X2D Sprain of other part of left wrist and hand, subsequent encounter: Secondary | ICD-10-CM | POA: Diagnosis not present

## 2019-10-03 DIAGNOSIS — Z20828 Contact with and (suspected) exposure to other viral communicable diseases: Secondary | ICD-10-CM | POA: Diagnosis not present

## 2019-10-03 DIAGNOSIS — K219 Gastro-esophageal reflux disease without esophagitis: Secondary | ICD-10-CM | POA: Diagnosis not present

## 2019-10-03 DIAGNOSIS — J189 Pneumonia, unspecified organism: Secondary | ICD-10-CM | POA: Diagnosis not present

## 2019-10-03 DIAGNOSIS — R1312 Dysphagia, oropharyngeal phase: Secondary | ICD-10-CM | POA: Diagnosis not present

## 2019-10-03 DIAGNOSIS — M455 Ankylosing spondylitis of thoracolumbar region: Secondary | ICD-10-CM | POA: Diagnosis not present

## 2019-10-04 DIAGNOSIS — K219 Gastro-esophageal reflux disease without esophagitis: Secondary | ICD-10-CM | POA: Diagnosis not present

## 2019-10-04 DIAGNOSIS — J189 Pneumonia, unspecified organism: Secondary | ICD-10-CM | POA: Diagnosis not present

## 2019-10-04 DIAGNOSIS — S638X2D Sprain of other part of left wrist and hand, subsequent encounter: Secondary | ICD-10-CM | POA: Diagnosis not present

## 2019-10-04 DIAGNOSIS — M455 Ankylosing spondylitis of thoracolumbar region: Secondary | ICD-10-CM | POA: Diagnosis not present

## 2019-10-04 DIAGNOSIS — I5032 Chronic diastolic (congestive) heart failure: Secondary | ICD-10-CM | POA: Diagnosis not present

## 2019-10-04 DIAGNOSIS — R1312 Dysphagia, oropharyngeal phase: Secondary | ICD-10-CM | POA: Diagnosis not present

## 2019-10-05 DIAGNOSIS — R1312 Dysphagia, oropharyngeal phase: Secondary | ICD-10-CM | POA: Diagnosis not present

## 2019-10-05 DIAGNOSIS — K219 Gastro-esophageal reflux disease without esophagitis: Secondary | ICD-10-CM | POA: Diagnosis not present

## 2019-10-05 DIAGNOSIS — I5032 Chronic diastolic (congestive) heart failure: Secondary | ICD-10-CM | POA: Diagnosis not present

## 2019-10-05 DIAGNOSIS — J189 Pneumonia, unspecified organism: Secondary | ICD-10-CM | POA: Diagnosis not present

## 2019-10-05 DIAGNOSIS — S638X2D Sprain of other part of left wrist and hand, subsequent encounter: Secondary | ICD-10-CM | POA: Diagnosis not present

## 2019-10-05 DIAGNOSIS — M455 Ankylosing spondylitis of thoracolumbar region: Secondary | ICD-10-CM | POA: Diagnosis not present

## 2019-10-06 DIAGNOSIS — J189 Pneumonia, unspecified organism: Secondary | ICD-10-CM | POA: Diagnosis not present

## 2019-10-06 DIAGNOSIS — K219 Gastro-esophageal reflux disease without esophagitis: Secondary | ICD-10-CM | POA: Diagnosis not present

## 2019-10-06 DIAGNOSIS — I5032 Chronic diastolic (congestive) heart failure: Secondary | ICD-10-CM | POA: Diagnosis not present

## 2019-10-06 DIAGNOSIS — M455 Ankylosing spondylitis of thoracolumbar region: Secondary | ICD-10-CM | POA: Diagnosis not present

## 2019-10-06 DIAGNOSIS — S638X2D Sprain of other part of left wrist and hand, subsequent encounter: Secondary | ICD-10-CM | POA: Diagnosis not present

## 2019-10-06 DIAGNOSIS — R1312 Dysphagia, oropharyngeal phase: Secondary | ICD-10-CM | POA: Diagnosis not present

## 2019-10-09 DIAGNOSIS — I5032 Chronic diastolic (congestive) heart failure: Secondary | ICD-10-CM | POA: Diagnosis not present

## 2019-10-09 DIAGNOSIS — J189 Pneumonia, unspecified organism: Secondary | ICD-10-CM | POA: Diagnosis not present

## 2019-10-09 DIAGNOSIS — M455 Ankylosing spondylitis of thoracolumbar region: Secondary | ICD-10-CM | POA: Diagnosis not present

## 2019-10-09 DIAGNOSIS — K219 Gastro-esophageal reflux disease without esophagitis: Secondary | ICD-10-CM | POA: Diagnosis not present

## 2019-10-09 DIAGNOSIS — R1312 Dysphagia, oropharyngeal phase: Secondary | ICD-10-CM | POA: Diagnosis not present

## 2019-10-09 DIAGNOSIS — S638X2D Sprain of other part of left wrist and hand, subsequent encounter: Secondary | ICD-10-CM | POA: Diagnosis not present

## 2019-10-10 DIAGNOSIS — K219 Gastro-esophageal reflux disease without esophagitis: Secondary | ICD-10-CM | POA: Diagnosis not present

## 2019-10-10 DIAGNOSIS — M455 Ankylosing spondylitis of thoracolumbar region: Secondary | ICD-10-CM | POA: Diagnosis not present

## 2019-10-10 DIAGNOSIS — J189 Pneumonia, unspecified organism: Secondary | ICD-10-CM | POA: Diagnosis not present

## 2019-10-10 DIAGNOSIS — R1312 Dysphagia, oropharyngeal phase: Secondary | ICD-10-CM | POA: Diagnosis not present

## 2019-10-10 DIAGNOSIS — I5032 Chronic diastolic (congestive) heart failure: Secondary | ICD-10-CM | POA: Diagnosis not present

## 2019-10-10 DIAGNOSIS — Z20828 Contact with and (suspected) exposure to other viral communicable diseases: Secondary | ICD-10-CM | POA: Diagnosis not present

## 2019-10-10 DIAGNOSIS — S638X2D Sprain of other part of left wrist and hand, subsequent encounter: Secondary | ICD-10-CM | POA: Diagnosis not present

## 2019-10-11 DIAGNOSIS — G4733 Obstructive sleep apnea (adult) (pediatric): Secondary | ICD-10-CM | POA: Diagnosis not present

## 2019-10-11 DIAGNOSIS — R109 Unspecified abdominal pain: Secondary | ICD-10-CM | POA: Diagnosis not present

## 2019-10-11 DIAGNOSIS — I5032 Chronic diastolic (congestive) heart failure: Secondary | ICD-10-CM | POA: Diagnosis not present

## 2019-10-11 DIAGNOSIS — R1312 Dysphagia, oropharyngeal phase: Secondary | ICD-10-CM | POA: Diagnosis not present

## 2019-10-11 DIAGNOSIS — S638X2D Sprain of other part of left wrist and hand, subsequent encounter: Secondary | ICD-10-CM | POA: Diagnosis not present

## 2019-10-11 DIAGNOSIS — R112 Nausea with vomiting, unspecified: Secondary | ICD-10-CM | POA: Diagnosis not present

## 2019-10-11 DIAGNOSIS — E559 Vitamin D deficiency, unspecified: Secondary | ICD-10-CM | POA: Diagnosis not present

## 2019-10-11 DIAGNOSIS — M455 Ankylosing spondylitis of thoracolumbar region: Secondary | ICD-10-CM | POA: Diagnosis not present

## 2019-10-11 DIAGNOSIS — J189 Pneumonia, unspecified organism: Secondary | ICD-10-CM | POA: Diagnosis not present

## 2019-10-11 DIAGNOSIS — K219 Gastro-esophageal reflux disease without esophagitis: Secondary | ICD-10-CM | POA: Diagnosis not present

## 2019-10-11 DIAGNOSIS — Z66 Do not resuscitate: Secondary | ICD-10-CM | POA: Diagnosis not present

## 2019-10-11 DIAGNOSIS — I10 Essential (primary) hypertension: Secondary | ICD-10-CM | POA: Diagnosis not present

## 2019-10-11 DIAGNOSIS — G8929 Other chronic pain: Secondary | ICD-10-CM | POA: Diagnosis not present

## 2019-10-12 DIAGNOSIS — J189 Pneumonia, unspecified organism: Secondary | ICD-10-CM | POA: Diagnosis not present

## 2019-10-12 DIAGNOSIS — R1312 Dysphagia, oropharyngeal phase: Secondary | ICD-10-CM | POA: Diagnosis not present

## 2019-10-12 DIAGNOSIS — I5032 Chronic diastolic (congestive) heart failure: Secondary | ICD-10-CM | POA: Diagnosis not present

## 2019-10-12 DIAGNOSIS — M455 Ankylosing spondylitis of thoracolumbar region: Secondary | ICD-10-CM | POA: Diagnosis not present

## 2019-10-12 DIAGNOSIS — S638X2D Sprain of other part of left wrist and hand, subsequent encounter: Secondary | ICD-10-CM | POA: Diagnosis not present

## 2019-10-12 DIAGNOSIS — K219 Gastro-esophageal reflux disease without esophagitis: Secondary | ICD-10-CM | POA: Diagnosis not present

## 2019-10-13 DIAGNOSIS — K219 Gastro-esophageal reflux disease without esophagitis: Secondary | ICD-10-CM | POA: Diagnosis not present

## 2019-10-13 DIAGNOSIS — J189 Pneumonia, unspecified organism: Secondary | ICD-10-CM | POA: Diagnosis not present

## 2019-10-13 DIAGNOSIS — M455 Ankylosing spondylitis of thoracolumbar region: Secondary | ICD-10-CM | POA: Diagnosis not present

## 2019-10-13 DIAGNOSIS — S638X2D Sprain of other part of left wrist and hand, subsequent encounter: Secondary | ICD-10-CM | POA: Diagnosis not present

## 2019-10-13 DIAGNOSIS — I5032 Chronic diastolic (congestive) heart failure: Secondary | ICD-10-CM | POA: Diagnosis not present

## 2019-10-13 DIAGNOSIS — R1312 Dysphagia, oropharyngeal phase: Secondary | ICD-10-CM | POA: Diagnosis not present

## 2019-10-16 DIAGNOSIS — M455 Ankylosing spondylitis of thoracolumbar region: Secondary | ICD-10-CM | POA: Diagnosis not present

## 2019-10-16 DIAGNOSIS — K219 Gastro-esophageal reflux disease without esophagitis: Secondary | ICD-10-CM | POA: Diagnosis not present

## 2019-10-16 DIAGNOSIS — S638X2D Sprain of other part of left wrist and hand, subsequent encounter: Secondary | ICD-10-CM | POA: Diagnosis not present

## 2019-10-16 DIAGNOSIS — J189 Pneumonia, unspecified organism: Secondary | ICD-10-CM | POA: Diagnosis not present

## 2019-10-16 DIAGNOSIS — R1312 Dysphagia, oropharyngeal phase: Secondary | ICD-10-CM | POA: Diagnosis not present

## 2019-10-16 DIAGNOSIS — I5032 Chronic diastolic (congestive) heart failure: Secondary | ICD-10-CM | POA: Diagnosis not present

## 2019-10-17 DIAGNOSIS — J189 Pneumonia, unspecified organism: Secondary | ICD-10-CM | POA: Diagnosis not present

## 2019-10-17 DIAGNOSIS — K219 Gastro-esophageal reflux disease without esophagitis: Secondary | ICD-10-CM | POA: Diagnosis not present

## 2019-10-17 DIAGNOSIS — R1312 Dysphagia, oropharyngeal phase: Secondary | ICD-10-CM | POA: Diagnosis not present

## 2019-10-17 DIAGNOSIS — Z20828 Contact with and (suspected) exposure to other viral communicable diseases: Secondary | ICD-10-CM | POA: Diagnosis not present

## 2019-10-17 DIAGNOSIS — I5032 Chronic diastolic (congestive) heart failure: Secondary | ICD-10-CM | POA: Diagnosis not present

## 2019-10-17 DIAGNOSIS — M455 Ankylosing spondylitis of thoracolumbar region: Secondary | ICD-10-CM | POA: Diagnosis not present

## 2019-10-17 DIAGNOSIS — S638X2D Sprain of other part of left wrist and hand, subsequent encounter: Secondary | ICD-10-CM | POA: Diagnosis not present

## 2019-10-18 DIAGNOSIS — M455 Ankylosing spondylitis of thoracolumbar region: Secondary | ICD-10-CM | POA: Diagnosis not present

## 2019-10-18 DIAGNOSIS — J189 Pneumonia, unspecified organism: Secondary | ICD-10-CM | POA: Diagnosis not present

## 2019-10-18 DIAGNOSIS — R1312 Dysphagia, oropharyngeal phase: Secondary | ICD-10-CM | POA: Diagnosis not present

## 2019-10-18 DIAGNOSIS — K219 Gastro-esophageal reflux disease without esophagitis: Secondary | ICD-10-CM | POA: Diagnosis not present

## 2019-10-18 DIAGNOSIS — I5032 Chronic diastolic (congestive) heart failure: Secondary | ICD-10-CM | POA: Diagnosis not present

## 2019-10-18 DIAGNOSIS — S638X2D Sprain of other part of left wrist and hand, subsequent encounter: Secondary | ICD-10-CM | POA: Diagnosis not present

## 2019-10-19 DIAGNOSIS — K219 Gastro-esophageal reflux disease without esophagitis: Secondary | ICD-10-CM | POA: Diagnosis not present

## 2019-10-19 DIAGNOSIS — J189 Pneumonia, unspecified organism: Secondary | ICD-10-CM | POA: Diagnosis not present

## 2019-10-19 DIAGNOSIS — I5032 Chronic diastolic (congestive) heart failure: Secondary | ICD-10-CM | POA: Diagnosis not present

## 2019-10-19 DIAGNOSIS — S638X2D Sprain of other part of left wrist and hand, subsequent encounter: Secondary | ICD-10-CM | POA: Diagnosis not present

## 2019-10-19 DIAGNOSIS — R1312 Dysphagia, oropharyngeal phase: Secondary | ICD-10-CM | POA: Diagnosis not present

## 2019-10-19 DIAGNOSIS — M455 Ankylosing spondylitis of thoracolumbar region: Secondary | ICD-10-CM | POA: Diagnosis not present

## 2019-10-20 DIAGNOSIS — K219 Gastro-esophageal reflux disease without esophagitis: Secondary | ICD-10-CM | POA: Diagnosis not present

## 2019-10-20 DIAGNOSIS — M455 Ankylosing spondylitis of thoracolumbar region: Secondary | ICD-10-CM | POA: Diagnosis not present

## 2019-10-20 DIAGNOSIS — I5032 Chronic diastolic (congestive) heart failure: Secondary | ICD-10-CM | POA: Diagnosis not present

## 2019-10-20 DIAGNOSIS — S638X2D Sprain of other part of left wrist and hand, subsequent encounter: Secondary | ICD-10-CM | POA: Diagnosis not present

## 2019-10-20 DIAGNOSIS — R1312 Dysphagia, oropharyngeal phase: Secondary | ICD-10-CM | POA: Diagnosis not present

## 2019-10-20 DIAGNOSIS — J189 Pneumonia, unspecified organism: Secondary | ICD-10-CM | POA: Diagnosis not present

## 2019-10-22 DIAGNOSIS — R1312 Dysphagia, oropharyngeal phase: Secondary | ICD-10-CM | POA: Diagnosis not present

## 2019-10-22 DIAGNOSIS — S638X2D Sprain of other part of left wrist and hand, subsequent encounter: Secondary | ICD-10-CM | POA: Diagnosis not present

## 2019-10-22 DIAGNOSIS — M455 Ankylosing spondylitis of thoracolumbar region: Secondary | ICD-10-CM | POA: Diagnosis not present

## 2019-10-22 DIAGNOSIS — K219 Gastro-esophageal reflux disease without esophagitis: Secondary | ICD-10-CM | POA: Diagnosis not present

## 2019-10-22 DIAGNOSIS — J189 Pneumonia, unspecified organism: Secondary | ICD-10-CM | POA: Diagnosis not present

## 2019-10-22 DIAGNOSIS — I5032 Chronic diastolic (congestive) heart failure: Secondary | ICD-10-CM | POA: Diagnosis not present

## 2019-10-23 DIAGNOSIS — S638X2D Sprain of other part of left wrist and hand, subsequent encounter: Secondary | ICD-10-CM | POA: Diagnosis not present

## 2019-10-23 DIAGNOSIS — I5032 Chronic diastolic (congestive) heart failure: Secondary | ICD-10-CM | POA: Diagnosis not present

## 2019-10-23 DIAGNOSIS — M455 Ankylosing spondylitis of thoracolumbar region: Secondary | ICD-10-CM | POA: Diagnosis not present

## 2019-10-23 DIAGNOSIS — K219 Gastro-esophageal reflux disease without esophagitis: Secondary | ICD-10-CM | POA: Diagnosis not present

## 2019-10-23 DIAGNOSIS — R1312 Dysphagia, oropharyngeal phase: Secondary | ICD-10-CM | POA: Diagnosis not present

## 2019-10-23 DIAGNOSIS — J189 Pneumonia, unspecified organism: Secondary | ICD-10-CM | POA: Diagnosis not present

## 2019-10-24 DIAGNOSIS — M455 Ankylosing spondylitis of thoracolumbar region: Secondary | ICD-10-CM | POA: Diagnosis not present

## 2019-10-24 DIAGNOSIS — S638X2D Sprain of other part of left wrist and hand, subsequent encounter: Secondary | ICD-10-CM | POA: Diagnosis not present

## 2019-10-24 DIAGNOSIS — J189 Pneumonia, unspecified organism: Secondary | ICD-10-CM | POA: Diagnosis not present

## 2019-10-24 DIAGNOSIS — R1312 Dysphagia, oropharyngeal phase: Secondary | ICD-10-CM | POA: Diagnosis not present

## 2019-10-24 DIAGNOSIS — Z20828 Contact with and (suspected) exposure to other viral communicable diseases: Secondary | ICD-10-CM | POA: Diagnosis not present

## 2019-10-24 DIAGNOSIS — K219 Gastro-esophageal reflux disease without esophagitis: Secondary | ICD-10-CM | POA: Diagnosis not present

## 2019-10-24 DIAGNOSIS — I5032 Chronic diastolic (congestive) heart failure: Secondary | ICD-10-CM | POA: Diagnosis not present

## 2019-10-25 DIAGNOSIS — K219 Gastro-esophageal reflux disease without esophagitis: Secondary | ICD-10-CM | POA: Diagnosis not present

## 2019-10-25 DIAGNOSIS — R1312 Dysphagia, oropharyngeal phase: Secondary | ICD-10-CM | POA: Diagnosis not present

## 2019-10-25 DIAGNOSIS — M455 Ankylosing spondylitis of thoracolumbar region: Secondary | ICD-10-CM | POA: Diagnosis not present

## 2019-10-25 DIAGNOSIS — J189 Pneumonia, unspecified organism: Secondary | ICD-10-CM | POA: Diagnosis not present

## 2019-10-25 DIAGNOSIS — I5032 Chronic diastolic (congestive) heart failure: Secondary | ICD-10-CM | POA: Diagnosis not present

## 2019-10-25 DIAGNOSIS — S638X2D Sprain of other part of left wrist and hand, subsequent encounter: Secondary | ICD-10-CM | POA: Diagnosis not present

## 2019-10-27 DIAGNOSIS — M455 Ankylosing spondylitis of thoracolumbar region: Secondary | ICD-10-CM | POA: Diagnosis not present

## 2019-10-27 DIAGNOSIS — K219 Gastro-esophageal reflux disease without esophagitis: Secondary | ICD-10-CM | POA: Diagnosis not present

## 2019-10-27 DIAGNOSIS — J189 Pneumonia, unspecified organism: Secondary | ICD-10-CM | POA: Diagnosis not present

## 2019-10-27 DIAGNOSIS — S638X2D Sprain of other part of left wrist and hand, subsequent encounter: Secondary | ICD-10-CM | POA: Diagnosis not present

## 2019-10-27 DIAGNOSIS — R1312 Dysphagia, oropharyngeal phase: Secondary | ICD-10-CM | POA: Diagnosis not present

## 2019-10-27 DIAGNOSIS — I5032 Chronic diastolic (congestive) heart failure: Secondary | ICD-10-CM | POA: Diagnosis not present

## 2019-10-28 DIAGNOSIS — R1312 Dysphagia, oropharyngeal phase: Secondary | ICD-10-CM | POA: Diagnosis not present

## 2019-10-28 DIAGNOSIS — I5032 Chronic diastolic (congestive) heart failure: Secondary | ICD-10-CM | POA: Diagnosis not present

## 2019-10-28 DIAGNOSIS — K219 Gastro-esophageal reflux disease without esophagitis: Secondary | ICD-10-CM | POA: Diagnosis not present

## 2019-10-28 DIAGNOSIS — J189 Pneumonia, unspecified organism: Secondary | ICD-10-CM | POA: Diagnosis not present

## 2019-10-28 DIAGNOSIS — M455 Ankylosing spondylitis of thoracolumbar region: Secondary | ICD-10-CM | POA: Diagnosis not present

## 2019-10-28 DIAGNOSIS — S638X2D Sprain of other part of left wrist and hand, subsequent encounter: Secondary | ICD-10-CM | POA: Diagnosis not present

## 2019-10-29 DIAGNOSIS — J189 Pneumonia, unspecified organism: Secondary | ICD-10-CM | POA: Diagnosis not present

## 2019-10-29 DIAGNOSIS — S638X2D Sprain of other part of left wrist and hand, subsequent encounter: Secondary | ICD-10-CM | POA: Diagnosis not present

## 2019-10-29 DIAGNOSIS — R1312 Dysphagia, oropharyngeal phase: Secondary | ICD-10-CM | POA: Diagnosis not present

## 2019-10-29 DIAGNOSIS — I5032 Chronic diastolic (congestive) heart failure: Secondary | ICD-10-CM | POA: Diagnosis not present

## 2019-10-29 DIAGNOSIS — K219 Gastro-esophageal reflux disease without esophagitis: Secondary | ICD-10-CM | POA: Diagnosis not present

## 2019-10-29 DIAGNOSIS — M455 Ankylosing spondylitis of thoracolumbar region: Secondary | ICD-10-CM | POA: Diagnosis not present

## 2019-10-30 DIAGNOSIS — S638X2D Sprain of other part of left wrist and hand, subsequent encounter: Secondary | ICD-10-CM | POA: Diagnosis not present

## 2019-10-30 DIAGNOSIS — I5032 Chronic diastolic (congestive) heart failure: Secondary | ICD-10-CM | POA: Diagnosis not present

## 2019-10-30 DIAGNOSIS — J189 Pneumonia, unspecified organism: Secondary | ICD-10-CM | POA: Diagnosis not present

## 2019-10-30 DIAGNOSIS — M455 Ankylosing spondylitis of thoracolumbar region: Secondary | ICD-10-CM | POA: Diagnosis not present

## 2019-10-30 DIAGNOSIS — K219 Gastro-esophageal reflux disease without esophagitis: Secondary | ICD-10-CM | POA: Diagnosis not present

## 2019-10-30 DIAGNOSIS — R1312 Dysphagia, oropharyngeal phase: Secondary | ICD-10-CM | POA: Diagnosis not present

## 2019-10-31 DIAGNOSIS — R278 Other lack of coordination: Secondary | ICD-10-CM | POA: Diagnosis not present

## 2019-10-31 DIAGNOSIS — R2689 Other abnormalities of gait and mobility: Secondary | ICD-10-CM | POA: Diagnosis not present

## 2019-10-31 DIAGNOSIS — K219 Gastro-esophageal reflux disease without esophagitis: Secondary | ICD-10-CM | POA: Diagnosis not present

## 2019-10-31 DIAGNOSIS — M6281 Muscle weakness (generalized): Secondary | ICD-10-CM | POA: Diagnosis not present

## 2019-10-31 DIAGNOSIS — I10 Essential (primary) hypertension: Secondary | ICD-10-CM | POA: Diagnosis not present

## 2019-10-31 DIAGNOSIS — Z431 Encounter for attention to gastrostomy: Secondary | ICD-10-CM | POA: Diagnosis not present

## 2019-10-31 DIAGNOSIS — Z20828 Contact with and (suspected) exposure to other viral communicable diseases: Secondary | ICD-10-CM | POA: Diagnosis not present

## 2019-10-31 DIAGNOSIS — S638X2D Sprain of other part of left wrist and hand, subsequent encounter: Secondary | ICD-10-CM | POA: Diagnosis not present

## 2019-10-31 DIAGNOSIS — M455 Ankylosing spondylitis of thoracolumbar region: Secondary | ICD-10-CM | POA: Diagnosis not present

## 2019-12-06 DIAGNOSIS — Z23 Encounter for immunization: Secondary | ICD-10-CM | POA: Diagnosis not present

## 2019-12-10 DIAGNOSIS — N3289 Other specified disorders of bladder: Secondary | ICD-10-CM | POA: Diagnosis not present

## 2019-12-10 DIAGNOSIS — M25562 Pain in left knee: Secondary | ICD-10-CM | POA: Diagnosis not present

## 2019-12-10 DIAGNOSIS — K047 Periapical abscess without sinus: Secondary | ICD-10-CM | POA: Diagnosis not present

## 2019-12-10 DIAGNOSIS — Z66 Do not resuscitate: Secondary | ICD-10-CM | POA: Diagnosis not present

## 2019-12-10 DIAGNOSIS — M455 Ankylosing spondylitis of thoracolumbar region: Secondary | ICD-10-CM | POA: Diagnosis not present

## 2019-12-10 DIAGNOSIS — U071 COVID-19: Secondary | ICD-10-CM | POA: Diagnosis not present

## 2019-12-10 DIAGNOSIS — R110 Nausea: Secondary | ICD-10-CM | POA: Diagnosis not present

## 2019-12-10 DIAGNOSIS — S638X2D Sprain of other part of left wrist and hand, subsequent encounter: Secondary | ICD-10-CM | POA: Diagnosis not present

## 2019-12-10 DIAGNOSIS — R319 Hematuria, unspecified: Secondary | ICD-10-CM | POA: Diagnosis not present

## 2019-12-10 DIAGNOSIS — N3020 Other chronic cystitis without hematuria: Secondary | ICD-10-CM | POA: Diagnosis not present

## 2019-12-10 DIAGNOSIS — E559 Vitamin D deficiency, unspecified: Secondary | ICD-10-CM | POA: Diagnosis not present

## 2019-12-10 DIAGNOSIS — L89312 Pressure ulcer of right buttock, stage 2: Secondary | ICD-10-CM | POA: Diagnosis not present

## 2019-12-10 DIAGNOSIS — Z79899 Other long term (current) drug therapy: Secondary | ICD-10-CM | POA: Diagnosis not present

## 2019-12-10 DIAGNOSIS — R300 Dysuria: Secondary | ICD-10-CM | POA: Diagnosis not present

## 2019-12-10 DIAGNOSIS — Z23 Encounter for immunization: Secondary | ICD-10-CM | POA: Diagnosis not present

## 2019-12-10 DIAGNOSIS — M810 Age-related osteoporosis without current pathological fracture: Secondary | ICD-10-CM | POA: Diagnosis not present

## 2019-12-10 DIAGNOSIS — Z20828 Contact with and (suspected) exposure to other viral communicable diseases: Secondary | ICD-10-CM | POA: Diagnosis not present

## 2019-12-10 DIAGNOSIS — R2689 Other abnormalities of gait and mobility: Secondary | ICD-10-CM | POA: Diagnosis not present

## 2019-12-10 DIAGNOSIS — K219 Gastro-esophageal reflux disease without esophagitis: Secondary | ICD-10-CM | POA: Diagnosis not present

## 2019-12-10 DIAGNOSIS — R1312 Dysphagia, oropharyngeal phase: Secondary | ICD-10-CM | POA: Diagnosis not present

## 2019-12-10 DIAGNOSIS — D649 Anemia, unspecified: Secondary | ICD-10-CM | POA: Diagnosis not present

## 2019-12-10 DIAGNOSIS — R2681 Unsteadiness on feet: Secondary | ICD-10-CM | POA: Diagnosis not present

## 2019-12-10 DIAGNOSIS — G4733 Obstructive sleep apnea (adult) (pediatric): Secondary | ICD-10-CM | POA: Diagnosis not present

## 2019-12-10 DIAGNOSIS — E119 Type 2 diabetes mellitus without complications: Secondary | ICD-10-CM | POA: Diagnosis not present

## 2019-12-10 DIAGNOSIS — M25561 Pain in right knee: Secondary | ICD-10-CM | POA: Diagnosis not present

## 2019-12-10 DIAGNOSIS — E785 Hyperlipidemia, unspecified: Secondary | ICD-10-CM | POA: Diagnosis not present

## 2019-12-10 DIAGNOSIS — I10 Essential (primary) hypertension: Secondary | ICD-10-CM | POA: Diagnosis not present

## 2019-12-10 DIAGNOSIS — E039 Hypothyroidism, unspecified: Secondary | ICD-10-CM | POA: Diagnosis not present

## 2019-12-10 DIAGNOSIS — G8929 Other chronic pain: Secondary | ICD-10-CM | POA: Diagnosis not present

## 2019-12-10 DIAGNOSIS — I5032 Chronic diastolic (congestive) heart failure: Secondary | ICD-10-CM | POA: Diagnosis not present

## 2019-12-10 DIAGNOSIS — N390 Urinary tract infection, site not specified: Secondary | ICD-10-CM | POA: Diagnosis not present

## 2019-12-10 DIAGNOSIS — G4709 Other insomnia: Secondary | ICD-10-CM | POA: Diagnosis not present

## 2019-12-10 DIAGNOSIS — N323 Diverticulum of bladder: Secondary | ICD-10-CM | POA: Diagnosis not present

## 2019-12-11 DIAGNOSIS — U071 COVID-19: Secondary | ICD-10-CM | POA: Diagnosis not present

## 2019-12-11 DIAGNOSIS — R110 Nausea: Secondary | ICD-10-CM | POA: Diagnosis not present

## 2019-12-11 DIAGNOSIS — I5032 Chronic diastolic (congestive) heart failure: Secondary | ICD-10-CM | POA: Diagnosis not present

## 2019-12-11 DIAGNOSIS — K219 Gastro-esophageal reflux disease without esophagitis: Secondary | ICD-10-CM | POA: Diagnosis not present

## 2019-12-20 DIAGNOSIS — Z66 Do not resuscitate: Secondary | ICD-10-CM | POA: Diagnosis not present

## 2019-12-20 DIAGNOSIS — I10 Essential (primary) hypertension: Secondary | ICD-10-CM | POA: Diagnosis not present

## 2019-12-20 DIAGNOSIS — G8929 Other chronic pain: Secondary | ICD-10-CM | POA: Diagnosis not present

## 2019-12-20 DIAGNOSIS — G4733 Obstructive sleep apnea (adult) (pediatric): Secondary | ICD-10-CM | POA: Diagnosis not present

## 2019-12-26 DIAGNOSIS — I10 Essential (primary) hypertension: Secondary | ICD-10-CM | POA: Diagnosis not present

## 2019-12-26 DIAGNOSIS — U071 COVID-19: Secondary | ICD-10-CM | POA: Diagnosis not present

## 2019-12-26 DIAGNOSIS — K219 Gastro-esophageal reflux disease without esophagitis: Secondary | ICD-10-CM | POA: Diagnosis not present

## 2019-12-26 DIAGNOSIS — R110 Nausea: Secondary | ICD-10-CM | POA: Diagnosis not present

## 2019-12-31 DIAGNOSIS — G4709 Other insomnia: Secondary | ICD-10-CM | POA: Diagnosis not present

## 2019-12-31 DIAGNOSIS — N3289 Other specified disorders of bladder: Secondary | ICD-10-CM | POA: Diagnosis not present

## 2019-12-31 DIAGNOSIS — I5032 Chronic diastolic (congestive) heart failure: Secondary | ICD-10-CM | POA: Diagnosis not present

## 2019-12-31 DIAGNOSIS — I10 Essential (primary) hypertension: Secondary | ICD-10-CM | POA: Diagnosis not present

## 2019-12-31 DIAGNOSIS — U071 COVID-19: Secondary | ICD-10-CM | POA: Diagnosis not present

## 2020-01-03 DIAGNOSIS — K219 Gastro-esophageal reflux disease without esophagitis: Secondary | ICD-10-CM | POA: Diagnosis not present

## 2020-01-03 DIAGNOSIS — Z23 Encounter for immunization: Secondary | ICD-10-CM | POA: Diagnosis not present

## 2020-01-03 DIAGNOSIS — U071 COVID-19: Secondary | ICD-10-CM | POA: Diagnosis not present

## 2020-01-03 DIAGNOSIS — I5032 Chronic diastolic (congestive) heart failure: Secondary | ICD-10-CM | POA: Diagnosis not present

## 2020-01-03 DIAGNOSIS — I10 Essential (primary) hypertension: Secondary | ICD-10-CM | POA: Diagnosis not present

## 2020-01-08 DIAGNOSIS — I5032 Chronic diastolic (congestive) heart failure: Secondary | ICD-10-CM | POA: Diagnosis not present

## 2020-01-08 DIAGNOSIS — U071 COVID-19: Secondary | ICD-10-CM | POA: Diagnosis not present

## 2020-01-08 DIAGNOSIS — K219 Gastro-esophageal reflux disease without esophagitis: Secondary | ICD-10-CM | POA: Diagnosis not present

## 2020-01-08 DIAGNOSIS — R110 Nausea: Secondary | ICD-10-CM | POA: Diagnosis not present

## 2020-01-22 DIAGNOSIS — K219 Gastro-esophageal reflux disease without esophagitis: Secondary | ICD-10-CM | POA: Diagnosis not present

## 2020-01-22 DIAGNOSIS — N3020 Other chronic cystitis without hematuria: Secondary | ICD-10-CM | POA: Diagnosis not present

## 2020-01-22 DIAGNOSIS — K047 Periapical abscess without sinus: Secondary | ICD-10-CM | POA: Diagnosis not present

## 2020-01-22 DIAGNOSIS — M810 Age-related osteoporosis without current pathological fracture: Secondary | ICD-10-CM | POA: Diagnosis not present

## 2020-01-24 DIAGNOSIS — K047 Periapical abscess without sinus: Secondary | ICD-10-CM | POA: Diagnosis not present

## 2020-01-24 DIAGNOSIS — I5032 Chronic diastolic (congestive) heart failure: Secondary | ICD-10-CM | POA: Diagnosis not present

## 2020-01-24 DIAGNOSIS — I10 Essential (primary) hypertension: Secondary | ICD-10-CM | POA: Diagnosis not present

## 2020-01-24 DIAGNOSIS — K219 Gastro-esophageal reflux disease without esophagitis: Secondary | ICD-10-CM | POA: Diagnosis not present

## 2020-02-07 DIAGNOSIS — G4733 Obstructive sleep apnea (adult) (pediatric): Secondary | ICD-10-CM | POA: Diagnosis not present

## 2020-02-07 DIAGNOSIS — I10 Essential (primary) hypertension: Secondary | ICD-10-CM | POA: Diagnosis not present

## 2020-02-07 DIAGNOSIS — Z66 Do not resuscitate: Secondary | ICD-10-CM | POA: Diagnosis not present

## 2020-02-07 DIAGNOSIS — G8929 Other chronic pain: Secondary | ICD-10-CM | POA: Diagnosis not present

## 2020-02-18 DIAGNOSIS — K219 Gastro-esophageal reflux disease without esophagitis: Secondary | ICD-10-CM | POA: Diagnosis not present

## 2020-02-18 DIAGNOSIS — I5032 Chronic diastolic (congestive) heart failure: Secondary | ICD-10-CM | POA: Diagnosis not present

## 2020-02-18 DIAGNOSIS — R300 Dysuria: Secondary | ICD-10-CM | POA: Diagnosis not present

## 2020-02-18 DIAGNOSIS — N323 Diverticulum of bladder: Secondary | ICD-10-CM | POA: Diagnosis not present

## 2020-02-18 DIAGNOSIS — N3289 Other specified disorders of bladder: Secondary | ICD-10-CM | POA: Diagnosis not present

## 2020-02-18 DIAGNOSIS — L89312 Pressure ulcer of right buttock, stage 2: Secondary | ICD-10-CM | POA: Diagnosis not present

## 2020-02-18 DIAGNOSIS — G4709 Other insomnia: Secondary | ICD-10-CM | POA: Diagnosis not present

## 2020-03-25 DIAGNOSIS — M25561 Pain in right knee: Secondary | ICD-10-CM | POA: Diagnosis not present

## 2020-03-25 DIAGNOSIS — R300 Dysuria: Secondary | ICD-10-CM | POA: Diagnosis not present

## 2020-03-25 DIAGNOSIS — M25562 Pain in left knee: Secondary | ICD-10-CM | POA: Diagnosis not present

## 2020-03-25 DIAGNOSIS — L89312 Pressure ulcer of right buttock, stage 2: Secondary | ICD-10-CM | POA: Diagnosis not present

## 2020-04-01 DIAGNOSIS — Z20828 Contact with and (suspected) exposure to other viral communicable diseases: Secondary | ICD-10-CM | POA: Diagnosis not present

## 2020-04-02 DIAGNOSIS — Z961 Presence of intraocular lens: Secondary | ICD-10-CM | POA: Diagnosis not present

## 2020-04-02 DIAGNOSIS — H527 Unspecified disorder of refraction: Secondary | ICD-10-CM | POA: Diagnosis not present

## 2020-04-02 DIAGNOSIS — H25812 Combined forms of age-related cataract, left eye: Secondary | ICD-10-CM | POA: Diagnosis not present

## 2020-04-03 DIAGNOSIS — I5032 Chronic diastolic (congestive) heart failure: Secondary | ICD-10-CM | POA: Diagnosis not present

## 2020-04-03 DIAGNOSIS — L89312 Pressure ulcer of right buttock, stage 2: Secondary | ICD-10-CM | POA: Diagnosis not present

## 2020-04-03 DIAGNOSIS — I10 Essential (primary) hypertension: Secondary | ICD-10-CM | POA: Diagnosis not present

## 2020-04-03 DIAGNOSIS — N390 Urinary tract infection, site not specified: Secondary | ICD-10-CM | POA: Diagnosis not present

## 2020-04-03 DIAGNOSIS — D6489 Other specified anemias: Secondary | ICD-10-CM | POA: Diagnosis not present

## 2020-04-04 DIAGNOSIS — R1312 Dysphagia, oropharyngeal phase: Secondary | ICD-10-CM | POA: Diagnosis not present

## 2020-04-04 DIAGNOSIS — M455 Ankylosing spondylitis of thoracolumbar region: Secondary | ICD-10-CM | POA: Diagnosis not present

## 2020-04-04 DIAGNOSIS — I10 Essential (primary) hypertension: Secondary | ICD-10-CM | POA: Diagnosis not present

## 2020-04-04 DIAGNOSIS — K219 Gastro-esophageal reflux disease without esophagitis: Secondary | ICD-10-CM | POA: Diagnosis not present

## 2020-04-04 DIAGNOSIS — S638X2D Sprain of other part of left wrist and hand, subsequent encounter: Secondary | ICD-10-CM | POA: Diagnosis not present

## 2020-04-04 DIAGNOSIS — R2681 Unsteadiness on feet: Secondary | ICD-10-CM | POA: Diagnosis not present

## 2020-04-04 DIAGNOSIS — R2689 Other abnormalities of gait and mobility: Secondary | ICD-10-CM | POA: Diagnosis not present

## 2020-04-04 DIAGNOSIS — M6281 Muscle weakness (generalized): Secondary | ICD-10-CM | POA: Diagnosis not present

## 2020-04-04 DIAGNOSIS — I5032 Chronic diastolic (congestive) heart failure: Secondary | ICD-10-CM | POA: Diagnosis not present

## 2020-04-05 DIAGNOSIS — I5032 Chronic diastolic (congestive) heart failure: Secondary | ICD-10-CM | POA: Diagnosis not present

## 2020-04-05 DIAGNOSIS — K219 Gastro-esophageal reflux disease without esophagitis: Secondary | ICD-10-CM | POA: Diagnosis not present

## 2020-04-05 DIAGNOSIS — S638X2D Sprain of other part of left wrist and hand, subsequent encounter: Secondary | ICD-10-CM | POA: Diagnosis not present

## 2020-04-05 DIAGNOSIS — M455 Ankylosing spondylitis of thoracolumbar region: Secondary | ICD-10-CM | POA: Diagnosis not present

## 2020-04-05 DIAGNOSIS — R1312 Dysphagia, oropharyngeal phase: Secondary | ICD-10-CM | POA: Diagnosis not present

## 2020-04-05 DIAGNOSIS — I10 Essential (primary) hypertension: Secondary | ICD-10-CM | POA: Diagnosis not present

## 2020-04-06 DIAGNOSIS — R319 Hematuria, unspecified: Secondary | ICD-10-CM | POA: Diagnosis not present

## 2020-04-06 DIAGNOSIS — Z79899 Other long term (current) drug therapy: Secondary | ICD-10-CM | POA: Diagnosis not present

## 2020-04-06 DIAGNOSIS — N390 Urinary tract infection, site not specified: Secondary | ICD-10-CM | POA: Diagnosis not present

## 2020-04-07 DIAGNOSIS — S638X2D Sprain of other part of left wrist and hand, subsequent encounter: Secondary | ICD-10-CM | POA: Diagnosis not present

## 2020-04-07 DIAGNOSIS — K219 Gastro-esophageal reflux disease without esophagitis: Secondary | ICD-10-CM | POA: Diagnosis not present

## 2020-04-07 DIAGNOSIS — I5032 Chronic diastolic (congestive) heart failure: Secondary | ICD-10-CM | POA: Diagnosis not present

## 2020-04-07 DIAGNOSIS — I10 Essential (primary) hypertension: Secondary | ICD-10-CM | POA: Diagnosis not present

## 2020-04-07 DIAGNOSIS — M455 Ankylosing spondylitis of thoracolumbar region: Secondary | ICD-10-CM | POA: Diagnosis not present

## 2020-04-07 DIAGNOSIS — R1312 Dysphagia, oropharyngeal phase: Secondary | ICD-10-CM | POA: Diagnosis not present

## 2020-04-08 DIAGNOSIS — R1312 Dysphagia, oropharyngeal phase: Secondary | ICD-10-CM | POA: Diagnosis not present

## 2020-04-08 DIAGNOSIS — K219 Gastro-esophageal reflux disease without esophagitis: Secondary | ICD-10-CM | POA: Diagnosis not present

## 2020-04-08 DIAGNOSIS — S638X2D Sprain of other part of left wrist and hand, subsequent encounter: Secondary | ICD-10-CM | POA: Diagnosis not present

## 2020-04-08 DIAGNOSIS — I5032 Chronic diastolic (congestive) heart failure: Secondary | ICD-10-CM | POA: Diagnosis not present

## 2020-04-08 DIAGNOSIS — I10 Essential (primary) hypertension: Secondary | ICD-10-CM | POA: Diagnosis not present

## 2020-04-08 DIAGNOSIS — M455 Ankylosing spondylitis of thoracolumbar region: Secondary | ICD-10-CM | POA: Diagnosis not present

## 2020-04-09 DIAGNOSIS — Z20828 Contact with and (suspected) exposure to other viral communicable diseases: Secondary | ICD-10-CM | POA: Diagnosis not present

## 2020-04-09 DIAGNOSIS — M455 Ankylosing spondylitis of thoracolumbar region: Secondary | ICD-10-CM | POA: Diagnosis not present

## 2020-04-09 DIAGNOSIS — I5032 Chronic diastolic (congestive) heart failure: Secondary | ICD-10-CM | POA: Diagnosis not present

## 2020-04-09 DIAGNOSIS — R1312 Dysphagia, oropharyngeal phase: Secondary | ICD-10-CM | POA: Diagnosis not present

## 2020-04-09 DIAGNOSIS — K219 Gastro-esophageal reflux disease without esophagitis: Secondary | ICD-10-CM | POA: Diagnosis not present

## 2020-04-09 DIAGNOSIS — I10 Essential (primary) hypertension: Secondary | ICD-10-CM | POA: Diagnosis not present

## 2020-04-09 DIAGNOSIS — S638X2D Sprain of other part of left wrist and hand, subsequent encounter: Secondary | ICD-10-CM | POA: Diagnosis not present

## 2020-04-11 DIAGNOSIS — I5032 Chronic diastolic (congestive) heart failure: Secondary | ICD-10-CM | POA: Diagnosis not present

## 2020-04-11 DIAGNOSIS — R1312 Dysphagia, oropharyngeal phase: Secondary | ICD-10-CM | POA: Diagnosis not present

## 2020-04-11 DIAGNOSIS — I10 Essential (primary) hypertension: Secondary | ICD-10-CM | POA: Diagnosis not present

## 2020-04-11 DIAGNOSIS — M455 Ankylosing spondylitis of thoracolumbar region: Secondary | ICD-10-CM | POA: Diagnosis not present

## 2020-04-11 DIAGNOSIS — S638X2D Sprain of other part of left wrist and hand, subsequent encounter: Secondary | ICD-10-CM | POA: Diagnosis not present

## 2020-04-11 DIAGNOSIS — K219 Gastro-esophageal reflux disease without esophagitis: Secondary | ICD-10-CM | POA: Diagnosis not present

## 2020-04-13 DIAGNOSIS — M455 Ankylosing spondylitis of thoracolumbar region: Secondary | ICD-10-CM | POA: Diagnosis not present

## 2020-04-13 DIAGNOSIS — I5032 Chronic diastolic (congestive) heart failure: Secondary | ICD-10-CM | POA: Diagnosis not present

## 2020-04-13 DIAGNOSIS — S638X2D Sprain of other part of left wrist and hand, subsequent encounter: Secondary | ICD-10-CM | POA: Diagnosis not present

## 2020-04-13 DIAGNOSIS — R1312 Dysphagia, oropharyngeal phase: Secondary | ICD-10-CM | POA: Diagnosis not present

## 2020-04-13 DIAGNOSIS — K219 Gastro-esophageal reflux disease without esophagitis: Secondary | ICD-10-CM | POA: Diagnosis not present

## 2020-04-13 DIAGNOSIS — I10 Essential (primary) hypertension: Secondary | ICD-10-CM | POA: Diagnosis not present

## 2020-04-15 DIAGNOSIS — I5032 Chronic diastolic (congestive) heart failure: Secondary | ICD-10-CM | POA: Diagnosis not present

## 2020-04-15 DIAGNOSIS — S638X2D Sprain of other part of left wrist and hand, subsequent encounter: Secondary | ICD-10-CM | POA: Diagnosis not present

## 2020-04-15 DIAGNOSIS — M455 Ankylosing spondylitis of thoracolumbar region: Secondary | ICD-10-CM | POA: Diagnosis not present

## 2020-04-15 DIAGNOSIS — K219 Gastro-esophageal reflux disease without esophagitis: Secondary | ICD-10-CM | POA: Diagnosis not present

## 2020-04-15 DIAGNOSIS — I10 Essential (primary) hypertension: Secondary | ICD-10-CM | POA: Diagnosis not present

## 2020-04-15 DIAGNOSIS — R1312 Dysphagia, oropharyngeal phase: Secondary | ICD-10-CM | POA: Diagnosis not present

## 2020-04-16 DIAGNOSIS — K219 Gastro-esophageal reflux disease without esophagitis: Secondary | ICD-10-CM | POA: Diagnosis not present

## 2020-04-16 DIAGNOSIS — M455 Ankylosing spondylitis of thoracolumbar region: Secondary | ICD-10-CM | POA: Diagnosis not present

## 2020-04-16 DIAGNOSIS — I10 Essential (primary) hypertension: Secondary | ICD-10-CM | POA: Diagnosis not present

## 2020-04-16 DIAGNOSIS — S638X2D Sprain of other part of left wrist and hand, subsequent encounter: Secondary | ICD-10-CM | POA: Diagnosis not present

## 2020-04-16 DIAGNOSIS — I5032 Chronic diastolic (congestive) heart failure: Secondary | ICD-10-CM | POA: Diagnosis not present

## 2020-04-16 DIAGNOSIS — R1312 Dysphagia, oropharyngeal phase: Secondary | ICD-10-CM | POA: Diagnosis not present

## 2020-04-17 DIAGNOSIS — I10 Essential (primary) hypertension: Secondary | ICD-10-CM | POA: Diagnosis not present

## 2020-04-17 DIAGNOSIS — I5032 Chronic diastolic (congestive) heart failure: Secondary | ICD-10-CM | POA: Diagnosis not present

## 2020-04-17 DIAGNOSIS — R1312 Dysphagia, oropharyngeal phase: Secondary | ICD-10-CM | POA: Diagnosis not present

## 2020-04-17 DIAGNOSIS — M455 Ankylosing spondylitis of thoracolumbar region: Secondary | ICD-10-CM | POA: Diagnosis not present

## 2020-04-17 DIAGNOSIS — K219 Gastro-esophageal reflux disease without esophagitis: Secondary | ICD-10-CM | POA: Diagnosis not present

## 2020-04-17 DIAGNOSIS — S638X2D Sprain of other part of left wrist and hand, subsequent encounter: Secondary | ICD-10-CM | POA: Diagnosis not present

## 2020-04-18 DIAGNOSIS — R1312 Dysphagia, oropharyngeal phase: Secondary | ICD-10-CM | POA: Diagnosis not present

## 2020-04-18 DIAGNOSIS — S638X2D Sprain of other part of left wrist and hand, subsequent encounter: Secondary | ICD-10-CM | POA: Diagnosis not present

## 2020-04-18 DIAGNOSIS — M455 Ankylosing spondylitis of thoracolumbar region: Secondary | ICD-10-CM | POA: Diagnosis not present

## 2020-04-18 DIAGNOSIS — I5032 Chronic diastolic (congestive) heart failure: Secondary | ICD-10-CM | POA: Diagnosis not present

## 2020-04-18 DIAGNOSIS — I10 Essential (primary) hypertension: Secondary | ICD-10-CM | POA: Diagnosis not present

## 2020-04-18 DIAGNOSIS — K219 Gastro-esophageal reflux disease without esophagitis: Secondary | ICD-10-CM | POA: Diagnosis not present

## 2020-04-19 DIAGNOSIS — R1312 Dysphagia, oropharyngeal phase: Secondary | ICD-10-CM | POA: Diagnosis not present

## 2020-04-19 DIAGNOSIS — K219 Gastro-esophageal reflux disease without esophagitis: Secondary | ICD-10-CM | POA: Diagnosis not present

## 2020-04-19 DIAGNOSIS — M455 Ankylosing spondylitis of thoracolumbar region: Secondary | ICD-10-CM | POA: Diagnosis not present

## 2020-04-19 DIAGNOSIS — I5032 Chronic diastolic (congestive) heart failure: Secondary | ICD-10-CM | POA: Diagnosis not present

## 2020-04-19 DIAGNOSIS — S638X2D Sprain of other part of left wrist and hand, subsequent encounter: Secondary | ICD-10-CM | POA: Diagnosis not present

## 2020-04-19 DIAGNOSIS — I10 Essential (primary) hypertension: Secondary | ICD-10-CM | POA: Diagnosis not present

## 2020-04-22 DIAGNOSIS — K219 Gastro-esophageal reflux disease without esophagitis: Secondary | ICD-10-CM | POA: Diagnosis not present

## 2020-04-22 DIAGNOSIS — B351 Tinea unguium: Secondary | ICD-10-CM | POA: Diagnosis not present

## 2020-04-22 DIAGNOSIS — I739 Peripheral vascular disease, unspecified: Secondary | ICD-10-CM | POA: Diagnosis not present

## 2020-04-22 DIAGNOSIS — R1312 Dysphagia, oropharyngeal phase: Secondary | ICD-10-CM | POA: Diagnosis not present

## 2020-04-22 DIAGNOSIS — L603 Nail dystrophy: Secondary | ICD-10-CM | POA: Diagnosis not present

## 2020-04-22 DIAGNOSIS — I5032 Chronic diastolic (congestive) heart failure: Secondary | ICD-10-CM | POA: Diagnosis not present

## 2020-04-22 DIAGNOSIS — I10 Essential (primary) hypertension: Secondary | ICD-10-CM | POA: Diagnosis not present

## 2020-04-22 DIAGNOSIS — Q845 Enlarged and hypertrophic nails: Secondary | ICD-10-CM | POA: Diagnosis not present

## 2020-04-22 DIAGNOSIS — M455 Ankylosing spondylitis of thoracolumbar region: Secondary | ICD-10-CM | POA: Diagnosis not present

## 2020-04-22 DIAGNOSIS — S638X2D Sprain of other part of left wrist and hand, subsequent encounter: Secondary | ICD-10-CM | POA: Diagnosis not present

## 2020-04-23 DIAGNOSIS — M455 Ankylosing spondylitis of thoracolumbar region: Secondary | ICD-10-CM | POA: Diagnosis not present

## 2020-04-23 DIAGNOSIS — S638X2D Sprain of other part of left wrist and hand, subsequent encounter: Secondary | ICD-10-CM | POA: Diagnosis not present

## 2020-04-23 DIAGNOSIS — I10 Essential (primary) hypertension: Secondary | ICD-10-CM | POA: Diagnosis not present

## 2020-04-23 DIAGNOSIS — R1312 Dysphagia, oropharyngeal phase: Secondary | ICD-10-CM | POA: Diagnosis not present

## 2020-04-23 DIAGNOSIS — I5032 Chronic diastolic (congestive) heart failure: Secondary | ICD-10-CM | POA: Diagnosis not present

## 2020-04-23 DIAGNOSIS — K219 Gastro-esophageal reflux disease without esophagitis: Secondary | ICD-10-CM | POA: Diagnosis not present

## 2020-04-24 DIAGNOSIS — K219 Gastro-esophageal reflux disease without esophagitis: Secondary | ICD-10-CM | POA: Diagnosis not present

## 2020-04-24 DIAGNOSIS — I10 Essential (primary) hypertension: Secondary | ICD-10-CM | POA: Diagnosis not present

## 2020-04-24 DIAGNOSIS — S638X2D Sprain of other part of left wrist and hand, subsequent encounter: Secondary | ICD-10-CM | POA: Diagnosis not present

## 2020-04-24 DIAGNOSIS — M455 Ankylosing spondylitis of thoracolumbar region: Secondary | ICD-10-CM | POA: Diagnosis not present

## 2020-04-24 DIAGNOSIS — R1312 Dysphagia, oropharyngeal phase: Secondary | ICD-10-CM | POA: Diagnosis not present

## 2020-04-24 DIAGNOSIS — I5032 Chronic diastolic (congestive) heart failure: Secondary | ICD-10-CM | POA: Diagnosis not present

## 2020-04-25 DIAGNOSIS — I5032 Chronic diastolic (congestive) heart failure: Secondary | ICD-10-CM | POA: Diagnosis not present

## 2020-04-25 DIAGNOSIS — K219 Gastro-esophageal reflux disease without esophagitis: Secondary | ICD-10-CM | POA: Diagnosis not present

## 2020-04-25 DIAGNOSIS — M455 Ankylosing spondylitis of thoracolumbar region: Secondary | ICD-10-CM | POA: Diagnosis not present

## 2020-04-25 DIAGNOSIS — I10 Essential (primary) hypertension: Secondary | ICD-10-CM | POA: Diagnosis not present

## 2020-04-25 DIAGNOSIS — R1312 Dysphagia, oropharyngeal phase: Secondary | ICD-10-CM | POA: Diagnosis not present

## 2020-04-25 DIAGNOSIS — S638X2D Sprain of other part of left wrist and hand, subsequent encounter: Secondary | ICD-10-CM | POA: Diagnosis not present

## 2020-04-26 DIAGNOSIS — M455 Ankylosing spondylitis of thoracolumbar region: Secondary | ICD-10-CM | POA: Diagnosis not present

## 2020-04-26 DIAGNOSIS — I10 Essential (primary) hypertension: Secondary | ICD-10-CM | POA: Diagnosis not present

## 2020-04-26 DIAGNOSIS — R1312 Dysphagia, oropharyngeal phase: Secondary | ICD-10-CM | POA: Diagnosis not present

## 2020-04-26 DIAGNOSIS — K219 Gastro-esophageal reflux disease without esophagitis: Secondary | ICD-10-CM | POA: Diagnosis not present

## 2020-04-26 DIAGNOSIS — I5032 Chronic diastolic (congestive) heart failure: Secondary | ICD-10-CM | POA: Diagnosis not present

## 2020-04-26 DIAGNOSIS — S638X2D Sprain of other part of left wrist and hand, subsequent encounter: Secondary | ICD-10-CM | POA: Diagnosis not present

## 2020-04-29 DIAGNOSIS — M455 Ankylosing spondylitis of thoracolumbar region: Secondary | ICD-10-CM | POA: Diagnosis not present

## 2020-04-29 DIAGNOSIS — K219 Gastro-esophageal reflux disease without esophagitis: Secondary | ICD-10-CM | POA: Diagnosis not present

## 2020-04-29 DIAGNOSIS — S638X2D Sprain of other part of left wrist and hand, subsequent encounter: Secondary | ICD-10-CM | POA: Diagnosis not present

## 2020-04-29 DIAGNOSIS — I10 Essential (primary) hypertension: Secondary | ICD-10-CM | POA: Diagnosis not present

## 2020-04-29 DIAGNOSIS — I5032 Chronic diastolic (congestive) heart failure: Secondary | ICD-10-CM | POA: Diagnosis not present

## 2020-04-29 DIAGNOSIS — R1312 Dysphagia, oropharyngeal phase: Secondary | ICD-10-CM | POA: Diagnosis not present

## 2020-04-30 DIAGNOSIS — R2681 Unsteadiness on feet: Secondary | ICD-10-CM | POA: Diagnosis not present

## 2020-04-30 DIAGNOSIS — K219 Gastro-esophageal reflux disease without esophagitis: Secondary | ICD-10-CM | POA: Diagnosis not present

## 2020-04-30 DIAGNOSIS — S638X2D Sprain of other part of left wrist and hand, subsequent encounter: Secondary | ICD-10-CM | POA: Diagnosis not present

## 2020-04-30 DIAGNOSIS — M455 Ankylosing spondylitis of thoracolumbar region: Secondary | ICD-10-CM | POA: Diagnosis not present

## 2020-04-30 DIAGNOSIS — R2689 Other abnormalities of gait and mobility: Secondary | ICD-10-CM | POA: Diagnosis not present

## 2020-04-30 DIAGNOSIS — M6281 Muscle weakness (generalized): Secondary | ICD-10-CM | POA: Diagnosis not present

## 2020-04-30 DIAGNOSIS — R1312 Dysphagia, oropharyngeal phase: Secondary | ICD-10-CM | POA: Diagnosis not present

## 2020-04-30 DIAGNOSIS — I10 Essential (primary) hypertension: Secondary | ICD-10-CM | POA: Diagnosis not present

## 2020-05-01 DIAGNOSIS — S638X2D Sprain of other part of left wrist and hand, subsequent encounter: Secondary | ICD-10-CM | POA: Diagnosis not present

## 2020-05-01 DIAGNOSIS — N390 Urinary tract infection, site not specified: Secondary | ICD-10-CM | POA: Diagnosis not present

## 2020-05-01 DIAGNOSIS — D6489 Other specified anemias: Secondary | ICD-10-CM | POA: Diagnosis not present

## 2020-05-01 DIAGNOSIS — N3289 Other specified disorders of bladder: Secondary | ICD-10-CM | POA: Diagnosis not present

## 2020-05-01 DIAGNOSIS — I10 Essential (primary) hypertension: Secondary | ICD-10-CM | POA: Diagnosis not present

## 2020-05-01 DIAGNOSIS — R300 Dysuria: Secondary | ICD-10-CM | POA: Diagnosis not present

## 2020-05-01 DIAGNOSIS — K219 Gastro-esophageal reflux disease without esophagitis: Secondary | ICD-10-CM | POA: Diagnosis not present

## 2020-05-01 DIAGNOSIS — M6281 Muscle weakness (generalized): Secondary | ICD-10-CM | POA: Diagnosis not present

## 2020-05-01 DIAGNOSIS — R1312 Dysphagia, oropharyngeal phase: Secondary | ICD-10-CM | POA: Diagnosis not present

## 2020-05-01 DIAGNOSIS — M455 Ankylosing spondylitis of thoracolumbar region: Secondary | ICD-10-CM | POA: Diagnosis not present

## 2020-05-02 DIAGNOSIS — M6281 Muscle weakness (generalized): Secondary | ICD-10-CM | POA: Diagnosis not present

## 2020-05-02 DIAGNOSIS — I10 Essential (primary) hypertension: Secondary | ICD-10-CM | POA: Diagnosis not present

## 2020-05-02 DIAGNOSIS — K219 Gastro-esophageal reflux disease without esophagitis: Secondary | ICD-10-CM | POA: Diagnosis not present

## 2020-05-02 DIAGNOSIS — M455 Ankylosing spondylitis of thoracolumbar region: Secondary | ICD-10-CM | POA: Diagnosis not present

## 2020-05-02 DIAGNOSIS — S638X2D Sprain of other part of left wrist and hand, subsequent encounter: Secondary | ICD-10-CM | POA: Diagnosis not present

## 2020-05-02 DIAGNOSIS — R1312 Dysphagia, oropharyngeal phase: Secondary | ICD-10-CM | POA: Diagnosis not present

## 2020-05-03 DIAGNOSIS — K219 Gastro-esophageal reflux disease without esophagitis: Secondary | ICD-10-CM | POA: Diagnosis not present

## 2020-05-03 DIAGNOSIS — I10 Essential (primary) hypertension: Secondary | ICD-10-CM | POA: Diagnosis not present

## 2020-05-03 DIAGNOSIS — R1312 Dysphagia, oropharyngeal phase: Secondary | ICD-10-CM | POA: Diagnosis not present

## 2020-05-03 DIAGNOSIS — M6281 Muscle weakness (generalized): Secondary | ICD-10-CM | POA: Diagnosis not present

## 2020-05-03 DIAGNOSIS — S638X2D Sprain of other part of left wrist and hand, subsequent encounter: Secondary | ICD-10-CM | POA: Diagnosis not present

## 2020-05-03 DIAGNOSIS — M455 Ankylosing spondylitis of thoracolumbar region: Secondary | ICD-10-CM | POA: Diagnosis not present

## 2020-05-04 DIAGNOSIS — Z03818 Encounter for observation for suspected exposure to other biological agents ruled out: Secondary | ICD-10-CM | POA: Diagnosis not present

## 2020-05-06 DIAGNOSIS — S638X2D Sprain of other part of left wrist and hand, subsequent encounter: Secondary | ICD-10-CM | POA: Diagnosis not present

## 2020-05-06 DIAGNOSIS — I10 Essential (primary) hypertension: Secondary | ICD-10-CM | POA: Diagnosis not present

## 2020-05-06 DIAGNOSIS — R1312 Dysphagia, oropharyngeal phase: Secondary | ICD-10-CM | POA: Diagnosis not present

## 2020-05-06 DIAGNOSIS — M6281 Muscle weakness (generalized): Secondary | ICD-10-CM | POA: Diagnosis not present

## 2020-05-06 DIAGNOSIS — K219 Gastro-esophageal reflux disease without esophagitis: Secondary | ICD-10-CM | POA: Diagnosis not present

## 2020-05-06 DIAGNOSIS — M455 Ankylosing spondylitis of thoracolumbar region: Secondary | ICD-10-CM | POA: Diagnosis not present

## 2020-05-07 DIAGNOSIS — M455 Ankylosing spondylitis of thoracolumbar region: Secondary | ICD-10-CM | POA: Diagnosis not present

## 2020-05-07 DIAGNOSIS — R1312 Dysphagia, oropharyngeal phase: Secondary | ICD-10-CM | POA: Diagnosis not present

## 2020-05-07 DIAGNOSIS — S638X2D Sprain of other part of left wrist and hand, subsequent encounter: Secondary | ICD-10-CM | POA: Diagnosis not present

## 2020-05-07 DIAGNOSIS — M6281 Muscle weakness (generalized): Secondary | ICD-10-CM | POA: Diagnosis not present

## 2020-05-07 DIAGNOSIS — I10 Essential (primary) hypertension: Secondary | ICD-10-CM | POA: Diagnosis not present

## 2020-05-07 DIAGNOSIS — K219 Gastro-esophageal reflux disease without esophagitis: Secondary | ICD-10-CM | POA: Diagnosis not present

## 2020-05-08 DIAGNOSIS — M455 Ankylosing spondylitis of thoracolumbar region: Secondary | ICD-10-CM | POA: Diagnosis not present

## 2020-05-08 DIAGNOSIS — S638X2D Sprain of other part of left wrist and hand, subsequent encounter: Secondary | ICD-10-CM | POA: Diagnosis not present

## 2020-05-08 DIAGNOSIS — Z20828 Contact with and (suspected) exposure to other viral communicable diseases: Secondary | ICD-10-CM | POA: Diagnosis not present

## 2020-05-08 DIAGNOSIS — M6281 Muscle weakness (generalized): Secondary | ICD-10-CM | POA: Diagnosis not present

## 2020-05-08 DIAGNOSIS — I10 Essential (primary) hypertension: Secondary | ICD-10-CM | POA: Diagnosis not present

## 2020-05-08 DIAGNOSIS — K219 Gastro-esophageal reflux disease without esophagitis: Secondary | ICD-10-CM | POA: Diagnosis not present

## 2020-05-08 DIAGNOSIS — R1312 Dysphagia, oropharyngeal phase: Secondary | ICD-10-CM | POA: Diagnosis not present

## 2020-05-09 DIAGNOSIS — R1312 Dysphagia, oropharyngeal phase: Secondary | ICD-10-CM | POA: Diagnosis not present

## 2020-05-09 DIAGNOSIS — K219 Gastro-esophageal reflux disease without esophagitis: Secondary | ICD-10-CM | POA: Diagnosis not present

## 2020-05-09 DIAGNOSIS — M6281 Muscle weakness (generalized): Secondary | ICD-10-CM | POA: Diagnosis not present

## 2020-05-09 DIAGNOSIS — S638X2D Sprain of other part of left wrist and hand, subsequent encounter: Secondary | ICD-10-CM | POA: Diagnosis not present

## 2020-05-09 DIAGNOSIS — M455 Ankylosing spondylitis of thoracolumbar region: Secondary | ICD-10-CM | POA: Diagnosis not present

## 2020-05-09 DIAGNOSIS — I10 Essential (primary) hypertension: Secondary | ICD-10-CM | POA: Diagnosis not present

## 2020-05-10 DIAGNOSIS — M6281 Muscle weakness (generalized): Secondary | ICD-10-CM | POA: Diagnosis not present

## 2020-05-10 DIAGNOSIS — M455 Ankylosing spondylitis of thoracolumbar region: Secondary | ICD-10-CM | POA: Diagnosis not present

## 2020-05-10 DIAGNOSIS — R1312 Dysphagia, oropharyngeal phase: Secondary | ICD-10-CM | POA: Diagnosis not present

## 2020-05-10 DIAGNOSIS — I10 Essential (primary) hypertension: Secondary | ICD-10-CM | POA: Diagnosis not present

## 2020-05-10 DIAGNOSIS — K219 Gastro-esophageal reflux disease without esophagitis: Secondary | ICD-10-CM | POA: Diagnosis not present

## 2020-05-10 DIAGNOSIS — S638X2D Sprain of other part of left wrist and hand, subsequent encounter: Secondary | ICD-10-CM | POA: Diagnosis not present

## 2020-05-13 DIAGNOSIS — R1312 Dysphagia, oropharyngeal phase: Secondary | ICD-10-CM | POA: Diagnosis not present

## 2020-05-13 DIAGNOSIS — M6281 Muscle weakness (generalized): Secondary | ICD-10-CM | POA: Diagnosis not present

## 2020-05-13 DIAGNOSIS — K219 Gastro-esophageal reflux disease without esophagitis: Secondary | ICD-10-CM | POA: Diagnosis not present

## 2020-05-13 DIAGNOSIS — S638X2D Sprain of other part of left wrist and hand, subsequent encounter: Secondary | ICD-10-CM | POA: Diagnosis not present

## 2020-05-13 DIAGNOSIS — I10 Essential (primary) hypertension: Secondary | ICD-10-CM | POA: Diagnosis not present

## 2020-05-13 DIAGNOSIS — M455 Ankylosing spondylitis of thoracolumbar region: Secondary | ICD-10-CM | POA: Diagnosis not present

## 2020-05-14 DIAGNOSIS — M455 Ankylosing spondylitis of thoracolumbar region: Secondary | ICD-10-CM | POA: Diagnosis not present

## 2020-05-14 DIAGNOSIS — Z03818 Encounter for observation for suspected exposure to other biological agents ruled out: Secondary | ICD-10-CM | POA: Diagnosis not present

## 2020-05-14 DIAGNOSIS — I10 Essential (primary) hypertension: Secondary | ICD-10-CM | POA: Diagnosis not present

## 2020-05-14 DIAGNOSIS — R1312 Dysphagia, oropharyngeal phase: Secondary | ICD-10-CM | POA: Diagnosis not present

## 2020-05-14 DIAGNOSIS — S638X2D Sprain of other part of left wrist and hand, subsequent encounter: Secondary | ICD-10-CM | POA: Diagnosis not present

## 2020-05-14 DIAGNOSIS — K219 Gastro-esophageal reflux disease without esophagitis: Secondary | ICD-10-CM | POA: Diagnosis not present

## 2020-05-14 DIAGNOSIS — M6281 Muscle weakness (generalized): Secondary | ICD-10-CM | POA: Diagnosis not present

## 2020-05-15 DIAGNOSIS — M6281 Muscle weakness (generalized): Secondary | ICD-10-CM | POA: Diagnosis not present

## 2020-05-15 DIAGNOSIS — S638X2D Sprain of other part of left wrist and hand, subsequent encounter: Secondary | ICD-10-CM | POA: Diagnosis not present

## 2020-05-15 DIAGNOSIS — R1312 Dysphagia, oropharyngeal phase: Secondary | ICD-10-CM | POA: Diagnosis not present

## 2020-05-15 DIAGNOSIS — K219 Gastro-esophageal reflux disease without esophagitis: Secondary | ICD-10-CM | POA: Diagnosis not present

## 2020-05-15 DIAGNOSIS — I10 Essential (primary) hypertension: Secondary | ICD-10-CM | POA: Diagnosis not present

## 2020-05-15 DIAGNOSIS — M455 Ankylosing spondylitis of thoracolumbar region: Secondary | ICD-10-CM | POA: Diagnosis not present

## 2020-05-16 DIAGNOSIS — S638X2D Sprain of other part of left wrist and hand, subsequent encounter: Secondary | ICD-10-CM | POA: Diagnosis not present

## 2020-05-16 DIAGNOSIS — M6281 Muscle weakness (generalized): Secondary | ICD-10-CM | POA: Diagnosis not present

## 2020-05-16 DIAGNOSIS — I10 Essential (primary) hypertension: Secondary | ICD-10-CM | POA: Diagnosis not present

## 2020-05-16 DIAGNOSIS — K219 Gastro-esophageal reflux disease without esophagitis: Secondary | ICD-10-CM | POA: Diagnosis not present

## 2020-05-16 DIAGNOSIS — R1312 Dysphagia, oropharyngeal phase: Secondary | ICD-10-CM | POA: Diagnosis not present

## 2020-05-16 DIAGNOSIS — M455 Ankylosing spondylitis of thoracolumbar region: Secondary | ICD-10-CM | POA: Diagnosis not present

## 2020-05-17 DIAGNOSIS — K219 Gastro-esophageal reflux disease without esophagitis: Secondary | ICD-10-CM | POA: Diagnosis not present

## 2020-05-17 DIAGNOSIS — M455 Ankylosing spondylitis of thoracolumbar region: Secondary | ICD-10-CM | POA: Diagnosis not present

## 2020-05-17 DIAGNOSIS — I10 Essential (primary) hypertension: Secondary | ICD-10-CM | POA: Diagnosis not present

## 2020-05-17 DIAGNOSIS — M6281 Muscle weakness (generalized): Secondary | ICD-10-CM | POA: Diagnosis not present

## 2020-05-17 DIAGNOSIS — R1312 Dysphagia, oropharyngeal phase: Secondary | ICD-10-CM | POA: Diagnosis not present

## 2020-05-17 DIAGNOSIS — S638X2D Sprain of other part of left wrist and hand, subsequent encounter: Secondary | ICD-10-CM | POA: Diagnosis not present

## 2020-05-20 DIAGNOSIS — M6281 Muscle weakness (generalized): Secondary | ICD-10-CM | POA: Diagnosis not present

## 2020-05-20 DIAGNOSIS — M455 Ankylosing spondylitis of thoracolumbar region: Secondary | ICD-10-CM | POA: Diagnosis not present

## 2020-05-20 DIAGNOSIS — I10 Essential (primary) hypertension: Secondary | ICD-10-CM | POA: Diagnosis not present

## 2020-05-20 DIAGNOSIS — K219 Gastro-esophageal reflux disease without esophagitis: Secondary | ICD-10-CM | POA: Diagnosis not present

## 2020-05-20 DIAGNOSIS — S638X2D Sprain of other part of left wrist and hand, subsequent encounter: Secondary | ICD-10-CM | POA: Diagnosis not present

## 2020-05-20 DIAGNOSIS — R1312 Dysphagia, oropharyngeal phase: Secondary | ICD-10-CM | POA: Diagnosis not present

## 2020-05-21 DIAGNOSIS — K219 Gastro-esophageal reflux disease without esophagitis: Secondary | ICD-10-CM | POA: Diagnosis not present

## 2020-05-21 DIAGNOSIS — I10 Essential (primary) hypertension: Secondary | ICD-10-CM | POA: Diagnosis not present

## 2020-05-21 DIAGNOSIS — S638X2D Sprain of other part of left wrist and hand, subsequent encounter: Secondary | ICD-10-CM | POA: Diagnosis not present

## 2020-05-21 DIAGNOSIS — M455 Ankylosing spondylitis of thoracolumbar region: Secondary | ICD-10-CM | POA: Diagnosis not present

## 2020-05-21 DIAGNOSIS — R1312 Dysphagia, oropharyngeal phase: Secondary | ICD-10-CM | POA: Diagnosis not present

## 2020-05-21 DIAGNOSIS — M6281 Muscle weakness (generalized): Secondary | ICD-10-CM | POA: Diagnosis not present

## 2020-05-22 DIAGNOSIS — M455 Ankylosing spondylitis of thoracolumbar region: Secondary | ICD-10-CM | POA: Diagnosis not present

## 2020-05-22 DIAGNOSIS — S638X2D Sprain of other part of left wrist and hand, subsequent encounter: Secondary | ICD-10-CM | POA: Diagnosis not present

## 2020-05-22 DIAGNOSIS — K219 Gastro-esophageal reflux disease without esophagitis: Secondary | ICD-10-CM | POA: Diagnosis not present

## 2020-05-22 DIAGNOSIS — R1312 Dysphagia, oropharyngeal phase: Secondary | ICD-10-CM | POA: Diagnosis not present

## 2020-05-22 DIAGNOSIS — M6281 Muscle weakness (generalized): Secondary | ICD-10-CM | POA: Diagnosis not present

## 2020-05-22 DIAGNOSIS — I10 Essential (primary) hypertension: Secondary | ICD-10-CM | POA: Diagnosis not present

## 2020-05-23 DIAGNOSIS — R1312 Dysphagia, oropharyngeal phase: Secondary | ICD-10-CM | POA: Diagnosis not present

## 2020-05-23 DIAGNOSIS — I10 Essential (primary) hypertension: Secondary | ICD-10-CM | POA: Diagnosis not present

## 2020-05-23 DIAGNOSIS — K219 Gastro-esophageal reflux disease without esophagitis: Secondary | ICD-10-CM | POA: Diagnosis not present

## 2020-05-23 DIAGNOSIS — M6281 Muscle weakness (generalized): Secondary | ICD-10-CM | POA: Diagnosis not present

## 2020-05-23 DIAGNOSIS — M455 Ankylosing spondylitis of thoracolumbar region: Secondary | ICD-10-CM | POA: Diagnosis not present

## 2020-05-23 DIAGNOSIS — S638X2D Sprain of other part of left wrist and hand, subsequent encounter: Secondary | ICD-10-CM | POA: Diagnosis not present

## 2020-05-24 DIAGNOSIS — M6281 Muscle weakness (generalized): Secondary | ICD-10-CM | POA: Diagnosis not present

## 2020-05-24 DIAGNOSIS — I10 Essential (primary) hypertension: Secondary | ICD-10-CM | POA: Diagnosis not present

## 2020-05-24 DIAGNOSIS — R1312 Dysphagia, oropharyngeal phase: Secondary | ICD-10-CM | POA: Diagnosis not present

## 2020-05-24 DIAGNOSIS — S638X2D Sprain of other part of left wrist and hand, subsequent encounter: Secondary | ICD-10-CM | POA: Diagnosis not present

## 2020-05-24 DIAGNOSIS — M455 Ankylosing spondylitis of thoracolumbar region: Secondary | ICD-10-CM | POA: Diagnosis not present

## 2020-05-24 DIAGNOSIS — K219 Gastro-esophageal reflux disease without esophagitis: Secondary | ICD-10-CM | POA: Diagnosis not present

## 2020-05-27 DIAGNOSIS — K219 Gastro-esophageal reflux disease without esophagitis: Secondary | ICD-10-CM | POA: Diagnosis not present

## 2020-05-27 DIAGNOSIS — I10 Essential (primary) hypertension: Secondary | ICD-10-CM | POA: Diagnosis not present

## 2020-05-27 DIAGNOSIS — M6281 Muscle weakness (generalized): Secondary | ICD-10-CM | POA: Diagnosis not present

## 2020-05-27 DIAGNOSIS — S638X2D Sprain of other part of left wrist and hand, subsequent encounter: Secondary | ICD-10-CM | POA: Diagnosis not present

## 2020-05-27 DIAGNOSIS — R1312 Dysphagia, oropharyngeal phase: Secondary | ICD-10-CM | POA: Diagnosis not present

## 2020-05-27 DIAGNOSIS — M455 Ankylosing spondylitis of thoracolumbar region: Secondary | ICD-10-CM | POA: Diagnosis not present

## 2020-05-28 DIAGNOSIS — S638X2D Sprain of other part of left wrist and hand, subsequent encounter: Secondary | ICD-10-CM | POA: Diagnosis not present

## 2020-05-28 DIAGNOSIS — M6281 Muscle weakness (generalized): Secondary | ICD-10-CM | POA: Diagnosis not present

## 2020-05-28 DIAGNOSIS — M455 Ankylosing spondylitis of thoracolumbar region: Secondary | ICD-10-CM | POA: Diagnosis not present

## 2020-05-28 DIAGNOSIS — R1312 Dysphagia, oropharyngeal phase: Secondary | ICD-10-CM | POA: Diagnosis not present

## 2020-05-28 DIAGNOSIS — I10 Essential (primary) hypertension: Secondary | ICD-10-CM | POA: Diagnosis not present

## 2020-05-28 DIAGNOSIS — K219 Gastro-esophageal reflux disease without esophagitis: Secondary | ICD-10-CM | POA: Diagnosis not present

## 2020-05-29 DIAGNOSIS — I10 Essential (primary) hypertension: Secondary | ICD-10-CM | POA: Diagnosis not present

## 2020-05-29 DIAGNOSIS — S638X2D Sprain of other part of left wrist and hand, subsequent encounter: Secondary | ICD-10-CM | POA: Diagnosis not present

## 2020-05-29 DIAGNOSIS — K219 Gastro-esophageal reflux disease without esophagitis: Secondary | ICD-10-CM | POA: Diagnosis not present

## 2020-05-29 DIAGNOSIS — M455 Ankylosing spondylitis of thoracolumbar region: Secondary | ICD-10-CM | POA: Diagnosis not present

## 2020-05-29 DIAGNOSIS — M6281 Muscle weakness (generalized): Secondary | ICD-10-CM | POA: Diagnosis not present

## 2020-05-29 DIAGNOSIS — R1312 Dysphagia, oropharyngeal phase: Secondary | ICD-10-CM | POA: Diagnosis not present

## 2020-05-30 DIAGNOSIS — I5032 Chronic diastolic (congestive) heart failure: Secondary | ICD-10-CM | POA: Diagnosis not present

## 2020-05-30 DIAGNOSIS — R2681 Unsteadiness on feet: Secondary | ICD-10-CM | POA: Diagnosis not present

## 2020-05-30 DIAGNOSIS — K219 Gastro-esophageal reflux disease without esophagitis: Secondary | ICD-10-CM | POA: Diagnosis not present

## 2020-05-30 DIAGNOSIS — R1312 Dysphagia, oropharyngeal phase: Secondary | ICD-10-CM | POA: Diagnosis not present

## 2020-05-30 DIAGNOSIS — M6281 Muscle weakness (generalized): Secondary | ICD-10-CM | POA: Diagnosis not present

## 2020-05-30 DIAGNOSIS — I10 Essential (primary) hypertension: Secondary | ICD-10-CM | POA: Diagnosis not present

## 2020-05-30 DIAGNOSIS — R2689 Other abnormalities of gait and mobility: Secondary | ICD-10-CM | POA: Diagnosis not present

## 2020-05-30 DIAGNOSIS — S638X2D Sprain of other part of left wrist and hand, subsequent encounter: Secondary | ICD-10-CM | POA: Diagnosis not present

## 2020-05-30 DIAGNOSIS — M455 Ankylosing spondylitis of thoracolumbar region: Secondary | ICD-10-CM | POA: Diagnosis not present

## 2020-06-26 DIAGNOSIS — Z03818 Encounter for observation for suspected exposure to other biological agents ruled out: Secondary | ICD-10-CM | POA: Diagnosis not present

## 2020-06-28 DIAGNOSIS — I5032 Chronic diastolic (congestive) heart failure: Secondary | ICD-10-CM | POA: Diagnosis not present

## 2020-06-28 DIAGNOSIS — R1312 Dysphagia, oropharyngeal phase: Secondary | ICD-10-CM | POA: Diagnosis not present

## 2020-06-28 DIAGNOSIS — K219 Gastro-esophageal reflux disease without esophagitis: Secondary | ICD-10-CM | POA: Diagnosis not present

## 2020-06-28 DIAGNOSIS — M455 Ankylosing spondylitis of thoracolumbar region: Secondary | ICD-10-CM | POA: Diagnosis not present

## 2020-06-28 DIAGNOSIS — S638X2D Sprain of other part of left wrist and hand, subsequent encounter: Secondary | ICD-10-CM | POA: Diagnosis not present

## 2020-06-28 DIAGNOSIS — I10 Essential (primary) hypertension: Secondary | ICD-10-CM | POA: Diagnosis not present

## 2020-07-01 DIAGNOSIS — R1312 Dysphagia, oropharyngeal phase: Secondary | ICD-10-CM | POA: Diagnosis not present

## 2020-07-01 DIAGNOSIS — K219 Gastro-esophageal reflux disease without esophagitis: Secondary | ICD-10-CM | POA: Diagnosis not present

## 2020-07-01 DIAGNOSIS — M455 Ankylosing spondylitis of thoracolumbar region: Secondary | ICD-10-CM | POA: Diagnosis not present

## 2020-07-01 DIAGNOSIS — I10 Essential (primary) hypertension: Secondary | ICD-10-CM | POA: Diagnosis not present

## 2020-07-01 DIAGNOSIS — R2681 Unsteadiness on feet: Secondary | ICD-10-CM | POA: Diagnosis not present

## 2020-07-01 DIAGNOSIS — Z03818 Encounter for observation for suspected exposure to other biological agents ruled out: Secondary | ICD-10-CM | POA: Diagnosis not present

## 2020-07-01 DIAGNOSIS — I5032 Chronic diastolic (congestive) heart failure: Secondary | ICD-10-CM | POA: Diagnosis not present

## 2020-07-01 DIAGNOSIS — M6281 Muscle weakness (generalized): Secondary | ICD-10-CM | POA: Diagnosis not present

## 2020-07-01 DIAGNOSIS — R2689 Other abnormalities of gait and mobility: Secondary | ICD-10-CM | POA: Diagnosis not present

## 2020-07-02 DIAGNOSIS — I10 Essential (primary) hypertension: Secondary | ICD-10-CM | POA: Diagnosis not present

## 2020-07-02 DIAGNOSIS — R1312 Dysphagia, oropharyngeal phase: Secondary | ICD-10-CM | POA: Diagnosis not present

## 2020-07-02 DIAGNOSIS — M6281 Muscle weakness (generalized): Secondary | ICD-10-CM | POA: Diagnosis not present

## 2020-07-02 DIAGNOSIS — M455 Ankylosing spondylitis of thoracolumbar region: Secondary | ICD-10-CM | POA: Diagnosis not present

## 2020-07-02 DIAGNOSIS — I5032 Chronic diastolic (congestive) heart failure: Secondary | ICD-10-CM | POA: Diagnosis not present

## 2020-07-02 DIAGNOSIS — K219 Gastro-esophageal reflux disease without esophagitis: Secondary | ICD-10-CM | POA: Diagnosis not present

## 2020-07-03 DIAGNOSIS — I10 Essential (primary) hypertension: Secondary | ICD-10-CM | POA: Diagnosis not present

## 2020-07-03 DIAGNOSIS — K219 Gastro-esophageal reflux disease without esophagitis: Secondary | ICD-10-CM | POA: Diagnosis not present

## 2020-07-03 DIAGNOSIS — R1312 Dysphagia, oropharyngeal phase: Secondary | ICD-10-CM | POA: Diagnosis not present

## 2020-07-03 DIAGNOSIS — M6281 Muscle weakness (generalized): Secondary | ICD-10-CM | POA: Diagnosis not present

## 2020-07-03 DIAGNOSIS — M455 Ankylosing spondylitis of thoracolumbar region: Secondary | ICD-10-CM | POA: Diagnosis not present

## 2020-07-03 DIAGNOSIS — I5032 Chronic diastolic (congestive) heart failure: Secondary | ICD-10-CM | POA: Diagnosis not present

## 2020-07-04 DIAGNOSIS — Z20822 Contact with and (suspected) exposure to covid-19: Secondary | ICD-10-CM | POA: Diagnosis not present

## 2020-07-04 DIAGNOSIS — K219 Gastro-esophageal reflux disease without esophagitis: Secondary | ICD-10-CM | POA: Diagnosis not present

## 2020-07-04 DIAGNOSIS — M455 Ankylosing spondylitis of thoracolumbar region: Secondary | ICD-10-CM | POA: Diagnosis not present

## 2020-07-04 DIAGNOSIS — M6281 Muscle weakness (generalized): Secondary | ICD-10-CM | POA: Diagnosis not present

## 2020-07-04 DIAGNOSIS — I5032 Chronic diastolic (congestive) heart failure: Secondary | ICD-10-CM | POA: Diagnosis not present

## 2020-07-04 DIAGNOSIS — I10 Essential (primary) hypertension: Secondary | ICD-10-CM | POA: Diagnosis not present

## 2020-07-04 DIAGNOSIS — R1312 Dysphagia, oropharyngeal phase: Secondary | ICD-10-CM | POA: Diagnosis not present

## 2020-07-05 DIAGNOSIS — I5032 Chronic diastolic (congestive) heart failure: Secondary | ICD-10-CM | POA: Diagnosis not present

## 2020-07-05 DIAGNOSIS — R1312 Dysphagia, oropharyngeal phase: Secondary | ICD-10-CM | POA: Diagnosis not present

## 2020-07-05 DIAGNOSIS — K219 Gastro-esophageal reflux disease without esophagitis: Secondary | ICD-10-CM | POA: Diagnosis not present

## 2020-07-05 DIAGNOSIS — M455 Ankylosing spondylitis of thoracolumbar region: Secondary | ICD-10-CM | POA: Diagnosis not present

## 2020-07-05 DIAGNOSIS — M6281 Muscle weakness (generalized): Secondary | ICD-10-CM | POA: Diagnosis not present

## 2020-07-05 DIAGNOSIS — I10 Essential (primary) hypertension: Secondary | ICD-10-CM | POA: Diagnosis not present

## 2020-07-06 DIAGNOSIS — I5032 Chronic diastolic (congestive) heart failure: Secondary | ICD-10-CM | POA: Diagnosis not present

## 2020-07-06 DIAGNOSIS — M6281 Muscle weakness (generalized): Secondary | ICD-10-CM | POA: Diagnosis not present

## 2020-07-06 DIAGNOSIS — M455 Ankylosing spondylitis of thoracolumbar region: Secondary | ICD-10-CM | POA: Diagnosis not present

## 2020-07-06 DIAGNOSIS — I10 Essential (primary) hypertension: Secondary | ICD-10-CM | POA: Diagnosis not present

## 2020-07-06 DIAGNOSIS — K219 Gastro-esophageal reflux disease without esophagitis: Secondary | ICD-10-CM | POA: Diagnosis not present

## 2020-07-06 DIAGNOSIS — R1312 Dysphagia, oropharyngeal phase: Secondary | ICD-10-CM | POA: Diagnosis not present

## 2020-07-07 DIAGNOSIS — R1312 Dysphagia, oropharyngeal phase: Secondary | ICD-10-CM | POA: Diagnosis not present

## 2020-07-07 DIAGNOSIS — I5032 Chronic diastolic (congestive) heart failure: Secondary | ICD-10-CM | POA: Diagnosis not present

## 2020-07-07 DIAGNOSIS — K219 Gastro-esophageal reflux disease without esophagitis: Secondary | ICD-10-CM | POA: Diagnosis not present

## 2020-07-07 DIAGNOSIS — M455 Ankylosing spondylitis of thoracolumbar region: Secondary | ICD-10-CM | POA: Diagnosis not present

## 2020-07-07 DIAGNOSIS — M6281 Muscle weakness (generalized): Secondary | ICD-10-CM | POA: Diagnosis not present

## 2020-07-07 DIAGNOSIS — I10 Essential (primary) hypertension: Secondary | ICD-10-CM | POA: Diagnosis not present

## 2020-07-08 DIAGNOSIS — M455 Ankylosing spondylitis of thoracolumbar region: Secondary | ICD-10-CM | POA: Diagnosis not present

## 2020-07-08 DIAGNOSIS — I5032 Chronic diastolic (congestive) heart failure: Secondary | ICD-10-CM | POA: Diagnosis not present

## 2020-07-08 DIAGNOSIS — M6281 Muscle weakness (generalized): Secondary | ICD-10-CM | POA: Diagnosis not present

## 2020-07-08 DIAGNOSIS — Z20822 Contact with and (suspected) exposure to covid-19: Secondary | ICD-10-CM | POA: Diagnosis not present

## 2020-07-08 DIAGNOSIS — I10 Essential (primary) hypertension: Secondary | ICD-10-CM | POA: Diagnosis not present

## 2020-07-08 DIAGNOSIS — K219 Gastro-esophageal reflux disease without esophagitis: Secondary | ICD-10-CM | POA: Diagnosis not present

## 2020-07-08 DIAGNOSIS — R1312 Dysphagia, oropharyngeal phase: Secondary | ICD-10-CM | POA: Diagnosis not present

## 2020-07-09 DIAGNOSIS — R1312 Dysphagia, oropharyngeal phase: Secondary | ICD-10-CM | POA: Diagnosis not present

## 2020-07-09 DIAGNOSIS — I10 Essential (primary) hypertension: Secondary | ICD-10-CM | POA: Diagnosis not present

## 2020-07-09 DIAGNOSIS — M6281 Muscle weakness (generalized): Secondary | ICD-10-CM | POA: Diagnosis not present

## 2020-07-09 DIAGNOSIS — K219 Gastro-esophageal reflux disease without esophagitis: Secondary | ICD-10-CM | POA: Diagnosis not present

## 2020-07-09 DIAGNOSIS — I5032 Chronic diastolic (congestive) heart failure: Secondary | ICD-10-CM | POA: Diagnosis not present

## 2020-07-09 DIAGNOSIS — M455 Ankylosing spondylitis of thoracolumbar region: Secondary | ICD-10-CM | POA: Diagnosis not present

## 2020-07-11 DIAGNOSIS — Z20822 Contact with and (suspected) exposure to covid-19: Secondary | ICD-10-CM | POA: Diagnosis not present

## 2020-07-12 DIAGNOSIS — I5032 Chronic diastolic (congestive) heart failure: Secondary | ICD-10-CM | POA: Diagnosis not present

## 2020-07-12 DIAGNOSIS — K219 Gastro-esophageal reflux disease without esophagitis: Secondary | ICD-10-CM | POA: Diagnosis not present

## 2020-07-12 DIAGNOSIS — H3530 Unspecified macular degeneration: Secondary | ICD-10-CM | POA: Diagnosis not present

## 2020-07-12 DIAGNOSIS — R1312 Dysphagia, oropharyngeal phase: Secondary | ICD-10-CM | POA: Diagnosis not present

## 2020-07-12 DIAGNOSIS — M810 Age-related osteoporosis without current pathological fracture: Secondary | ICD-10-CM | POA: Diagnosis not present

## 2020-07-12 DIAGNOSIS — I10 Essential (primary) hypertension: Secondary | ICD-10-CM | POA: Diagnosis not present

## 2020-07-12 DIAGNOSIS — M545 Low back pain: Secondary | ICD-10-CM | POA: Diagnosis not present

## 2020-07-12 DIAGNOSIS — M455 Ankylosing spondylitis of thoracolumbar region: Secondary | ICD-10-CM | POA: Diagnosis not present

## 2020-07-12 DIAGNOSIS — D6489 Other specified anemias: Secondary | ICD-10-CM | POA: Diagnosis not present

## 2020-07-12 DIAGNOSIS — M6281 Muscle weakness (generalized): Secondary | ICD-10-CM | POA: Diagnosis not present

## 2020-07-15 DIAGNOSIS — M6281 Muscle weakness (generalized): Secondary | ICD-10-CM | POA: Diagnosis not present

## 2020-07-15 DIAGNOSIS — M455 Ankylosing spondylitis of thoracolumbar region: Secondary | ICD-10-CM | POA: Diagnosis not present

## 2020-07-15 DIAGNOSIS — R1312 Dysphagia, oropharyngeal phase: Secondary | ICD-10-CM | POA: Diagnosis not present

## 2020-07-15 DIAGNOSIS — Z20822 Contact with and (suspected) exposure to covid-19: Secondary | ICD-10-CM | POA: Diagnosis not present

## 2020-07-15 DIAGNOSIS — K219 Gastro-esophageal reflux disease without esophagitis: Secondary | ICD-10-CM | POA: Diagnosis not present

## 2020-07-15 DIAGNOSIS — I5032 Chronic diastolic (congestive) heart failure: Secondary | ICD-10-CM | POA: Diagnosis not present

## 2020-07-15 DIAGNOSIS — I10 Essential (primary) hypertension: Secondary | ICD-10-CM | POA: Diagnosis not present

## 2020-07-16 DIAGNOSIS — I5032 Chronic diastolic (congestive) heart failure: Secondary | ICD-10-CM | POA: Diagnosis not present

## 2020-07-16 DIAGNOSIS — K219 Gastro-esophageal reflux disease without esophagitis: Secondary | ICD-10-CM | POA: Diagnosis not present

## 2020-07-16 DIAGNOSIS — M455 Ankylosing spondylitis of thoracolumbar region: Secondary | ICD-10-CM | POA: Diagnosis not present

## 2020-07-16 DIAGNOSIS — I10 Essential (primary) hypertension: Secondary | ICD-10-CM | POA: Diagnosis not present

## 2020-07-16 DIAGNOSIS — R1312 Dysphagia, oropharyngeal phase: Secondary | ICD-10-CM | POA: Diagnosis not present

## 2020-07-16 DIAGNOSIS — M6281 Muscle weakness (generalized): Secondary | ICD-10-CM | POA: Diagnosis not present

## 2020-07-17 DIAGNOSIS — I10 Essential (primary) hypertension: Secondary | ICD-10-CM | POA: Diagnosis not present

## 2020-07-17 DIAGNOSIS — R1312 Dysphagia, oropharyngeal phase: Secondary | ICD-10-CM | POA: Diagnosis not present

## 2020-07-17 DIAGNOSIS — M6281 Muscle weakness (generalized): Secondary | ICD-10-CM | POA: Diagnosis not present

## 2020-07-17 DIAGNOSIS — K219 Gastro-esophageal reflux disease without esophagitis: Secondary | ICD-10-CM | POA: Diagnosis not present

## 2020-07-17 DIAGNOSIS — M455 Ankylosing spondylitis of thoracolumbar region: Secondary | ICD-10-CM | POA: Diagnosis not present

## 2020-07-17 DIAGNOSIS — I5032 Chronic diastolic (congestive) heart failure: Secondary | ICD-10-CM | POA: Diagnosis not present

## 2020-07-18 DIAGNOSIS — I5032 Chronic diastolic (congestive) heart failure: Secondary | ICD-10-CM | POA: Diagnosis not present

## 2020-07-18 DIAGNOSIS — I10 Essential (primary) hypertension: Secondary | ICD-10-CM | POA: Diagnosis not present

## 2020-07-18 DIAGNOSIS — M455 Ankylosing spondylitis of thoracolumbar region: Secondary | ICD-10-CM | POA: Diagnosis not present

## 2020-07-18 DIAGNOSIS — M6281 Muscle weakness (generalized): Secondary | ICD-10-CM | POA: Diagnosis not present

## 2020-07-18 DIAGNOSIS — R1312 Dysphagia, oropharyngeal phase: Secondary | ICD-10-CM | POA: Diagnosis not present

## 2020-07-18 DIAGNOSIS — K219 Gastro-esophageal reflux disease without esophagitis: Secondary | ICD-10-CM | POA: Diagnosis not present

## 2020-07-19 DIAGNOSIS — I5032 Chronic diastolic (congestive) heart failure: Secondary | ICD-10-CM | POA: Diagnosis not present

## 2020-07-19 DIAGNOSIS — K219 Gastro-esophageal reflux disease without esophagitis: Secondary | ICD-10-CM | POA: Diagnosis not present

## 2020-07-19 DIAGNOSIS — R1312 Dysphagia, oropharyngeal phase: Secondary | ICD-10-CM | POA: Diagnosis not present

## 2020-07-19 DIAGNOSIS — M455 Ankylosing spondylitis of thoracolumbar region: Secondary | ICD-10-CM | POA: Diagnosis not present

## 2020-07-19 DIAGNOSIS — I10 Essential (primary) hypertension: Secondary | ICD-10-CM | POA: Diagnosis not present

## 2020-07-19 DIAGNOSIS — M6281 Muscle weakness (generalized): Secondary | ICD-10-CM | POA: Diagnosis not present

## 2020-07-22 DIAGNOSIS — I5032 Chronic diastolic (congestive) heart failure: Secondary | ICD-10-CM | POA: Diagnosis not present

## 2020-07-22 DIAGNOSIS — M6281 Muscle weakness (generalized): Secondary | ICD-10-CM | POA: Diagnosis not present

## 2020-07-22 DIAGNOSIS — K219 Gastro-esophageal reflux disease without esophagitis: Secondary | ICD-10-CM | POA: Diagnosis not present

## 2020-07-22 DIAGNOSIS — I10 Essential (primary) hypertension: Secondary | ICD-10-CM | POA: Diagnosis not present

## 2020-07-22 DIAGNOSIS — R1312 Dysphagia, oropharyngeal phase: Secondary | ICD-10-CM | POA: Diagnosis not present

## 2020-07-22 DIAGNOSIS — M455 Ankylosing spondylitis of thoracolumbar region: Secondary | ICD-10-CM | POA: Diagnosis not present

## 2020-07-23 DIAGNOSIS — M6281 Muscle weakness (generalized): Secondary | ICD-10-CM | POA: Diagnosis not present

## 2020-07-23 DIAGNOSIS — K219 Gastro-esophageal reflux disease without esophagitis: Secondary | ICD-10-CM | POA: Diagnosis not present

## 2020-07-23 DIAGNOSIS — I10 Essential (primary) hypertension: Secondary | ICD-10-CM | POA: Diagnosis not present

## 2020-07-23 DIAGNOSIS — I5032 Chronic diastolic (congestive) heart failure: Secondary | ICD-10-CM | POA: Diagnosis not present

## 2020-07-23 DIAGNOSIS — R1312 Dysphagia, oropharyngeal phase: Secondary | ICD-10-CM | POA: Diagnosis not present

## 2020-07-23 DIAGNOSIS — Z20828 Contact with and (suspected) exposure to other viral communicable diseases: Secondary | ICD-10-CM | POA: Diagnosis not present

## 2020-07-23 DIAGNOSIS — F4320 Adjustment disorder, unspecified: Secondary | ICD-10-CM | POA: Diagnosis not present

## 2020-07-23 DIAGNOSIS — M455 Ankylosing spondylitis of thoracolumbar region: Secondary | ICD-10-CM | POA: Diagnosis not present

## 2020-07-24 DIAGNOSIS — I10 Essential (primary) hypertension: Secondary | ICD-10-CM | POA: Diagnosis not present

## 2020-07-24 DIAGNOSIS — R1312 Dysphagia, oropharyngeal phase: Secondary | ICD-10-CM | POA: Diagnosis not present

## 2020-07-24 DIAGNOSIS — M455 Ankylosing spondylitis of thoracolumbar region: Secondary | ICD-10-CM | POA: Diagnosis not present

## 2020-07-24 DIAGNOSIS — I5032 Chronic diastolic (congestive) heart failure: Secondary | ICD-10-CM | POA: Diagnosis not present

## 2020-07-24 DIAGNOSIS — K219 Gastro-esophageal reflux disease without esophagitis: Secondary | ICD-10-CM | POA: Diagnosis not present

## 2020-07-24 DIAGNOSIS — M6281 Muscle weakness (generalized): Secondary | ICD-10-CM | POA: Diagnosis not present

## 2020-07-25 DIAGNOSIS — K219 Gastro-esophageal reflux disease without esophagitis: Secondary | ICD-10-CM | POA: Diagnosis not present

## 2020-07-25 DIAGNOSIS — R1312 Dysphagia, oropharyngeal phase: Secondary | ICD-10-CM | POA: Diagnosis not present

## 2020-07-25 DIAGNOSIS — M6281 Muscle weakness (generalized): Secondary | ICD-10-CM | POA: Diagnosis not present

## 2020-07-25 DIAGNOSIS — I10 Essential (primary) hypertension: Secondary | ICD-10-CM | POA: Diagnosis not present

## 2020-07-25 DIAGNOSIS — M455 Ankylosing spondylitis of thoracolumbar region: Secondary | ICD-10-CM | POA: Diagnosis not present

## 2020-07-25 DIAGNOSIS — I5032 Chronic diastolic (congestive) heart failure: Secondary | ICD-10-CM | POA: Diagnosis not present

## 2020-07-26 DIAGNOSIS — K219 Gastro-esophageal reflux disease without esophagitis: Secondary | ICD-10-CM | POA: Diagnosis not present

## 2020-07-26 DIAGNOSIS — I5032 Chronic diastolic (congestive) heart failure: Secondary | ICD-10-CM | POA: Diagnosis not present

## 2020-07-26 DIAGNOSIS — M455 Ankylosing spondylitis of thoracolumbar region: Secondary | ICD-10-CM | POA: Diagnosis not present

## 2020-07-26 DIAGNOSIS — R1312 Dysphagia, oropharyngeal phase: Secondary | ICD-10-CM | POA: Diagnosis not present

## 2020-07-26 DIAGNOSIS — Z20828 Contact with and (suspected) exposure to other viral communicable diseases: Secondary | ICD-10-CM | POA: Diagnosis not present

## 2020-07-26 DIAGNOSIS — I10 Essential (primary) hypertension: Secondary | ICD-10-CM | POA: Diagnosis not present

## 2020-07-26 DIAGNOSIS — M6281 Muscle weakness (generalized): Secondary | ICD-10-CM | POA: Diagnosis not present

## 2020-07-28 DIAGNOSIS — I5032 Chronic diastolic (congestive) heart failure: Secondary | ICD-10-CM | POA: Diagnosis not present

## 2020-07-28 DIAGNOSIS — K219 Gastro-esophageal reflux disease without esophagitis: Secondary | ICD-10-CM | POA: Diagnosis not present

## 2020-07-28 DIAGNOSIS — R1312 Dysphagia, oropharyngeal phase: Secondary | ICD-10-CM | POA: Diagnosis not present

## 2020-07-28 DIAGNOSIS — I10 Essential (primary) hypertension: Secondary | ICD-10-CM | POA: Diagnosis not present

## 2020-07-28 DIAGNOSIS — M6281 Muscle weakness (generalized): Secondary | ICD-10-CM | POA: Diagnosis not present

## 2020-07-28 DIAGNOSIS — M455 Ankylosing spondylitis of thoracolumbar region: Secondary | ICD-10-CM | POA: Diagnosis not present

## 2020-07-31 DIAGNOSIS — S638X2D Sprain of other part of left wrist and hand, subsequent encounter: Secondary | ICD-10-CM | POA: Diagnosis not present

## 2020-07-31 DIAGNOSIS — K219 Gastro-esophageal reflux disease without esophagitis: Secondary | ICD-10-CM | POA: Diagnosis not present

## 2020-07-31 DIAGNOSIS — M455 Ankylosing spondylitis of thoracolumbar region: Secondary | ICD-10-CM | POA: Diagnosis not present

## 2020-07-31 DIAGNOSIS — M6281 Muscle weakness (generalized): Secondary | ICD-10-CM | POA: Diagnosis not present

## 2020-07-31 DIAGNOSIS — I5032 Chronic diastolic (congestive) heart failure: Secondary | ICD-10-CM | POA: Diagnosis not present

## 2020-07-31 DIAGNOSIS — I10 Essential (primary) hypertension: Secondary | ICD-10-CM | POA: Diagnosis not present

## 2020-07-31 DIAGNOSIS — R2689 Other abnormalities of gait and mobility: Secondary | ICD-10-CM | POA: Diagnosis not present

## 2020-07-31 DIAGNOSIS — R1312 Dysphagia, oropharyngeal phase: Secondary | ICD-10-CM | POA: Diagnosis not present

## 2020-07-31 DIAGNOSIS — R2681 Unsteadiness on feet: Secondary | ICD-10-CM | POA: Diagnosis not present

## 2020-08-06 DIAGNOSIS — Z20828 Contact with and (suspected) exposure to other viral communicable diseases: Secondary | ICD-10-CM | POA: Diagnosis not present

## 2020-08-07 DIAGNOSIS — B372 Candidiasis of skin and nail: Secondary | ICD-10-CM | POA: Diagnosis not present

## 2020-08-07 DIAGNOSIS — I5032 Chronic diastolic (congestive) heart failure: Secondary | ICD-10-CM | POA: Diagnosis not present

## 2020-08-07 DIAGNOSIS — I10 Essential (primary) hypertension: Secondary | ICD-10-CM | POA: Diagnosis not present

## 2020-08-07 DIAGNOSIS — D6489 Other specified anemias: Secondary | ICD-10-CM | POA: Diagnosis not present

## 2020-08-12 DIAGNOSIS — Z20828 Contact with and (suspected) exposure to other viral communicable diseases: Secondary | ICD-10-CM | POA: Diagnosis not present

## 2020-08-15 DIAGNOSIS — Z20828 Contact with and (suspected) exposure to other viral communicable diseases: Secondary | ICD-10-CM | POA: Diagnosis not present

## 2020-08-19 DIAGNOSIS — Z20828 Contact with and (suspected) exposure to other viral communicable diseases: Secondary | ICD-10-CM | POA: Diagnosis not present

## 2020-08-20 DIAGNOSIS — F4320 Adjustment disorder, unspecified: Secondary | ICD-10-CM | POA: Diagnosis not present

## 2020-08-23 DIAGNOSIS — E559 Vitamin D deficiency, unspecified: Secondary | ICD-10-CM | POA: Diagnosis not present

## 2020-08-23 DIAGNOSIS — E785 Hyperlipidemia, unspecified: Secondary | ICD-10-CM | POA: Diagnosis not present

## 2020-08-23 DIAGNOSIS — E0590 Thyrotoxicosis, unspecified without thyrotoxic crisis or storm: Secondary | ICD-10-CM | POA: Diagnosis not present

## 2020-08-23 DIAGNOSIS — D649 Anemia, unspecified: Secondary | ICD-10-CM | POA: Diagnosis not present

## 2020-08-23 DIAGNOSIS — E119 Type 2 diabetes mellitus without complications: Secondary | ICD-10-CM | POA: Diagnosis not present

## 2020-08-25 DIAGNOSIS — Z20828 Contact with and (suspected) exposure to other viral communicable diseases: Secondary | ICD-10-CM | POA: Diagnosis not present

## 2020-08-26 DIAGNOSIS — D6489 Other specified anemias: Secondary | ICD-10-CM | POA: Diagnosis not present

## 2020-08-26 DIAGNOSIS — I10 Essential (primary) hypertension: Secondary | ICD-10-CM | POA: Diagnosis not present

## 2020-08-26 DIAGNOSIS — M810 Age-related osteoporosis without current pathological fracture: Secondary | ICD-10-CM | POA: Diagnosis not present

## 2020-08-26 DIAGNOSIS — I5032 Chronic diastolic (congestive) heart failure: Secondary | ICD-10-CM | POA: Diagnosis not present

## 2020-08-28 DIAGNOSIS — I5032 Chronic diastolic (congestive) heart failure: Secondary | ICD-10-CM | POA: Diagnosis not present

## 2020-08-28 DIAGNOSIS — S638X2D Sprain of other part of left wrist and hand, subsequent encounter: Secondary | ICD-10-CM | POA: Diagnosis not present

## 2020-08-28 DIAGNOSIS — K219 Gastro-esophageal reflux disease without esophagitis: Secondary | ICD-10-CM | POA: Diagnosis not present

## 2020-08-28 DIAGNOSIS — I10 Essential (primary) hypertension: Secondary | ICD-10-CM | POA: Diagnosis not present

## 2020-08-28 DIAGNOSIS — R1312 Dysphagia, oropharyngeal phase: Secondary | ICD-10-CM | POA: Diagnosis not present

## 2020-08-28 DIAGNOSIS — M455 Ankylosing spondylitis of thoracolumbar region: Secondary | ICD-10-CM | POA: Diagnosis not present

## 2020-08-31 DIAGNOSIS — R2689 Other abnormalities of gait and mobility: Secondary | ICD-10-CM | POA: Diagnosis not present

## 2020-08-31 DIAGNOSIS — R278 Other lack of coordination: Secondary | ICD-10-CM | POA: Diagnosis not present

## 2020-08-31 DIAGNOSIS — I10 Essential (primary) hypertension: Secondary | ICD-10-CM | POA: Diagnosis not present

## 2020-08-31 DIAGNOSIS — K219 Gastro-esophageal reflux disease without esophagitis: Secondary | ICD-10-CM | POA: Diagnosis not present

## 2020-08-31 DIAGNOSIS — M455 Ankylosing spondylitis of thoracolumbar region: Secondary | ICD-10-CM | POA: Diagnosis not present

## 2020-08-31 DIAGNOSIS — R1312 Dysphagia, oropharyngeal phase: Secondary | ICD-10-CM | POA: Diagnosis not present

## 2020-08-31 DIAGNOSIS — S638X2D Sprain of other part of left wrist and hand, subsequent encounter: Secondary | ICD-10-CM | POA: Diagnosis not present

## 2020-08-31 DIAGNOSIS — I5032 Chronic diastolic (congestive) heart failure: Secondary | ICD-10-CM | POA: Diagnosis not present

## 2020-08-31 DIAGNOSIS — R2681 Unsteadiness on feet: Secondary | ICD-10-CM | POA: Diagnosis not present

## 2020-08-31 DIAGNOSIS — M6281 Muscle weakness (generalized): Secondary | ICD-10-CM | POA: Diagnosis not present

## 2020-09-01 DIAGNOSIS — S638X2D Sprain of other part of left wrist and hand, subsequent encounter: Secondary | ICD-10-CM | POA: Diagnosis not present

## 2020-09-01 DIAGNOSIS — M455 Ankylosing spondylitis of thoracolumbar region: Secondary | ICD-10-CM | POA: Diagnosis not present

## 2020-09-01 DIAGNOSIS — I10 Essential (primary) hypertension: Secondary | ICD-10-CM | POA: Diagnosis not present

## 2020-09-01 DIAGNOSIS — I5032 Chronic diastolic (congestive) heart failure: Secondary | ICD-10-CM | POA: Diagnosis not present

## 2020-09-01 DIAGNOSIS — K219 Gastro-esophageal reflux disease without esophagitis: Secondary | ICD-10-CM | POA: Diagnosis not present

## 2020-09-01 DIAGNOSIS — R1312 Dysphagia, oropharyngeal phase: Secondary | ICD-10-CM | POA: Diagnosis not present

## 2020-09-02 DIAGNOSIS — I10 Essential (primary) hypertension: Secondary | ICD-10-CM | POA: Diagnosis not present

## 2020-09-02 DIAGNOSIS — I5032 Chronic diastolic (congestive) heart failure: Secondary | ICD-10-CM | POA: Diagnosis not present

## 2020-09-02 DIAGNOSIS — S638X2D Sprain of other part of left wrist and hand, subsequent encounter: Secondary | ICD-10-CM | POA: Diagnosis not present

## 2020-09-02 DIAGNOSIS — K219 Gastro-esophageal reflux disease without esophagitis: Secondary | ICD-10-CM | POA: Diagnosis not present

## 2020-09-02 DIAGNOSIS — R1312 Dysphagia, oropharyngeal phase: Secondary | ICD-10-CM | POA: Diagnosis not present

## 2020-09-02 DIAGNOSIS — M455 Ankylosing spondylitis of thoracolumbar region: Secondary | ICD-10-CM | POA: Diagnosis not present

## 2020-09-02 DIAGNOSIS — Z20828 Contact with and (suspected) exposure to other viral communicable diseases: Secondary | ICD-10-CM | POA: Diagnosis not present

## 2020-09-03 DIAGNOSIS — M455 Ankylosing spondylitis of thoracolumbar region: Secondary | ICD-10-CM | POA: Diagnosis not present

## 2020-09-03 DIAGNOSIS — S638X2D Sprain of other part of left wrist and hand, subsequent encounter: Secondary | ICD-10-CM | POA: Diagnosis not present

## 2020-09-03 DIAGNOSIS — K219 Gastro-esophageal reflux disease without esophagitis: Secondary | ICD-10-CM | POA: Diagnosis not present

## 2020-09-03 DIAGNOSIS — R1312 Dysphagia, oropharyngeal phase: Secondary | ICD-10-CM | POA: Diagnosis not present

## 2020-09-03 DIAGNOSIS — I10 Essential (primary) hypertension: Secondary | ICD-10-CM | POA: Diagnosis not present

## 2020-09-03 DIAGNOSIS — I5032 Chronic diastolic (congestive) heart failure: Secondary | ICD-10-CM | POA: Diagnosis not present

## 2020-09-04 DIAGNOSIS — S638X2D Sprain of other part of left wrist and hand, subsequent encounter: Secondary | ICD-10-CM | POA: Diagnosis not present

## 2020-09-04 DIAGNOSIS — R1312 Dysphagia, oropharyngeal phase: Secondary | ICD-10-CM | POA: Diagnosis not present

## 2020-09-04 DIAGNOSIS — K219 Gastro-esophageal reflux disease without esophagitis: Secondary | ICD-10-CM | POA: Diagnosis not present

## 2020-09-04 DIAGNOSIS — M455 Ankylosing spondylitis of thoracolumbar region: Secondary | ICD-10-CM | POA: Diagnosis not present

## 2020-09-04 DIAGNOSIS — I10 Essential (primary) hypertension: Secondary | ICD-10-CM | POA: Diagnosis not present

## 2020-09-04 DIAGNOSIS — I5032 Chronic diastolic (congestive) heart failure: Secondary | ICD-10-CM | POA: Diagnosis not present

## 2020-09-05 DIAGNOSIS — I10 Essential (primary) hypertension: Secondary | ICD-10-CM | POA: Diagnosis not present

## 2020-09-05 DIAGNOSIS — I5032 Chronic diastolic (congestive) heart failure: Secondary | ICD-10-CM | POA: Diagnosis not present

## 2020-09-05 DIAGNOSIS — R1312 Dysphagia, oropharyngeal phase: Secondary | ICD-10-CM | POA: Diagnosis not present

## 2020-09-05 DIAGNOSIS — S638X2D Sprain of other part of left wrist and hand, subsequent encounter: Secondary | ICD-10-CM | POA: Diagnosis not present

## 2020-09-05 DIAGNOSIS — K219 Gastro-esophageal reflux disease without esophagitis: Secondary | ICD-10-CM | POA: Diagnosis not present

## 2020-09-05 DIAGNOSIS — M455 Ankylosing spondylitis of thoracolumbar region: Secondary | ICD-10-CM | POA: Diagnosis not present

## 2020-09-06 DIAGNOSIS — K219 Gastro-esophageal reflux disease without esophagitis: Secondary | ICD-10-CM | POA: Diagnosis not present

## 2020-09-06 DIAGNOSIS — S638X2D Sprain of other part of left wrist and hand, subsequent encounter: Secondary | ICD-10-CM | POA: Diagnosis not present

## 2020-09-06 DIAGNOSIS — M455 Ankylosing spondylitis of thoracolumbar region: Secondary | ICD-10-CM | POA: Diagnosis not present

## 2020-09-06 DIAGNOSIS — I5032 Chronic diastolic (congestive) heart failure: Secondary | ICD-10-CM | POA: Diagnosis not present

## 2020-09-06 DIAGNOSIS — I10 Essential (primary) hypertension: Secondary | ICD-10-CM | POA: Diagnosis not present

## 2020-09-06 DIAGNOSIS — R1312 Dysphagia, oropharyngeal phase: Secondary | ICD-10-CM | POA: Diagnosis not present

## 2020-09-07 DIAGNOSIS — F4320 Adjustment disorder, unspecified: Secondary | ICD-10-CM | POA: Diagnosis not present

## 2020-09-07 DIAGNOSIS — R1312 Dysphagia, oropharyngeal phase: Secondary | ICD-10-CM | POA: Diagnosis not present

## 2020-09-07 DIAGNOSIS — M455 Ankylosing spondylitis of thoracolumbar region: Secondary | ICD-10-CM | POA: Diagnosis not present

## 2020-09-07 DIAGNOSIS — S638X2D Sprain of other part of left wrist and hand, subsequent encounter: Secondary | ICD-10-CM | POA: Diagnosis not present

## 2020-09-07 DIAGNOSIS — K219 Gastro-esophageal reflux disease without esophagitis: Secondary | ICD-10-CM | POA: Diagnosis not present

## 2020-09-07 DIAGNOSIS — I10 Essential (primary) hypertension: Secondary | ICD-10-CM | POA: Diagnosis not present

## 2020-09-07 DIAGNOSIS — I5032 Chronic diastolic (congestive) heart failure: Secondary | ICD-10-CM | POA: Diagnosis not present

## 2020-09-08 DIAGNOSIS — K219 Gastro-esophageal reflux disease without esophagitis: Secondary | ICD-10-CM | POA: Diagnosis not present

## 2020-09-08 DIAGNOSIS — M455 Ankylosing spondylitis of thoracolumbar region: Secondary | ICD-10-CM | POA: Diagnosis not present

## 2020-09-08 DIAGNOSIS — I5032 Chronic diastolic (congestive) heart failure: Secondary | ICD-10-CM | POA: Diagnosis not present

## 2020-09-08 DIAGNOSIS — Z20828 Contact with and (suspected) exposure to other viral communicable diseases: Secondary | ICD-10-CM | POA: Diagnosis not present

## 2020-09-08 DIAGNOSIS — S638X2D Sprain of other part of left wrist and hand, subsequent encounter: Secondary | ICD-10-CM | POA: Diagnosis not present

## 2020-09-08 DIAGNOSIS — I10 Essential (primary) hypertension: Secondary | ICD-10-CM | POA: Diagnosis not present

## 2020-09-08 DIAGNOSIS — R1312 Dysphagia, oropharyngeal phase: Secondary | ICD-10-CM | POA: Diagnosis not present

## 2020-09-11 DIAGNOSIS — K219 Gastro-esophageal reflux disease without esophagitis: Secondary | ICD-10-CM | POA: Diagnosis not present

## 2020-09-11 DIAGNOSIS — I10 Essential (primary) hypertension: Secondary | ICD-10-CM | POA: Diagnosis not present

## 2020-09-11 DIAGNOSIS — M455 Ankylosing spondylitis of thoracolumbar region: Secondary | ICD-10-CM | POA: Diagnosis not present

## 2020-09-11 DIAGNOSIS — R1312 Dysphagia, oropharyngeal phase: Secondary | ICD-10-CM | POA: Diagnosis not present

## 2020-09-11 DIAGNOSIS — I5032 Chronic diastolic (congestive) heart failure: Secondary | ICD-10-CM | POA: Diagnosis not present

## 2020-09-11 DIAGNOSIS — S638X2D Sprain of other part of left wrist and hand, subsequent encounter: Secondary | ICD-10-CM | POA: Diagnosis not present

## 2020-09-12 DIAGNOSIS — I10 Essential (primary) hypertension: Secondary | ICD-10-CM | POA: Diagnosis not present

## 2020-09-12 DIAGNOSIS — I5032 Chronic diastolic (congestive) heart failure: Secondary | ICD-10-CM | POA: Diagnosis not present

## 2020-09-12 DIAGNOSIS — Z20828 Contact with and (suspected) exposure to other viral communicable diseases: Secondary | ICD-10-CM | POA: Diagnosis not present

## 2020-09-12 DIAGNOSIS — M455 Ankylosing spondylitis of thoracolumbar region: Secondary | ICD-10-CM | POA: Diagnosis not present

## 2020-09-12 DIAGNOSIS — R1312 Dysphagia, oropharyngeal phase: Secondary | ICD-10-CM | POA: Diagnosis not present

## 2020-09-12 DIAGNOSIS — K219 Gastro-esophageal reflux disease without esophagitis: Secondary | ICD-10-CM | POA: Diagnosis not present

## 2020-09-12 DIAGNOSIS — S638X2D Sprain of other part of left wrist and hand, subsequent encounter: Secondary | ICD-10-CM | POA: Diagnosis not present

## 2020-09-16 DIAGNOSIS — Z20828 Contact with and (suspected) exposure to other viral communicable diseases: Secondary | ICD-10-CM | POA: Diagnosis not present

## 2020-09-16 DIAGNOSIS — K219 Gastro-esophageal reflux disease without esophagitis: Secondary | ICD-10-CM | POA: Diagnosis not present

## 2020-09-16 DIAGNOSIS — I10 Essential (primary) hypertension: Secondary | ICD-10-CM | POA: Diagnosis not present

## 2020-09-16 DIAGNOSIS — S638X2D Sprain of other part of left wrist and hand, subsequent encounter: Secondary | ICD-10-CM | POA: Diagnosis not present

## 2020-09-16 DIAGNOSIS — M455 Ankylosing spondylitis of thoracolumbar region: Secondary | ICD-10-CM | POA: Diagnosis not present

## 2020-09-16 DIAGNOSIS — R1312 Dysphagia, oropharyngeal phase: Secondary | ICD-10-CM | POA: Diagnosis not present

## 2020-09-16 DIAGNOSIS — I5032 Chronic diastolic (congestive) heart failure: Secondary | ICD-10-CM | POA: Diagnosis not present

## 2020-09-17 DIAGNOSIS — I10 Essential (primary) hypertension: Secondary | ICD-10-CM | POA: Diagnosis not present

## 2020-09-17 DIAGNOSIS — S638X2D Sprain of other part of left wrist and hand, subsequent encounter: Secondary | ICD-10-CM | POA: Diagnosis not present

## 2020-09-17 DIAGNOSIS — I5032 Chronic diastolic (congestive) heart failure: Secondary | ICD-10-CM | POA: Diagnosis not present

## 2020-09-17 DIAGNOSIS — R1312 Dysphagia, oropharyngeal phase: Secondary | ICD-10-CM | POA: Diagnosis not present

## 2020-09-17 DIAGNOSIS — M455 Ankylosing spondylitis of thoracolumbar region: Secondary | ICD-10-CM | POA: Diagnosis not present

## 2020-09-17 DIAGNOSIS — K219 Gastro-esophageal reflux disease without esophagitis: Secondary | ICD-10-CM | POA: Diagnosis not present

## 2020-09-17 DIAGNOSIS — F4320 Adjustment disorder, unspecified: Secondary | ICD-10-CM | POA: Diagnosis not present

## 2020-09-18 DIAGNOSIS — R1312 Dysphagia, oropharyngeal phase: Secondary | ICD-10-CM | POA: Diagnosis not present

## 2020-09-18 DIAGNOSIS — I10 Essential (primary) hypertension: Secondary | ICD-10-CM | POA: Diagnosis not present

## 2020-09-18 DIAGNOSIS — R109 Unspecified abdominal pain: Secondary | ICD-10-CM | POA: Diagnosis not present

## 2020-09-18 DIAGNOSIS — K219 Gastro-esophageal reflux disease without esophagitis: Secondary | ICD-10-CM | POA: Diagnosis not present

## 2020-09-18 DIAGNOSIS — G8929 Other chronic pain: Secondary | ICD-10-CM | POA: Diagnosis not present

## 2020-09-18 DIAGNOSIS — G4733 Obstructive sleep apnea (adult) (pediatric): Secondary | ICD-10-CM | POA: Diagnosis not present

## 2020-09-18 DIAGNOSIS — E559 Vitamin D deficiency, unspecified: Secondary | ICD-10-CM | POA: Diagnosis not present

## 2020-09-18 DIAGNOSIS — I5032 Chronic diastolic (congestive) heart failure: Secondary | ICD-10-CM | POA: Diagnosis not present

## 2020-09-18 DIAGNOSIS — Z66 Do not resuscitate: Secondary | ICD-10-CM | POA: Diagnosis not present

## 2020-09-18 DIAGNOSIS — M455 Ankylosing spondylitis of thoracolumbar region: Secondary | ICD-10-CM | POA: Diagnosis not present

## 2020-09-18 DIAGNOSIS — S638X2D Sprain of other part of left wrist and hand, subsequent encounter: Secondary | ICD-10-CM | POA: Diagnosis not present

## 2020-09-19 DIAGNOSIS — I5032 Chronic diastolic (congestive) heart failure: Secondary | ICD-10-CM | POA: Diagnosis not present

## 2020-09-19 DIAGNOSIS — Z20828 Contact with and (suspected) exposure to other viral communicable diseases: Secondary | ICD-10-CM | POA: Diagnosis not present

## 2020-09-19 DIAGNOSIS — R109 Unspecified abdominal pain: Secondary | ICD-10-CM | POA: Diagnosis not present

## 2020-09-19 DIAGNOSIS — S638X2D Sprain of other part of left wrist and hand, subsequent encounter: Secondary | ICD-10-CM | POA: Diagnosis not present

## 2020-09-19 DIAGNOSIS — N390 Urinary tract infection, site not specified: Secondary | ICD-10-CM | POA: Diagnosis not present

## 2020-09-19 DIAGNOSIS — I10 Essential (primary) hypertension: Secondary | ICD-10-CM | POA: Diagnosis not present

## 2020-09-19 DIAGNOSIS — K219 Gastro-esophageal reflux disease without esophagitis: Secondary | ICD-10-CM | POA: Diagnosis not present

## 2020-09-19 DIAGNOSIS — R1312 Dysphagia, oropharyngeal phase: Secondary | ICD-10-CM | POA: Diagnosis not present

## 2020-09-19 DIAGNOSIS — M455 Ankylosing spondylitis of thoracolumbar region: Secondary | ICD-10-CM | POA: Diagnosis not present

## 2020-09-20 DIAGNOSIS — I5032 Chronic diastolic (congestive) heart failure: Secondary | ICD-10-CM | POA: Diagnosis not present

## 2020-09-20 DIAGNOSIS — K219 Gastro-esophageal reflux disease without esophagitis: Secondary | ICD-10-CM | POA: Diagnosis not present

## 2020-09-20 DIAGNOSIS — R1312 Dysphagia, oropharyngeal phase: Secondary | ICD-10-CM | POA: Diagnosis not present

## 2020-09-20 DIAGNOSIS — S638X2D Sprain of other part of left wrist and hand, subsequent encounter: Secondary | ICD-10-CM | POA: Diagnosis not present

## 2020-09-20 DIAGNOSIS — I10 Essential (primary) hypertension: Secondary | ICD-10-CM | POA: Diagnosis not present

## 2020-09-20 DIAGNOSIS — M455 Ankylosing spondylitis of thoracolumbar region: Secondary | ICD-10-CM | POA: Diagnosis not present

## 2020-09-23 DIAGNOSIS — Z20828 Contact with and (suspected) exposure to other viral communicable diseases: Secondary | ICD-10-CM | POA: Diagnosis not present

## 2020-09-24 DIAGNOSIS — I5032 Chronic diastolic (congestive) heart failure: Secondary | ICD-10-CM | POA: Diagnosis not present

## 2020-09-24 DIAGNOSIS — I10 Essential (primary) hypertension: Secondary | ICD-10-CM | POA: Diagnosis not present

## 2020-09-24 DIAGNOSIS — S638X2D Sprain of other part of left wrist and hand, subsequent encounter: Secondary | ICD-10-CM | POA: Diagnosis not present

## 2020-09-24 DIAGNOSIS — K219 Gastro-esophageal reflux disease without esophagitis: Secondary | ICD-10-CM | POA: Diagnosis not present

## 2020-09-24 DIAGNOSIS — M455 Ankylosing spondylitis of thoracolumbar region: Secondary | ICD-10-CM | POA: Diagnosis not present

## 2020-09-24 DIAGNOSIS — R1312 Dysphagia, oropharyngeal phase: Secondary | ICD-10-CM | POA: Diagnosis not present

## 2020-09-25 DIAGNOSIS — K219 Gastro-esophageal reflux disease without esophagitis: Secondary | ICD-10-CM | POA: Diagnosis not present

## 2020-09-25 DIAGNOSIS — I5032 Chronic diastolic (congestive) heart failure: Secondary | ICD-10-CM | POA: Diagnosis not present

## 2020-09-25 DIAGNOSIS — S638X2D Sprain of other part of left wrist and hand, subsequent encounter: Secondary | ICD-10-CM | POA: Diagnosis not present

## 2020-09-25 DIAGNOSIS — M455 Ankylosing spondylitis of thoracolumbar region: Secondary | ICD-10-CM | POA: Diagnosis not present

## 2020-09-25 DIAGNOSIS — I10 Essential (primary) hypertension: Secondary | ICD-10-CM | POA: Diagnosis not present

## 2020-09-25 DIAGNOSIS — F4320 Adjustment disorder, unspecified: Secondary | ICD-10-CM | POA: Diagnosis not present

## 2020-09-25 DIAGNOSIS — R1312 Dysphagia, oropharyngeal phase: Secondary | ICD-10-CM | POA: Diagnosis not present

## 2020-09-26 DIAGNOSIS — R1312 Dysphagia, oropharyngeal phase: Secondary | ICD-10-CM | POA: Diagnosis not present

## 2020-09-26 DIAGNOSIS — S638X2D Sprain of other part of left wrist and hand, subsequent encounter: Secondary | ICD-10-CM | POA: Diagnosis not present

## 2020-09-26 DIAGNOSIS — K219 Gastro-esophageal reflux disease without esophagitis: Secondary | ICD-10-CM | POA: Diagnosis not present

## 2020-09-26 DIAGNOSIS — M455 Ankylosing spondylitis of thoracolumbar region: Secondary | ICD-10-CM | POA: Diagnosis not present

## 2020-09-26 DIAGNOSIS — Z20828 Contact with and (suspected) exposure to other viral communicable diseases: Secondary | ICD-10-CM | POA: Diagnosis not present

## 2020-09-26 DIAGNOSIS — I5032 Chronic diastolic (congestive) heart failure: Secondary | ICD-10-CM | POA: Diagnosis not present

## 2020-09-26 DIAGNOSIS — I10 Essential (primary) hypertension: Secondary | ICD-10-CM | POA: Diagnosis not present

## 2020-09-27 DIAGNOSIS — I5032 Chronic diastolic (congestive) heart failure: Secondary | ICD-10-CM | POA: Diagnosis not present

## 2020-09-27 DIAGNOSIS — K219 Gastro-esophageal reflux disease without esophagitis: Secondary | ICD-10-CM | POA: Diagnosis not present

## 2020-09-27 DIAGNOSIS — M455 Ankylosing spondylitis of thoracolumbar region: Secondary | ICD-10-CM | POA: Diagnosis not present

## 2020-09-27 DIAGNOSIS — R1312 Dysphagia, oropharyngeal phase: Secondary | ICD-10-CM | POA: Diagnosis not present

## 2020-09-27 DIAGNOSIS — S638X2D Sprain of other part of left wrist and hand, subsequent encounter: Secondary | ICD-10-CM | POA: Diagnosis not present

## 2020-09-27 DIAGNOSIS — I10 Essential (primary) hypertension: Secondary | ICD-10-CM | POA: Diagnosis not present

## 2020-09-30 DIAGNOSIS — S638X2D Sprain of other part of left wrist and hand, subsequent encounter: Secondary | ICD-10-CM | POA: Diagnosis not present

## 2020-09-30 DIAGNOSIS — I10 Essential (primary) hypertension: Secondary | ICD-10-CM | POA: Diagnosis not present

## 2020-09-30 DIAGNOSIS — R2689 Other abnormalities of gait and mobility: Secondary | ICD-10-CM | POA: Diagnosis not present

## 2020-09-30 DIAGNOSIS — R1312 Dysphagia, oropharyngeal phase: Secondary | ICD-10-CM | POA: Diagnosis not present

## 2020-09-30 DIAGNOSIS — R278 Other lack of coordination: Secondary | ICD-10-CM | POA: Diagnosis not present

## 2020-09-30 DIAGNOSIS — M6281 Muscle weakness (generalized): Secondary | ICD-10-CM | POA: Diagnosis not present

## 2020-09-30 DIAGNOSIS — I5032 Chronic diastolic (congestive) heart failure: Secondary | ICD-10-CM | POA: Diagnosis not present

## 2020-09-30 DIAGNOSIS — M455 Ankylosing spondylitis of thoracolumbar region: Secondary | ICD-10-CM | POA: Diagnosis not present

## 2020-09-30 DIAGNOSIS — R2681 Unsteadiness on feet: Secondary | ICD-10-CM | POA: Diagnosis not present

## 2020-09-30 DIAGNOSIS — K219 Gastro-esophageal reflux disease without esophagitis: Secondary | ICD-10-CM | POA: Diagnosis not present

## 2020-10-01 DIAGNOSIS — I5032 Chronic diastolic (congestive) heart failure: Secondary | ICD-10-CM | POA: Diagnosis not present

## 2020-10-01 DIAGNOSIS — K219 Gastro-esophageal reflux disease without esophagitis: Secondary | ICD-10-CM | POA: Diagnosis not present

## 2020-10-01 DIAGNOSIS — I10 Essential (primary) hypertension: Secondary | ICD-10-CM | POA: Diagnosis not present

## 2020-10-01 DIAGNOSIS — R1312 Dysphagia, oropharyngeal phase: Secondary | ICD-10-CM | POA: Diagnosis not present

## 2020-10-01 DIAGNOSIS — M455 Ankylosing spondylitis of thoracolumbar region: Secondary | ICD-10-CM | POA: Diagnosis not present

## 2020-10-01 DIAGNOSIS — S638X2D Sprain of other part of left wrist and hand, subsequent encounter: Secondary | ICD-10-CM | POA: Diagnosis not present

## 2020-10-02 DIAGNOSIS — R1312 Dysphagia, oropharyngeal phase: Secondary | ICD-10-CM | POA: Diagnosis not present

## 2020-10-02 DIAGNOSIS — K219 Gastro-esophageal reflux disease without esophagitis: Secondary | ICD-10-CM | POA: Diagnosis not present

## 2020-10-02 DIAGNOSIS — I10 Essential (primary) hypertension: Secondary | ICD-10-CM | POA: Diagnosis not present

## 2020-10-02 DIAGNOSIS — M455 Ankylosing spondylitis of thoracolumbar region: Secondary | ICD-10-CM | POA: Diagnosis not present

## 2020-10-02 DIAGNOSIS — S638X2D Sprain of other part of left wrist and hand, subsequent encounter: Secondary | ICD-10-CM | POA: Diagnosis not present

## 2020-10-02 DIAGNOSIS — I5032 Chronic diastolic (congestive) heart failure: Secondary | ICD-10-CM | POA: Diagnosis not present

## 2020-10-03 DIAGNOSIS — I10 Essential (primary) hypertension: Secondary | ICD-10-CM | POA: Diagnosis not present

## 2020-10-03 DIAGNOSIS — R1312 Dysphagia, oropharyngeal phase: Secondary | ICD-10-CM | POA: Diagnosis not present

## 2020-10-03 DIAGNOSIS — I5032 Chronic diastolic (congestive) heart failure: Secondary | ICD-10-CM | POA: Diagnosis not present

## 2020-10-03 DIAGNOSIS — S638X2D Sprain of other part of left wrist and hand, subsequent encounter: Secondary | ICD-10-CM | POA: Diagnosis not present

## 2020-10-03 DIAGNOSIS — K219 Gastro-esophageal reflux disease without esophagitis: Secondary | ICD-10-CM | POA: Diagnosis not present

## 2020-10-03 DIAGNOSIS — M455 Ankylosing spondylitis of thoracolumbar region: Secondary | ICD-10-CM | POA: Diagnosis not present

## 2020-10-04 DIAGNOSIS — S638X2D Sprain of other part of left wrist and hand, subsequent encounter: Secondary | ICD-10-CM | POA: Diagnosis not present

## 2020-10-04 DIAGNOSIS — I5032 Chronic diastolic (congestive) heart failure: Secondary | ICD-10-CM | POA: Diagnosis not present

## 2020-10-04 DIAGNOSIS — K219 Gastro-esophageal reflux disease without esophagitis: Secondary | ICD-10-CM | POA: Diagnosis not present

## 2020-10-04 DIAGNOSIS — R1312 Dysphagia, oropharyngeal phase: Secondary | ICD-10-CM | POA: Diagnosis not present

## 2020-10-04 DIAGNOSIS — I10 Essential (primary) hypertension: Secondary | ICD-10-CM | POA: Diagnosis not present

## 2020-10-04 DIAGNOSIS — M455 Ankylosing spondylitis of thoracolumbar region: Secondary | ICD-10-CM | POA: Diagnosis not present

## 2020-10-07 DIAGNOSIS — K219 Gastro-esophageal reflux disease without esophagitis: Secondary | ICD-10-CM | POA: Diagnosis not present

## 2020-10-07 DIAGNOSIS — I10 Essential (primary) hypertension: Secondary | ICD-10-CM | POA: Diagnosis not present

## 2020-10-07 DIAGNOSIS — R1312 Dysphagia, oropharyngeal phase: Secondary | ICD-10-CM | POA: Diagnosis not present

## 2020-10-07 DIAGNOSIS — I5032 Chronic diastolic (congestive) heart failure: Secondary | ICD-10-CM | POA: Diagnosis not present

## 2020-10-07 DIAGNOSIS — M455 Ankylosing spondylitis of thoracolumbar region: Secondary | ICD-10-CM | POA: Diagnosis not present

## 2020-10-07 DIAGNOSIS — S638X2D Sprain of other part of left wrist and hand, subsequent encounter: Secondary | ICD-10-CM | POA: Diagnosis not present

## 2020-10-08 DIAGNOSIS — S638X2D Sprain of other part of left wrist and hand, subsequent encounter: Secondary | ICD-10-CM | POA: Diagnosis not present

## 2020-10-08 DIAGNOSIS — I10 Essential (primary) hypertension: Secondary | ICD-10-CM | POA: Diagnosis not present

## 2020-10-08 DIAGNOSIS — R1312 Dysphagia, oropharyngeal phase: Secondary | ICD-10-CM | POA: Diagnosis not present

## 2020-10-08 DIAGNOSIS — I5032 Chronic diastolic (congestive) heart failure: Secondary | ICD-10-CM | POA: Diagnosis not present

## 2020-10-08 DIAGNOSIS — K219 Gastro-esophageal reflux disease without esophagitis: Secondary | ICD-10-CM | POA: Diagnosis not present

## 2020-10-08 DIAGNOSIS — M455 Ankylosing spondylitis of thoracolumbar region: Secondary | ICD-10-CM | POA: Diagnosis not present

## 2020-10-09 DIAGNOSIS — S638X2D Sprain of other part of left wrist and hand, subsequent encounter: Secondary | ICD-10-CM | POA: Diagnosis not present

## 2020-10-09 DIAGNOSIS — M455 Ankylosing spondylitis of thoracolumbar region: Secondary | ICD-10-CM | POA: Diagnosis not present

## 2020-10-09 DIAGNOSIS — I5032 Chronic diastolic (congestive) heart failure: Secondary | ICD-10-CM | POA: Diagnosis not present

## 2020-10-09 DIAGNOSIS — R1312 Dysphagia, oropharyngeal phase: Secondary | ICD-10-CM | POA: Diagnosis not present

## 2020-10-09 DIAGNOSIS — K219 Gastro-esophageal reflux disease without esophagitis: Secondary | ICD-10-CM | POA: Diagnosis not present

## 2020-10-09 DIAGNOSIS — I10 Essential (primary) hypertension: Secondary | ICD-10-CM | POA: Diagnosis not present

## 2020-10-10 DIAGNOSIS — I5032 Chronic diastolic (congestive) heart failure: Secondary | ICD-10-CM | POA: Diagnosis not present

## 2020-10-10 DIAGNOSIS — R1312 Dysphagia, oropharyngeal phase: Secondary | ICD-10-CM | POA: Diagnosis not present

## 2020-10-10 DIAGNOSIS — S638X2D Sprain of other part of left wrist and hand, subsequent encounter: Secondary | ICD-10-CM | POA: Diagnosis not present

## 2020-10-10 DIAGNOSIS — K219 Gastro-esophageal reflux disease without esophagitis: Secondary | ICD-10-CM | POA: Diagnosis not present

## 2020-10-10 DIAGNOSIS — M455 Ankylosing spondylitis of thoracolumbar region: Secondary | ICD-10-CM | POA: Diagnosis not present

## 2020-10-10 DIAGNOSIS — I10 Essential (primary) hypertension: Secondary | ICD-10-CM | POA: Diagnosis not present

## 2020-10-11 DIAGNOSIS — I5032 Chronic diastolic (congestive) heart failure: Secondary | ICD-10-CM | POA: Diagnosis not present

## 2020-10-11 DIAGNOSIS — M455 Ankylosing spondylitis of thoracolumbar region: Secondary | ICD-10-CM | POA: Diagnosis not present

## 2020-10-11 DIAGNOSIS — S638X2D Sprain of other part of left wrist and hand, subsequent encounter: Secondary | ICD-10-CM | POA: Diagnosis not present

## 2020-10-11 DIAGNOSIS — K219 Gastro-esophageal reflux disease without esophagitis: Secondary | ICD-10-CM | POA: Diagnosis not present

## 2020-10-11 DIAGNOSIS — I10 Essential (primary) hypertension: Secondary | ICD-10-CM | POA: Diagnosis not present

## 2020-10-11 DIAGNOSIS — R1312 Dysphagia, oropharyngeal phase: Secondary | ICD-10-CM | POA: Diagnosis not present

## 2020-10-14 DIAGNOSIS — S638X2D Sprain of other part of left wrist and hand, subsequent encounter: Secondary | ICD-10-CM | POA: Diagnosis not present

## 2020-10-14 DIAGNOSIS — I5032 Chronic diastolic (congestive) heart failure: Secondary | ICD-10-CM | POA: Diagnosis not present

## 2020-10-14 DIAGNOSIS — R1312 Dysphagia, oropharyngeal phase: Secondary | ICD-10-CM | POA: Diagnosis not present

## 2020-10-14 DIAGNOSIS — K219 Gastro-esophageal reflux disease without esophagitis: Secondary | ICD-10-CM | POA: Diagnosis not present

## 2020-10-14 DIAGNOSIS — I10 Essential (primary) hypertension: Secondary | ICD-10-CM | POA: Diagnosis not present

## 2020-10-14 DIAGNOSIS — M455 Ankylosing spondylitis of thoracolumbar region: Secondary | ICD-10-CM | POA: Diagnosis not present

## 2020-10-15 DIAGNOSIS — S638X2D Sprain of other part of left wrist and hand, subsequent encounter: Secondary | ICD-10-CM | POA: Diagnosis not present

## 2020-10-15 DIAGNOSIS — I10 Essential (primary) hypertension: Secondary | ICD-10-CM | POA: Diagnosis not present

## 2020-10-15 DIAGNOSIS — I5032 Chronic diastolic (congestive) heart failure: Secondary | ICD-10-CM | POA: Diagnosis not present

## 2020-10-15 DIAGNOSIS — K219 Gastro-esophageal reflux disease without esophagitis: Secondary | ICD-10-CM | POA: Diagnosis not present

## 2020-10-15 DIAGNOSIS — R1312 Dysphagia, oropharyngeal phase: Secondary | ICD-10-CM | POA: Diagnosis not present

## 2020-10-15 DIAGNOSIS — M455 Ankylosing spondylitis of thoracolumbar region: Secondary | ICD-10-CM | POA: Diagnosis not present

## 2020-10-16 DIAGNOSIS — F4320 Adjustment disorder, unspecified: Secondary | ICD-10-CM | POA: Diagnosis not present

## 2020-10-17 DIAGNOSIS — F4320 Adjustment disorder, unspecified: Secondary | ICD-10-CM | POA: Diagnosis not present

## 2020-10-23 DIAGNOSIS — E559 Vitamin D deficiency, unspecified: Secondary | ICD-10-CM | POA: Diagnosis not present

## 2020-10-23 DIAGNOSIS — Z66 Do not resuscitate: Secondary | ICD-10-CM | POA: Diagnosis not present

## 2020-10-23 DIAGNOSIS — R109 Unspecified abdominal pain: Secondary | ICD-10-CM | POA: Diagnosis not present

## 2020-10-23 DIAGNOSIS — G8929 Other chronic pain: Secondary | ICD-10-CM | POA: Diagnosis not present

## 2020-10-23 DIAGNOSIS — I10 Essential (primary) hypertension: Secondary | ICD-10-CM | POA: Diagnosis not present

## 2020-10-23 DIAGNOSIS — G4733 Obstructive sleep apnea (adult) (pediatric): Secondary | ICD-10-CM | POA: Diagnosis not present

## 2020-11-05 DIAGNOSIS — Z20822 Contact with and (suspected) exposure to covid-19: Secondary | ICD-10-CM | POA: Diagnosis not present

## 2020-11-07 DIAGNOSIS — Z20822 Contact with and (suspected) exposure to covid-19: Secondary | ICD-10-CM | POA: Diagnosis not present

## 2020-11-09 DIAGNOSIS — F4320 Adjustment disorder, unspecified: Secondary | ICD-10-CM | POA: Diagnosis not present

## 2020-11-12 DIAGNOSIS — F4320 Adjustment disorder, unspecified: Secondary | ICD-10-CM | POA: Diagnosis not present

## 2020-11-29 DIAGNOSIS — M455 Ankylosing spondylitis of thoracolumbar region: Secondary | ICD-10-CM | POA: Diagnosis not present

## 2020-11-29 DIAGNOSIS — K219 Gastro-esophageal reflux disease without esophagitis: Secondary | ICD-10-CM | POA: Diagnosis not present

## 2020-11-29 DIAGNOSIS — S638X2D Sprain of other part of left wrist and hand, subsequent encounter: Secondary | ICD-10-CM | POA: Diagnosis not present

## 2020-11-29 DIAGNOSIS — R2681 Unsteadiness on feet: Secondary | ICD-10-CM | POA: Diagnosis not present

## 2020-11-29 DIAGNOSIS — I10 Essential (primary) hypertension: Secondary | ICD-10-CM | POA: Diagnosis not present

## 2020-11-29 DIAGNOSIS — R1312 Dysphagia, oropharyngeal phase: Secondary | ICD-10-CM | POA: Diagnosis not present

## 2020-11-29 DIAGNOSIS — R2689 Other abnormalities of gait and mobility: Secondary | ICD-10-CM | POA: Diagnosis not present

## 2020-11-29 DIAGNOSIS — I5032 Chronic diastolic (congestive) heart failure: Secondary | ICD-10-CM | POA: Diagnosis not present

## 2020-11-29 DIAGNOSIS — M6281 Muscle weakness (generalized): Secondary | ICD-10-CM | POA: Diagnosis not present

## 2020-11-29 DIAGNOSIS — R278 Other lack of coordination: Secondary | ICD-10-CM | POA: Diagnosis not present

## 2020-12-02 DIAGNOSIS — M455 Ankylosing spondylitis of thoracolumbar region: Secondary | ICD-10-CM | POA: Diagnosis not present

## 2020-12-02 DIAGNOSIS — R1312 Dysphagia, oropharyngeal phase: Secondary | ICD-10-CM | POA: Diagnosis not present

## 2020-12-02 DIAGNOSIS — R278 Other lack of coordination: Secondary | ICD-10-CM | POA: Diagnosis not present

## 2020-12-02 DIAGNOSIS — S638X2D Sprain of other part of left wrist and hand, subsequent encounter: Secondary | ICD-10-CM | POA: Diagnosis not present

## 2020-12-02 DIAGNOSIS — R2681 Unsteadiness on feet: Secondary | ICD-10-CM | POA: Diagnosis not present

## 2020-12-02 DIAGNOSIS — M6281 Muscle weakness (generalized): Secondary | ICD-10-CM | POA: Diagnosis not present

## 2020-12-02 DIAGNOSIS — I10 Essential (primary) hypertension: Secondary | ICD-10-CM | POA: Diagnosis not present

## 2020-12-02 DIAGNOSIS — R2689 Other abnormalities of gait and mobility: Secondary | ICD-10-CM | POA: Diagnosis not present

## 2020-12-02 DIAGNOSIS — K219 Gastro-esophageal reflux disease without esophagitis: Secondary | ICD-10-CM | POA: Diagnosis not present

## 2020-12-02 DIAGNOSIS — I5032 Chronic diastolic (congestive) heart failure: Secondary | ICD-10-CM | POA: Diagnosis not present

## 2020-12-03 DIAGNOSIS — S638X2D Sprain of other part of left wrist and hand, subsequent encounter: Secondary | ICD-10-CM | POA: Diagnosis not present

## 2020-12-03 DIAGNOSIS — K219 Gastro-esophageal reflux disease without esophagitis: Secondary | ICD-10-CM | POA: Diagnosis not present

## 2020-12-03 DIAGNOSIS — R1312 Dysphagia, oropharyngeal phase: Secondary | ICD-10-CM | POA: Diagnosis not present

## 2020-12-03 DIAGNOSIS — M455 Ankylosing spondylitis of thoracolumbar region: Secondary | ICD-10-CM | POA: Diagnosis not present

## 2020-12-03 DIAGNOSIS — I5032 Chronic diastolic (congestive) heart failure: Secondary | ICD-10-CM | POA: Diagnosis not present

## 2020-12-03 DIAGNOSIS — I10 Essential (primary) hypertension: Secondary | ICD-10-CM | POA: Diagnosis not present

## 2020-12-04 DIAGNOSIS — K219 Gastro-esophageal reflux disease without esophagitis: Secondary | ICD-10-CM | POA: Diagnosis not present

## 2020-12-04 DIAGNOSIS — M455 Ankylosing spondylitis of thoracolumbar region: Secondary | ICD-10-CM | POA: Diagnosis not present

## 2020-12-04 DIAGNOSIS — I10 Essential (primary) hypertension: Secondary | ICD-10-CM | POA: Diagnosis not present

## 2020-12-04 DIAGNOSIS — S638X2D Sprain of other part of left wrist and hand, subsequent encounter: Secondary | ICD-10-CM | POA: Diagnosis not present

## 2020-12-04 DIAGNOSIS — I5032 Chronic diastolic (congestive) heart failure: Secondary | ICD-10-CM | POA: Diagnosis not present

## 2020-12-04 DIAGNOSIS — R1312 Dysphagia, oropharyngeal phase: Secondary | ICD-10-CM | POA: Diagnosis not present

## 2020-12-05 DIAGNOSIS — S638X2D Sprain of other part of left wrist and hand, subsequent encounter: Secondary | ICD-10-CM | POA: Diagnosis not present

## 2020-12-05 DIAGNOSIS — R1312 Dysphagia, oropharyngeal phase: Secondary | ICD-10-CM | POA: Diagnosis not present

## 2020-12-05 DIAGNOSIS — K219 Gastro-esophageal reflux disease without esophagitis: Secondary | ICD-10-CM | POA: Diagnosis not present

## 2020-12-05 DIAGNOSIS — M455 Ankylosing spondylitis of thoracolumbar region: Secondary | ICD-10-CM | POA: Diagnosis not present

## 2020-12-05 DIAGNOSIS — I10 Essential (primary) hypertension: Secondary | ICD-10-CM | POA: Diagnosis not present

## 2020-12-05 DIAGNOSIS — I5032 Chronic diastolic (congestive) heart failure: Secondary | ICD-10-CM | POA: Diagnosis not present

## 2020-12-06 DIAGNOSIS — M455 Ankylosing spondylitis of thoracolumbar region: Secondary | ICD-10-CM | POA: Diagnosis not present

## 2020-12-06 DIAGNOSIS — S638X2D Sprain of other part of left wrist and hand, subsequent encounter: Secondary | ICD-10-CM | POA: Diagnosis not present

## 2020-12-06 DIAGNOSIS — I5032 Chronic diastolic (congestive) heart failure: Secondary | ICD-10-CM | POA: Diagnosis not present

## 2020-12-06 DIAGNOSIS — K219 Gastro-esophageal reflux disease without esophagitis: Secondary | ICD-10-CM | POA: Diagnosis not present

## 2020-12-06 DIAGNOSIS — I10 Essential (primary) hypertension: Secondary | ICD-10-CM | POA: Diagnosis not present

## 2020-12-06 DIAGNOSIS — R1312 Dysphagia, oropharyngeal phase: Secondary | ICD-10-CM | POA: Diagnosis not present

## 2020-12-07 DIAGNOSIS — K219 Gastro-esophageal reflux disease without esophagitis: Secondary | ICD-10-CM | POA: Diagnosis not present

## 2020-12-07 DIAGNOSIS — M455 Ankylosing spondylitis of thoracolumbar region: Secondary | ICD-10-CM | POA: Diagnosis not present

## 2020-12-07 DIAGNOSIS — I5032 Chronic diastolic (congestive) heart failure: Secondary | ICD-10-CM | POA: Diagnosis not present

## 2020-12-07 DIAGNOSIS — I10 Essential (primary) hypertension: Secondary | ICD-10-CM | POA: Diagnosis not present

## 2020-12-07 DIAGNOSIS — R1312 Dysphagia, oropharyngeal phase: Secondary | ICD-10-CM | POA: Diagnosis not present

## 2020-12-07 DIAGNOSIS — S638X2D Sprain of other part of left wrist and hand, subsequent encounter: Secondary | ICD-10-CM | POA: Diagnosis not present

## 2020-12-10 DIAGNOSIS — I5032 Chronic diastolic (congestive) heart failure: Secondary | ICD-10-CM | POA: Diagnosis not present

## 2020-12-10 DIAGNOSIS — S638X2D Sprain of other part of left wrist and hand, subsequent encounter: Secondary | ICD-10-CM | POA: Diagnosis not present

## 2020-12-10 DIAGNOSIS — I10 Essential (primary) hypertension: Secondary | ICD-10-CM | POA: Diagnosis not present

## 2020-12-10 DIAGNOSIS — R1312 Dysphagia, oropharyngeal phase: Secondary | ICD-10-CM | POA: Diagnosis not present

## 2020-12-10 DIAGNOSIS — K219 Gastro-esophageal reflux disease without esophagitis: Secondary | ICD-10-CM | POA: Diagnosis not present

## 2020-12-10 DIAGNOSIS — M455 Ankylosing spondylitis of thoracolumbar region: Secondary | ICD-10-CM | POA: Diagnosis not present

## 2020-12-11 DIAGNOSIS — K219 Gastro-esophageal reflux disease without esophagitis: Secondary | ICD-10-CM | POA: Diagnosis not present

## 2020-12-11 DIAGNOSIS — S638X2D Sprain of other part of left wrist and hand, subsequent encounter: Secondary | ICD-10-CM | POA: Diagnosis not present

## 2020-12-11 DIAGNOSIS — R1312 Dysphagia, oropharyngeal phase: Secondary | ICD-10-CM | POA: Diagnosis not present

## 2020-12-11 DIAGNOSIS — I10 Essential (primary) hypertension: Secondary | ICD-10-CM | POA: Diagnosis not present

## 2020-12-11 DIAGNOSIS — I5032 Chronic diastolic (congestive) heart failure: Secondary | ICD-10-CM | POA: Diagnosis not present

## 2020-12-11 DIAGNOSIS — M455 Ankylosing spondylitis of thoracolumbar region: Secondary | ICD-10-CM | POA: Diagnosis not present

## 2020-12-12 DIAGNOSIS — I10 Essential (primary) hypertension: Secondary | ICD-10-CM | POA: Diagnosis not present

## 2020-12-12 DIAGNOSIS — S638X2D Sprain of other part of left wrist and hand, subsequent encounter: Secondary | ICD-10-CM | POA: Diagnosis not present

## 2020-12-12 DIAGNOSIS — R1312 Dysphagia, oropharyngeal phase: Secondary | ICD-10-CM | POA: Diagnosis not present

## 2020-12-12 DIAGNOSIS — K219 Gastro-esophageal reflux disease without esophagitis: Secondary | ICD-10-CM | POA: Diagnosis not present

## 2020-12-12 DIAGNOSIS — M455 Ankylosing spondylitis of thoracolumbar region: Secondary | ICD-10-CM | POA: Diagnosis not present

## 2020-12-12 DIAGNOSIS — I5032 Chronic diastolic (congestive) heart failure: Secondary | ICD-10-CM | POA: Diagnosis not present

## 2020-12-13 DIAGNOSIS — F4320 Adjustment disorder, unspecified: Secondary | ICD-10-CM | POA: Diagnosis not present

## 2020-12-24 DIAGNOSIS — E119 Type 2 diabetes mellitus without complications: Secondary | ICD-10-CM | POA: Diagnosis not present

## 2020-12-25 DIAGNOSIS — E559 Vitamin D deficiency, unspecified: Secondary | ICD-10-CM | POA: Diagnosis not present

## 2020-12-25 DIAGNOSIS — G8929 Other chronic pain: Secondary | ICD-10-CM | POA: Diagnosis not present

## 2020-12-25 DIAGNOSIS — G4733 Obstructive sleep apnea (adult) (pediatric): Secondary | ICD-10-CM | POA: Diagnosis not present

## 2020-12-25 DIAGNOSIS — I10 Essential (primary) hypertension: Secondary | ICD-10-CM | POA: Diagnosis not present

## 2020-12-25 DIAGNOSIS — Z66 Do not resuscitate: Secondary | ICD-10-CM | POA: Diagnosis not present

## 2020-12-25 DIAGNOSIS — R109 Unspecified abdominal pain: Secondary | ICD-10-CM | POA: Diagnosis not present

## 2020-12-30 DIAGNOSIS — E119 Type 2 diabetes mellitus without complications: Secondary | ICD-10-CM | POA: Diagnosis not present

## 2021-01-10 DIAGNOSIS — F4320 Adjustment disorder, unspecified: Secondary | ICD-10-CM | POA: Diagnosis not present

## 2021-01-13 DIAGNOSIS — N3020 Other chronic cystitis without hematuria: Secondary | ICD-10-CM | POA: Diagnosis not present

## 2021-01-13 DIAGNOSIS — I5032 Chronic diastolic (congestive) heart failure: Secondary | ICD-10-CM | POA: Diagnosis not present

## 2021-01-13 DIAGNOSIS — I10 Essential (primary) hypertension: Secondary | ICD-10-CM | POA: Diagnosis not present

## 2021-01-13 DIAGNOSIS — K449 Diaphragmatic hernia without obstruction or gangrene: Secondary | ICD-10-CM | POA: Diagnosis not present

## 2021-01-14 DIAGNOSIS — N390 Urinary tract infection, site not specified: Secondary | ICD-10-CM | POA: Diagnosis not present

## 2021-01-15 DIAGNOSIS — K449 Diaphragmatic hernia without obstruction or gangrene: Secondary | ICD-10-CM | POA: Diagnosis not present

## 2021-01-15 DIAGNOSIS — N390 Urinary tract infection, site not specified: Secondary | ICD-10-CM | POA: Diagnosis not present

## 2021-01-15 DIAGNOSIS — N3020 Other chronic cystitis without hematuria: Secondary | ICD-10-CM | POA: Diagnosis not present

## 2021-01-15 DIAGNOSIS — I5032 Chronic diastolic (congestive) heart failure: Secondary | ICD-10-CM | POA: Diagnosis not present

## 2021-01-15 DIAGNOSIS — N323 Diverticulum of bladder: Secondary | ICD-10-CM | POA: Diagnosis not present

## 2021-12-14 ENCOUNTER — Encounter (HOSPITAL_COMMUNITY): Payer: Self-pay

## 2021-12-14 ENCOUNTER — Other Ambulatory Visit: Payer: Self-pay

## 2021-12-14 ENCOUNTER — Emergency Department (HOSPITAL_COMMUNITY): Payer: Medicare Other

## 2021-12-14 ENCOUNTER — Inpatient Hospital Stay (HOSPITAL_COMMUNITY)
Admission: EM | Admit: 2021-12-14 | Discharge: 2021-12-16 | DRG: 378 | Disposition: A | Discharge status: Skilled Nursing Facility | Payer: Medicare Other | Source: Skilled Nursing Facility | Attending: Family Medicine | Admitting: Family Medicine

## 2021-12-14 DIAGNOSIS — Z882 Allergy status to sulfonamides status: Secondary | ICD-10-CM

## 2021-12-14 DIAGNOSIS — K922 Gastrointestinal hemorrhage, unspecified: Secondary | ICD-10-CM

## 2021-12-14 DIAGNOSIS — Z20822 Contact with and (suspected) exposure to covid-19: Secondary | ICD-10-CM | POA: Diagnosis present

## 2021-12-14 DIAGNOSIS — I5032 Chronic diastolic (congestive) heart failure: Secondary | ICD-10-CM | POA: Diagnosis present

## 2021-12-14 DIAGNOSIS — Z880 Allergy status to penicillin: Secondary | ICD-10-CM

## 2021-12-14 DIAGNOSIS — K219 Gastro-esophageal reflux disease without esophagitis: Secondary | ICD-10-CM | POA: Diagnosis present

## 2021-12-14 DIAGNOSIS — Z79891 Long term (current) use of opiate analgesic: Secondary | ICD-10-CM

## 2021-12-14 DIAGNOSIS — K429 Umbilical hernia without obstruction or gangrene: Secondary | ICD-10-CM | POA: Diagnosis present

## 2021-12-14 DIAGNOSIS — M810 Age-related osteoporosis without current pathological fracture: Secondary | ICD-10-CM | POA: Diagnosis present

## 2021-12-14 DIAGNOSIS — Z8249 Family history of ischemic heart disease and other diseases of the circulatory system: Secondary | ICD-10-CM

## 2021-12-14 DIAGNOSIS — Z79818 Long term (current) use of other agents affecting estrogen receptors and estrogen levels: Secondary | ICD-10-CM | POA: Diagnosis not present

## 2021-12-14 DIAGNOSIS — K449 Diaphragmatic hernia without obstruction or gangrene: Secondary | ICD-10-CM | POA: Diagnosis present

## 2021-12-14 DIAGNOSIS — Z7401 Bed confinement status: Secondary | ICD-10-CM

## 2021-12-14 DIAGNOSIS — K920 Hematemesis: Principal | ICD-10-CM | POA: Diagnosis present

## 2021-12-14 DIAGNOSIS — I340 Nonrheumatic mitral (valve) insufficiency: Secondary | ICD-10-CM | POA: Diagnosis present

## 2021-12-14 DIAGNOSIS — Z66 Do not resuscitate: Secondary | ICD-10-CM | POA: Diagnosis present

## 2021-12-14 DIAGNOSIS — N390 Urinary tract infection, site not specified: Secondary | ICD-10-CM | POA: Diagnosis present

## 2021-12-14 DIAGNOSIS — Z886 Allergy status to analgesic agent status: Secondary | ICD-10-CM | POA: Diagnosis not present

## 2021-12-14 DIAGNOSIS — Z87891 Personal history of nicotine dependence: Secondary | ICD-10-CM | POA: Diagnosis not present

## 2021-12-14 DIAGNOSIS — D72829 Elevated white blood cell count, unspecified: Secondary | ICD-10-CM | POA: Diagnosis present

## 2021-12-14 DIAGNOSIS — I110 Hypertensive heart disease with heart failure: Secondary | ICD-10-CM | POA: Diagnosis present

## 2021-12-14 DIAGNOSIS — E785 Hyperlipidemia, unspecified: Secondary | ICD-10-CM | POA: Diagnosis present

## 2021-12-14 DIAGNOSIS — H3530 Unspecified macular degeneration: Secondary | ICD-10-CM | POA: Diagnosis present

## 2021-12-14 DIAGNOSIS — Z79899 Other long term (current) drug therapy: Secondary | ICD-10-CM | POA: Diagnosis not present

## 2021-12-14 DIAGNOSIS — Z96653 Presence of artificial knee joint, bilateral: Secondary | ICD-10-CM | POA: Diagnosis present

## 2021-12-14 DIAGNOSIS — Z96611 Presence of right artificial shoulder joint: Secondary | ICD-10-CM | POA: Diagnosis present

## 2021-12-14 DIAGNOSIS — Z888 Allergy status to other drugs, medicaments and biological substances status: Secondary | ICD-10-CM | POA: Diagnosis not present

## 2021-12-14 DIAGNOSIS — Z885 Allergy status to narcotic agent status: Secondary | ICD-10-CM

## 2021-12-14 DIAGNOSIS — K3184 Gastroparesis: Secondary | ICD-10-CM | POA: Diagnosis present

## 2021-12-14 DIAGNOSIS — R739 Hyperglycemia, unspecified: Secondary | ICD-10-CM | POA: Diagnosis present

## 2021-12-14 DIAGNOSIS — K209 Esophagitis, unspecified without bleeding: Secondary | ICD-10-CM

## 2021-12-14 DIAGNOSIS — E876 Hypokalemia: Secondary | ICD-10-CM | POA: Diagnosis present

## 2021-12-14 DIAGNOSIS — I10 Essential (primary) hypertension: Secondary | ICD-10-CM | POA: Diagnosis present

## 2021-12-14 LAB — CBC
HCT: 38.2 % (ref 36.0–46.0)
HCT: 37.1 % (ref 36.0–46.0)
Hemoglobin: 12.2 g/dL (ref 12.0–15.0)
Hemoglobin: 12.2 g/dL (ref 12.0–15.0)
MCH: 29.5 pg (ref 26.0–34.0)
MCH: 30.2 pg (ref 26.0–34.0)
MCHC: 31.9 g/dL (ref 30.0–36.0)
MCHC: 32.9 g/dL (ref 30.0–36.0)
MCV: 92.3 fL (ref 80.0–100.0)
MCV: 91.8 fL (ref 80.0–100.0)
Platelets: 602 10*3/uL — ABNORMAL HIGH (ref 150–400)
Platelets: 562 10*3/uL — ABNORMAL HIGH (ref 150–400)
RBC: 4.14 MIL/uL (ref 3.87–5.11)
RBC: 4.04 MIL/uL (ref 3.87–5.11)
RDW: 12.8 % (ref 11.5–15.5)
RDW: 12.8 % (ref 11.5–15.5)
WBC: 26.3 10*3/uL — ABNORMAL HIGH (ref 4.0–10.5)
WBC: 25.1 10*3/uL — ABNORMAL HIGH (ref 4.0–10.5)
nRBC: 0 % (ref 0.0–0.2)
nRBC: 0 % (ref 0.0–0.2)

## 2021-12-14 LAB — CBC WITH DIFFERENTIAL/PLATELET
Abs Immature Granulocytes: 0.21 10*3/uL — ABNORMAL HIGH (ref 0.00–0.07)
Basophils Absolute: 0.1 10*3/uL (ref 0.0–0.1)
Basophils Relative: 0 %
Eosinophils Absolute: 0.1 10*3/uL (ref 0.0–0.5)
Eosinophils Relative: 0 %
HCT: 44.1 % (ref 36.0–46.0)
Hemoglobin: 14.7 g/dL (ref 12.0–15.0)
Immature Granulocytes: 1 %
Lymphocytes Relative: 3 %
Lymphs Abs: 0.7 10*3/uL (ref 0.7–4.0)
MCH: 30.2 pg (ref 26.0–34.0)
MCHC: 33.3 g/dL (ref 30.0–36.0)
MCV: 90.6 fL (ref 80.0–100.0)
Monocytes Absolute: 1.3 10*3/uL — ABNORMAL HIGH (ref 0.1–1.0)
Monocytes Relative: 5 %
Neutro Abs: 24.6 10*3/uL — ABNORMAL HIGH (ref 1.7–7.7)
Neutrophils Relative %: 91 %
Platelets: 695 10*3/uL — ABNORMAL HIGH (ref 150–400)
RBC: 4.87 MIL/uL (ref 3.87–5.11)
RDW: 12.6 % (ref 11.5–15.5)
WBC: 27 10*3/uL — ABNORMAL HIGH (ref 4.0–10.5)
nRBC: 0 % (ref 0.0–0.2)

## 2021-12-14 LAB — RESP PANEL BY RT-PCR (FLU A&B, COVID) ARPGX2
Influenza A by PCR: NEGATIVE
Influenza B by PCR: NEGATIVE
SARS Coronavirus 2 by RT PCR: NEGATIVE

## 2021-12-14 LAB — URINALYSIS, ROUTINE W REFLEX MICROSCOPIC
Bilirubin Urine: NEGATIVE
Glucose, UA: NEGATIVE mg/dL
Ketones, ur: NEGATIVE mg/dL
Nitrite: NEGATIVE
Protein, ur: 30 mg/dL — AB
Specific Gravity, Urine: <1.005 — ABNORMAL LOW (ref 1.005–1.030)
pH: 8.5 — ABNORMAL HIGH (ref 5.0–8.0)

## 2021-12-14 LAB — HEMOGLOBIN A1C
Hgb A1c MFr Bld: 5.7 % — ABNORMAL HIGH (ref 4.8–5.6)
Mean Plasma Glucose: 116.89 mg/dL

## 2021-12-14 LAB — MAGNESIUM: Magnesium: 2.2 mg/dL (ref 1.7–2.4)

## 2021-12-14 LAB — COMPREHENSIVE METABOLIC PANEL
ALT: 13 U/L (ref 0–44)
AST: 26 U/L (ref 15–41)
Albumin: 3.4 g/dL — ABNORMAL LOW (ref 3.5–5.0)
Alkaline Phosphatase: 120 U/L (ref 38–126)
Anion gap: 18 — ABNORMAL HIGH (ref 5–15)
BUN: 18 mg/dL (ref 8–23)
CO2: 39 mmol/L — ABNORMAL HIGH (ref 22–32)
Calcium: 9.3 mg/dL (ref 8.9–10.3)
Chloride: 85 mmol/L — ABNORMAL LOW (ref 98–111)
Creatinine, Ser: 0.97 mg/dL (ref 0.44–1.00)
GFR, Estimated: 54 mL/min — ABNORMAL LOW (ref >60)
Glucose, Bld: 161 mg/dL — ABNORMAL HIGH (ref 70–99)
Potassium: 3.2 mmol/L — ABNORMAL LOW (ref 3.5–5.1)
Sodium: 142 mmol/L (ref 135–145)
Total Bilirubin: 0.6 mg/dL (ref 0.3–1.2)
Total Protein: 8.1 g/dL (ref 6.5–8.1)

## 2021-12-14 LAB — OCCULT BLOOD GASTRIC / DUODENUM (SPECIMEN CUP)
Occult Blood, Gastric: POSITIVE — AB
pH, Gastric: 1

## 2021-12-14 LAB — LIPASE, BLOOD: Lipase: 24 U/L (ref 11–51)

## 2021-12-14 LAB — TROPONIN I (HIGH SENSITIVITY)
Troponin I (High Sensitivity): 9 ng/L (ref <18)
Troponin I (High Sensitivity): 13 ng/L (ref <18)

## 2021-12-14 LAB — URINALYSIS, MICROSCOPIC (REFLEX)
RBC / HPF: >50 RBC/hpf (ref 0–5)
WBC, UA: >50 WBC/hpf (ref 0–5)

## 2021-12-14 LAB — CBG MONITORING, ED: Glucose-Capillary: 126 mg/dL — ABNORMAL HIGH (ref 70–99)

## 2021-12-14 MED ORDER — IOHEXOL 300 MG/ML  SOLN
100.0000 mL | Freq: Once | INTRAMUSCULAR | Status: AC | PRN
  Administered 2021-12-14: 100 mL via INTRAVENOUS

## 2021-12-14 MED ORDER — ACETAMINOPHEN 325 MG PO TABS: 650.0000 mg | ORAL_TABLET | Freq: Four times a day (QID) | ORAL | Status: DC | PRN

## 2021-12-14 MED ORDER — SODIUM CHLORIDE 0.9 % IV BOLUS
500.0000 mL | Freq: Once | INTRAVENOUS | Status: AC
  Administered 2021-12-14: 500 mL via INTRAVENOUS

## 2021-12-14 MED ORDER — ONDANSETRON HCL 4 MG/2ML IJ SOLN
4.0000 mg | Freq: Four times a day (QID) | INTRAMUSCULAR | Status: DC | PRN
  Administered 2021-12-14: 4 mg via INTRAVENOUS
  Filled 2021-12-14: qty 2

## 2021-12-14 MED ORDER — FENTANYL CITRATE PF 50 MCG/ML IJ SOSY
50.0000 ug | PREFILLED_SYRINGE | Freq: Once | INTRAMUSCULAR | Status: AC
  Administered 2021-12-14: 50 ug via INTRAVENOUS
  Filled 2021-12-14: qty 1

## 2021-12-14 MED ORDER — KCL-LACTATED RINGERS 20 MEQ/L IV SOLN
INTRAVENOUS | Status: DC
  Filled 2021-12-14: qty 1000

## 2021-12-14 MED ORDER — ONDANSETRON HCL 4 MG/2ML IJ SOLN
4.0000 mg | Freq: Once | INTRAMUSCULAR | Status: AC
  Administered 2021-12-14: 4 mg via INTRAVENOUS
  Filled 2021-12-14: qty 2

## 2021-12-14 MED ORDER — POTASSIUM CHLORIDE 2 MEQ/ML IV SOLN
INTRAVENOUS | Status: AC
  Filled 2021-12-14: qty 1000

## 2021-12-14 MED ORDER — ACETAMINOPHEN 650 MG RE SUPP: 650.0000 mg | Freq: Four times a day (QID) | RECTAL | Status: DC | PRN

## 2021-12-14 MED ORDER — POTASSIUM CHLORIDE 2 MEQ/ML IV SOLN
INTRAVENOUS | Status: DC
  Filled 2021-12-14: qty 1000

## 2021-12-14 MED ORDER — HYDRALAZINE HCL 20 MG/ML IJ SOLN: 10.0000 mg | INTRAMUSCULAR | Status: DC | PRN

## 2021-12-14 MED ORDER — PANTOPRAZOLE SODIUM 40 MG IV SOLR
40.0000 mg | Freq: Two times a day (BID) | INTRAVENOUS | Status: DC
  Administered 2021-12-14 – 2021-12-16 (×4): 40 mg via INTRAVENOUS
  Filled 2021-12-14 (×4): qty 40

## 2021-12-14 MED ORDER — SODIUM CHLORIDE 0.9 % IV SOLN
2.0000 g | INTRAVENOUS | Status: DC
  Administered 2021-12-14 – 2021-12-16 (×3): 2 g via INTRAVENOUS
  Filled 2021-12-14 (×3): qty 20

## 2021-12-14 MED ORDER — POTASSIUM CHLORIDE CRYS ER 20 MEQ PO TBCR
40.0000 meq | EXTENDED_RELEASE_TABLET | Freq: Once | ORAL | Status: AC
  Administered 2021-12-14: 40 meq via ORAL
  Filled 2021-12-14: qty 2

## 2021-12-14 MED ORDER — SODIUM CHLORIDE 0.9 % IV SOLN
500.0000 mg | INTRAVENOUS | Status: DC
  Administered 2021-12-14 – 2021-12-16 (×3): 500 mg via INTRAVENOUS
  Filled 2021-12-14 (×3): qty 5

## 2021-12-14 MED ORDER — PANTOPRAZOLE SODIUM 40 MG IV SOLR
40.0000 mg | Freq: Once | INTRAVENOUS | Status: AC
  Administered 2021-12-14: 40 mg via INTRAVENOUS
  Filled 2021-12-14: qty 40

## 2021-12-14 NOTE — ED Notes (Signed)
Patient transported to CT 

## 2021-12-14 NOTE — ED Provider Notes (Signed)
[facility] EMERGENCY DEPARTMENT Provider Note   CSN: [id number] Arrival date & time: 12/14/21  1638     History  Chief Complaint  Patient presents with   Hematemesis    [name] is a 88 y.o. female.  HPI 88 year old female presents from her facility with vomiting.  The report from EMS to the nurse was that the patient has been vomiting a couple days and now it is turning black.  The patient tells me that she has been vomiting since 1/13.  She has been unable to keep anything down.  She is also having diffuse abdominal pain as well as some chest pain.  She thinks she has had a bowel movement recently but cannot member the exact last time.  Her throat is sore since she has started the copious emesis.  Home Medications Prior to Admission medications   Medication Sig Start Date End Date Taking? Authorizing Provider  acetaminophen (TYLENOL) 325 MG tablet Take 650 mg by mouth every 8 (eight) hours as needed for mild pain or moderate pain.   Yes [provider]  acetaminophen (TYLENOL) 500 MG tablet Take 1,000 mg by mouth 2 (two) times daily.   Yes [provider]  Alum & Mag Hydroxide-Simeth (GI COCKTAIL) SUSP suspension Take 30 mLs by mouth every 4 (four) hours as needed for indigestion. "Mylanta Coat-Cool 1,200-270-80 (GI Cocktail)"   Yes [provider]  alum & mag hydroxide-simeth (MAALOX/MYLANTA) 200-200-20 MG/5ML suspension Take 30 mLs by mouth every 4 (four) hours as needed for indigestion.   Yes [provider]  cetirizine (ZYRTEC) 10 MG tablet Take 10 mg by mouth daily as needed for allergies.   Yes [provider]  Cholecalciferol 25 MCG (1000 UT) tablet Take 1,000 Units by mouth daily.   Yes [provider]  clotrimazole (GYNE-LOTRIMIN) 1 % vaginal cream Place 1 Applicatorful vaginally 2 (two) times daily as needed (itching).   Yes [provider]  diclofenac Sodium (VOLTAREN) 1 % GEL Apply 2 g  topically 3 (three) times daily as needed (left shoulder pain).   Yes [provider]  docusate sodium (COLACE) 100 MG capsule Take 100 mg by mouth 2 (two) times daily as needed for mild constipation.   Yes [provider]  estradiol (ESTRACE) 0.1 MG/GM vaginal cream Place 0.5 g vaginally 3 (three) times a week. Monday, Wednesday, Friday. Administer at night.   Yes [provider]  fluticasone (FLONASE) 50 MCG/ACT nasal spray Place 1 spray into both nostrils 2 (two) times daily.   Yes [provider]  furosemide (LASIX) 20 MG tablet Take 20 mg by mouth daily.   Yes [provider]  gabapentin (NEURONTIN) 100 MG capsule Take 200 mg by mouth daily.   Yes [provider]  gabapentin (NEURONTIN) 300 MG capsule Take 300 mg by mouth at bedtime.   Yes [provider]  Infant Care Products [facility]) OINT Apply 1 application topically 2 (two) times daily. Apply to buttocks   Yes [provider]  ipratropium-albuterol (DUONEB) 0.5-2.5 (3) MG/3ML SOLN Take 3 mLs by nebulization every 6 (six) hours as needed (shortness of breath/wheezing).   Yes [provider]  Lidocaine HCl 4 % CREA Apply 1 application topically 2 (two) times daily. Apply to lower back for back pain   Yes [provider]  meclizine (ANTIVERT) 25 MG tablet Take 25 mg by mouth every 8 (eight) hours as needed for dizziness.   Yes [provider]  melatonin 3 MG TABS tablet Take 3 mg by mouth at bedtime.   Yes [provider]  Multiple Vitamins-Minerals (MULTIVITAMIN WITH MINERALS) tablet Take 1 tablet by mouth daily.   Yes [provider]  nystatin cream (MYCOSTATIN) Apply 1 application topically 2 (two) times daily as needed for dry skin. Apply to groin/buttocks   Yes [provider]  omeprazole (PRILOSEC) 40 MG capsule Take 40 mg by mouth daily.   Yes [provider]  ondansetron (ZOFRAN) 4 MG tablet Take 4 mg  by mouth every 6 (six) hours as needed for nausea or vomiting.   Yes [provider]  oxyCODONE (OXY IR/ROXICODONE) 5 MG immediate release tablet Take 5 mg by mouth 3 (three) times daily.   Yes [provider]  phenylephrine-shark liver oil-mineral oil-petrolatum (PREPARATION H) 0.25-14-74.9 % rectal ointment Place 1 application rectally 2 (two) times daily as needed for hemorrhoids.   Yes [provider]  polyethylene glycol (MIRALAX / GLYCOLAX) packet Take 17 g by mouth daily. 06/24/18  Yes Earnstine Regal, PA-C  Potassium Chloride ER 20 MEQ TBCR Take 40 mEq by mouth once.   Yes [provider]  promethazine (PHENERGAN) 12.5 MG tablet Take 12.5 mg by mouth every 6 (six) hours as needed for nausea or vomiting.   Yes [provider]  senna-docusate (SENOKOT-S) 8.6-50 MG tablet Take 1 tablet by mouth at bedtime.   Yes [provider]  senna-docusate (SENOKOT-S) 8.6-50 MG tablet Take 1 tablet by mouth 2 (two) times daily as needed for mild constipation or moderate constipation.   Yes [provider]  acetaminophen (TYLENOL) 160 MG/5ML solution Take 31.3 mLs (1,000 mg total) by mouth every 8 (eight) hours as needed. Patient not taking: Reported on 12/14/2021 06/24/18   Earnstine Regal, PA-C  cefTRIAXone (ROCEPHIN) 1 g injection Inject 1 g into the muscle daily. For 5 days Patient not taking: Reported on 12/14/2021    [provider]  Dextrose-Sodium Chloride (DEXTROSE 5 % AND 0.45% NACL) infusion Inject 80 mL/hr into the vein continuous. Infuse at 53mL/hr x2L and d/c when completed    [provider]  doxycycline (VIBRA-TABS) 100 MG tablet Take 100 mg by mouth 2 (two) times daily. For 7 days    [provider]  doxycycline (VIBRA-TABS) 100 MG tablet Take 100 mg by mouth 2 (two) times daily. For 5 days Patient not taking: Reported on 12/14/2021    [provider]  geriatric multivitamins-minerals  (ELDERTONIC/GEVRABON) LIQD Place 15 mLs into feeding tube daily. Patient not taking: Reported on 12/14/2021 06/24/18   Earnstine Regal, PA-C  Nutritional Supplements (FEEDING SUPPLEMENT, OSMOLITE 1.2 CAL,) LIQD Continue tube feeding at 55 ml/hr with adjustments per your dietician at the SNF Patient not taking: Reported on 07/17/2018 06/24/18   Earnstine Regal, PA-C  pantoprazole sodium (PROTONIX) 40 mg/20 mL PACK Place 20 mLs (40 mg total) into feeding tube 2 (two) times daily. Patient not taking: Reported on 12/14/2021 06/24/18 07/24/18  Earnstine Regal, PA-C      Allergies    Aspirin, Boniva [ibandronic acid], Morphine, Nitrofurantoin, Penicillins, and Sulfonamide derivatives    Review of Systems   Review of Systems  Respiratory:  Negative for shortness of breath.   Cardiovascular:  Positive for chest pain.  Gastrointestinal:  Positive for abdominal pain, nausea and vomiting.   Physical Exam Updated Vital Signs BP 135/73    Pulse 94    Temp (!) 97.5 F (36.4 C) (Oral)    Resp 17  Ht 5\' 1"  (1.549 m)    Wt 70.4 kg    SpO2 93%    BMI 29.33 kg/m  Physical Exam Vitals and nursing note reviewed.  Constitutional:      Appearance: She is well-developed. She is not diaphoretic.     Comments: Appears to not feel well and is moaning in discomfort  HENT:     Head: Normocephalic and atraumatic.     Mouth/Throat:     Comments: Patient has some dried black residue on her tongue, presumably from her most recent emesis Cardiovascular:     Rate and Rhythm: Regular rhythm. Tachycardia present.     Heart sounds: Normal heart sounds.     Comments: HR~100 Pulmonary:     Effort: Pulmonary effort is normal.     Breath sounds: Normal breath sounds.  Abdominal:     Palpations: Abdomen is soft.     Tenderness: There is generalized abdominal tenderness.     Hernia: A hernia is present. Hernia is present in the umbilical area.  Skin:    General: Skin is warm and dry.  Neurological:     Mental  Status: She is alert.    ED Results / Procedures / Treatments   Labs (all labs ordered are listed, but only abnormal results are displayed) Labs Reviewed  COMPREHENSIVE METABOLIC PANEL - Abnormal; Notable for the following components:      Result Value   Potassium 3.2 (*)    Chloride 85 (*)    CO2 39 (*)    Glucose, Bld 161 (*)    Albumin 3.4 (*)    GFR, Estimated 54 (*)    Anion gap 18 (*)    All other components within normal limits  CBC WITH DIFFERENTIAL/PLATELET - Abnormal; Notable for the following components:   WBC 27.0 (*)    Platelets 695 (*)    Neutro Abs 24.6 (*)    Monocytes Absolute 1.3 (*)    Abs Immature Granulocytes 0.21 (*)    All other components within normal limits  OCCULT BLOOD GASTRIC / DUODENUM (SPECIMEN CUP) - Abnormal; Notable for the following components:   Occult Blood, Gastric POSITIVE (*)    All other components within normal limits  RESP PANEL BY RT-PCR (FLU A&B, COVID) [id number]  CULTURE, BLOOD (ROUTINE X 2)  CULTURE, BLOOD (ROUTINE X 2)  LIPASE, BLOOD  URINALYSIS, ROUTINE W REFLEX MICROSCOPIC  TROPONIN I (HIGH SENSITIVITY)  TROPONIN I (HIGH SENSITIVITY)    EKG EKG Interpretation  Date/Time:  Sunday December 14 2021 09:58:10 EST Ventricular Rate:  93 PR Interval:  162 QRS Duration: 79 QT Interval:  405 QTC Calculation: 504 R Axis:   -51 Text Interpretation: Sinus rhythm Atrial premature complex LAD, consider LAFB or inferior infarct Low voltage, extremity and precordial leads Consider anterior infarct Nonspecific T abnormalities, lateral leads Prolonged QT interval Confirmed by Sherwood Gambler [phone]) on 12/14/2021 11:25:37 AM  Radiology CT CHEST ABDOMEN PELVIS W CONTRAST  Result Date: 12/14/2021 CLINICAL DATA:  Persistent vomiting for 3 days.  Abdominal pain. EXAM: CT CHEST, ABDOMEN, AND PELVIS WITH CONTRAST TECHNIQUE: Multidetector CT imaging of the chest, abdomen and pelvis was performed following the standard protocol during bolus  administration of intravenous contrast. RADIATION DOSE REDUCTION: This exam was performed according to the departmental dose-optimization program which includes automated exposure control, adjustment of the mA and/or kV according to patient size and/or use of iterative reconstruction technique. CONTRAST:  126mL OMNIPAQUE IOHEXOL 300 MG/ML  SOLN COMPARISON:  06/02/2018. FINDINGS: CT CHEST  FINDINGS Cardiovascular: Heart and inferior mediastinal structures shifted to the left. Heart is normal in size. No pericardial effusion. Right and left coronary artery calcifications. Great vessels are normal in caliber. Mild aortic atherosclerosis. Mediastinum/Nodes: Moderate sized hiatal hernia. Stomach extends through a relatively narrow defect at the esophageal hiatus, narrowing the herniated portion of the stomach to 3.4 cm. The esophagus above the hernia appears thick-walled and ill-defined. No mediastinal or hilar masses. No enlarged lymph nodes. Trachea is unremarkable. Lungs/Pleura: Partial lower lobe atelectasis bilaterally, left greater than right,. Mild opacity in the dependent upper lobes adjacent to the oblique fissures, also consistent with atelectasis. Remainder of the lungs is clear. No convincing pneumonia or pulmonary edema. Trace right pleural effusion. No pneumothorax. Musculoskeletal: Recent appearing fracture of the posterolateral right ninth rib. No other fractures. No bone lesions. CT ABDOMEN PELVIS FINDINGS Hepatobiliary: Liver normal in size. Vague low-density area, inferior aspect of segment 4 B, consistent with focal fatty infiltration. No liver masses. Gallbladder distended, otherwise unremarkable. Common bile duct measures 8-9 mm, increased compared to the prior CT. No evidence of a duct stone. No intrahepatic bile duct dilation. Pancreas: Partial fatty replacement of the pancreas. No pancreatic mass or inflammation. Spleen: Normal in size without focal abnormality. Adrenals/Urinary Tract: No adrenal  masses. Mild bilateral renal cortical thinning. Kidneys normal in position and orientation. Symmetric renal enhancement. Symmetric mild delay renal excrete mint. No renal mass or stone. No hydronephrosis. Ureters normal in course and in caliber. Bladder is unremarkable. Stomach/Bowel: Stomach below the hiatal hernia is moderately distended with non dependent air and dependent fluid. No wall thickening. No adjacent inflammation. Small bowel and colon are normal in caliber. No wall thickening. No bowel inflammation. Normal appendix. Vascular/Lymphatic: Aortic atherosclerosis. No aneurysm. No enlarged lymph nodes. Reproductive: Uterus and bilateral adnexa are unremarkable. Other: Fat containing paraumbilical hernia, unchanged from the prior CT. No bowel enters this. Hernia mesh consistent with previous inguinal herniorrhaphy is, stable. No ascites. Musculoskeletal: Thoracolumbar scoliosis. Diffuse skeletal demineralization. Mild chronic loss of vertebral body height at L5, stable from the prior CT. No acute fracture. No bone lesion. Left hip total arthroplasty appears well seated and aligned. Skeletal structures are diffusely demineralized. IMPRESSION: CHEST CT 1. Moderate-sized hiatal hernia. Esophagus above the hiatal hernia appears thick-walled with an ill-defined margin consistent with adjacent edema. Esophagitis suspected. 2. Left greater than right lung base opacities consistent with atelectasis. Trace right pleural effusion. 3. No convincing pneumonia.  No evidence of pulmonary edema. 4. Mild aortic atherosclerosis.  Coronary artery calcifications. ABDOMEN AND PELVIS CT 1. Moderate distention of the stomach without wall thickening, adjacent inflammation or evidence of obstruction. Possible gastro paresis. 2. No acute finding within the abdomen or pelvis. No evidence of bowel obstruction or inflammation. 3. Aortic atherosclerosis. 4. Stable paraumbilical fat containing hernia. Electronically Signed   By: Lajean Manes M.D.   On: 12/14/2021 11:31   DG Chest Portable 1 View  Result Date: 12/14/2021 CLINICAL DATA:  88 year old female with history of chest and abdominal pain. Vomiting. EXAM: PORTABLE CHEST 1 VIEW COMPARISON:  Chest x-ray 07/17/2018. FINDINGS: Bibasilar opacities (left-greater-than-right) which may reflect areas of atelectasis and/or consolidation. Blunting of the left costophrenic sulcus indicative of a small to moderate left pleural effusion. Probable small right pleural effusion also noted. No pneumothorax. No evidence of pulmonary edema. Heart size is normal. Upper mediastinal contours are within normal limits. Atherosclerotic calcifications are noted in the thoracic aorta. Severe calcifications of the mitral annulus. Status post right shoulder arthroplasty. IMPRESSION:  1. Low lung volumes with bibasilar areas of atelectasis and/or consolidation, with superimposed moderate left and small right pleural effusions. 2. Aortic atherosclerosis. 3. Severe calcifications of the mitral annulus. Electronically Signed   By: Vinnie Langton M.D.   On: 12/14/2021 09:22    Procedures Procedures    Medications Ordered in ED Medications  sodium chloride 0.9 % bolus 500 mL (has no administration in time range)  sodium chloride 0.9 % bolus 500 mL (0 mLs Intravenous Stopped 12/14/21 1233)  fentaNYL (SUBLIMAZE) injection 50 mcg (50 mcg Intravenous Given 12/14/21 1015)  ondansetron (ZOFRAN) injection 4 mg (4 mg Intravenous Given 12/14/21 1014)  pantoprazole (PROTONIX) injection 40 mg (40 mg Intravenous Given 12/14/21 1120)  iohexol (OMNIPAQUE) 300 MG/ML solution 100 mL (100 mLs Intravenous Contrast Given 12/14/21 1104)    ED Course/ Medical Decision Making/ A&P                           Medical Decision Making  Patient had some dark emesis on arrival that was Gastroccult positive and concerning for hematemesis.  She is doing better after some IV fentanyl, Zofran, and small bolus of fluids.  She is wanting  to try some ice chips.  Overall work-up seems to show a recurrent hiatal hernia with what appears to be esophagitis but no other obvious complications.  I have updated daughter at the bedside.  I have reviewed her most recent discharge summary from 2019.  This briefly showed that she had an EGD that showed hiatal hernia with possible torsion and she ended up having a hiatal hernia repair.  Labs have been reviewed and interpreted by myself which shows a significant leukocytosis and thrombocytopenia, unclear if this is reactive to vomiting as there is no obvious infection. Her metabolic panel shows some mild hypokalemia that will need to be repleted, as well as a small bump in her creatinine that is technically normal but higher than when last seen in 2019.  Her hemoglobin is also higher than before, I suspect from hemoconcentration and dehydration.  Troponin testings are normal.  Patient's CT images have been personally reviewed and interpreted and shows recurrent esophagitis and possible gastroparesis/distention of the stomach.  Discussed case with Dr. Paulita Fujita of gastroenterology, who will consult and agrees with IV Protonix but otherwise no emergent interventions.  Discussed case with Dr. Hal Hope who will admit.       Final Clinical Impression(s) / ED Diagnoses Final diagnoses:  Hematemesis with nausea  Esophagitis    Rx / DC Orders ED Discharge Orders     None         Sherwood Gambler, MD 12/14/21 1527

## 2021-12-14 NOTE — Assessment & Plan Note (Signed)
[id number]

## 2021-12-14 NOTE — ED Triage Notes (Signed)
Pt Bib EMS from [facility] for non stop vomiting since last Friday, this AM vomit turned coffee ground and pt c/o abd pain, pt has dx of GERD and hx of hematemesis, she also c/o throat pain from vomiting

## 2021-12-14 NOTE — Consult Note (Signed)
[facility] Consultation Note  Referring Provider: [facility] Primary Care Physician:  Deland Pretty, MD  Reason for Consultation:  coffee ground emesis  HPI: [name] is a 88 y.o. female we're asked to see for hematemesis.  For the past couple days, she's had nausea, vomiting, coffee ground emesis.  No abdominal pain.  No blood in stool.  Similar presentation few years ago, had endoscopy Dr. Alessandra Bevels showing paraesophageal hernia.   Past Medical History:  Diagnosis Date   Actinic keratosis    Allergic rhinitis    Back pain    Chronic constipation    Chronic diastolic CHF (congestive heart failure) ([place]) 06/03/2018   GERD (gastroesophageal reflux disease)    Hyperlipidemia    Hypertension    Hyponatremia    Leg edema    Macular degeneration    Mitral regurgitation    mild by echo 2003   Osteoarthritis    Osteoarthrosis    Osteoporosis    Tenosynovitis    myxomatous    Past Surgical History:  Procedure Laterality Date   CARPAL TUNNEL RELEASE     bilateral   ESOPHAGOGASTRODUODENOSCOPY (EGD) WITH PROPOFOL N/A 06/05/2018   Procedure: ESOPHAGOGASTRODUODENOSCOPY (EGD) WITH PROPOFOL;  Surgeon: Otis Brace, MD;  Location: [facility];  Service: Gastroenterology;  Laterality: N/A;   hernial repain     HIATAL HERNIA REPAIR N/A 06/07/2018   Procedure: LAPAROSCOPIC REPAIR OF HIATAL HERNIA;  Surgeon: Alphonsa Overall, MD;  Location: [place];  Service: General;  Laterality: N/A;   INGUINAL HERNIA REPAIR     laparoscopic preperitoneal repair of bilateral inguinal hernias   IR [facility] TUBE CHANGE  06/20/2018   IR [place] GASTRO/COLONIC TUBE PERCUT W/FLUORO  08/29/2018   LAPAROSCOPIC GASTROSTOMY N/A 06/07/2018   Procedure: LAPAROSCOPIC GASTROSTOMY;  Surgeon: Alphonsa Overall, MD;  Location: [place];  Service: General;  Laterality: N/A;   REPLACEMENT TOTAL KNEE BILATERAL     REVERSE SHOULDER ARTHROPLASTY Right 05/08/2014   Procedure: REVERSE SHOULDER ARTHROPLASTY RIGHT ;   Surgeon: Marin Shutter, MD;  Location: [place];  Service: Orthopedics;  Laterality: Right;   SYNOVECTOMY     of right long finger   tonisellectomy     tonisillectomy     TONSILLECTOMY      Prior to Admission medications   Medication Sig Start Date End Date Taking? Authorizing Provider  acetaminophen (TYLENOL) 325 MG tablet Take 650 mg by mouth every 8 (eight) hours as needed for mild pain or moderate pain.   Yes [provider]  acetaminophen (TYLENOL) 500 MG tablet Take 1,000 mg by mouth 2 (two) times daily.   Yes [provider]  Alum & Mag Hydroxide-Simeth (GI COCKTAIL) SUSP suspension Take 30 mLs by mouth every 4 (four) hours as needed for indigestion. "Mylanta Coat-Cool 1,200-270-80 (GI Cocktail)"   Yes [provider]  alum & mag hydroxide-simeth (MAALOX/MYLANTA) 200-200-20 MG/5ML suspension Take 30 mLs by mouth every 4 (four) hours as needed for indigestion.   Yes [provider]  cetirizine (ZYRTEC) 10 MG tablet Take 10 mg by mouth daily as needed for allergies.   Yes [provider]  Cholecalciferol 25 MCG (1000 UT) tablet Take 1,000 Units by mouth daily.   Yes [provider]  clotrimazole (GYNE-LOTRIMIN) 1 % vaginal cream Place 1 Applicatorful vaginally 2 (two) times daily as needed (itching).   Yes [provider]  diclofenac Sodium (VOLTAREN) 1 % GEL Apply 2 g topically 3 (three) times daily as needed (left shoulder pain).  Yes [provider]  docusate sodium (COLACE) 100 MG capsule Take 100 mg by mouth 2 (two) times daily as needed for mild constipation.   Yes [provider]  estradiol (ESTRACE) 0.1 MG/GM vaginal cream Place 0.5 g vaginally 3 (three) times a week. Monday, Wednesday, Friday. Administer at night.   Yes [provider]  fluticasone (FLONASE) 50 MCG/ACT nasal spray Place 1 spray into both nostrils 2 (two) times daily.   Yes [provider]  furosemide (LASIX) 20 MG  tablet Take 20 mg by mouth daily.   Yes [provider]  gabapentin (NEURONTIN) 100 MG capsule Take 200 mg by mouth daily.   Yes [provider]  gabapentin (NEURONTIN) 300 MG capsule Take 300 mg by mouth at bedtime.   Yes [provider]  Infant Care Products [facility]) OINT Apply 1 application topically 2 (two) times daily. Apply to buttocks   Yes [provider]  ipratropium-albuterol (DUONEB) 0.5-2.5 (3) MG/3ML SOLN Take 3 mLs by nebulization every 6 (six) hours as needed (shortness of breath/wheezing).   Yes [provider]  Lidocaine HCl 4 % CREA Apply 1 application topically 2 (two) times daily. Apply to lower back for back pain   Yes [provider]  meclizine (ANTIVERT) 25 MG tablet Take 25 mg by mouth every 8 (eight) hours as needed for dizziness.   Yes [provider]  melatonin 3 MG TABS tablet Take 3 mg by mouth at bedtime.   Yes [provider]  Multiple Vitamins-Minerals (MULTIVITAMIN WITH MINERALS) tablet Take 1 tablet by mouth daily.   Yes [provider]  nystatin cream (MYCOSTATIN) Apply 1 application topically 2 (two) times daily as needed for dry skin. Apply to groin/buttocks   Yes [provider]  omeprazole (PRILOSEC) 40 MG capsule Take 40 mg by mouth daily.   Yes [provider]  ondansetron (ZOFRAN) 4 MG tablet Take 4 mg by mouth every 6 (six) hours as needed for nausea or vomiting.   Yes [provider]  oxyCODONE (OXY IR/ROXICODONE) 5 MG immediate release tablet Take 5 mg by mouth 3 (three) times daily.   Yes [provider]  phenylephrine-shark liver oil-mineral oil-petrolatum (PREPARATION H) 0.25-14-74.9 % rectal ointment Place 1 application rectally 2 (two) times daily as needed for hemorrhoids.   Yes [provider]  polyethylene glycol (MIRALAX / GLYCOLAX) packet Take 17 g by mouth daily. 06/24/18  Yes Earnstine Regal, PA-C  Potassium Chloride  ER 20 MEQ TBCR Take 40 mEq by mouth once.   Yes [provider]  promethazine (PHENERGAN) 12.5 MG tablet Take 12.5 mg by mouth every 6 (six) hours as needed for nausea or vomiting.   Yes [provider]  senna-docusate (SENOKOT-S) 8.6-50 MG tablet Take 1 tablet by mouth at bedtime.   Yes [provider]  senna-docusate (SENOKOT-S) 8.6-50 MG tablet Take 1 tablet by mouth 2 (two) times daily as needed for mild constipation or moderate constipation.   Yes [provider]  acetaminophen (TYLENOL) 160 MG/5ML solution Take 31.3 mLs (1,000 mg total) by mouth every 8 (eight) hours as needed. Patient not taking: Reported on 12/14/2021 06/24/18   Earnstine Regal, PA-C  cefTRIAXone (ROCEPHIN) 1 g injection Inject 1 g into the muscle daily. For 5 days Patient not taking: Reported on 12/14/2021    [provider]  Dextrose-Sodium Chloride (DEXTROSE 5 % AND 0.45% NACL) infusion Inject 80 mL/hr into the vein continuous. Infuse at 70mL/hr x2L and d/c when  completed    [provider]  doxycycline (VIBRA-TABS) 100 MG tablet Take 100 mg by mouth 2 (two) times daily. For 7 days    [provider]  doxycycline (VIBRA-TABS) 100 MG tablet Take 100 mg by mouth 2 (two) times daily. For 5 days Patient not taking: Reported on 12/14/2021    [provider]  geriatric multivitamins-minerals (ELDERTONIC/GEVRABON) LIQD Place 15 mLs into feeding tube daily. Patient not taking: Reported on 12/14/2021 06/24/18   Earnstine Regal, PA-C  Nutritional Supplements (FEEDING SUPPLEMENT, OSMOLITE 1.2 CAL,) LIQD Continue tube feeding at 55 ml/hr with adjustments per your dietician at the SNF Patient not taking: Reported on 07/17/2018 06/24/18   Earnstine Regal, PA-C  pantoprazole sodium (PROTONIX) 40 mg/20 mL PACK Place 20 mLs (40 mg total) into feeding tube 2 (two) times daily. Patient not taking: Reported on 12/14/2021 06/24/18 07/24/18  Earnstine Regal, PA-C     Current Facility-Administered Medications  Medication Dose Route Frequency Provider Last Rate Last Admin   acetaminophen (TYLENOL) tablet 650 mg  650 mg Oral Q6H PRN Rise Patience, MD       Or   acetaminophen (TYLENOL) suppository 650 mg  650 mg Rectal Q6H PRN Rise Patience, MD       ondansetron [facility]) injection 4 mg  4 mg Intravenous Q6H PRN Rise Patience, MD       pantoprazole (PROTONIX) injection 40 mg  40 mg Intravenous Q12H Rise Patience, MD       potassium chloride SA (KLOR-CON M) CR tablet 40 mEq  40 mEq Oral Once Sherwood Gambler, MD       Current Outpatient Medications  Medication Sig Dispense Refill   acetaminophen (TYLENOL) 325 MG tablet Take 650 mg by mouth every 8 (eight) hours as needed for mild pain or moderate pain.     acetaminophen (TYLENOL) 500 MG tablet Take 1,000 mg by mouth 2 (two) times daily.     Alum & Mag Hydroxide-Simeth (GI COCKTAIL) SUSP suspension Take 30 mLs by mouth every 4 (four) hours as needed for indigestion. "Mylanta Coat-Cool 1,200-270-80 (GI Cocktail)"     alum & mag hydroxide-simeth (MAALOX/MYLANTA) 200-200-20 MG/5ML suspension Take 30 mLs by mouth every 4 (four) hours as needed for indigestion.     cetirizine (ZYRTEC) 10 MG tablet Take 10 mg by mouth daily as needed for allergies.     Cholecalciferol 25 MCG (1000 UT) tablet Take 1,000 Units by mouth daily.     clotrimazole (GYNE-LOTRIMIN) 1 % vaginal cream Place 1 Applicatorful vaginally 2 (two) times daily as needed (itching).     diclofenac Sodium (VOLTAREN) 1 % GEL Apply 2 g topically 3 (three) times daily as needed (left shoulder pain).     docusate sodium (COLACE) 100 MG capsule Take 100 mg by mouth 2 (two) times daily as needed for mild constipation.     estradiol (ESTRACE) 0.1 MG/GM vaginal cream Place 0.5 g vaginally 3 (three) times a week. Monday, Wednesday, Friday. Administer at night.     fluticasone (FLONASE) 50 MCG/ACT nasal spray Place 1 spray into both  nostrils 2 (two) times daily.     furosemide (LASIX) 20 MG tablet Take 20 mg by mouth daily.     gabapentin (NEURONTIN) 100 MG capsule Take 200 mg by mouth daily.     gabapentin (NEURONTIN) 300 MG capsule Take 300 mg by mouth at bedtime.     Infant Care Products [facility]) OINT Apply 1 application topically 2 (two) times daily. Apply to  buttocks     ipratropium-albuterol (DUONEB) 0.5-2.5 (3) MG/3ML SOLN Take 3 mLs by nebulization every 6 (six) hours as needed (shortness of breath/wheezing).     Lidocaine HCl 4 % CREA Apply 1 application topically 2 (two) times daily. Apply to lower back for back pain     meclizine (ANTIVERT) 25 MG tablet Take 25 mg by mouth every 8 (eight) hours as needed for dizziness.     melatonin 3 MG TABS tablet Take 3 mg by mouth at bedtime.     Multiple Vitamins-Minerals (MULTIVITAMIN WITH MINERALS) tablet Take 1 tablet by mouth daily.     nystatin cream (MYCOSTATIN) Apply 1 application topically 2 (two) times daily as needed for dry skin. Apply to groin/buttocks     omeprazole (PRILOSEC) 40 MG capsule Take 40 mg by mouth daily.     ondansetron (ZOFRAN) 4 MG tablet Take 4 mg by mouth every 6 (six) hours as needed for nausea or vomiting.     oxyCODONE (OXY IR/ROXICODONE) 5 MG immediate release tablet Take 5 mg by mouth 3 (three) times daily.     phenylephrine-shark liver oil-mineral oil-petrolatum (PREPARATION H) 0.25-14-74.9 % rectal ointment Place 1 application rectally 2 (two) times daily as needed for hemorrhoids.     polyethylene glycol (MIRALAX / GLYCOLAX) packet Take 17 g by mouth daily. 14 each 0   Potassium Chloride ER 20 MEQ TBCR Take 40 mEq by mouth once.     promethazine (PHENERGAN) 12.5 MG tablet Take 12.5 mg by mouth every 6 (six) hours as needed for nausea or vomiting.     senna-docusate (SENOKOT-S) 8.6-50 MG tablet Take 1 tablet by mouth at bedtime.     senna-docusate (SENOKOT-S) 8.6-50 MG tablet Take 1 tablet by mouth 2 (two) times daily as needed for mild  constipation or moderate constipation.     acetaminophen (TYLENOL) 160 MG/5ML solution Take 31.3 mLs (1,000 mg total) by mouth every 8 (eight) hours as needed. (Patient not taking: Reported on 12/14/2021) 120 mL 0   cefTRIAXone (ROCEPHIN) 1 g injection Inject 1 g into the muscle daily. For 5 days (Patient not taking: Reported on 12/14/2021)     Dextrose-Sodium Chloride (DEXTROSE 5 % AND 0.45% NACL) infusion Inject 80 mL/hr into the vein continuous. Infuse at 67mL/hr x2L and d/c when completed     doxycycline (VIBRA-TABS) 100 MG tablet Take 100 mg by mouth 2 (two) times daily. For 7 days     doxycycline (VIBRA-TABS) 100 MG tablet Take 100 mg by mouth 2 (two) times daily. For 5 days (Patient not taking: Reported on 12/14/2021)     geriatric multivitamins-minerals (ELDERTONIC/GEVRABON) LIQD Place 15 mLs into feeding tube daily. (Patient not taking: Reported on 12/14/2021) 450 mL 1   Nutritional Supplements (FEEDING SUPPLEMENT, OSMOLITE 1.2 CAL,) LIQD Continue tube feeding at 55 ml/hr with adjustments per your dietician at the SNF (Patient not taking: Reported on 07/17/2018) 1500 mL 0   pantoprazole sodium (PROTONIX) 40 mg/20 mL PACK Place 20 mLs (40 mg total) into feeding tube 2 (two) times daily. (Patient not taking: Reported on 12/14/2021) 60 each 1    Allergies as of 12/14/2021 - Review Complete 12/14/2021  Allergen Reaction Noted   Aspirin Other (See Comments) 01/15/2010   Boniva [ibandronic acid] Other (See Comments) 06/09/2012   Morphine Other (See Comments) 01/15/2010   Nitrofurantoin Other (See Comments)    Penicillins Other (See Comments) 01/15/2010   Sulfonamide derivatives Other (See Comments) 01/15/2010    Family History  Problem Relation Age  of Onset   Heart attack Father    Heart attack Brother     Social History   Socioeconomic History   Marital status: Divorced    Spouse name: Not on file   Number of children: 4   Years of education: Not on file   Highest education level: Not  on file  Occupational History   Not on file  Tobacco Use   Smoking status: Former    Packs/day: 1.00    Years: 2.00    Pack years: 2.00    Types: Cigarettes    Quit date: 12/07/1967    Years since quitting: 54.0   Smokeless tobacco: Never  Vaping Use   Vaping Use: Never used  Substance and Sexual Activity   Alcohol use: Not Currently    Alcohol/week: 2.0 standard drinks    Types: 2 Glasses of wine per week    Comment: 2 glasses per week   Drug use: No   Sexual activity: Never  Other Topics Concern   Not on file  Social History Narrative   Not on file   Social Determinants of Health   Financial Resource Strain: Not on file  Food Insecurity: Not on file  Transportation Needs: Not on file  Physical Activity: Not on file  Stress: Not on file  Social Connections: Not on file  Intimate Partner Violence: Not on file    Review of Systems: As per HPI, all others negative  Physical Exam: Vital signs in last 24 hours: Temp:  [97.5 F (36.4 C)] 97.5 F (36.4 C) (01/15 0815) Pulse Rate:  [92-104] 94 (01/15 1230) Resp:  [17-23] 17 (01/15 1230) BP: (132-137)/(73-86) 135/73 (01/15 1230) SpO2:  [93 %-97 %] 93 % (01/15 1230) Weight:  [70.4 kg] 70.4 kg (01/15 0817)   General:   Alert, elderly, Well-developed, well-nourished, pleasant and cooperative in NAD Head:  Normocephalic and atraumatic. Eyes:  Sclera clear, no icterus.   Conjunctiva pink. Ears:  Normal auditory acuity. Nose:  No deformity, discharge,  or lesions. Mouth:  Swollen lower lip with coffee ground material. Oropharynx pink & moist. Neck:  Supple; no masses or thyromegaly. Abdomen:  Soft, nontender and nondistended. No masses, hepatosplenomegaly or hernias noted. Normal bowel sounds, without guarding, and without rebound.     Msk:  Symmetrical without gross deformities. Normal posture. Pulses:  Normal pulses noted. Extremities:  Without clubbing or edema. Neurologic:  Alert and  oriented x4;  grossly normal  neurologically. Skin:  Intact without significant lesions or rashes. Psych:  Alert and cooperative. Normal mood and affect.   Lab Results: Recent Labs    12/14/21 0829 12/14/21 1321  WBC 27.0* 26.3*  HGB 14.7 12.2  HCT 44.1 38.2  PLT 695* 602*   BMET Recent Labs    12/14/21 0829  NA 142  K 3.2*  CL 85*  CO2 39*  GLUCOSE 161*  BUN 18  CREATININE 0.97  CALCIUM 9.3   LFT Recent Labs    12/14/21 0829  PROT 8.1  ALBUMIN 3.4*  AST 26  ALT 13  ALKPHOS 120  BILITOT 0.6   PT/INR No results for input(s): LABPROT, INR in the last 72 hours.  Studies/Results: CT CHEST ABDOMEN PELVIS W CONTRAST  Result Date: 12/14/2021 CLINICAL DATA:  Persistent vomiting for 3 days.  Abdominal pain. EXAM: CT CHEST, ABDOMEN, AND PELVIS WITH CONTRAST TECHNIQUE: Multidetector CT imaging of the chest, abdomen and pelvis was performed following the standard protocol during bolus administration of intravenous contrast. RADIATION DOSE REDUCTION:  This exam was performed according to the departmental dose-optimization program which includes automated exposure control, adjustment of the mA and/or kV according to patient size and/or use of iterative reconstruction technique. CONTRAST:  116mL OMNIPAQUE IOHEXOL 300 MG/ML  SOLN COMPARISON:  06/02/2018. FINDINGS: CT CHEST FINDINGS Cardiovascular: Heart and inferior mediastinal structures shifted to the left. Heart is normal in size. No pericardial effusion. Right and left coronary artery calcifications. Great vessels are normal in caliber. Mild aortic atherosclerosis. Mediastinum/Nodes: Moderate sized hiatal hernia. Stomach extends through a relatively narrow defect at the esophageal hiatus, narrowing the herniated portion of the stomach to 3.4 cm. The esophagus above the hernia appears thick-walled and ill-defined. No mediastinal or hilar masses. No enlarged lymph nodes. Trachea is unremarkable. Lungs/Pleura: Partial lower lobe atelectasis bilaterally, left greater  than right,. Mild opacity in the dependent upper lobes adjacent to the oblique fissures, also consistent with atelectasis. Remainder of the lungs is clear. No convincing pneumonia or pulmonary edema. Trace right pleural effusion. No pneumothorax. Musculoskeletal: Recent appearing fracture of the posterolateral right ninth rib. No other fractures. No bone lesions. CT ABDOMEN PELVIS FINDINGS Hepatobiliary: Liver normal in size. Vague low-density area, inferior aspect of segment 4 B, consistent with focal fatty infiltration. No liver masses. Gallbladder distended, otherwise unremarkable. Common bile duct measures 8-9 mm, increased compared to the prior CT. No evidence of a duct stone. No intrahepatic bile duct dilation. Pancreas: Partial fatty replacement of the pancreas. No pancreatic mass or inflammation. Spleen: Normal in size without focal abnormality. Adrenals/Urinary Tract: No adrenal masses. Mild bilateral renal cortical thinning. Kidneys normal in position and orientation. Symmetric renal enhancement. Symmetric mild delay renal excrete mint. No renal mass or stone. No hydronephrosis. Ureters normal in course and in caliber. Bladder is unremarkable. Stomach/Bowel: Stomach below the hiatal hernia is moderately distended with non dependent air and dependent fluid. No wall thickening. No adjacent inflammation. Small bowel and colon are normal in caliber. No wall thickening. No bowel inflammation. Normal appendix. Vascular/Lymphatic: Aortic atherosclerosis. No aneurysm. No enlarged lymph nodes. Reproductive: Uterus and bilateral adnexa are unremarkable. Other: Fat containing paraumbilical hernia, unchanged from the prior CT. No bowel enters this. Hernia mesh consistent with previous inguinal herniorrhaphy is, stable. No ascites. Musculoskeletal: Thoracolumbar scoliosis. Diffuse skeletal demineralization. Mild chronic loss of vertebral body height at L5, stable from the prior CT. No acute fracture. No bone lesion.  Left hip total arthroplasty appears well seated and aligned. Skeletal structures are diffusely demineralized. IMPRESSION: CHEST CT 1. Moderate-sized hiatal hernia. Esophagus above the hiatal hernia appears thick-walled with an ill-defined margin consistent with adjacent edema. Esophagitis suspected. 2. Left greater than right lung base opacities consistent with atelectasis. Trace right pleural effusion. 3. No convincing pneumonia.  No evidence of pulmonary edema. 4. Mild aortic atherosclerosis.  Coronary artery calcifications. ABDOMEN AND PELVIS CT 1. Moderate distention of the stomach without wall thickening, adjacent inflammation or evidence of obstruction. Possible gastro paresis. 2. No acute finding within the abdomen or pelvis. No evidence of bowel obstruction or inflammation. 3. Aortic atherosclerosis. 4. Stable paraumbilical fat containing hernia. Electronically Signed   By: Lajean Manes M.D.   On: 12/14/2021 11:31   DG Chest Portable 1 View  Result Date: 12/14/2021 CLINICAL DATA:  88 year old female with history of chest and abdominal pain. Vomiting. EXAM: PORTABLE CHEST 1 VIEW COMPARISON:  Chest x-ray 07/17/2018. FINDINGS: Bibasilar opacities (left-greater-than-right) which may reflect areas of atelectasis and/or consolidation. Blunting of the left costophrenic sulcus indicative of a small to moderate left pleural  effusion. Probable small right pleural effusion also noted. No pneumothorax. No evidence of pulmonary edema. Heart size is normal. Upper mediastinal contours are within normal limits. Atherosclerotic calcifications are noted in the thoracic aorta. Severe calcifications of the mitral annulus. Status post right shoulder arthroplasty. IMPRESSION: 1. Low lung volumes with bibasilar areas of atelectasis and/or consolidation, with superimposed moderate left and small right pleural effusions. 2. Aortic atherosclerosis. 3. Severe calcifications of the mitral annulus. Electronically Signed   By:  Vinnie Langton M.D.   On: 12/14/2021 09:22    Impression:   Coffee ground emesis.  Very likely from esophagitis versus Cameron's erosions from large complex hiatal/paraesophageal hernia.  Plan:   Sips clear liquids ok from GI perspective.  Minimize NSAIDs/anticoagulants.  PPI (pantoprazole 40 mg IV q 12 hours r equivalent). Gentle hydration, supportive care. No endoscopy at this point, and pending clinical course. [facility] will follow.   LOS: 0 days   Haziel Molner M  12/14/2021, 3:05 PM  Cell [phone] If no answer or after 5 PM call [phone]

## 2021-12-14 NOTE — H&P (Addendum)
History and Physical    [name] DVV:[id number] DOB: [birth year] DOA: 12/14/2021  PCP: Deland Pretty, MD  Patient coming from: Skilled nursing facility.  Chief Complaint: Nausea vomiting.  HPI: [name] is a 88 y.o. female with history of diastolic dysfunction, hypertension has been experiencing nausea vomiting for the last 2 days.  Has noticed dark yellow fluid when vomiting.  Denies any abdominal pain.  Has not umbilical hernia which has been chronic and does not look obstructed.  A week ago patient was treated for cough and shortness of breath for pneumonia.  Patient's leukocytosis has persistently remained elevated.  Was placed on doxycycline.  Patient does say that she has productive cough last few days.  Due to worsening nausea vomiting which has been persistent patient was brought to the ER.  ED Course: In the ER patient's labs show significant leukocytosis of 27,000 with high sensitive troponin of 9 and 13 EKG showing normal sinus rhythm low voltage potassium 3.20 blood glucose 161.  Patient underwent CT chest abdomen pelvis which shows features concerning for gastroparesis.  Occult blood done for the gastric fluid is positive.  ER physician discussed with on-call gastroenterology Dr. Paulita Fujita will be seeing patient in consult.  Patient admitted for nausea vomiting which could be from gastroparesis and possible GI bleed.  Blood cultures ordered since patient has significant leukocytosis.  Review of Systems: As per HPI, rest all negative.   Past Medical History:  Diagnosis Date   Actinic keratosis    Allergic rhinitis    Back pain    Chronic constipation    Chronic diastolic CHF (congestive heart failure) ([place]) 06/03/2018   GERD (gastroesophageal reflux disease)    Hyperlipidemia    Hypertension    Hyponatremia    Leg edema    Macular degeneration    Mitral regurgitation    mild by echo 2003   Osteoarthritis    Osteoarthrosis    Osteoporosis    Tenosynovitis     myxomatous    Past Surgical History:  Procedure Laterality Date   CARPAL TUNNEL RELEASE     bilateral   ESOPHAGOGASTRODUODENOSCOPY (EGD) WITH PROPOFOL N/A 06/05/2018   Procedure: ESOPHAGOGASTRODUODENOSCOPY (EGD) WITH PROPOFOL;  Surgeon: Otis Brace, MD;  Location: [facility];  Service: Gastroenterology;  Laterality: N/A;   hernial repain     HIATAL HERNIA REPAIR N/A 06/07/2018   Procedure: LAPAROSCOPIC REPAIR OF HIATAL HERNIA;  Surgeon: Alphonsa Overall, MD;  Location: [place];  Service: General;  Laterality: N/A;   INGUINAL HERNIA REPAIR     laparoscopic preperitoneal repair of bilateral inguinal hernias   IR [facility] TUBE CHANGE  06/20/2018   IR [facility] GASTRO/COLONIC TUBE PERCUT W/FLUORO  08/29/2018   LAPAROSCOPIC GASTROSTOMY N/A 06/07/2018   Procedure: LAPAROSCOPIC GASTROSTOMY;  Surgeon: Alphonsa Overall, MD;  Location: [place];  Service: General;  Laterality: N/A;   REPLACEMENT TOTAL KNEE BILATERAL     REVERSE SHOULDER ARTHROPLASTY Right 05/08/2014   Procedure: REVERSE SHOULDER ARTHROPLASTY RIGHT ;  Surgeon: Marin Shutter, MD;  Location: [place];  Service: Orthopedics;  Laterality: Right;   SYNOVECTOMY     of right long finger   tonisellectomy     tonisillectomy     TONSILLECTOMY       reports that she quit smoking about 54 years ago. Her smoking use included cigarettes. She has a 2.00 pack-year smoking history. She has never used smokeless tobacco. She reports that she does not currently use alcohol after a past usage of about  2.0 standard drinks per week. She reports that she does not use drugs.  Allergies  Allergen Reactions   Aspirin Other (See Comments)    Unknown - On MAR   Boniva [Ibandronic Acid] Other (See Comments)    Unknown - On MAR   Morphine Other (See Comments)    Unknown - On MAR   Nitrofurantoin Other (See Comments)    Unknown - On MAR   Penicillins Other (See Comments)    On MAR  Received Ancef without problem  Has patient had a PCN reaction causing immediate rash,  facial/tongue/throat swelling, SOB or lightheadedness with hypotension: Unknown Has patient had a PCN reaction causing severe rash involving mucus membranes or skin necrosis: Unknown Has patient had a PCN reaction that required hospitalization: Unknown Has patient had a PCN reaction occurring within the last 10 years: Unknown If all of the above answers are "NO", then may proceed with Cephalosporin use.   Sulfonamide Derivatives Other (See Comments)    Unknown - On MAR    Family History  Problem Relation Age of Onset   Heart attack Father    Heart attack Brother     Prior to Admission medications   Medication Sig Start Date End Date Taking? Authorizing Provider  acetaminophen (TYLENOL) 325 MG tablet Take 650 mg by mouth every 8 (eight) hours as needed for mild pain or moderate pain.   Yes [provider]  acetaminophen (TYLENOL) 500 MG tablet Take 1,000 mg by mouth 2 (two) times daily.   Yes [provider]  Alum & Mag Hydroxide-Simeth (GI COCKTAIL) SUSP suspension Take 30 mLs by mouth every 4 (four) hours as needed for indigestion. "Mylanta Coat-Cool 1,200-270-80 (GI Cocktail)"   Yes [provider]  alum & mag hydroxide-simeth (MAALOX/MYLANTA) 200-200-20 MG/5ML suspension Take 30 mLs by mouth every 4 (four) hours as needed for indigestion.   Yes [provider]  cetirizine (ZYRTEC) 10 MG tablet Take 10 mg by mouth daily as needed for allergies.   Yes [provider]  Cholecalciferol 25 MCG (1000 UT) tablet Take 1,000 Units by mouth daily.   Yes [provider]  clotrimazole (GYNE-LOTRIMIN) 1 % vaginal cream Place 1 Applicatorful vaginally 2 (two) times daily as needed (itching).   Yes [provider]  diclofenac Sodium (VOLTAREN) 1 % GEL Apply 2 g topically 3 (three) times daily as needed (left shoulder pain).   Yes [provider]  docusate sodium (COLACE) 100 MG capsule Take 100 mg by mouth 2 (two) times daily as  needed for mild constipation.   Yes [provider]  estradiol (ESTRACE) 0.1 MG/GM vaginal cream Place 0.5 g vaginally 3 (three) times a week. Monday, Wednesday, Friday. Administer at night.   Yes [provider]  fluticasone (FLONASE) 50 MCG/ACT nasal spray Place 1 spray into both nostrils 2 (two) times daily.   Yes [provider]  furosemide (LASIX) 20 MG tablet Take 20 mg by mouth daily.   Yes [provider]  gabapentin (NEURONTIN) 100 MG capsule Take 200 mg by mouth daily.   Yes [provider]  gabapentin (NEURONTIN) 300 MG capsule Take 300 mg by mouth at bedtime.   Yes [provider]  Infant Care Products [facility]) OINT Apply 1 application topically 2 (two) times daily. Apply to buttocks   Yes [provider]  ipratropium-albuterol (DUONEB) 0.5-2.5 (3) MG/3ML SOLN Take 3 mLs by nebulization every 6 (six) hours as needed (shortness of breath/wheezing).   Yes [provider]  Lidocaine HCl 4 % CREA Apply 1 application topically 2 (two) times daily. Apply to lower back for back pain   Yes [provider]  meclizine (ANTIVERT) 25 MG tablet Take 25 mg by mouth every 8 (eight) hours as needed for dizziness.   Yes [provider]  melatonin 3 MG TABS tablet Take 3 mg by mouth at bedtime.   Yes [provider]  Multiple Vitamins-Minerals (MULTIVITAMIN WITH MINERALS) tablet Take 1 tablet by mouth daily.   Yes [provider]  nystatin cream (MYCOSTATIN) Apply 1 application topically 2 (two) times daily as needed for dry skin. Apply to groin/buttocks   Yes [provider]  omeprazole (PRILOSEC) 40 MG capsule Take 40 mg by mouth daily.   Yes [provider]  ondansetron (ZOFRAN) 4 MG tablet Take 4 mg by mouth every 6 (six) hours as needed for nausea or vomiting.   Yes [provider]  oxyCODONE (OXY IR/ROXICODONE) 5 MG immediate release tablet Take 5 mg by mouth 3  (three) times daily.   Yes [provider]  phenylephrine-shark liver oil-mineral oil-petrolatum (PREPARATION H) 0.25-14-74.9 % rectal ointment Place 1 application rectally 2 (two) times daily as needed for hemorrhoids.   Yes [provider]  polyethylene glycol (MIRALAX / GLYCOLAX) packet Take 17 g by mouth daily. 06/24/18  Yes Earnstine Regal, PA-C  Potassium Chloride ER 20 MEQ TBCR Take 40 mEq by mouth once.   Yes [provider]  promethazine (PHENERGAN) 12.5 MG tablet Take 12.5 mg by mouth every 6 (six) hours as needed for nausea or vomiting.   Yes [provider]  senna-docusate (SENOKOT-S) 8.6-50 MG tablet Take 1 tablet by mouth at bedtime.   Yes [provider]  senna-docusate (SENOKOT-S) 8.6-50 MG tablet Take 1 tablet by mouth 2 (two) times daily as needed for mild constipation or moderate constipation.   Yes [provider]  acetaminophen (TYLENOL) 160 MG/5ML solution Take 31.3 mLs (1,000 mg total) by mouth every 8 (eight) hours as needed. Patient not taking: Reported on 12/14/2021 06/24/18   Earnstine Regal, PA-C  cefTRIAXone (ROCEPHIN) 1 g injection Inject 1 g into the muscle daily. For 5 days Patient not taking: Reported on 12/14/2021    [provider]  Dextrose-Sodium Chloride (DEXTROSE 5 % AND 0.45% NACL) infusion Inject 80 mL/hr into the vein continuous. Infuse at 76mL/hr x2L and d/c when completed    [provider]  doxycycline (VIBRA-TABS) 100 MG tablet Take 100 mg by mouth 2 (two) times daily. For 7 days    [provider]  doxycycline (VIBRA-TABS) 100 MG tablet Take 100 mg by mouth 2 (two) times daily. For 5 days Patient not taking: Reported on 12/14/2021    [provider]  geriatric multivitamins-minerals (ELDERTONIC/GEVRABON) LIQD Place 15 mLs into feeding tube daily. Patient not taking: Reported on 12/14/2021 06/24/18   Earnstine Regal, PA-C  Nutritional Supplements (FEEDING SUPPLEMENT,  OSMOLITE 1.2 CAL,) LIQD Continue tube feeding at 55 ml/hr with adjustments per your dietician at the SNF Patient not taking: Reported on 07/17/2018 06/24/18   Earnstine Regal, PA-C  pantoprazole sodium (PROTONIX) 40 mg/20 mL PACK Place 20 mLs (40 mg total) into feeding tube 2 (two) times daily. Patient not taking: Reported on 12/14/2021 06/24/18 07/24/18  Earnstine Regal, PA-C    Physical Exam: Constitutional: Moderately built and nourished. Vitals:   12/14/21 0930 12/14/21 1000 12/14/21 1115 12/14/21 1230  BP: 134/86 133/79 132/80 135/73  Pulse: 99 95 92 94  Resp: 17 (!) 23 19 17   Temp:      TempSrc:      SpO2: 95% 96% 97% 93%  Weight:      Height:       Eyes: Anicteric no pallor. ENMT: No discharge from the ears eyes nose and mouth. Neck: No mass felt.  No neck rigidity. Respiratory: No rhonchi or crepitations. Cardiovascular: S1-S2 heard. Abdomen: Soft nontender umbilical hernia seen. Musculoskeletal: No edema. Skin: No rash. Neurologic: Alert awake oriented to time place and person.  Moves all extremities. Psychiatric: Appears normal.  Normal affect.   Labs on Admission: I have personally reviewed following labs and imaging studies  CBC: Recent Labs  Lab 12/14/21 0829  WBC 27.0*  NEUTROABS 24.6*  HGB 14.7  HCT 44.1  MCV 90.6  PLT 242*   Basic Metabolic Panel: Recent Labs  Lab 12/14/21 0829  NA 142  K 3.2*  CL 85*  CO2 39*  GLUCOSE 161*  BUN 18  CREATININE 0.97  CALCIUM 9.3   GFR: Estimated Creatinine Clearance: 32.5 mL/min (by C-G formula based on SCr of 0.97 mg/dL). Liver Function Tests: Recent Labs  Lab 12/14/21 0829  AST 26  ALT 13  ALKPHOS 120  BILITOT 0.6  PROT 8.1  ALBUMIN 3.4*   Recent Labs  Lab 12/14/21 0829  LIPASE 24   No results for input(s): AMMONIA in the last 168 hours. Coagulation Profile: No results for input(s): INR, PROTIME in the last 168 hours. Cardiac Enzymes: No results for input(s): CKTOTAL, CKMB, CKMBINDEX,  TROPONINI in the last 168 hours. BNP (last 3 results) No results for input(s): PROBNP in the last 8760 hours. HbA1C: No results for input(s): HGBA1C in the last 72 hours. CBG: No results for input(s): GLUCAP in the last 168 hours. Lipid Profile: No results for input(s): CHOL, HDL, LDLCALC, TRIG, CHOLHDL, LDLDIRECT in the last 72 hours. Thyroid Function Tests: No results for input(s): TSH, T4TOTAL, FREET4, T3FREE, THYROIDAB in the last 72 hours. Anemia Panel: No results for input(s): VITAMINB12, FOLATE, FERRITIN, TIBC, IRON, RETICCTPCT in the last 72 hours. Urine analysis:    Component Value Date/Time   COLORURINE YELLOW 07/17/2018 1654   APPEARANCEUR CLOUDY (A) 07/17/2018 1654   LABSPEC 1.015 07/17/2018 1654   PHURINE 7.0 07/17/2018 1654   GLUCOSEU NEGATIVE 07/17/2018 1654   HGBUR NEGATIVE 07/17/2018 1654   BILIRUBINUR NEGATIVE 07/17/2018 1654   KETONESUR NEGATIVE 07/17/2018 1654   PROTEINUR 30 (A) 07/17/2018 1654   UROBILINOGEN 0.2 05/05/2014 2014   NITRITE NEGATIVE 07/17/2018 1654   LEUKOCYTESUR LARGE (A) 07/17/2018 1654   Sepsis Labs: @LABRCNTIP (procalcitonin:4,lacticidven:4) ) Recent Results (from the past 240 hour(s))  Resp Panel by RT-PCR (Flu A&B, Covid) Nasopharyngeal Swab     Status: None   Collection Time: 12/14/21  9:57 AM   Specimen: Nasopharyngeal Swab; Nasopharyngeal(NP) swabs in vial transport medium  Result Value Ref Range Status   SARS Coronavirus 2 by RT PCR NEGATIVE NEGATIVE Final    Comment: (NOTE) SARS-CoV-2 target nucleic acids are NOT DETECTED.  The SARS-CoV-2 RNA is generally detectable in upper respiratory specimens during the acute phase of infection. The lowest concentration of SARS-CoV-2 viral copies this assay can detect is 138 copies/mL. A negative result does not preclude SARS-Cov-2 infection and should not be used as the sole basis for treatment or other patient management decisions. A negative result may occur with  improper specimen  collection/handling, submission of specimen other than nasopharyngeal swab, presence of viral mutation(s) within the areas targeted by this  assay, and inadequate number of viral copies(<138 copies/mL). A negative result must be combined with clinical observations, patient history, and epidemiological information. The expected result is Negative.  Fact Sheet for Patients:  EntrepreneurPulse.com.au  Fact Sheet for Healthcare Providers:  IncredibleEmployment.be  This test is no t yet approved or cleared by the [place] FDA and  has been authorized for detection and/or diagnosis of SARS-CoV-2 by FDA under an Emergency Use Authorization (EUA). This EUA will remain  in effect (meaning this test can be used) for the duration of the COVID-19 declaration under Section 564(b)(1) of the Act, 21 U.S.C.section 360bbb-3(b)(1), unless the authorization is terminated  or revoked sooner.       Influenza A by PCR NEGATIVE NEGATIVE Final   Influenza B by PCR NEGATIVE NEGATIVE Final    Comment: (NOTE) The Xpert Xpress SARS-CoV-2/FLU/RSV plus assay is intended as an aid in the diagnosis of influenza from Nasopharyngeal swab specimens and should not be used as a sole basis for treatment. Nasal washings and aspirates are unacceptable for Xpert Xpress SARS-CoV-2/FLU/RSV testing.  Fact Sheet for Patients: EntrepreneurPulse.com.au  Fact Sheet for Healthcare Providers: IncredibleEmployment.be  This test is not yet approved or cleared by the [place] FDA and has been authorized for detection and/or diagnosis of SARS-CoV-2 by FDA under an Emergency Use Authorization (EUA). This EUA will remain in effect (meaning this test can be used) for the duration of the COVID-19 declaration under Section 564(b)(1) of the Act, 21 U.S.C. section 360bbb-3(b)(1), unless the authorization is terminated or revoked.  Performed at [facility], [place] [address] [place], [place] [address]      Radiological Exams on Admission: CT CHEST ABDOMEN PELVIS W CONTRAST  Result Date: 12/14/2021 CLINICAL DATA:  Persistent vomiting for 3 days.  Abdominal pain. EXAM: CT CHEST, ABDOMEN, AND PELVIS WITH CONTRAST TECHNIQUE: Multidetector CT imaging of the chest, abdomen and pelvis was performed following the standard protocol during bolus administration of intravenous contrast. RADIATION DOSE REDUCTION: This exam was performed according to the departmental dose-optimization program which includes automated exposure control, adjustment of the mA and/or kV according to patient size and/or use of iterative reconstruction technique. CONTRAST:  116mL OMNIPAQUE IOHEXOL 300 MG/ML  SOLN COMPARISON:  06/02/2018. FINDINGS: CT CHEST FINDINGS Cardiovascular: Heart and inferior mediastinal structures shifted to the left. Heart is normal in size. No pericardial effusion. Right and left coronary artery calcifications. Great vessels are normal in caliber. Mild aortic atherosclerosis. Mediastinum/Nodes: Moderate sized hiatal hernia. Stomach extends through a relatively narrow defect at the esophageal hiatus, narrowing the herniated portion of the stomach to 3.4 cm. The esophagus above the hernia appears thick-walled and ill-defined. No mediastinal or hilar masses. No enlarged lymph nodes. Trachea is unremarkable. Lungs/Pleura: Partial lower lobe atelectasis bilaterally, left greater than right,. Mild opacity in the dependent upper lobes adjacent to the oblique fissures, also consistent with atelectasis. Remainder of the lungs is clear. No convincing pneumonia or pulmonary edema. Trace right pleural effusion. No pneumothorax. Musculoskeletal: Recent appearing fracture of the posterolateral right ninth rib. No other fractures. No bone lesions. CT ABDOMEN PELVIS FINDINGS Hepatobiliary: Liver normal in size. Vague low-density area, inferior aspect of segment 4 B,  consistent with focal fatty infiltration. No liver masses. Gallbladder distended, otherwise unremarkable. Common bile duct measures 8-9 mm, increased compared to the prior CT. No evidence of a duct stone. No intrahepatic bile duct dilation. Pancreas: Partial fatty replacement of the pancreas. No pancreatic mass or inflammation. Spleen: Normal in size without  focal abnormality. Adrenals/Urinary Tract: No adrenal masses. Mild bilateral renal cortical thinning. Kidneys normal in position and orientation. Symmetric renal enhancement. Symmetric mild delay renal excrete mint. No renal mass or stone. No hydronephrosis. Ureters normal in course and in caliber. Bladder is unremarkable. Stomach/Bowel: Stomach below the hiatal hernia is moderately distended with non dependent air and dependent fluid. No wall thickening. No adjacent inflammation. Small bowel and colon are normal in caliber. No wall thickening. No bowel inflammation. Normal appendix. Vascular/Lymphatic: Aortic atherosclerosis. No aneurysm. No enlarged lymph nodes. Reproductive: Uterus and bilateral adnexa are unremarkable. Other: Fat containing paraumbilical hernia, unchanged from the prior CT. No bowel enters this. Hernia mesh consistent with previous inguinal herniorrhaphy is, stable. No ascites. Musculoskeletal: Thoracolumbar scoliosis. Diffuse skeletal demineralization. Mild chronic loss of vertebral body height at L5, stable from the prior CT. No acute fracture. No bone lesion. Left hip total arthroplasty appears well seated and aligned. Skeletal structures are diffusely demineralized. IMPRESSION: CHEST CT 1. Moderate-sized hiatal hernia. Esophagus above the hiatal hernia appears thick-walled with an ill-defined margin consistent with adjacent edema. Esophagitis suspected. 2. Left greater than right lung base opacities consistent with atelectasis. Trace right pleural effusion. 3. No convincing pneumonia.  No evidence of pulmonary edema. 4. Mild aortic  atherosclerosis.  Coronary artery calcifications. ABDOMEN AND PELVIS CT 1. Moderate distention of the stomach without wall thickening, adjacent inflammation or evidence of obstruction. Possible gastro paresis. 2. No acute finding within the abdomen or pelvis. No evidence of bowel obstruction or inflammation. 3. Aortic atherosclerosis. 4. Stable paraumbilical fat containing hernia. Electronically Signed   By: Lajean Manes M.D.   On: 12/14/2021 11:31   DG Chest Portable 1 View  Result Date: 12/14/2021 CLINICAL DATA:  88 year old female with history of chest and abdominal pain. Vomiting. EXAM: PORTABLE CHEST 1 VIEW COMPARISON:  Chest x-ray 07/17/2018. FINDINGS: Bibasilar opacities (left-greater-than-right) which may reflect areas of atelectasis and/or consolidation. Blunting of the left costophrenic sulcus indicative of a small to moderate left pleural effusion. Probable small right pleural effusion also noted. No pneumothorax. No evidence of pulmonary edema. Heart size is normal. Upper mediastinal contours are within normal limits. Atherosclerotic calcifications are noted in the thoracic aorta. Severe calcifications of the mitral annulus. Status post right shoulder arthroplasty. IMPRESSION: 1. Low lung volumes with bibasilar areas of atelectasis and/or consolidation, with superimposed moderate left and small right pleural effusions. 2. Aortic atherosclerosis. 3. Severe calcifications of the mitral annulus. Electronically Signed   By: Vinnie Langton M.D.   On: 12/14/2021 09:22    EKG: Independently reviewed.  Normal sinus rhythm low voltage.  Assessment/Plan Principal Problem:   Acute GI bleeding Active Problems:   Essential hypertension, benign   Leucocytosis   Gastroparesis    Nausea vomiting with CT scan showing features concerning for possible gastroparesis.  We will keep patient n.p.o. avoid narcotics if possible and gentle hydration correct electrolytes and order KUB in the morning.   Gastroenterologist Dr. Paulita Fujita has been consulted.  We will follow the recommendations. Possible GI bleed with patient throwing up dark fluid.  Will place patient on Protonix IV for now and check serial CBC transfuse if hemoglobin less than 7 or patient becomes hypotensive.  GI has been consulted. Significant leukocytosis with patient recently being treated for pneumonia with patient still having persistent cough productive of sputum suspect patient may be aspirating.  Given that patient has significant leukocytosis I have ordered blood cultures we will keep patient on antibiotics get speech therapy evaluation.  UA is  pending. Prior history of hypertension presently not on any medication we will keep patient on as needed IV hydralazine and follow blood pressure trends. History of diastolic CHF with the last EF measured in 2019 was 55 to 60% with grade 1 diastolic dysfunction.  Closely monitor respiratory status since patient is getting fluids. Umbilical hernia containing fat which has been chronic and is seen in the CAT scan which appears to be stable. Patient has been bedbound for a long time since 2019. Hyperglycemia -check hemoglobin A1c with next blood draw. Hypokalemia -potassium has been added to the fluids.  Follow metabolic panel check magnesium with next blood draw. Patient does use chronically pain medications.  We will try to minimize given that patient possibly has gastroparesis.   DVT prophylaxis: SCDs.  Avoiding anticoagulation suspecting GI bleed. Code Status: DNR confirmed with patient's daughter at the bedside. Family Communication: Patient's daughter. Disposition Plan: Back to facility when stable. Consults called: Gastroenterologist. Admission status: Observation.   Rise Patience MD [facility] Pager [phone].  If 7PM-7AM, please contact night-coverage [email or web address] Password [email or web address]  12/14/2021, 1:19 PM

## 2021-12-15 LAB — CBG MONITORING, ED
Glucose-Capillary: 114 mg/dL — ABNORMAL HIGH (ref 70–99)
Glucose-Capillary: 95 mg/dL (ref 70–99)
Glucose-Capillary: 91 mg/dL (ref 70–99)

## 2021-12-15 LAB — HEPATIC FUNCTION PANEL
ALT: 11 U/L (ref 0–44)
AST: 17 U/L (ref 15–41)
Albumin: 2.6 g/dL — ABNORMAL LOW (ref 3.5–5.0)
Alkaline Phosphatase: 82 U/L (ref 38–126)
Bilirubin, Direct: 0.1 mg/dL (ref 0.0–0.2)
Indirect Bilirubin: 0.4 mg/dL (ref 0.3–0.9)
Total Bilirubin: 0.5 mg/dL (ref 0.3–1.2)
Total Protein: 6.1 g/dL — ABNORMAL LOW (ref 6.5–8.1)

## 2021-12-15 LAB — BASIC METABOLIC PANEL
Anion gap: 7 (ref 5–15)
BUN: 17 mg/dL (ref 8–23)
CO2: 37 mmol/L — ABNORMAL HIGH (ref 22–32)
Calcium: 8.3 mg/dL — ABNORMAL LOW (ref 8.9–10.3)
Chloride: 96 mmol/L — ABNORMAL LOW (ref 98–111)
Creatinine, Ser: 0.87 mg/dL (ref 0.44–1.00)
GFR, Estimated: >60 mL/min (ref >60)
Glucose, Bld: 115 mg/dL — ABNORMAL HIGH (ref 70–99)
Potassium: 2.8 mmol/L — ABNORMAL LOW (ref 3.5–5.1)
Sodium: 140 mmol/L (ref 135–145)

## 2021-12-15 MED ORDER — POTASSIUM CHLORIDE 10 MEQ/100ML IV SOLN
10.0000 meq | Freq: Once | INTRAVENOUS | Status: AC
  Administered 2021-12-15: 10 meq via INTRAVENOUS
  Filled 2021-12-15: qty 100

## 2021-12-15 MED ORDER — POTASSIUM CHLORIDE 10 MEQ/100ML IV SOLN
10.0000 meq | INTRAVENOUS | Status: DC
  Administered 2021-12-15 (×5): 10 meq via INTRAVENOUS
  Filled 2021-12-15 (×5): qty 100

## 2021-12-15 NOTE — Progress Notes (Signed)
[facility] Progress Note  [name] 88 y.o. 08/29/1926  CC: Coffee-ground emesis   Subjective: Patient seen and examined at bedside.  She denies any further bleeding episodes.  ROS : Positive for weakness.   Objective: Vital signs in last 24 hours: Vitals:   12/15/21 1015 12/15/21 1354  BP: (!) 158/70 121/69  Pulse: 100 84  Resp: 18 16  Temp:    SpO2: 97% 93%    Physical Exam:  General.  Elderly patient, not in acute distress Abdomen : Soft, mild distended, nontender, bowel sounds present.  No peritoneal signs Neuro : Alert and oriented Psych : Somewhat anxious.   Lab Results: Recent Labs    12/14/21 0829 12/14/21 1721 12/15/21 0418  NA 142  --  140  K 3.2*  --  2.8*  CL 85*  --  96*  CO2 39*  --  37*  GLUCOSE 161*  --  115*  BUN 18  --  17  CREATININE 0.97  --  0.87  CALCIUM 9.3  --  8.3*  MG  --  2.2  --    Recent Labs    12/14/21 0829 12/15/21 0418  AST 26 17  ALT 13 11  ALKPHOS 120 82  BILITOT 0.6 0.5  PROT 8.1 6.1*  ALBUMIN 3.4* 2.6*   Recent Labs    12/14/21 0829 12/14/21 1321 12/14/21 1721  WBC 27.0* 26.3* 25.1*  NEUTROABS 24.6*  --   --   HGB 14.7 12.2 12.2  HCT 44.1 38.2 37.1  MCV 90.6 92.3 91.8  PLT 695* 602* 562*   No results for input(s): LABPROT, INR in the last 72 hours.    Assessment/Plan: -Coffee-ground emesis is in setting of moderate-sized hiatal hernia with questionable wall thickening of the esophagus seen on recent CT scan.  EGD in  2019 showed large complex paraesophageal hernia.  Recommendation ------------------------- -Continue supportive care with IV twice daily PPI for now.  Recommend at least 4 weeks of twice daily PPI after discharge followed by Protonix 40 mg once a day indefinitely -Okay to advance diet as tolerated from GI standpoint -No further inpatient GI work-up planned at this time.  Call us back if any evidence of ongoing bleeding or significant drop in hemoglobin.   Otis Brace MD, [place] 12/15/2021, 2:22 PM  Contact #  [phone]

## 2021-12-15 NOTE — Evaluation (Signed)
Clinical/Bedside Swallow Evaluation Patient Details  Name: [name] MRN: [id number] Date of Birth: January 01, 1926  Today's Date: 12/15/2021 Time: SLP Start Time (ACUTE ONLY): 0947 SLP Stop Time (ACUTE ONLY): 1015 SLP Time Calculation (min) (ACUTE ONLY): 28 min  Past Medical History:  Past Medical History:  Diagnosis Date   Actinic keratosis    Allergic rhinitis    Back pain    Chronic constipation    Chronic diastolic CHF (congestive heart failure) ([place]) 06/03/2018   GERD (gastroesophageal reflux disease)    Hyperlipidemia    Hypertension    Hyponatremia    Leg edema    Macular degeneration    Mitral regurgitation    mild by echo 2003   Osteoarthritis    Osteoarthrosis    Osteoporosis    Tenosynovitis    myxomatous   Past Surgical History:  Past Surgical History:  Procedure Laterality Date   CARPAL TUNNEL RELEASE     bilateral   ESOPHAGOGASTRODUODENOSCOPY (EGD) WITH PROPOFOL N/A 06/05/2018   Procedure: ESOPHAGOGASTRODUODENOSCOPY (EGD) WITH PROPOFOL;  Surgeon: Otis Brace, MD;  Location: [facility];  Service: Gastroenterology;  Laterality: N/A;   hernial repain     HIATAL HERNIA REPAIR N/A 06/07/2018   Procedure: LAPAROSCOPIC REPAIR OF HIATAL HERNIA;  Surgeon: Alphonsa Overall, MD;  Location: [place];  Service: General;  Laterality: N/A;   INGUINAL HERNIA REPAIR     laparoscopic preperitoneal repair of bilateral inguinal hernias   IR [facility] TUBE CHANGE  06/20/2018   IR [place] GASTRO/COLONIC TUBE PERCUT W/FLUORO  08/29/2018   LAPAROSCOPIC GASTROSTOMY N/A 06/07/2018   Procedure: LAPAROSCOPIC GASTROSTOMY;  Surgeon: Alphonsa Overall, MD;  Location: [place];  Service: General;  Laterality: N/A;   REPLACEMENT TOTAL KNEE BILATERAL     REVERSE SHOULDER ARTHROPLASTY Right 05/08/2014   Procedure: REVERSE SHOULDER ARTHROPLASTY RIGHT ;  Surgeon: Marin Shutter, MD;  Location: [place];  Service: Orthopedics;  Laterality: Right;   SYNOVECTOMY     of right long finger   tonisellectomy      tonisillectomy     TONSILLECTOMY     HPI:  88 y.o. female with history of diastolic dysfunction, hypertension has been experiencing nausea vomiting for the last 2 days.  Has noticed dark yellow fluid when vomiting.  Denies any abdominal pain.  Has not umbilical hernia which has been chronic and does not look obstructed.  A week ago patient was treated for cough and shortness of breath for pneumonia.  Patient's leukocytosis has persistently remained elevated.  Was placed on doxycycline.  Patient does say that she has productive cough last few days.  Due to worsening nausea vomiting which has been persistent patient was brought to the ER. Daughter stated she has been observed at [facility] for swallowing in the past.  BSE generated.   Assessment / Plan / Recommendation  Clinical Impression  Pt presented with pharyngoesophageal phase dysphagia characterized by immediate cough with larger volumes of thin liquids via straw, but improved with cup sips and min verbal cues provided for safe consumption.  Pt with dry oral mucosa and chapped/swollen lips d/t recent vomiting/nausea and limited oral intake and impacted oral phase of the swallow during study.  Pt expelled portion of the solid consistency from oral cavity, but was able to consume puree without difficulty.  Pt with hx of esophagitis/large complex hiatal hernia per chart review.  Pt noted having "her esophagus stretched" in the past and daughter confirmed this information.  Recent deconditioning and extensive esophageal hx places pt at  a mild-mod risk for aspiration without use of esophageal/aspiration precautions during PO intake.  Recommend initiating a Dysphagia 3 (mechanical soft)/thin liquid diet as tolerated with ST f/u during acute stay and/or at SNF when pt is d/c for diet tolerance.  Discussed potential outpatient MBS if symptoms persist/recurrent PNA evolves.  SLP Visit Diagnosis: Dysphagia, pharyngoesophageal phase (R13.14)    Aspiration  Risk  Mild aspiration risk    Diet Recommendation   Dysphagia 3 (mechanical/soft)/thin liquids  Medication Administration: Whole meds with puree    Other  Recommendations Recommended Consults: Consider esophageal assessment;Consider GI evaluation Oral Care Recommendations: Oral care QID    Recommendations for follow up therapy are one component of a multi-disciplinary discharge planning process, led by the attending physician.  Recommendations may be updated based on patient status, additional functional criteria and insurance authorization.  Follow up Recommendations Skilled nursing-short term rehab (<3 hours/day)      Assistance Recommended at Discharge Frequent or constant Supervision/Assistance  Functional Status Assessment Patient has had a recent decline in their functional status and demonstrates the ability to make significant improvements in function in a reasonable and predictable amount of time.  Frequency and Duration min 1 x/week  1 week       Prognosis Prognosis for Safe Diet Advancement: Good      Swallow Study   General Date of Onset: 12/14/21 HPI: 88 y.o. female with history of diastolic dysfunction, hypertension has been experiencing nausea vomiting for the last 2 days.  Has noticed dark yellow fluid when vomiting.  Denies any abdominal pain.  Has not umbilical hernia which has been chronic and does not look obstructed.  A week ago patient was treated for cough and shortness of breath for pneumonia.  Patient's leukocytosis has persistently remained elevated.  Was placed on doxycycline.  Patient does say that she has productive cough last few days.  Due to worsening nausea vomiting which has been persistent patient was brought to the ER. Type of Study: Bedside Swallow Evaluation Previous Swallow Assessment: n/a Diet Prior to this Study: NPO Temperature Spikes Noted: No Respiratory Status: Nasal cannula (3L) History of Recent Intubation: No Behavior/Cognition:  Alert;Cooperative;Requires cueing;Other (Comment) (HOH) Oral Cavity Assessment: Dry Oral Care Completed by SLP: Yes Oral Cavity - Dentition: Dentures, top;Dentures, bottom Vision: Functional for self-feeding Self-Feeding Abilities: Able to feed self;Needs assist Patient Positioning: Upright in bed Baseline Vocal Quality: Low vocal intensity Volitional Cough: Strong Volitional Swallow: Able to elicit    Oral/Motor/Sensory Function Overall Oral Motor/Sensory Function: Generalized oral weakness   Ice Chips Ice chips: Within functional limits Presentation: Spoon   Thin Liquid Thin Liquid: Impaired Presentation: Cup;Straw;Spoon Pharyngeal  Phase Impairments: Suspected delayed Swallow;Throat Clearing - Delayed;Cough - Immediate    Nectar Thick Nectar Thick Liquid: Not tested   Honey Thick Honey Thick Liquid: Not tested   Puree Puree: Within functional limits Presentation: Self Fed   Solid     Solid: Impaired Presentation: Self Fed Oral Phase Functional Implications: Prolonged oral transit Pharyngeal Phase Impairments: Cough - Delayed Other Comments: removed most of dry cracker from oral cavity via suctioning      Elvina Sidle, M.S., CCC-SLP 12/15/2021,10:31 AM

## 2021-12-15 NOTE — Progress Notes (Signed)
History and Physical    [name] ZOX:[id number] DOB: 1926-08-14 DOA: 12/14/2021  HPI: [name] is a 88 y.o. female with history of diastolic dysfunction, hypertension has been experiencing nausea vomiting for the last 2 days.  Has noticed dark yellow fluid when vomiting.  Denies any abdominal pain.  Has not umbilical hernia which has been chronic and does not look obstructed.  A week ago patient was treated for cough and shortness of breath for pneumonia.  Patient's leukocytosis has persistently remained elevated.  Was placed on doxycycline.  Patient does say that she has productive cough last few days.  Due to worsening nausea vomiting which has been persistent patient was brought to the ER.  ED Course: In the ER patient's labs show significant leukocytosis of 27,000 with high sensitive troponin of 9 and 13 EKG showing normal sinus rhythm low voltage potassium 3.20 blood glucose 161.  Patient underwent CT chest abdomen pelvis which shows features concerning for gastroparesis.  Occult blood done for the gastric fluid is positive.  ER physician discussed with on-call gastroenterology Dr. Paulita Fujita will be seeing patient in consult.  Patient admitted for nausea vomiting which could be from gastroparesis and possible GI bleed.  Blood cultures ordered since patient has significant leukocytosis.  Subjective: Patient did not have any vomiting since arrival    Physical Exam: Constitutional: Moderately built and nourished. Vitals:   12/15/21 0900 12/15/21 0915 12/15/21 1000 12/15/21 1015  BP: 140/72 (!) 142/53 137/78 (!) 158/70  Pulse: 96 96 94 100  Resp: (!) 22 12 15 18   Temp:      TempSrc:      SpO2: 96% 97% 96% 97%  Weight:      Height:       Eyes: Anicteric no pallor. ENMT: No discharge from the ears eyes nose and mouth. Neck: No mass felt.  No neck rigidity. Respiratory: No rhonchi or crepitations. Cardiovascular: S1-S2 heard. Abdomen: Soft nontender umbilical hernia  seen. Musculoskeletal: No edema. Skin: No rash. Neurologic: Alert awake oriented to time place and person.  Moves all extremities. Psychiatric: Appears normal.  Normal affect.   Labs on Admission: I have personally reviewed following labs and imaging studies  CBC: Recent Labs  Lab 12/14/21 0829 12/14/21 1321 12/14/21 1721  WBC 27.0* 26.3* 25.1*  NEUTROABS 24.6*  --   --   HGB 14.7 12.2 12.2  HCT 44.1 38.2 37.1  MCV 90.6 92.3 91.8  PLT 695* 602* 811*   Basic Metabolic Panel: Recent Labs  Lab 12/14/21 0829 12/14/21 1721 12/15/21 0418  NA 142  --  140  K 3.2*  --  2.8*  CL 85*  --  96*  CO2 39*  --  37*  GLUCOSE 161*  --  115*  BUN 18  --  17  CREATININE 0.97  --  0.87  CALCIUM 9.3  --  8.3*  MG  --  2.2  --    GFR: Estimated Creatinine Clearance: 36.2 mL/min (by C-G formula based on SCr of 0.87 mg/dL). Liver Function Tests: Recent Labs  Lab 12/14/21 0829 12/15/21 0418  AST 26 17  ALT 13 11  ALKPHOS 120 82  BILITOT 0.6 0.5  PROT 8.1 6.1*  ALBUMIN 3.4* 2.6*   Recent Labs  Lab 12/14/21 0829  LIPASE 24   No results for input(s): AMMONIA in the last 168 hours. Coagulation Profile: No results for input(s): INR, PROTIME in the last 168 hours. Cardiac Enzymes: No results for input(s): CKTOTAL, CKMB,  CKMBINDEX, TROPONINI in the last 168 hours. BNP (last 3 results) No results for input(s): PROBNP in the last 8760 hours. HbA1C: Recent Labs    12/14/21 1600  HGBA1C 5.7*   CBG: Recent Labs  Lab 12/14/21 1839 12/15/21 0428  GLUCAP 126* 114*   Lipid Profile: No results for input(s): CHOL, HDL, LDLCALC, TRIG, CHOLHDL, LDLDIRECT in the last 72 hours. Thyroid Function Tests: No results for input(s): TSH, T4TOTAL, FREET4, T3FREE, THYROIDAB in the last 72 hours. Anemia Panel: No results for input(s): VITAMINB12, FOLATE, FERRITIN, TIBC, IRON, RETICCTPCT in the last 72 hours. Urine analysis:    Component Value Date/Time   COLORURINE YELLOW 12/14/2021 1754    APPEARANCEUR CLOUDY (A) 12/14/2021 1754   LABSPEC <1.005 (L) 12/14/2021 1754   PHURINE 8.5 (H) 12/14/2021 1754   GLUCOSEU NEGATIVE 12/14/2021 1754   HGBUR MODERATE (A) 12/14/2021 1754   BILIRUBINUR NEGATIVE 12/14/2021 1754   KETONESUR NEGATIVE 12/14/2021 1754   PROTEINUR 30 (A) 12/14/2021 1754   UROBILINOGEN 0.2 05/05/2014 2014   NITRITE NEGATIVE 12/14/2021 1754   LEUKOCYTESUR MODERATE (A) 12/14/2021 1754   Sepsis Labs: @LABRCNTIP (procalcitonin:4,lacticidven:4) ) Recent Results (from the past 240 hour(s))  Culture, blood (routine x 2)     Status: None (Preliminary result)   Collection Time: 12/14/21  8:24 AM   Specimen: BLOOD  Result Value Ref Range Status   Specimen Description BLOOD LEFT ANTECUBITAL  Final   Special Requests   Final    BOTTLES DRAWN AEROBIC AND ANAEROBIC Blood Culture results may not be optimal due to an inadequate volume of blood received in culture bottles   Culture   Final    NO GROWTH < 24 HOURS Performed at [facility] [address] [place], [place] [address]    Report Status PENDING  Incomplete  Resp Panel by RT-PCR (Flu A&B, Covid) Nasopharyngeal Swab     Status: None   Collection Time: 12/14/21  9:57 AM   Specimen: Nasopharyngeal Swab; Nasopharyngeal(NP) swabs in vial transport medium  Result Value Ref Range Status   SARS Coronavirus 2 by RT PCR NEGATIVE NEGATIVE Final    Comment: (NOTE) SARS-CoV-2 target nucleic acids are NOT DETECTED.  The SARS-CoV-2 RNA is generally detectable in upper respiratory specimens during the acute phase of infection. The lowest concentration of SARS-CoV-2 viral copies this assay can detect is 138 copies/mL. A negative result does not preclude SARS-Cov-2 infection and should not be used as the sole basis for treatment or other patient management decisions. A negative result may occur with  improper specimen collection/handling, submission of specimen other than nasopharyngeal swab, presence of viral  mutation(s) within the areas targeted by this assay, and inadequate number of viral copies(<138 copies/mL). A negative result must be combined with clinical observations, patient history, and epidemiological information. The expected result is Negative.  Fact Sheet for Patients:  EntrepreneurPulse.com.au  Fact Sheet for Healthcare Providers:  IncredibleEmployment.be  This test is no t yet approved or cleared by the [place] FDA and  has been authorized for detection and/or diagnosis of SARS-CoV-2 by FDA under an Emergency Use Authorization (EUA). This EUA will remain  in effect (meaning this test can be used) for the duration of the COVID-19 declaration under Section 564(b)(1) of the Act, 21 U.S.C.section 360bbb-3(b)(1), unless the authorization is terminated  or revoked sooner.       Influenza A by PCR NEGATIVE NEGATIVE Final   Influenza B by PCR NEGATIVE NEGATIVE Final    Comment: (NOTE) The Xpert Xpress  SARS-CoV-2/FLU/RSV plus assay is intended as an aid in the diagnosis of influenza from Nasopharyngeal swab specimens and should not be used as a sole basis for treatment. Nasal washings and aspirates are unacceptable for Xpert Xpress SARS-CoV-2/FLU/RSV testing.  Fact Sheet for Patients: EntrepreneurPulse.com.au  Fact Sheet for Healthcare Providers: IncredibleEmployment.be  This test is not yet approved or cleared by the [place] FDA and has been authorized for detection and/or diagnosis of SARS-CoV-2 by FDA under an Emergency Use Authorization (EUA). This EUA will remain in effect (meaning this test can be used) for the duration of the COVID-19 declaration under Section 564(b)(1) of the Act, 21 U.S.C. section 360bbb-3(b)(1), unless the authorization is terminated or revoked.  Performed at [facility], [place] [address], [place], [place] [address]   Culture, blood (routine x 2)      Status: None (Preliminary result)   Collection Time: 12/14/21  2:20 PM   Specimen: BLOOD  Result Value Ref Range Status   Specimen Description BLOOD RIGHT ANTECUBITAL  Final   Special Requests   Final    BOTTLES DRAWN AEROBIC AND ANAEROBIC Blood Culture adequate volume   Culture   Final    NO GROWTH < 24 HOURS Performed at [facility], [place] [address] [place], [place] [address]    Report Status PENDING  Incomplete     Radiological Exams on Admission: CT CHEST ABDOMEN PELVIS W CONTRAST  Result Date: 12/14/2021 CLINICAL DATA:  Persistent vomiting for 3 days.  Abdominal pain. EXAM: CT CHEST, ABDOMEN, AND PELVIS WITH CONTRAST TECHNIQUE: Multidetector CT imaging of the chest, abdomen and pelvis was performed following the standard protocol during bolus administration of intravenous contrast. RADIATION DOSE REDUCTION: This exam was performed according to the departmental dose-optimization program which includes automated exposure control, adjustment of the mA and/or kV according to patient size and/or use of iterative reconstruction technique. CONTRAST:  152mL OMNIPAQUE IOHEXOL 300 MG/ML  SOLN COMPARISON:  06/02/2018. FINDINGS: CT CHEST FINDINGS Cardiovascular: Heart and inferior mediastinal structures shifted to the left. Heart is normal in size. No pericardial effusion. Right and left coronary artery calcifications. Great vessels are normal in caliber. Mild aortic atherosclerosis. Mediastinum/Nodes: Moderate sized hiatal hernia. Stomach extends through a relatively narrow defect at the esophageal hiatus, narrowing the herniated portion of the stomach to 3.4 cm. The esophagus above the hernia appears thick-walled and ill-defined. No mediastinal or hilar masses. No enlarged lymph nodes. Trachea is unremarkable. Lungs/Pleura: Partial lower lobe atelectasis bilaterally, left greater than right,. Mild opacity in the dependent upper lobes adjacent to the oblique fissures, also consistent with  atelectasis. Remainder of the lungs is clear. No convincing pneumonia or pulmonary edema. Trace right pleural effusion. No pneumothorax. Musculoskeletal: Recent appearing fracture of the posterolateral right ninth rib. No other fractures. No bone lesions. CT ABDOMEN PELVIS FINDINGS Hepatobiliary: Liver normal in size. Vague low-density area, inferior aspect of segment 4 B, consistent with focal fatty infiltration. No liver masses. Gallbladder distended, otherwise unremarkable. Common bile duct measures 8-9 mm, increased compared to the prior CT. No evidence of a duct stone. No intrahepatic bile duct dilation. Pancreas: Partial fatty replacement of the pancreas. No pancreatic mass or inflammation. Spleen: Normal in size without focal abnormality. Adrenals/Urinary Tract: No adrenal masses. Mild bilateral renal cortical thinning. Kidneys normal in position and orientation. Symmetric renal enhancement. Symmetric mild delay renal excrete mint. No renal mass or stone. No hydronephrosis. Ureters normal in course and in caliber. Bladder is unremarkable. Stomach/Bowel: Stomach below the hiatal  hernia is moderately distended with non dependent air and dependent fluid. No wall thickening. No adjacent inflammation. Small bowel and colon are normal in caliber. No wall thickening. No bowel inflammation. Normal appendix. Vascular/Lymphatic: Aortic atherosclerosis. No aneurysm. No enlarged lymph nodes. Reproductive: Uterus and bilateral adnexa are unremarkable. Other: Fat containing paraumbilical hernia, unchanged from the prior CT. No bowel enters this. Hernia mesh consistent with previous inguinal herniorrhaphy is, stable. No ascites. Musculoskeletal: Thoracolumbar scoliosis. Diffuse skeletal demineralization. Mild chronic loss of vertebral body height at L5, stable from the prior CT. No acute fracture. No bone lesion. Left hip total arthroplasty appears well seated and aligned. Skeletal structures are diffusely demineralized.  IMPRESSION: CHEST CT 1. Moderate-sized hiatal hernia. Esophagus above the hiatal hernia appears thick-walled with an ill-defined margin consistent with adjacent edema. Esophagitis suspected. 2. Left greater than right lung base opacities consistent with atelectasis. Trace right pleural effusion. 3. No convincing pneumonia.  No evidence of pulmonary edema. 4. Mild aortic atherosclerosis.  Coronary artery calcifications. ABDOMEN AND PELVIS CT 1. Moderate distention of the stomach without wall thickening, adjacent inflammation or evidence of obstruction. Possible gastro paresis. 2. No acute finding within the abdomen or pelvis. No evidence of bowel obstruction or inflammation. 3. Aortic atherosclerosis. 4. Stable paraumbilical fat containing hernia. Electronically Signed   By: Lajean Manes M.D.   On: 12/14/2021 11:31   DG Chest Portable 1 View  Result Date: 12/14/2021 CLINICAL DATA:  88 year old female with history of chest and abdominal pain. Vomiting. EXAM: PORTABLE CHEST 1 VIEW COMPARISON:  Chest x-ray 07/17/2018. FINDINGS: Bibasilar opacities (left-greater-than-right) which may reflect areas of atelectasis and/or consolidation. Blunting of the left costophrenic sulcus indicative of a small to moderate left pleural effusion. Probable small right pleural effusion also noted. No pneumothorax. No evidence of pulmonary edema. Heart size is normal. Upper mediastinal contours are within normal limits. Atherosclerotic calcifications are noted in the thoracic aorta. Severe calcifications of the mitral annulus. Status post right shoulder arthroplasty. IMPRESSION: 1. Low lung volumes with bibasilar areas of atelectasis and/or consolidation, with superimposed moderate left and small right pleural effusions. 2. Aortic atherosclerosis. 3. Severe calcifications of the mitral annulus. Electronically Signed   By: Vinnie Langton M.D.   On: 12/14/2021 09:22    EKG: Independently reviewed.  Normal sinus rhythm low  voltage.  Assessment/Plan Principal Problem:   Acute GI bleeding Active Problems:   GERD   Essential hypertension, benign   Leucocytosis   Gastroparesis    Nausea vomiting with CT scan showing features concerning for possible gastroparesis.  We will keep patient n.p.o. avoid narcotics if possible and gentle hydration correct electrolytes and order KUB in the morning.  Gastroenterologist Dr. Paulita Fujita has been consulted.  We will follow the recommendations. Possible GI bleed with patient throwing up dark fluid.  Protonix.  Hemoglobin over 12.  Doubt significant GI bleed.   Significant leukocytosis with patient recently being treated for pneumonia with patient still having persistent cough productive of sputum suspect patient may be aspirating.  Given that patient has significant leukocytosis I have ordered blood cultures we will keep patient on antibiotics get speech therapy evaluation.  Also with UTI. Prior history of hypertension presently not on any medication we will keep patient on as needed IV hydralazine and follow blood pressure trends. History of diastolic CHF with the last EF measured in 2019 was 55 to 60% with grade 1 diastolic dysfunction.  Closely monitor respiratory status since patient is getting fluids. Umbilical hernia containing fat which has been  chronic and is seen in the CAT scan which appears to be stable. Patient has been bedbound for a long time since 2019. Hyperglycemia -check hemoglobin A1c with next blood draw. Hypokalemia -potassium has been added to the fluids.  Follow metabolic panel check magnesium with next blood draw. Patient does use chronically pain medications.  We will try to minimize given that patient possibly has gastroparesis.  DNR Patient already symptom-free.  Awaiting GI recommendations.  If no further endoscopy likely discharge tomorrow.  Phillips Grout MD [facility] Pager [phone].  If 7PM-7AM, please contact  night-coverage [email or web address] Password [facility]  12/15/2021, 10:43 AM    Patient ID: [name], female   DOB: 1926/05/03, 88 y.o.   MRN: [id number]

## 2021-12-15 NOTE — ED Notes (Signed)
Notified Dr. Cyd Silence of patient's potassium level of 2.8

## 2021-12-16 ENCOUNTER — Encounter (HOSPITAL_COMMUNITY): Payer: Self-pay | Admitting: Internal Medicine

## 2021-12-16 LAB — BASIC METABOLIC PANEL
Anion gap: 9 (ref 5–15)
BUN: 21 mg/dL (ref 8–23)
CO2: 29 mmol/L (ref 22–32)
Calcium: 8.8 mg/dL — ABNORMAL LOW (ref 8.9–10.3)
Chloride: 99 mmol/L (ref 98–111)
Creatinine, Ser: 0.63 mg/dL (ref 0.44–1.00)
GFR, Estimated: >60 mL/min (ref >60)
Glucose, Bld: 111 mg/dL — ABNORMAL HIGH (ref 70–99)
Potassium: 3.4 mmol/L — ABNORMAL LOW (ref 3.5–5.1)
Sodium: 137 mmol/L (ref 135–145)

## 2021-12-16 LAB — CBC WITH DIFFERENTIAL/PLATELET
Abs Immature Granulocytes: 0.06 10*3/uL (ref 0.00–0.07)
Basophils Absolute: 0.1 10*3/uL (ref 0.0–0.1)
Basophils Relative: 1 %
Eosinophils Absolute: 0.1 10*3/uL (ref 0.0–0.5)
Eosinophils Relative: 1 %
HCT: 32.4 % — ABNORMAL LOW (ref 36.0–46.0)
Hemoglobin: 10.3 g/dL — ABNORMAL LOW (ref 12.0–15.0)
Immature Granulocytes: 0 %
Lymphocytes Relative: 9 %
Lymphs Abs: 1.1 10*3/uL (ref 0.7–4.0)
MCH: 29.4 pg (ref 26.0–34.0)
MCHC: 31.8 g/dL (ref 30.0–36.0)
MCV: 92.6 fL (ref 80.0–100.0)
Monocytes Absolute: 1.3 10*3/uL — ABNORMAL HIGH (ref 0.1–1.0)
Monocytes Relative: 9 %
Neutro Abs: 10.7 10*3/uL — ABNORMAL HIGH (ref 1.7–7.7)
Neutrophils Relative %: 80 %
Platelets: 523 10*3/uL — ABNORMAL HIGH (ref 150–400)
RBC: 3.5 MIL/uL — ABNORMAL LOW (ref 3.87–5.11)
RDW: 12.8 % (ref 11.5–15.5)
WBC: 13.3 10*3/uL — ABNORMAL HIGH (ref 4.0–10.5)
nRBC: 0 % (ref 0.0–0.2)

## 2021-12-16 LAB — URINE CULTURE

## 2021-12-16 LAB — GLUCOSE, CAPILLARY
Glucose-Capillary: 143 mg/dL — ABNORMAL HIGH (ref 70–99)
Glucose-Capillary: 103 mg/dL — ABNORMAL HIGH (ref 70–99)

## 2021-12-16 MED ORDER — BENZONATATE 100 MG PO CAPS
100.0000 mg | ORAL_CAPSULE | Freq: Three times a day (TID) | ORAL | Status: DC | PRN
  Administered 2021-12-16 (×2): 100 mg via ORAL
  Filled 2021-12-16 (×2): qty 1

## 2021-12-16 MED ORDER — POTASSIUM CHLORIDE 10 MEQ/100ML IV SOLN
10.0000 meq | INTRAVENOUS | Status: DC
  Administered 2021-12-16 (×3): 10 meq via INTRAVENOUS
  Filled 2021-12-16 (×3): qty 100

## 2021-12-16 MED ORDER — ALUM & MAG HYDROXIDE-SIMETH 200-200-20 MG/5ML PO SUSP
30.0000 mL | Freq: Once | ORAL | Status: AC
  Administered 2021-12-16: 30 mL via ORAL
  Filled 2021-12-16: qty 30

## 2021-12-16 MED ORDER — POTASSIUM CHLORIDE CRYS ER 10 MEQ PO TBCR
10.0000 meq | EXTENDED_RELEASE_TABLET | Freq: Two times a day (BID) | ORAL | Status: DC
  Administered 2021-12-16: 10 meq via ORAL
  Filled 2021-12-16: qty 1

## 2021-12-16 MED ORDER — LIDOCAINE VISCOUS HCL 2 % MT SOLN
15.0000 mL | Freq: Once | OROMUCOSAL | Status: AC
Start: 2021-12-16 — End: 2021-12-16
  Administered 2021-12-16: 15 mL via ORAL
  Filled 2021-12-16: qty 15

## 2021-12-16 NOTE — Plan of Care (Signed)
Pt admitted for GI bleed. BP 133/72 (BP Location: Right Arm)    Pulse 87    Temp 98.5 F (36.9 C) (Oral)    Resp 18    Ht 5\' 1"  (1.549 m)    Wt 70.4 kg    SpO2 97%    BMI 29.33 kg/m  no c/o pain Tele NSR. Pt oriented to floor, call bell near by, bed alarm on, 2/4 bed rails up and bed in lowest position. No further needs voiced at this time. [name] 12/16/21 2:01 AM

## 2021-12-16 NOTE — Progress Notes (Signed)
Pt discharge to [facility] BP (!) 157/92 (BP Location: Left Arm)    Pulse 93    Temp 98.1 F (36.7 C)    Resp 18    Ht 5\' 1"  (1.549 m)    Wt 70.4 kg    SpO2 (!) 88%    BMI 64.38 kg/m  No complications on discharge. [name] 12/16/21 8:30 PM

## 2021-12-16 NOTE — Care Management Important Message (Signed)
Important Message  Patient Details  Name: [name] MRN: [id number] Date of Birth: 01-05-1926   Medicare Important Message Given:        [name] 12/16/2021, 1:08 PM

## 2021-12-16 NOTE — Discharge Summary (Signed)
Physician Discharge Summary  Patient ID: [name] MRN: [id number] DOB/AGE: [birth year] 88 y.o.  Admit date: 12/14/2021 Discharge date: 12/16/2021  Admission Diagnoses:  Discharge Diagnoses:  Principal Problem:   Acute GI bleeding Active Problems:   GERD   Essential hypertension, benign   Leucocytosis   Gastroparesis   Discharged Condition: stable  Hospital Course: [name] is a 88 y.o. female with history of diastolic dysfunction, hypertension has been experiencing nausea vomiting for the last 2 days.  Has noticed dark yellow fluid when vomiting.  Denies any abdominal pain.  Has not umbilical hernia which has been chronic and does not look obstructed.  A week ago patient was treated for cough and shortness of breath for pneumonia.  Patient's leukocytosis has persistently remained elevated.  Was placed on doxycycline.  Patient does say that she has productive cough last few days.  Due to worsening nausea vomiting which has been persistent patient was brought to the ER.   ED Course: In the ER patient's labs show significant leukocytosis of 27,000 with high sensitive troponin of 9 and 13 EKG showing normal sinus rhythm low voltage potassium 3.20 blood glucose 161.  Patient underwent CT chest abdomen pelvis which shows features concerning for gastroparesis.  Occult blood done for the gastric fluid is positive.  ER physician discussed with on-call gastroenterology Dr. Paulita Fujita will be seeing patient in consult.  Patient admitted for nausea vomiting which could be from gastroparesis and possible GI bleed.  Blood cultures ordered since patient has significant leukocytosis.   Patient was admitted placed on a PPI and symptoms resolved soon after being admitted.  GI was consulted did not recommend any further scoping.  Recommended twice a day Protonix.  Patient's hemoglobin remained stable.  Patient is being transferred back to her nursing facility with close outpatient follow-up.  Her  potassium was low which was repleted.  She is to follow-up with primary care physician in approximately 1 to 2 weeks at which point we will continue monitoring her BMP and CBC chronically.  Patient being discharged in stable and improved condition at her baseline status.   Discharge Exam: Blood pressure (!) 171/72, pulse 93, temperature 98.3 F (36.8 C), resp. rate 18, height 5\' 1"  (1.549 m), weight 70.4 kg, SpO2 96 %. General appearance: alert and cooperative Neck: no adenopathy, no carotid bruit, no JVD, supple, symmetrical, trachea midline, and thyroid not enlarged, symmetric, no tenderness/mass/nodules Resp: clear to auscultation bilaterally Cardio: regular rate and rhythm, S1, S2 normal, no murmur, click, rub or gallop GI: soft, non-tender; bowel sounds normal; no masses,  no organomegaly Extremities: extremities normal, atraumatic, no cyanosis or edema Pulses: 2+ and symmetric Skin: Skin color, texture, turgor normal. No rashes or lesions  Disposition: Discharge disposition: 03-Skilled Nursing Facility       Discharge Instructions     Diet - low sodium heart healthy   Complete by: As directed    Discharge instructions   Complete by: As directed    Follow-up with primary care physician in 1 to 2 weeks  Follow-up with GI in 2 to 4 weeks   Increase activity slowly   Complete by: As directed       Allergies as of 12/16/2021       Reactions   Aspirin Other (See Comments)   Unknown - On MAR   Boniva [ibandronic Acid] Other (See Comments)   Unknown - On MAR   Morphine Other (See Comments)   Unknown - On MAR   Nitrofurantoin  Other (See Comments)   Unknown - On MAR   Penicillins Other (See Comments)   On MAR  Received Ancef without problem  Has patient had a PCN reaction causing immediate rash, facial/tongue/throat swelling, SOB or lightheadedness with hypotension: Unknown Has patient had a PCN reaction causing severe rash involving mucus membranes or skin necrosis:  Unknown Has patient had a PCN reaction that required hospitalization: Unknown Has patient had a PCN reaction occurring within the last 10 years: Unknown If all of the above answers are "NO", then may proceed with Cephalosporin use.   Sulfonamide Derivatives Other (See Comments)   Unknown - On MAR        Medication List     STOP taking these medications    cefTRIAXone 1 g injection Commonly known as: ROCEPHIN   omeprazole 40 MG capsule Commonly known as: PRILOSEC   oxyCODONE 5 MG immediate release tablet Commonly known as: Oxy IR/ROXICODONE   Potassium Chloride ER 20 MEQ Tbcr       TAKE these medications    acetaminophen 325 MG tablet Commonly known as: TYLENOL Take 650 mg by mouth every 8 (eight) hours as needed for mild pain or moderate pain.   acetaminophen 500 MG tablet Commonly known as: TYLENOL Take 1,000 mg by mouth 2 (two) times daily.   acetaminophen 160 MG/5ML solution Commonly known as: TYLENOL Take 31.3 mLs (1,000 mg total) by mouth every 8 (eight) hours as needed.   alum & mag hydroxide-simeth 200-200-20 MG/5ML suspension Commonly known as: MAALOX/MYLANTA Take 30 mLs by mouth every 4 (four) hours as needed for indigestion.   cetirizine 10 MG tablet Commonly known as: ZYRTEC Take 10 mg by mouth daily as needed for allergies.   Cholecalciferol 25 MCG (1000 UT) tablet Take 1,000 Units by mouth daily.   clotrimazole 1 % vaginal cream Commonly known as: GYNE-LOTRIMIN Place 1 Applicatorful vaginally 2 (two) times daily as needed (itching).   Dermacloud Oint Apply 1 application topically 2 (two) times daily. Apply to buttocks   dextrose 5 % and 0.45% NaCl infusion Inject 80 mL/hr into the vein continuous. Infuse at 66mL/hr x2L and d/c when completed   diclofenac Sodium 1 % Gel Commonly known as: VOLTAREN Apply 2 g topically 3 (three) times daily as needed (left shoulder pain).   docusate sodium 100 MG capsule Commonly known as: COLACE Take  100 mg by mouth 2 (two) times daily as needed for mild constipation.   doxycycline 100 MG tablet Commonly known as: VIBRA-TABS Take 100 mg by mouth 2 (two) times daily. For 7 days What changed: Another medication with the same name was removed. Continue taking this medication, and follow the directions you see here.   estradiol 0.1 MG/GM vaginal cream Commonly known as: ESTRACE Place 0.5 g vaginally 3 (three) times a week. Monday, Wednesday, Friday. Administer at night.   feeding supplement (OSMOLITE 1.2 CAL) Liqd Continue tube feeding at 55 ml/hr with adjustments per your dietician at the SNF   fluticasone 50 MCG/ACT nasal spray Commonly known as: FLONASE Place 1 spray into both nostrils 2 (two) times daily.   furosemide 20 MG tablet Commonly known as: LASIX Take 20 mg by mouth daily.   gabapentin 100 MG capsule Commonly known as: NEURONTIN Take 200 mg by mouth daily.   gabapentin 300 MG capsule Commonly known as: NEURONTIN Take 300 mg by mouth at bedtime.   geriatric multivitamins-minerals Liqd Place 15 mLs into feeding tube daily.   gi cocktail Susp suspension Take 30  mLs by mouth every 4 (four) hours as needed for indigestion. "Mylanta Coat-Cool 1,200-270-80 (GI Cocktail)"   ipratropium-albuterol 0.5-2.5 (3) MG/3ML Soln Commonly known as: DUONEB Take 3 mLs by nebulization every 6 (six) hours as needed (shortness of breath/wheezing).   Lidocaine HCl 4 % Crea Apply 1 application topically 2 (two) times daily. Apply to lower back for back pain   meclizine 25 MG tablet Commonly known as: ANTIVERT Take 25 mg by mouth every 8 (eight) hours as needed for dizziness.   melatonin 3 MG Tabs tablet Take 3 mg by mouth at bedtime.   multivitamin with minerals tablet Take 1 tablet by mouth daily.   nystatin cream Commonly known as: MYCOSTATIN Apply 1 application topically 2 (two) times daily as needed for dry skin. Apply to groin/buttocks   ondansetron 4 MG  tablet Commonly known as: ZOFRAN Take 4 mg by mouth every 6 (six) hours as needed for nausea or vomiting.   pantoprazole sodium 40 mg Commonly known as: PROTONIX Place 20 mLs (40 mg total) into feeding tube 2 (two) times daily.   polyethylene glycol 17 g packet Commonly known as: MIRALAX / GLYCOLAX Take 17 g by mouth daily.   Preparation H 0.25-14-74.9 % rectal ointment Generic drug: phenylephrine-shark liver oil-mineral oil-petrolatum Place 1 application rectally 2 (two) times daily as needed for hemorrhoids.   promethazine 12.5 MG tablet Commonly known as: PHENERGAN Take 12.5 mg by mouth every 6 (six) hours as needed for nausea or vomiting.   senna-docusate 8.6-50 MG tablet Commonly known as: Senokot-S Take 1 tablet by mouth at bedtime.   senna-docusate 8.6-50 MG tablet Commonly known as: Senokot-S Take 1 tablet by mouth 2 (two) times daily as needed for mild constipation or moderate constipation.         Signed: Reef Achterberg A 12/16/2021, 10:25 AM

## 2021-12-16 NOTE — TOC Transition Note (Signed)
Transition of Care [facility]) - CM/SW Discharge Note   Patient Details  Name: [name] MRN: [id number] Date of Birth: [birth year]  Transition of Care [facility]) CM/SW Contact:  Tom-Johnson, Renea Ee, RN Phone Number: 12/16/2021, 4:13 PM   Clinical Narrative:    Patient is scheduled for discharge today. CM notified daughter, Tonia Ghent and she agrees for patient to return back to [facility]. CM notified Janie, Admissions coordinator and she requested transfer report and discharge summary faxed to facility. CM faxed information through [place]. Patient's nurse to call report to [phone]. CM scheduled [facility] for transportation. No further TOC needs noted.   Final next level of care: [place] Barriers to Discharge: Barriers Resolved   Patient Goals and CMS Choice Patient states their goals for this hospitalization and ongoing recovery are:: To return to [facility].gov Compare Post Acute Care list provided to:: Patient Choice offered to / list presented to : Patient  Discharge Placement              Patient chooses bed at: [facility] Patient to be transferred to facility by: [facility] Name of family member notified: Daughter, Yuvia Plant Patient and family notified of of transfer: 12/16/21  Discharge Plan and Services                DME Arranged: N/A DME Agency: NA         HH Agency: NA        Social Determinants of Health (SDOH) Interventions     Readmission Risk Interventions No flowsheet data found.

## 2021-12-16 NOTE — ED Notes (Signed)
Requested purple man from 68M

## 2021-12-19 LAB — CULTURE, BLOOD (ROUTINE X 2)
Culture: NO GROWTH
Culture: NO GROWTH
Special Requests: ADEQUATE

## 2022-09-29 ENCOUNTER — Ambulatory Visit: Payer: 59 | Admitting: Podiatry

## 2022-12-01 ENCOUNTER — Ambulatory Visit: Payer: 59 | Admitting: Podiatry

## 2023-01-12 ENCOUNTER — Ambulatory Visit (INDEPENDENT_AMBULATORY_CARE_PROVIDER_SITE_OTHER): Payer: Medicare Other | Admitting: Podiatry

## 2023-01-12 ENCOUNTER — Encounter: Payer: Self-pay | Admitting: Podiatry

## 2023-01-12 DIAGNOSIS — M79675 Pain in left toe(s): Secondary | ICD-10-CM

## 2023-01-12 DIAGNOSIS — M79674 Pain in right toe(s): Secondary | ICD-10-CM | POA: Diagnosis not present

## 2023-01-12 DIAGNOSIS — B351 Tinea unguium: Secondary | ICD-10-CM | POA: Diagnosis not present

## 2023-01-12 NOTE — Progress Notes (Signed)
This patient presents to my office for at risk foot care.  This patient requires this care by a professional since this patient will be at risk due to having pvd.  This patient is unable to cut nails herself since the patient cannot reach her nails.These nails are painful walking and wearing shoes.  This patient presents for at risk foot care today.  General Appearance  Alert, conversant and in no acute stress.  Vascular  Dorsalis pedis and posterior tibial  pulses are absent  bilaterally.  Capillary return is within normal limits  bilaterally. Cold feet.  Purplish feet  B/L. bilaterally.  Neurologic  Senn-Weinstein monofilament wire test within normal limits  bilaterally. Muscle power within normal limits bilaterally.  Nails Thick disfigured discolored nails with subungual debris  from hallux to fifth toes bilaterally. No evidence of bacterial infection or drainage bilaterally.  Orthopedic  No limitations of motion  feet .  No crepitus or effusions noted.  No bony pathology or digital deformities noted.  Skin  normotropic skin with no porokeratosis noted bilaterally.  No signs of infections or ulcers noted.     Onychomycosis  Pain in right toes  Pain in left toes  Consent was obtained for treatment procedures.   Mechanical debridement of nails 1-5  bilaterally performed with a nail nipper.  Filed with dremel without incident. Patient has long deviated toenails which seem to lift the proximal nail fold  left foot.  Cauterized her third toenail subungually.    Return office visit     3 months                 Told patient to return for periodic foot care and evaluation due to potential at risk complications.   Gardiner Barefoot DPM

## 2023-01-12 NOTE — Addendum Note (Signed)
Addended by: Gardiner Barefoot on: 01/12/2023 03:42 PM   Modules accepted: Level of Service

## 2023-11-30 ENCOUNTER — Other Ambulatory Visit: Payer: Self-pay

## 2023-11-30 ENCOUNTER — Emergency Department (HOSPITAL_COMMUNITY): Payer: Medicare Other

## 2023-11-30 ENCOUNTER — Emergency Department (HOSPITAL_COMMUNITY)
Admission: EM | Admit: 2023-11-30 | Discharge: 2023-11-30 | Disposition: A | Discharge status: Home / Self Care | Payer: Medicare Other | Attending: Emergency Medicine | Admitting: Emergency Medicine

## 2023-11-30 ENCOUNTER — Encounter (HOSPITAL_COMMUNITY): Payer: Self-pay

## 2023-11-30 DIAGNOSIS — R079 Chest pain, unspecified: Secondary | ICD-10-CM | POA: Insufficient documentation

## 2023-11-30 DIAGNOSIS — R1030 Lower abdominal pain, unspecified: Secondary | ICD-10-CM

## 2023-11-30 DIAGNOSIS — R109 Unspecified abdominal pain: Secondary | ICD-10-CM | POA: Diagnosis not present

## 2023-11-30 DIAGNOSIS — I5032 Chronic diastolic (congestive) heart failure: Secondary | ICD-10-CM | POA: Insufficient documentation

## 2023-11-30 DIAGNOSIS — R339 Retention of urine, unspecified: Secondary | ICD-10-CM

## 2023-11-30 DIAGNOSIS — I110 Hypertensive heart disease with heart failure: Secondary | ICD-10-CM | POA: Insufficient documentation

## 2023-11-30 DIAGNOSIS — Z87891 Personal history of nicotine dependence: Secondary | ICD-10-CM | POA: Diagnosis not present

## 2023-11-30 DIAGNOSIS — Z96611 Presence of right artificial shoulder joint: Secondary | ICD-10-CM | POA: Insufficient documentation

## 2023-11-30 DIAGNOSIS — N3000 Acute cystitis without hematuria: Secondary | ICD-10-CM

## 2023-11-30 DIAGNOSIS — R112 Nausea with vomiting, unspecified: Secondary | ICD-10-CM | POA: Insufficient documentation

## 2023-11-30 LAB — CBC
HCT: 42 % (ref 36.0–46.0)
Hemoglobin: 13.5 g/dL (ref 12.0–15.0)
MCH: 30.5 pg (ref 26.0–34.0)
MCHC: 32.1 g/dL (ref 30.0–36.0)
MCV: 95 fL (ref 80.0–100.0)
Platelets: 387 10*3/uL (ref 150–400)
RBC: 4.42 MIL/uL (ref 3.87–5.11)
RDW: 13 % (ref 11.5–15.5)
WBC: 22.8 10*3/uL — ABNORMAL HIGH (ref 4.0–10.5)
nRBC: 0 % (ref 0.0–0.2)

## 2023-11-30 LAB — URINALYSIS, ROUTINE W REFLEX MICROSCOPIC
Bilirubin Urine: NEGATIVE
Glucose, UA: NEGATIVE mg/dL
Ketones, ur: NEGATIVE mg/dL
Nitrite: NEGATIVE
Protein, ur: 100 mg/dL — AB
Specific Gravity, Urine: 1.015 (ref 1.005–1.030)
WBC, UA: >50 WBC/hpf (ref 0–5)
pH: 6 (ref 5.0–8.0)

## 2023-11-30 LAB — COMPREHENSIVE METABOLIC PANEL
ALT: 8 U/L (ref 0–44)
AST: 24 U/L (ref 15–41)
Albumin: 3.3 g/dL — ABNORMAL LOW (ref 3.5–5.0)
Alkaline Phosphatase: 99 U/L (ref 38–126)
Anion gap: 15 (ref 5–15)
BUN: 36 mg/dL — ABNORMAL HIGH (ref 8–23)
CO2: 35 mmol/L — ABNORMAL HIGH (ref 22–32)
Calcium: 9.1 mg/dL (ref 8.9–10.3)
Chloride: 91 mmol/L — ABNORMAL LOW (ref 98–111)
Creatinine, Ser: 1.33 mg/dL — ABNORMAL HIGH (ref 0.44–1.00)
GFR, Estimated: 37 mL/min — ABNORMAL LOW (ref >60)
Glucose, Bld: 155 mg/dL — ABNORMAL HIGH (ref 70–99)
Potassium: 3 mmol/L — ABNORMAL LOW (ref 3.5–5.1)
Sodium: 141 mmol/L (ref 135–145)
Total Bilirubin: 0.6 mg/dL (ref 0.0–1.2)
Total Protein: 7.1 g/dL (ref 6.5–8.1)

## 2023-11-30 LAB — I-STAT CG4 LACTIC ACID, ED
Lactic Acid, Venous: 2.8 mmol/L (ref 0.5–1.9)
Lactic Acid, Venous: 2.8 mmol/L (ref 0.5–1.9)

## 2023-11-30 LAB — TROPONIN I (HIGH SENSITIVITY)
Troponin I (High Sensitivity): 23 ng/L — ABNORMAL HIGH (ref <18)
Troponin I (High Sensitivity): 13 ng/L (ref <18)

## 2023-11-30 LAB — LIPASE, BLOOD: Lipase: 22 U/L (ref 11–51)

## 2023-11-30 MED ORDER — CEPHALEXIN 500 MG PO CAPS: 500.0000 mg | ORAL_CAPSULE | Freq: Three times a day (TID) | ORAL | 0 refills | Status: DC

## 2023-11-30 MED ORDER — ONDANSETRON HCL 4 MG/2ML IJ SOLN
4.0000 mg | Freq: Once | INTRAMUSCULAR | Status: AC
  Administered 2023-11-30: 4 mg via INTRAVENOUS
  Filled 2023-11-30: qty 2

## 2023-11-30 MED ORDER — SODIUM CHLORIDE 0.9 % IV BOLUS
500.0000 mL | Freq: Once | INTRAVENOUS | Status: AC
  Administered 2023-11-30: 500 mL via INTRAVENOUS

## 2023-11-30 MED ORDER — SODIUM CHLORIDE 0.9 % IV SOLN
1.0000 g | Freq: Once | INTRAVENOUS | Status: AC
  Administered 2023-11-30: 1 g via INTRAVENOUS
  Filled 2023-11-30: qty 10

## 2023-11-30 MED ORDER — IOHEXOL 350 MG/ML SOLN
50.0000 mL | Freq: Once | INTRAVENOUS | Status: AC | PRN
  Administered 2023-11-30: 50 mL via INTRAVENOUS

## 2023-11-30 NOTE — ED Triage Notes (Signed)
[facility] called and informed us this has been happening for 48 hours, rather than the week EMS said. And she has a UTI which she has had her first dose of Rocephin for.   As well as D5 LR, and IM phenergan 12.5 mg

## 2023-11-30 NOTE — ED Triage Notes (Signed)
 Patient brought in from [facility] by EMS for emesis. This has been going on for over a week and they believe it is stool. She has had soft blood pressures with EMS but is alert and holding her emesis bag. He O2 also dropped to 88 with them and the put her on 2L. The facility had her on LR and an IV. She has a hx of gastroparesis and GERD. She had phenergan  at [facility]. Then vomited twice with EMS. No hx of bowel obstruction per EMS. Pt does state she is constipated.

## 2023-11-30 NOTE — ED Notes (Signed)
[name]  called report given to [place].

## 2023-11-30 NOTE — ED Provider Notes (Signed)
 [facility]-EM[facility]Y DEPT [facility] Emergency Department Provider Note MRN:  [id number]  Arrival date & time: 11/30/23     Chief Complaint   Emesis   History of Present Illness   [name] is a 89 y.o. year-old female with a history of CHF presenting to the ED with chief complaint of emesis.  Persistent nausea vomiting for the past few days, decreased bowel movements, abdominal pain, some chest discomfort.  Review of Systems  A thorough review of systems was obtained and all systems are negative except as noted in the HPI and PMH.   Patient's Health History    Past Medical History:  Diagnosis Date   Actinic keratosis    Allergic rhinitis    Back pain    Chronic constipation    Chronic diastolic CHF (congestive heart failure) (HCC) 06/03/2018   GERD (gastroesophageal reflux disease)    Hyperlipidemia    Hypertension    Hyponatremia    Leg edema    Macular degeneration    Mitral regurgitation    mild by echo 2003   Osteoarthritis    Osteoarthrosis    Osteoporosis    Tenosynovitis    myxomatous    Past Surgical History:  Procedure Laterality Date   CARPAL TUNNEL RELEASE     bilateral   ESOPHAGOGASTRODUODENOSCOPY (EGD) WITH PROPOFOL  N/A 06/05/2018   Procedure: ESOPHAGOGASTRODUODENOSCOPY (EGD) WITH PROPOFOL ;  Surgeon: Elicia Claw, MD;  Location: [facility] ENDOSCOPY;  Service: Gastroenterology;  Laterality: N/A;   hernial repain     HIATAL HERNIA REPAIR N/A 06/07/2018   Procedure: LAPAROSCOPIC REPAIR OF HIATAL HERNIA;  Surgeon: Ethyl Lenis, MD;  Location: [facility] OR;  Service: General;  Laterality: N/A;   INGUINAL HERNIA REPAIR     laparoscopic preperitoneal repair of bilateral inguinal hernias   IR GJ TUBE CHANGE  06/20/2018   IR REPLC GASTRO/COLONIC TUBE PERCUT W/FLUORO  08/29/2018   LAPAROSCOPIC GASTROSTOMY N/A 06/07/2018   Procedure: LAPAROSCOPIC GASTROSTOMY;  Surgeon: Ethyl Lenis, MD;  Location: [facility] OR;  Service: General;  Laterality: N/A;   REPLACEMENT TOTAL KNEE  BILATERAL     REVERSE SHOULDER ARTHROPLASTY Right 05/08/2014   Procedure: REVERSE SHOULDER ARTHROPLASTY RIGHT ;  Surgeon: Franky CHRISTELLA Pointer, MD;  Location: [facility] OR;  Service: Orthopedics;  Laterality: Right;   SYNOVECTOMY     of right long finger   tonisellectomy     tonisillectomy     TONSILLECTOMY      Family History  Problem Relation Age of Onset   Heart attack Father    Heart attack Brother     Social History   Socioeconomic History   Marital status: Divorced    Spouse name: Not on file   Number of children: 4   Years of education: Not on file   Highest education level: Not on file  Occupational History   Not on file  Tobacco Use   Smoking status: Former    Current packs/day: 0.00    Average packs/day: 1 pack/day for 2.0 years (2.0 ttl pk-yrs)    Types: Cigarettes    Start date: 12/06/1965    Quit date: 12/07/1967    Years since quitting: 56.0   Smokeless tobacco: Never  Vaping Use   Vaping status: Never Used  Substance and Sexual Activity   Alcohol use: Not Currently    Alcohol/week: 2.0 standard drinks of alcohol    Types: 2 Glasses of wine per week    Comment: 2 glasses per week   Drug use: No   Sexual  activity: Never  Other Topics Concern   Not on file  Social History Narrative   Not on file   Social Drivers of Health   Financial Resource Strain: Not on file  Food Insecurity: Not on file  Transportation Needs: Not on file  Physical Activity: Not on file  Stress: Not on file  Social Connections: Not on file  Intimate Partner Violence: Not on file     Physical Exam   Vitals:   11/30/23 0530 11/30/23 0700  BP: (!) 131/90 (!) 136/109  Pulse: 99 94  Resp: (!) 21 18  Temp:    SpO2: 98% 99%    CONSTITUTIONAL: Chronically ill-appearing, NAD NEURO/PSYCH:  Alert and oriented x 3, no focal deficits EYES:  eyes equal and reactive ENT/NECK:  no LAD, no JVD CARDIO: Regular rate, well-perfused, normal S1 and S2 PULM: CTAB GI/GU: Distended and mildly  tender MSK/SPINE:  No gross deformities, no edema SKIN:  no rash, atraumatic   *Additional and/or pertinent findings included in MDM below  Diagnostic and Interventional Summary    EKG Interpretation Date/Time:  Tuesday November 30 2023 06:19:31 EST Ventricular Rate:  97 PR Interval:  162 QRS Duration:  80 QT Interval:  365 QTC Calculation: 464 R Axis:   -30  Text Interpretation: Sinus tachycardia Multiform ventricular premature complexes Left axis deviation Low voltage, precordial leads Abnormal R-wave progression, late transition Nonspecific T abnrm, anterolateral leads Confirmed by Theadore Sharper [phone]) on 11/30/2023 7:06:32 AM       Labs Reviewed  COMPREHENSIVE METABOLIC PANEL - Abnormal; Notable for the following components:      Result Value   Potassium 3.0 (*)    Chloride 91 (*)    CO2 35 (*)    Glucose, Bld 155 (*)    BUN 36 (*)    Creatinine, Ser 1.33 (*)    Albumin 3.3 (*)    GFR, Estimated 37 (*)    All other components within normal limits  CBC - Abnormal; Notable for the following components:   WBC 22.8 (*)    All other components within normal limits  I-STAT CG4 LACTIC ACID, ED - Abnormal; Notable for the following components:   Lactic Acid, Venous 2.8 (*)    All other components within normal limits  TROPONIN I (HIGH SENSITIVITY) - Abnormal; Notable for the following components:   Troponin I (High Sensitivity) 23 (*)    All other components within normal limits  LIPASE, BLOOD  URINALYSIS, ROUTINE W REFLEX MICROSCOPIC  I-STAT CG4 LACTIC ACID, ED  TROPONIN I (HIGH SENSITIVITY)    CT ABDOMEN PELVIS W CONTRAST    (Results Pending)  DG Chest Port 1 View    (Results Pending)    Medications  sodium chloride  0.9 % bolus 500 mL (0 mLs Intravenous Stopped 11/30/23 0706)  ondansetron  (ZOFRAN ) injection 4 mg (4 mg Intravenous Given 11/30/23 0512)  iohexol  (OMNIPAQUE ) 350 MG/ML injection 50 mL (50 mLs Intravenous Contrast Given 11/30/23 0546)     Procedures   /  Critical Care Procedures  ED Course and Medical Decision Making  Initial Impression and Ddx Suspicious for small bowel obstruction given the vomiting, decreased bowel movements, abdominal distention.  Past medical/surgical history that increases complexity of ED encounter: CHF  Interpretation of Diagnostics I personally reviewed the EKG and my interpretation is as follows: Sinus rhythm without significant change from prior    Patient Reassessment and Ultimate Disposition/Management     Awaiting CT, signed out to oncoming provider at shift change.  Patient management required discussion with the following services or consulting groups:  None  Complexity of Problems Addressed Acute illness or injury that poses threat of life of bodily function  Additional Data Reviewed and Analyzed Further history obtained from: EMS on arrival  Additional Factors Impacting ED Encounter Risk Consideration of hospitalization  Ozell HERO. Theadore, MD [facility] Emergency Medicine [facility] [email or web address]  Final Clinical Impressions(s) / ED Diagnoses  No diagnosis found.  ED Discharge Orders     None        Discharge Instructions Discussed with and Provided to Patient:   Discharge Instructions   None      Theadore Ozell HERO, MD 11/30/23 [phone]

## 2023-11-30 NOTE — ED Provider Notes (Signed)
 I have examined this patient a couple of times during the course of their ER stay after change of shift, this patient has a urinary tract infection, we had to use a catheter to obtain a urine sample because of urinary retention, the Foley catheter was left in place secondary to the urinary retention to avoid worsening abdominal pain or renal dysfunction.  I suspect that the abdominal pain was secondary to the urinary retention as the CT scan was reassuring and showed no other acute findings.  She does have a leukocytosis but no fever no hypotension and no tachycardia at this time.  She has not vomited since she has been here, Rocephin  given, cephalexin  for home, she is in a nursing facility where this can be managed, culture sent   Cleotilde Rogue, MD 11/30/23 1026

## 2023-11-30 NOTE — ED Notes (Signed)
[facility] called for transport.  

## 2023-11-30 NOTE — Discharge Instructions (Addendum)
 It appears that the CT scan confirmed that your bladder was distended and you were not able to urinate, we had to put a catheter in to relieve this obstruction, there was an infection that was probably causing some of this as well.  We have given you a dose of antibiotics here but you will need to continue to take a medication called cephalexin  3 times a day for the next 7 days.  You will need to have your family doctor evaluate you for removal of the catheter after the infection is treated  Please have your family doctor recheck you within 48 hours, return to the ER for fevers vomiting or worsening symptoms including fast heart rate weakness or anything that is concerning

## 2023-11-30 NOTE — ED Notes (Signed)
[facility] came and transported pt to [facility]. Report had been called and nurse verbalized understanding of discharge instructions.

## 2023-12-01 ENCOUNTER — Emergency Department (HOSPITAL_COMMUNITY): Payer: Medicare Other

## 2023-12-01 ENCOUNTER — Other Ambulatory Visit: Payer: Self-pay

## 2023-12-01 ENCOUNTER — Emergency Department (HOSPITAL_COMMUNITY)
Admission: EM | Admit: 2023-12-01 | Discharge: 2024-01-01 | Disposition: E | Payer: Medicare Other | Attending: Emergency Medicine | Admitting: Emergency Medicine

## 2023-12-01 ENCOUNTER — Encounter (HOSPITAL_COMMUNITY): Payer: Self-pay | Admitting: Emergency Medicine

## 2023-12-01 DIAGNOSIS — K920 Hematemesis: Secondary | ICD-10-CM | POA: Diagnosis not present

## 2023-12-01 DIAGNOSIS — R0682 Tachypnea, not elsewhere classified: Secondary | ICD-10-CM | POA: Insufficient documentation

## 2023-12-01 DIAGNOSIS — R140 Abdominal distension (gaseous): Secondary | ICD-10-CM | POA: Insufficient documentation

## 2023-12-01 DIAGNOSIS — R1110 Vomiting, unspecified: Secondary | ICD-10-CM | POA: Diagnosis present

## 2023-12-01 DIAGNOSIS — R1084 Generalized abdominal pain: Secondary | ICD-10-CM | POA: Insufficient documentation

## 2023-12-01 DIAGNOSIS — R000 Tachycardia, unspecified: Secondary | ICD-10-CM | POA: Insufficient documentation

## 2023-12-01 MED ORDER — ONDANSETRON HCL 4 MG/2ML IJ SOLN
4.0000 mg | Freq: Once | INTRAMUSCULAR | Status: AC
  Administered 2023-12-01: 4 mg via INTRAVENOUS

## 2023-12-01 MED ORDER — LACTATED RINGERS IV BOLUS (SEPSIS)
1000.0000 mL | Freq: Once | INTRAVENOUS | Status: AC
  Administered 2023-12-02: 1000 mL via INTRAVENOUS

## 2023-12-01 MED ORDER — ONDANSETRON HCL 4 MG/2ML IJ SOLN
INTRAMUSCULAR | Status: AC
  Filled 2023-12-01: qty 2

## 2023-12-01 NOTE — ED Triage Notes (Signed)
 Per [facility], pt from [place], pt has c/o of vomiting blood all day.  No pain, but is now altered.    80% on 6L [place] (1.5L home O2) HR 100 100pal BG 196

## 2023-12-02 DIAGNOSIS — K920 Hematemesis: Secondary | ICD-10-CM | POA: Diagnosis not present

## 2023-12-02 LAB — URINALYSIS, W/ REFLEX TO CULTURE (INFECTION SUSPECTED)
Bilirubin Urine: NEGATIVE
Glucose, UA: NEGATIVE mg/dL
Ketones, ur: 5 mg/dL — AB
Nitrite: POSITIVE — AB
Protein, ur: 30 mg/dL — AB
Specific Gravity, Urine: 1.024 (ref 1.005–1.030)
WBC, UA: >50 WBC/hpf (ref 0–5)
pH: 5 (ref 5.0–8.0)

## 2023-12-02 LAB — CBC
HCT: 41.7 % (ref 36.0–46.0)
Hemoglobin: 13.2 g/dL (ref 12.0–15.0)
MCH: 30.5 pg (ref 26.0–34.0)
MCHC: 31.7 g/dL (ref 30.0–36.0)
MCV: 96.3 fL (ref 80.0–100.0)
Platelets: 443 10*3/uL — ABNORMAL HIGH (ref 150–400)
RBC: 4.33 MIL/uL (ref 3.87–5.11)
RDW: 13.3 % (ref 11.5–15.5)
WBC: 21.1 10*3/uL — ABNORMAL HIGH (ref 4.0–10.5)
nRBC: 0.1 % (ref 0.0–0.2)

## 2023-12-02 LAB — COMPREHENSIVE METABOLIC PANEL
ALT: 13 U/L (ref 0–44)
AST: 45 U/L — ABNORMAL HIGH (ref 15–41)
Albumin: 3 g/dL — ABNORMAL LOW (ref 3.5–5.0)
Alkaline Phosphatase: 86 U/L (ref 38–126)
Anion gap: 18 — ABNORMAL HIGH (ref 5–15)
BUN: 58 mg/dL — ABNORMAL HIGH (ref 8–23)
CO2: 36 mmol/L — ABNORMAL HIGH (ref 22–32)
Calcium: 9.3 mg/dL (ref 8.9–10.3)
Chloride: 90 mmol/L — ABNORMAL LOW (ref 98–111)
Creatinine, Ser: 1.63 mg/dL — ABNORMAL HIGH (ref 0.44–1.00)
GFR, Estimated: 29 mL/min — ABNORMAL LOW (ref >60)
Glucose, Bld: 175 mg/dL — ABNORMAL HIGH (ref 70–99)
Potassium: 5.3 mmol/L — ABNORMAL HIGH (ref 3.5–5.1)
Sodium: 144 mmol/L (ref 135–145)
Total Bilirubin: 1.8 mg/dL — ABNORMAL HIGH (ref 0.0–1.2)
Total Protein: 6.6 g/dL (ref 6.5–8.1)

## 2023-12-02 LAB — APTT: aPTT: 29 s (ref 24–36)

## 2023-12-02 LAB — I-STAT CG4 LACTIC ACID, ED
Lactic Acid, Venous: 5.7 mmol/L (ref 0.5–1.9)
Lactic Acid, Venous: >15 mmol/L (ref 0.5–1.9)

## 2023-12-02 LAB — TROPONIN I (HIGH SENSITIVITY): Troponin I (High Sensitivity): 1337 ng/L (ref <18)

## 2023-12-02 LAB — TYPE AND SCREEN

## 2023-12-02 LAB — PROTIME-INR
INR: 1.7 — ABNORMAL HIGH (ref 0.8–1.2)
Prothrombin Time: 20.4 s — ABNORMAL HIGH (ref 11.4–15.2)

## 2023-12-02 MED ORDER — FENTANYL CITRATE PF 50 MCG/ML IJ SOSY
25.0000 ug | PREFILLED_SYRINGE | INTRAMUSCULAR | Status: DC | PRN
  Administered 2023-12-02 (×4): 25 ug via INTRAVENOUS
  Filled 2023-12-02 (×5): qty 1

## 2023-12-02 MED ORDER — METOCLOPRAMIDE HCL 5 MG/ML IJ SOLN
10.0000 mg | Freq: Once | INTRAMUSCULAR | Status: AC
  Administered 2023-12-02: 10 mg via INTRAVENOUS
  Filled 2023-12-02: qty 2

## 2023-12-03 LAB — URINE CULTURE: Culture: >=100000 — AB

## 2023-12-04 ENCOUNTER — Telehealth (HOSPITAL_BASED_OUTPATIENT_CLINIC_OR_DEPARTMENT_OTHER): Payer: Self-pay | Admitting: *Deleted

## 2023-12-05 LAB — URINE CULTURE: Culture: >=100000 — AB

## 2023-12-07 LAB — CULTURE, BLOOD (ROUTINE X 2)
Culture: NO GROWTH
Culture: NO GROWTH

## 2024-01-01 NOTE — ED Provider Notes (Addendum)
  EMERGENCY DEPARTMENT AT [facility] Provider Note   CSN: [id number] Arrival date & time: 12/01/23  2316     History  Chief Complaint  Patient presents with   Hematemesis    [name] is a 89 y.o. female.  Patient was sent to the emergency department for evaluation of recurrent vomiting.  Patient has reportedly been vomiting through most of the day, now vomiting what appears to be blood like substance.       Home Medications Prior to Admission medications   Medication Sig Start Date End Date Taking? Authorizing Provider  acetaminophen  (TYLENOL ) 500 MG tablet Take 1,000 mg by mouth 2 (two) times daily.   Yes [provider]  cefTRIAXone  (ROCEPHIN ) 1 g injection Inject 1 g into the muscle daily. For 7days   Yes [provider]  Dextrose -Sodium Chloride  (DEXTROSE  5 % AND 0.45% NACL) infusion Inject 75 mL/hr into the vein continuous. Infuse at 47mL/hr x2L and d/c when completed   Yes [provider]  estradiol (ESTRACE) 0.1 MG/GM vaginal cream Place 0.5 g vaginally 3 (three) times a week. Monday, Wednesday, Friday. Administer at night.   Yes [provider]  famotidine  (PEPCID ) 20 MG tablet Take 20 mg by mouth daily. 07/26/23  Yes [provider]  fluticasone (FLONASE) 50 MCG/ACT nasal spray Place 1 spray into both nostrils 2 (two) times daily.   Yes [provider]  gabapentin  (NEURONTIN ) 300 MG capsule Take 300 mg by mouth every morning.   Yes [provider]  Lidocaine  HCl 4 % CREA Apply 1 application topically 2 (two) times daily. Apply to lower back for back pain   Yes [provider]  melatonin 3 MG TABS tablet Take 3 mg by mouth at bedtime.   Yes [provider]  ondansetron  (ZOFRAN ) 4 MG tablet Take 4 mg by mouth every 6 (six) hours as needed for nausea or vomiting.   Yes [provider]  polyethylene glycol (MIRALAX  / GLYCOLAX ) packet Take 17 g by mouth daily.  06/24/18  Yes Tonnie George, PA-C  promethazine  (PHENERGAN ) 25 MG/ML injection Inject 25 mg into the muscle daily as needed for nausea or vomiting.   Yes [provider]  senna-docusate (SENOKOT-S) 8.6-50 MG tablet Take 1 tablet by mouth at bedtime.   Yes [provider]  cephALEXin  (KEFLEX ) 500 MG capsule Take 1 capsule (500 mg total) by mouth 3 (three) times daily. Patient not taking: Reported on 2023-12-17 11/30/23   Cleotilde Rogue, MD      Allergies    Aspirin, Boniva [ibandronic acid], Morphine , Nitrofurantoin, Penicillins, and Sulfonamide derivatives    Review of Systems   Review of Systems  Physical Exam Updated Vital Signs BP (!) 93/51   Pulse (!) 0   Temp 97.7 F (36.5 C) (Oral)   Resp (!) 0   Ht 5' 1 (1.549 m)   SpO2 (!) 83%   BMI 29.33 kg/m  Physical Exam HENT:     Head: Normocephalic.     Comments: Delores substance noted on the lips    Mouth/Throat:     Mouth: Mucous membranes are dry.  Eyes:     Pupils: Pupils are equal, round, and reactive to light.  Cardiovascular:     Rate and Rhythm: Tachycardia present.  Pulmonary:     Effort: Tachypnea and accessory muscle usage present.     Breath sounds: Rhonchi present.  Abdominal:     General: Bowel sounds are absent. There is distension.  Tenderness: There is generalized abdominal tenderness.  Musculoskeletal:     Cervical back: Neck supple.  Skin:    General: Skin is warm.  Neurological:     General: No focal deficit present.     Mental Status: She is lethargic and confused.     Cranial Nerves: Cranial nerves 2-12 are intact.     Sensory: Sensation is intact.     Motor: Motor function is intact.     ED Results / Procedures / Treatments   Labs (all labs ordered are listed, but only abnormal results are displayed) Labs Reviewed  COMPREHENSIVE METABOLIC PANEL - Abnormal; Notable for the following components:      Result Value   Potassium 5.3 (*)    Chloride 90 (*)    CO2 36 (*)     Glucose, Bld 175 (*)    BUN 58 (*)    Creatinine, Ser 1.63 (*)    Albumin 3.0 (*)    AST 45 (*)    Total Bilirubin 1.8 (*)    GFR, Estimated 29 (*)    Anion gap 18 (*)    All other components within normal limits  CBC - Abnormal; Notable for the following components:   WBC 21.1 (*)    Platelets 443 (*)    All other components within normal limits  URINALYSIS, W/ REFLEX TO CULTURE (INFECTION SUSPECTED) - Abnormal; Notable for the following components:   Color, Urine AMBER (*)    APPearance CLOUDY (*)    Hgb urine dipstick SMALL (*)    Ketones, ur 5 (*)    Protein, ur 30 (*)    Nitrite POSITIVE (*)    Leukocytes,Ua LARGE (*)    Bacteria, UA MANY (*)    Non Squamous Epithelial 0-5 (*)    All other components within normal limits  PROTIME-INR - Abnormal; Notable for the following components:   Prothrombin Time 20.4 (*)    INR 1.7 (*)    All other components within normal limits  I-STAT CG4 LACTIC ACID, ED - Abnormal; Notable for the following components:   Lactic Acid, Venous 5.7 (*)    All other components within normal limits  I-STAT CG4 LACTIC ACID, ED - Abnormal; Notable for the following components:   Lactic Acid, Venous >15.0 (*)    All other components within normal limits  TROPONIN I (HIGH SENSITIVITY) - Abnormal; Notable for the following components:   Troponin I (High Sensitivity) 1,337 (*)    All other components within normal limits  CULTURE, BLOOD (ROUTINE X 2)  CULTURE, BLOOD (ROUTINE X 2)  URINE CULTURE  APTT  TYPE AND SCREEN    EKG EKG Interpretation Date/Time:  Wednesday December 01 2023 23:25:32 EST Ventricular Rate:  173 PR Interval:    QRS Duration:  90 QT Interval:  319 QTC Calculation: 542 R Axis:   -59  Text Interpretation: Atrial fibrillation with rapid V-rate Ventricular premature complex Abnormal R-wave progression, late transition Inferior infarct, old Confirmed by Haze Lonni PARAS [id number]) on 12/21/23 12:20:54 AM  Radiology DG  Abd Portable 1 View Addendum Date: 12/01/2023 ADDENDUM REPORT: 12/01/2023 23:55 ADDENDUM: Also noted hernia repair surgical tacks. These results were called by telephone at the time of interpretation on 12/01/2023 at 11:55 pm to provider [facility] , who verbally acknowledged these results. Electronically Signed   By: Morgane  Naveau M.D.   On: 12/01/2023 23:55   Result Date: 12/01/2023 CLINICAL DATA:  Vomiting, hematemesis, abdominal distension EXAM: PORTABLE ABDOMEN - 1 VIEW COMPARISON:  CT abdomen pelvis 11/30/2023 FINDINGS: Limited evaluation due to overlapping osseous structures and overlying soft tissues. Gaseous dilatation of the stomach and several loops of the small bowel. Question Rigler sign suggestive of possible pneumoperitoneum. Limited evaluation for free intraperitoneal gas on supine radiograph. No radio-opaque calculi or other significant radiographic abnormality are seen. IMPRESSION: 1. Gaseous dilatation of the stomach and several loops of the small bowel. 2. Question Rigler sign suggestive of possible pneumoperitoneum. Limited evaluation for free intraperitoneal gas on supine radiograph. 3. Recommend CT abdomen pelvis with intravenous contrast for further evaluation. Electronically Signed: By: Morgane  Naveau M.D. On: 12/01/2023 23:50   DG Chest Port 1 View Result Date: 12/01/2023 CLINICAL DATA:  Possible sepsis EXAM: PORTABLE CHEST 1 VIEW COMPARISON:  Film from the previous day. FINDINGS: Cardiac shadow is stable. Aortic calcifications are noted. The lungs are well aerated bilaterally. No focal infiltrate or effusion is seen. Postsurgical changes in the right shoulder are noted. No acute bony abnormality is noted. IMPRESSION: No acute abnormality noted. Electronically Signed   By: Oneil Devonshire M.D.   On: 12/01/2023 23:47    Procedures Procedures    Medications Ordered in ED Medications  fentaNYL  (SUBLIMAZE ) injection 25 mcg (25 mcg Intravenous Given Dec 13, 2023 0606)  lactated  ringers  bolus 1,000 mL (0 mLs Intravenous Stopped 12-13-23 0100)  ondansetron  (ZOFRAN ) injection 4 mg (4 mg Intravenous Given 12/01/23 2325)  metoCLOPramide  (REGLAN ) injection 10 mg (10 mg Intravenous Given 12-13-23 0030)    ED Course/ Medical Decision Making/ A&P                                 Medical Decision Making Amount and/or Complexity of Data Reviewed Labs: ordered. Radiology: ordered.  Risk Prescription drug management.   Presents to the emergency department for evaluation of nausea, vomiting, possibly GI bleeding.  Patient was seen in the ED yesterday for similar symptoms.  She was diagnosed with a urinary tract infection.  Extensive workup yesterday including CT abdomen and pelvis did not show any other acute abnormalities.  Family reports that the vomiting stopped after she was seen in the ED but then started again today.  EMS reports that when they picked her up she was alert and bright, but during transport she vomited a couple of more times and then mental status deteriorated.  At arrival to the ED, her heart rate suddenly became fast and irregular and her blood pressure dropped.  She was noted to be in atrial fibrillation with a rapid ventricular response.  Status was identified.  She is a DNR/DNI.  Daughter is at bedside, reports that her mother has been very clear that she just wants to be kept comfortable.  Did have multiple conversations with the daughter.  She is the patient's power of attorney.  She wants comfort measures.  No surgery, no antibiotics, no invasive procedures.  Patient will be continued on IV meds and as needed pain meds.  Initial findings are what appears to be pneumonia, likely aspiration.  Patient with dilated stomach and bowel on plain film with possible free air.  Findings were discussed with the patient's family, they agree she would not want any surgery, no further investigation.  Patient to be COMFORT MEASURES.  Addendum 7:46 AM -patient  bradycardic and then lost pulses.  She was declared dead at 7:38 AM.  Her daughter, Avis Tirone ([phone]) was contacted by phone and informed of the patient's death.  Patient  is to be cremated.  Family has information available.  They do not wish to view her body.      Final Clinical Impression(s) / ED Diagnoses Final diagnoses:  Hematemesis with nausea    Rx / DC Orders ED Discharge Orders     None         Betha Shadix, Lonni PARAS, MD 12/08/2023 0215    Haze Lonni PARAS, MD December 08, 2023 [phone]

## 2024-01-01 NOTE — ED Notes (Signed)
 The pt remains alert but is coughing up dark colored sputum almost continuously heart rate has slowed down for the past 1 1/2 hours she remains on 5 liters of nasal 02 sats up and down but when the 02 is off her sats drop into the high 70s

## 2024-01-01 NOTE — ED Notes (Signed)
 Pt unresponsive, pupils fixed, apneic, pulseless; no cardiac activity on ultrasound (performed by Dr. Blinda Leatherwood); time of death [phone]

## 2024-01-01 NOTE — ED Notes (Signed)
 Assumed care on patent , repositioned for comfort , family at bedside . Fentanyl given for comfort .

## 2024-01-01 NOTE — ED Notes (Signed)
 The pt continues to vomit  small amounts of dark liquid  this past time the amount was a larger amount and the pt is getting more tired  and she said that she just wants to die.  Her daughter remains at the bedside

## 2024-01-01 NOTE — ED Notes (Signed)
 Pt unresponsive, bradycardic, apneic; this RN and Dr. Blinda Leatherwood at bedside

## 2024-01-01 NOTE — ED Notes (Signed)
 Was told labs no longer needed earlier per RN

## 2024-01-01 NOTE — Progress Notes (Signed)
 Patient was deceased before she could be evaluated for admission. Patient was pronounced dead at 7:38 AM by ED provider, Dr. Blinda Leatherwood. Daughter was informed of patient's death.

## 2024-01-01 NOTE — ED Notes (Signed)
 ED TO INPATIENT HANDOFF REPORT  ED Nurse Name and Phone #: medford [phone]  S Name/Age/Gender [name] 89 y.o. female Room/Bed: 029C/029C  Code Status   Code Status: Do not attempt resuscitation (DNR) - Comfort care  Home/SNF/Other Skilled nursing facility Patient oriented to: self Is this baseline? Yes   Triage Complete: Triage complete  Chief Complaint Gi bleed  Triage Note Per [facility], pt from [place], pt has c/o of vomiting blood all day.  No pain, but is now altered.    80% on 6L [place] (1.5L home O2) HR 100 100pal BG 196     Allergies Allergies  Allergen Reactions   Aspirin Other (See Comments)    Unknown - On MAR   Boniva [Ibandronic Acid] Other (See Comments)    Unknown - On MAR   Morphine  Other (See Comments)    Unknown - On MAR   Nitrofurantoin Other (See Comments)    Unknown - On MAR   Penicillins Other (See Comments)    On MAR  Received Ancef  without problem  Has patient had a PCN reaction causing immediate rash, facial/tongue/throat swelling, SOB or lightheadedness with hypotension: Unknown Has patient had a PCN reaction causing severe rash involving mucus membranes or skin necrosis: Unknown Has patient had a PCN reaction that required hospitalization: Unknown Has patient had a PCN reaction occurring within the last 10 years: Unknown If all of the above answers are NO, then may proceed with Cephalosporin use.   Sulfonamide Derivatives Other (See Comments)    Unknown - On MAR    Level of Care/Admitting Diagnosis ED Disposition     None       B Medical/Surgery History Past Medical History:  Diagnosis Date   Actinic keratosis    Allergic rhinitis    Back pain    Chronic constipation    Chronic diastolic CHF (congestive heart failure) (HCC) 06/03/2018   GERD (gastroesophageal reflux disease)    Hyperlipidemia    Hypertension    Hyponatremia    Leg edema    Macular degeneration    Mitral regurgitation    mild by echo 2003    Osteoarthritis    Osteoarthrosis    Osteoporosis    Tenosynovitis    myxomatous   Past Surgical History:  Procedure Laterality Date   CARPAL TUNNEL RELEASE     bilateral   ESOPHAGOGASTRODUODENOSCOPY (EGD) WITH PROPOFOL  N/A 06/05/2018   Procedure: ESOPHAGOGASTRODUODENOSCOPY (EGD) WITH PROPOFOL ;  Surgeon: Elicia Claw, MD;  Location: [facility];  Service: Gastroenterology;  Laterality: N/A;   hernial repain     HIATAL HERNIA REPAIR N/A 06/07/2018   Procedure: LAPAROSCOPIC REPAIR OF HIATAL HERNIA;  Surgeon: Ethyl Lenis, MD;  Location: [facility] OR;  Service: General;  Laterality: N/A;   INGUINAL HERNIA REPAIR     laparoscopic preperitoneal repair of bilateral inguinal hernias   IR GJ TUBE CHANGE  06/20/2018   IR REPLC GASTRO/COLONIC TUBE PERCUT W/FLUORO  08/29/2018   LAPAROSCOPIC GASTROSTOMY N/A 06/07/2018   Procedure: LAPAROSCOPIC GASTROSTOMY;  Surgeon: Ethyl Lenis, MD;  Location: [facility] OR;  Service: General;  Laterality: N/A;   REPLACEMENT TOTAL KNEE BILATERAL     REVERSE SHOULDER ARTHROPLASTY Right 05/08/2014   Procedure: REVERSE SHOULDER ARTHROPLASTY RIGHT ;  Surgeon: Franky CHRISTELLA Pointer, MD;  Location: [facility] OR;  Service: Orthopedics;  Laterality: Right;   SYNOVECTOMY     of right long finger   tonisellectomy     tonisillectomy     TONSILLECTOMY       A  IV Location/Drains/Wounds Patient Lines/Drains/Airways Status     Active Line/Drains/Airways     Name Placement date Placement time Site Days   Peripheral IV 11/30/23 20 G Anterior;Left Forearm 11/30/23  0409  Forearm  2   Peripheral IV 12/01/23 22 G 1 Right Forearm 12/01/23  2326  Forearm  1   Peripheral IV 12/01/23 22 G 1 Right Forearm 12/01/23  2326  Forearm  1   Peripheral IV 12/01/23 20 G Right Antecubital 12/01/23  2356  Antecubital  1   Gastrostomy/Enterostomy Gastrostomy;Jejunostomy 20 Fr. LUQ 08/29/18  1154  LUQ  1921   Urethral Catheter Amanda RN Double-lumen 14 Fr. 11/30/23  0924  Double-lumen  2             Intake/Output Last 24 hours  Intake/Output Summary (Last 24 hours) at 12/28/23 0227 Last data filed at 12-28-23 0053 Gross per 24 hour  Intake 1000 ml  Output --  Net 1000 ml    Labs/Imaging Results for orders placed or performed during the hospital encounter of 12/01/23 (from the past 48 hours)  Type and screen [facility]     Status: None   Collection Time: 12/01/23 11:29 PM  Result Value Ref Range   ABO/RH(D) O POS    Antibody Screen NEG    Sample Expiration      12/04/2023,2359 Performed at [facility] Lab, [address], [place], [place] [address]   Comprehensive metabolic panel     Status: Abnormal   Collection Time: 12/01/23 11:30 PM  Result Value Ref Range   Sodium 144 135 - 145 mmol/L   Potassium 5.3 (H) 3.5 - 5.1 mmol/L    Comment: HEMOLYSIS AT THIS LEVEL MAY AFFECT RESULT   Chloride 90 (L) 98 - 111 mmol/L   CO2 36 (H) 22 - 32 mmol/L   Glucose, Bld 175 (H) 70 - 99 mg/dL    Comment: Glucose reference range applies only to samples taken after fasting for at least 8 hours.   BUN 58 (H) 8 - 23 mg/dL   Creatinine, Ser 8.36 (H) 0.44 - 1.00 mg/dL   Calcium  9.3 8.9 - 10.3 mg/dL   Total Protein 6.6 6.5 - 8.1 g/dL   Albumin 3.0 (L) 3.5 - 5.0 g/dL   AST 45 (H) 15 - 41 U/L    Comment: HEMOLYSIS AT THIS LEVEL MAY AFFECT RESULT   ALT 13 0 - 44 U/L    Comment: HEMOLYSIS AT THIS LEVEL MAY AFFECT RESULT   Alkaline Phosphatase 86 38 - 126 U/L    Comment: HEMOLYSIS AT THIS LEVEL MAY AFFECT RESULT   Total Bilirubin 1.8 (H) 0.0 - 1.2 mg/dL    Comment: HEMOLYSIS AT THIS LEVEL MAY AFFECT RESULT   GFR, Estimated 29 (L) >60 mL/min    Comment: (NOTE) Calculated using the CKD-EPI Creatinine Equation (2021)    Anion gap 18 (H) 5 - 15    Comment: Performed at [facility] Lab, [address], [place], [place] [address]  CBC     Status: Abnormal   Collection Time: 12/01/23 11:30 PM  Result Value Ref Range   WBC 21.1 (H) 4.0 - 10.5 K/uL   RBC 4.33 3.87 -  5.11 MIL/uL   Hemoglobin 13.2 12.0 - 15.0 g/dL   HCT 58.2 63.9 - 53.9 %   MCV 96.3 80.0 - 100.0 fL   MCH 30.5 26.0 - 34.0 pg   MCHC 31.7 30.0 - 36.0 g/dL   RDW 86.6 88.4 -  15.5 %   Platelets 443 (H) 150 - 400 K/uL   nRBC 0.1 0.0 - 0.2 %    Comment: Performed at [facility], [address], [place], [place] [address]  Troponin I (High Sensitivity)     Status: Abnormal   Collection Time: 12/01/23 11:30 PM  Result Value Ref Range   Troponin I (High Sensitivity) 1,337 (HH) <18 ng/L    Comment: CRITICAL RESULT CALLED TO, READ BACK BY AND VERIFIED WITH Payeton Germani, RN @0024  19-Dec-2023 SATRAINR (NOTE) Elevated high sensitivity troponin I (hsTnI) values and significant  changes across serial measurements may suggest ACS but many other  chronic and acute conditions are known to elevate hsTnI results.  Refer to the Links section for chest pain algorithms and additional  guidance. Performed at [facility], [address] [place], [place] [address]   I-Stat Lactic Acid, ED     Status: Abnormal   Collection Time: 12/01/23 11:59 PM  Result Value Ref Range   Lactic Acid, Venous 5.7 (HH) 0.5 - 1.9 mmol/L   Comment NOTIFIED PHYSICIAN   Protime-INR     Status: Abnormal   Collection Time: 12/19/23 12:05 AM  Result Value Ref Range   Prothrombin Time 20.4 (H) 11.4 - 15.2 seconds   INR 1.7 (H) 0.8 - 1.2    Comment: (NOTE) INR goal varies based on device and disease states. Performed at [facility] Lab, [address] [place], [place] [address]   APTT     Status: None   Collection Time: 12/19/23 12:05 AM  Result Value Ref Range   aPTT 29 24 - 36 seconds    Comment: Performed at [facility] Lab, [address], [place], [place] [address]  I-Stat Lactic Acid, ED     Status: Abnormal   Collection Time: December 19, 2023 12:56 AM  Result Value Ref Range   Lactic Acid, Venous >15.0 (HH) 0.5 - 1.9 mmol/L   Comment NOTIFIED PHYSICIAN    DG Abd Portable 1 View Addendum Date:  12/01/2023 ADDENDUM REPORT: 12/01/2023 23:55 ADDENDUM: Also noted hernia repair surgical tacks. These results were called by telephone at the time of interpretation on 12/01/2023 at 11:55 pm to provider [facility] , who verbally acknowledged these results. Electronically Signed   By: Morgane  Naveau M.D.   On: 12/01/2023 23:55   Result Date: 12/01/2023 CLINICAL DATA:  Vomiting, hematemesis, abdominal distension EXAM: PORTABLE ABDOMEN - 1 VIEW COMPARISON:  CT abdomen pelvis 11/30/2023 FINDINGS: Limited evaluation due to overlapping osseous structures and overlying soft tissues. Gaseous dilatation of the stomach and several loops of the small bowel. Question Rigler sign suggestive of possible pneumoperitoneum. Limited evaluation for free intraperitoneal gas on supine radiograph. No radio-opaque calculi or other significant radiographic abnormality are seen. IMPRESSION: 1. Gaseous dilatation of the stomach and several loops of the small bowel. 2. Question Rigler sign suggestive of possible pneumoperitoneum. Limited evaluation for free intraperitoneal gas on supine radiograph. 3. Recommend CT abdomen pelvis with intravenous contrast for further evaluation. Electronically Signed: By: Morgane  Naveau M.D. On: 12/01/2023 23:50   DG Chest Port 1 View Result Date: 12/01/2023 CLINICAL DATA:  Possible sepsis EXAM: PORTABLE CHEST 1 VIEW COMPARISON:  Film from the previous day. FINDINGS: Cardiac shadow is stable. Aortic calcifications are noted. The lungs are well aerated bilaterally. No focal infiltrate or effusion is seen. Postsurgical changes in the right shoulder are noted. No acute bony abnormality is noted. IMPRESSION: No acute abnormality noted. Electronically Signed  By: Oneil Devonshire M.D.   On: 12/01/2023 23:47   CT ABDOMEN PELVIS W CONTRAST Result Date: 11/30/2023 CLINICAL DATA:  Palate struck shin suspected.  Emesis for 2 days. EXAM: CT ABDOMEN AND PELVIS WITH CONTRAST TECHNIQUE: Multidetector CT imaging  of the abdomen and pelvis was performed using the standard protocol following bolus administration of intravenous contrast. RADIATION DOSE REDUCTION: This exam was performed according to the departmental dose-optimization program which includes automated exposure control, adjustment of the mA and/or kV according to patient size and/or use of iterative reconstruction technique. CONTRAST:  50mL OMNIPAQUE  IOHEXOL  350 MG/ML SOLN COMPARISON:  CT of the abdomen and pelvis 12/14/2021 FINDINGS: Lower chest: Bibasilar atelectasis is present. Heart size is normal. Dense coronary artery calcifications are present. No significant pleural or pericardial effusion is present. Hepatobiliary: Mild intrahepatic biliary dilation is present. Common bile duct is dilated to 12 mm. No obstructing lesion is evident. No discrete liver lesions are present. Pancreas: Unremarkable. No pancreatic ductal dilatation or surrounding inflammatory changes. Spleen: Normal in size without focal abnormality. Adrenals/Urinary Tract: Adrenal glands are normal bilaterally. Two 3 mm nonobstructing stones are present at the upper pole of the left kidney. Ureter is mildly dilated without obstruction. Normal renal perfusion is present bilaterally. The urinary bladder is moderately distended. A left sided cystocele is again noted, measuring 7 cm maximally. Stomach/Bowel: Fluid is layering within the stomach and duodenum. No obstruction is present. Small bowel is unremarkable. Terminal ileum is within normal limits. Appendix is not discretely visualized. No inflammatory changes are present in the region. The ascending and transverse colon are within normal limits. Moderate stool is present throughout the descending and sigmoid colon without significant distention or obstruction. Vascular/Lymphatic: Atherosclerotic calcifications are present the aorta branch vessels. No aneurysm is present. No significant adenopathy is present. Reproductive: Uterus and bilateral  adnexa are unremarkable. Other: A 15 mm paraumbilical hernia contains without bowel. No other significant ventral hernias are present. Mesh repair is present over the lower ventral abdomen and pelvis. No free fluid or free air is present. Musculoskeletal: Levoconvex curvature of the lumbar spine is centered at L1-2. Lateral listhesis is again noted at L3-4. No focal osseous lesions are present. Left total hip arthroplasty is noted. Bony pelvis is otherwise unremarkable. IMPRESSION: 1. No acute or focal lesion to explain the patient's symptoms. 2. Mild intrahepatic biliary dilation with dilation of the common bile duct to 12 mm. These findings are chronic. 3. Two 3 mm nonobstructing stones at the upper pole of the left kidney. 4. 7 cm left-sided cystocele is again noted. 5. 15 mm paraumbilical hernia contains without bowel. 6. Dense coronary artery calcifications. 7.  Aortic Atherosclerosis (ICD10-I70.0). Electronically Signed   By: Lonni Necessary M.D.   On: 11/30/2023 07:22   DG Chest Port 1 View Result Date: 11/30/2023 CLINICAL DATA:  Chest pain. EXAM: PORTABLE CHEST 1 VIEW COMPARISON:  One-view chest x-ray 12/14/2021. FINDINGS: Heart is enlarged. Atherosclerotic changes are present at the aortic arch. Lung volumes are low. Mild pulmonary vascular congestion is present. No focal airspace consolidation is present. Right shoulder arthroplasty is again noted. Progressive advanced degenerative changes are present in the left shoulder. Thoracic scoliosis is again noted. IMPRESSION: 1. Cardiomegaly and mild pulmonary vascular congestion without frank edema. 2. Progressive advanced degenerative changes in the left shoulder. Electronically Signed   By: Lonni Necessary M.D.   On: 11/30/2023 07:09    Pending Labs Unresulted Labs (From admission, onward)     Start  Ordered   12/19/2023 0100  Brain natriuretic peptide  Once,   R        Dec 19, 2023 0100   12/01/23 2322  Urinalysis, w/ Reflex to Culture  (Infection Suspected) -Urine, Catheterized  (Undifferentiated presentation (screening labs and basic nursing orders))  ONCE - URGENT,   URGENT       Question:  Specimen Source  Answer:  Urine, Catheterized   12/01/23 2322   12/01/23 2321  Blood Culture (routine x 2)  (Undifferentiated presentation (screening labs and basic nursing orders))  BLOOD CULTURE X 2,   STAT      12/01/23 2322            Vitals/Pain Today's Vitals   Dec 19, 2023 0126 12-19-23 0129 Dec 19, 2023 0130 Dec 19, 2023 0224  BP:   93/64   Pulse: 90 (!) 111 (!) 42   Resp: (!) 23 (!) 26 (!) 26   SpO2: (!) 84% 91% 92%   PainSc:    10-Worst pain ever    Isolation Precautions No active isolations  Medications Medications  ondansetron  (ZOFRAN ) 4 MG/2ML injection (has no administration in time range)  fentaNYL  (SUBLIMAZE ) injection 25 mcg (25 mcg Intravenous Given December 19, 2023 0222)  lactated ringers  bolus 1,000 mL (0 mLs Intravenous Stopped 19-Dec-2023 0100)  ondansetron  (ZOFRAN ) injection 4 mg (4 mg Intravenous Given 12/01/23 2325)  metoCLOPramide  (REGLAN ) injection 10 mg (10 mg Intravenous Given 12/19/2023 0030)    Mobility non-ambulatory     Focused Assessments Cardiac Assessment Handoff:    Lab Results  Component Value Date   TROPONINI 0.11 (HH) 06/03/2018   No results found for: DDIMER Does the Patient currently have chest pain? No    R Recommendations: See Admitting Provider Note  Report given to:   Additional Notes: 2

## 2024-01-01 DEATH — deceased
